# Patient Record
Sex: Male | Born: 1950 | Race: White | Hispanic: No | Marital: Single | Smoking: Never smoker
Health system: Southern US, Community
[De-identification: ages and names within clinical notes are randomized; demographics above are authoritative.]

## PROBLEM LIST (undated history)

## (undated) DIAGNOSIS — F3289 Other specified depressive episodes: Secondary | ICD-10-CM

## (undated) DIAGNOSIS — E041 Nontoxic single thyroid nodule: Secondary | ICD-10-CM

## (undated) DIAGNOSIS — R911 Solitary pulmonary nodule: Secondary | ICD-10-CM

## (undated) DIAGNOSIS — J309 Allergic rhinitis, unspecified: Secondary | ICD-10-CM

## (undated) DIAGNOSIS — H3530 Unspecified macular degeneration: Secondary | ICD-10-CM

## (undated) DIAGNOSIS — E785 Hyperlipidemia, unspecified: Secondary | ICD-10-CM

## (undated) DIAGNOSIS — E119 Type 2 diabetes mellitus without complications: Secondary | ICD-10-CM

## (undated) DIAGNOSIS — K760 Fatty (change of) liver, not elsewhere classified: Secondary | ICD-10-CM

## (undated) DIAGNOSIS — L720 Epidermal cyst: Secondary | ICD-10-CM

## (undated) DIAGNOSIS — Z860100 Personal history of colon polyps, unspecified: Secondary | ICD-10-CM

## (undated) DIAGNOSIS — I77810 Thoracic aortic ectasia: Secondary | ICD-10-CM

## (undated) DIAGNOSIS — R42 Dizziness and giddiness: Secondary | ICD-10-CM

## (undated) DIAGNOSIS — M5416 Radiculopathy, lumbar region: Principal | ICD-10-CM

## (undated) DIAGNOSIS — K589 Irritable bowel syndrome without diarrhea: Secondary | ICD-10-CM

## (undated) DIAGNOSIS — K5792 Diverticulitis of intestine, part unspecified, without perforation or abscess without bleeding: Secondary | ICD-10-CM

## (undated) DIAGNOSIS — I2510 Atherosclerotic heart disease of native coronary artery without angina pectoris: Secondary | ICD-10-CM

## (undated) DIAGNOSIS — I700 Atherosclerosis of aorta: Secondary | ICD-10-CM

## (undated) DIAGNOSIS — M109 Gout, unspecified: Secondary | ICD-10-CM

## (undated) DIAGNOSIS — N189 Chronic kidney disease, unspecified: Secondary | ICD-10-CM

## (undated) DIAGNOSIS — K219 Gastro-esophageal reflux disease without esophagitis: Secondary | ICD-10-CM

## (undated) DIAGNOSIS — J36 Peritonsillar abscess: Secondary | ICD-10-CM

## (undated) DIAGNOSIS — H9190 Unspecified hearing loss, unspecified ear: Secondary | ICD-10-CM

## (undated) DIAGNOSIS — I10 Essential (primary) hypertension: Secondary | ICD-10-CM

## (undated) DIAGNOSIS — F329 Major depressive disorder, single episode, unspecified: Secondary | ICD-10-CM

## (undated) DIAGNOSIS — Z8719 Personal history of other diseases of the digestive system: Secondary | ICD-10-CM

## (undated) DIAGNOSIS — E079 Disorder of thyroid, unspecified: Secondary | ICD-10-CM

## (undated) DIAGNOSIS — N2889 Other specified disorders of kidney and ureter: Secondary | ICD-10-CM

## (undated) DIAGNOSIS — I739 Peripheral vascular disease, unspecified: Secondary | ICD-10-CM

## (undated) DIAGNOSIS — I509 Heart failure, unspecified: Secondary | ICD-10-CM

## (undated) DIAGNOSIS — K863 Pseudocyst of pancreas: Secondary | ICD-10-CM

## (undated) DIAGNOSIS — S52133A Displaced fracture of neck of unspecified radius, initial encounter for closed fracture: Secondary | ICD-10-CM

## (undated) DIAGNOSIS — F411 Generalized anxiety disorder: Secondary | ICD-10-CM

## (undated) DIAGNOSIS — I255 Ischemic cardiomyopathy: Secondary | ICD-10-CM

## (undated) DIAGNOSIS — I219 Acute myocardial infarction, unspecified: Secondary | ICD-10-CM

## (undated) DIAGNOSIS — N321 Vesicointestinal fistula: Secondary | ICD-10-CM

## (undated) DIAGNOSIS — M1990 Unspecified osteoarthritis, unspecified site: Secondary | ICD-10-CM

## (undated) DIAGNOSIS — M4302 Spondylolysis, cervical region: Secondary | ICD-10-CM

## (undated) DIAGNOSIS — E042 Nontoxic multinodular goiter: Secondary | ICD-10-CM

## (undated) DIAGNOSIS — N281 Cyst of kidney, acquired: Secondary | ICD-10-CM

## (undated) DIAGNOSIS — R945 Abnormal results of liver function studies: Secondary | ICD-10-CM

## (undated) DIAGNOSIS — R0602 Shortness of breath: Secondary | ICD-10-CM

## (undated) HISTORY — DX: Irritable bowel syndrome, unspecified: K58.9

## (undated) HISTORY — DX: Vesicointestinal fistula: N32.1

## (undated) HISTORY — PX: HERNIA REPAIR: SHX51

## (undated) HISTORY — DX: Gout, unspecified: M10.9

## (undated) HISTORY — DX: Essential (primary) hypertension: I10

## (undated) HISTORY — DX: Generalized anxiety disorder: F41.1

## (undated) HISTORY — DX: Radiculopathy, lumbar region: M54.16

## (undated) HISTORY — DX: Gastro-esophageal reflux disease without esophagitis: K21.9

## (undated) HISTORY — DX: Allergic rhinitis, unspecified: J30.9

## (undated) HISTORY — DX: Solitary pulmonary nodule: R91.1

## (undated) HISTORY — DX: Other specified disorders of kidney and ureter: N28.89

## (undated) HISTORY — DX: Other specified depressive episodes: F32.89

## (undated) HISTORY — DX: Abnormal results of liver function studies: R94.5

## (undated) HISTORY — DX: Ischemic cardiomyopathy: I25.5

## (undated) HISTORY — DX: Heart failure, unspecified: I50.9

## (undated) HISTORY — DX: Major depressive disorder, single episode, unspecified: F32.9

## (undated) HISTORY — DX: Hyperlipidemia, unspecified: E78.5

## (undated) HISTORY — DX: Personal history of colonic polyps: Z86.010

## (undated) HISTORY — DX: Type 2 diabetes mellitus without complications: E11.9

## (undated) HISTORY — PX: SEPTOPLASTY: SUR1290

## (undated) HISTORY — PX: COLONOSCOPY: SHX174

## (undated) HISTORY — DX: Personal history of colon polyps, unspecified: Z86.0100

## (undated) HISTORY — DX: Thoracic aortic ectasia: I77.810

---

## 1999-01-18 ENCOUNTER — Ambulatory Visit (HOSPITAL_COMMUNITY): Admission: RE | Admit: 1999-01-18 | Discharge: 1999-01-18 | Payer: Self-pay | Admitting: *Deleted

## 1999-11-09 ENCOUNTER — Emergency Department (HOSPITAL_COMMUNITY): Admission: EM | Admit: 1999-11-09 | Discharge: 1999-11-10 | Payer: Self-pay | Admitting: Emergency Medicine

## 2000-04-25 ENCOUNTER — Ambulatory Visit (HOSPITAL_BASED_OUTPATIENT_CLINIC_OR_DEPARTMENT_OTHER): Admission: RE | Admit: 2000-04-25 | Discharge: 2000-04-25 | Payer: Self-pay | Admitting: General Surgery

## 2000-04-25 ENCOUNTER — Encounter (INDEPENDENT_AMBULATORY_CARE_PROVIDER_SITE_OTHER): Payer: Self-pay | Admitting: *Deleted

## 2004-09-15 ENCOUNTER — Ambulatory Visit: Payer: Self-pay | Admitting: Internal Medicine

## 2004-10-20 ENCOUNTER — Ambulatory Visit: Payer: Self-pay | Admitting: Internal Medicine

## 2005-10-02 ENCOUNTER — Ambulatory Visit: Payer: Self-pay | Admitting: Internal Medicine

## 2006-01-16 ENCOUNTER — Ambulatory Visit: Payer: Self-pay | Admitting: Internal Medicine

## 2006-01-18 ENCOUNTER — Ambulatory Visit: Payer: Self-pay | Admitting: Internal Medicine

## 2006-07-01 ENCOUNTER — Ambulatory Visit: Payer: Self-pay | Admitting: Internal Medicine

## 2006-07-01 ENCOUNTER — Ambulatory Visit (HOSPITAL_COMMUNITY): Admission: RE | Admit: 2006-07-01 | Discharge: 2006-07-01 | Payer: Self-pay | Admitting: Internal Medicine

## 2006-07-04 ENCOUNTER — Ambulatory Visit: Payer: Self-pay | Admitting: Internal Medicine

## 2006-12-20 ENCOUNTER — Ambulatory Visit: Payer: Self-pay | Admitting: Internal Medicine

## 2006-12-20 LAB — CONVERTED CEMR LAB
ALT: 60 units/L — ABNORMAL HIGH (ref 0–53)
AST: 47 units/L — ABNORMAL HIGH (ref 0–37)
Albumin: 3.6 g/dL (ref 3.5–5.2)
Alkaline Phosphatase: 118 units/L — ABNORMAL HIGH (ref 39–117)
BUN: 13 mg/dL (ref 6–23)
Basophils Absolute: 0 10*3/uL (ref 0.0–0.1)
Basophils Relative: 0.6 % (ref 0.0–1.0)
Bilirubin, Direct: 0.3 mg/dL (ref 0.0–0.3)
CO2: 33 meq/L — ABNORMAL HIGH (ref 19–32)
Calcium: 9.6 mg/dL (ref 8.4–10.5)
Chloride: 105 meq/L (ref 96–112)
Cholesterol: 129 mg/dL (ref 0–200)
Creatinine, Ser: 1.1 mg/dL (ref 0.4–1.5)
Eosinophils Absolute: 0.3 10*3/uL (ref 0.0–0.6)
Eosinophils Relative: 3.6 % (ref 0.0–5.0)
GFR calc Af Amer: 89 mL/min
GFR calc non Af Amer: 74 mL/min
Glucose, Bld: 196 mg/dL — ABNORMAL HIGH (ref 70–99)
HCT: 45.4 % (ref 39.0–52.0)
HDL: 32.3 mg/dL — ABNORMAL LOW (ref >39.0)
Hemoglobin: 15.8 g/dL (ref 13.0–17.0)
Hgb A1c MFr Bld: 7.3 % — ABNORMAL HIGH (ref 4.6–6.0)
LDL Cholesterol: 68 mg/dL (ref 0–99)
Lymphocytes Relative: 31.1 % (ref 12.0–46.0)
MCHC: 34.9 g/dL (ref 30.0–36.0)
MCV: 88.8 fL (ref 78.0–100.0)
Monocytes Absolute: 0.6 10*3/uL (ref 0.2–0.7)
Monocytes Relative: 7.2 % (ref 3.0–11.0)
Neutro Abs: 4.7 10*3/uL (ref 1.4–7.7)
Neutrophils Relative %: 57.5 % (ref 43.0–77.0)
PSA: 2.3 ng/mL (ref 0.10–4.00)
Platelets: 190 10*3/uL (ref 150–400)
Potassium: 4.3 meq/L (ref 3.5–5.1)
RBC: 5.12 M/uL (ref 4.22–5.81)
RDW: 12.6 % (ref 11.5–14.6)
Sodium: 144 meq/L (ref 135–145)
TSH: 1.49 microintl units/mL (ref 0.35–5.50)
Total Bilirubin: 1.4 mg/dL — ABNORMAL HIGH (ref 0.3–1.2)
Total CHOL/HDL Ratio: 4
Total Protein: 7 g/dL (ref 6.0–8.3)
Triglycerides: 143 mg/dL (ref 0–149)
VLDL: 29 mg/dL (ref 0–40)
WBC: 8.1 10*3/uL (ref 4.5–10.5)

## 2007-03-24 ENCOUNTER — Ambulatory Visit (HOSPITAL_COMMUNITY): Admission: RE | Admit: 2007-03-24 | Discharge: 2007-03-24 | Payer: Self-pay | Admitting: Gastroenterology

## 2007-03-24 ENCOUNTER — Encounter: Payer: Self-pay | Admitting: Internal Medicine

## 2007-06-25 ENCOUNTER — Ambulatory Visit: Payer: Self-pay | Admitting: Internal Medicine

## 2007-06-25 DIAGNOSIS — M109 Gout, unspecified: Secondary | ICD-10-CM | POA: Insufficient documentation

## 2007-06-25 DIAGNOSIS — K219 Gastro-esophageal reflux disease without esophagitis: Secondary | ICD-10-CM | POA: Insufficient documentation

## 2007-06-25 DIAGNOSIS — Z860100 Personal history of colon polyps, unspecified: Secondary | ICD-10-CM | POA: Insufficient documentation

## 2007-06-25 DIAGNOSIS — J309 Allergic rhinitis, unspecified: Secondary | ICD-10-CM | POA: Insufficient documentation

## 2007-06-25 DIAGNOSIS — K589 Irritable bowel syndrome without diarrhea: Secondary | ICD-10-CM | POA: Insufficient documentation

## 2007-06-25 DIAGNOSIS — E1129 Type 2 diabetes mellitus with other diabetic kidney complication: Secondary | ICD-10-CM | POA: Insufficient documentation

## 2007-06-25 DIAGNOSIS — I10 Essential (primary) hypertension: Secondary | ICD-10-CM | POA: Insufficient documentation

## 2007-06-25 DIAGNOSIS — F329 Major depressive disorder, single episode, unspecified: Secondary | ICD-10-CM | POA: Insufficient documentation

## 2007-06-25 DIAGNOSIS — R21 Rash and other nonspecific skin eruption: Secondary | ICD-10-CM | POA: Insufficient documentation

## 2007-06-25 DIAGNOSIS — F411 Generalized anxiety disorder: Secondary | ICD-10-CM | POA: Insufficient documentation

## 2007-06-25 DIAGNOSIS — J019 Acute sinusitis, unspecified: Secondary | ICD-10-CM | POA: Insufficient documentation

## 2007-06-25 DIAGNOSIS — E785 Hyperlipidemia, unspecified: Secondary | ICD-10-CM | POA: Insufficient documentation

## 2007-07-01 LAB — CONVERTED CEMR LAB
BUN: 15 mg/dL (ref 6–23)
CO2: 32 meq/L (ref 19–32)
Calcium: 9.6 mg/dL (ref 8.4–10.5)
Chloride: 102 meq/L (ref 96–112)
Cholesterol: 126 mg/dL (ref 0–200)
Creatinine, Ser: 1.1 mg/dL (ref 0.4–1.5)
GFR calc Af Amer: 89 mL/min
GFR calc non Af Amer: 73 mL/min
Glucose, Bld: 129 mg/dL — ABNORMAL HIGH (ref 70–99)
HDL: 26.5 mg/dL — ABNORMAL LOW (ref >39.0)
Hgb A1c MFr Bld: 7 % — ABNORMAL HIGH (ref 4.6–6.0)
LDL Cholesterol: 69 mg/dL (ref 0–99)
Potassium: 3.7 meq/L (ref 3.5–5.1)
Sodium: 141 meq/L (ref 135–145)
Total CHOL/HDL Ratio: 4.8
Triglycerides: 155 mg/dL — ABNORMAL HIGH (ref 0–149)
VLDL: 31 mg/dL (ref 0–40)

## 2007-08-01 ENCOUNTER — Ambulatory Visit: Payer: Self-pay | Admitting: Internal Medicine

## 2007-08-01 ENCOUNTER — Encounter (INDEPENDENT_AMBULATORY_CARE_PROVIDER_SITE_OTHER): Payer: Self-pay | Admitting: *Deleted

## 2007-08-01 DIAGNOSIS — L720 Epidermal cyst: Secondary | ICD-10-CM | POA: Insufficient documentation

## 2007-09-25 ENCOUNTER — Encounter: Payer: Self-pay | Admitting: Internal Medicine

## 2007-10-23 ENCOUNTER — Ambulatory Visit: Payer: Self-pay | Admitting: Internal Medicine

## 2007-10-23 ENCOUNTER — Ambulatory Visit (HOSPITAL_BASED_OUTPATIENT_CLINIC_OR_DEPARTMENT_OTHER): Admission: RE | Admit: 2007-10-23 | Discharge: 2007-10-23 | Payer: Self-pay | Admitting: General Surgery

## 2008-03-18 ENCOUNTER — Ambulatory Visit: Payer: Self-pay | Admitting: Internal Medicine

## 2008-03-18 LAB — CONVERTED CEMR LAB
ALT: 61 units/L — ABNORMAL HIGH (ref 0–53)
AST: 56 units/L — ABNORMAL HIGH (ref 0–37)
Albumin: 3.3 g/dL — ABNORMAL LOW (ref 3.5–5.2)
Alkaline Phosphatase: 139 units/L — ABNORMAL HIGH (ref 39–117)
BUN: 16 mg/dL (ref 6–23)
Bacteria, UA: NEGATIVE
Basophils Absolute: 0.1 10*3/uL (ref 0.0–0.1)
Basophils Relative: 1.1 % (ref 0.0–3.0)
Bilirubin Urine: NEGATIVE
Bilirubin, Direct: 0.2 mg/dL (ref 0.0–0.3)
CO2: 32 meq/L (ref 19–32)
Calcium: 9 mg/dL (ref 8.4–10.5)
Chloride: 103 meq/L (ref 96–112)
Cholesterol: 134 mg/dL (ref 0–200)
Creatinine, Ser: 1 mg/dL (ref 0.4–1.5)
Crystals: NEGATIVE
Direct LDL: 59.3 mg/dL
Eosinophils Absolute: 0.7 10*3/uL (ref 0.0–0.7)
Eosinophils Relative: 8.3 % — ABNORMAL HIGH (ref 0.0–5.0)
GFR calc Af Amer: 99 mL/min
GFR calc non Af Amer: 82 mL/min
Glucose, Bld: 193 mg/dL — ABNORMAL HIGH (ref 70–99)
HCT: 45.4 % (ref 39.0–52.0)
HDL: 27.4 mg/dL — ABNORMAL LOW (ref >39.0)
Hemoglobin, Urine: NEGATIVE
Hemoglobin: 16 g/dL (ref 13.0–17.0)
Hgb A1c MFr Bld: 8 % — ABNORMAL HIGH (ref 4.6–6.0)
Ketones, ur: NEGATIVE mg/dL
Leukocytes, UA: NEGATIVE
Lymphocytes Relative: 29.9 % (ref 12.0–46.0)
MCHC: 35.2 g/dL (ref 30.0–36.0)
MCV: 89.1 fL (ref 78.0–100.0)
Monocytes Absolute: 0.6 10*3/uL (ref 0.1–1.0)
Monocytes Relative: 8 % (ref 3.0–12.0)
Mucus, UA: NEGATIVE
Neutro Abs: 4.1 10*3/uL (ref 1.4–7.7)
Neutrophils Relative %: 52.7 % (ref 43.0–77.0)
Nitrite: NEGATIVE
PSA: 1.64 ng/mL (ref 0.10–4.00)
Platelets: 146 10*3/uL — ABNORMAL LOW (ref 150–400)
Potassium: 3.8 meq/L (ref 3.5–5.1)
RBC: 5.09 M/uL (ref 4.22–5.81)
RDW: 12.3 % (ref 11.5–14.6)
Sodium: 142 meq/L (ref 135–145)
Specific Gravity, Urine: 1.02 (ref 1.000–1.03)
TSH: 2.08 microintl units/mL (ref 0.35–5.50)
Total Bilirubin: 0.9 mg/dL (ref 0.3–1.2)
Total CHOL/HDL Ratio: 4.9
Total Protein, Urine: 30 mg/dL — AB
Total Protein: 6.7 g/dL (ref 6.0–8.3)
Triglycerides: 208 mg/dL (ref 0–149)
Urine Glucose: NEGATIVE mg/dL
Urobilinogen, UA: 1 (ref 0.0–1.0)
VLDL: 42 mg/dL — ABNORMAL HIGH (ref 0–40)
WBC: 7.9 10*3/uL (ref 4.5–10.5)
pH: 6 (ref 5.0–8.0)

## 2008-03-29 ENCOUNTER — Ambulatory Visit: Payer: Self-pay | Admitting: Internal Medicine

## 2008-03-29 DIAGNOSIS — B070 Plantar wart: Secondary | ICD-10-CM | POA: Insufficient documentation

## 2008-03-29 DIAGNOSIS — R9431 Abnormal electrocardiogram [ECG] [EKG]: Secondary | ICD-10-CM | POA: Insufficient documentation

## 2008-04-06 ENCOUNTER — Telehealth (INDEPENDENT_AMBULATORY_CARE_PROVIDER_SITE_OTHER): Payer: Self-pay | Admitting: *Deleted

## 2008-04-06 ENCOUNTER — Encounter: Payer: Self-pay | Admitting: Internal Medicine

## 2008-04-19 ENCOUNTER — Encounter: Payer: Self-pay | Admitting: Internal Medicine

## 2008-04-19 ENCOUNTER — Ambulatory Visit: Payer: Self-pay

## 2008-06-18 ENCOUNTER — Encounter: Admission: RE | Admit: 2008-06-18 | Discharge: 2008-06-18 | Payer: Self-pay | Admitting: Gastroenterology

## 2008-08-23 ENCOUNTER — Encounter (INDEPENDENT_AMBULATORY_CARE_PROVIDER_SITE_OTHER): Payer: Self-pay | Admitting: Gastroenterology

## 2008-08-23 ENCOUNTER — Encounter: Payer: Self-pay | Admitting: Internal Medicine

## 2008-08-23 ENCOUNTER — Ambulatory Visit (HOSPITAL_COMMUNITY): Admission: RE | Admit: 2008-08-23 | Discharge: 2008-08-23 | Payer: Self-pay | Admitting: Gastroenterology

## 2008-10-11 ENCOUNTER — Ambulatory Visit: Payer: Self-pay | Admitting: Internal Medicine

## 2008-10-11 LAB — CONVERTED CEMR LAB
BUN: 17 mg/dL (ref 6–23)
CO2: 32 meq/L (ref 19–32)
Calcium: 8.8 mg/dL (ref 8.4–10.5)
Chloride: 104 meq/L (ref 96–112)
Cholesterol: 130 mg/dL (ref 0–200)
Creatinine, Ser: 0.9 mg/dL (ref 0.4–1.5)
Direct LDL: 69 mg/dL
GFR calc non Af Amer: 91.96 mL/min (ref >60)
Glucose, Bld: 201 mg/dL — ABNORMAL HIGH (ref 70–99)
HDL: 26.3 mg/dL — ABNORMAL LOW (ref >39.00)
Hgb A1c MFr Bld: 8.1 % — ABNORMAL HIGH (ref 4.6–6.5)
Potassium: 3.4 meq/L — ABNORMAL LOW (ref 3.5–5.1)
Sodium: 142 meq/L (ref 135–145)
Total CHOL/HDL Ratio: 5
Triglycerides: 263 mg/dL — ABNORMAL HIGH (ref 0.0–149.0)
VLDL: 52.6 mg/dL — ABNORMAL HIGH (ref 0.0–40.0)

## 2008-10-12 ENCOUNTER — Ambulatory Visit: Payer: Self-pay | Admitting: Internal Medicine

## 2008-10-12 DIAGNOSIS — E876 Hypokalemia: Secondary | ICD-10-CM | POA: Insufficient documentation

## 2009-01-20 ENCOUNTER — Ambulatory Visit: Payer: Self-pay | Admitting: Internal Medicine

## 2009-01-20 DIAGNOSIS — H918X9 Other specified hearing loss, unspecified ear: Secondary | ICD-10-CM | POA: Insufficient documentation

## 2009-03-21 ENCOUNTER — Encounter: Payer: Self-pay | Admitting: Internal Medicine

## 2009-03-23 ENCOUNTER — Telehealth: Payer: Self-pay | Admitting: Internal Medicine

## 2009-03-28 ENCOUNTER — Ambulatory Visit: Payer: Self-pay | Admitting: Internal Medicine

## 2009-03-28 LAB — CONVERTED CEMR LAB
ALT: 66 units/L — ABNORMAL HIGH (ref 0–53)
AST: 51 units/L — ABNORMAL HIGH (ref 0–37)
Albumin: 3.4 g/dL — ABNORMAL LOW (ref 3.5–5.2)
Alkaline Phosphatase: 134 units/L — ABNORMAL HIGH (ref 39–117)
BUN: 14 mg/dL (ref 6–23)
Basophils Absolute: 0.1 10*3/uL (ref 0.0–0.1)
Basophils Relative: 1.2 % (ref 0.0–3.0)
Bilirubin Urine: NEGATIVE
Bilirubin, Direct: 0.2 mg/dL (ref 0.0–0.3)
CO2: 33 meq/L — ABNORMAL HIGH (ref 19–32)
Calcium: 9.3 mg/dL (ref 8.4–10.5)
Chloride: 102 meq/L (ref 96–112)
Cholesterol: 129 mg/dL (ref 0–200)
Creatinine, Ser: 0.9 mg/dL (ref 0.4–1.5)
Creatinine,U: 102.1 mg/dL
Direct LDL: 61.4 mg/dL
Eosinophils Absolute: 0.3 10*3/uL (ref 0.0–0.7)
Eosinophils Relative: 3.7 % (ref 0.0–5.0)
GFR calc non Af Amer: 91.82 mL/min (ref >60)
Glucose, Bld: 193 mg/dL — ABNORMAL HIGH (ref 70–99)
HCT: 42.5 % (ref 39.0–52.0)
HDL: 30.6 mg/dL — ABNORMAL LOW (ref >39.00)
Hemoglobin: 15.1 g/dL (ref 13.0–17.0)
Hgb A1c MFr Bld: 9.8 % — ABNORMAL HIGH (ref 4.6–6.5)
Ketones, ur: NEGATIVE mg/dL
Leukocytes, UA: NEGATIVE
Lymphocytes Relative: 31.7 % (ref 12.0–46.0)
Lymphs Abs: 2.4 10*3/uL (ref 0.7–4.0)
MCHC: 35.6 g/dL (ref 30.0–36.0)
MCV: 89.8 fL (ref 78.0–100.0)
Microalb Creat Ratio: 290.9 mg/g — ABNORMAL HIGH (ref 0.0–30.0)
Microalb, Ur: 29.7 mg/dL — ABNORMAL HIGH (ref 0.0–1.9)
Monocytes Absolute: 0.6 10*3/uL (ref 0.1–1.0)
Monocytes Relative: 8 % (ref 3.0–12.0)
Neutro Abs: 4.1 10*3/uL (ref 1.4–7.7)
Neutrophils Relative %: 55.4 % (ref 43.0–77.0)
Nitrite: NEGATIVE
PSA: 1.42 ng/mL (ref 0.10–4.00)
Platelets: 141 10*3/uL — ABNORMAL LOW (ref 150.0–400.0)
Potassium: 3.1 meq/L — ABNORMAL LOW (ref 3.5–5.1)
RBC: 4.74 M/uL (ref 4.22–5.81)
RDW: 12.1 % (ref 11.5–14.6)
Sodium: 143 meq/L (ref 135–145)
Specific Gravity, Urine: 1.02 (ref 1.000–1.030)
TSH: 2.41 microintl units/mL (ref 0.35–5.50)
Total Bilirubin: 1.1 mg/dL (ref 0.3–1.2)
Total CHOL/HDL Ratio: 4
Total Protein, Urine: 100 mg/dL
Total Protein: 7.1 g/dL (ref 6.0–8.3)
Triglycerides: 254 mg/dL — ABNORMAL HIGH (ref 0.0–149.0)
Urine Glucose: 100 mg/dL
Urobilinogen, UA: 1 (ref 0.0–1.0)
VLDL: 50.8 mg/dL — ABNORMAL HIGH (ref 0.0–40.0)
WBC: 7.5 10*3/uL (ref 4.5–10.5)
pH: 6 (ref 5.0–8.0)

## 2009-03-29 ENCOUNTER — Encounter: Payer: Self-pay | Admitting: Internal Medicine

## 2009-03-29 LAB — CONVERTED CEMR LAB
HCV Ab: NEGATIVE
Hep A IgM: NEGATIVE
Hep B C IgM: NEGATIVE
Hepatitis B Surface Ag: NEGATIVE

## 2009-03-30 ENCOUNTER — Ambulatory Visit: Payer: Self-pay | Admitting: Internal Medicine

## 2009-03-30 DIAGNOSIS — R740 Nonspecific elevation of levels of transaminase and lactic acid dehydrogenase [LDH]: Secondary | ICD-10-CM

## 2009-03-30 DIAGNOSIS — R7402 Elevation of levels of lactic acid dehydrogenase (LDH): Secondary | ICD-10-CM | POA: Insufficient documentation

## 2009-04-08 ENCOUNTER — Ambulatory Visit: Payer: Self-pay | Admitting: Internal Medicine

## 2009-04-08 LAB — CONVERTED CEMR LAB

## 2009-04-20 ENCOUNTER — Ambulatory Visit (HOSPITAL_COMMUNITY): Admission: RE | Admit: 2009-04-20 | Discharge: 2009-04-20 | Payer: Self-pay | Admitting: Internal Medicine

## 2009-04-20 ENCOUNTER — Encounter: Payer: Self-pay | Admitting: Internal Medicine

## 2009-04-20 ENCOUNTER — Ambulatory Visit: Payer: Self-pay | Admitting: Cardiology

## 2009-04-20 ENCOUNTER — Ambulatory Visit: Payer: Self-pay

## 2009-06-30 ENCOUNTER — Encounter: Admission: RE | Admit: 2009-06-30 | Discharge: 2009-06-30 | Payer: Self-pay | Admitting: Internal Medicine

## 2009-07-01 ENCOUNTER — Encounter: Payer: Self-pay | Admitting: Internal Medicine

## 2009-07-06 ENCOUNTER — Ambulatory Visit: Payer: Self-pay | Admitting: Internal Medicine

## 2009-07-07 ENCOUNTER — Telehealth: Payer: Self-pay | Admitting: Internal Medicine

## 2009-09-29 ENCOUNTER — Ambulatory Visit: Payer: Self-pay | Admitting: Internal Medicine

## 2009-09-29 ENCOUNTER — Ambulatory Visit (HOSPITAL_COMMUNITY): Admission: RE | Admit: 2009-09-29 | Discharge: 2009-09-29 | Payer: Self-pay | Admitting: Gastroenterology

## 2009-09-29 DIAGNOSIS — M722 Plantar fascial fibromatosis: Secondary | ICD-10-CM | POA: Insufficient documentation

## 2009-09-29 LAB — HM COLONOSCOPY

## 2009-09-30 ENCOUNTER — Telehealth: Payer: Self-pay | Admitting: Internal Medicine

## 2009-10-03 ENCOUNTER — Ambulatory Visit: Payer: Self-pay | Admitting: Internal Medicine

## 2009-10-04 ENCOUNTER — Encounter: Payer: Self-pay | Admitting: Internal Medicine

## 2009-10-10 LAB — CONVERTED CEMR LAB
Metaneph Total, Ur: 367 ug/24hr (ref 224–832)
Metanephrines, Ur: 62 — ABNORMAL LOW (ref 90–315)
Normetanephrine, 24H Ur: 305 (ref 122–676)

## 2009-10-13 ENCOUNTER — Encounter: Admission: RE | Admit: 2009-10-13 | Discharge: 2009-10-13 | Payer: Self-pay | Admitting: Internal Medicine

## 2009-10-13 ENCOUNTER — Encounter: Payer: Self-pay | Admitting: Internal Medicine

## 2009-10-13 DIAGNOSIS — N289 Disorder of kidney and ureter, unspecified: Secondary | ICD-10-CM | POA: Insufficient documentation

## 2009-10-22 ENCOUNTER — Ambulatory Visit (HOSPITAL_COMMUNITY): Admission: RE | Admit: 2009-10-22 | Discharge: 2009-10-22 | Payer: Self-pay | Admitting: Internal Medicine

## 2009-10-22 ENCOUNTER — Encounter: Payer: Self-pay | Admitting: Internal Medicine

## 2009-11-04 ENCOUNTER — Ambulatory Visit: Payer: Self-pay | Admitting: Internal Medicine

## 2009-11-04 DIAGNOSIS — R609 Edema, unspecified: Secondary | ICD-10-CM | POA: Insufficient documentation

## 2009-11-16 ENCOUNTER — Encounter: Payer: Self-pay | Admitting: Internal Medicine

## 2009-11-17 ENCOUNTER — Telehealth: Payer: Self-pay | Admitting: Internal Medicine

## 2009-12-06 ENCOUNTER — Encounter: Payer: Self-pay | Admitting: Internal Medicine

## 2010-02-14 ENCOUNTER — Encounter (INDEPENDENT_AMBULATORY_CARE_PROVIDER_SITE_OTHER): Payer: Self-pay | Admitting: *Deleted

## 2010-02-14 ENCOUNTER — Telehealth: Payer: Self-pay | Admitting: Family Medicine

## 2010-02-14 ENCOUNTER — Ambulatory Visit: Payer: Self-pay | Admitting: Internal Medicine

## 2010-02-14 DIAGNOSIS — R109 Unspecified abdominal pain: Secondary | ICD-10-CM | POA: Insufficient documentation

## 2010-02-14 DIAGNOSIS — R197 Diarrhea, unspecified: Secondary | ICD-10-CM | POA: Insufficient documentation

## 2010-02-14 LAB — CONVERTED CEMR LAB
ALT: 51 units/L (ref 0–53)
AST: 44 units/L — ABNORMAL HIGH (ref 0–37)
Albumin: 3.5 g/dL (ref 3.5–5.2)
Alkaline Phosphatase: 96 units/L (ref 39–117)
BUN: 24 mg/dL — ABNORMAL HIGH (ref 6–23)
Basophils Absolute: 0.1 10*3/uL (ref 0.0–0.1)
Basophils Relative: 0.3 % (ref 0.0–3.0)
Bilirubin, Direct: 0.6 mg/dL — ABNORMAL HIGH (ref 0.0–0.3)
CO2: 31 meq/L (ref 19–32)
Calcium: 8.6 mg/dL (ref 8.4–10.5)
Chloride: 104 meq/L (ref 96–112)
Creatinine, Ser: 1.6 mg/dL — ABNORMAL HIGH (ref 0.4–1.5)
Eosinophils Absolute: 0 10*3/uL (ref 0.0–0.7)
Eosinophils Relative: 0 % (ref 0.0–5.0)
GFR calc non Af Amer: 48.17 mL/min (ref >60)
Glucose, Bld: 124 mg/dL — ABNORMAL HIGH (ref 70–99)
HCT: 43.8 % (ref 39.0–52.0)
Hemoglobin, Urine: NEGATIVE
Hemoglobin: 15.1 g/dL (ref 13.0–17.0)
Leukocytes, UA: NEGATIVE
Lymphocytes Relative: 9.1 % — ABNORMAL LOW (ref 12.0–46.0)
Lymphs Abs: 1.7 10*3/uL (ref 0.7–4.0)
MCHC: 34.6 g/dL (ref 30.0–36.0)
MCV: 93.1 fL (ref 78.0–100.0)
Monocytes Absolute: 1.3 10*3/uL — ABNORMAL HIGH (ref 0.1–1.0)
Monocytes Relative: 7 % (ref 3.0–12.0)
Neutro Abs: 15.7 10*3/uL — ABNORMAL HIGH (ref 1.4–7.7)
Neutrophils Relative %: 83.6 % — ABNORMAL HIGH (ref 43.0–77.0)
Nitrite: NEGATIVE
Platelets: 142 10*3/uL — ABNORMAL LOW (ref 150.0–400.0)
Potassium: 3.5 meq/L (ref 3.5–5.1)
RBC: 4.71 M/uL (ref 4.22–5.81)
RDW: 13.4 % (ref 11.5–14.6)
Sodium: 143 meq/L (ref 135–145)
Specific Gravity, Urine: >=1.03 (ref 1.000–1.030)
Total Bilirubin: 2.5 mg/dL — ABNORMAL HIGH (ref 0.3–1.2)
Total Protein, Urine: 100 mg/dL
Total Protein: 6.7 g/dL (ref 6.0–8.3)
Urine Glucose: NEGATIVE mg/dL
Urobilinogen, UA: 4 (ref 0.0–1.0)
WBC: 18.8 10*3/uL (ref 4.5–10.5)
pH: 5 (ref 5.0–8.0)

## 2010-02-15 ENCOUNTER — Encounter: Payer: Self-pay | Admitting: Internal Medicine

## 2010-02-15 ENCOUNTER — Ambulatory Visit: Payer: Self-pay | Admitting: Cardiology

## 2010-02-15 ENCOUNTER — Telehealth: Payer: Self-pay | Admitting: Internal Medicine

## 2010-02-15 DIAGNOSIS — K5732 Diverticulitis of large intestine without perforation or abscess without bleeding: Secondary | ICD-10-CM | POA: Insufficient documentation

## 2010-02-17 ENCOUNTER — Ambulatory Visit: Payer: Self-pay | Admitting: Internal Medicine

## 2010-02-17 LAB — CONVERTED CEMR LAB: Blood Glucose, Fingerstick: 156

## 2010-03-02 ENCOUNTER — Encounter: Payer: Self-pay | Admitting: Internal Medicine

## 2010-03-08 ENCOUNTER — Encounter: Admission: RE | Admit: 2010-03-08 | Discharge: 2010-03-08 | Payer: Self-pay | Admitting: Gastroenterology

## 2010-03-08 ENCOUNTER — Encounter: Payer: Self-pay | Admitting: Internal Medicine

## 2010-03-10 ENCOUNTER — Encounter: Payer: Self-pay | Admitting: Internal Medicine

## 2010-03-13 ENCOUNTER — Encounter: Payer: Self-pay | Admitting: Internal Medicine

## 2010-04-03 ENCOUNTER — Ambulatory Visit: Payer: Self-pay | Admitting: Internal Medicine

## 2010-04-03 LAB — CONVERTED CEMR LAB
ALT: 31 units/L (ref 0–53)
AST: 32 units/L (ref 0–37)
Albumin: 3.6 g/dL (ref 3.5–5.2)
Alkaline Phosphatase: 54 units/L (ref 39–117)
BUN: 26 mg/dL — ABNORMAL HIGH (ref 6–23)
Basophils Absolute: 0 10*3/uL (ref 0.0–0.1)
Basophils Relative: 0.6 % (ref 0.0–3.0)
Bilirubin Urine: NEGATIVE
Bilirubin, Direct: 0.2 mg/dL (ref 0.0–0.3)
CO2: 28 meq/L (ref 19–32)
Calcium: 9.1 mg/dL (ref 8.4–10.5)
Chloride: 105 meq/L (ref 96–112)
Cholesterol: 100 mg/dL (ref 0–200)
Creatinine, Ser: 1.1 mg/dL (ref 0.4–1.5)
Creatinine,U: 61.5 mg/dL
Eosinophils Absolute: 0.2 10*3/uL (ref 0.0–0.7)
Eosinophils Relative: 2.6 % (ref 0.0–5.0)
GFR calc non Af Amer: 69.65 mL/min (ref >60)
Glucose, Bld: 122 mg/dL — ABNORMAL HIGH (ref 70–99)
HCT: 40.7 % (ref 39.0–52.0)
HDL: 29.5 mg/dL — ABNORMAL LOW (ref >39.00)
Hemoglobin, Urine: NEGATIVE
Hemoglobin: 14 g/dL (ref 13.0–17.0)
Hgb A1c MFr Bld: 6 % (ref 4.6–6.5)
Ketones, ur: NEGATIVE mg/dL
LDL Cholesterol: 32 mg/dL (ref 0–99)
Leukocytes, UA: NEGATIVE
Lymphocytes Relative: 31.2 % (ref 12.0–46.0)
Lymphs Abs: 2.2 10*3/uL (ref 0.7–4.0)
MCHC: 34.3 g/dL (ref 30.0–36.0)
MCV: 93.5 fL (ref 78.0–100.0)
Microalb Creat Ratio: 7 mg/g (ref 0.0–30.0)
Microalb, Ur: 4.3 mg/dL — ABNORMAL HIGH (ref 0.0–1.9)
Monocytes Absolute: 0.6 10*3/uL (ref 0.1–1.0)
Monocytes Relative: 8.8 % (ref 3.0–12.0)
Neutro Abs: 4 10*3/uL (ref 1.4–7.7)
Neutrophils Relative %: 56.8 % (ref 43.0–77.0)
Nitrite: NEGATIVE
PSA: 1.97 ng/mL (ref 0.10–4.00)
Platelets: 162 10*3/uL (ref 150.0–400.0)
Potassium: 3.9 meq/L (ref 3.5–5.1)
RBC: 4.36 M/uL (ref 4.22–5.81)
RDW: 15.3 % — ABNORMAL HIGH (ref 11.5–14.6)
Sodium: 141 meq/L (ref 135–145)
Specific Gravity, Urine: 1.02 (ref 1.000–1.030)
TSH: 1.98 microintl units/mL (ref 0.35–5.50)
Total Bilirubin: 0.8 mg/dL (ref 0.3–1.2)
Total CHOL/HDL Ratio: 3
Total Protein, Urine: NEGATIVE mg/dL
Total Protein: 6.2 g/dL (ref 6.0–8.3)
Triglycerides: 192 mg/dL — ABNORMAL HIGH (ref 0.0–149.0)
Urine Glucose: NEGATIVE mg/dL
Urobilinogen, UA: 0.2 (ref 0.0–1.0)
VLDL: 38.4 mg/dL (ref 0.0–40.0)
WBC: 7 10*3/uL (ref 4.5–10.5)
pH: 5 (ref 5.0–8.0)

## 2010-04-07 ENCOUNTER — Encounter: Payer: Self-pay | Admitting: Internal Medicine

## 2010-04-07 ENCOUNTER — Ambulatory Visit: Payer: Self-pay | Admitting: Internal Medicine

## 2010-04-07 DIAGNOSIS — M25469 Effusion, unspecified knee: Secondary | ICD-10-CM | POA: Insufficient documentation

## 2010-04-17 ENCOUNTER — Telehealth (INDEPENDENT_AMBULATORY_CARE_PROVIDER_SITE_OTHER): Payer: Self-pay | Admitting: Radiology

## 2010-04-18 ENCOUNTER — Encounter: Payer: Self-pay | Admitting: *Deleted

## 2010-04-18 ENCOUNTER — Encounter: Payer: Self-pay | Admitting: Internal Medicine

## 2010-04-18 ENCOUNTER — Ambulatory Visit: Payer: Self-pay | Admitting: Cardiology

## 2010-04-18 ENCOUNTER — Ambulatory Visit: Payer: Self-pay

## 2010-04-18 ENCOUNTER — Encounter (HOSPITAL_COMMUNITY)
Admission: RE | Admit: 2010-04-18 | Discharge: 2010-06-20 | Payer: Self-pay | Source: Home / Self Care | Attending: Internal Medicine | Admitting: Internal Medicine

## 2010-04-20 ENCOUNTER — Encounter: Admission: RE | Admit: 2010-04-20 | Discharge: 2010-04-20 | Payer: Self-pay | Admitting: General Surgery

## 2010-04-27 ENCOUNTER — Telehealth (INDEPENDENT_AMBULATORY_CARE_PROVIDER_SITE_OTHER): Payer: Self-pay | Admitting: *Deleted

## 2010-05-02 ENCOUNTER — Ambulatory Visit: Payer: Self-pay | Admitting: Internal Medicine

## 2010-05-02 DIAGNOSIS — R300 Dysuria: Secondary | ICD-10-CM | POA: Insufficient documentation

## 2010-05-02 DIAGNOSIS — R3911 Hesitancy of micturition: Secondary | ICD-10-CM | POA: Insufficient documentation

## 2010-05-02 LAB — CONVERTED CEMR LAB
Blood, UA: NEGATIVE
Ketones, ur: NEGATIVE mg/dL
Nitrite: NEGATIVE
Specific Gravity, Urine: 1.02 (ref 1.000–1.030)
Total Protein, Urine: 100 mg/dL
Urine Glucose: NEGATIVE mg/dL
Urobilinogen, UA: 0.2 (ref 0.0–1.0)
pH: 5.5 (ref 5.0–8.0)

## 2010-05-03 ENCOUNTER — Encounter: Payer: Self-pay | Admitting: Internal Medicine

## 2010-05-23 ENCOUNTER — Ambulatory Visit
Admission: RE | Admit: 2010-05-23 | Discharge: 2010-05-23 | Payer: Self-pay | Source: Home / Self Care | Attending: Internal Medicine | Admitting: Internal Medicine

## 2010-05-23 ENCOUNTER — Other Ambulatory Visit: Payer: Self-pay | Admitting: Internal Medicine

## 2010-05-23 LAB — URINALYSIS, ROUTINE W REFLEX MICROSCOPIC
Bilirubin Urine: NEGATIVE
Ketones, ur: NEGATIVE
Nitrite: NEGATIVE
Specific Gravity, Urine: 1.01 (ref 1.000–1.030)
Total Protein, Urine: NEGATIVE
Urine Glucose: NEGATIVE
Urobilinogen, UA: 0.2 (ref 0.0–1.0)
pH: 5 (ref 5.0–8.0)

## 2010-06-05 ENCOUNTER — Ambulatory Visit (HOSPITAL_COMMUNITY)
Admission: RE | Admit: 2010-06-05 | Discharge: 2010-06-05 | Payer: Self-pay | Source: Home / Self Care | Attending: Urology | Admitting: Urology

## 2010-06-09 ENCOUNTER — Encounter: Payer: Self-pay | Admitting: Internal Medicine

## 2010-06-14 ENCOUNTER — Encounter: Payer: Self-pay | Admitting: Internal Medicine

## 2010-06-18 LAB — CONVERTED CEMR LAB
BUN: 18 mg/dL (ref 6–23)
Basophils Absolute: 0 10*3/uL (ref 0.0–0.1)
Basophils Relative: 0.5 % (ref 0.0–3.0)
CO2: 31 meq/L (ref 19–32)
Calcium: 9 mg/dL (ref 8.4–10.5)
Chloride: 105 meq/L (ref 96–112)
Creatinine, Ser: 1.1 mg/dL (ref 0.4–1.5)
Eosinophils Absolute: 0.2 10*3/uL (ref 0.0–0.7)
Eosinophils Relative: 2.2 % (ref 0.0–5.0)
GFR calc non Af Amer: 71.95 mL/min (ref >60)
Glucose, Bld: 95 mg/dL (ref 70–99)
HCT: 45.7 % (ref 39.0–52.0)
Hemoglobin: 15.9 g/dL (ref 13.0–17.0)
Hgb A1c MFr Bld: 6 % (ref 4.6–6.5)
Lymphocytes Relative: 32.3 % (ref 12.0–46.0)
Lymphs Abs: 2.7 10*3/uL (ref 0.7–4.0)
MCHC: 34.7 g/dL (ref 30.0–36.0)
MCV: 91.4 fL (ref 78.0–100.0)
Monocytes Absolute: 0.7 10*3/uL (ref 0.1–1.0)
Monocytes Relative: 8.5 % (ref 3.0–12.0)
Neutro Abs: 4.7 10*3/uL (ref 1.4–7.7)
Neutrophils Relative %: 56.5 % (ref 43.0–77.0)
Platelets: 174 10*3/uL (ref 150.0–400.0)
Potassium: 3.7 meq/L (ref 3.5–5.1)
RBC: 5 M/uL (ref 4.22–5.81)
RDW: 14.3 % (ref 11.5–14.6)
Sodium: 145 meq/L (ref 135–145)
WBC: 8.3 10*3/uL (ref 4.5–10.5)

## 2010-06-19 ENCOUNTER — Encounter: Payer: Self-pay | Admitting: Internal Medicine

## 2010-06-19 ENCOUNTER — Other Ambulatory Visit: Payer: Self-pay | Admitting: Internal Medicine

## 2010-06-19 ENCOUNTER — Ambulatory Visit
Admission: RE | Admit: 2010-06-19 | Discharge: 2010-06-19 | Payer: Self-pay | Source: Home / Self Care | Attending: Internal Medicine | Admitting: Internal Medicine

## 2010-06-19 DIAGNOSIS — N390 Urinary tract infection, site not specified: Secondary | ICD-10-CM | POA: Insufficient documentation

## 2010-06-19 DIAGNOSIS — N321 Vesicointestinal fistula: Secondary | ICD-10-CM | POA: Insufficient documentation

## 2010-06-19 LAB — URINALYSIS, ROUTINE W REFLEX MICROSCOPIC
Bilirubin Urine: NEGATIVE
Hemoglobin, Urine: NEGATIVE
Ketones, ur: NEGATIVE
Nitrite: NEGATIVE
Specific Gravity, Urine: 1.025 (ref 1.000–1.030)
Urine Glucose: NEGATIVE
Urobilinogen, UA: 0.2 (ref 0.0–1.0)
pH: 5 (ref 5.0–8.0)

## 2010-06-20 NOTE — Progress Notes (Signed)
Summary: nuc pre-procedure  Phone Note Outgoing Call   Call placed to: Patient Reason for Call: Confirm/change Appt Summary of Call: Reviewed information on [employer] Information Sheet (see scanned document for further details).  Spoke with patient.      Nuclear Med Background Indications for Stress Test: Evaluation for Ischemia, Surgical Clearance  Indications Comments: pending colon surgery-Paul Carolynne Edouard       Nuclear Pre-Procedure Cardiac Risk Factors: Hypertension, Lipids, NIDDM Height (in): 68

## 2010-06-20 NOTE — Letter (Signed)
Summary: [facility]   Imported By: Lester [place] 03/15/2010 08:39:28  _____________________________________________________________________  External Attachment:    Type:   Image     Comment:   External Document

## 2010-06-20 NOTE — Assessment & Plan Note (Signed)
Summary: dry mouth/allergies/$50/cd   Vital Signs:  Patient profile:   60 year old male Height:      68 inches Weight:      228.25 pounds BMI:     34.83 O2 Sat:      96 % Temp:     97.4 degrees F oral Pulse rate:   77 / minute BP sitting:   156 / 98  (left arm) Cuff size:   regular  Vitals Entered By: Windell [place] (January 20, 2009 9:08 AM)  CC: dry mouth x 1 month    CC:  dry mouth x 1 month .  History of Present Illness: here with ongoing dry mouth despite stoping the furosemide for 1 wk;  does not check BS at home b/c does not have a glucometer;  has lost some wt; for some reason he thought he was supposed to stop the glimeparide and now with urinary freq , and dry mouth;  BP still elevated today despite good compliance with other meds;  Pt denies CP, sob, doe, wheezing, orthopnea, pnd, worsening LE edema, palps, dizziness or syncope  Pt denies new neuro symptoms such as headache, facial or extremity weakness  Pt denies low sugar symptoms such as shakiness improved with eating.  Overall good compliance with meds, trying to follow low chol, DM diet, wt loss as above, little excercise however   Problems Prior to Update: 1)  Other Specified Forms of Hearing Loss  (ICD-389.8) 2)  Hypokalemia  (ICD-276.8) 3)  Plantar Wart  (ICD-078.12) 4)  Electrocardiogram, Abnormal  (ICD-794.31) 5)  Preventive Health Care  (ICD-V70.0) 6)  Sebaceous Cyst, Infected  (ICD-706.2) 7)  Depression  (ICD-311) 8)  Ibs  (ICD-564.1) 9)  Allergic Rhinitis  (ICD-477.9) 10)  Anxiety  (ICD-300.00) 11)  Hyperlipidemia  (ICD-272.4) 12)  Gout  (ICD-274.9) 13)  Gerd  (ICD-530.81) 14)  Rash-nonvesicular  (ICD-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 Take 1 Tablet By Mouth Once A Day 2)  Cialis 20 Mg Tabs  (Tadalafil) .... Take 1 Tablet By Mouth Once A Day Prn 3)  Prilosec Otc 20 Mg Tbec (Omeprazole Magnesium) .... Take 1 Tablet By Mouth Once A Day 4)  Simvastatin 40 Mg Tabs (Simvastatin) .... Take 1 Tablet By Mouth Once A Day 5)  Twynsta 80-10 Mg Tabs (Telmisartan-Amlodipine) .[name].. 1 Po Once Daily 6)  Furosemide 40 Mg  Tabs (Furosemide) .[name].. 1 By Mouth Once Daily 7)  Triamcinolone Acetonide 0.5 %  Crea (Triamcinolone Acetonide) .... Use Asd  Two Times A Day Prn 8)  Klor-Con 10 10 Meq  Tbcr (Potassium Chloride) .... 2 By Mouth Once Daily 9)  Atenolol 100 Mg Tabs (Atenolol) .[name].. 1 By Mouth Once Daily 10)  Adult Aspirin Ec Low Strength 81 Mg  Tbec (Aspirin) .[name].. 1 By Mouth Once Daily 11)  Mucinex Maximum Strength 1200 Mg  Tb12 (Guaifenesin) .[name].. 1 By Mouth Two Times A Day As Needed 12)  Gfn 550/pse 60 550-60 Mg Xr12h-Tab (Pseudoephedrine-Guaifenesin) .[name].. 1 By Mouth Two Times A Day 13)  Glimepiride 4 Mg Tabs (Glimepiride) .... Take 1 By Mouth Two Times A Day 14)  Metformin Hcl 500 Mg Xr24h-Tab (Metformin Hcl) .... 3 By Mouth Once Daily  Current Medications (verified): 1)  Allopurinol 300 Mg Tabs (Allopurinol) .... Take 1 Tablet By  Mouth Once A Day 2)  Cialis 20 Mg Tabs (Tadalafil) .... Take 1 Tablet By Mouth Once A Day Prn 3)  Prilosec Otc 20 Mg Tbec (Omeprazole Magnesium) .... Take 1 Tablet By Mouth Once A Day 4)  Simvastatin 40 Mg Tabs (Simvastatin) .... Take 1 Tablet By Mouth Once A Day 5)  Twynsta 80-10 Mg Tabs (Telmisartan-Amlodipine) .[name].. 1 Po Once Daily 6)  Furosemide 40 Mg  Tabs (Furosemide) .[name].. 1 By Mouth Once Daily 7)  Triamcinolone Acetonide 0.5 %  Crea (Triamcinolone Acetonide) .... Use Asd  Two Times A Day Prn 8)  Klor-Con 10 10 Meq  Tbcr (Potassium Chloride) .... 2 By Mouth Once Daily 9)  Bystolic 20 Mg Tabs (Nebivolol Hcl) .[name].. 1 By Mouth  Once Daily 10)  Adult Aspirin Ec Low Strength 81 Mg  Tbec (Aspirin) .[name].. 1 By Mouth Once Daily 11)  Mucinex Maximum Strength 1200 Mg  Tb12  (Guaifenesin) .[name].. 1 By Mouth Two Times A Day As Needed 12)  Gfn 550/pse 60 550-60 Mg Xr12h-Tab (Pseudoephedrine-Guaifenesin) .[name].. 1 By Mouth Two Times A Day 13)  Glimepiride 4 Mg Tabs (Glimepiride) .... Take 1 By Mouth Two Times A Day 14)  Metformin Hcl 500 Mg Xr24h-Tab (Metformin Hcl) .... 3 By Mouth Once Daily 15)  Freestyle Lite Test  Strp (Glucose Blood) .... Use Asd 1 Once Daily 16)  Lancets  Misc (Lancets) .... Use Asd 1 Once Daily  Allergies (verified): 1)  ! Accupril 2)  ! * Lexapro 3)  * Glimeparide  Past History:  Past Medical History: Last updated: 06/25/2007 Colonic polyps, hx of Diabetes mellitus, type II Hypertension GERD insomnia Gout E.D. Hyperlipidemia Anxiety Allergic rhinitis IBS Depression recurrent peptic stricture hx of shingles chronic mild elev LFT's  Past Surgical History: Last updated: 06/25/2007 Inguinal herniorrhaphy s/p septoplasty  Social History: Last updated: 03/29/2008 Never Smoked Alcohol use-no Single retiring dec 2009 - from [employer], chartered at [employer] no children  Risk Factors: Smoking Status: never (06/25/2007)  Review of Systems       all otherwise negative - 12 system review done ex cept for some left hearing loss  Physical Exam  General:  alert and overweight-appearing.   Head:  normocephalic and atraumatic.   Eyes:  vision grossly intact, pupils equal, and pupils round.   Ears:  R ear normal and L ear normal.   Nose:  no external deformity and no nasal discharge.   Mouth:  no gingival abnormalities and pharynx pink and moist.   Neck:  supple and no masses.   Lungs:  normal respiratory effort and normal breath sounds.   Heart:  normal rate and regular rhythm.   Extremities:  no edema, no erythema    Impression & Recommendations:  Problem # 1:  DIABETES MELLITUS, TYPE II (ICD-250.00) Assessment Deteriorated  His updated medication list for this problem includes:    Twynsta 80-10 Mg Tabs (Telmisartan-amlodipine)  .[name]... 1 po once daily    Adult Aspirin Ec Low Strength 81 Mg Tbec (Aspirin) .[name]... 1 by mouth once daily    Glimepiride 4 Mg Tabs (Glimepiride) .[name]... Take 1 by mouth two times a day    Metformin Hcl 500 Mg Xr24h-tab (Metformin hcl) .Marland KitchenMarland KitchenMarland KitchenMarland Kitchen 3 by mouth once daily to -restart the glimeparide; start chekcing cbg's, f/u nov 2010 as he is due  Labs Reviewed: Creat: 0.9 (10/11/2008)    Reviewed HgBA1c results: 8.1 (10/11/2008)  8.0 (03/18/2008)  Problem # 2:  HYPERTENSION (ICD-401.9)  His updated medication list for this problem  includes:    Twynsta 80-10 Mg Tabs (Telmisartan-amlodipine) .[name]... 1 po once daily    Furosemide 40 Mg Tabs (Furosemide) .[name]... 1 by mouth once daily    Bystolic 20 Mg Tabs (Nebivolol hcl) .[name]... 1 by mouth  once daily to change the atenolol to the bystolic  as he only wants am meds and needs better control  BP today: 156/98 Prior BP: 148/90 (10/12/2008)  Labs Reviewed: K+: 3.4 (10/11/2008) Creat: : 0.9 (10/11/2008)   Chol: 130 (10/11/2008)   HDL: 26.30 (10/11/2008)   LDL: DEL (03/18/2008)   TG: 263.0 (10/11/2008)  Problem # 3:  HYPERLIPIDEMIA (ICD-272.4)  His updated medication list for this problem includes:    Simvastatin 40 Mg Tabs (Simvastatin) .[name]... Take 1 tablet by mouth once a day  Labs Reviewed: SGOT: 56 (03/18/2008)   SGPT: 61 (03/18/2008)   HDL:26.30 (10/11/2008), 27.4 (03/18/2008)  LDL:DEL (03/18/2008), 69 (96/29/5284)  Chol:130 (10/11/2008), 134 (03/18/2008)  Trig:263.0 (10/11/2008), 208 (03/18/2008) stable overall by hx and exam, ok to continue meds/tx as is   Problem # 4:  OTHER SPECIFIED FORMS OF HEARING LOSS (ICD-389.8) some improved with left ear irrigation  Complete Medication List: 1)  Allopurinol 300 Mg Tabs (Allopurinol) .... Take 1 tablet by mouth once a day 2)  Cialis 20 Mg Tabs (Tadalafil) .... Take 1 tablet by mouth once a day prn 3)  Prilosec Otc 20 Mg Tbec (Omeprazole magnesium) .... Take 1 tablet by mouth once a day 4)  Simvastatin  40 Mg Tabs (Simvastatin) .... Take 1 tablet by mouth once a day 5)  Twynsta 80-10 Mg Tabs (Telmisartan-amlodipine) .[name].. 1 po once daily 6)  Furosemide 40 Mg Tabs (Furosemide) .[name].. 1 by mouth once daily 7)  Triamcinolone Acetonide 0.5 % Crea (Triamcinolone acetonide) .... Use asd  two times a day prn 8)  Klor-con 10 10 Meq Tbcr (Potassium chloride) .... 2 by mouth once daily 9)  Bystolic 20 Mg Tabs (Nebivolol hcl) .[name].. 1 by mouth  once daily 10)  Adult Aspirin Ec Low Strength 81 Mg Tbec (Aspirin) .[name].. 1 by mouth once daily 11)  Mucinex Maximum Strength 1200 Mg Tb12 (Guaifenesin) .[name].. 1 by mouth two times a day as needed 12)  Gfn 550/pse 60 550-60 Mg Xr12h-tab (Pseudoephedrine-guaifenesin) .[name].. 1 by mouth two times a day 13)  Glimepiride 4 Mg Tabs (Glimepiride) .... Take 1 by mouth two times a day 14)  Metformin Hcl 500 Mg Xr24h-tab (Metformin hcl) .... 3 by mouth once daily 15)  Freestyle Lite Test Strp (Glucose blood) .... Use asd 1 once daily 16)  Lancets Misc (Lancets) .... Use asd 1 once daily  Patient Instructions: 1)  your left ear was attempted to be irrigated today 2)  you were shown how to check your sugars today and given the glucometer and supplies 3)  please re-start the glimeparide  4)  please check your blood sugars once daily , usually in the AM, but occasionally before dinner some days would be helpful 5)  stop the atenolol when you are done with the current bottle 6)  start the Bystolic in place of the atenolol at 20 mg per day 7)  Continue all previous medications as before this visit , including the twynsta instead of the azor 8)  Please schedule a follow-up appointment in 6 wks with CPX labs and: 9)  HbgA1C prior to visit, ICD-9: 250.02 10)  Urine Microalbumin prior to visit, ICD-9: Prescriptions: LANCETS  MISC (LANCETS) use asd 1 once daily  #100 x  11   Entered and Authorized by:   Corwin Levins MD   Signed by:   Corwin Levins MD on 01/20/2009   Method used:   Print  then Give to Patient   RxID:   [id number] FREESTYLE LITE TEST  STRP (GLUCOSE BLOOD) use asd 1 once daily  #100 x 11   Entered and Authorized by:   Corwin Levins MD   Signed by:   Corwin Levins MD on 01/20/2009   Method used:   Print then Give to Patient   RxID:   [id number] GLIMEPIRIDE 4 MG TABS (GLIMEPIRIDE) take 1 by mouth two times a day  #180 x 3   Entered and Authorized by:   Corwin Levins MD   Signed by:   Corwin Levins MD on 01/20/2009   Method used:   Print then Give to Patient   RxID:   [id number] BYSTOLIC 20 MG TABS (NEBIVOLOL HCL) 1 by mouth  once daily  #30 x 11   Entered and Authorized by:   Corwin Levins MD   Signed by:   Corwin Levins MD on 01/20/2009   Method used:   Print then Give to Patient   RxID:   [id number] BYSTOLIC 20 MG TABS (NEBIVOLOL HCL) 1 by mouth  once daily  #90 x 3   Entered and Authorized by:   Corwin Levins MD   Signed by:   Corwin Levins MD on 01/20/2009   Method used:   Print then Give to Patient   RxID:   [id number]

## 2010-06-20 NOTE — Letter (Signed)
Summary: Generic Letter  [facility]  [address] [place], [place] [address]   Phone: [phone]  Fax: [phone]    02/14/2010  Nathan Medina [address] [place], [place]  [address]  To Whom it may Concern, Due to medical illness Nathan Medina will be unable to travel on February 16, 2010. If you have any questions concerning Nathan Medina please call the above number.           Sincerely,   Dr. Oliver Barre

## 2010-06-20 NOTE — Assessment & Plan Note (Signed)
Summary: CPX/NWS   Vital Signs:  Patient Profile:   60 Years Old Male Weight:      242.6 pounds O2 Sat:      94 % O2 treatment:    Room Air Temp:     97.6 degrees F oral Pulse rate:   76 / minute BP sitting:   152 / 94  (left arm) Cuff size:   regular  Vitals Entered By: Payton Spark CMA (March 29, 2008 10:28 AM)                 Preventive Care Screening  Last Flu Shot:    Date:  03/29/2008    Results:  decined  Colonoscopy:    Next Due:  02/2008   Chief Complaint:  cpx.  History of Present Illness: here with occas palpitaitons with nervousness but no CP  per se per pt; did not take BP med this am; BP at home usually in the higher 140's/92; has gained wt with poor diet and less activity recetnly; denies polys or low sugars; o/w doing well without complaint     Updated Prior Medication List: ALLOPURINOL 300 MG TABS (ALLOPURINOL) Take 1 tablet by mouth once a day CIALIS 20 MG TABS (TADALAFIL) Take 1 tablet by mouth once a day prn PRILOSEC OTC 20 MG TBEC (OMEPRAZOLE MAGNESIUM) Take 1 tablet by mouth once a day SIMVASTATIN 40 MG TABS (SIMVASTATIN) Take 1 tablet by mouth once a day AZOR 10-40 MG  TABS (AMLODIPINE-OLMESARTAN) 1 by mouth once daily FUROSEMIDE 40 MG  TABS (FUROSEMIDE) 1 by mouth once daily GLIMEPIRIDE 4 MG TABS (GLIMEPIRIDE) 2 by mouth  qam TRIAMCINOLONE ACETONIDE 0.5 %  CREA (TRIAMCINOLONE ACETONIDE) use asd  two times a day prn KLOR-CON 10 10 MEQ  TBCR (POTASSIUM CHLORIDE) 1 by mouth once daily PROZAC 20 MG  CAPS (FLUOXETINE HCL) 1 by mouth once daily ATENOLOL 100 MG TABS (ATENOLOL) 1 by mouth once daily ADULT ASPIRIN EC LOW STRENGTH 81 MG  TBEC (ASPIRIN) 1 by mouth once daily MUCINEX MAXIMUM STRENGTH 1200 MG  TB12 (GUAIFENESIN) 1 by mouth two times a day as needed MUCINEX D [phone] MG XR12H-TAB (PSEUDOEPHEDRINE-GUAIFENESIN) 1 by mouth two times a day as needed  Current Allergies (reviewed today): ! ACCUPRIL ! Judye Bos  Past Medical  History:    Reviewed history from 06/25/2007 and no changes required:       Colonic polyps, hx of       Diabetes mellitus, type II       Hypertension       GERD       insomnia       Gout       E.D.       Hyperlipidemia       Anxiety       Allergic rhinitis       IBS       Depression       recurrent peptic stricture       hx of shingles       chronic mild elev LFT's  Past Surgical History:    Reviewed history from 06/25/2007 and no changes required:       Inguinal herniorrhaphy       s/p septoplasty   Family History:    Reviewed history from 06/25/2007 and no changes required:       grandmother with cva, htn       aunt htn  Social History:    Reviewed history from 06/25/2007 and  no changes required:       Never Smoked       Alcohol use-no       Single       retiring dec 2009 - from [employer] at [employer]       no children    Review of Systems  The patient denies anorexia, fever, weight loss, weight gain, vision loss, decreased hearing, hoarseness, chest pain, syncope, dyspnea on exertion, peripheral edema, prolonged cough, headaches, hemoptysis, abdominal pain, melena, hematochezia, severe indigestion/heartburn, hematuria, incontinence, muscle weakness, suspicious skin lesions, transient blindness, difficulty walking, depression, unusual weight change, abnormal bleeding, enlarged lymph nodes, angioedema, and breast masses.         just received a notice from dr edwards/GI for f/yu colonoscopy; need podiatry for right heel with pain   Physical Exam  General:     alert and overweight-appearing.   Head:     Normocephalic and atraumatic without obvious abnormalities. No apparent alopecia or balding. Eyes:     No corneal or conjunctival inflammation noted. EOMI. Perrla.  Ears:     External ear exam shows no significant lesions or deformities.  Otoscopic examination reveals clear canals, tympanic membranes are intact bilaterally without bulging, retraction, inflammation  or discharge. Hearing is grossly normal bilaterally. Nose:     External nasal examination shows no deformity or inflammation. Nasal mucosa are pink and moist without lesions or exudates. Mouth:     Oral mucosa and oropharynx without lesions or exudates.  Teeth in good repair. Neck:     No deformities, masses, or tenderness noted. Lungs:     Normal respiratory effort, chest expands symmetrically. Lungs are clear to auscultation, no crackles or wheezes. Heart:     Normal rate and regular rhythm. S1 and S2 normal without gallop, murmur, click, rub or other extra sounds. Abdomen:     non-tender, normal bowel sounds, and no distention.   Genitalia:     Testes bilaterally descended without nodularity, tenderness or masses. No scrotal masses or lesions. No penis lesions or urethral discharge. Msk:     no joint tenderness and no joint swelling.   Extremities:     no edema, no ulcers  Neurologic:     cranial nerves II-XII intact and strength normal in all extremities.      Impression & Recommendations:  Problem # 1:  Preventive Health Care (ICD-V70.0) Overall doing well, up to date, counseled on routine health concerns for screening and prevention, immunizations up to date or declined, labs reviewed, ecg reviewed    Orders: EKG w/ Interpretation (93000)   Problem # 2:  ELECTROCARDIOGRAM, ABNORMAL (ICD-794.31) check 2d echo Orders: Echo Referral (Echo)   Problem # 3:  HYPERTENSION (ICD-401.9)  His updated medication list for this problem includes:    Azor 10-40 Mg Tabs (Amlodipine-olmesartan) .[name]... 1 by mouth once daily    Furosemide 40 Mg Tabs (Furosemide) .[name]... 1 by mouth once daily    Atenolol 100 Mg Tabs (Atenolol) .[name]... 1 by mouth once daily atenolol increased today;uncontrolled, follow at home as well   Problem # 4:  DIABETES MELLITUS, TYPE II (ICD-250.00)  His updated medication list for this problem includes:    Glimepiride 4 Mg Tabs (Glimepiride) .[name]... 2 by mouth   qam    Adult Aspirin Ec Low Strength 81 Mg Tbec (Aspirin) .[name]... 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 Take 1 tablet by mouth once a day 2)  Cialis 20 Mg Tabs (Tadalafil) .... Take 1 tablet by mouth once a day prn 3)  Prilosec Otc 20 Mg Tbec (Omeprazole magnesium) .... Take 1 tablet by mouth once a day 4)  Simvastatin 40 Mg Tabs (Simvastatin) .... Take 1 tablet by mouth once a day 5)  Azor 10-40 Mg Tabs (Amlodipine-olmesartan) .[name].. 1 by mouth once daily 6)  Furosemide 40 Mg Tabs (Furosemide) .[name].. 1 by mouth once daily 7)  Glimepiride 4 Mg Tabs (Glimepiride) .... 2 by mouth  qam 8)  Triamcinolone Acetonide 0.5 % Crea (Triamcinolone acetonide) .... Use asd  two times a day prn 9)  Klor-con 10 10 Meq Tbcr (Potassium chloride) .[name].. 1 by mouth once daily 10)  Prozac 20 Mg Caps (Fluoxetine hcl) .[name].. 1 by mouth once daily 11)  Atenolol 100 Mg Tabs (Atenolol) .[name].. 1 by mouth once daily 12)  Adult Aspirin Ec Low Strength 81 Mg Tbec (Aspirin) .[name].. 1 by mouth once daily 13)  Mucinex Maximum Strength 1200 Mg Tb12 (Guaifenesin) .[name].. 1 by mouth two times a day as needed 14)  Mucinex D [phone] Mg Xr12h-tab (Pseudoephedrine-guaifenesin) .[name].. 1 by mouth two times a day as needed  Other Orders: Pneumococcal Vaccine (91478) Admin 1st Vaccine (29562)   Patient Instructions: 1)  You will be contacted about the referral(s) to: echocardiogram 2)  please followup with Dr Randa Evens about the followup colonoscopy 3)  please call if you change your mind on the seasonal and the swine flu shot 4)  you will be called about the swine flu shot when available 5)  you received the pneumonia shot today 6)  your atenolol was increased to 100 mg per day 7)  your glimeparide was  increased to 4 mg pill - 2 in the AM 8)  You will be contacted about the referral(s) to: podiatry 9)  Please schedule a follow-up appointment in 6 months with: 10)  BMP prior to visit, ICD-9: 250.02 11)  Lipid Panel prior to visit, ICD-9: 12)  HbgA1C prior to visit, ICD-9:   Prescriptions: MUCINEX D [phone] MG XR12H-TAB (PSEUDOEPHEDRINE-GUAIFENESIN) 1 by mouth two times a day as needed  #60 x 11   Entered and Authorized by:   Corwin Levins MD   Signed by:   Corwin Levins MD on 03/29/2008   Method used:   Electronically to        [facility] [phone]* (retail)       [address]       [place], [place]  [address]       Ph: [phone]       Fax: [phone]   RxID:   [id number] ATENOLOL 100 MG TABS (ATENOLOL) 1 by mouth once daily  #90 x 3   Entered and Authorized by:   Corwin Levins MD   Signed by:   Corwin Levins MD on 03/29/2008   Method used:   Electronically to        [facility] [phone]* (retail)       [address]       [place], [place]  [address]       Ph: [phone]       Fax: [phone]   RxID:  [id number] PROZAC 20 MG  CAPS (FLUOXETINE HCL) 1 by mouth once daily  #90 x 3   Entered and Authorized by:   Corwin Levins MD   Signed by:   Corwin Levins MD on 03/29/2008   Method used:   Electronically to        [facility] [phone]* (retail)       [address]       [place], [place]  [address]       Ph: [phone]       Fax: [phone]   RxID:   [id number] KLOR-CON 10 10 MEQ  TBCR (POTASSIUM CHLORIDE) 1 by mouth once daily  #90 x 3   Entered and Authorized by:   Corwin Levins MD   Signed by:   Corwin Levins MD on 03/29/2008   Method used:   Electronically to        [facility] [phone]* (retail)       [address]       [place], [place]  [address]       Ph: [phone]       Fax: [phone]   RxID:    [id number] GLIMEPIRIDE 4 MG TABS (GLIMEPIRIDE) 2 by mouth  qam  #180 x 3   Entered and Authorized by:   Corwin Levins MD   Signed by:   Corwin Levins MD on 03/29/2008   Method used:   Electronically to        [facility] [phone]* (retail)       [address]       [place], [place]  [address]       Ph: [phone]       Fax: [phone]   RxID:   [id number] FUROSEMIDE 40 MG  TABS (FUROSEMIDE) 1 by mouth once daily  #90 x 3   Entered and Authorized by:   Corwin Levins MD   Signed by:   Corwin Levins MD on 03/29/2008   Method used:   Electronically to        [facility] [phone]* (retail)       [address]       [place], [place]  [address]       Ph: [phone]       Fax: [phone]   RxID:   [id number] AZOR 10-40 MG  TABS (AMLODIPINE-OLMESARTAN) 1 by mouth once daily  #90 x 3   Entered and Authorized by:   Corwin Levins MD   Signed by:   Corwin Levins MD on 03/29/2008   Method used:   Electronically to        [facility] [phone]* (retail)       [address]       [place], [place]  [address]       Ph: [phone]       Fax: [phone]   RxID:   [id number] SIMVASTATIN 40 MG TABS (SIMVASTATIN) Take 1 tablet by mouth once a day  #90 x 3   Entered and Authorized by:   Corwin Levins MD   Signed by:   Corwin Levins MD on 03/29/2008   Method used:  Electronically to        [facility] [phone]* (retail)       [address]       [place], [place]  [address]       Ph: [phone]       Fax: [phone]   RxID:   [id number] PRILOSEC OTC 20 MG TBEC (OMEPRAZOLE MAGNESIUM) Take 1 tablet by mouth once a day  #42 x 11   Entered and Authorized by:   Corwin Levins MD   Signed by:   Corwin Levins MD on 03/29/2008   Method used:   Electronically to        [facility] [phone]* (retail)       [address]       [place], [place]  [address]       Ph: [phone]       Fax: [phone]   RxID:   [id number] ALLOPURINOL 300 MG TABS (ALLOPURINOL) Take 1 tablet by mouth once a day  #90 x 3   Entered and Authorized by:   Corwin Levins MD   Signed by:   Corwin Levins MD on 03/29/2008   Method used:   Electronically to        [facility] [phone]* (retail)       [address]       [place], [place]  [address]       Ph: [phone]       Fax: [phone]   RxID:   [id number]  ]  Pneumovax Vaccine    Vaccine Type: Pneumovax    Site: right deltoid    Mfr: Merck    Dose: 0.5 ml    Route: IM    Given by: Payton Spark CMA    Exp. Date: 06/10/2009    Lot #: [id number]    VIS given: 12/17/95 version given March 29, 2008.

## 2010-06-20 NOTE — Miscellaneous (Signed)
Summary: Orders Update   Clinical Lists Changes  Orders: Added new Referral order of Echo Referral (Echo) - Signed  Appended Document: Orders Update dahlia - please call to inform pt we went ahead and ordered the echo as we had discussed before, to be done a year after the last one, to f/u the previous results  Appended Document: Orders Update pt informed

## 2010-06-20 NOTE — Letter (Signed)
Summary: [facility]  [facility]   Imported By: Sherian Rein 03/06/2010 14:00:48  _____________________________________________________________________  External Attachment:    Type:   Image     Comment:   External Document

## 2010-06-20 NOTE — Assessment & Plan Note (Signed)
Summary: Cardiology Nuclear Testing  Nuclear Med Background Indications for Stress Test: Evaluation for Ischemia, Surgical Clearance  Indications Comments: Pending colon surgery by Dr. Chevis Pretty   History Comments: No documented CAD  Symptoms: DOE, Fatigue, Palpitations, Rapid HR    Nuclear Pre-Procedure Cardiac Risk Factors: Hypertension, Lipids, NIDDM Caffeine/Decaff Intake: none NPO After: 7:30 AM Lungs: Clear.  O2 Sat 97% on RA. IV 0.9% NS with Angio Cath: 22g     IV Site: R Hand IV Started by: Cathlyn Parsons, RN Chest Size (in) 44     Height (in): 68 Weight (lb): 223 BMI: 34.03 Tech Comments: Bystolic held x 28 hours. Pt did not check BS at home but took Metformin and 1/2 dose of Glimepiride. BS 116 at 12:00.  Nuclear Med Study 1 or 2 day study:  1 day     Stress Test Type:  Eugenie Birks Reading MD:  Cassell Clement, MD     Referring MD:  Oliver Barre, MD Resting Radionuclide:  Technetium 34m Tetrofosmin     Resting Radionuclide Dose:  11.0 mCi  Stress Radionuclide:  Technetium 69m Tetrofosmin     Stress Radionuclide Dose:  33.0 mCi   Stress Protocol   Lexiscan: 0.4 mg   Stress Test Technologist:  Rea [employer], CMA-N     Nuclear Technologist:  Doyne Keel, CNMT  Rest Procedure  Myocardial perfusion imaging was performed at rest 45 minutes following the intravenous administration of Technetium 59m Tetrofosmin.  Stress Procedure  The patient received IV Lexiscan 0.4 mg over 15-seconds.  Technetium 38m Tetrofosmin injected at 30-seconds.  There were no significant changes with infusion, only an isolated PVC.  Quantitative spect images were obtained after a 45 minute delay.  QPS Raw Data Images:  Normal; no motion artifact; normal heart/lung ratio. Stress Images:  Normal homogeneous uptake in all areas of the myocardium. Rest Images:  Normal homogeneous uptake in all areas of the myocardium. Subtraction (SDS):  No evidence of ischemia. Transient Ischemic Dilatation:   1.03  (Normal <1.22)  Lung/Heart Ratio:  0.39  (Normal <0.45)  Quantitative Gated Spect Images QGS EDV:  144 ml QGS ESV:  61 ml QGS EF:  58 % QGS cine images:  Normal LV systolic function.  Findings Normal nuclear study      Overall Impression  Exercise Capacity: Lexiscan with no exercise. BP Response: Normal blood pressure response.  Baseline hypertension. Clinical Symptoms: No chest pain ECG Impression: No significant ST segment change suggestive of ischemia. Overall Impression: Normal stress nuclear study.  Normal apical thinning seen at rest and with stress. Overall Impression Comments: No ischemia.  Normal LV systolic function.  Appended Document: Cardiology Nuclear Testing LMOPT - labs negative, normal, or stable  - No Acute problem

## 2010-06-20 NOTE — Consult Note (Signed)
Summary: [facility]   Imported By: Lanelle Bal 04/21/2008 13:35:25  _____________________________________________________________________  External Attachment:    Type:   Image     Comment:   External Document

## 2010-06-20 NOTE — Letter (Signed)
Summary: [facility] Consult Scheduled Letter  All     ,     Phone:   Fax:       08/01/2007 MRN: [id number]  DELANTE KARAPETYAN [address] [place], [place]  [address]    Dear Mr. Pol,      We have scheduled an appointment for you.  At the recommendation of Dr.James Jonny Ruiz, we have scheduled you a consult with Dr Carolynne Edouard on 08/27/07 at 2:50pm.  Their phone number is [phone].  If this appointment day and time is not convenient for you, please feel free to call the office of the doctor you are being referred to at the number listed above and reschedule the appointment.     [facility] [address] [place] [address]   *Please arrive 20 minutes prior to your appointment*     Thank you,  Patient Care Coordinator [facility]

## 2010-06-20 NOTE — Miscellaneous (Signed)
Summary: Outpatient Coinsurance Notice  Outpatient Coinsurance Notice   Imported By: Marylou Mccoy 05/09/2009 16:36:26  _____________________________________________________________________  External Attachment:    Type:   Image     Comment:   External Document

## 2010-06-20 NOTE — Assessment & Plan Note (Signed)
Summary: 6 MO ROV /NWS $50   Vital Signs:  Patient profile:   61 year old male Height:      68 inches (172.72 cm) Weight:      234.6 pounds (106.64 kg) BMI:     35.80 O2 Sat:      95 % Temp:     98.3 degrees F (36.83 degrees C) oral Pulse rate:   58 / minute BP sitting:   148 / 90  (right arm) Cuff size:   regular  Vitals Entered ByMarland Kitchen Orlan Leavens (Oct 12, 2008 2:04 PM)  Preventive Care Screening  Colonoscopy:    Date:  08/19/2008    Next Due:  08/2009    Results:  Adenomatous Polyp   CC: 6 month follow-up Is Patient Diabetic? Yes  Pain Assessment Patient in pain? no        CC:  6 month follow-up.  History of Present Illness: lost 6 lbsfrom last visit with better diet and wt loss; denies polys or low sugars, trying to follow diet, good compliance with meds; no CP, sob, doe, orthopnea, pnd or LE edema, no wheezing, palp or syncope  Problems Prior to Update: 1)  Hypokalemia  (ICD-276.8) 2)  Plantar Wart  (ICD-078.12) 3)  Electrocardiogram, Abnormal  (ICD-794.31) 4)  Preventive Health Care  (ICD-V70.0) 5)  Sebaceous Cyst, Infected  (ICD-706.2) 6)  Depression  (ICD-311) 7)  Ibs  (ICD-564.1) 8)  Allergic Rhinitis  (ICD-477.9) 9)  Anxiety  (ICD-300.00) 10)  Hyperlipidemia  (ICD-272.4) 11)  Gout  (ICD-274.9) 12)  Gerd  (ICD-530.81) 13)  Rash-nonvesicular  (ICD-782.1) 14)  Hypertension  (ICD-401.9) 15)  Diabetes Mellitus, Type II  (ICD-250.00) 16)  Sinusitis- Acute-nos  (ICD-461.9) 17)  Colonic Polyps, Hx of  (ICD-V12.72)  Medications Prior to Update: 1)  Allopurinol 300 Mg Tabs (Allopurinol) .... Take 1 Tablet By Mouth Once A Day 2)  Cialis 20 Mg Tabs (Tadalafil) .... Take 1 Tablet By Mouth Once A Day Prn 3)  Prilosec Otc 20 Mg Tbec (Omeprazole Magnesium) .... Take 1 Tablet By Mouth Once A Day 4)  Simvastatin 40 Mg Tabs (Simvastatin) .... Take 1 Tablet By Mouth Once A Day 5)  Azor 10-40 Mg  Tabs (Amlodipine-Olmesartan) .[name].. 1 By Mouth Once Daily 6)  Furosemide 40  Mg  Tabs (Furosemide) .[name].. 1 By Mouth Once Daily 7)  Glucotrol 10 Mg Tabs (Glipizide) .[name].. 1 By Mouth Two Times A Day 8)  Triamcinolone Acetonide 0.5 %  Crea (Triamcinolone Acetonide) .... Use Asd  Two Times A Day Prn 9)  Klor-Con 10 10 Meq  Tbcr (Potassium Chloride) .[name].. 1 By Mouth Once Daily 10)  Prozac 20 Mg  Caps (Fluoxetine Hcl) .[name].. 1 By Mouth Once Daily 11)  Atenolol 100 Mg Tabs (Atenolol) .[name].. 1 By Mouth Once Daily 12)  Adult Aspirin Ec Low Strength 81 Mg  Tbec (Aspirin) .[name].. 1 By Mouth Once Daily 13)  Mucinex Maximum Strength 1200 Mg  Tb12 (Guaifenesin) .[name].. 1 By Mouth Two Times A Day As Needed 14)  Mucinex D [phone] Mg Xr12h-Tab (Pseudoephedrine-Guaifenesin) .[name].. 1 By Mouth Two Times A Day As Needed  Current Medications (verified): 1)  Allopurinol 300 Mg Tabs (Allopurinol) .... Take 1 Tablet By Mouth Once A Day 2)  Cialis 20 Mg Tabs (Tadalafil) .... Take 1 Tablet By Mouth Once A Day Prn 3)  Prilosec Otc 20 Mg Tbec (Omeprazole Magnesium) .... Take 1 Tablet By Mouth Once A Day 4)  Simvastatin 40 Mg Tabs (Simvastatin) .... Take  1 Tablet By Mouth Once A Day 5)  Twynsta 80-10 Mg Tabs (Telmisartan-Amlodipine) .[name].. 1 Po Once Daily 6)  Furosemide 40 Mg  Tabs (Furosemide) .[name].. 1 By Mouth Once Daily 7)  Triamcinolone Acetonide 0.5 %  Crea (Triamcinolone Acetonide) .... Use Asd  Two Times A Day Prn 8)  Klor-Con 10 10 Meq  Tbcr (Potassium Chloride) .... 2 By Mouth Once Daily 9)  Atenolol 100 Mg Tabs (Atenolol) .[name].. 1 By Mouth Once Daily 10)  Adult Aspirin Ec Low Strength 81 Mg  Tbec (Aspirin) .[name].. 1 By Mouth Once Daily 11)  Mucinex Maximum Strength 1200 Mg  Tb12 (Guaifenesin) .[name].. 1 By Mouth Two Times A Day As Needed 12)  Gfn 550/pse 60 550-60 Mg Xr12h-Tab (Pseudoephedrine-Guaifenesin) .[name].. 1 By Mouth Two Times A Day 13)  Glimepiride 4 Mg Tabs (Glimepiride) .... Take 1 By Mouth Two Times A Day 14)  Metformin Hcl 500 Mg Xr24h-Tab (Metformin Hcl) .... 3 By Mouth Once Daily  Allergies  (verified): 1)  ! Accupril 2)  ! * Lexapro 3)  * Glimeparide  Comments:  Nurse/Medical Assistant: The patient's medications and allergies were reviewed with the patient and were updated in the Medication and Allergy Lists. Valentina Gu Brand (Oct 12, 2008 2:07 PM)  Past History:  Past Medical History:    Colonic polyps, hx of    Diabetes mellitus, type II    Hypertension    GERD    insomnia    Gout    E.D.    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, chartered at [employer]    no children (03/29/2008)  Risk Factors:    Alcohol Use: N/A    >5 drinks/d w/in last 3 months: N/A    Caffeine Use: N/A    Diet: N/A    Exercise: N/A  Risk Factors:    Smoking Status: never (06/25/2007)    Packs/Day: N/A    Cigars/wk: N/A    Pipe Use/wk: N/A    Cans of tobacco/wk: N/A    Passive Smoke Exposure: N/A  Social History:    Reviewed history from 03/29/2008 and no changes required:       Never Smoked       Alcohol use-no       Single       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   Head:  normocephalic and atraumatic.   Eyes:  vision grossly intact, pupils equal, and pupils round.   Ears:  R ear normal and L ear normal.   Nose:  no external deformity and no nasal discharge.   Mouth:  no gingival abnormalities and pharynx pink and moist.   Neck:  supple and no masses.   Lungs:  normal respiratory effort and normal breath sounds.   Heart:  normal rate, regular rhythm, and no gallop.   Abdomen:  soft, non-tender,  and normal bowel sounds.   Extremities:   no edema, no ulcers    Impression & Recommendations:  Problem # 1:  DIABETES MELLITUS, TYPE II (ICD-250.00)  The following medications were removed from the medication list:    Glucotrol 10 Mg Tabs (Glipizide) .[name]... 1 by mouth two times a day His updated medication list for this problem includes:    Twynsta 80-10 Mg Tabs (Telmisartan-amlodipine) .[name]... 1 po once daily    Adult Aspirin Ec Low Strength 81 Mg Tbec (Aspirin) .[name]... 1 by mouth once daily    Glimepiride 4 Mg Tabs (Glimepiride) .[name]... Take 1 by mouth two times a day    Metformin Hcl 500 Mg Xr24h-tab (Metformin hcl) .Marland KitchenMarland KitchenMarland KitchenMarland Kitchen 3 by mouth once daily  Labs Reviewed: Creat: 0.9 (10/11/2008)    Reviewed HgBA1c results: 8.1 (10/11/2008)  8.0 (03/18/2008) add metformin as above for uncontrolled sugar  Problem # 2:  HYPERLIPIDEMIA (ICD-272.4)  His updated medication list for this problem includes:    Simvastatin 40 Mg Tabs (Simvastatin) .[name]... Take 1 tablet by mouth once a day  Labs Reviewed: SGOT: 56 (03/18/2008)   SGPT: 61 (03/18/2008)   HDL:26.30 (10/11/2008), 27.4 (03/18/2008)  LDL:DEL (03/18/2008), 69 (04/54/0981)  Chol:130 (10/11/2008), 134 (03/18/2008)  Trig:263.0 (10/11/2008), 208 (03/18/2008) stable overall by hx and exam, ok to continue meds/tx as is   Problem # 3:  HYPERTENSION (ICD-401.9)  His updated medication list for this problem includes:    Twynsta 80-10 Mg Tabs (Telmisartan-amlodipine) .[name]... 1 po once daily    Furosemide 40 Mg Tabs (Furosemide) .[name]... 1 by mouth once daily    Atenolol 100 Mg Tabs (Atenolol) .[name]... 1 by mouth once daily  BP today: 148/90 Prior BP: 152/94 (03/29/2008)  Labs Reviewed: K+: 3.4 (10/11/2008) Creat: : 0.9 (10/11/2008)   Chol: 130 (10/11/2008)   HDL: 26.30 (10/11/2008)   LDL: DEL (03/18/2008)   TG: 263.0 (10/11/2008) change atenolol to labetolol as above, change azor to twynsta due to cost, o/w stable, follow BP at home and next visit  Problem # 4:  HYPOKALEMIA (ICD-276.8) increase  to 2 once daily   Complete Medication List: 1)  Allopurinol 300 Mg Tabs (Allopurinol) .... Take 1 tablet by mouth once a day 2)  Cialis 20 Mg Tabs (Tadalafil) .... Take 1 tablet by mouth once a day prn 3)  Prilosec Otc 20 Mg Tbec (Omeprazole magnesium) .... Take 1 tablet by mouth once a day 4)  Simvastatin 40 Mg Tabs (Simvastatin) .... Take 1 tablet by mouth once a day 5)  Twynsta 80-10 Mg Tabs (Telmisartan-amlodipine) .[name].. 1 po once daily 6)  Furosemide 40 Mg Tabs (Furosemide) .[name].. 1 by mouth once daily 7)  Triamcinolone Acetonide 0.5 % Crea (Triamcinolone acetonide) .... Use asd  two times a day prn 8)  Klor-con 10 10 Meq Tbcr (Potassium chloride) .... 2 by mouth once daily 9)  Atenolol 100 Mg Tabs (Atenolol) .[name].. 1 by mouth once daily 10)  Adult Aspirin Ec Low Strength 81 Mg Tbec (Aspirin) .[name].. 1 by mouth once daily 11)  Mucinex Maximum Strength 1200 Mg Tb12 (Guaifenesin) .[name].. 1 by mouth two times a day as needed 12)  Gfn 550/pse 60 550-60 Mg Xr12h-tab (Pseudoephedrine-guaifenesin) .[name].. 1 by mouth two times a day 13)  Glimepiride 4 Mg Tabs (Glimepiride) .... Take 1 by mouth two times a day 14)  Metformin Hcl 500 Mg Xr24h-tab (Metformin hcl) .... 3 by mouth once daily  Patient Instructions: 1)  start the metformin at on e pill per day  for 1 wk, then 2 pills per day for 1 wk, then 3 pills per day after that 2)  Continue all medications that you may have been taking previously 3)  stop the azor after the current bottle is done 4)  start the twynsta 10/80 mg - 1 per day - samples to start  5)  increase the potassium to 2 per day 6)  Please schedule a follow-up appointment in 6 months wit CPX  labs and: 7)  HbgA1C prior to visit, ICD-9: 250.02 8)  Urine Microalbumin prior to visit, ICD-9: Prescriptions: KLOR-CON 10 10 MEQ  TBCR (POTASSIUM CHLORIDE) 2 by mouth once daily  #60 x 11   Entered and Authorized by:   Corwin Levins MD   Signed by:   Corwin Levins MD on 10/12/2008   Method used:    Print then Give to Patient   RxID:   [id number] METFORMIN HCL 500 MG XR24H-TAB (METFORMIN HCL) 3 by mouth once daily  #90 x 11   Entered and Authorized by:   Corwin Levins MD   Signed by:   Corwin Levins MD on 10/12/2008   Method used:   Print then Give to Patient   RxID:   [id number] GFN 550/PSE 60 550-60 MG XR12H-TAB (PSEUDOEPHEDRINE-GUAIFENESIN) 1 by mouth two times a day  #60 x 2   Entered and Authorized by:   Corwin Levins MD   Signed by:   Corwin Levins MD on 10/12/2008   Method used:   Print then Give to Patient   RxID:   [id number] TWYNSTA 80-10 MG TABS (TELMISARTAN-AMLODIPINE) 1 po once daily  #30 x 11   Entered and Authorized by:   Corwin Levins MD   Signed by:   Corwin Levins MD on 10/12/2008   Method used:   Print then Give to Patient   RxID:   [id number] TWYNSTA 80-10 MG TABS (TELMISARTAN-AMLODIPINE) 1 po once daily  #90 x 3   Entered and Authorized by:   Corwin Levins MD   Signed by:   Corwin Levins MD on 10/12/2008   Method used:   Print then Give to Patient   RxID:   [id number] METFORMIN HCL 500 MG XR24H-TAB (METFORMIN HCL) 3 by mouth once daily  #90 x 3   Entered and Authorized by:   Corwin Levins MD   Signed by:   Corwin Levins MD on 10/12/2008   Method used:   Electronically to        [facility]. [phone]* (retail)       [address]       [place], [place]  [address]       Ph: [phone]       Fax: [phone]   RxID:   [id number]

## 2010-06-20 NOTE — Progress Notes (Signed)
Summary: PA Benicar  Phone Note From Pharmacy   Caller: [facility] [address] [phone]* Summary of Call: PA is needed for Benicar 40mg  tab 1 by mouth once daily. Are there any covered alternatives? [phone] pt ID# [id number] to initiate PA. Initial call taken by: Margaret Pyle, CMA,  November 17, 2009 1:16 PM  Follow-up for Phone Call        Alternatives are usually Losartan, Diovan, and Micardis. Please advise. Follow-up by: Lucious Groves,  November 18, 2009 9:16 AM  Additional Follow-up for Phone Call Additional follow up Details #1::        please call pt - I believe he has a drug card that makes the PA a moot point; in that no matter what the coverage is, the max copay at the drug store at point of service I believe is about $25, and he even has the option of max $25 for 3 mo if he fills out a certain from from the company to get this arrange at a mail-in pharmacy Additional Follow-up by: Corwin Levins MD,  November 18, 2009 9:41 AM    Additional Follow-up for Phone Call Additional follow up Details #2::    Attempted to discuss with patient and was unable to leave message, machine asked for security code. Lucious Groves  November 18, 2009 10:29 AM   left message on machine to call back to office. Lucious Groves  November 23, 2009 4:41 PM   Spoke with patient and notified, He did not provide the card to the pharmacy but will now do so. Lucious Groves  November 25, 2009 10:36 AM

## 2010-06-20 NOTE — Assessment & Plan Note (Signed)
Summary: fu-stc   Vital Signs:  Patient Profile:   60 Years Old Male Weight:      237 pounds Temp:     97.1 degrees F oral Pulse rate:   75 / minute BP sitting:   140 / 87  (right arm) Cuff size:   regular  Pt. in pain?   no  Vitals Entered By: Maris Berger (June 25, 2007 2:41 PM)                  Preventive Care Screening  Last Tetanus Booster:    Date:  05/21/2002    Next Due:  05/2012    Results:  given   Colonoscopy:    Date:  02/24/2007    Next Due:  02/2010    Results:  Adenomatous Polyp    Chief Complaint:  SINUS INFECTION and skin issues.  History of Present Illness: here with acute osnet sinus symptoms sinus pain, pressiure, fever abd greenish  d/c; also with bilat rash to the legs at the ankles that itches; denies polys or low sugars  Current Allergies (reviewed today): ! ACCUPRIL ! * LEXAPRO Updated/Current Medications (including changes made in today's visit):  ALLOPURINOL 300 MG TABS (ALLOPURINOL) Take 1 tablet by mouth once a day CIALIS 20 MG TABS (TADALAFIL) Take 1 tablet by mouth once a day HYDROCODONE-ACETAMINOPHEN 5-500 MG TABS (HYDROCODONE-ACETAMINOPHEN) Take 1 tablet by mouth four times a day PERMETHRIN 5 % CREA (PERMETHRIN)  PRILOSEC OTC 20 MG TBEC (OMEPRAZOLE MAGNESIUM) Take 1 tablet by mouth once a day SIMVASTATIN 40 MG TABS (SIMVASTATIN) Take 1 tablet by mouth once a day BENICAR 40 MG  TABS (OLMESARTAN MEDOXOMIL) Take 1 tablet by mouth once a day FUROSEMIDE 40 MG  TABS (FUROSEMIDE) 1 by mouth bid GLIMEPIRIDE 1 MG  TABS (GLIMEPIRIDE) 1 by mouth qd AMLODIPINE BESYLATE 10 MG  TABS (AMLODIPINE BESYLATE) 1 by mouth qd AZITHROMYCIN 250 MG  TABS (AZITHROMYCIN) 2po qd for 1 day, then 1po qd for 4days, then stop TRIAMCINOLONE ACETONIDE 0.5 %  CREA (TRIAMCINOLONE ACETONIDE) use asd  two times a day prn   Past Medical History:    Reviewed history and no changes required:       Colonic polyps, hx of       Diabetes mellitus, type  II       Hypertension       GERD       insomnia       Gout       E.D.       Hyperlipidemia       Anxiety       Allergic rhinitis       IBS       Depression       recurrent peptic stricture       hx of shingles       chronic mild elev LFT's  Past Surgical History:    Reviewed history and no changes required:       Inguinal herniorrhaphy       s/p septoplasty   Family History:    Reviewed history and no changes required:       grandmother with cva, htn       aunt htn  Social History:    Reviewed history and no changes required:       Never Smoked       Alcohol use-no   Risk Factors:  Tobacco use:  never Alcohol use:  no  Colonoscopy History:  Date of Last Colonoscopy:  02/24/2007    Results:  Adenomatous Polyp     Physical Exam  General:     mid ill Head:     Normocephalic and atraumatic without obvious abnormalities. No apparent alopecia or balding. Eyes:     No corneal or conjunctival inflammation noted. EOMI. Perrla. Funduscopic exam benign, without hemorrhages, exudates or papilledema. Vision grossly normal. Ears:     bilat tm's red, sinus tender bilat Nose:     nasal dischargemucosal pallor.   Mouth:     pharyngeal erythema.   Neck:     cervical lymphadenopathy.   Lungs:     Normal respiratory effort, chest expands symmetrically. Lungs are clear to auscultation, no crackles or wheezes. Heart:     Normal rate and regular rhythm. S1 and S2 normal without gallop, murmur, click, rub or other extra sounds. Extremities:     no edema Skin:     bilat erythem non-scaly rash under the socks bilat medial distal legs    Impression & Recommendations:  Problem # 1:  SINUSITIS- ACUTE-NOS (ICD-461.9)  His updated medication list for this problem includes:    Azithromycin 250 Mg Tabs (Azithromycin) .[name]... 2po qd for 1 day, then 1po qd for 4days, then stop tx as above  Problem # 2:  DIABETES MELLITUS, TYPE II (ICD-250.00)  His updated medication list  for this problem includes:    Benicar 40 Mg Tabs (Olmesartan medoxomil) .[name]... Take 1 tablet by mouth once a day    Glimepiride 1 Mg Tabs (Glimepiride) .[name]... 1 by mouth qd check labs today, o/w stable  Orders: TLB-A1C / Hgb A1C (Glycohemoglobin) (83036-A1C) TLB-BMP (Basic Metabolic Panel-BMET) (80048-METABOL) TLB-Lipid Panel (80061-LIPID)   Problem # 3:  HYPERTENSION (ICD-401.9)  The following medications were removed from the medication list:    Diltiazem Hcl Coated Beads 300 Mg Cp24 (Diltiazem hcl coated beads) .[name]... Take 1 capsule by mouth once a day  His updated medication list for this problem includes:    Benicar 40 Mg Tabs (Olmesartan medoxomil) .[name]... Take 1 tablet by mouth once a day    Furosemide 40 Mg Tabs (Furosemide) .[name]... 1 by mouth bid    Amlodipine Besylate 10 Mg Tabs (Amlodipine besylate) .[name]... 1 by mouth qd  BP today: 140/87, stable, cont meds as is, prob mild elev with situational  Labs Reviewed: Creat: 1.1 (12/20/2006) Chol: 129 (12/20/2006)   HDL: 32.3 (12/20/2006)   LDL: 68 (12/20/2006)   TG: 143 (12/20/2006)   Problem # 4:  RASH-NONVESICULAR (ICD-782.1)  His updated medication list for this problem includes:    Triamcinolone Acetonide 0.5 % Crea (Triamcinolone acetonide) ..... Use asd  two times a day prn tx as above  Complete Medication List: 1)  Allopurinol 300 Mg Tabs (Allopurinol) .... Take 1 tablet by mouth once a day 2)  Cialis 20 Mg Tabs (Tadalafil) .... Take 1 tablet by mouth once a day 3)  Hydrocodone-acetaminophen 5-500 Mg Tabs (Hydrocodone-acetaminophen) .... Take 1 tablet by mouth four times a day 4)  Permethrin 5 % Crea (Permethrin) 5)  Prilosec Otc 20 Mg Tbec (Omeprazole magnesium) .... Take 1 tablet by mouth once a day 6)  Simvastatin 40 Mg Tabs (Simvastatin) .... Take 1 tablet by mouth once a day 7)  Benicar 40 Mg Tabs (Olmesartan medoxomil) .... Take 1 tablet by mouth once a day 8)  Furosemide 40 Mg Tabs (Furosemide) .[name].. 1 by mouth  bid 9)  Glimepiride 1 Mg Tabs (Glimepiride) .[name].. 1 by mouth qd  10)  Amlodipine Besylate 10 Mg Tabs (Amlodipine besylate) .[name].. 1 by mouth qd 11)  Azithromycin 250 Mg Tabs (Azithromycin) .... 2po qd for 1 day, then 1po qd for 4days, then stop 12)  Triamcinolone Acetonide 0.5 % Crea (Triamcinolone acetonide) .... Use asd  two times a day prn   Patient Instructions: 1)  use the lotrimin OTC for the bellybutton rash 2)  use the prescriptioin steroid cream for the leg rashes 3)  take the antibiotic as prescribed 4)  you will have blood work today 5)  Please schedule a follow-up appointment in 7months with cpx labs    Prescriptions: TRIAMCINOLONE ACETONIDE 0.5 %  CREA (TRIAMCINOLONE ACETONIDE) use asd  two times a day prn  #1 x 2   Entered and Authorized by:   Corwin Levins MD   Signed by:   Corwin Levins MD on 06/25/2007   Method used:   Electronically sent to ...       [facility] [address] [phone]*       [address]       [place], [place]  [address]       Ph: [phone]       Fax: [phone]   RxID:   [id number] AZITHROMYCIN 250 MG  TABS (AZITHROMYCIN) 2po qd for 1 day, then 1po qd for 4days, then stop  #6 x 1   Entered and Authorized by:   Corwin Levins MD   Signed by:   Corwin Levins MD on 06/25/2007   Method used:   Electronically sent to ...       [facility] [address] [phone]*       [address]       [place], [place]  [address]       Ph: [phone]       Fax: [phone]   RxID:   [id number]  ]

## 2010-06-20 NOTE — Procedures (Signed)
Summary: Colonoscopy/Dr. Carman Ching  Colonoscopy/Dr. Carman Ching   Imported By: Maryln Gottron 03/26/2007 14:56:49  _____________________________________________________________________  External Attachment:    Type:   Image     Comment:   External Document

## 2010-06-20 NOTE — Assessment & Plan Note (Signed)
Summary: 5 mos f/u #/cd   Vital Signs:  Patient profile:   60 year old male Height:      68 inches Weight:      224 pounds BMI:     34.18 O2 Sat:      97 % on Room air Temp:     97.9 degrees F oral Pulse rate:   65 / minute BP sitting:   100 / 68  (left arm) Cuff size:   large  Vitals Entered By: Zella Ball Ewing CMA Duncan Dull) (April 07, 2010 8:59 AM)  O2 Flow:  Room air  CC: 5 month followup/RE   CC:  5 month followup/RE.  History of Present Illness: here for wellness;  unfortuately had recurrence or diverticulitis, has had total 2 CT scan - the second scan with contained performation, now better on cipro/flagyl. has seen Dr Toth/surgury with plan for barium enema and probable elective partial colectomy soon; in the meantime on extended course antibx for several wks.  Has had some wt loss and lower po intake - Having several low sugars at noon time on current med with decreased appetitie.  Has had a few unusual leg cramps as well.  Pt denies CP, worsening sob, doe, wheezing, orthopnea, pnd, worsening LE edema, palps, dizziness or syncope  Pt denies new neuro symptoms such as headache, facial or extremity weakness  Pt denies polydipsia, polyuria.  Overall good compliance with meds, trying to follow low chol, DM diet, wt stable, little excercise however .  No fever, wt loss, night sweats, loss of appetite or other constitutional symptoms  Denies worsening depressive symptoms, suicidal ideation, or panic.  Overall good compliance with meds, and good tolerability.  Pt states good ability with ADL's, low fall risk, home safety reviewed and adequate, no significant change in hearing or vision.  Preventive Screening-Counseling & Management      Drug Use:  no.    Problems Prior to Update: 1)  Joint Effusion, Right Knee  (ICD-719.06) 2)  Preoperative Examination  (ICD-V72.84) 3)  Diverticulitis, Acute  (ICD-562.11) 4)  Diverticulitis, Acute  (ICD-562.11) 5)  Diarrhea of Presumed Infectious  Origin  (ICD-009.3) 6)  Abdominal Pain, Unspecified Site  (ICD-789.00) 7)  Peripheral Edema  (ICD-782.3) 8)  Unspecified Disorder of Kidney and Ureter  (ICD-593.9) 9)  Plantar Fasciitis, Right  (ICD-728.71) 10)  Rash-nonvesicular  (ICD-782.1) 11)  Transaminases, Serum, Elevated  (ICD-790.4) 12)  Other Specified Forms of Hearing Loss  (ICD-389.8) 13)  Hypokalemia  (ICD-276.8) 14)  Plantar Wart  (ICD-078.12) 15)  Electrocardiogram, Abnormal  (ICD-794.31) 16)  Preventive Health Care  (ICD-V70.0) 17)  Sebaceous Cyst, Infected  (ICD-706.2) 18)  Depression  (ICD-311) 19)  Ibs  (ICD-564.1) 20)  Allergic Rhinitis  (ICD-477.9) 21)  Anxiety  (ICD-300.00) 22)  Hyperlipidemia  (ICD-272.4) 23)  Gout  (ICD-274.9) 24)  Gerd  (ICD-530.81) 25)  Rash-nonvesicular  (ICD-782.1) 26)  Hypertension  (ICD-401.9) 27)  Diabetes Mellitus, Type II  (ICD-250.00) 28)  Sinusitis- Acute-nos  (ICD-461.9) 29)  Colonic Polyps, Hx of  (ICD-V12.72)  Medications Prior to Update: 1)  Allopurinol 300 Mg Tabs (Allopurinol) .... Take 1 Tablet By Mouth Once A Day 2)  Cialis 20 Mg Tabs (Tadalafil) .... Take 1 Tablet By Mouth Once A Day Prn 3)  Prilosec Otc 20 Mg Tbec (Omeprazole Magnesium) .... Take 1 Tablet By Mouth Once A Day 4)  Simvastatin 40 Mg Tabs (Simvastatin) .... Take 1/2 Tablet By Mouth Once A Day 5)  Micardis 80 Mg Tabs (Telmisartan) .Marland KitchenMarland KitchenMarland Kitchen  1po Once Daily 6)  Furosemide 40 Mg  Tabs (Furosemide) .[name].. 1 By Mouth Two Times A Day 7)  Klor-Con 10 10 Meq  Tbcr (Potassium Chloride) .... 3 By Mouth Once Daily 8)  Bystolic 20 Mg Tabs (Nebivolol Hcl) .[name].. 1 By Mouth  Once Daily 9)  Adult Aspirin Ec Low Strength 81 Mg  Tbec (Aspirin) .[name].. 1 By Mouth Once Daily 10)  Mucinex Maximum Strength 1200 Mg  Tb12 (Guaifenesin) .[name].. 1 By Mouth Two Times A Day As Needed 11)  Gfn 550/pse 60 550-60 Mg Xr12h-Tab (Pseudoephedrine-Guaifenesin) .[name].. 1 By Mouth Two Times A Day 12)  Glimepiride 4 Mg Tabs (Glimepiride) .... Take 2 By Mouth  Qam 13)  Metformin Hcl 500 Mg Xr24h-Tab (Metformin Hcl) .... 3 By Mouth Once Daily 14)  Lancets  Misc (Lancets) .... Use Asd 1 Once Daily 15)  Actos 30 Mg Tabs (Pioglitazone Hcl) .[name].. 1 By Mouth Once Daily 16)  Diltiazem Hcl Cr 240 Mg Xr24h-Cap (Diltiazem Hcl) .[name].. 1po Once Daily 17)  Hydralazine Hcl 50 Mg Tabs (Hydralazine Hcl) .[name].. 1 By Mouth Three Times A Day 18)  Alprazolam 0.5 Mg Tabs (Alprazolam) .... 1/2 - 1 By Mouth Once Daily As Needed 19)  Metronidazole 250 Mg Tabs (Metronidazole) .[name].. 1po Four Times Per Day 20)  Tramadol Hcl 50 Mg Tabs (Tramadol Hcl) .[name].. 1 - 2 By Mouth Q 6 Hrs As Needed Pain  Current Medications (verified): 1)  Allopurinol 300 Mg Tabs (Allopurinol) .... Take 1 Tablet By Mouth Once A Day 2)  Cialis 20 Mg Tabs (Tadalafil) .... Take 1 Tablet By Mouth Once A Day Prn 3)  Prilosec Otc 20 Mg Tbec (Omeprazole Magnesium) .... Take 1 Tablet By Mouth Once A Day 4)  Simvastatin 40 Mg Tabs (Simvastatin) .... Take 1/2 Tablet By Mouth Once A Day 5)  Micardis 80 Mg Tabs (Telmisartan) .[name].. 1po Once Daily 6)  Furosemide 40 Mg  Tabs (Furosemide) .[name].. 1 By Mouth Once Daily 7)  Klor-Con 10 10 Meq  Tbcr (Potassium Chloride) .... 3 By Mouth Once Daily 8)  Bystolic 20 Mg Tabs (Nebivolol Hcl) .[name].. 1 By Mouth  Once Daily 9)  Adult Aspirin Ec Low Strength 81 Mg  Tbec (Aspirin) .[name].. 1 By Mouth Once Daily 10)  Mucinex Maximum Strength 1200 Mg  Tb12 (Guaifenesin) .[name].. 1 By Mouth Two Times A Day As Needed 11)  Gfn 550/pse 60 550-60 Mg Xr12h-Tab (Pseudoephedrine-Guaifenesin) .[name].. 1 By Mouth Two Times A Day 12)  Glimepiride 4 Mg Tabs (Glimepiride) .... Take 2 By Mouth Qam 13)  Metformin Hcl 500 Mg Xr24h-Tab (Metformin Hcl) .... 3 By Mouth Once Daily 14)  Lancets  Misc (Lancets) .... Use Asd 1 Once Daily 15)  Diltiazem Hcl Cr 240 Mg Xr24h-Cap (Diltiazem Hcl) .[name].. 1po Once Daily 16)  Hydralazine Hcl 50 Mg Tabs (Hydralazine Hcl) .[name].. 1 By Mouth Three Times A Day 17)  Alprazolam 0.5 Mg Tabs  (Alprazolam) .... 1/2 - 1 By Mouth Once Daily As Needed 18)  Metronidazole 250 Mg Tabs (Metronidazole) .[name].. 1po Four Times Per Day 19)  Tramadol Hcl 50 Mg Tabs (Tramadol Hcl) .[name].. 1 - 2 By Mouth Q 6 Hrs As Needed Pain 20)  Ciprofloxacin Hcl 500 Mg Tabs (Ciprofloxacin Hcl) .[name].. 1 By Mouth Two Times A Day  Allergies (verified): 1)  ! Accupril 2)  ! * Lexapro 3)  * Glimeparide  Past History:  Past Medical History: Last updated: 06/25/2007 Colonic polyps, hx of Diabetes mellitus, type II Hypertension GERD  insomnia Gout E.D. 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         all otherwise negative per pt -    Physical Exam  General:  alert and overweight-appearing Head:  normocephalic and atraumatic.   Eyes:  vision grossly intact, pupils equal, and pupils round.   Ears:  R ear normal and L ear normal.   Nose:  no external deformity and no nasal discharge.   Mouth:  no gingival  abnormalities and pharynx pink and moist.   Neck:  supple and no masses.   Lungs:  normal respiratory effort and normal breath sounds.   Heart:  normal rate and regular rhythm.   Abdomen:  soft, non-tender, and normal bowel sounds.   Msk:  no joint tenderness and no joint swelling.   Extremities:  no edema, no erythema  Neurologic:  no confused, motor gross normal Skin:  no rashes.  color normal.   Psych:  not depressed appearing and slightly anxious.     Impression & Recommendations:  Problem # 1:  Preventive Health Care (ICD-V70.0) Overall doing well, age appropriate education and counseling updated, referral for preventive services and immunizations addressed, dietary counseling and smoking status adressed , most recent labs reviewed, ecg reviewed I have personally reviewed and have noted 1.The patient's medical and social history 2.Their use of alcohol, tobacco or illicit drugs 3.Their current medications and supplements 4. Functional ability including ADL's, fall risk, home safety risk, hearing & visual impairment  5.Diet and physical activities 6.Evidence for depression or mood disorders The patients weight, height, BMI  have been recorded in the chart I have made referrals, counseling and provided education to the patient based review of the above   Problem # 2:  DIABETES MELLITUS, TYPE II (ICD-250.00)  The following medications were removed from the medication list:    Actos 30 Mg Tabs (Pioglitazone hcl) .[name]... 1 by mouth once daily His updated medication list for this problem includes:    Micardis 80 Mg Tabs (Telmisartan) .[name]... 1po once daily    Adult Aspirin Ec Low Strength 81 Mg Tbec (Aspirin) .[name]... 1 by mouth once daily    Glimepiride 4 Mg Tabs (Glimepiride) .[name]... Take 2 by mouth qam    Metformin Hcl 500 Mg Xr24h-tab (Metformin hcl) .Marland KitchenMarland KitchenMarland KitchenMarland Kitchen 3 by mouth once daily  Labs Reviewed: Creat: 1.1 (04/03/2010)    Reviewed HgBA1c results: 6.0 (04/03/2010)  6.0  (09/29/2009) overcontrolled in current situation with recent wt loss related to recurring diverticulitis ; labs reviewed, to stop the actos  Problem # 3:  HYPERLIPIDEMIA (ICD-272.4)  His updated medication list for this problem includes:    Simvastatin 40 Mg Tabs (Simvastatin) .[name]... Take 1/2 tablet  by mouth once a day ok to hold statin with LDL 32 in the face of current ilness and prob impedning surgury  Labs Reviewed: SGOT: 32 (04/03/2010)   SGPT: 31 (04/03/2010)   HDL:29.50 (04/03/2010), 30.60 (03/28/2009)  LDL:32 (04/03/2010), DEL (11/91/4782)  Chol:100 (04/03/2010), 129 (03/28/2009)  Trig:192.0 (04/03/2010), 254.0 (03/28/2009)  Problem # 4:  HYPERTENSION (ICD-401.9)  His updated medication list for this problem includes:    Micardis 80 Mg Tabs (Telmisartan) .[name]... 1po once daily    Furosemide 40 Mg Tabs (Furosemide) .[name]... 1 by mouth once daily    Bystolic 20 Mg Tabs (Nebivolol hcl) .[name]... 1 by mouth  once daily    Diltiazem Hcl Cr 240 Mg Xr24h-cap (Diltiazem hcl) .[name]... 1po once daily    Hydralazine Hcl 50 Mg Tabs (Hydralazine hcl) .[name]... 1 by mouth three times a day  BP today: 100/68 Prior BP: 138/78 (02/17/2010)  Labs Reviewed: K+: 3.9 (04/03/2010) Creat: : 1.1 (04/03/2010)   Chol: 100 (04/03/2010)   HDL: 29.50 (04/03/2010)   LDL: 32 (04/03/2010)   TG: 192.0 (04/03/2010) stable overall by hx and exam, ok to continue meds/tx as is ; pt without dizzy , but should check BP daily - if persistently < 120/90 , you could reduce the bystolic to HALF 20 mg  Problem # 5:  PREOPERATIVE EXAMINATION (ICD-V72.84) c/w pt = due to mult risk factor and age his is at mod increased risk , though elective surgury ;  ecg reviewed, will need stress test prior to surgury Orders: EKG w/ Interpretation (93000) Cardiolite (Cardiolite)  Problem # 6:  JOINT EFFUSION, RIGHT KNEE (ICD-719.06) with pain, mild to mod, should be able to do the stress test above;  for tramadol as needed, and consider eventual  ortho eval , but should have the other above first  Complete Medication List: 1)  Allopurinol 300 Mg Tabs (Allopurinol) .... Take 1 tablet by mouth once a day 2)  Cialis 20 Mg Tabs (Tadalafil) .... Take 1 tablet by mouth once a day prn 3)  Prilosec Otc 20 Mg Tbec (Omeprazole magnesium) .... Take 1 tablet by mouth once a day 4)  Simvastatin 40 Mg Tabs (Simvastatin) .... Take 1/2 tablet by mouth once a day 5)  Micardis 80 Mg Tabs (Telmisartan) .[name].. 1po once daily 6)  Furosemide 40 Mg Tabs (Furosemide) .[name].. 1 by mouth once daily 7)  Klor-con 10 10 Meq Tbcr (Potassium chloride) .... 3 by mouth once daily 8)  Bystolic 20 Mg Tabs (Nebivolol hcl) .[name].. 1 by mouth  once daily 9)  Adult Aspirin Ec Low Strength 81 Mg Tbec (Aspirin) .[name].. 1 by mouth once daily 10)  Mucinex Maximum Strength 1200 Mg Tb12 (Guaifenesin) .[name].. 1 by mouth two times a day as needed 11)  Gfn 550/pse 60 550-60 Mg Xr12h-tab (Pseudoephedrine-guaifenesin) .[name].. 1 by mouth two times a day 12)  Glimepiride 4 Mg Tabs (Glimepiride) .... Take 2 by mouth qam 13)  Metformin Hcl 500 Mg Xr24h-tab (Metformin hcl) .... 3 by mouth once daily 14)  Lancets Misc (Lancets) .... Use asd 1 once daily 15)  Diltiazem Hcl Cr 240 Mg Xr24h-cap (Diltiazem hcl) .[name].. 1po once daily 16)  Hydralazine Hcl 50 Mg Tabs (Hydralazine hcl) .[name].. 1 by mouth three times a day 17)  Alprazolam 0.5 Mg Tabs (Alprazolam) .... 1/2 - 1 by mouth once daily as needed 18)  Metronidazole 250 Mg Tabs (Metronidazole) .[name].. 1po four times per day 19)  Tramadol Hcl 50 Mg Tabs (Tramadol hcl) .[name].. 1 - 2  by mouth q 6 hrs as needed pain 20)  Ciprofloxacin Hcl 500 Mg Tabs (Ciprofloxacin hcl) .[name].. 1 by mouth two times a day  Patient Instructions: 1)  stop the actos 2)  hold taking the simvastatin 3)  for now, you can take the tramadol for the right knee pain 4)  Continue all previous medications as before this visit, except to reduce the bystolic to HALF pill if BP is consistently <  120/90 5)  You will be contacted about the referral(s) to: stress test 6)  Please schedule a follow-up appointment in 6 months with: 7)  BMP prior to visit, ICD-9: 250.02 8)  Lipid Panel prior to visit, ICD-9: 9)  HbgA1C prior to visit, ICD-9: Prescriptions: TRAMADOL HCL 50 MG TABS (TRAMADOL HCL) 1 - 2 by mouth q 6 hrs as needed pain  #60 x 1   Entered and Authorized by:   Corwin Levins MD   Signed by:   Corwin Levins MD on 04/07/2010   Method used:   Print then Give to Patient   RxID:   [id number]    Orders Added: 1)  EKG w/ Interpretation [93000] 2)  Cardiolite [Cardiolite] 3)  Est. Patient 40-64 years [84696]

## 2010-06-20 NOTE — Assessment & Plan Note (Signed)
Summary: CYST ON BACK/NWS   Vital Signs:  Patient Profile:   60 Years Old Male Weight:      236 pounds Temp:     97.6 degrees F oral Pulse rate:   64 / minute BP sitting:   148 / 98  (right arm) Cuff size:   large  Pt. in pain?   no  Vitals Entered By: Maris Berger (August 01, 2007 2:29 PM)                  Chief Complaint:  cyst on back.  History of Present Illness: here with 2 wks sudden drainage and swelling without pain or fever to area of the lower lumbar area; tolerating the increased dm meds without polys or low sugars    Updated Prior Medication List: ALLOPURINOL 300 MG TABS (ALLOPURINOL) Take 1 tablet by mouth once a day CIALIS 20 MG TABS (TADALAFIL) Take 1 tablet by mouth once a day HYDROCODONE-ACETAMINOPHEN 5-500 MG TABS (HYDROCODONE-ACETAMINOPHEN) Take 1 tablet by mouth four times a day PERMETHRIN 5 % CREA (PERMETHRIN)  PRILOSEC OTC 20 MG TBEC (OMEPRAZOLE MAGNESIUM) Take 1 tablet by mouth once a day SIMVASTATIN 40 MG TABS (SIMVASTATIN) Take 1 tablet by mouth once a day BENICAR 40 MG  TABS (OLMESARTAN MEDOXOMIL) Take 1 tablet by mouth once a day FUROSEMIDE 40 MG  TABS (FUROSEMIDE) 1 by mouth bid GLIMEPIRIDE 2 MG  TABS (GLIMEPIRIDE) 1 by mouth qd AMLODIPINE BESYLATE 10 MG  TABS (AMLODIPINE BESYLATE) 1 by mouth qd TRIAMCINOLONE ACETONIDE 0.5 %  CREA (TRIAMCINOLONE ACETONIDE) use asd  two times a day prn DOXYCYCLINE HYCLATE 100 MG  TABS (DOXYCYCLINE HYCLATE) 1po bid  Current Allergies (reviewed today): ! ACCUPRIL ! Judye Bos  Past Medical History:    Reviewed history from 06/25/2007 and no changes required:       Colonic polyps, hx of       Diabetes mellitus, type II       Hypertension       GERD       insomnia       Gout       E.D.       Hyperlipidemia       Anxiety       Allergic rhinitis       IBS       Depression       recurrent peptic stricture       hx of shingles       chronic mild elev LFT's  Past Surgical History:    Reviewed  history from 06/25/2007 and no changes required:       Inguinal herniorrhaphy       s/p septoplasty   Social History:    Reviewed history from 06/25/2007 and no changes required:       Never Smoked       Alcohol use-no     Physical Exam  General:     Well-developed,well-nourished,in no acute distress; alert,appropriate and cooperative throughout examination Head:     Normocephalic and atraumatic without obvious abnormalities. No apparent alopecia or balding. Eyes:     No corneal or conjunctival inflammation noted. EOMI. Perrla. Ears:     External ear exam shows no significant lesions or deformities.  Otoscopic examination reveals clear canals, tympanic membranes are intact bilaterally without bulging, retraction, inflammation or discharge. Hearing is grossly normal bilaterally. Nose:     External nasal examination shows no deformity or inflammation. Nasal mucosa are pink and moist without lesions  or exudates. Mouth:     Oral mucosa and oropharynx without lesions or exudates.  Teeth in good repair. Neck:     No deformities, masses, or tenderness noted. Lungs:     Normal respiratory effort, chest expands symmetrically. Lungs are clear to auscultation, no crackles or wheezes. Heart:     Normal rate and regular rhythm. S1 and S2 normal without gallop, murmur, click, rub or other extra sounds. Extremities:     no edema, no ulcers  Skin:     large approx 3 cm area non-tender likely sebac cyst  with active drainage, mildly cloudy drainage    Impression & Recommendations:  Problem # 1:  SEBACEOUS CYST, INFECTED (ICD-706.2) minimal at this point with good drainage; will give antibx for any worsening, and refer to gen surg for definitive management Orders: Surgical Referral (Surgery)   Problem # 2:  HYPERTENSION (ICD-401.9)  His updated medication list for this problem includes:    Benicar 40 Mg Tabs (Olmesartan medoxomil) .[name]... Take 1 tablet by mouth once a day     Furosemide 40 Mg Tabs (Furosemide) .[name]... 1 by mouth bid    Amlodipine Besylate 10 Mg Tabs (Amlodipine besylate) .[name]... 1 by mouth qd  BP today: 148/98 Prior BP: 140/87 (06/25/2007)  Labs Reviewed: Creat: 1.1 (06/25/2007) Chol: 126 (06/25/2007)   HDL: 26.5 (06/25/2007)   LDL: 69 (06/25/2007)   TG: 155 (06/25/2007)  mild elev today, likely situational, ok to follow, cont wt loss efforts   Problem # 3:  DIABETES MELLITUS, TYPE II (ICD-250.00)  His updated medication list for this problem includes:    Benicar 40 Mg Tabs (Olmesartan medoxomil) .[name]... Take 1 tablet by mouth once a day    Glimepiride 2 Mg Tabs (Glimepiride) .[name]... 1 by mouth qd improved by hx, cont meds, will eventually need a1c Labs Reviewed: HgBA1c: 7.0 (06/25/2007)   Creat: 1.1 (06/25/2007)      Problem # 4:  HYPERLIPIDEMIA (ICD-272.4)  His updated medication list for this problem includes:    Simvastatin 40 Mg Tabs (Simvastatin) .[name]... Take 1 tablet by mouth once a day  Labs Reviewed: Chol: 126 (06/25/2007)   HDL: 26.5 (06/25/2007)   LDL: 69 (06/25/2007)   TG: 155 (06/25/2007) SGOT: 47 (12/20/2006)   SGPT: 60 (12/20/2006) stable, cont meds as is  Complete Medication List: 1)  Allopurinol 300 Mg Tabs (Allopurinol) .... Take 1 tablet by mouth once a day 2)  Cialis 20 Mg Tabs (Tadalafil) .... Take 1 tablet by mouth once a day 3)  Hydrocodone-acetaminophen 5-500 Mg Tabs (Hydrocodone-acetaminophen) .... Take 1 tablet by mouth four times a day 4)  Permethrin 5 % Crea (Permethrin) 5)  Prilosec Otc 20 Mg Tbec (Omeprazole magnesium) .... Take 1 tablet by mouth once a day 6)  Simvastatin 40 Mg Tabs (Simvastatin) .... Take 1 tablet by mouth once a day 7)  Benicar 40 Mg Tabs (Olmesartan medoxomil) .... Take 1 tablet by mouth once a day 8)  Furosemide 40 Mg Tabs (Furosemide) .[name].. 1 by mouth bid 9)  Glimepiride 2 Mg Tabs (Glimepiride) .[name].. 1 by mouth qd 10)  Amlodipine Besylate 10 Mg Tabs (Amlodipine besylate) .[name].. 1 by mouth  qd 11)  Triamcinolone Acetonide 0.5 % Crea (Triamcinolone acetonide) .... Use asd  two times a day prn 12)  Doxycycline Hyclate 100 Mg Tabs (Doxycycline hyclate) .[name].. 1po bid   Patient Instructions: 1)  take the antibiotic for any worsening pain, swelling or fever 2)  you will be contacted about the referral  to the surgeon (dr toth if possible) 3)  followup as per last recommendations    Prescriptions: DOXYCYCLINE HYCLATE 100 MG  TABS (DOXYCYCLINE HYCLATE) 1po bid  #20 x 0   Entered and Authorized by:   Corwin Levins MD   Signed by:   Corwin Levins MD on 08/01/2007   Method used:   Print then Give to Patient   RxID:   [id number]  ]

## 2010-06-20 NOTE — Miscellaneous (Signed)
Summary: Orders Update  Medications Added FUROSEMIDE 40 MG  TABS (FUROSEMIDE) 1 by mouth two times a day       Clinical Lists Changes  Problems: Added new problem of UNSPECIFIED DISORDER OF KIDNEY AND URETER (ICD-593.9) Medications: Changed medication from FUROSEMIDE 40 MG  TABS (FUROSEMIDE) 1 by mouth once daily to FUROSEMIDE 40 MG  TABS (FUROSEMIDE) 1 by mouth two times a day - Signed Rx of FUROSEMIDE 40 MG  TABS (FUROSEMIDE) 1 by mouth two times a day;  #60 x 11;  Signed;  Entered by: Corwin Levins MD;  Authorized by: Corwin Levins MD;  Method used: Electronically to [facility]. [phone]*, [address] [place], [place], [place]  [address], Ph: [phone], Fax: [phone] Orders: Added new Referral order of Radiology Referral (Radiology) - Signed    Prescriptions: FUROSEMIDE 40 MG  TABS (FUROSEMIDE) 1 by mouth two times a day  #60 x 11   Entered and Authorized by:   Corwin Levins MD   Signed by:   Corwin Levins MD on 10/13/2009   Method used:   Electronically to        [facility]. [phone]* (retail)       [address]       [place], [place]  [address]       Ph: [phone]       Fax: [phone]   RxID:   [id number]

## 2010-06-20 NOTE — Progress Notes (Signed)
  Phone Note Refill Request   Refills Requested: Medication #1:  METFORMIN HCL 500 MG XR24H-TAB 3 by mouth once daily   Dosage confirmed as above?Dosage Confirmed Initial call taken by: Scharlene Gloss,  March 23, 2009 10:57 AM    Prescriptions: METFORMIN HCL 500 MG XR24H-TAB (METFORMIN HCL) 3 by mouth once daily  #90 x 11   Entered by:   Zella Ball Ewing   Authorized by:   Corwin Levins MD   Signed by:   Scharlene Gloss on 03/23/2009   Method used:   Electronically to        [facility]. [phone]* (retail)       [address]       [place], [place]  [address]       Ph: [phone]       Fax: [phone]   RxID:   [id number]

## 2010-06-20 NOTE — Letter (Signed)
Summary: [facility]   Imported By: Lester [place] 03/15/2010 08:38:22  _____________________________________________________________________  External Attachment:    Type:   Image     Comment:   External Document

## 2010-06-20 NOTE — Progress Notes (Signed)
Summary: Call Report  Phone Note Other Incoming   Caller: [facility] Summary of Call: [facility] Triage Call Report Triage Record Num: [id number] Operator: Karenann Cai Patient Name: Evans Levee Call Date & Time: 02/14/2010 8:53:26PM Patient Phone: [phone] PCP: Oliver Barre Patient Gender: Male PCP Fax : [phone] Patient DOB: [birth year] Practice Name: [facility] Reason for Call: Bill/Friend is calling to find prescription. [facility] at [phone] on [address] and is a Press photographer. Pharmacy did not receive prescription. RN contacted MD on call. MD on call will call CAN when she arrives home and can access EMR. MD on her way home and will call once she arrives home and can call in needed prescription. Bill/Friend has gone home and will go back to pick up prescription once MD calls in prescription. Dr. Laury Axon is MD on call. Friend's cell phone is listed. Prescription: ? Cipro. Protocol(s) Used: Office Note Recommended Outcome per Protocol: Information Noted and Sent to Office Reason for Outcome: Caller information to office Care Advice:  ~ Initial call taken by: Margaret Pyle, CMA,  February 15, 2010 10:36 AM  Follow-up for Phone Call        noted Follow-up by: Corwin Levins MD,  February 15, 2010 10:56 AM     Appended Document: Call Report nurse spoke to pt to relay test results - pt ruquests pain meds  - done hardcopy to LIM side B - dahlia

## 2010-06-20 NOTE — Progress Notes (Signed)
Summary: ER ?  Phone Note Call from Patient Call back at [facility] Phone [phone]   Summary of Call: If pt needs to go to the ER over the weekend, what does he need to ask for or tell them? Or expect from the ER?  Initial call taken by: Lamar Sprinkles, CMA,  Sep 30, 2009 2:17 PM  Follow-up for Phone Call        remember as we discussed - if has persistent HA, confusion, chest pain, sob, blood in the urine or no urine, or any other unsual pain  they would check BP and likely recommend him for admission for IV BP meds Follow-up by: Corwin Levins MD,  Sep 30, 2009 2:29 PM  Additional Follow-up for Phone Call Additional follow up Details #1::        informed pt Additional Follow-up by: Ami Bullins CMA,  Sep 30, 2009 3:37 PM

## 2010-06-20 NOTE — Progress Notes (Signed)
  Phone Note From Other Clinic   Caller: call a nurse Call For: Nathan Medina Summary of Call: Pt was seen today and given Flagyl---he later received a call that his WBC was elevated and was told Cipro would be called in --The pharmacy never received it ---  Cipro 500mg  two times a day for 10 days was sent to pharmacy after reviewing chart. Initial call taken by: Loreen Freud DO,  February 14, 2010 9:32 PM    New/Updated Medications: CIPRO 500 MG TABS (CIPROFLOXACIN HCL) 1 by mouth two times a day Prescriptions: CIPRO 500 MG TABS (CIPROFLOXACIN HCL) 1 by mouth two times a day  #20 x 0   Entered and Authorized by:   Loreen Freud DO   Signed by:   Loreen Freud DO on 02/14/2010   Method used:   Electronically to        [facility]. [phone]* (retail)       [address]       [place], [place]  [address]       Ph: [phone]       Fax: [phone]   RxID:   [id number]

## 2010-06-20 NOTE — Consult Note (Signed)
Summary: [facility]   Imported By: Lennie Odor 11/22/2009 15:42:22  _____________________________________________________________________  External Attachment:    Type:   Image     Comment:   External Document

## 2010-06-20 NOTE — Assessment & Plan Note (Signed)
Summary: F/U on BP/LMB   Vital Signs:  Patient profile:   60 year old male Height:      68 inches Weight:      224.50 pounds BMI:     34.26 O2 Sat:      95 % on Room air Temp:     98.2 degrees F oral Pulse rate:   52 / minute BP sitting:   152 / 84  (left arm) Cuff size:   large  Vitals Entered ByZella Ball Ewing (Oct 03, 2009 10:16 AM)  O2 Flow:  Room air CC: Followup on BP/RE   CC:  Followup on BP/RE.  History of Present Illness: here to f/u;  has excellent compliance with meds including the recent lower dose hydralazine;  takes BP at home accurately and frequently - despite all currnet meds BP still just over 160 to 170 sbp prior to  today and relatively non labile;  as per last visit Pt denies CP, sob, doe, wheezing, orthopnea, pnd, worsening LE edema, palps, dizziness or syncope   Pt denies new neuro symptoms such as headache, facial or extremity weakness , and no other complaints.  Trhying to follow lower salt diet.    Problems Prior to Update: 1)  Plantar Fasciitis, Right  (ICD-728.71) 2)  Rash-nonvesicular  (ICD-782.1) 3)  Transaminases, Serum, Elevated  (ICD-790.4) 4)  Other Specified Forms of Hearing Loss  (ICD-389.8) 5)  Hypokalemia  (ICD-276.8) 6)  Plantar Wart  (ICD-078.12) 7)  Electrocardiogram, Abnormal  (ICD-794.31) 8)  Preventive Health Care  (ICD-V70.0) 9)  Sebaceous Cyst, Infected  (ICD-706.2) 10)  Depression  (ICD-311) 11)  Ibs  (ICD-564.1) 12)  Allergic Rhinitis  (ICD-477.9) 13)  Anxiety  (ICD-300.00) 14)  Hyperlipidemia  (ICD-272.4) 15)  Gout  (ICD-274.9) 16)  Gerd  (ICD-530.81) 17)  Rash-nonvesicular  (ICD-782.1) 18)  Hypertension  (ICD-401.9) 19)  Diabetes Mellitus, Type II  (ICD-250.00) 20)  Sinusitis- Acute-nos  (ICD-461.9) 21)  Colonic Polyps, Hx of  (ICD-V12.72)  Medications Prior to Update: 1)  Allopurinol 300 Mg Tabs (Allopurinol) .... Take 1 Tablet By Mouth Once A Day 2)  Cialis 20 Mg Tabs (Tadalafil) .... Take 1 Tablet By Mouth Once A  Day Prn 3)  Prilosec Otc 20 Mg Tbec (Omeprazole Magnesium) .... Take 1 Tablet By Mouth Once A Day 4)  Simvastatin 40 Mg Tabs (Simvastatin) .... Take 1 Tablet By Mouth Once A Day 5)  Micardis 80 Mg Tabs (Telmisartan) .[name].. 1 By Mouth Once Daily 6)  Furosemide 40 Mg  Tabs (Furosemide) .[name].. 1 By Mouth Once Daily 7)  Lotrisone 1-0.05 % Crea (Clotrimazole-Betamethasone) .... Use Asd Two Times A Day As Needed 8)  Klor-Con 10 10 Meq  Tbcr (Potassium Chloride) .... 3 By Mouth Once Daily 9)  Bystolic 20 Mg Tabs (Nebivolol Hcl) .[name].. 1 By Mouth  Once Daily 10)  Adult Aspirin Ec Low Strength 81 Mg  Tbec (Aspirin) .[name].. 1 By Mouth Once Daily 11)  Mucinex Maximum Strength 1200 Mg  Tb12 (Guaifenesin) .[name].. 1 By Mouth Two Times A Day As Needed 12)  Gfn 550/pse 60 550-60 Mg Xr12h-Tab (Pseudoephedrine-Guaifenesin) .[name].. 1 By Mouth Two Times A Day 13)  Glimepiride 4 Mg Tabs (Glimepiride) .... Take 2 By Mouth Qam 14)  Metformin Hcl 500 Mg Xr24h-Tab (Metformin Hcl) .... 3 By Mouth Once Daily 15)  Accu-Chek Aviva  Strp (Glucose Blood) .... Use Asd Two Times A Day 16)  Lancets  Misc (Lancets) .... Use Asd 1 Once Daily 17)  Actos  45 Mg Tabs (Pioglitazone Hcl) .[name].. 1po Once Daily 18)  Diltiazem Hcl Cr 240 Mg Xr24h-Cap (Diltiazem Hcl) .[name].. 1po Once Daily 19)  Hydralazine Hcl 25 Mg Tabs (Hydralazine Hcl) .[name].. 1 By Mouth Three Times A Day  Current Medications (verified): 1)  Allopurinol 300 Mg Tabs (Allopurinol) .... Take 1 Tablet By Mouth Once A Day 2)  Cialis 20 Mg Tabs (Tadalafil) .... Take 1 Tablet By Mouth Once A Day Prn 3)  Prilosec Otc 20 Mg Tbec (Omeprazole Magnesium) .... Take 1 Tablet By Mouth Once A Day 4)  Simvastatin 40 Mg Tabs (Simvastatin) .... Take 1 Tablet By Mouth Once A Day 5)  Micardis 80 Mg Tabs (Telmisartan) .[name].. 1 By Mouth Once Daily 6)  Furosemide 40 Mg  Tabs (Furosemide) .[name].. 1 By Mouth Once Daily 7)  Lotrisone 1-0.05 % Crea (Clotrimazole-Betamethasone) .... Use Asd Two Times A Day As Needed 8)   Klor-Con 10 10 Meq  Tbcr (Potassium Chloride) .... 3 By Mouth Once Daily 9)  Bystolic 20 Mg Tabs (Nebivolol Hcl) .[name].. 1 By Mouth  Once Daily 10)  Adult Aspirin Ec Low Strength 81 Mg  Tbec (Aspirin) .[name].. 1 By Mouth Once Daily 11)  Mucinex Maximum Strength 1200 Mg  Tb12 (Guaifenesin) .[name].. 1 By Mouth Two Times A Day As Needed 12)  Gfn 550/pse 60 550-60 Mg Xr12h-Tab (Pseudoephedrine-Guaifenesin) .[name].. 1 By Mouth Two Times A Day 13)  Glimepiride 4 Mg Tabs (Glimepiride) .... Take 2 By Mouth Qam 14)  Metformin Hcl 500 Mg Xr24h-Tab (Metformin Hcl) .... 3 By Mouth Once Daily 15)  Lancets  Misc (Lancets) .... Use Asd 1 Once Daily 16)  Actos 45 Mg Tabs (Pioglitazone Hcl) .[name].. 1po Once Daily 17)  Diltiazem Hcl Cr 240 Mg Xr24h-Cap (Diltiazem Hcl) .[name].. 1po Once Daily 18)  Hydralazine Hcl 50 Mg Tabs (Hydralazine Hcl) .[name].. 1 By Mouth Three Times A Day  Allergies (verified): 1)  ! Accupril 2)  ! * Lexapro 3)  * Glimeparide  Past History:  Past Medical History: Last updated: 06/25/2007 Colonic polyps, hx of Diabetes mellitus, type II Hypertension GERD insomnia Gout E.D. 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   Head:  normocephalic and atraumatic.   Eyes:  vision grossly intact, pupils equal, and pupils round.   Ears:  R ear normal and L ear normal.   Nose:  no external deformity and no nasal discharge.   Mouth:  no gingival abnormalities and pharynx pink and moist.   Neck:  supple and no masses.   Lungs:   normal respiratory effort and normal breath sounds.   Heart:  normal rate and regular rhythm.   Abdomen:  soft, non-tender, and normal bowel sounds.   Extremities:  no edema, no erythema  Neurologic:  strength normal in all extremities and gait normal.     Impression & Recommendations:  Problem # 1:  HYPERTENSION (ICD-401.9)  His updated medication list for this problem includes:    Micardis 80 Mg Tabs (Telmisartan) .[name]... 1 by mouth once daily    Furosemide 40  Mg Tabs (Furosemide) .[name]... 1 by mouth once daily    Bystolic 20 Mg Tabs (Nebivolol hcl) .[name]... 1 by mouth  once daily    Diltiazem Hcl Cr 240 Mg Xr24h-cap (Diltiazem hcl) .[name]... 1po once daily    Hydralazine Hcl 50 Mg Tabs (Hydralazine hcl) .[name]... 1 by mouth three times a day  Orders: Radiology Referral (Radiology) uncontrolled -  to incr the hydralazine to 50 three times a day, check renal u/s/renal arteries;  consider renal referral for HTN, cont focus on low salt diet, wt loss  Complete Medication List: 1)  Allopurinol 300 Mg Tabs (Allopurinol) .... Take 1 tablet by mouth once a day 2)  Cialis 20 Mg Tabs (Tadalafil) .... Take 1 tablet by mouth once a day prn 3)  Prilosec Otc 20 Mg Tbec (Omeprazole magnesium) .... Take 1 tablet by mouth once a day 4)  Simvastatin 40 Mg Tabs (Simvastatin) .... Take 1 tablet by mouth once a day 5)  Micardis 80 Mg Tabs (Telmisartan) .[name].. 1 by mouth once daily 6)  Furosemide 40 Mg Tabs (Furosemide) .[name].. 1 by mouth once daily 7)  Lotrisone 1-0.05 % Crea (Clotrimazole-betamethasone) .... Use asd two times a day as needed 8)  Klor-con 10 10 Meq Tbcr (Potassium chloride) .... 3 by mouth once daily 9)  Bystolic 20 Mg Tabs (Nebivolol hcl) .[name].. 1 by mouth  once daily 10)  Adult Aspirin Ec Low Strength 81 Mg Tbec (Aspirin) .[name].. 1 by mouth once daily 11)  Mucinex Maximum Strength 1200 Mg Tb12 (Guaifenesin) .[name].. 1 by mouth two times a day as needed 12)  Gfn 550/pse 60 550-60 Mg Xr12h-tab  (Pseudoephedrine-guaifenesin) .[name].. 1 by mouth two times a day 13)  Glimepiride 4 Mg Tabs (Glimepiride) .... Take 2 by mouth qam 14)  Metformin Hcl 500 Mg Xr24h-tab (Metformin hcl) .... 3 by mouth once daily 15)  Lancets Misc (Lancets) .... Use asd 1 once daily 16)  Actos 45 Mg Tabs (Pioglitazone hcl) .[name].. 1po once daily 17)  Diltiazem Hcl Cr 240 Mg Xr24h-cap (Diltiazem hcl) .[name].. 1po once daily 18)  Hydralazine Hcl 50 Mg Tabs (Hydralazine hcl) .[name].. 1 by mouth three times a day  Patient Instructions: 1)  increase the hydralazine to 50 mg three times a day  2)  Continue all previous medications as before this visit  3)  You will be contacted about the referral(s) to: Kidney artery ultrasound 4)  Please schedule a follow-up appointment in 1 month. 5)  please cont to monitor your BP at home;  please call in 1 wk if the average SBP is > 160 Prescriptions: HYDRALAZINE HCL 50 MG TABS (HYDRALAZINE HCL) 1 by mouth three times a day  #90 x 11   Entered and Authorized by:   Corwin Levins MD   Signed by:   Corwin Levins MD on 10/03/2009   Method used:   Print then Give to Patient   RxID:   [id number]

## 2010-06-20 NOTE — Assessment & Plan Note (Signed)
Summary: STOMACH CRAMPS/ FEVER/NWS   Vital Signs:  Patient profile:   60 year old male Height:      68 inches Weight:      232.38 pounds BMI:     35.46 O2 Sat:      92 % on Room air Temp:     102.3 degrees F oral Pulse rate:   86 / minute BP sitting:   140 / 78  (left arm) Cuff size:   large  Vitals Entered By: Zella Ball Ewing CMA Duncan Dull) (February 14, 2010 3:33 PM)  O2 Flow:  Room air CC: Fever, Diarrhea, stomach pain, ears stopped up/RE   CC:  Fever, Diarrhea, stomach pain, and ears stopped up/RE.  History of Present Illness: here with acute illness, with 3 days onset marked fever , abd pain with crampy pains and watery diarrhea, mult episodes per day; without radiation to the back, n/v, or GU symtpoms such as dysuria or frequency.  Fortunately has been able to keep up well with fluids and denies orthostasis.  No blood in diarrhea.  Has tried OTC mucinex, gasx, laxative and alka seltzer which did not help, seemed to make thinks worse.  No chills , rigors, rash or joint pains.  Pt denies CP, worsening sob, doe, wheezing, orthopnea, pnd, worsening LE edema, palps, dizziness or syncope  Pt denies new neuro symptoms such as headache, facial or extremity weakness  Pt denies polydipsia, polyuria, or low sugar symptoms such as shakiness improved with eating.  Overall good compliance with meds, trying to follow low chol, DM diet, wt stable, little excercise however  has held off on taking his meds today since he was not eating well otherwise.  Though appears ill today, he believes he may actually be somewhat better today.  Ears have been somewhat stopped up as well .  Also was never able to change the micardis to the benicar due to insurance not paying.    Problems Prior to Update: 1)  Diarrhea of Presumed Infectious Origin  (ICD-009.3) 2)  Abdominal Pain, Unspecified Site  (ICD-789.00) 3)  Peripheral Edema  (ICD-782.3) 4)  Unspecified Disorder of Kidney and Ureter  (ICD-593.9) 5)  Plantar  Fasciitis, Right  (ICD-728.71) 6)  Rash-nonvesicular  (ICD-782.1) 7)  Transaminases, Serum, Elevated  (ICD-790.4) 8)  Other Specified Forms of Hearing Loss  (ICD-389.8) 9)  Hypokalemia  (ICD-276.8) 10)  Plantar Wart  (ICD-078.12) 11)  Electrocardiogram, Abnormal  (ICD-794.31) 12)  Preventive Health Care  (ICD-V70.0) 13)  Sebaceous Cyst, Infected  (ICD-706.2) 14)  Depression  (ICD-311) 15)  Ibs  (ICD-564.1) 16)  Allergic Rhinitis  (ICD-477.9) 17)  Anxiety  (ICD-300.00) 18)  Hyperlipidemia  (ICD-272.4) 19)  Gout  (ICD-274.9) 20)  Gerd  (ICD-530.81) 21)  Rash-nonvesicular  (ICD-782.1) 22)  Hypertension  (ICD-401.9) 23)  Diabetes Mellitus, Type II  (ICD-250.00) 24)  Sinusitis- Acute-nos  (ICD-461.9) 25)  Colonic Polyps, Hx of  (ICD-V12.72)  Medications Prior to Update: 1)  Allopurinol 300 Mg Tabs (Allopurinol) .... Take 1 Tablet By Mouth Once A Day 2)  Cialis 20 Mg Tabs (Tadalafil) .... Take 1 Tablet By Mouth Once A Day Prn 3)  Prilosec Otc 20 Mg Tbec (Omeprazole Magnesium) .... Take 1 Tablet By Mouth Once A Day 4)  Simvastatin 40 Mg Tabs (Simvastatin) .... Take 1 Tablet By Mouth Once A Day 5)  Benicar 40 Mg Tabs (Olmesartan Medoxomil) .[name].. 1po Once Daily 6)  Furosemide 40 Mg  Tabs (Furosemide) .[name].. 1 By Mouth Two Times A Day 7)  Klor-Con 10 10 Meq  Tbcr (Potassium Chloride) .... 3 By Mouth Once Daily 8)  Bystolic 20 Mg Tabs (Nebivolol Hcl) .[name].. 1 By Mouth  Once Daily 9)  Adult Aspirin Ec Low Strength 81 Mg  Tbec (Aspirin) .[name].. 1 By Mouth Once Daily 10)  Mucinex Maximum Strength 1200 Mg  Tb12 (Guaifenesin) .[name].. 1 By Mouth Two Times A Day As Needed 11)  Gfn 550/pse 60 550-60 Mg Xr12h-Tab (Pseudoephedrine-Guaifenesin) .[name].. 1 By Mouth Two Times A Day 12)  Glimepiride 4 Mg Tabs (Glimepiride) .... Take 2 By Mouth Qam 13)  Metformin Hcl 500 Mg Xr24h-Tab (Metformin Hcl) .... 3 By Mouth Once Daily 14)  Lancets  Misc (Lancets) .... Use Asd 1 Once Daily 15)  Actos 30 Mg Tabs (Pioglitazone Hcl)  .[name].. 1 By Mouth Once Daily 16)  Diltiazem Hcl Cr 240 Mg Xr24h-Cap (Diltiazem Hcl) .[name].. 1po Once Daily 17)  Hydralazine Hcl 50 Mg Tabs (Hydralazine Hcl) .[name].. 1 By Mouth Three Times A Day 18)  Alprazolam 0.5 Mg Tabs (Alprazolam) .... 1/2 - 1 By Mouth Once Daily As Needed  Current Medications (verified): 1)  Allopurinol 300 Mg Tabs (Allopurinol) .... Take 1 Tablet By Mouth Once A Day 2)  Cialis 20 Mg Tabs (Tadalafil) .... Take 1 Tablet By Mouth Once A Day Prn 3)  Prilosec Otc 20 Mg Tbec (Omeprazole Magnesium) .... Take 1 Tablet By Mouth Once A Day 4)  Simvastatin 40 Mg Tabs (Simvastatin) .... Take 1/2 Tablet By Mouth Once A Day 5)  Micardis 80 Mg Tabs (Telmisartan) .[name].. 1po Once Daily 6)  Furosemide 40 Mg  Tabs (Furosemide) .[name].. 1 By Mouth Two Times A Day 7)  Klor-Con 10 10 Meq  Tbcr (Potassium Chloride) .... 3 By Mouth Once Daily 8)  Bystolic 20 Mg Tabs (Nebivolol Hcl) .[name].. 1 By Mouth  Once Daily 9)  Adult Aspirin Ec Low Strength 81 Mg  Tbec (Aspirin) .[name].. 1 By Mouth Once Daily 10)  Mucinex Maximum Strength 1200 Mg  Tb12 (Guaifenesin) .[name].. 1 By Mouth Two Times A Day As Needed 11)  Gfn 550/pse 60 550-60 Mg Xr12h-Tab (Pseudoephedrine-Guaifenesin) .[name].. 1 By Mouth Two Times A Day 12)  Glimepiride 4 Mg Tabs (Glimepiride) .... Take 2 By Mouth Qam 13)  Metformin Hcl 500 Mg Xr24h-Tab (Metformin Hcl) .... 3 By Mouth Once Daily 14)  Lancets  Misc (Lancets) .... Use Asd 1 Once Daily 15)  Actos 30 Mg Tabs (Pioglitazone Hcl) .[name].. 1 By Mouth Once Daily 16)  Diltiazem Hcl Cr 240 Mg Xr24h-Cap (Diltiazem Hcl) .[name].. 1po Once Daily 17)  Hydralazine Hcl 50 Mg Tabs (Hydralazine Hcl) .[name].. 1 By Mouth Three Times A Day 18)  Alprazolam 0.5 Mg Tabs (Alprazolam) .... 1/2 - 1 By Mouth Once Daily As Needed 19)  Metronidazole 250 Mg Tabs (Metronidazole) .[name].. 1po Four Times Per Day  Allergies (verified): 1)  ! Accupril 2)  ! * Lexapro 3)  * Glimeparide  Past History:  Past Medical History: Last updated:  06/25/2007 Colonic polyps, hx of Diabetes mellitus, type II Hypertension GERD insomnia Gout E.D. 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         all otherwise negative per pt -    Physical Exam  General:  alert and overweight-appearing.  , mild to mod ill appearing, weak but able to ascend exam table with little difficulty Head:  normocephalic and atraumatic.   Eyes:  vision grossly intact, pupils equal, and pupils round.   Ears:  R ear normal and L ear normal.   Nose:  no external deformity and no nasal discharge.   Mouth:  no gingival abnormalities and pharynx pink and moist.   Neck:  supple and no masses.   Lungs:  normal respiratory effort and normal breath sounds.   Heart:  normal rate and regular rhythm.   Abdomen:  marked tender rlq, and mid and llq as well , no guarding or rebound, pos BS , non rigid Msk:  no joint tenderness and no joint swelling.   Extremities:  no edema, no erythema  Neurologic:  no confused, motor gross normal Skin:  no rashes.     Impression & Recommendations:  Problem # 1:  ABDOMINAL PAIN, UNSPECIFIED SITE  (ICD-789.00)  mostly lower abd , with fever and diarrhea  - mostly watery without blood per pt - will check labs, ua, stool studies, and ct abd /pelvis  - ? colitis vs other ;  for flagyl course for ? c diff given the severity of the illness, consider cipro pending lab results, consider ER for any worsening signs/symtpoms  Orders: T-Culture, Urine ([id number]) Radiology Referral (Radiology) TLB-Udip w/ Micro ([id number]-[id number])  Problem # 2:  HYPERTENSION (ICD-401.9)  His updated medication list for this problem includes:    Micardis 80 Mg Tabs (Telmisartan) .[name]... 1po once daily    Furosemide 40 Mg Tabs (Furosemide) .[name]... 1 by mouth two times a day    Bystolic 20 Mg Tabs (Nebivolol hcl) .[name]... 1 by mouth  once daily    Diltiazem Hcl Cr 240 Mg Xr24h-cap (Diltiazem hcl) .[name]... 1po once daily    Hydralazine Hcl 50 Mg Tabs (Hydralazine hcl) .[name]... 1 by mouth three times a day  BP today: 140/78 Prior BP: 152/92 (11/04/2009)  Labs Reviewed: K+: 3.7 (09/29/2009) Creat: : 1.1 (09/29/2009)   Chol: 129 (03/28/2009)   HDL: 30.60 (03/28/2009)   LDL: DEL (03/18/2008)   TG: 254.0 (03/28/2009) stable overall by hx and exam, ok to continue meds/tx as is , still on micardis due to insurance issue  Problem # 3:  DIABETES MELLITUS, TYPE II (ICD-250.00)  His updated medication list for this problem includes:    Micardis 80 Mg Tabs (Telmisartan) .[name]... 1po once daily    Adult Aspirin Ec Low Strength 81 Mg Tbec (Aspirin) .[name]... 1 by mouth once daily    Glimepiride 4 Mg Tabs (Glimepiride) .[name]... Take 2 by mouth qam    Metformin Hcl 500 Mg Xr24h-tab (Metformin hcl) .Marland KitchenMarland KitchenMarland KitchenMarland Kitchen 3 by mouth once daily    Actos 30 Mg Tabs (Pioglitazone hcl) .[name]... 1 by mouth once daily ok to hold meds if not taking by mouth well, cont to monitor sugars, call for . 200  Labs Reviewed: Creat: 1.1 (09/29/2009)    Reviewed HgBA1c results: 6.0 (09/29/2009)  9.8 (03/28/2009)  Problem # 4:  HYPERLIPIDEMIA (ICD-272.4)  His updated medication  list for this problem  includes:    Simvastatin 40 Mg Tabs (Simvastatin) .[name]... Take 1/2 tablet by mouth once a day for HALF statin now as is on diltiazem   Labs Reviewed: SGOT: 51 (03/28/2009)   SGPT: 66 (03/28/2009)   HDL:30.60 (03/28/2009), 26.30 (10/11/2008)  LDL:DEL (03/18/2008), 69 (04/54/0981)  Chol:129 (03/28/2009), 130 (10/11/2008)  Trig:254.0 (03/28/2009), 263.0 (10/11/2008)  Complete Medication List: 1)  Allopurinol 300 Mg Tabs (Allopurinol) .... Take 1 tablet by mouth once a day 2)  Cialis 20 Mg Tabs (Tadalafil) .... Take 1 tablet by mouth once a day prn 3)  Prilosec Otc 20 Mg Tbec (Omeprazole magnesium) .... Take 1 tablet by mouth once a day 4)  Simvastatin 40 Mg Tabs (Simvastatin) .... Take 1/2 tablet by mouth once a day 5)  Micardis 80 Mg Tabs (Telmisartan) .[name].. 1po once daily 6)  Furosemide 40 Mg Tabs (Furosemide) .[name].. 1 by mouth two times a day 7)  Klor-con 10 10 Meq Tbcr (Potassium chloride) .... 3 by mouth once daily 8)  Bystolic 20 Mg Tabs (Nebivolol hcl) .[name].. 1 by mouth  once daily 9)  Adult Aspirin Ec Low Strength 81 Mg Tbec (Aspirin) .[name].. 1 by mouth once daily 10)  Mucinex Maximum Strength 1200 Mg Tb12 (Guaifenesin) .[name].. 1 by mouth two times a day as needed 11)  Gfn 550/pse 60 550-60 Mg Xr12h-tab (Pseudoephedrine-guaifenesin) .[name].. 1 by mouth two times a day 12)  Glimepiride 4 Mg Tabs (Glimepiride) .... Take 2 by mouth qam 13)  Metformin Hcl 500 Mg Xr24h-tab (Metformin hcl) .... 3 by mouth once daily 14)  Lancets Misc (Lancets) .... Use asd 1 once daily 15)  Actos 30 Mg Tabs (Pioglitazone hcl) .[name].. 1 by mouth once daily 16)  Diltiazem Hcl Cr 240 Mg Xr24h-cap (Diltiazem hcl) .[name].. 1po once daily 17)  Hydralazine Hcl 50 Mg Tabs (Hydralazine hcl) .[name].. 1 by mouth three times a day 18)  Alprazolam 0.5 Mg Tabs (Alprazolam) .... 1/2 - 1 by mouth once daily as needed 19)  Metronidazole 250 Mg Tabs (Metronidazole) .[name].. 1po four times per day  Other Orders: T-Lactoferrin Fecal,  qual [phone]) T-Stool Culture ([id number]) T-Clostridium difficile Toxin A/B ([id number]) TLB-BMP (Basic Metabolic Panel-BMET) (80048-METABOL) TLB-CBC Platelet - w/Differential (85025-CBCD) TLB-Hepatic/Liver Function Pnl (80076-HEPATIC)  Patient Instructions: 1)  decrease the simvastatin to HALF per day 2)  Hold on taking your diabetes pills if you are not eating well 3)  cont to monitor your sugars as you do 4)  Please go to the Lab in the basement for your blood and/or urine tests today, and stool tests 5)  You will be contacted about the referral(s) to: CT scan  6)  Please call for any worsening symptoms  Prescriptions: METRONIDAZOLE 250 MG TABS (METRONIDAZOLE) 1po four times per day  #40 x 0   Entered and Authorized by:   Corwin Levins MD   Signed by:   Corwin Levins MD on 02/14/2010   Method used:   Print then Give to Patient   RxID:   [id number]

## 2010-06-20 NOTE — Consult Note (Signed)
Summary: Consultation Report  Consultation Report   Imported By: Lanelle Bal 10/07/2007 12:54:39  _____________________________________________________________________  External Attachment:    Type:   Image     Comment:   External Document

## 2010-06-20 NOTE — Assessment & Plan Note (Signed)
Summary: ELEVATED BP/JK   Vital Signs:  Patient Profile:   60 Years Old Male Weight:      238 pounds Temp:     97.8 degrees F oral Pulse rate:   76 / minute BP sitting:   161 / 100  (right arm) Cuff size:   large  Pt. in pain?   no  Vitals Entered By: Maris Berger (October 23, 2007 3:51 PM)                  Chief Complaint:  B/P up/ scheduled for surgery and could not do it.  History of Present Illness: cofuld not have sebaceous cyst right flank due to elev BP today; wt about the same; BP elev however over the last 2 to 3 mo it seems with diast > 100; denies increased salt or ETOH use; activity level about the same    Updated Prior Medication List: ALLOPURINOL 300 MG TABS (ALLOPURINOL) Take 1 tablet by mouth once a day CIALIS 20 MG TABS (TADALAFIL) Take 1 tablet by mouth once a day prn HYDROCODONE-ACETAMINOPHEN 5-500 MG TABS (HYDROCODONE-ACETAMINOPHEN) Take 1 tablet by mouth four times a day PERMETHRIN 5 % CREA (PERMETHRIN)  PRILOSEC OTC 20 MG TBEC (OMEPRAZOLE MAGNESIUM) Take 1 tablet by mouth once a day SIMVASTATIN 40 MG TABS (SIMVASTATIN) Take 1 tablet by mouth once a day AZOR 10-40 MG  TABS (AMLODIPINE-OLMESARTAN) 1 by mouth once daily FUROSEMIDE 40 MG  TABS (FUROSEMIDE) 1 by mouth bid GLIMEPIRIDE 2 MG  TABS (GLIMEPIRIDE) 1 by mouth qd TRIAMCINOLONE ACETONIDE 0.5 %  CREA (TRIAMCINOLONE ACETONIDE) use asd  two times a day prn KLOR-CON 10 10 MEQ  TBCR (POTASSIUM CHLORIDE) 1 by mouth qd PROZAC 20 MG  CAPS (FLUOXETINE HCL) 1 by mouth qd ATENOLOL 50 MG  TABS (ATENOLOL) 1 by mouth once daily ADULT ASPIRIN EC LOW STRENGTH 81 MG  TBEC (ASPIRIN) 1 by mouth once daily MUCINEX MAXIMUM STRENGTH 1200 MG  TB12 (GUAIFENESIN) 1 by mouth two times a day as needed  Current Allergies (reviewed today): ! ACCUPRIL ! Judye Bos  Past Medical History:    Reviewed history from 06/25/2007 and no changes required:       Colonic polyps, hx of       Diabetes mellitus, type II  Hypertension       GERD       insomnia       Gout       E.D.       Hyperlipidemia       Anxiety       Allergic rhinitis       IBS       Depression       recurrent peptic stricture       hx of shingles       chronic mild elev LFT's  Past Surgical History:    Reviewed history from 06/25/2007 and no changes required:       Inguinal herniorrhaphy       s/p septoplasty   Social History:    Reviewed history from 06/25/2007 and no changes required:       Never Smoked       Alcohol use-no    Review of Systems       all otherwise negative    Physical Exam  General:     Well-developed,well-nourished,in no acute distress; alert,appropriate and cooperative throughout examination Head:     Normocephalic and atraumatic without obvious abnormalities. No apparent alopecia or balding.  Eyes:     No corneal or conjunctival inflammation noted. EOMI. Perrla. Ears:     External ear exam shows no significant lesions or deformities.  Otoscopic examination reveals clear canals, tympanic membranes are intact bilaterally without bulging, retraction, inflammation or discharge. Hearing is grossly normal bilaterally. Nose:     External nasal examination shows no deformity or inflammation. Nasal mucosa are pink and moist without lesions or exudates. Mouth:     Oral mucosa and oropharynx without lesions or exudates.  Teeth in good repair. Neck:     No deformities, masses, or tenderness noted. Lungs:     Normal respiratory effort, chest expands symmetrically. Lungs are clear to auscultation, no crackles or wheezes. Heart:     Normal rate and regular rhythm. S1 and S2 normal without gallop, murmur, click, rub or other extra sounds. Extremities:     no edema, no ulcers     Impression & Recommendations:  Problem # 1:  HYPERTENSION (ICD-401.9)  The following medications were removed from the medication list:    Amlodipine Besylate 10 Mg Tabs (Amlodipine besylate) .[name]... 1 by mouth qd  His  updated medication list for this problem includes:    Azor 10-40 Mg Tabs (Amlodipine-olmesartan) .[name]... 1 by mouth once daily    Furosemide 40 Mg Tabs (Furosemide) .[name]... 1 by mouth bid    Atenolol 50 Mg Tabs (Atenolol) .[name]... 1 by mouth once daily uncontrolled, meds adjusted; check BP further at home, avoid salt and ETOH; lose wt BP today: 161/100 Prior BP: 148/98 (08/01/2007)  Labs Reviewed: Creat: 1.1 (06/25/2007) Chol: 126 (06/25/2007)   HDL: 26.5 (06/25/2007)   LDL: 69 (06/25/2007)   TG: 155 (06/25/2007)   Problem # 2:  DIABETES MELLITUS, TYPE II (ICD-250.00)  His updated medication list for this problem includes:    Glimepiride 2 Mg Tabs (Glimepiride) .[name]... 1 by mouth qd    Adult Aspirin Ec Low Strength 81 Mg Tbec (Aspirin) .[name]... 1 by mouth once daily  Labs Reviewed: HgBA1c: 7.0 (06/25/2007)   Creat: 1.1 (06/25/2007)     stable overall by hx and exam, ok to continue meds/tx as is   Complete Medication List: 1)  Allopurinol 300 Mg Tabs (Allopurinol) .... Take 1 tablet by mouth once a day 2)  Cialis 20 Mg Tabs (Tadalafil) .... Take 1 tablet by mouth once a day prn 3)  Hydrocodone-acetaminophen 5-500 Mg Tabs (Hydrocodone-acetaminophen) .... Take 1 tablet by mouth four times a day 4)  Permethrin 5 % Crea (Permethrin) 5)  Prilosec Otc 20 Mg Tbec (Omeprazole magnesium) .... Take 1 tablet by mouth once a day 6)  Simvastatin 40 Mg Tabs (Simvastatin) .... Take 1 tablet by mouth once a day 7)  Azor 10-40 Mg Tabs (Amlodipine-olmesartan) .[name].. 1 by mouth once daily 8)  Furosemide 40 Mg Tabs (Furosemide) .[name].. 1 by mouth bid 9)  Glimepiride 2 Mg Tabs (Glimepiride) .[name].. 1 by mouth qd 10)  Triamcinolone Acetonide 0.5 % Crea (Triamcinolone acetonide) .... Use asd  two times a day prn 11)  Klor-con 10 10 Meq Tbcr (Potassium chloride) .[name].. 1 by mouth qd 12)  Prozac 20 Mg Caps (Fluoxetine hcl) .[name].. 1 by mouth qd 13)  Atenolol 50 Mg Tabs (Atenolol) .[name].. 1 by mouth once daily 14)  Adult Aspirin Ec  Low Strength 81 Mg Tbec (Aspirin) .[name].. 1 by mouth once daily 15)  Mucinex Maximum Strength 1200 Mg Tb12 (Guaifenesin) .[name].. 1 by mouth two times a day as needed   Patient Instructions: 1)  finish what you have of the amlodipine and the benicar 2)  after they are done, change to azor 10/40 mg - 1 per day 3)  at this time, start the atenolol 50 mg - 1 per day 4)  Please schedule a follow-up appointment as recommended before 5)  Check your Blood Pressure regularly. If it is above 140/90: you should make an appointment sooner 6)  Take an Aspirin every day - 81 mg - 1 per day - COATED only   Prescriptions: MUCINEX MAXIMUM STRENGTH 1200 MG  TB12 (GUAIFENESIN) 1 by mouth two times a day as needed  #60 x 11   Entered and Authorized by:   Corwin Levins MD   Signed by:   Corwin Levins MD on 10/23/2007   Method used:   Print then Give to Patient   RxID:   [id number] ATENOLOL 50 MG  TABS (ATENOLOL) 1 by mouth once daily  #90 x 3   Entered and Authorized by:   Corwin Levins MD   Signed by:   Corwin Levins MD on 10/23/2007   Method used:   Print then Give to Patient   RxID:   [id number] AZOR 10-40 MG  TABS (AMLODIPINE-OLMESARTAN) 1 by mouth once daily  #30 x 11   Entered and Authorized by:   Corwin Levins MD   Signed by:   Corwin Levins MD on 10/23/2007   Method used:   Print then Give to Patient   RxID:   [id number] CIALIS 20 MG TABS (TADALAFIL) Take 1 tablet by mouth once a day prn  #10 x 11   Entered and Authorized by:   Corwin Levins MD   Signed by:   Corwin Levins MD on 10/23/2007   Method used:   Print then Give to Patient   RxID:   [id number]  ]

## 2010-06-20 NOTE — Miscellaneous (Signed)
Summary: Orders Update  Clinical Lists Changes  Orders: Added new Referral order of Urology Referral (Urology) - Signed 

## 2010-06-20 NOTE — Assessment & Plan Note (Signed)
Summary: STILL HAVING STOMACH PROBLEMS/CRAMPS/ PROBLEM WITH MEDICINE /NWS   Vital Signs:  Patient profile:   60 year old male Height:      68 inches Weight:      228.50 pounds BMI:     34.87 O2 Sat:      95 % on Room air Temp:     99.4 degrees F oral Pulse rate:   77 / minute BP sitting:   138 / 78  (left arm) Cuff size:   large  Vitals Entered By: Zella Ball Ewing CMA Duncan Dull) (February 17, 2010 2:41 PM)  O2 Flow:  Room air CC: Stomach pain, coughing/RE CBG Result 156   CC:  Stomach pain and coughing/RE.  History of Present Illness: here to f/u - overall improved it seems;  never took the oxycodone as he couldnt remember if it was oxycodon or hydrocodone that caused confusion in the past so has "toughing out" the pain, but overall pain improved from the 8-10/10  - now 5-6/10, with no n/v;  has some less abd distension, fever much improved, no chils, no worsening radiation to the back;  overall loose stool improved as well.  Seems to tolerate the meds but has some mild speech slurring and unusual hoarsenesswith the cipro per pt.  Curretnly eating only jello and fluids, holding his DM meds, not checked his cbg's in the paat few days, but denies polydipsia, polyiuria.  Does have quite a bit of borborygmia, but not worsening pain overall, adn no blood seen.  Has dry mouth but still taking by mouth well.  Problems Prior to Update: 1)  Diverticulitis, Acute  (ICD-562.11) 2)  Diverticulitis, Acute  (ICD-562.11) 3)  Diarrhea of Presumed Infectious Origin  (ICD-009.3) 4)  Abdominal Pain, Unspecified Site  (ICD-789.00) 5)  Peripheral Edema  (ICD-782.3) 6)  Unspecified Disorder of Kidney and Ureter  (ICD-593.9) 7)  Plantar Fasciitis, Right  (ICD-728.71) 8)  Rash-nonvesicular  (ICD-782.1) 9)  Transaminases, Serum, Elevated  (ICD-790.4) 10)  Other Specified Forms of Hearing Loss  (ICD-389.8) 11)  Hypokalemia  (ICD-276.8) 12)  Plantar Wart  (ICD-078.12) 13)  Electrocardiogram, Abnormal   (ICD-794.31) 14)  Preventive Health Care  (ICD-V70.0) 15)  Sebaceous Cyst, Infected  (ICD-706.2) 16)  Depression  (ICD-311) 17)  Ibs  (ICD-564.1) 18)  Allergic Rhinitis  (ICD-477.9) 19)  Anxiety  (ICD-300.00) 20)  Hyperlipidemia  (ICD-272.4) 21)  Gout  (ICD-274.9) 22)  Gerd  (ICD-530.81) 23)  Rash-nonvesicular  (ICD-782.1) 24)  Hypertension  (ICD-401.9) 25)  Diabetes Mellitus, Type II  (ICD-250.00) 26)  Sinusitis- Acute-nos  (ICD-461.9) 27)  Colonic Polyps, Hx of  (ICD-V12.72)  Medications Prior to Update: 1)  Allopurinol 300 Mg Tabs (Allopurinol) .... Take 1 Tablet By Mouth Once A Day 2)  Cialis 20 Mg Tabs (Tadalafil) .... Take 1 Tablet By Mouth Once A Day Prn 3)  Prilosec Otc 20 Mg Tbec (Omeprazole Magnesium) .... Take 1 Tablet By Mouth Once A Day 4)  Simvastatin 40 Mg Tabs (Simvastatin) .... Take 1/2 Tablet By Mouth Once A Day 5)  Micardis 80 Mg Tabs (Telmisartan) .[name].. 1po Once Daily 6)  Furosemide 40 Mg  Tabs (Furosemide) .[name].. 1 By Mouth Two Times A Day 7)  Klor-Con 10 10 Meq  Tbcr (Potassium Chloride) .... 3 By Mouth Once Daily 8)  Bystolic 20 Mg Tabs (Nebivolol Hcl) .[name].. 1 By Mouth  Once Daily 9)  Adult Aspirin Ec Low Strength 81 Mg  Tbec (Aspirin) .[name].. 1 By Mouth Once Daily 10)  Mucinex Maximum  Strength 1200 Mg  Tb12 (Guaifenesin) .[name].. 1 By Mouth Two Times A Day As Needed 11)  Gfn 550/pse 60 550-60 Mg Xr12h-Tab (Pseudoephedrine-Guaifenesin) .[name].. 1 By Mouth Two Times A Day 12)  Glimepiride 4 Mg Tabs (Glimepiride) .... Take 2 By Mouth Qam 13)  Metformin Hcl 500 Mg Xr24h-Tab (Metformin Hcl) .... 3 By Mouth Once Daily 14)  Lancets  Misc (Lancets) .... Use Asd 1 Once Daily 15)  Actos 30 Mg Tabs (Pioglitazone Hcl) .[name].. 1 By Mouth Once Daily 16)  Diltiazem Hcl Cr 240 Mg Xr24h-Cap (Diltiazem Hcl) .[name].. 1po Once Daily 17)  Hydralazine Hcl 50 Mg Tabs (Hydralazine Hcl) .[name].. 1 By Mouth Three Times A Day 18)  Alprazolam 0.5 Mg Tabs (Alprazolam) .... 1/2 - 1 By Mouth Once Daily As  Needed 19)  Metronidazole 250 Mg Tabs (Metronidazole) .[name].. 1po Four Times Per Day 20)  Cipro 500 Mg Tabs (Ciprofloxacin Hcl) .[name].. 1 By Mouth Two Times A Day 21)  Oxycodone Hcl 5 Mg Tabs (Oxycodone Hcl) .[name].. 1 -2 By Mouth Q 6 Hrs As Needed Pain  Current Medications (verified): 1)  Allopurinol 300 Mg Tabs (Allopurinol) .... Take 1 Tablet By Mouth Once A Day 2)  Cialis 20 Mg Tabs (Tadalafil) .... Take 1 Tablet By Mouth Once A Day Prn 3)  Prilosec Otc 20 Mg Tbec (Omeprazole Magnesium) .... Take 1 Tablet By Mouth Once A Day 4)  Simvastatin 40 Mg Tabs (Simvastatin) .... Take 1/2 Tablet By Mouth Once A Day 5)  Micardis 80 Mg Tabs (Telmisartan) .[name].. 1po Once Daily 6)  Furosemide 40 Mg  Tabs (Furosemide) .[name].. 1 By Mouth Two Times A Day 7)  Klor-Con 10 10 Meq  Tbcr (Potassium Chloride) .... 3 By Mouth Once Daily 8)  Bystolic 20 Mg Tabs (Nebivolol Hcl) .[name].. 1 By Mouth  Once Daily 9)  Adult Aspirin Ec Low Strength 81 Mg  Tbec (Aspirin) .[name].. 1 By Mouth Once Daily 10)  Mucinex Maximum Strength 1200 Mg  Tb12 (Guaifenesin) .[name].. 1 By Mouth Two Times A Day As Needed 11)  Gfn 550/pse 60 550-60 Mg Xr12h-Tab (Pseudoephedrine-Guaifenesin) .[name].. 1 By Mouth Two Times A Day 12)  Glimepiride 4 Mg Tabs (Glimepiride) .... Take 2 By Mouth Qam 13)  Metformin Hcl 500 Mg Xr24h-Tab (Metformin Hcl) .... 3 By Mouth Once Daily 14)  Lancets  Misc (Lancets) .... Use Asd 1 Once Daily 15)  Actos 30 Mg Tabs (Pioglitazone Hcl) .[name].. 1 By Mouth Once Daily 16)  Diltiazem Hcl Cr 240 Mg Xr24h-Cap (Diltiazem Hcl) .[name].. 1po Once Daily 17)  Hydralazine Hcl 50 Mg Tabs (Hydralazine Hcl) .[name].. 1 By Mouth Three Times A Day 18)  Alprazolam 0.5 Mg Tabs (Alprazolam) .... 1/2 - 1 By Mouth Once Daily As Needed 19)  Metronidazole 250 Mg Tabs (Metronidazole) .[name].. 1po Four Times Per Day 20)  Cipro 500 Mg Tabs (Ciprofloxacin Hcl) .[name].. 1 By Mouth Two Times A Day 21)  Tramadol Hcl 50 Mg Tabs (Tramadol Hcl) .[name].. 1 - 2 By Mouth Q 6 Hrs As Needed  Pain  Allergies (verified): 1)  ! Accupril 2)  ! * Lexapro 3)  * Glimeparide  Past History:  Past Medical History: Last updated: 06/25/2007 Colonic polyps, hx of Diabetes mellitus, type II Hypertension GERD insomnia Gout E.D. 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  , mild to mod ill appearing, still weak but improved Head:  normocephalic and atraumatic.   Eyes:  vision grossly intact, pupils equal, and pupils round.   Ears:  R ear normal and L ear normal.   Nose:  no external deformity and no nasal discharge.   Mouth:  no gingival abnormalities and pharynx pink and moist.   Neck:  supple and no masses.   Lungs:  normal respiratory effort and normal breath sounds.   Heart:  normal rate and regular rhythm.   Abdomen:  soft and normal bowel sounds.  with still present but mild to mod tender to LLQ, mid and RLQ as well, no guarding or rebound Msk:  no joint tenderness and no joint swelling.   Extremities:  no edema, no erythema  Neurologic:  no confused, motor gross normal   Impression & Recommendations:  Problem # 1:  DIVERTICULITIS, ACUTE (ICD-562.11) overall improved, prob on the dry side but taking by mouth ok; to cont meds as is; push fluids, rest, tylenol as needed , advance diet as tolerated  Problem # 2:  DIABETES MELLITUS, TYPE II (ICD-250.00)  His updated medication list for this problem includes:    Micardis 80 Mg Tabs (Telmisartan) .[name]... 1po once daily    Adult Aspirin  Ec Low Strength 81 Mg Tbec (Aspirin) .[name]... 1 by mouth once daily    Glimepiride 4 Mg Tabs (Glimepiride) .[name]... Take 2 by mouth qam    Metformin Hcl 500 Mg Xr24h-tab (Metformin hcl) .Marland KitchenMarland KitchenMarland KitchenMarland Kitchen 3 by mouth once daily    Actos 30 Mg Tabs (Pioglitazone hcl) .[name]... 1 by mouth once daily to cont to hold meds until starting more usual diet;  cbg 156 in the office today  Orders: Glucose, (CBG) [phone])  Labs Reviewed: Creat: 1.6 (02/14/2010)    Reviewed HgBA1c results: 6.0 (09/29/2009)  9.8 (03/28/2009)  Problem # 3:  HYPERTENSION (ICD-401.9)  His updated medication list for this problem includes:    Micardis 80 Mg Tabs (Telmisartan) .[name]... 1po once daily    Furosemide 40 Mg Tabs (Furosemide) .[name]... 1 by mouth two times a day    Bystolic 20 Mg Tabs (Nebivolol hcl) .[name]... 1 by mouth  once daily    Diltiazem Hcl Cr 240 Mg Xr24h-cap (Diltiazem hcl) .[name]... 1po once daily    Hydralazine Hcl 50 Mg Tabs (Hydralazine hcl) .[name]... 1 by mouth three times a day  BP today: 138/78 Prior BP: 140/78 (02/14/2010)  Labs Reviewed: K+: 3.5 (02/14/2010) Creat: : 1.6 (02/14/2010)   Chol: 129 (03/28/2009)   HDL: 30.60 (03/28/2009)   LDL: DEL (03/18/2008)   TG: 254.0 (03/28/2009) stable overall by hx and exam, ok to continue meds/tx as is   Complete Medication List: 1)  Allopurinol 300 Mg Tabs (Allopurinol) .... Take 1 tablet by mouth once a day 2)  Cialis 20 Mg Tabs (Tadalafil) .... Take 1 tablet by mouth once a day prn 3)  Prilosec Otc 20 Mg Tbec (Omeprazole magnesium) .... Take 1 tablet by mouth once a day 4)  Simvastatin 40 Mg Tabs (Simvastatin) .... Take 1/2 tablet by mouth once a day 5)  Micardis 80 Mg Tabs (Telmisartan) .[name].. 1po once daily 6)  Furosemide 40 Mg Tabs (Furosemide) .[name].. 1 by mouth two  times a day 7)  Klor-con 10 10 Meq Tbcr (Potassium chloride) .... 3 by mouth once daily 8)  Bystolic 20 Mg Tabs (Nebivolol hcl) .[name].. 1 by mouth  once daily 9)  Adult Aspirin Ec Low Strength 81 Mg Tbec (Aspirin) .[name].. 1 by  mouth once daily 10)  Mucinex Maximum Strength 1200 Mg Tb12 (Guaifenesin) .[name].. 1 by mouth two times a day as needed 11)  Gfn 550/pse 60 550-60 Mg Xr12h-tab (Pseudoephedrine-guaifenesin) .[name].. 1 by mouth two times a day 12)  Glimepiride 4 Mg Tabs (Glimepiride) .... Take 2 by mouth qam 13)  Metformin Hcl 500 Mg Xr24h-tab (Metformin hcl) .... 3 by mouth once daily 14)  Lancets Misc (Lancets) .... Use asd 1 once daily 15)  Actos 30 Mg Tabs (Pioglitazone hcl) .[name].. 1 by mouth once daily 16)  Diltiazem Hcl Cr 240 Mg Xr24h-cap (Diltiazem hcl) .[name].. 1po once daily 17)  Hydralazine Hcl 50 Mg Tabs (Hydralazine hcl) .[name].. 1 by mouth three times a day 18)  Alprazolam 0.5 Mg Tabs (Alprazolam) .... 1/2 - 1 by mouth once daily as needed 19)  Metronidazole 250 Mg Tabs (Metronidazole) .[name].. 1po four times per day 20)  Cipro 500 Mg Tabs (Ciprofloxacin hcl) .[name].. 1 by mouth two times a day 21)  Tramadol Hcl 50 Mg Tabs (Tramadol hcl) .[name].. 1 - 2 by mouth q 6 hrs as needed pain  Patient Instructions: 1)  Your blood sugar was 156 today 2)  Continue all previous medications as before this visit  3)  Drink plenty of fluids in the next few days 4)  You can take tylenol as needed for pain 5)  Please take all new medications as prescribed - the tramadol as needed for pain as well 6)  You can also use Immodium or it's generic for diarrhea 7)  Get plenty of rest 8)  You can advance your diet as tolerated to the diabetic diet, and re-start the medications when eating more the usual diet 9)  Please contact your pharmacy to find out for the future if you had trouble before with hydrocodone vs oxycodone 10)  Please schedule a follow-up appointment in 2 months with CPX labs and: 11)  HbgA1C prior to visit, ICD-9: 250.02 12)  Urine Microalbumin prior to visit, ICD-9: Prescriptions: TRAMADOL HCL 50 MG TABS (TRAMADOL HCL) 1 - 2 by mouth q 6 hrs as needed pain  #60 x 1   Entered and Authorized by:   Corwin Levins MD   Signed by:    Corwin Levins MD on 02/17/2010   Method used:   Print then Give to Patient   RxID:   [id number]   Laboratory Results   Blood Tests     CBG Random:: 156mg /dL

## 2010-06-20 NOTE — Letter (Signed)
Summary: [facility]   Imported By: Sherian Rein 12/12/2009 11:29:27  _____________________________________________________________________  External Attachment:    Type:   Image     Comment:   External Document

## 2010-06-20 NOTE — Assessment & Plan Note (Signed)
Summary: elev bp,colonscopy at [facility] this am/will be drove,ok dr Alvino Chapel...   Vital Signs:  Patient profile:   60 year old male Height:      68 inches Weight:      221.75 pounds BMI:     33.84 Temp:     98.0 degrees F oral Pulse rate:   57 / minute Pulse rhythm:   regular Resp:     14 per minute BP sitting:   170 / 100  (left arm) Cuff size:   regular  Vitals Entered By: Mervin Kung CMA (Sep 29, 2009 4:19 PM) CC: room B3  Pt bp elevated since this a.m.  Had colonoscopy. BP still elevated, has headache and tingling in head. Is Patient Diabetic? Yes   CC:  room B3  Pt bp elevated since this a.m.  Had colonoscopy. BP still elevated and has headache and tingling in head.[name]  History of Present Illness: here after onset HA last PM with doing the prep last night for colonoscopy with Dr Claria Dice this AM - pt felt somewhat improved with shower this AM;  I spoke to Dr Randa Evens earlier today who noted BP 202/140 prior to procedure with HA;  improved with sedation;  did take meds approx 90 min prior to procedure;  tolerated procedure o/w well; BP 190/90 after and pt without HA or other symptoms so allowed to return home, but also referred here later today for f/u;  at home pt states BP  was 238/128, had some palpitations with lying down;  came here for 3 PM appt, no further HA, and denies confusion, ST, cough, and Pt denies CP, sob, doe, wheezing, orthopnea, pnd, worsening LE edema,  dizziness or syncope,  No abd pain, flank pain, hematuria, recent fever, wt loss, night sweats or other constitutional symtpoms.  Overall good med complaicne and good tolerability. No change in diet such as increased salt, wt overall stable.   Pt denies polydipsia, polyuria, or low sugar symptoms such as shakiness improved with eating.  Overall good compliance with meds, trying to follow low chol, DM diet, wt stable, little excercise however   also incidently with c/o right plantar heel tenderness, worse with first step  in the am, and later in the day after sitting for approx 30 min.  Has done a bit more walking and standing recently.    Problems Prior to Update: 1)  Rash-nonvesicular  (ICD-782.1) 2)  Transaminases, Serum, Elevated  (ICD-790.4) 3)  Other Specified Forms of Hearing Loss  (ICD-389.8) 4)  Hypokalemia  (ICD-276.8) 5)  Plantar Wart  (ICD-078.12) 6)  Electrocardiogram, Abnormal  (ICD-794.31) 7)  Preventive Health Care  (ICD-V70.0) 8)  Sebaceous Cyst, Infected  (ICD-706.2) 9)  Depression  (ICD-311) 10)  Ibs  (ICD-564.1) 11)  Allergic Rhinitis  (ICD-477.9) 12)  Anxiety  (ICD-300.00) 13)  Hyperlipidemia  (ICD-272.4) 14)  Gout  (ICD-274.9) 15)  Gerd  (ICD-530.81) 16)  Rash-nonvesicular  (ICD-782.1) 17)  Hypertension  (ICD-401.9) 18)  Diabetes Mellitus, Type II  (ICD-250.00) 19)  Sinusitis- Acute-nos  (ICD-461.9) 20)  Colonic Polyps, Hx of  (ICD-V12.72)  Medications Prior to Update: 1)  Allopurinol 300 Mg Tabs (Allopurinol) .... Take 1 Tablet By Mouth Once A Day 2)  Cialis 20 Mg Tabs (Tadalafil) .... Take 1 Tablet By Mouth Once A Day Prn 3)  Prilosec Otc 20 Mg Tbec (Omeprazole Magnesium) .... Take 1 Tablet By Mouth Once A Day 4)  Simvastatin 40 Mg Tabs (Simvastatin) .... Take 1 Tablet By Mouth  Once A Day 5)  Micardis 80 Mg Tabs (Telmisartan) .[name].. 1 By Mouth Once Daily 6)  Furosemide 40 Mg  Tabs (Furosemide) .[name].. 1 By Mouth Once Daily 7)  Lotrisone 1-0.05 % Crea (Clotrimazole-Betamethasone) .... Use Asd Two Times A Day As Needed 8)  Klor-Con 10 10 Meq  Tbcr (Potassium Chloride) .... 3 By Mouth Once Daily 9)  Bystolic 20 Mg Tabs (Nebivolol Hcl) .[name].. 1 By Mouth  Once Daily 10)  Adult Aspirin Ec Low Strength 81 Mg  Tbec (Aspirin) .[name].. 1 By Mouth Once Daily 11)  Mucinex Maximum Strength 1200 Mg  Tb12 (Guaifenesin) .[name].. 1 By Mouth Two Times A Day As Needed 12)  Gfn 550/pse 60 550-60 Mg Xr12h-Tab (Pseudoephedrine-Guaifenesin) .[name].. 1 By Mouth Two Times A Day 13)  Glimepiride 4 Mg Tabs  (Glimepiride) .... Take 2 By Mouth Qam 14)  Metformin Hcl 500 Mg Xr24h-Tab (Metformin Hcl) .... 3 By Mouth Once Daily 15)  Accu-Chek Aviva  Strp (Glucose Blood) .... Use Asd Two Times A Day 16)  Lancets  Misc (Lancets) .... Use Asd 1 Once Daily 17)  Actos 45 Mg Tabs (Pioglitazone Hcl) .[name].. 1po Once Daily 18)  Diltiazem Hcl Cr 240 Mg Xr24h-Cap (Diltiazem Hcl) .[name].. 1po Once Daily  Current Medications (verified): 1)  Allopurinol 300 Mg Tabs (Allopurinol) .... Take 1 Tablet By Mouth Once A Day 2)  Cialis 20 Mg Tabs (Tadalafil) .... Take 1 Tablet By Mouth Once A Day Prn 3)  Prilosec Otc 20 Mg Tbec (Omeprazole Magnesium) .... Take 1 Tablet By Mouth Once A Day 4)  Simvastatin 40 Mg Tabs (Simvastatin) .... Take 1 Tablet By Mouth Once A Day 5)  Micardis 80 Mg Tabs (Telmisartan) .[name].. 1 By Mouth Once Daily 6)  Furosemide 40 Mg  Tabs (Furosemide) .[name].. 1 By Mouth Once Daily 7)  Lotrisone 1-0.05 % Crea (Clotrimazole-Betamethasone) .... Use Asd Two Times A Day As Needed 8)  Klor-Con 10 10 Meq  Tbcr (Potassium Chloride) .... 3 By Mouth Once Daily 9)  Bystolic 20 Mg Tabs (Nebivolol Hcl) .[name].. 1 By Mouth  Once Daily 10)  Adult Aspirin Ec Low Strength 81 Mg  Tbec (Aspirin) .[name].. 1 By Mouth Once Daily 11)  Mucinex Maximum Strength 1200 Mg  Tb12 (Guaifenesin) .[name].. 1 By Mouth Two Times A Day As Needed 12)  Gfn 550/pse 60 550-60 Mg Xr12h-Tab (Pseudoephedrine-Guaifenesin) .[name].. 1 By Mouth Two Times A Day 13)  Glimepiride 4 Mg Tabs (Glimepiride) .... Take 2 By Mouth Qam 14)  Metformin Hcl 500 Mg Xr24h-Tab (Metformin Hcl) .... 3 By Mouth Once Daily 15)  Accu-Chek Aviva  Strp (Glucose Blood) .... Use Asd Two Times A Day 16)  Lancets  Misc (Lancets) .... Use Asd 1 Once Daily 17)  Actos 45 Mg Tabs (Pioglitazone Hcl) .[name].. 1po Once Daily 18)  Diltiazem Hcl Cr 240 Mg Xr24h-Cap (Diltiazem Hcl) .[name].. 1po Once Daily  Allergies: 1)  ! Accupril 2)  ! * Lexapro 3)  * Glimeparide  Past History:  Past Medical History: Last  updated: 06/25/2007 Colonic polyps, hx of Diabetes mellitus, type II Hypertension GERD insomnia Gout E.D. 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   Head:  normocephalic and atraumatic.   Eyes:  vision grossly intact, pupils equal, and pupils round.   Ears:  R ear normal and L ear normal.   Nose:  no external deformity and no nasal discharge.   Mouth:  no gingival abnormalities and pharynx pink and moist.   Neck:  supple and no masses.   Lungs:  normal respiratory effort and normal breath sounds.   Heart:  normal rate and regular rhythm.   Abdomen:  soft, non-tender, and normal bowel sounds.   Msk:  no joint tenderness and no joint swelling.   Extremities:  trace to 1+ ankle edema LLE only, none on the right, no erythema, ulcers Neurologic:  alert & oriented X3, cranial nerves II-XII intact, strength normal in all extremities, and gait normal.     Impression & Recommendations:  Problem # 1:  HYPERTENSION (ICD-401.9)  His updated medication list for this problem includes:    Micardis 80 Mg Tabs (Telmisartan) .[name]... 1 by mouth once daily    Furosemide 40 Mg Tabs (Furosemide) .[name]... 1 by mouth once daily    Bystolic 20 Mg Tabs (Nebivolol hcl) .[name]... 1 by mouth  once daily    Diltiazem Hcl Cr 240 Mg Xr24h-cap (Diltiazem hcl) .[name]... 1po once daily  Orders: T-Urine 24 Hr. Catecholamines [phone]) T-Urine 24 Hr. Metanephrines [id number]) EKG w/ Interpretation (93000) TLB-BMP (Basic  Metabolic Panel-BMET) (80048-METABOL) TLB-CBC Platelet - w/Differential (85025-CBCD) moderate elevated, currently asympt, ecg reviewed; ok to take extra lasix dose tonight, likely to cont to improve on current meds ; ok to cont meds as is and f/u closely at home as he does.  Will also check urinary catechols,meta due to HA, HTN and palps., as well as labs above, o/w f/u next visit  BP today: 170/100 Prior BP: 152/90 (07/06/2009)  Labs Reviewed: K+: 3.1 (03/28/2009) Creat: : 0.9 (03/28/2009)   Chol: 129 (03/28/2009)   HDL: 30.60 (03/28/2009)   LDL: DEL (03/18/2008)   TG: 254.0 (03/28/2009)  Problem # 2:  PLANTAR FASCIITIS, RIGHT (ICD-728.71) for tylenol as needed, soft soled shoes or shoe inserts, consider podiatry referral (declines at this time) - mild  Problem # 3:  DIABETES MELLITUS, TYPE II (ICD-250.00)  His updated medication list for this problem includes:    Micardis 80 Mg Tabs (Telmisartan) .[name]... 1 by mouth once daily    Adult Aspirin Ec Low Strength 81 Mg Tbec (Aspirin) .[name]... 1 by mouth once daily    Glimepiride 4 Mg Tabs (Glimepiride) .[name]... Take 2 by mouth qam    Metformin Hcl 500 Mg Xr24h-tab (Metformin hcl) .Marland KitchenMarland KitchenMarland KitchenMarland Kitchen 3 by mouth once daily    Actos 45 Mg Tabs (Pioglitazone hcl) .[name]... 1po once daily  Orders: TLB-A1C / Hgb A1C (Glycohemoglobin) (83036-A1C)  Labs Reviewed: Creat: 0.9 (03/28/2009)    Reviewed HgBA1c results: 9.8 (03/28/2009)  8.1 (10/11/2008) stable overall by hx and exam, ok to continue meds/tx as is   Complete Medication List: 1)  Allopurinol 300 Mg Tabs (Allopurinol) .... Take 1 tablet by mouth once a day 2)  Cialis 20 Mg Tabs (Tadalafil) .... Take 1 tablet by mouth once a day prn 3)  Prilosec Otc 20 Mg Tbec (Omeprazole magnesium) .... Take 1 tablet by mouth once a day 4)  Simvastatin 40 Mg Tabs (Simvastatin) .... Take 1 tablet by mouth once a day 5)  Micardis 80 Mg Tabs (Telmisartan) .[name].. 1 by  mouth once daily 6)  Furosemide 40 Mg Tabs (Furosemide) .[name].. 1  by mouth once daily 7)  Lotrisone 1-0.05 % Crea (Clotrimazole-betamethasone) .... Use asd two times a day as needed 8)  Klor-con 10 10 Meq Tbcr (Potassium chloride) .... 3 by mouth once daily 9)  Bystolic 20 Mg Tabs (Nebivolol hcl) .[name].. 1 by mouth  once daily 10)  Adult Aspirin Ec Low Strength 81 Mg Tbec (Aspirin) .[name].. 1 by mouth once daily 11)  Mucinex Maximum Strength 1200 Mg Tb12 (Guaifenesin) .[name].. 1 by mouth two times a day as needed 12)  Gfn 550/pse 60 550-60 Mg Xr12h-tab (Pseudoephedrine-guaifenesin) .[name].. 1 by mouth two times a day 13)  Glimepiride 4 Mg Tabs (Glimepiride) .... Take 2 by mouth qam 14)  Metformin Hcl 500 Mg Xr24h-tab (Metformin hcl) .... 3 by mouth once daily 15)  Accu-chek Aviva Strp (Glucose blood) .... Use asd two times a day 16)  Lancets Misc (Lancets) .... Use asd 1 once daily 17)  Actos 45 Mg Tabs (Pioglitazone hcl) .[name].. 1po once daily 18)  Diltiazem Hcl Cr 240 Mg Xr24h-cap (Diltiazem hcl) .[name].. 1po once daily   Patient Instructions: 1)  please take furosemide 40 mg by mouth x 1 extra tonight 2)  Continue all previous medications as before this visit  3)  Please go to the Lab in the basement for your blood and/or urine tests today 4)  Please check your BP's regularly, once later tonight, and in the AM 5)  Please call if the BP remaines > 160/90 6)  Please schedule a follow-up appointment Nov 2011 with CPX labs  Current Allergies (reviewed today): ! ACCUPRIL ! * LEXAPRO * GLIMEPARIDE

## 2010-06-20 NOTE — Progress Notes (Signed)
Summary: Records Request   Faxed Stress to [facility] at [facility] ([phone]).  Debby Freiberg  April 27, 2010 10:35 AM

## 2010-06-20 NOTE — Progress Notes (Signed)
Summary: elevated BP  Phone Note Call from Patient Call back at Home Phone [phone]   Caller: Patient Call For: Dr Jonny Ruiz Summary of Call: Pt called and states his blood pressure is 212/128 and this was taken a few minutes ago, pt was told if bp was high this am to call. Initial call taken by: Verdell Face,  Sep 30, 2009 10:44 AM  Follow-up for Phone Call        will need to add hydralazine 25 three times a day - done escript;  cont all other meds, f/u OV mon next wk;  to ER if any symptoms we had discussed occur or if BP cont's to elevate more Follow-up by: Corwin Levins MD,  Sep 30, 2009 12:30 PM  Additional Follow-up for Phone Call Additional follow up Details #1::        Notified pt with md recommendations. Also made appt for mon 10/03/09 @ 10:00 Additional Follow-up by: Orlan Leavens,  Sep 30, 2009 12:34 PM    New/Updated Medications: HYDRALAZINE HCL 25 MG TABS (HYDRALAZINE HCL) 1 by mouth three times a day Prescriptions: HYDRALAZINE HCL 25 MG TABS (HYDRALAZINE HCL) 1 by mouth three times a day  #90 x 2   Entered and Authorized by:   Corwin Levins MD   Signed by:   Corwin Levins MD on 09/30/2009   Method used:   Electronically to        [facility]. [phone]* (retail)       [address]       [place], [place]  [address]       Ph: [phone]       Fax: [phone]   RxID:   [id number]

## 2010-06-20 NOTE — Consult Note (Signed)
Summary: [facility]   Imported By: Lennie Odor 03/21/2010 11:18:27  _____________________________________________________________________  External Attachment:    Type:   Image     Comment:   External Document

## 2010-06-20 NOTE — Assessment & Plan Note (Signed)
Summary: CPX / $50 /[facility]   Vital Signs:  Patient profile:   60 year old male Height:      67 inches Weight:      225 pounds BMI:     35.37 O2 Sat:      97 % on Room air Temp:     97.9 degrees F oral Pulse rate:   57 / minute BP sitting:   138 / 92  (left arm) Cuff size:   large  Vitals Entered ByMarland Kitchen Zella Ball Ewing (March 30, 2009 1:18 PM)  O2 Flow:  Room air  CC: adult physical/RE   CC:  adult physical/RE.  History of Present Illness: stopped his twysta due to swelling in the legs which is now improved off of med for 5 days;  lost 3 lbs from sept 2010; now walking about 3 days per wk;  Pt denies CP, sob, doe, wheezing, orthopnea, pnd, worsening LE edema, palps, dizziness or syncope  Pt denies new neuro symptoms such as headache, facial or extremity weakness Pt denies polydipsia, polyuria, or low sugar symptoms such as shakiness improved with eating.  Overall good compliance with meds, trying to follow low chol, DM diet, wt stable, little excercise however .    Problems Prior to Update: 1)  Other Specified Forms of Hearing Loss  (ICD-389.8) 2)  Hypokalemia  (ICD-276.8) 3)  Plantar Wart  (ICD-078.12) 4)  Electrocardiogram, Abnormal  (ICD-794.31) 5)  Preventive Health Care  (ICD-V70.0) 6)  Sebaceous Cyst, Infected  (ICD-706.2) 7)  Depression  (ICD-311) 8)  Ibs  (ICD-564.1) 9)  Allergic Rhinitis  (ICD-477.9) 10)  Anxiety  (ICD-300.00) 11)  Hyperlipidemia  (ICD-272.4) 12)  Gout  (ICD-274.9) 13)  Gerd  (ICD-530.81) 14)  Rash-nonvesicular  (ICD-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 Take 1 Tablet By Mouth Once A Day 2)  Cialis 20 Mg Tabs (Tadalafil) .... Take 1 Tablet By Mouth Once A Day Prn 3)  Prilosec Otc 20 Mg Tbec (Omeprazole Magnesium) .... Take 1 Tablet By Mouth Once A  Day 4)  Simvastatin 40 Mg Tabs (Simvastatin) .... Take 1 Tablet By Mouth Once A Day 5)  Twynsta 80-10 Mg Tabs (Telmisartan-Amlodipine) .[name].. 1 Po Once Daily 6)  Furosemide 40 Mg  Tabs (Furosemide) .[name].. 1 By Mouth Once Daily 7)  Triamcinolone Acetonide 0.5 %  Crea (Triamcinolone Acetonide) .... Use Asd  Two Times A Day Prn 8)  Klor-Con 10 10 Meq  Tbcr (Potassium Chloride) .... 3 By Mouth Once Daily 9)  Bystolic 20 Mg Tabs (Nebivolol Hcl) .[name].. 1 By Mouth  Once Daily 10)  Adult Aspirin Ec Low Strength 81 Mg  Tbec (Aspirin) .[name].. 1 By Mouth Once Daily 11)  Mucinex Maximum Strength 1200 Mg  Tb12 (Guaifenesin) .[name].. 1 By Mouth Two Times A Day As Needed 12)  Gfn 550/pse 60 550-60 Mg Xr12h-Tab (Pseudoephedrine-Guaifenesin) .[name].. 1 By Mouth Two Times A Day 13)  Glimepiride 4 Mg Tabs (Glimepiride) .... Take 1 By Mouth Two Times A Day 14)  Metformin Hcl 500 Mg Xr24h-Tab (Metformin Hcl) .... 3 By Mouth Once Daily 15)  Freestyle Lite Test  Strp (Glucose Blood) .... Use Asd 1 Once Daily 16)  Lancets  Misc (Lancets) .... Use Asd 1 Once Daily  Current Medications (verified): 1)  Allopurinol 300 Mg Tabs (Allopurinol) .Marland KitchenMarland KitchenMarland Kitchen  Take 1 Tablet By Mouth Once A Day 2)  Cialis 20 Mg Tabs (Tadalafil) .... Take 1 Tablet By Mouth Once A Day Prn 3)  Prilosec Otc 20 Mg Tbec (Omeprazole Magnesium) .... Take 1 Tablet By Mouth Once A Day 4)  Simvastatin 40 Mg Tabs (Simvastatin) .... Take 1 Tablet By Mouth Once A Day 5)  Micardis 80 Mg Tabs (Telmisartan) .[name].. 1 By Mouth Once Daily 6)  Furosemide 40 Mg  Tabs (Furosemide) .[name].. 1 By Mouth Once Daily 7)  Triamcinolone Acetonide 0.5 %  Crea (Triamcinolone Acetonide) .... Use Asd  Two Times A Day Prn 8)  Klor-Con 10 10 Meq  Tbcr (Potassium Chloride) .... 3 By Mouth Once Daily 9)  Bystolic 20 Mg Tabs (Nebivolol Hcl) .[name].. 1 By Mouth  Once Daily 10)  Adult Aspirin Ec Low Strength 81 Mg  Tbec (Aspirin) .[name].. 1 By Mouth Once Daily 11)  Mucinex Maximum Strength 1200 Mg  Tb12 (Guaifenesin) .[name]..  1 By Mouth Two Times A Day As Needed 12)  Gfn 550/pse 60 550-60 Mg Xr12h-Tab (Pseudoephedrine-Guaifenesin) .[name].. 1 By Mouth Two Times A Day 13)  Glimepiride 4 Mg Tabs (Glimepiride) .... Take 1 By Mouth Two Times A Day 14)  Metformin Hcl 500 Mg Xr24h-Tab (Metformin Hcl) .... 3 By Mouth Once Daily 15)  Freestyle Lite Test  Strp (Glucose Blood) .... Use Asd 1 Once Daily 16)  Lancets  Misc (Lancets) .... Use Asd 1 Once Daily 17)  Actos 45 Mg Tabs (Pioglitazone Hcl) .[name].. 1po Once Daily  Allergies (verified): 1)  ! Accupril 2)  ! * Lexapro 3)  * Glimeparide  Past History:  Past Medical History: Last updated: 06/25/2007 Colonic polyps, hx of Diabetes mellitus, type II Hypertension GERD insomnia Gout E.D. 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         all otherwise negative per pt   Physical  Exam  General:  alert and overweight-appearing.   Head:  normocephalic and atraumatic.   Eyes:  vision grossly intact, pupils equal, and pupils round.   Ears:  R ear normal and L ear normal.   Nose:  no external deformity and no nasal discharge.   Mouth:  no gingival abnormalities and pharynx pink and moist.   Neck:  supple and no masses.   Lungs:  normal respiratory effort and normal breath sounds.   Heart:  normal rate and regular rhythm.   Abdomen:  soft, non-tender, and normal bowel sounds.   Msk:  no joint tenderness and no joint swelling.   Extremities:  no edema, no erythema  Neurologic:  cranial nerves II-XII intact and strength normal in  all extremities.   Skin:  no rashes and no edema.     Impression & Recommendations:  Problem # 1:  Preventive Health Care (ICD-V70.0)  Overall doing well, up to date, counseled on routine health concerns for screening and prevention, immunizations up to date or declined, labs reviewed, ecg reviewed   Orders: EKG w/ Interpretation (93000)  Problem # 2:  TRANSAMINASES, SERUM, ELEVATED (ICD-790.4) Assessment: Unchanged acute hep panel pending  Problem # 3:  HYPOKALEMIA (ICD-276.8) K supp incresaed  Problem # 4:  HYPERTENSION (ICD-401.9)  His updated medication list for this problem includes:    Micardis 80 Mg Tabs (Telmisartan) .[name]... 1 by mouth once daily    Furosemide 40 Mg Tabs (Furosemide) .[name]... 1 by mouth once daily    Bystolic 20 Mg Tabs (Nebivolol hcl) .[name]... 1 by mouth  once daily change meds to above, f/u closely at home  Problem # 5:  DIABETES MELLITUS, TYPE II (ICD-250.00)  His updated medication list for this problem includes:    Micardis 80 Mg Tabs (Telmisartan) .[name]... 1 by mouth once daily    Adult Aspirin Ec Low Strength 81 Mg Tbec (Aspirin) .[name]... 1 by mouth once daily    Glimepiride 4 Mg Tabs (Glimepiride) .[name]... Take 1 by mouth two times a day    Metformin Hcl 500 Mg Xr24h-tab (Metformin hcl) .Marland KitchenMarland KitchenMarland KitchenMarland Kitchen 3 by mouth once  daily    Actos 45 Mg Tabs (Pioglitazone hcl) .[name]... 1po once daily  Labs Reviewed: Creat: 0.9 (03/28/2009)    Reviewed HgBA1c results: 9.8 (03/28/2009)  8.1 (10/11/2008) uncontrolled, to add the actos 45 mg, refer DM cinic for better diet education  Orders: Diabetic Clinic Referral (Diabetic)  Complete Medication List: 1)  Allopurinol 300 Mg Tabs (Allopurinol) .... Take 1 tablet by mouth once a day 2)  Cialis 20 Mg Tabs (Tadalafil) .... Take 1 tablet by mouth once a day prn 3)  Prilosec Otc 20 Mg Tbec (Omeprazole magnesium) .... Take 1 tablet by mouth once a day 4)  Simvastatin 40 Mg Tabs (Simvastatin) .... Take 1 tablet by mouth once a day 5)  Micardis 80 Mg Tabs (Telmisartan) .[name].. 1 by mouth once daily 6)  Furosemide 40 Mg Tabs (Furosemide) .[name].. 1 by mouth once daily 7)  Triamcinolone Acetonide 0.5 % Crea (Triamcinolone acetonide) .... Use asd  two times a day prn 8)  Klor-con 10 10 Meq Tbcr (Potassium chloride) .... 3 by mouth once daily 9)  Bystolic 20 Mg Tabs (Nebivolol hcl) .[name].. 1 by mouth  once daily 10)  Adult Aspirin Ec Low Strength 81 Mg Tbec (Aspirin) .[name].. 1 by mouth once daily 11)  Mucinex Maximum Strength 1200 Mg Tb12 (Guaifenesin) .[name].. 1 by mouth two times a day as needed 12)  Gfn 550/pse 60 550-60 Mg Xr12h-tab (Pseudoephedrine-guaifenesin) .[name].. 1 by mouth two times a day 13)  Glimepiride 4 Mg Tabs (Glimepiride) .... Take 1 by mouth two times a day 14)  Metformin Hcl 500 Mg Xr24h-tab (Metformin hcl) .... 3 by mouth once daily 15)  Freestyle Lite Test Strp (Glucose blood) .... Use asd 1 once daily 16)  Lancets Misc (Lancets) .... Use asd 1 once daily 17)  Actos 45 Mg Tabs (Pioglitazone hcl) .[name].. 1po once daily  Other Orders: Admin 1st Vaccine (10272) Flu Vaccine 46yrs + (53664)  Patient Instructions: 1)  you had the flu shot today 2)  stop the twynsta 3)  start the micardis 80 mg per day instead 4)  start the actos 45 mg per  day 5)  You will be contacted about the  referral(s) to: Diabetic clinic 6)  Continue all previous medications as before this visit  7)  Please schedule a follow-up appointment in 3 months with : 8)  BMP prior to visit, ICD-9: 250.02 9)  Lipid Panel prior to visit, ICD-9: 10)  HbgA1C prior to visit, ICD-9: Prescriptions: MUCINEX MAXIMUM STRENGTH 1200 MG  TB12 (GUAIFENESIN) 1 by mouth two times a day as needed  #180 x 3   Entered and Authorized by:   Corwin Levins MD   Signed by:   Corwin Levins MD on 03/30/2009   Method used:   Electronically to        [facility] [phone]* (retail)       [address]       [place], [place]  [address]       Ph: [phone]       Fax: [phone]   RxID:   [id number] LANCETS  MISC (LANCETS) use asd 1 once daily  #100 x 11   Entered and Authorized by:   Corwin Levins MD   Signed by:   Corwin Levins MD on 03/30/2009   Method used:   Electronically to        [facility] [phone]* (retail)       [address]       [place], [place]  [address]       Ph: [phone]       Fax: [phone]   RxID:   [id number] FREESTYLE LITE TEST  STRP (GLUCOSE BLOOD) use asd 1 once daily  #100 x 11   Entered and Authorized by:   Corwin Levins MD   Signed by:   Corwin Levins MD on 03/30/2009   Method used:   Electronically to        [facility] [phone]* (retail)       [address]       [place], [place]  [address]       Ph: [phone]       Fax: [phone]   RxID:   [id number] METFORMIN HCL 500 MG XR24H-TAB (METFORMIN HCL) 3 by mouth once daily  #270 x 3   Entered and Authorized by:   Corwin Levins MD   Signed by:   Corwin Levins MD on 03/30/2009   Method used:   Electronically to        [facility] [phone]* (retail)       [address]       [place], [place]  [address]       Ph: [phone]       Fax: [phone]   RxID:   [id number] BYSTOLIC 20 MG  TABS (NEBIVOLOL HCL) 1 by mouth  once daily  #90 x 3   Entered and Authorized by:   Corwin Levins MD   Signed by:   Corwin Levins MD on 03/30/2009   Method used:   Electronically to        [facility] [phone]* (retail)       [address]       [place], [place]  [address]  Ph: [phone]       Fax: [phone]   RxID:   [id number] TRIAMCINOLONE ACETONIDE 0.5 %  CREA (TRIAMCINOLONE ACETONIDE) use asd  two times a day prn  #1 x 1   Entered and Authorized by:   Corwin Levins MD   Signed by:   Corwin Levins MD on 03/30/2009   Method used:   Electronically to        [facility] [phone]* (retail)       [address]       [place], [place]  [address]       Ph: [phone]       Fax: [phone]   RxID:   [id number] FUROSEMIDE 40 MG  TABS (FUROSEMIDE) 1 by mouth once daily  #90 x 3   Entered and Authorized by:   Corwin Levins MD   Signed by:   Corwin Levins MD on 03/30/2009   Method used:   Electronically to        [facility] [phone]* (retail)       [address]       [place], [place]  [address]       Ph: [phone]       Fax: [phone]   RxID:   [id number] SIMVASTATIN 40 MG TABS (SIMVASTATIN) Take 1 tablet by mouth once a day  #90 x 3   Entered and Authorized by:   Corwin Levins MD   Signed by:   Corwin Levins MD on 03/30/2009   Method used:   Electronically to        [facility] [phone]* (retail)       [address]       [place], [place]  [address]       Ph: [phone]       Fax: [phone]   RxID:   [id number] PRILOSEC OTC 20 MG TBEC (OMEPRAZOLE MAGNESIUM) Take 1 tablet by mouth once a day  #42 x 11   Entered and Authorized by:   Corwin Levins MD   Signed by:   Corwin Levins MD on 03/30/2009   Method used:   Electronically to        [facility] [phone]* (retail)       [address]       [place], [place]  [address]       Ph: [phone]       Fax: [phone]   RxID:   [id number] CIALIS 20 MG TABS (TADALAFIL) Take 1 tablet by mouth once a day prn  #10 x 11   Entered and Authorized by:   Corwin Levins MD   Signed by:   Corwin Levins MD on 03/30/2009   Method used:   Electronically to        [facility] [phone]* (retail)       [address]       [place], [place]  [address]       Ph: [phone]       Fax: [phone]   RxID:   [id number] ALLOPURINOL 300 MG TABS (ALLOPURINOL) Take 1 tablet by mouth once a day  #90 x 3  Entered and Authorized by:   Corwin Levins MD   Signed by:   Corwin Levins MD on 03/30/2009   Method used:   Electronically to        [facility] [phone]* (retail)       [address]       [place], [place]  [address]       Ph: [phone]       Fax: [phone]   RxID:   [id number] ACTOS 45 MG TABS (PIOGLITAZONE HCL) 1po once daily  #90 x 3   Entered and Authorized by:   Corwin Levins MD   Signed by:   Corwin Levins MD on 03/30/2009   Method used:   Electronically to        [facility] [phone]* (retail)       [address]       [place], [place]  [address]       Ph: [phone]       Fax: [phone]   RxID:   [id number] MICARDIS 80 MG TABS (TELMISARTAN) 1 by mouth once daily  #90 x 3   Entered and Authorized by:   Corwin Levins MD   Signed by:   Corwin Levins MD on 03/30/2009   Method used:   Electronically to        [facility] [phone]* (retail)       [address]       [place], [place]  [address]       Ph: [phone]       Fax: [phone]   RxID:   [id number]     Flu Vaccine Consent Questions     Do you have a history of severe allergic reactions to this vaccine? no    Any prior history of allergic reactions to egg and/or gelatin? no    Do you have a sensitivity to  the preservative Thimersol? no    Do you have a past history of Guillan-Barre Syndrome? no    Do you currently have an acute febrile illness? no    Have you ever had a severe reaction to latex? no    Vaccine information given and explained to patient? yes    Are you currently pregnant? no    Lot Number:[id number]   Exp Date:11/17/2009   Site Given  Left Deltoid IMflu   Appended Document: CPX / $50 /NWS I realized after pt left that He had requested HIV testing (HIV screen)  robin - to call pt to inform, and arrange at his convenince    - v01.6  Appended Document: CPX / $50 /[facility] called pt and sch HIV v01.6 testing in lab 04/04/09

## 2010-06-20 NOTE — Letter (Signed)
Summary: [facility]  [facility]   Imported By: Sherian Rein 07/06/2009 10:35:28  _____________________________________________________________________  External Attachment:    Type:   Image     Comment:   External Document

## 2010-06-20 NOTE — Progress Notes (Signed)
Summary: problems with medication  Phone Note Call from Patient Call back at Home Phone [phone]   Caller: Patient Call For: Corwin Levins MD Summary of Call: per Jola Baptist call everysince he stated the medication  glimeparide  have experiencing  intense diarrhea , and stomach upset pls advise Initial call taken by: Shelbie Proctor,  April 06, 2008 9:00 AM  Follow-up for Phone Call        ok to change to glucotrol xl 10 two times a day - done escript Follow-up by: Corwin Levins MD,  April 06, 2008 1:14 PM  Additional Follow-up for Phone Call Additional follow up Details #1::        Phone Call Completed, Rx Called In pt made aware left msg on machine  Additional Follow-up by: Shelbie Proctor,  April 06, 2008 2:56 PM   New Allergies: * GLIMEPARIDE New/Updated Medications: GLUCOTROL 10 MG TABS (GLIPIZIDE) 1 by mouth two times a day New Allergies: * GLIMEPARIDE  Prescriptions: GLUCOTROL 10 MG TABS (GLIPIZIDE) 1 by mouth two times a day  #60 x 11   Entered and Authorized by:   Corwin Levins MD   Signed by:   Corwin Levins MD on 04/06/2008   Method used:   Electronically to        [facility] [phone]* (retail)       [address]       [place], [place]  [address]       Ph: [phone]       Fax: [phone]   RxID:   [id number]

## 2010-06-20 NOTE — Miscellaneous (Signed)
Summary: Orders Update   Clinical Lists Changes  Problems: Added new problem of DIVERTICULITIS, ACUTE (ICD-562.11) Orders: Added new Referral order of Gastroenterology Referral (GI) - Signed

## 2010-06-20 NOTE — Assessment & Plan Note (Signed)
Summary: 1 MO ROV /NWS  #   Vital Signs:  Patient profile:   60 year old male Height:      67.5 inches Weight:      234 pounds BMI:     36.24 O2 Sat:      95 % on Room air Temp:     97.1 degrees F oral Pulse rate:   55 / minute BP sitting:   152 / 92  (left arm) Cuff size:   large  Vitals Entered ByZella Ball Ewing (November 04, 2009 9:04 AM)  O2 Flow:  Room air  Preventive Care Screening  Colonoscopy:    Date:  09/29/2009    Next Due:  09/2012    Results:  no polyp per Dr Jalene Mullet   CC: 1 month ROV/RE   CC:  1 month ROV/RE.  History of Present Illness: here to f/u  - overall doing well.  Pt denies CP, sob, doe, wheezing, orthopnea, pnd, worsening LE edema, palps, dizziness or syncope  Pt denies new neuro symptoms such as headache, facial or extremity weakness   Pt denies polydipsia, polyuria, but has had several low sugar symptoms such as shakiness improved with eating.  Overall good compliance with meds, trying to follow low chol, DM diet, wt stable, little excercise however Has some mild LE edema stable no change on the actos. No fever, ST, cough , n/v/d, abd pain or rash.  Problems Prior to Update: 1)  Peripheral Edema  (ICD-782.3) 2)  Unspecified Disorder of Kidney and Ureter  (ICD-593.9) 3)  Plantar Fasciitis, Right  (ICD-728.71) 4)  Rash-nonvesicular  (ICD-782.1) 5)  Transaminases, Serum, Elevated  (ICD-790.4) 6)  Other Specified Forms of Hearing Loss  (ICD-389.8) 7)  Hypokalemia  (ICD-276.8) 8)  Plantar Wart  (ICD-078.12) 9)  Electrocardiogram, Abnormal  (ICD-794.31) 10)  Preventive Health Care  (ICD-V70.0) 11)  Sebaceous Cyst, Infected  (ICD-706.2) 12)  Depression  (ICD-311) 13)  Ibs  (ICD-564.1) 14)  Allergic Rhinitis  (ICD-477.9) 15)  Anxiety  (ICD-300.00) 16)  Hyperlipidemia  (ICD-272.4) 17)  Gout  (ICD-274.9) 18)  Gerd  (ICD-530.81) 19)  Rash-nonvesicular  (ICD-782.1) 20)  Hypertension  (ICD-401.9) 21)  Diabetes Mellitus, Type II  (ICD-250.00) 22)   Sinusitis- Acute-nos  (ICD-461.9) 23)  Colonic Polyps, Hx of  (ICD-V12.72)  Medications Prior to Update: 1)  Allopurinol 300 Mg Tabs (Allopurinol) .... Take 1 Tablet By Mouth Once A Day 2)  Cialis 20 Mg Tabs (Tadalafil) .... Take 1 Tablet By Mouth Once A Day Prn 3)  Prilosec Otc 20 Mg Tbec (Omeprazole Magnesium) .... Take 1 Tablet By Mouth Once A Day 4)  Simvastatin 40 Mg Tabs (Simvastatin) .... Take 1 Tablet By Mouth Once A Day 5)  Micardis 80 Mg Tabs (Telmisartan) .[name].. 1 By Mouth Once Daily 6)  Furosemide 40 Mg  Tabs (Furosemide) .[name].. 1 By Mouth Two Times A Day 7)  Lotrisone 1-0.05 % Crea (Clotrimazole-Betamethasone) .... Use Asd Two Times A Day As Needed 8)  Klor-Con 10 10 Meq  Tbcr (Potassium Chloride) .... 3 By Mouth Once Daily 9)  Bystolic 20 Mg Tabs (Nebivolol Hcl) .[name].. 1 By Mouth  Once Daily 10)  Adult Aspirin Ec Low Strength 81 Mg  Tbec (Aspirin) .[name].. 1 By Mouth Once Daily 11)  Mucinex Maximum Strength 1200 Mg  Tb12 (Guaifenesin) .[name].. 1 By Mouth Two Times A Day As Needed 12)  Gfn 550/pse 60 550-60 Mg Xr12h-Tab (Pseudoephedrine-Guaifenesin) .[name].. 1 By Mouth Two Times A Day 13)  Glimepiride  4 Mg Tabs (Glimepiride) .... Take 2 By Mouth Qam 14)  Metformin Hcl 500 Mg Xr24h-Tab (Metformin Hcl) .... 3 By Mouth Once Daily 15)  Lancets  Misc (Lancets) .... Use Asd 1 Once Daily 16)  Actos 45 Mg Tabs (Pioglitazone Hcl) .[name].. 1po Once Daily 17)  Diltiazem Hcl Cr 240 Mg Xr24h-Cap (Diltiazem Hcl) .[name].. 1po Once Daily 18)  Hydralazine Hcl 50 Mg Tabs (Hydralazine Hcl) .[name].. 1 By Mouth Three Times A Day  Current Medications (verified): 1)  Allopurinol 300 Mg Tabs (Allopurinol) .... Take 1 Tablet By Mouth Once A Day 2)  Cialis 20 Mg Tabs (Tadalafil) .... Take 1 Tablet By Mouth Once A Day Prn 3)  Prilosec Otc 20 Mg Tbec (Omeprazole Magnesium) .... Take 1 Tablet By Mouth Once A Day 4)  Simvastatin 40 Mg Tabs (Simvastatin) .... Take 1 Tablet By Mouth Once A Day 5)  Benicar 40 Mg Tabs (Olmesartan  Medoxomil) .[name].. 1po Once Daily 6)  Furosemide 40 Mg  Tabs (Furosemide) .[name].. 1 By Mouth Two Times A Day 7)  Klor-Con 10 10 Meq  Tbcr (Potassium Chloride) .... 3 By Mouth Once Daily 8)  Bystolic 20 Mg Tabs (Nebivolol Hcl) .[name].. 1 By Mouth  Once Daily 9)  Adult Aspirin Ec Low Strength 81 Mg  Tbec (Aspirin) .[name].. 1 By Mouth Once Daily 10)  Mucinex Maximum Strength 1200 Mg  Tb12 (Guaifenesin) .[name].. 1 By Mouth Two Times A Day As Needed 11)  Gfn 550/pse 60 550-60 Mg Xr12h-Tab (Pseudoephedrine-Guaifenesin) .[name].. 1 By Mouth Two Times A Day 12)  Glimepiride 4 Mg Tabs (Glimepiride) .... Take 2 By Mouth Qam 13)  Metformin Hcl 500 Mg Xr24h-Tab (Metformin Hcl) .... 3 By Mouth Once Daily 14)  Lancets  Misc (Lancets) .... Use Asd 1 Once Daily 15)  Actos 30 Mg Tabs (Pioglitazone Hcl) .[name].. 1 By Mouth Once Daily 16)  Diltiazem Hcl Cr 240 Mg Xr24h-Cap (Diltiazem Hcl) .[name].. 1po Once Daily 17)  Hydralazine Hcl 50 Mg Tabs (Hydralazine Hcl) .[name].. 1 By Mouth Three Times A Day 18)  Alprazolam 0.5 Mg Tabs (Alprazolam) .... 1/2 - 1 By Mouth Once Daily As Needed  Allergies (verified): 1)  ! Accupril 2)  ! * Lexapro 3)  * Glimeparide  Past History:  Past Medical History: Last updated: 06/25/2007 Colonic polyps, hx of Diabetes mellitus, type II Hypertension GERD insomnia Gout E.D. 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   Head:  normocephalic and atraumatic.   Eyes:  vision grossly intact, pupils equal, and pupils round.     Ears:  R ear normal and L ear normal.   Nose:  no external deformity and no nasal discharge.   Mouth:  no gingival abnormalities and pharynx pink and moist.   Neck:  supple and no masses.   Lungs:  normal respiratory effort and normal breath sounds.   Heart:  normal rate and regular rhythm.   Extremities:  trace to 1+ edema bilat Psych:  not depressed appearing and moderately anxious.     Impression & Recommendations:  Problem #  1:  DIABETES MELLITUS, TYPE II (ICD-250.00)  His updated medication list for this problem includes:    Benicar 40 Mg Tabs (Olmesartan medoxomil) .[name]... 1po once daily    Adult Aspirin Ec Low Strength 81 Mg Tbec (Aspirin) .[name]... 1 by mouth once daily    Glimepiride 4 Mg Tabs (Glimepiride) .[name]... Take 2 by mouth qam    Metformin Hcl 500 Mg Xr24h-tab (Metformin hcl) .Marland KitchenMarland KitchenMarland KitchenMarland Kitchen 3 by mouth once daily    Actos 30 Mg Tabs (Pioglitazone hcl) .[name]... 1 by mouth once daily overcontrolled - to decrease the actos to 30 mg  Labs Reviewed: Creat: 1.1 (09/29/2009)    Reviewed HgBA1c results: 6.0 (09/29/2009)  9.8 (03/28/2009)  Problem # 2:  PERIPHERAL EDEMA (ICD-782.3)  His updated medication list for this problem includes:    Furosemide 40 Mg Tabs (Furosemide) .[name]... 1 by mouth two times a day Continue all previous medications as before this visit , hopefully to improve  with reducing the actos above  Problem # 3:  HYPERTENSION (ICD-401.9)  His updated medication list for this problem includes:    Benicar 40 Mg Tabs (Olmesartan medoxomil) .[name]... 1po once daily    Furosemide 40 Mg Tabs (Furosemide) .[name]... 1 by mouth two times a day    Bystolic 20 Mg Tabs (Nebivolol hcl) .[name]... 1 by mouth  once daily    Diltiazem Hcl Cr 240 Mg Xr24h-cap (Diltiazem hcl) .[name]... 1po once daily    Hydralazine Hcl 50 Mg Tabs (Hydralazine hcl) .[name]... 1 by mouth three times a day try change micardis to  benicar 40 mg for better efficacy and control  BP today: 152/92 Prior BP: 152/84  (10/03/2009)  Labs Reviewed: K+: 3.7 (09/29/2009) Creat: : 1.1 (09/29/2009)   Chol: 129 (03/28/2009)   HDL: 30.60 (03/28/2009)   LDL: DEL (03/18/2008)   TG: 254.0 (03/28/2009)  Problem # 4:  UNSPECIFIED DISORDER OF KIDNEY AND URETER (ICD-593.9) small 15 mm exophytic lesion to left renal - refer to dr Patsi Sears  Complete Medication List: 1)  Allopurinol 300 Mg Tabs (Allopurinol) .... Take 1 tablet by mouth once a day 2)  Cialis 20 Mg Tabs (Tadalafil) .... Take 1 tablet by mouth once a day prn 3)  Prilosec Otc 20 Mg Tbec (Omeprazole magnesium) .... Take 1 tablet by mouth once a day 4)  Simvastatin 40 Mg Tabs (Simvastatin) .... Take 1 tablet by mouth once a day 5)  Benicar 40 Mg Tabs (Olmesartan medoxomil) .[name].. 1po once daily 6)  Furosemide 40 Mg Tabs (Furosemide) .[name].. 1 by mouth two times a day 7)  Klor-con 10 10 Meq Tbcr (Potassium chloride) .... 3 by mouth once daily 8)  Bystolic 20 Mg Tabs (Nebivolol hcl) .[name].. 1 by mouth  once daily 9)  Adult Aspirin Ec Low Strength 81 Mg Tbec (Aspirin) .[name].. 1 by mouth once daily 10)  Mucinex Maximum Strength 1200 Mg Tb12 (Guaifenesin) .[name].. 1 by mouth two times a day as needed 11)  Gfn 550/pse 60 550-60 Mg Xr12h-tab (Pseudoephedrine-guaifenesin) .[name].. 1 by mouth two times a day 12)  Glimepiride 4 Mg Tabs (Glimepiride) .... Take 2 by mouth qam 13)  Metformin Hcl 500 Mg Xr24h-tab (Metformin hcl) .... 3 by mouth once daily 14)  Lancets Misc (Lancets) .... Use asd 1 once daily 15)  Actos 30 Mg Tabs (Pioglitazone hcl) .[name].. 1 by mouth once daily 16)  Diltiazem Hcl Cr 240 Mg Xr24h-cap (Diltiazem hcl) .[name].. 1po once daily 17)  Hydralazine Hcl 50 Mg Tabs (Hydralazine hcl) .[name].. 1 by  mouth three times a day 18)  Alprazolam 0.5 Mg Tabs (Alprazolam) .... 1/2 - 1 by mouth once daily as needed  Patient Instructions: 1)  stop the micardis 2)  start the benicar at 40 mg per day 3)  decrease the actos to 30 mg per day 4)  Please take all new medications as prescribed  - the alprazolam 5)  Continue all previous medications as before this visit  6)  Please keep your appt with Dr Patsi Sears as scheduled 7)  Please look into the 3 month medications at [facility] to see if costs less 8)  call the number on the Benicar card to sign up for the program 9)  Please schedule a follow-up appointment in 5 months with CPX labs and: 10)  HbgA1C prior to visit, ICD-9: 250.02 11)  Urine Microalbumin prior to visit, ICD-9: 12)  Check your Blood Pressure regularly. If it is above: 140/90 you should make an appointment sooner Prescriptions: ALPRAZOLAM 0.5 MG TABS (ALPRAZOLAM) 1/2 - 1 by mouth once daily as needed  #90 x 3   Entered and Authorized by:   Corwin Levins MD   Signed by:   Corwin Levins MD on 11/04/2009   Method used:   Print then Give to Patient   RxID:   [id number] HYDRALAZINE HCL 50 MG TABS (HYDRALAZINE HCL) 1 by mouth three times a day  #270 x 3   Entered and Authorized by:   Corwin Levins MD   Signed by:   Corwin Levins MD on 11/04/2009   Method used:   Print then Give to Patient   RxID:   [id number] DILTIAZEM HCL CR 240 MG XR24H-CAP (DILTIAZEM HCL) 1po once daily  #90 x 3   Entered and Authorized by:   Corwin Levins MD   Signed by:   Corwin Levins MD on 11/04/2009   Method used:   Print then Give to Patient   RxID:   [id number] ACTOS 30 MG TABS (PIOGLITAZONE HCL) 1 by mouth once daily  #90 x 3   Entered and Authorized by:   Corwin Levins MD   Signed by:   Corwin Levins MD on 11/04/2009   Method used:   Print then Give to Patient   RxID:   [id number] ACTOS 30 MG TABS (PIOGLITAZONE HCL) 1 by mouth once daily  #90 x 3   Entered and Authorized by:   Corwin Levins MD   Signed by:   Corwin Levins MD on 11/04/2009   Method used:   Print then Give to Patient   RxID:   [id number] METFORMIN HCL 500 MG XR24H-TAB (METFORMIN HCL) 3 by mouth once daily  #270 x 3   Entered and Authorized by:   Corwin Levins MD   Signed by:   Corwin Levins MD on  11/04/2009   Method used:   Print then Give to Patient   RxID:   [id number] GLIMEPIRIDE 4 MG TABS (GLIMEPIRIDE) take 2 by mouth qam  #180 x 3   Entered and Authorized by:   Corwin Levins MD   Signed by:   Corwin Levins MD on 11/04/2009   Method used:   Print then Give to Patient   RxID:   [id number] BYSTOLIC 20 MG TABS (NEBIVOLOL HCL) 1 by mouth  once daily  #90 x 3   Entered and Authorized by:   Corwin Levins MD   Signed by:   Len Blalock  John MD on 11/04/2009   Method used:   Print then Give to Patient   RxID:   [id number] KLOR-CON 10 10 MEQ  TBCR (POTASSIUM CHLORIDE) 3 by mouth once daily  #270 x 3   Entered and Authorized by:   Corwin Levins MD   Signed by:   Corwin Levins MD on 11/04/2009   Method used:   Print then Give to Patient   RxID:   [id number] FUROSEMIDE 40 MG  TABS (FUROSEMIDE) 1 by mouth two times a day  #180 x 3   Entered and Authorized by:   Corwin Levins MD   Signed by:   Corwin Levins MD on 11/04/2009   Method used:   Print then Give to Patient   RxID:   [id number] BENICAR 40 MG TABS (OLMESARTAN MEDOXOMIL) 1po once daily  #90 x 3   Entered and Authorized by:   Corwin Levins MD   Signed by:   Corwin Levins MD on 11/04/2009   Method used:   Print then Give to Patient   RxID:   [id number] BENICAR 40 MG TABS (OLMESARTAN MEDOXOMIL) 1po once daily  #90 x 3   Entered and Authorized by:   Corwin Levins MD   Signed by:   Corwin Levins MD on 11/04/2009   Method used:   Print then Give to Patient   RxID:   [id number] SIMVASTATIN 40 MG TABS (SIMVASTATIN) Take 1 tablet by mouth once a day  #90 x 3   Entered and Authorized by:   Corwin Levins MD   Signed by:   Corwin Levins MD on 11/04/2009   Method used:   Print then Give to Patient   RxID:   [id number] PRILOSEC OTC 20 MG TBEC (OMEPRAZOLE MAGNESIUM) Take 1 tablet by mouth once a day  #126 x 3   Entered and Authorized by:   Corwin Levins MD   Signed by:   Corwin Levins MD on 11/04/2009    Method used:   Print then Give to Patient   RxID:   [id number] CIALIS 20 MG TABS (TADALAFIL) Take 1 tablet by mouth once a day prn  #10 x 11   Entered and Authorized by:   Corwin Levins MD   Signed by:   Corwin Levins MD on 11/04/2009   Method used:   Print then Give to Patient   RxID:   [id number] ALLOPURINOL 300 MG TABS (ALLOPURINOL) Take 1 tablet by mouth once a day  #90 x 3   Entered and Authorized by:   Corwin Levins MD   Signed by:   Corwin Levins MD on 11/04/2009   Method used:   Print then Give to Patient   RxID:   [id number]

## 2010-06-20 NOTE — Assessment & Plan Note (Signed)
Summary: 3 mos /fu / cd   Vital Signs:  Patient profile:   60 year old male Height:      68 inches Weight:      225 pounds BMI:     34.33 O2 Sat:      98 % on Room air Temp:     97.4 degrees F oral Pulse rate:   56 / minute BP sitting:   152 / 90  (left arm) Cuff size:   large  Vitals Entered ByZella Ball Ewing (July 06, 2009 1:52 PM)  O2 Flow:  Room air CC: 3 Month followup/RE   CC:  3 Month followup/RE.  History of Present Illness: here after being seen at [facility] with a1c yesterday at 6.6!!  some confusion about the glimeparide in that our record showed 4 mg two times a day, and the bottle says 2 in the AM;  Pt denies CP, sob, doe, wheezing, orthopnea, pnd, worsening LE edema, palps, dizziness or syncope  Pt denies new neuro symptoms such as headache, facial or extremity weakness   Pt denies polydipsia, polyuria, or low sugar symptoms such as shakiness improved with eating.  Overall good compliance with meds, trying to follow low chol, DM diet, wt stable, little excercise however   Also with small area itchy rash to ant abd area, without pain or fever or red streaks.    Problems Prior to Update: 1)  Rash-nonvesicular  (ICD-782.1) 2)  Transaminases, Serum, Elevated  (ICD-790.4) 3)  Other Specified Forms of Hearing Loss  (ICD-389.8) 4)  Hypokalemia  (ICD-276.8) 5)  Plantar Wart  (ICD-078.12) 6)  Electrocardiogram, Abnormal  (ICD-794.31) 7)  Preventive Health Care  (ICD-V70.0) 8)  Sebaceous Cyst, Infected  (ICD-706.2) 9)  Depression  (ICD-311) 10)  Ibs  (ICD-564.1) 11)  Allergic Rhinitis  (ICD-477.9) 12)  Anxiety  (ICD-300.00) 13)  Hyperlipidemia  (ICD-272.4) 14)  Gout  (ICD-274.9) 15)  Gerd  (ICD-530.81) 16)  Rash-nonvesicular  (ICD-782.1) 17)  Hypertension  (ICD-401.9) 18)  Diabetes Mellitus, Type II  (ICD-250.00) 19)  Sinusitis- Acute-nos  (ICD-461.9) 20)  Colonic Polyps, Hx of  (ICD-V12.72)  Medications Prior to Update: 1)  Allopurinol 300 Mg Tabs (Allopurinol)  .... Take 1 Tablet By Mouth Once A Day 2)  Cialis 20 Mg Tabs (Tadalafil) .... Take 1 Tablet By Mouth Once A Day Prn 3)  Prilosec Otc 20 Mg Tbec (Omeprazole Magnesium) .... Take 1 Tablet By Mouth Once A Day 4)  Simvastatin 40 Mg Tabs (Simvastatin) .... Take 1 Tablet By Mouth Once A Day 5)  Micardis 80 Mg Tabs (Telmisartan) .[name].. 1 By Mouth Once Daily 6)  Furosemide 40 Mg  Tabs (Furosemide) .[name].. 1 By Mouth Once Daily 7)  Triamcinolone Acetonide 0.5 %  Crea (Triamcinolone Acetonide) .... Use Asd  Two Times A Day Prn 8)  Klor-Con 10 10 Meq  Tbcr (Potassium Chloride) .... 3 By Mouth Once Daily 9)  Bystolic 20 Mg Tabs (Nebivolol Hcl) .[name].. 1 By Mouth  Once Daily 10)  Adult Aspirin Ec Low Strength 81 Mg  Tbec (Aspirin) .[name].. 1 By Mouth Once Daily 11)  Mucinex Maximum Strength 1200 Mg  Tb12 (Guaifenesin) .[name].. 1 By Mouth Two Times A Day As Needed 12)  Gfn 550/pse 60 550-60 Mg Xr12h-Tab (Pseudoephedrine-Guaifenesin) .[name].. 1 By Mouth Two Times A Day 13)  Glimepiride 4 Mg Tabs (Glimepiride) .... Take 1 By Mouth Two Times A Day 14)  Metformin Hcl 500 Mg Xr24h-Tab (Metformin Hcl) .... 3 By Mouth Once Daily  15)  Freestyle Lite Test  Strp (Glucose Blood) .... Use Asd 1 Once Daily 16)  Lancets  Misc (Lancets) .... Use Asd 1 Once Daily 17)  Actos 45 Mg Tabs (Pioglitazone Hcl) .[name].. 1po Once Daily  Current Medications (verified): 1)  Allopurinol 300 Mg Tabs (Allopurinol) .... Take 1 Tablet By Mouth Once A Day 2)  Cialis 20 Mg Tabs (Tadalafil) .... Take 1 Tablet By Mouth Once A Day Prn 3)  Prilosec Otc 20 Mg Tbec (Omeprazole Magnesium) .... Take 1 Tablet By Mouth Once A Day 4)  Simvastatin 40 Mg Tabs (Simvastatin) .... Take 1 Tablet By Mouth Once A Day 5)  Micardis 80 Mg Tabs (Telmisartan) .[name].. 1 By Mouth Once Daily 6)  Furosemide 40 Mg  Tabs (Furosemide) .[name].. 1 By Mouth Once Daily 7)  Lotrisone 1-0.05 % Crea (Clotrimazole-Betamethasone) .... Use Asd Two Times A Day As Needed 8)  Klor-Con 10 10 Meq  Tbcr (Potassium  Chloride) .... 3 By Mouth Once Daily 9)  Bystolic 20 Mg Tabs (Nebivolol Hcl) .[name].. 1 By Mouth  Once Daily 10)  Adult Aspirin Ec Low Strength 81 Mg  Tbec (Aspirin) .[name].. 1 By Mouth Once Daily 11)  Mucinex Maximum Strength 1200 Mg  Tb12 (Guaifenesin) .[name].. 1 By Mouth Two Times A Day As Needed 12)  Gfn 550/pse 60 550-60 Mg Xr12h-Tab (Pseudoephedrine-Guaifenesin) .[name].. 1 By Mouth Two Times A Day 13)  Glimepiride 4 Mg Tabs (Glimepiride) .... Take 2 By Mouth Qam 14)  Metformin Hcl 500 Mg Xr24h-Tab (Metformin Hcl) .... 3 By Mouth Once Daily 15)  Accu-Chek Aviva  Strp (Glucose Blood) .... Use Asd Two Times A Day 16)  Lancets  Misc (Lancets) .... Use Asd 1 Once Daily 17)  Actos 45 Mg Tabs (Pioglitazone Hcl) .[name].. 1po Once Daily 18)  Diltiazem Hcl Cr 240 Mg Xr24h-Cap (Diltiazem Hcl) .[name].. 1po Once Daily  Allergies (verified): 1)  ! Accupril 2)  ! * Lexapro 3)  * Glimeparide  Past History:  Past Medical History: Last updated: 06/25/2007 Colonic polyps, hx of Diabetes mellitus, type II Hypertension GERD insomnia Gout E.D. 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   Head:  normocephalic and atraumatic.   Eyes:  vision grossly intact, pupils equal, and pupils round.   Ears:  R ear normal and L ear normal.   Nose:  no external deformity and no nasal discharge.   Mouth:  no gingival abnormalities  and pharynx pink and moist.   Neck:  supple and no masses.   Lungs:  normal respiratory effort and normal breath sounds.   Heart:  normal rate and regular rhythm.   Extremities:  no edema, no erythema  Skin:  2 cm area faint superfic rash left ant abd area nontender nonvesicular, macular   Impression & Recommendations:  Problem # 1:  DIABETES MELLITUS, TYPE II (ICD-250.00) Assessment Improved  His updated medication list for this problem includes:    Micardis 80 Mg  Tabs (Telmisartan) .[name]... 1 by mouth once daily    Adult Aspirin Ec Low Strength 81 Mg Tbec (Aspirin) .[name]... 1 by mouth once daily    Glimepiride 4 Mg Tabs (Glimepiride) .[name]... Take 2 by mouth qam    Metformin Hcl 500 Mg Xr24h-tab (Metformin hcl) .Marland KitchenMarland KitchenMarland KitchenMarland Kitchen 3 by mouth once daily    Actos 45 Mg Tabs (Pioglitazone hcl) .[name]... 1po once daily  Labs Reviewed: Creat: 0.9 (03/28/2009)    Reviewed HgBA1c results: 9.8 (03/28/2009)  8.1 (10/11/2008)  MOST RECENT a1c is 6.6 on feb 10 (outside lab) per pt report;  overall excellent control, cont meds as above; cont to monitor closely at two times a day cbg's and call for any low sugars less than 90 or any with symptoms; advised to hold OHA for sick days;    Problem # 2:  HYPERTENSION (ICD-401.9)  His updated medication list for this problem includes:    Micardis 80 Mg Tabs (Telmisartan) .[name]... 1 by mouth once daily    Furosemide 40 Mg Tabs (Furosemide) .[name]... 1 by mouth once daily    Bystolic 20 Mg Tabs (Nebivolol hcl) .[name]... 1 by mouth  once daily    Diltiazem Hcl Cr 240 Mg Xr24h-cap (Diltiazem hcl) .[name]... 1po once daily  BP today: 152/90 Prior BP: 138/92 (03/30/2009)  Labs Reviewed: K+: 3.1 (03/28/2009) Creat: : 0.9 (03/28/2009)   Chol: 129 (03/28/2009)   HDL: 30.60 (03/28/2009)   LDL: DEL (03/18/2008)   TG: 254.0 (03/28/2009) uncontrolled - add med as above, check BP at home, f/u next visit  Problem # 3:  HYPERLIPIDEMIA (ICD-272.4)  His updated medication list for this problem  includes:    Simvastatin 40 Mg Tabs (Simvastatin) .[name]... Take 1 tablet by mouth once a day  Labs Reviewed: SGOT: 51 (03/28/2009)   SGPT: 66 (03/28/2009)   HDL:30.60 (03/28/2009), 26.30 (10/11/2008)  LDL:DEL (03/18/2008), 69 (45/40/9811)  Chol:129 (03/28/2009), 130 (10/11/2008)  Trig:254.0 (03/28/2009), 263.0 (10/11/2008) stable overall by hx and exam, ok to continue meds/tx as is   Problem # 4:  RASH-NONVESICULAR (ICD-782.1)  His updated medication list for this problem includes:    Lotrisone 1-0.05 % Crea (Clotrimazole-betamethasone) ..... Use asd two times a day as needed treat as above, f/u any worsening signs or symptoms   Complete Medication List: 1)  Allopurinol 300 Mg Tabs (Allopurinol) .... Take 1 tablet by mouth once a day 2)  Cialis 20 Mg Tabs (Tadalafil) .... Take 1 tablet by mouth once a day prn 3)  Prilosec Otc 20 Mg Tbec (Omeprazole magnesium) .... Take 1 tablet by mouth once a day 4)  Simvastatin 40 Mg Tabs (Simvastatin) .... Take 1 tablet by mouth once a day 5)  Micardis 80 Mg Tabs (Telmisartan) .[name].. 1 by mouth once daily 6)  Furosemide 40 Mg Tabs (Furosemide) .[name].. 1 by mouth once daily 7)  Lotrisone 1-0.05 % Crea (Clotrimazole-betamethasone) .... Use asd two times a day as needed 8)  Klor-con 10 10 Meq Tbcr (Potassium chloride) .... 3 by mouth once daily 9)  Bystolic 20 Mg Tabs (Nebivolol hcl) .[name].. 1 by mouth  once daily 10)  Adult Aspirin Ec Low Strength 81 Mg Tbec (Aspirin) .[name].. 1 by mouth once daily 11)  Mucinex Maximum Strength 1200 Mg Tb12 (Guaifenesin) .[name].. 1 by mouth two times a day as needed 12)  Gfn 550/pse 60 550-60 Mg Xr12h-tab (Pseudoephedrine-guaifenesin) .[name].. 1 by mouth two times a day 13)  Glimepiride 4 Mg Tabs (Glimepiride) .... Take 2 by mouth qam 14)  Metformin  Hcl 500 Mg Xr24h-tab (Metformin hcl) .... 3 by mouth once daily 15)  Accu-chek Aviva Strp (Glucose blood) .... Use asd two times a day 16)  Lancets Misc (Lancets) .... Use asd 1 once daily 17)   Actos 45 Mg Tabs (Pioglitazone hcl) .[name].. 1po once daily 18)  Diltiazem Hcl Cr 240 Mg Xr24h-cap (Diltiazem hcl) .[name].. 1po once daily  Patient Instructions: 1)  start the diltiazem ER  - 1 per day  2)  start the cream for the rash 3)  your medications were sent to the pharmacy on the computer 4)  Continue all previous medications as before this visit  5)  If you have recurrent low sugars during the day, please reduce the glimeparide in the AM to 1 and 1/2 pills only 6)  Please schedule a follow-up appointment in Nov 2011 with CPX labs and: 7)  HbgA1C prior to visit, ICD-9: 250.02 8)  Urine Microalbumin prior to visit, ICD-9: Prescriptions: LOTRISONE 1-0.05 % CREA (CLOTRIMAZOLE-BETAMETHASONE) use asd two times a day as needed  #1 x 1   Entered and Authorized by:   Corwin Levins MD   Signed by:   Corwin Levins MD on 07/06/2009   Method used:   Electronically to        [facility]. [phone]* (retail)       [address]       [place], [place]  [address]       Ph: [phone]       Fax: [phone]   RxID:   [id number] DILTIAZEM HCL CR 240 MG XR24H-CAP (DILTIAZEM HCL) 1po once daily  #90 x 3   Entered and Authorized by:   Corwin Levins MD   Signed by:   Corwin Levins MD on 07/06/2009   Method used:   Electronically to        [facility]. [phone]* (retail)       [address]       [place], [place]  [address]       Ph: [phone]       Fax: [phone]   RxID:   [id number] ACCU-CHEK AVIVA  STRP (GLUCOSE BLOOD) use asd two times a day  #200 x 99   Entered and Authorized by:   Corwin Levins MD   Signed by:   Corwin Levins MD on 07/06/2009   Method used:   Electronically to        [facility]. [phone]* (retail)       [address]       [place], [place]  [address]       Ph: [phone]       Fax: [phone]   RxID:   [id number]

## 2010-06-20 NOTE — Progress Notes (Signed)
Summary: Test strips  Phone Note Call from Patient   Summary of Call: I rec'd note that PA was required for patient Aviva test strips. I spoke with [facility] and a PA cannot be done. Patient plan will only cover 153 strips for every 90 days. Any addition qty must be paid for out of pocket. Sent new prescription to pharmacy. Patient notified. Initial call taken by: Lucious Groves,  July 07, 2009 11:43 AM  Follow-up for Phone Call        noted Follow-up by: Corwin Levins MD,  July 07, 2009 2:27 PM    New/Updated Medications: ACCU-CHEK AVIVA  STRP (GLUCOSE BLOOD) use asd two times a day Prescriptions: ACCU-CHEK AVIVA  STRP (GLUCOSE BLOOD) use asd two times a day  #153 x 3   Entered by:   Lucious Groves   Authorized by:   Corwin Levins MD   Signed by:   Lucious Groves on 07/07/2009   Method used:   Electronically to        [facility]. [phone]* (retail)       [address]       [place], [place]  [address]       Ph: [phone]       Fax: [phone]   RxID:   [id number]

## 2010-06-22 NOTE — Consult Note (Signed)
Summary: [facility]  [facility]   Imported By: Lennie Odor 06/15/2010 09:46:08  _____________________________________________________________________  External Attachment:    Type:   Image     Comment:   External Document

## 2010-06-22 NOTE — Assessment & Plan Note (Signed)
Summary: ?bladder inf/cd   Vital Signs:  Patient profile:   60 year old male Height:      68 inches Weight:      224 pounds BMI:     34.18 O2 Sat:      96 % on Room air Temp:     98.1 degrees F oral Pulse rate:   67 / minute BP sitting:   142 / 82  (left arm) Cuff size:   large  Vitals Entered By: Zella Ball Ewing CMA Duncan Dull) (May 02, 2010 9:05 AM)  O2 Flow:  Room air CC: Bladder Infection/RE   CC:  Bladder Infection/RE.  History of Present Illness: here to f/u- with 2-3 days urinary freq, small amounts, better and worse, worse yesterday;  some small flow and "sputtering"  ,  started canberry juice yesterday and seemed to improved somewhat;  still nocuturia hourly lasat night;  then onset pain with urination this am,  + lower abd pain but thinks left over from the diverticulitis;  no real bowel change except straining somewhat; stopped the cipro extended course for the diverticulitis  (cipro and flagyl actually ) early last wk;  no bllod in the urine,  no penile d/c;  had some back pain about 10 days ago to the right lower back sort of throbbing at hte lower lumbar area but not presnet now,  no flank pain; no n/v, or overt chills or high fever, but may have had some feeling warm.  Also had an usuual weakness general but manifested by marked difficulty with standing up and had to be helped to do so when out to eat lsat friday, but this resolved by the next day with tylenol.  No prior kidney stone or UTI/pyelo  or BPH.    30 yrs ago dx with prostatitis - none since then.    BP mild elev today, stayed on the bystolic since his BP at home after last visit was better.   No OTC nsaids ro decongestants. Pt denies polydipsia, polyuria, or low sugar symptoms such as shakiness improved with eating.  Overall good compliance with meds, trying to follow low chol, DM diet, wt stable, little excercise however   Problems Prior to Update: 1)  Urinary Hesitancy  (ICD-788.64) 2)  Dysuria  (ICD-788.1) 3)   Joint Effusion, Right Knee  (ICD-719.06) 4)  Preoperative Examination  (ICD-V72.84) 5)  Diverticulitis, Acute  (ICD-562.11) 6)  Diverticulitis, Acute  (ICD-562.11) 7)  Diarrhea of Presumed Infectious Origin  (ICD-009.3) 8)  Abdominal Pain, Unspecified Site  (ICD-789.00) 9)  Peripheral Edema  (ICD-782.3) 10)  Unspecified Disorder of Kidney and Ureter  (ICD-593.9) 11)  Plantar Fasciitis, Right  (ICD-728.71) 12)  Rash-nonvesicular  (ICD-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) 13)  Transaminases, Serum, Elevated  (ICD-790.4) 14)  Other Specified Forms of Hearing Loss  (ICD-389.8) 15)  Hypokalemia  (ICD-276.8) 16)  Plantar Wart  (ICD-078.12) 17)  Electrocardiogram, Abnormal  (ICD-794.31) 18)  Preventive Health Care  (ICD-V70.0) 19)  Sebaceous Cyst, Infected  (ICD-706.2) 20)  Depression  (ICD-311) 21)  Ibs  (ICD-564.1) 22)  Allergic Rhinitis  (ICD-477.9) 23)  Anxiety  (ICD-300.00) 24)  Hyperlipidemia  (ICD-272.4) 25)  Gout  (ICD-274.9) 26)  Gerd  (ICD-530.81) 27)  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 Take 1 Tablet By Mouth Once A Day  2)  Cialis 20 Mg Tabs (Tadalafil) .... Take 1 Tablet By Mouth Once A Day Prn 3)  Prilosec Otc 20 Mg Tbec (Omeprazole Magnesium) .... Take 1 Tablet By Mouth Once A Day 4)  Simvastatin 40 Mg Tabs (Simvastatin) .... Take 1/2 Tablet By Mouth Once A Day 5)  Micardis 80 Mg Tabs (Telmisartan) .[name].. 1po Once Daily 6)  Furosemide 40 Mg  Tabs (Furosemide) .[name].. 1 By Mouth Once Daily 7)  Klor-Con 10 10 Meq  Tbcr (Potassium Chloride) .... 3 By Mouth Once Daily 8)  Bystolic 20 Mg Tabs (Nebivolol Hcl) .[name].. 1 By Mouth  Once Daily 9)  Adult Aspirin Ec Low Strength 81 Mg  Tbec (Aspirin) .[name].. 1 By Mouth Once Daily 10)  Mucinex Maximum Strength 1200 Mg  Tb12 (Guaifenesin) .[name].. 1 By  Mouth Two Times A Day As Needed 11)  Gfn 550/pse 60 550-60 Mg Xr12h-Tab (Pseudoephedrine-Guaifenesin) .[name].. 1 By Mouth Two Times A Day 12)  Glimepiride 4 Mg Tabs (Glimepiride) .... Take 2 By Mouth Qam 13)  Metformin Hcl 500 Mg Xr24h-Tab (Metformin Hcl) .... 3 By Mouth Once Daily 14)  Lancets  Misc (Lancets) .... Use Asd 1 Once Daily 15)  Diltiazem Hcl Cr 240 Mg Xr24h-Cap (Diltiazem Hcl) .[name].. 1po Once Daily 16)  Hydralazine Hcl 50 Mg Tabs (Hydralazine Hcl) .[name].. 1 By Mouth Three Times A Day 17)  Alprazolam 0.5 Mg Tabs (Alprazolam) .... 1/2 - 1 By Mouth Once Daily As Needed 18)  Metronidazole 250 Mg Tabs (Metronidazole) .[name].. 1po Four Times Per Day 19)  Tramadol Hcl 50 Mg Tabs (Tramadol Hcl) .[name].. 1 - 2 By Mouth Q 6 Hrs As Needed Pain 20)  Ciprofloxacin Hcl 500 Mg Tabs (Ciprofloxacin Hcl) .[name].. 1 By Mouth Two Times A Day  Current Medications (verified): 1)  Allopurinol 300 Mg Tabs (Allopurinol) .... Take 1 Tablet By Mouth Once A Day 2)  Cialis 20 Mg Tabs (Tadalafil) .... Take 1 Tablet By Mouth Once A Day Prn 3)  Prilosec Otc 20 Mg Tbec (Omeprazole Magnesium) .... Take 1 Tablet By Mouth Once A Day 4)  Micardis 80 Mg Tabs (Telmisartan) .[name].. 1po Once Daily 5)  Furosemide 40 Mg  Tabs (Furosemide) .[name].. 1 By Mouth Once Daily 6)  Klor-Con 10 10 Meq  Tbcr (Potassium Chloride) .... 3 By Mouth Once Daily 7)  Bystolic 20 Mg Tabs (Nebivolol Hcl) .[name].. 1 By Mouth  Once Daily 8)  Adult Aspirin Ec Low Strength 81 Mg  Tbec (Aspirin) .[name].. 1 By Mouth Once Daily 9)  Glimepiride 4 Mg Tabs (Glimepiride) .... Take 2 By Mouth Qam 10)  Metformin Hcl 500 Mg Xr24h-Tab (Metformin Hcl) .... 3 By Mouth Once Daily 11)  Lancets  Misc (Lancets) .... Use Asd 1 Once Daily 12)  Diltiazem Hcl Cr 240 Mg Xr24h-Cap (Diltiazem Hcl) .[name].. 1po Once Daily 13)  Hydralazine Hcl 50 Mg Tabs (Hydralazine Hcl) .[name].. 1 By Mouth Three Times A Day 14)  Alprazolam 0.5 Mg Tabs (Alprazolam) .... 1/2 - 1 By Mouth Once Daily As Needed 15)  Tramadol Hcl 50  Mg Tabs (Tramadol Hcl) .[name].. 1 - 2 By Mouth Q 6 Hrs As Needed Pain 16)  Nitrofurantoin Macrocrystal 100 Mg Caps (Nitrofurantoin Macrocrystal) .[name].. 1po Qid 17)  Tamsulosin Hcl 0.4 Mg Caps (Tamsulosin Hcl) .[name].. 1po Once Daily  Allergies (verified): 1)  ! Accupril 2)  ! 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 Hyperlipidemia Anxiety Allergic rhinitis IBS Depression recurrent peptic stricture hx of shingles chronic mild elev LFT's  Review of Systems       all otherwise negative per pt -    Physical Exam  General:  alert and overweight-appearing, mild ill  Head:  normocephalic and atraumatic.   Eyes:  vision grossly intact, pupils equal, and pupils round.   Ears:  R ear normal and L ear normal.   Nose:  no external deformity and no nasal discharge.   Mouth:  no gingival abnormalities and pharynx pink and moist.   Neck:  supple and no masses.   Lungs:  normal respiratory effort and normal breath sounds.   Heart:  normal rate and regular rhythm.   Abdomen:  soft and normal bowel sounds.  , with mild to mod low mid abd tender,  no guarding or rebound Msk:  no flank tender bilat Extremities:  no edema, no erythema  Neurologic:  strength normal in all extremities and gait normal.     Impression & Recommendations:  Problem # 1:  DYSURIA (ICD-788.1) Assessment Deteriorated  The following medications were removed from the medication list:    Metronidazole 250 Mg Tabs (Metronidazole) .[name]... 1po four times per day    Ciprofloxacin Hcl 500 Mg Tabs (Ciprofloxacin hcl)  .[name]... 1 by mouth two times a day His updated medication list for this problem includes:    Nitrofurantoin Macrocrystal 100 Mg Caps (Nitrofurantoin macrocrystal) .[name]... 1po qid  Orders: T-Culture, Urine ([id number]) TLB-Udip w/ Micro (81001-URINE) prob uti - to check urines studies, treat as above, f/u any worsening signs or symptoms   Problem # 2:  URINARY HESITANCY (LKG-401.02) Assessment: Deteriorated liekly related to above; for tamsulosin with hx of BPH;  has f/u appt wtih Dr Patsi Sears Jan 20  Problem # 3:  HYPERTENSION (ICD-401.9) Assessment: Deteriorated  His updated medication list for this problem includes:    Micardis 80 Mg Tabs (Telmisartan) .[name]... 1po once daily    Furosemide 40 Mg Tabs (Furosemide) .[name]... 1 by mouth once daily    Bystolic 20 Mg Tabs (Nebivolol hcl) .[name]... 1 by mouth  once daily    Diltiazem Hcl Cr 240 Mg Xr24h-cap (Diltiazem hcl) .[name]... 1po once daily    Hydralazine Hcl 50 Mg Tabs (Hydralazine hcl) .[name]... 1 by mouth three times a day  BP today: 142/82 Prior BP: 100/68 (04/07/2010)  Labs Reviewed: K+: 3.9 (04/03/2010) Creat: : 1.1 (04/03/2010)   Chol: 100 (04/03/2010)   HDL: 29.50 (04/03/2010)   LDL: 32 (04/03/2010)   TG: 192.0 (04/03/2010) mild elev today, likely situational, ok to follow, continue same treatment   Problem # 4:  DIABETES MELLITUS, TYPE II (ICD-250.00) Assessment: Unchanged  His updated medication list for this problem includes:    Micardis 80 Mg Tabs (Telmisartan) .[name]... 1po once daily    Adult Aspirin Ec Low Strength 81 Mg Tbec (Aspirin) .[name]... 1 by mouth once daily    Glimepiride 4 Mg Tabs (Glimepiride) .[name]... Take 2 by mouth qam    Metformin Hcl 500 Mg Xr24h-tab (Metformin hcl) .Marland KitchenMarland KitchenMarland KitchenMarland Kitchen 3 by mouth once daily  Labs Reviewed: Creat: 1.1 (04/03/2010)    Reviewed HgBA1c results: 6.0 (04/03/2010)  6.0 (09/29/2009) stable overall by hx and exam, ok  to continue meds/tx as is , Pt to cont DM diet, excercise, wt control efforts  Complete  Medication List: 1)  Allopurinol 300 Mg Tabs (Allopurinol) .... Take 1 tablet by mouth once a day 2)  Cialis 20 Mg Tabs (Tadalafil) .... Take 1 tablet by mouth once a day prn 3)  Prilosec Otc 20 Mg Tbec (Omeprazole magnesium) .... Take 1 tablet by mouth once a day 4)  Micardis 80 Mg Tabs (Telmisartan) .[name].. 1po once daily 5)  Furosemide 40 Mg Tabs (Furosemide) .[name].. 1 by mouth once daily 6)  Klor-con 10 10 Meq Tbcr (Potassium chloride) .... 3 by mouth once daily 7)  Bystolic 20 Mg Tabs (Nebivolol hcl) .[name].. 1 by mouth  once daily 8)  Adult Aspirin Ec Low Strength 81 Mg Tbec (Aspirin) .[name].. 1 by mouth once daily 9)  Glimepiride 4 Mg Tabs (Glimepiride) .... Take 2 by mouth qam 10)  Metformin Hcl 500 Mg Xr24h-tab (Metformin hcl) .... 3 by mouth once daily 11)  Lancets Misc (Lancets) .... Use asd 1 once daily 12)  Diltiazem Hcl Cr 240 Mg Xr24h-cap (Diltiazem hcl) .[name].. 1po once daily 13)  Hydralazine Hcl 50 Mg Tabs (Hydralazine hcl) .[name].. 1 by mouth three times a day 14)  Alprazolam 0.5 Mg Tabs (Alprazolam) .... 1/2 - 1 by mouth once daily as needed 15)  Tramadol Hcl 50 Mg Tabs (Tramadol hcl) .[name].. 1 - 2 by mouth q 6 hrs as needed pain 16)  Nitrofurantoin Macrocrystal 100 Mg Caps (Nitrofurantoin macrocrystal) .[name].. 1po qid 17)  Tamsulosin Hcl 0.4 Mg Caps (Tamsulosin hcl) .[name].. 1po once daily  Patient Instructions: 1)  Please take all new medications as prescribed - the antibiotic and the prostate medication 2)  Continue all previous medications as before this visit  3)  Please go to the Lab in the basement for your urine tests today  4)  Please keep your appt with urology in Jan 20 as planned 5)  Please schedule a follow-up appointment in 5 months as planned already Prescriptions: TAMSULOSIN HCL 0.4 MG CAPS (TAMSULOSIN HCL) 1po once daily  #30 x 11   Entered and Authorized by:   Corwin Levins MD   Signed by:   Corwin Levins MD on 05/02/2010   Method used:   Print then Give to Patient   RxID:    [id number] NITROFURANTOIN MACROCRYSTAL 100 MG CAPS (NITROFURANTOIN MACROCRYSTAL) 1po qid  #40 x 0   Entered and Authorized by:   Corwin Levins MD   Signed by:   Corwin Levins MD on 05/02/2010   Method used:   Print then Give to Patient   RxID:   [id number]    Orders Added: 1)  T-Culture, Urine [[id number]] 2)  TLB-Udip w/ Micro [81001-URINE] 3)  Est. Patient Level IV [01027]

## 2010-06-22 NOTE — Assessment & Plan Note (Signed)
Summary: ??uti/cd   Vital Signs:  Patient profile:   60 year old male Height:      68 inches Weight:      222.13 pounds BMI:     33.90 O2 Sat:      97 % on Room air Temp:     97.9 degrees F oral Pulse rate:   55 / minute BP sitting:   144 / 94  (left arm) Cuff size:   small  Vitals Entered By: Zella Ball Ewing CMA (AAMA) (May 23, 2010 2:31 PM)  O2 Flow:  Room air CC: Urine frequency and back pain/RE   CC:  Urine frequency and back pain/RE.  History of Present Illness: here for f/u, unfortunetly with recurrence of dysuria , mild to mod for 3-4 days, assoc with lower abd discomfort, but no back or flank pain, n/v, high fever or chills;  seemed to do quite nicely after antibix for only 4-5 days, then seemed to re-start symtpoms again as before;  had CT abd recently, as well as ACBE neg as well - no contrast in the bladder.  Denies other significant prostatism symptoms,;  does have f/u appt with urology soon;  Pt denies CP, worsening sob, doe, wheezing, orthopnea, pnd, worsening LE edema, palps, dizziness or syncope  Pt denies new neuro symptoms such as headache, facial or extremity weakness  Pt denies polydipsia, polyuria, or low sugar symptoms such as shakiness improved with eating.  Overall good compliance with meds, trying to follow low chol, DM diet, wt stable, little excercise however  Denies worsening depressive symptoms, suicidal ideation, or panic.    Problems Prior to Update: 1)  Urinary Hesitancy  (ICD-788.64) 2)  Dysuria  (ICD-788.1) 3)  Joint Effusion, Right Knee  (ICD-719.06) 4)  Preoperative Examination  (ICD-V72.84) 5)  Diverticulitis, Acute  (ICD-562.11) 6)  Diverticulitis, Acute  (ICD-562.11) 7)  Diarrhea of Presumed Infectious Origin  (ICD-009.3) 8)  Abdominal Pain, Unspecified Site  (ICD-789.00) 9)  Peripheral Edema  (ICD-782.3) 10)  Unspecified Disorder of Kidney and Ureter  (ICD-593.9) 11)  Plantar Fasciitis, Right  (ICD-728.71) 12)  Rash-nonvesicular   (ICD-782.1) 13)  Transaminases, Serum, Elevated  (ICD-790.4) 14)  Other Specified Forms of Hearing Loss  (ICD-389.8) 15)  Hypokalemia  (ICD-276.8) 16)  Plantar Wart  (ICD-078.12) 17)  Electrocardiogram, Abnormal  (ICD-794.31) 18)  Preventive Health Care  (ICD-V70.0) 19)  Sebaceous Cyst, Infected  (ICD-706.2) 20)  Depression  (ICD-311) 21)  Ibs  (ICD-564.1) 22)  Allergic Rhinitis  (ICD-477.9) 23)  Anxiety  (ICD-300.00) 24)  Hyperlipidemia  (ICD-272.4) 25)  Gout  (ICD-274.9) 26)  Gerd  (ICD-530.81) 27)  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 Take 1 Tablet By Mouth Once A Day 2)  Cialis 20 Mg Tabs (Tadalafil) .... Take 1 Tablet By Mouth Once A Day Prn 3)  Prilosec Otc 20 Mg Tbec (Omeprazole Magnesium) .... Take 1 Tablet By Mouth Once A Day 4)  Micardis 80 Mg Tabs (Telmisartan) .[name].. 1po Once Daily 5)  Furosemide 40 Mg  Tabs (Furosemide) .[name].. 1 By Mouth Once Daily 6)  Klor-Con 10 10 Meq  Tbcr (Potassium Chloride) .... 3 By Mouth Once Daily 7)  Bystolic 20 Mg Tabs (Nebivolol Hcl) .[name].. 1 By Mouth  Once Daily 8)  Adult Aspirin Ec Low Strength  81 Mg  Tbec (Aspirin) .[name].. 1 By Mouth Once Daily 9)  Glimepiride 4 Mg Tabs (Glimepiride) .... Take 2 By Mouth Qam 10)  Metformin Hcl 500 Mg Xr24h-Tab (Metformin Hcl) .... 3 By Mouth Once Daily 11)  Lancets  Misc (Lancets) .... Use Asd 1 Once Daily 12)  Diltiazem Hcl Cr 240 Mg Xr24h-Cap (Diltiazem Hcl) .[name].. 1po Once Daily 13)  Hydralazine Hcl 50 Mg Tabs (Hydralazine Hcl) .[name].. 1 By Mouth Three Times A Day 14)  Alprazolam 0.5 Mg Tabs (Alprazolam) .... 1/2 - 1 By Mouth Once Daily As Needed 15)  Tramadol Hcl 50 Mg Tabs (Tramadol Hcl) .[name].. 1 - 2 By Mouth Q 6 Hrs As Needed Pain 16)  Nitrofurantoin Macrocrystal 100 Mg Caps  (Nitrofurantoin Macrocrystal) .[name].. 1po Qid 17)  Tamsulosin Hcl 0.4 Mg Caps (Tamsulosin Hcl) .[name].. 1po Once Daily  Current Medications (verified): 1)  Allopurinol 300 Mg Tabs (Allopurinol) .... Take 1 Tablet By Mouth Once A Day 2)  Cialis 20 Mg Tabs (Tadalafil) .... Take 1 Tablet By Mouth Once A Day Prn 3)  Prilosec Otc 20 Mg Tbec (Omeprazole Magnesium) .... Take 1 Tablet By Mouth Once A Day 4)  Micardis 80 Mg Tabs (Telmisartan) .[name].. 1po Once Daily 5)  Furosemide 40 Mg  Tabs (Furosemide) .[name].. 1 By Mouth Once Daily 6)  Klor-Con 10 10 Meq  Tbcr (Potassium Chloride) .... 3 By Mouth Once Daily 7)  Bystolic 20 Mg Tabs (Nebivolol Hcl) .[name].. 1 By Mouth  Once Daily 8)  Adult Aspirin Ec Low Strength 81 Mg  Tbec (Aspirin) .[name].. 1 By Mouth Once Daily 9)  Glimepiride 4 Mg Tabs (Glimepiride) .... Take 2 By Mouth Qam 10)  Metformin Hcl 500 Mg Xr24h-Tab (Metformin Hcl) .... 3 By Mouth Once Daily 11)  Lancets  Misc (Lancets) .... Use Asd 1 Once Daily 12)  Diltiazem Hcl Cr 240 Mg Xr24h-Cap (Diltiazem Hcl) .[name].. 1po Once Daily 13)  Hydralazine Hcl 50 Mg Tabs (Hydralazine Hcl) .[name].. 1 By Mouth Three Times A Day 14)  Alprazolam 0.5 Mg Tabs (Alprazolam) .... 1/2 - 1 By Mouth Once Daily As Needed 15)  Tramadol Hcl 50 Mg Tabs (Tramadol Hcl) .[name].. 1 - 2 By Mouth Q 6 Hrs As Needed Pain 16)  Sulfamethoxazole-Tmp Ds 800-160 Mg Tabs (Sulfamethoxazole-Trimethoprim) .[name].. 1po Two Times A Day 17)  Tamsulosin Hcl 0.4 Mg Caps (Tamsulosin Hcl) .[name].. 1po Once Daily  Allergies (verified): 1)  ! Accupril 2)  ! * Lexapro 3)  * Glimeparide  Past History:  Past Medical History: Last updated: 05/02/2010 Colonic polyps, hx of Diabetes mellitus, type II Hypertension GERD insomnia Gout E.D. 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   Eyes:  vision grossly intact, pupils equal, and pupils round.   Ears:  R ear normal and L ear normal.   Nose:  no external deformity and no nasal discharge.   Mouth:  no gingival abnormalities and pharynx pink and moist.   Neck:  supple and no masses.  Lungs:  normal respiratory effort and normal breath sounds.   Heart:  normal rate and regular rhythm.   Abdomen:  soft and normal bowel sounds.  , with mild to mod low mid abd tender,  no guarding or rebound Msk:  no flank tender bilat Extremities:  no edema, no erythema    Impression & Recommendations:  Problem # 1:  DYSURIA (ICD-788.1)  His updated medication list for this problem includes:    Sulfamethoxazole-tmp Ds 800-160 Mg Tabs (Sulfamethoxazole-trimethoprim) .[name]... 1po two times a day with recurrent symptoms,  no prostatism symptoms, recent CT sept 2011 neg for stone, and recent ACBE neg as well - to chekc urine studies repeat, and treat as above, f/u any worsening signs or symptoms , has f/u with Dr Patsi Sears jan 20  Orders: T-Culture, Urine ([id number]) TLB-Udip w/ Micro (81001-URINE)  Problem # 2:  HYPERTENSION (ICD-401.9)  His updated medication list for this problem includes:    Micardis 80 Mg Tabs (Telmisartan) .[name]... 1po once daily    Furosemide 40 Mg Tabs (Furosemide) .[name]... 1 by mouth once daily    Bystolic 20 Mg Tabs (Nebivolol hcl) .[name]... 1 by mouth  once daily    Diltiazem Hcl Cr 240 Mg Xr24h-cap (Diltiazem hcl) .[name]... 1po once daily    Hydralazine Hcl 50 Mg Tabs (Hydralazine hcl) .[name]... 1 by mouth three times a day  BP today: 144/94 Prior BP:  142/82 (05/02/2010)  Labs Reviewed: K+: 3.9 (04/03/2010) Creat: : 1.1 (04/03/2010)   Chol: 100 (04/03/2010)   HDL: 29.50 (04/03/2010)   LDL: 32 (04/03/2010)   TG: 192.0 (04/03/2010) mild elev today, likely situational, ok to follow, continue same treatment   Problem # 3:  DIABETES MELLITUS, TYPE II (ICD-250.00)  His updated medication list for this problem includes:    Micardis 80 Mg Tabs (Telmisartan) .[name]... 1po once daily    Adult Aspirin Ec Low Strength 81 Mg Tbec (Aspirin) .[name]... 1 by mouth once daily    Glimepiride 4 Mg Tabs (Glimepiride) .[name]... Take 2 by mouth qam    Metformin Hcl 500 Mg Xr24h-tab (Metformin hcl) .Marland KitchenMarland KitchenMarland KitchenMarland Kitchen 3 by mouth once daily  Labs Reviewed: Creat: 1.1 (04/03/2010)    Reviewed HgBA1c results: 6.0 (04/03/2010)  6.0 (09/29/2009) stable overall by hx and exam, ok to continue meds/tx as is   Problem # 4:  ANXIETY (ICD-300.00)  His updated medication list for this problem includes:    Alprazolam 0.5 Mg Tabs (Alprazolam) .[name]... 1/2 - 1 by mouth once daily as needed stable overall by hx and exam, ok to continue meds/tx as is   Complete Medication List: 1)  Allopurinol 300 Mg Tabs (Allopurinol) .... Take 1 tablet by mouth once a day 2)  Cialis 20 Mg Tabs (Tadalafil) .... Take 1 tablet by mouth once a day prn 3)  Prilosec Otc 20 Mg Tbec (Omeprazole magnesium) .... Take 1 tablet by mouth once a day 4)  Micardis 80 Mg Tabs (Telmisartan) .[name].. 1po once daily 5)  Furosemide 40 Mg Tabs (Furosemide) .[name].. 1 by mouth once daily 6)  Klor-con 10 10 Meq Tbcr (Potassium chloride) .... 3 by mouth once daily 7)  Bystolic 20 Mg Tabs (Nebivolol hcl) .[name].. 1 by mouth  once daily 8)  Adult Aspirin Ec Low Strength 81 Mg Tbec (Aspirin) .[name].. 1 by mouth once daily 9)  Glimepiride 4 Mg Tabs (Glimepiride) .... Take 2 by mouth qam 10)  Metformin Hcl 500 Mg Xr24h-tab (Metformin hcl) .... 3 by mouth once daily 11)  Lancets Misc (  Lancets) .... Use asd 1 once daily 12)  Diltiazem Hcl Cr 240 Mg  Xr24h-cap (Diltiazem hcl) .[name].. 1po once daily 13)  Hydralazine Hcl 50 Mg Tabs (Hydralazine hcl) .[name].. 1 by mouth three times a day 14)  Alprazolam 0.5 Mg Tabs (Alprazolam) .... 1/2 - 1 by mouth once daily as needed 15)  Tramadol Hcl 50 Mg Tabs (Tramadol hcl) .[name].. 1 - 2 by mouth q 6 hrs as needed pain 16)  Sulfamethoxazole-tmp Ds 800-160 Mg Tabs (Sulfamethoxazole-trimethoprim) .[name].. 1po two times a day 17)  Tamsulosin Hcl 0.4 Mg Caps (Tamsulosin hcl) .[name].. 1po once daily  Patient Instructions: 1)  Please go to the Lab in the basement for your blood and/or urine tests today 2)  Please take all new medications as prescribed 3)  Continue all previous medications as before this visit  4)  Please keep your appt on Jan 20 with Dr Tannenbauem/urology 5)  Please schedule a follow-up appointment in 5 months with: 6)  BMP prior to visit, ICD-9: 250.02 7)  Lipid Panel prior to visit, ICD-9: 8)  HbgA1C prior to visit, ICD-9: Prescriptions: SULFAMETHOXAZOLE-TMP DS 800-160 MG TABS (SULFAMETHOXAZOLE-TRIMETHOPRIM) 1po two times a day  #28 x 0   Entered and Authorized by:   Corwin Levins MD   Signed by:   Corwin Levins MD on 05/23/2010   Method used:   Print then Give to Patient   RxID:   [id number]    Orders Added: 1)  T-Culture, Urine [[id number]] 2)  TLB-Udip w/ Micro [81001-URINE] 3)  Est. Patient Level IV [06301]

## 2010-06-28 NOTE — Assessment & Plan Note (Signed)
Summary: ?uti/cd   Vital Signs:  Patient profile:   60 year old male Height:      68 inches Weight:      227 pounds BMI:     34.64 O2 Sat:      95 % on Room air Temp:     98.7 degrees F oral Pulse rate:   58 / minute BP sitting:   142 / 80  (left arm) Cuff size:   small  Vitals Entered By: Zella Ball Ewing CMA (AAMA) (June 19, 2010 3:25 PM)  O2 Flow:  Room air CC: Refill antibiotic for UTI, discuss recent test/RE   CC:  Refill antibiotic for UTI and discuss recent test/RE.  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  Had 2 days "leftover" septra antibx at home and took those, feels better, but now ran out.   Pt denies CP, worsening sob, doe, wheezing, orthopnea, pnd, worsening LE edema, palps, dizziness or syncope  Pt denies new neuro symptoms such as headache, facial or extremity weakness  Pt denies polydipsia, polyuria   Overall good compliance with meds, trying to follow low chol diet, wt stable, little excercise however  .  Overall good compliance with meds, and good tolerability.   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) 3)  Urinary Hesitancy  (ICD-788.64) 4)  Dysuria  (ICD-788.1) 5)  Joint Effusion, Right Knee  (ICD-719.06) 6)  Preoperative Examination  (ICD-V72.84) 7)  Diverticulitis, Acute  (ICD-562.11) 8)  Diverticulitis, Acute  (ICD-562.11) 9)  Diarrhea of Presumed Infectious Origin  (ICD-009.3) 10)  Abdominal Pain, Unspecified Site  (ICD-789.00) 11)  Peripheral Edema  (ICD-782.3) 12)  Unspecified Disorder of Kidney and Ureter  (ICD-593.9) 13)  Plantar Fasciitis, Right  (ICD-728.71) 14)  Rash-nonvesicular   (ICD-782.1) 15)  Transaminases, Serum, Elevated  (ICD-790.4) 16)  Other Specified Forms of Hearing Loss  (ICD-389.8) 17)  Hypokalemia  (ICD-276.8) 18)  Plantar Wart  (ICD-078.12) 19)  Electrocardiogram, Abnormal  (ICD-794.31) 20)  Preventive Health Care  (ICD-V70.0) 21)  Sebaceous Cyst, Infected  (ICD-706.2) 22)  Depression  (ICD-311) 23)  Ibs  (ICD-564.1) 24)  Allergic Rhinitis  (ICD-477.9) 25)  Anxiety  (ICD-300.00) 26)  Hyperlipidemia  (ICD-272.4) 27)  Gout  (ICD-274.9) 28)  Gerd  (ICD-530.81) 29)  Rash-nonvesicular  (ICD-782.1) 30)  Hypertension  (ICD-401.9) 31)  Diabetes Mellitus, Type II  (ICD-250.00) 32)  Sinusitis- Acute-nos  (ICD-461.9) 33)  Colonic Polyps, Hx of  (ICD-V12.72)  Medications Prior to Update: 1)  Allopurinol 300 Mg Tabs (Allopurinol) .... Take 1 Tablet By Mouth Once A Day 2)  Cialis 20 Mg Tabs (Tadalafil) .... Take 1 Tablet By Mouth Once A Day Prn 3)  Prilosec Otc 20 Mg Tbec (Omeprazole Magnesium) .... Take 1 Tablet By Mouth Once A Day 4)  Micardis 80 Mg Tabs (Telmisartan) .[name].. 1po Once Daily 5)  Furosemide 40 Mg  Tabs (Furosemide) .[name].. 1 By Mouth Once Daily 6)  Klor-Con 10 10 Meq  Tbcr (Potassium Chloride) .... 3 By Mouth Once Daily 7)  Bystolic 20 Mg Tabs (Nebivolol Hcl) .[name].. 1 By Mouth  Once Daily 8)  Adult Aspirin  Ec Low Strength 81 Mg  Tbec (Aspirin) .[name].. 1 By Mouth Once Daily 9)  Glimepiride 4 Mg Tabs (Glimepiride) .... Take 2 By Mouth Qam 10)  Metformin Hcl 500 Mg Xr24h-Tab (Metformin Hcl) .... 3 By Mouth Once Daily 11)  Lancets  Misc (Lancets) .... Use Asd 1 Once Daily 12)  Diltiazem Hcl Cr 240 Mg Xr24h-Cap (Diltiazem Hcl) .[name].. 1po Once Daily 13)  Hydralazine Hcl 50 Mg Tabs (Hydralazine Hcl) .[name].. 1 By Mouth Three Times A Day 14)  Alprazolam 0.5 Mg Tabs (Alprazolam) .... 1/2 - 1 By Mouth Once Daily As Needed 15)  Tramadol Hcl 50 Mg Tabs (Tramadol Hcl) .[name].. 1 - 2 By Mouth Q 6 Hrs As Needed Pain 16)  Sulfamethoxazole-Tmp Ds 800-160 Mg Tabs  (Sulfamethoxazole-Trimethoprim) .[name].. 1po Two Times A Day 17)  Tamsulosin Hcl 0.4 Mg Caps (Tamsulosin Hcl) .[name].. 1po Once Daily  Current Medications (verified): 1)  Allopurinol 300 Mg Tabs (Allopurinol) .... Take 1 Tablet By Mouth Once A Day 2)  Cialis 20 Mg Tabs (Tadalafil) .... Take 1 Tablet By Mouth Once A Day Prn 3)  Prilosec Otc 20 Mg Tbec (Omeprazole Magnesium) .... Take 1 Tablet By Mouth Once A Day 4)  Micardis 80 Mg Tabs (Telmisartan) .[name].. 1po Once Daily 5)  Furosemide 40 Mg  Tabs (Furosemide) .[name].. 1 By Mouth Once Daily 6)  Klor-Con 10 10 Meq  Tbcr (Potassium Chloride) .... 3 By Mouth Once Daily 7)  Bystolic 20 Mg Tabs (Nebivolol Hcl) .[name].. 1 By Mouth  Once Daily 8)  Adult Aspirin Ec Low Strength 81 Mg  Tbec (Aspirin) .[name].. 1 By Mouth Once Daily 9)  Glimepiride 4 Mg Tabs (Glimepiride) .... Take 2 By Mouth Qam 10)  Metformin Hcl 500 Mg Xr24h-Tab (Metformin Hcl) .... 3 By Mouth Once Daily 11)  Lancets  Misc (Lancets) .... Use Asd 1 Once Daily 12)  Diltiazem Hcl Cr 240 Mg Xr24h-Cap (Diltiazem Hcl) .[name].. 1po Once Daily 13)  Hydralazine Hcl 50 Mg Tabs (Hydralazine Hcl) .[name].. 1 By Mouth Three Times A Day 14)  Alprazolam 0.5 Mg Tabs (Alprazolam) .... 1/2 - 1 By Mouth Once Daily As Needed 15)  Tramadol Hcl 50 Mg Tabs (Tramadol Hcl) .[name].. 1 - 2 By Mouth Q 6 Hrs As Needed Pain 16)  Nitrofurantoin Macrocrystal 100 Mg Caps (Nitrofurantoin Macrocrystal) .[name].. 1po Two Times A Day 17)  Tamsulosin Hcl 0.4 Mg Caps (Tamsulosin Hcl) .[name].. 1po Once Daily  Allergies (verified): 1)  ! Accupril 2)  ! * Lexapro 3)  * Glimeparide  Past History:  Past Medical History: Last updated: 05/02/2010 Colonic polyps, hx of Diabetes mellitus, type II Hypertension GERD insomnia Gout E.D. 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   Eyes:  vision grossly intact, pupils equal, and pupils round.   Ears:  R ear normal and L ear normal.   Nose:  no external deformity and no nasal discharge.   Mouth:  no gingival abnormalities and pharynx pink and moist.   Neck:  supple and no masses.   Lungs:  normal respiratory effort and normal breath sounds.   Heart:  normal rate and regular rhythm.   Abdomen:  soft and normal bowel sounds.  , with mild to mod low mid abd tender,  no guarding or rebound Msk:  no flank tender bilat Extremities:  no edema, no erythema    Impression & Recommendations:  Problem # 1:  UTI (ICD-599.0)  His updated medication list for this problem includes:    Nitrofurantoin Macrocrystal 100 Mg Caps (Nitrofurantoin macrocrystal) .[name]... 1po two times a day took 2 days "leftover" sulfa med, now cleared, ok for nitrofurantoin tx as per emr, f/u with surg asplanned for the colovesicle fistula  Orders: T-Culture, Urine ([id number]) TLB-Udip w/ Micro (81001-URINE)  Problem # 2:  HYPERTENSION (ICD-401.9)  His updated medication list for this problem includes:    Micardis 80 Mg Tabs (Telmisartan) .[name]... 1po once daily    Furosemide 40 Mg Tabs (Furosemide) .[name]... 1 by mouth once daily    Bystolic 20 Mg Tabs (Nebivolol hcl) .[name]... 1 by mouth  once daily    Diltiazem Hcl Cr 240 Mg Xr24h-cap (Diltiazem hcl) .[name]... 1po once daily    Hydralazine Hcl 50 Mg Tabs (Hydralazine hcl) .[name]... 1 by mouth three times a day mild elev today, likely situational, ok to follow, continue same treatment   BP today: 142/80 Prior BP: 144/94 (05/23/2010)  Labs  Reviewed: K+: 3.9 (04/03/2010) Creat: : 1.1 (04/03/2010)   Chol: 100 (04/03/2010)   HDL: 29.50 (04/03/2010)   LDL: 32 (04/03/2010)   TG: 192.0 (04/03/2010)  Problem # 3:  DIABETES MELLITUS, TYPE II (ICD-250.00)  His updated medication list for this problem includes:    Micardis 80 Mg Tabs (Telmisartan) .[name]... 1po once daily    Adult Aspirin Ec Low Strength 81 Mg Tbec (Aspirin) .[name]... 1 by mouth once daily    Glimepiride 4 Mg Tabs (Glimepiride) .[name]... 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 Take 1 tablet by mouth once a day 2)  Cialis 20 Mg Tabs (Tadalafil) .... Take 1 tablet by mouth once a day prn 3)  Prilosec Otc 20 Mg Tbec (Omeprazole magnesium) .... Take 1 tablet by mouth once a day 4)  Micardis 80 Mg Tabs (Telmisartan) .[name].. 1po once daily 5)  Furosemide 40 Mg Tabs (Furosemide) .[name].. 1 by mouth once daily 6)  Klor-con 10 10 Meq Tbcr (Potassium chloride) .... 3 by mouth once daily 7)  Bystolic 20 Mg Tabs (Nebivolol hcl) .[name].. 1 by mouth  once daily 8)  Adult Aspirin Ec Low Strength 81 Mg Tbec (Aspirin) .[name].. 1 by mouth once daily 9)  Glimepiride 4 Mg Tabs (Glimepiride) .... Take 2 by mouth qam 10)  Metformin Hcl 500 Mg Xr24h-tab (Metformin hcl) .... 3 by mouth once daily 11)  Lancets Misc (Lancets) .... Use asd 1 once daily 12)  Diltiazem Hcl Cr 240 Mg Xr24h-cap (Diltiazem hcl) .[name].. 1po once daily 13)  Hydralazine Hcl 50 Mg Tabs (Hydralazine hcl) .[name].. 1 by mouth three times a day 14)  Alprazolam 0.5 Mg  Tabs (Alprazolam) .... 1/2 - 1 by mouth once daily as needed 15)  Tramadol Hcl 50 Mg Tabs  (Tramadol hcl) .[name].. 1 - 2 by mouth q 6 hrs as needed pain 16)  Nitrofurantoin Macrocrystal 100 Mg Caps (Nitrofurantoin macrocrystal) .[name].. 1po two times a day 17)  Tamsulosin Hcl 0.4 Mg Caps (Tamsulosin hcl) .[name].. 1po once daily  Patient Instructions: 1)  Please take all new medications as prescribed 2)  Please go to the Lab in the basement for your blood and/or urine tests today 3)  Please go to the Lab in the basement for your urine tests today  4)  Please schedule a follow-up appointment as needed. 5)  Good Luch wtih Surgury Prescriptions: NITROFURANTOIN MACROCRYSTAL 100 MG CAPS (NITROFURANTOIN MACROCRYSTAL) 1po two times a day  #20 x 0   Entered and Authorized by:   Corwin Levins MD   Signed by:   Corwin Levins MD on 06/19/2010   Method used:   Electronically to        [facility]. [phone]* (retail)       [address]       [place], [place]  [address]       Ph: [phone]       Fax: [phone]   RxID:   [id number]    Orders Added: 1)  T-Culture, Urine [[id number]] 2)  TLB-Udip w/ Micro [81001-URINE] 3)  Est. Patient Level IV [75643]

## 2010-06-28 NOTE — Letter (Signed)
Summary: [facility]  [facility]   Imported By: Lennie Odor 06/20/2010 15:52:32  _____________________________________________________________________  External Attachment:    Type:   Image     Comment:   External Document

## 2010-07-06 ENCOUNTER — Telehealth: Payer: Self-pay | Admitting: Internal Medicine

## 2010-07-12 NOTE — Progress Notes (Signed)
Summary: ABX refill req  Phone Note Call from Patient Call back at Home Phone [phone]   Caller: Patient Summary of Call: Pt called stating that surgery has been resheduled to 03/23 from 02/12. Pt is requesting refill of Nitrofurantoin. Initial call taken by: Margaret Pyle, CMA,  July 06, 2010 10:45 AM  Follow-up for Phone Call        done per emr Follow-up by: Corwin Levins MD,  July 06, 2010 12:42 PM    Prescriptions: NITROFURANTOIN MACROCRYSTAL 100 MG CAPS (NITROFURANTOIN MACROCRYSTAL) 1po two times a day  #20 x 0   Entered and Authorized by:   Corwin Levins MD   Signed by:   Corwin Levins MD on 07/06/2010   Method used:   Electronically to        [facility]. [phone]* (retail)       [address]       [place], [place]  [address]       Ph: [phone]       Fax: [phone]   RxID:   [id number]   Appended Document: ABX refill req pt informed

## 2010-07-20 DIAGNOSIS — N321 Vesicointestinal fistula: Secondary | ICD-10-CM

## 2010-07-20 HISTORY — DX: Vesicointestinal fistula: N32.1

## 2010-07-24 ENCOUNTER — Telehealth: Payer: Self-pay | Admitting: Internal Medicine

## 2010-08-01 NOTE — Progress Notes (Signed)
Summary: Rx refill req  Phone Note Call from Patient Call back at Home Phone [phone]   Caller: Patient Summary of Call: Pt called requesting refill of Nitrofurantoin. Surgery is scheduled for 03/23 Initial call taken by: Margaret Pyle, CMA,  July 24, 2010 10:17 AM  Follow-up for Phone Call        done per emr Follow-up by: Corwin Levins MD,  July 24, 2010 12:12 PM  Additional Follow-up for Phone Call Additional follow up Details #1::        pt advised via VM Additional Follow-up by: Margaret Pyle, CMA,  July 24, 2010 1:40 PM    Prescriptions: NITROFURANTOIN MACROCRYSTAL 100 MG CAPS (NITROFURANTOIN MACROCRYSTAL) 1po two times a day  #20 x 0   Entered and Authorized by:   Corwin Levins MD   Signed by:   Corwin Levins MD on 07/24/2010   Method used:   Electronically to        [facility]. [phone]* (retail)       [address]       [place], [place]  [address]       Ph: [phone]       Fax: [phone]   RxID:   [id number]

## 2010-08-04 ENCOUNTER — Other Ambulatory Visit: Payer: Self-pay | Admitting: General Surgery

## 2010-08-04 ENCOUNTER — Ambulatory Visit (HOSPITAL_COMMUNITY)
Admission: RE | Admit: 2010-08-04 | Discharge: 2010-08-04 | Disposition: A | Discharge status: Home / Self Care | Payer: BC Managed Care – PPO | Source: Ambulatory Visit | Attending: General Surgery | Admitting: General Surgery

## 2010-08-04 ENCOUNTER — Other Ambulatory Visit (HOSPITAL_COMMUNITY): Payer: Self-pay | Admitting: General Surgery

## 2010-08-04 ENCOUNTER — Telehealth (INDEPENDENT_AMBULATORY_CARE_PROVIDER_SITE_OTHER): Payer: Self-pay | Admitting: *Deleted

## 2010-08-04 ENCOUNTER — Encounter (HOSPITAL_COMMUNITY): Payer: BC Managed Care – PPO

## 2010-08-04 DIAGNOSIS — I10 Essential (primary) hypertension: Secondary | ICD-10-CM

## 2010-08-04 DIAGNOSIS — Z01818 Encounter for other preprocedural examination: Secondary | ICD-10-CM | POA: Insufficient documentation

## 2010-08-04 DIAGNOSIS — N321 Vesicointestinal fistula: Secondary | ICD-10-CM | POA: Insufficient documentation

## 2010-08-04 DIAGNOSIS — Z01812 Encounter for preprocedural laboratory examination: Secondary | ICD-10-CM | POA: Insufficient documentation

## 2010-08-04 DIAGNOSIS — K5732 Diverticulitis of large intestine without perforation or abscess without bleeding: Secondary | ICD-10-CM | POA: Insufficient documentation

## 2010-08-04 LAB — CBC
HCT: 45.3 % (ref 39.0–52.0)
Hemoglobin: 15.2 g/dL (ref 13.0–17.0)
MCH: 30.6 pg (ref 26.0–34.0)
MCHC: 33.6 g/dL (ref 30.0–36.0)
MCV: 91.1 fL (ref 78.0–100.0)
Platelets: 165 10*3/uL (ref 150–400)
RBC: 4.97 MIL/uL (ref 4.22–5.81)
RDW: 13.4 % (ref 11.5–15.5)
WBC: 8.8 10*3/uL (ref 4.0–10.5)

## 2010-08-04 LAB — BASIC METABOLIC PANEL
BUN: 20 mg/dL (ref 6–23)
CO2: 28 mEq/L (ref 19–32)
Calcium: 9.2 mg/dL (ref 8.4–10.5)
Chloride: 106 mEq/L (ref 96–112)
Creatinine, Ser: 1.09 mg/dL (ref 0.4–1.5)
GFR calc Af Amer: >60 mL/min (ref >60)
GFR calc non Af Amer: >60 mL/min (ref >60)
Glucose, Bld: 132 mg/dL — ABNORMAL HIGH (ref 70–99)
Potassium: 3.4 mEq/L — ABNORMAL LOW (ref 3.5–5.1)
Sodium: 142 mEq/L (ref 135–145)

## 2010-08-04 LAB — DIFFERENTIAL
Basophils Absolute: 0.1 10*3/uL (ref 0.0–0.1)
Basophils Relative: 1 % (ref 0–1)
Eosinophils Absolute: 0.2 10*3/uL (ref 0.0–0.7)
Eosinophils Relative: 3 % (ref 0–5)
Lymphocytes Relative: 28 % (ref 12–46)
Lymphs Abs: 2.5 10*3/uL (ref 0.7–4.0)
Monocytes Absolute: 0.9 10*3/uL (ref 0.1–1.0)
Monocytes Relative: 11 % (ref 3–12)
Neutro Abs: 5.1 10*3/uL (ref 1.7–7.7)
Neutrophils Relative %: 58 % (ref 43–77)

## 2010-08-04 LAB — SURGICAL PCR SCREEN
MRSA, PCR: NEGATIVE
Staphylococcus aureus: POSITIVE — AB

## 2010-08-08 LAB — GLUCOSE, CAPILLARY: Glucose-Capillary: 136 mg/dL — ABNORMAL HIGH (ref 70–99)

## 2010-08-08 NOTE — Progress Notes (Signed)
Summary: Records Request   Faxed EKG to [facility] ([phone]). Debby Freiberg  August 04, 2010 1:04 PM

## 2010-08-10 ENCOUNTER — Encounter: Payer: Self-pay | Admitting: Internal Medicine

## 2010-08-10 ENCOUNTER — Ambulatory Visit (INDEPENDENT_AMBULATORY_CARE_PROVIDER_SITE_OTHER): Payer: BC Managed Care – PPO | Admitting: Internal Medicine

## 2010-08-10 ENCOUNTER — Telehealth: Payer: Self-pay | Admitting: Internal Medicine

## 2010-08-10 VITALS — BP 160/94 | HR 56 | Temp 97.5°F | Ht 68.0 in | Wt 228.1 lb

## 2010-08-10 DIAGNOSIS — E119 Type 2 diabetes mellitus without complications: Secondary | ICD-10-CM

## 2010-08-10 DIAGNOSIS — I10 Essential (primary) hypertension: Secondary | ICD-10-CM

## 2010-08-10 DIAGNOSIS — F411 Generalized anxiety disorder: Secondary | ICD-10-CM

## 2010-08-10 DIAGNOSIS — N321 Vesicointestinal fistula: Secondary | ICD-10-CM

## 2010-08-10 MED ORDER — HYDRALAZINE HCL 50 MG PO TABS: 100.0000 mg | ORAL_TABLET | Freq: Three times a day (TID) | ORAL | Status: DC

## 2010-08-10 NOTE — Assessment & Plan Note (Signed)
.  stable overall by hx and exam, ok to continue meds/tx as is

## 2010-08-10 NOTE — Telephone Encounter (Signed)
Called Dr. Caralyn Guile' office and informed Germaine Dr. Caralyn Guile' RN of Dr. Jonny Ruiz ok for surgury instructions.

## 2010-08-10 NOTE — Assessment & Plan Note (Signed)
for preop assessment - ok for surgury in AM; Dr Carolynne Edouard office to be notified

## 2010-08-10 NOTE — Patient Instructions (Signed)
Increase the hydralazine to 50 mg - 2 tabs three times per day Continue all other medications as before You are OK to present for surgury in the AM We will let Dr Carolynne Edouard know - ok for surgury

## 2010-08-10 NOTE — Progress Notes (Signed)
Here to f/u as walkin pt  - went to [facility] last friday for preop colon/bladder surgury;  BP at that time 200/?? - pt asymptomatic and did not feel overly stressed except for elev BP frequently with BP taken at the hosp;  Has hx of severe elev BP last yr - w/u here with ekg/labs and f/u with dr Patsi Sears essentialy neg;  Feels some HA now - crown of head and forehad (?sinus) and ear fell full and some discomfort;  Here today after Dr Patsi Sears raised the ? Of BP  Elevation ,  And eventually referred here for BP check;  Per nurse her eBP 160/94;  Overall good compliance with treatment, and good medicine tolerability, though only very occasionally can miss the midday hydralazine (and has missed a few times this wk) .  Pt denies chest pain, increased sob or doe, wheezing, orthopnea, PND, increased LE swelling, palpitations, dizziness or syncope.    Pt denies new neurological symptoms such as facial or extremity weakness or numbness.   Pt denies polydipsia, polyuria, or low sugar symptoms such as weakness or confusion improved with po intake.  Pt states overall good compliance with meds, trying to follow lower cholesterol, diabetic diet, wt overall stable but little exercise however.     Is currently doing the go-lytely for tomorrow as well, with surgury planned for the AM.  Recent nitrofurnatoin and flomax stopped per urology.  Is on mult meds for BP, not yet taking his 3pm hydralazine today - was not planning to come here.  Denies worsening depressive symptoms, suicidal ideation, or panic, though has ongoing anxiety, not increased recently.    Review of Systems  Constitutional: Negative for diaphoresis and unexpected weight change.  HENT: Negative for drooling and tinnitus.   Eyes: Negative for photophobia and visual disturbance.  Respiratory: Negative for choking and stridor.   Gastrointestinal: Negative for vomiting and blood in stool.  Genitourinary: Negative for hematuria and decreased urine volume.    Musculoskeletal: Negative for gait problem.  Skin: Negative for color change and wound.  Neurological: Negative for tremors and numbness.  Psychiatric/Behavioral: Negative for decreased concentration. The patient is not hyperactive.    Physical Exam  Constitutional: Pt appears well-developed and well-nourished.  HENT: Head: Normocephalic.  Right Ear: External ear normal.  Left Ear: External ear normal.  Eyes: Conjunctivae and EOM are normal. Pupils are equal, round, and reactive to light.  Neck: Normal range of motion. Neck supple.  Cardiovascular: Normal rate and regular rhythm.   Pulmonary/Chest: Effort normal and breath sounds normal.  Abd:  Soft, NT, non-distended, + BS Neurological: Pt is alert. No cranial nerve deficit.  Skin: Skin is warm. No erythema.  Psychiatric: Pt behavior is normal. Thought content normal.   Past Medical History  Diagnosis Date  . HYPERTENSION 06/25/2007  . ALLERGIC RHINITIS 06/25/2007  . ANXIETY 06/25/2007  . COLONIC POLYPS, HX OF 06/25/2007  . DEPRESSION 06/25/2007  . DIABETES MELLITUS, TYPE II 06/25/2007  . Diarrhea of presumed infectious origin 02/14/2010  . DIVERTICULITIS, ACUTE 02/15/2010  . GERD 06/25/2007  . GOUT 06/25/2007  . HYPERLIPIDEMIA 06/25/2007  . IBS 06/25/2007  . 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 PERIPHERAL EDEMA 11/04/2009   Past Surgical History  Procedure Date  . Hernia repair   . Septoplasty     reports that he has never smoked. He does not have any smokeless tobacco history on file. He reports that he does not drink alcohol or use illicit drugs. family history includes Hypertension in  his maternal grandmother and paternal aunt and Stroke in his maternal grandmother. Allergies  Allergen Reactions  . Escitalopram Oxalate   . Glimepiride   . Quinapril Hcl     REACTION: rash

## 2010-08-10 NOTE — Telephone Encounter (Signed)
Pt seen with mild to mod elev BP;  essentialy asympt without signs of infection or other CV or other new significant symptoms;   Pt hydralazine increased to 100 mg tid  OK for surgury as planned in the AM  Robin to notify Dr Carolynne Edouard office

## 2010-08-10 NOTE — Assessment & Plan Note (Signed)
Uncontrolled - to increase the hydralazine to 100 tid, .Continue all other medications as before , f/u BP at home and next visit

## 2010-08-11 ENCOUNTER — Inpatient Hospital Stay (HOSPITAL_COMMUNITY)
Admission: RE | Admit: 2010-08-11 | Discharge: 2010-08-19 | DRG: 148 | Disposition: A | Discharge status: Home Health | Payer: BC Managed Care – PPO | Source: Ambulatory Visit | Attending: General Surgery | Admitting: General Surgery

## 2010-08-11 ENCOUNTER — Other Ambulatory Visit: Payer: Self-pay | Admitting: General Surgery

## 2010-08-11 DIAGNOSIS — E119 Type 2 diabetes mellitus without complications: Secondary | ICD-10-CM | POA: Diagnosis present

## 2010-08-11 DIAGNOSIS — I10 Essential (primary) hypertension: Secondary | ICD-10-CM | POA: Diagnosis present

## 2010-08-11 DIAGNOSIS — N321 Vesicointestinal fistula: Secondary | ICD-10-CM | POA: Diagnosis present

## 2010-08-11 DIAGNOSIS — K5732 Diverticulitis of large intestine without perforation or abscess without bleeding: Principal | ICD-10-CM | POA: Diagnosis present

## 2010-08-11 HISTORY — PX: COLON SURGERY: SHX602

## 2010-08-11 LAB — ABO/RH: ABO/RH(D): A NEG

## 2010-08-11 LAB — GLUCOSE, CAPILLARY
Glucose-Capillary: 159 mg/dL — ABNORMAL HIGH (ref 70–99)
Glucose-Capillary: 204 mg/dL — ABNORMAL HIGH (ref 70–99)
Glucose-Capillary: 184 mg/dL — ABNORMAL HIGH (ref 70–99)

## 2010-08-11 LAB — TYPE AND SCREEN
ABO/RH(D): A NEG
Antibody Screen: NEGATIVE

## 2010-08-12 LAB — HEMOGLOBIN A1C
Hgb A1c MFr Bld: 6.4 % — ABNORMAL HIGH (ref <5.7)
Mean Plasma Glucose: 137 mg/dL — ABNORMAL HIGH (ref <117)

## 2010-08-12 LAB — GLUCOSE, CAPILLARY
Glucose-Capillary: 121 mg/dL — ABNORMAL HIGH (ref 70–99)
Glucose-Capillary: 129 mg/dL — ABNORMAL HIGH (ref 70–99)
Glucose-Capillary: 137 mg/dL — ABNORMAL HIGH (ref 70–99)
Glucose-Capillary: 137 mg/dL — ABNORMAL HIGH (ref 70–99)
Glucose-Capillary: 139 mg/dL — ABNORMAL HIGH (ref 70–99)
Glucose-Capillary: 141 mg/dL — ABNORMAL HIGH (ref 70–99)
Glucose-Capillary: 145 mg/dL — ABNORMAL HIGH (ref 70–99)
Glucose-Capillary: 146 mg/dL — ABNORMAL HIGH (ref 70–99)

## 2010-08-12 LAB — DIFFERENTIAL
Basophils Absolute: 0 10*3/uL (ref 0.0–0.1)
Basophils Relative: 0 % (ref 0–1)
Eosinophils Absolute: 0 10*3/uL (ref 0.0–0.7)
Eosinophils Relative: 0 % (ref 0–5)
Lymphocytes Relative: 13 % (ref 12–46)
Lymphs Abs: 1.4 10*3/uL (ref 0.7–4.0)
Monocytes Absolute: 1.2 10*3/uL — ABNORMAL HIGH (ref 0.1–1.0)
Monocytes Relative: 11 % (ref 3–12)
Neutro Abs: 8.2 10*3/uL — ABNORMAL HIGH (ref 1.7–7.7)
Neutrophils Relative %: 76 % (ref 43–77)

## 2010-08-12 LAB — BASIC METABOLIC PANEL
BUN: 16 mg/dL (ref 6–23)
CO2: 27 mEq/L (ref 19–32)
Calcium: 8.2 mg/dL — ABNORMAL LOW (ref 8.4–10.5)
Chloride: 108 mEq/L (ref 96–112)
Creatinine, Ser: 1.3 mg/dL (ref 0.4–1.5)
GFR calc Af Amer: >60 mL/min (ref >60)
GFR calc non Af Amer: 56 mL/min — ABNORMAL LOW (ref >60)
Glucose, Bld: 142 mg/dL — ABNORMAL HIGH (ref 70–99)
Potassium: 3.8 mEq/L (ref 3.5–5.1)
Sodium: 142 mEq/L (ref 135–145)

## 2010-08-12 LAB — CBC
HCT: 40.9 % (ref 39.0–52.0)
Hemoglobin: 13.1 g/dL (ref 13.0–17.0)
MCH: 29.8 pg (ref 26.0–34.0)
MCHC: 32 g/dL (ref 30.0–36.0)
MCV: 93.2 fL (ref 78.0–100.0)
Platelets: 150 10*3/uL (ref 150–400)
RBC: 4.39 MIL/uL (ref 4.22–5.81)
RDW: 13.9 % (ref 11.5–15.5)
WBC: 10.8 10*3/uL — ABNORMAL HIGH (ref 4.0–10.5)

## 2010-08-13 LAB — GLUCOSE, CAPILLARY
Glucose-Capillary: 123 mg/dL — ABNORMAL HIGH (ref 70–99)
Glucose-Capillary: 132 mg/dL — ABNORMAL HIGH (ref 70–99)
Glucose-Capillary: 132 mg/dL — ABNORMAL HIGH (ref 70–99)
Glucose-Capillary: 134 mg/dL — ABNORMAL HIGH (ref 70–99)
Glucose-Capillary: 137 mg/dL — ABNORMAL HIGH (ref 70–99)
Glucose-Capillary: 177 mg/dL — ABNORMAL HIGH (ref 70–99)
Glucose-Capillary: 201 mg/dL — ABNORMAL HIGH (ref 70–99)

## 2010-08-13 LAB — CBC
HCT: 41.3 % (ref 39.0–52.0)
Hemoglobin: 13 g/dL (ref 13.0–17.0)
MCH: 29.7 pg (ref 26.0–34.0)
MCHC: 31.5 g/dL (ref 30.0–36.0)
MCV: 94.5 fL (ref 78.0–100.0)
Platelets: 149 10*3/uL — ABNORMAL LOW (ref 150–400)
RBC: 4.37 MIL/uL (ref 4.22–5.81)
RDW: 14.2 % (ref 11.5–15.5)
WBC: 13 10*3/uL — ABNORMAL HIGH (ref 4.0–10.5)

## 2010-08-13 LAB — DIFFERENTIAL
Basophils Absolute: 0 10*3/uL (ref 0.0–0.1)
Basophils Relative: 0 % (ref 0–1)
Eosinophils Absolute: 0 10*3/uL (ref 0.0–0.7)
Eosinophils Relative: 0 % (ref 0–5)
Lymphocytes Relative: 12 % (ref 12–46)
Lymphs Abs: 1.6 10*3/uL (ref 0.7–4.0)
Monocytes Absolute: 1.2 10*3/uL — ABNORMAL HIGH (ref 0.1–1.0)
Monocytes Relative: 9 % (ref 3–12)
Neutro Abs: 10.1 10*3/uL — ABNORMAL HIGH (ref 1.7–7.7)
Neutrophils Relative %: 78 % — ABNORMAL HIGH (ref 43–77)

## 2010-08-13 LAB — BASIC METABOLIC PANEL
BUN: 14 mg/dL (ref 6–23)
CO2: 29 mEq/L (ref 19–32)
Calcium: 8.7 mg/dL (ref 8.4–10.5)
Chloride: 107 mEq/L (ref 96–112)
Creatinine, Ser: 1.16 mg/dL (ref 0.4–1.5)
GFR calc Af Amer: >60 mL/min (ref >60)
GFR calc non Af Amer: >60 mL/min (ref >60)
Glucose, Bld: 145 mg/dL — ABNORMAL HIGH (ref 70–99)
Potassium: 3.4 mEq/L — ABNORMAL LOW (ref 3.5–5.1)
Sodium: 143 mEq/L (ref 135–145)

## 2010-08-14 LAB — HEMOGLOBIN: Hemoglobin: 13.4 g/dL (ref 13.0–17.0)

## 2010-08-14 LAB — GLUCOSE, CAPILLARY
Glucose-Capillary: 137 mg/dL — ABNORMAL HIGH (ref 70–99)
Glucose-Capillary: 117 mg/dL — ABNORMAL HIGH (ref 70–99)
Glucose-Capillary: 109 mg/dL — ABNORMAL HIGH (ref 70–99)
Glucose-Capillary: 232 mg/dL — ABNORMAL HIGH (ref 70–99)
Glucose-Capillary: 170 mg/dL — ABNORMAL HIGH (ref 70–99)

## 2010-08-14 LAB — CREATININE, SERUM
Creatinine, Ser: 0.79 mg/dL (ref 0.4–1.5)
GFR calc Af Amer: >60 mL/min (ref >60)
GFR calc non Af Amer: >60 mL/min (ref >60)

## 2010-08-14 LAB — POTASSIUM: Potassium: 2.8 mEq/L — ABNORMAL LOW (ref 3.5–5.1)

## 2010-08-14 NOTE — Op Note (Signed)
NAME:  Nathan Medina, Nathan Medina               ACCOUNT NO.:  [id number]  MEDICAL RECORD NO.:  [id number]           PATIENT TYPE:  I  LOCATION:  1537                         FACILITY:  [facility]  PHYSICIAN:  Ollen Gross. Vernell Morgans, M.D. 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  PROCEDURE:  Laparoscopic-assisted sigmoid colectomy with descending colostomy, Hartmann pouch and repair of the bladder.  SURGEON:  Ollen Gross. Vernell Morgans, M.D.  ASSISTANT:  Dr. Andrey Campanile  ANESTHESIA:  General endotracheal.  PROCEDURE:  After informed consent was obtained, the patient was brought to the operating, placed in supine position on the operating table. After induction of general anesthesia, the patient's abdomen was preppedwith ChloraPrep.  Perineum was prepped with Betadine, allowed to dry and then draped in the usual sterile manner.  The patient was in lithotomy position for this. After draping, an area below the umbilicus was infiltrated with 0.25% Marcaine.  A small incision was made a 15-blade knife and the incision was carried down through the subcutaneous tissue bluntly with hemostat and Army-Navy retractors until the linea alba was identified.  The linea alba was incised with a 15-blade knife and each side was grasped with Kocher clamps and elevated anteriorly.  The preperitoneal space was probed bluntly with hemostat until the peritoneum was opened and access was gained to the abdominal cavity.  A 0 Vicryl pursestring stitch was placed in the fascia around the opening. Hasson cannula was placed through the opening, anchored in place to previously placed Vicryl pursestring stitch.  The abdomen was then insufflated with carbon dioxide without  difficulty.  The patient was placed in Trendelenburg position and rotated with the left side up. Sites on the right abdomen above and below the umbilicus were chosen for a placement of 5-mm ports.  Each of these areas was infiltrated with 0.25% Marcaine.  Small stab incisions were made with 15-blade knife and 5-mm ports were placed bluntly through these incisions into the abdominal cavity under direct vision and placed a 5-mm 30-degree scope through the upper 5-mm port and then using a Glassman grasper and harmonic scalpel, we were able to take down some of the attachments of the left colon and sigmoid colon to the retroperitoneum along the white line of Toldt, and the lower sigmoid and rectal area were stuck pretty densely and so at this point we decided to abort the laparoscopy.  We made a lower midline incision below the umbilicus with a 10-blade knife. This incision was carried down through the skin and subcutaneous tissue sharply with electrocautery.  The linea alba was incised with electrocautery.  The Hasson cannula was removed and the rest of the incision was opened under direct vision.  An Alexis wound protector was deployed.  We were then able to identify some of the lower rectum.  Some of the adhesions of the rectosigmoid area were able to be finger fractured, but there was a very dense  connection of the rectosigmoid to the top of the bladder.  We had to carve through a little bit of the outside layer of the bladder to get this separated.  Once this was accomplished, we were able to identify the rectum pretty well.  The rectum itself had a fairly normal appearance, but the mesentery to the rectosigmoid was extremely thick and chronically inflamed.  We chose a site above the area of involvement of the fistula where the sigmoid colon and left colon appeared normal.  We opened the mesentery at this point sharply with the electrocautery, placed a GIA 75 stapler across this point,  clamped and fired thereby dividing the colon between staple lines.  The mesentery to the sigmoid colon was then taken down sharply with the Enseal super jaw.  Couple of larger vessels were clamped with Kelly clamps, divided with the super jaw and then ligated with 2-0 silk ties.  Some other bleeding was controlled with figure-of-eight 2-0 silk stitches.  We took this dissection close to the colon wall so as to avoid the ureter and took the dissection down to the rectosigmoid where the rectal wall looked fairly normal.  The mesenteric dissection was carried up to this point.  We then placed a green load contour stapler across the rectal stump, clamped and fired it thereby dividing the rectum between staple lines.  Once this was accomplished, we were then able to remove the rectosigmoid and sent this to pathology for further evaluation.  The abdomen and pelvis were then irrigated with copious amounts of saline.  At this point, Dr. Patsi Sears came in and repaired the bladder.  His portion will be dictated separately.  There appeared to be plenty of mobilization of the left colon to reach out to the abdominal wall.  We then chose a site on the left abdominal wall for bringing out the ostomy. A circular portion of skin and subcutaneous fat was excised sharply with electrocautery.  A cruciate incision was made in the fascia with the Bovie until the 3 fingers could be brought through the opening.  We had some bleeding from the muscle that was controlled with Vicryl stitches in the abdominal wall and once this was controlled, we then placed a Babcock through this opening and used it to grasp the staple line of the descending colon and brought this carefully up through the abdominal wall.  The distal end of the colon was a little bit ischemic, but we had enough mobilization that we were going to excise a couple of centimeters of the distal colon before creating the ostomy.  A piece of Seprafilm was  then placed in the lower pelvis between the rectum and the bladder.  Another piece was placed just under the anterior abdominal wall incision.  The anterior abdominal wall incision was then closed with 2 running #1 double-stranded loop PDS sutures.  Subcutaneous tissue was irrigated with copious amounts of saline and Betadine and the skin was closed with staples.  Sterile towel was placed over the staples.  The last few centimeters of the descending colon at the ostomy was then excised sharply with the electrocautery. The ostomy was then matured with interrupted 3-0 Vicryl stitches and the mucosa of the colostomy appeared viable.  Finger was inserted through the colostomy and it was patent all the way into the abdomen.  At this point, a vacuum skin dressing was then applied to the midline incision and a colostomy bag was placed around the colostomy.  The sterile dressings  were applied.  The patient tolerated the procedure well.  At the end of case, all needle, sponge, and instrument counts were correct. The patient was then awakened and taken to recovery in stable condition. We decided to do the colostomy because of the chronic inflammatory change in the colon and rectum and the fact that the patient had underlying diabetes and heart disease. Ollen Gross. Vernell Morgans, M.D.     PST/MEDQ  D:  08/11/2010  T:  08/12/2010  Job:  [id number]  Electronically Signed by Chevis Pretty III M.D. on 08/14/2010 07:34:01 AM

## 2010-08-15 LAB — GLUCOSE, CAPILLARY
Glucose-Capillary: 231 mg/dL — ABNORMAL HIGH (ref 70–99)
Glucose-Capillary: 228 mg/dL — ABNORMAL HIGH (ref 70–99)
Glucose-Capillary: 138 mg/dL — ABNORMAL HIGH (ref 70–99)
Glucose-Capillary: 142 mg/dL — ABNORMAL HIGH (ref 70–99)
Glucose-Capillary: 213 mg/dL — ABNORMAL HIGH (ref 70–99)
Glucose-Capillary: 153 mg/dL — ABNORMAL HIGH (ref 70–99)

## 2010-08-15 LAB — CREATININE, SERUM
Creatinine, Ser: 1.04 mg/dL (ref 0.4–1.5)
GFR calc Af Amer: >60 mL/min (ref >60)
GFR calc non Af Amer: >60 mL/min (ref >60)

## 2010-08-15 LAB — HEMOGLOBIN: Hemoglobin: 13.4 g/dL (ref 13.0–17.0)

## 2010-08-15 LAB — POTASSIUM: Potassium: 3 mEq/L — ABNORMAL LOW (ref 3.5–5.1)

## 2010-08-16 LAB — BASIC METABOLIC PANEL
BUN: 14 mg/dL (ref 6–23)
CO2: 29 mEq/L (ref 19–32)
Calcium: 8.5 mg/dL (ref 8.4–10.5)
Chloride: 105 mEq/L (ref 96–112)
Creatinine, Ser: 0.99 mg/dL (ref 0.4–1.5)
GFR calc Af Amer: >60 mL/min (ref >60)
GFR calc non Af Amer: >60 mL/min (ref >60)
Glucose, Bld: 137 mg/dL — ABNORMAL HIGH (ref 70–99)
Potassium: 2.9 mEq/L — ABNORMAL LOW (ref 3.5–5.1)
Sodium: 140 mEq/L (ref 135–145)

## 2010-08-16 LAB — DIFFERENTIAL
Basophils Absolute: 0.1 10*3/uL (ref 0.0–0.1)
Basophils Relative: 1 % (ref 0–1)
Eosinophils Absolute: 0.3 10*3/uL (ref 0.0–0.7)
Eosinophils Relative: 3 % (ref 0–5)
Lymphocytes Relative: 24 % (ref 12–46)
Lymphs Abs: 2.2 10*3/uL (ref 0.7–4.0)
Monocytes Absolute: 0.9 10*3/uL (ref 0.1–1.0)
Monocytes Relative: 10 % (ref 3–12)
Neutro Abs: 5.6 10*3/uL (ref 1.7–7.7)
Neutrophils Relative %: 62 % (ref 43–77)

## 2010-08-16 LAB — CBC
HCT: 41 % (ref 39.0–52.0)
Hemoglobin: 13.3 g/dL (ref 13.0–17.0)
MCH: 29.8 pg (ref 26.0–34.0)
MCHC: 32.4 g/dL (ref 30.0–36.0)
MCV: 91.7 fL (ref 78.0–100.0)
Platelets: 206 10*3/uL (ref 150–400)
RBC: 4.47 MIL/uL (ref 4.22–5.81)
RDW: 13.5 % (ref 11.5–15.5)
WBC: 9.1 10*3/uL (ref 4.0–10.5)

## 2010-08-16 LAB — GLUCOSE, CAPILLARY
Glucose-Capillary: 149 mg/dL — ABNORMAL HIGH (ref 70–99)
Glucose-Capillary: 199 mg/dL — ABNORMAL HIGH (ref 70–99)
Glucose-Capillary: 125 mg/dL — ABNORMAL HIGH (ref 70–99)
Glucose-Capillary: 151 mg/dL — ABNORMAL HIGH (ref 70–99)

## 2010-08-16 NOTE — Op Note (Signed)
  NAME:  Nathan Medina, Nathan Medina               ACCOUNT NO.:  [id number]  MEDICAL RECORD NO.:  [id number]           PATIENT TYPE:  I  LOCATION:  0002                         FACILITY:  [facility]  PHYSICIAN:  Veleria Barnhardt I. Patsi Sears, M.D.DATE OF BIRTH:  [birth year]  DATE OF PROCEDURE: DATE OF DISCHARGE:                              OPERATIVE REPORT   PREOPERATIVE DIAGNOSIS:  Diverticulitis and colovesical fistula.  POSTOPERATIVE DIAGNOSIS:  Diverticulitis and colovesical fistula.  OPERATION:  Oversew of colovesical fistula, coverage with Xenform.  SURGEON:  Shaquanta Harkless I. Patsi Sears, M.D.  ASSISTANT:  Ollen Gross. Vernell Morgans, M.D.  HISTORY:  The patient is a 60 year old male with a "champagne" urine, diverticular abscess, now for exploration.  At the time of surgery, the patient's colon was stuck to the bladder, and this was dissected.  Area of the bladder dome, with suspected fistula was identified.  Bladder was filled with methylene blue saline, but no definite leakage was noted.  I elected to oversew the abnormal area of bladder, where the colon had been stuck to the bladder and then to reinforce the area with a 6-cm portion of Xenform.  This was accomplished using 3-0 Monocryl suture and 2-0 Vicryl suture.  The patient tolerated this portion of procedure well.  He then underwent further surgery per Dr. Carolynne Edouard.     Tonya Carlile I. Patsi Sears, M.D.     SIT/MEDQ  D:  08/11/2010  T:  08/11/2010  Job:  [id number]  cc:   Ollen Gross. Vernell Morgans, M.D. [address] [place] [address]  Electronically Signed by Jethro Bolus M.D. on 08/16/2010 09:51:28 AM

## 2010-08-17 LAB — GLUCOSE, CAPILLARY
Glucose-Capillary: 138 mg/dL — ABNORMAL HIGH (ref 70–99)
Glucose-Capillary: 139 mg/dL — ABNORMAL HIGH (ref 70–99)
Glucose-Capillary: 116 mg/dL — ABNORMAL HIGH (ref 70–99)
Glucose-Capillary: 145 mg/dL — ABNORMAL HIGH (ref 70–99)

## 2010-08-18 LAB — GLUCOSE, CAPILLARY
Glucose-Capillary: 126 mg/dL — ABNORMAL HIGH (ref 70–99)
Glucose-Capillary: 112 mg/dL — ABNORMAL HIGH (ref 70–99)
Glucose-Capillary: 147 mg/dL — ABNORMAL HIGH (ref 70–99)
Glucose-Capillary: 174 mg/dL — ABNORMAL HIGH (ref 70–99)

## 2010-08-18 LAB — POTASSIUM: Potassium: 3.4 mEq/L — ABNORMAL LOW (ref 3.5–5.1)

## 2010-08-19 LAB — GLUCOSE, CAPILLARY: Glucose-Capillary: 135 mg/dL — ABNORMAL HIGH (ref 70–99)

## 2010-08-21 ENCOUNTER — Emergency Department (HOSPITAL_COMMUNITY)
Admission: EM | Admit: 2010-08-21 | Discharge: 2010-08-22 | Disposition: A | Discharge status: Home / Self Care | Payer: BC Managed Care – PPO | Attending: Emergency Medicine | Admitting: Emergency Medicine

## 2010-08-21 DIAGNOSIS — IMO0002 Reserved for concepts with insufficient information to code with codable children: Secondary | ICD-10-CM | POA: Insufficient documentation

## 2010-08-21 DIAGNOSIS — Z933 Colostomy status: Secondary | ICD-10-CM | POA: Insufficient documentation

## 2010-08-21 DIAGNOSIS — Y839 Surgical procedure, unspecified as the cause of abnormal reaction of the patient, or of later complication, without mention of misadventure at the time of the procedure: Secondary | ICD-10-CM | POA: Insufficient documentation

## 2010-08-21 DIAGNOSIS — E119 Type 2 diabetes mellitus without complications: Secondary | ICD-10-CM | POA: Insufficient documentation

## 2010-08-21 DIAGNOSIS — I10 Essential (primary) hypertension: Secondary | ICD-10-CM | POA: Insufficient documentation

## 2010-08-24 ENCOUNTER — Telehealth: Payer: Self-pay

## 2010-08-24 NOTE — Telephone Encounter (Signed)
Pt called stating he had post-op appt today with Dr Carolynne Edouard and his BP was 170/100. Per pt, Dr Carolynne Edouard was concerned about BP as well and his medication not being digested completely. Per pt he can identify most of his medication in his BMs. Pt was advised to make OV with JWJ as soon as possible (next available is Monday) but pt is concerned that this is not soon enough. Please advise.

## 2010-08-24 NOTE — Telephone Encounter (Signed)
This is ok unless he has HA, CP , blood in urine, facial or extremity sudden weakness or other similar symptoms to suggest BP is causing damage

## 2010-08-26 ENCOUNTER — Other Ambulatory Visit: Payer: Self-pay | Admitting: Internal Medicine

## 2010-08-28 ENCOUNTER — Other Ambulatory Visit: Payer: Self-pay

## 2010-08-28 MED ORDER — DILTIAZEM HCL ER 240 MG PO CP24: 240.0000 mg | ORAL_CAPSULE | Freq: Every day | ORAL | Status: DC

## 2010-08-28 NOTE — Telephone Encounter (Signed)
Pt advised of MD recommendations and had appt scheduled 04/10

## 2010-08-29 ENCOUNTER — Encounter: Payer: Self-pay | Admitting: Internal Medicine

## 2010-08-29 ENCOUNTER — Ambulatory Visit (INDEPENDENT_AMBULATORY_CARE_PROVIDER_SITE_OTHER): Payer: BC Managed Care – PPO | Admitting: Internal Medicine

## 2010-08-29 DIAGNOSIS — E119 Type 2 diabetes mellitus without complications: Secondary | ICD-10-CM

## 2010-08-29 DIAGNOSIS — F411 Generalized anxiety disorder: Secondary | ICD-10-CM

## 2010-08-29 DIAGNOSIS — R609 Edema, unspecified: Secondary | ICD-10-CM

## 2010-08-29 DIAGNOSIS — I10 Essential (primary) hypertension: Secondary | ICD-10-CM

## 2010-08-29 NOTE — Assessment & Plan Note (Signed)
Improved and stable overall by hx and exam, most recent lab reviewed with pt, and pt to continue medical treatment as before  BP Readings from Last 3 Encounters:  08/29/10 132/78  08/10/10 160/94  06/19/10 142/80

## 2010-08-29 NOTE — Progress Notes (Signed)
Subjective:    Patient ID: Nathan Medina, male    DOB: 1950/11/03, 60 y.o.   MRN: [id number]  HPI  Here to f/u;  Did lose from 75 to 214 with the surgury, and fort surgury relatively uncomplicated except for mid'lower abd incisional dehiscence now getting wound care at home not yet yealed,  And forced to have temp colostomy due to marked inflammation at time of surgury;  Due for takedown colostomy in about 3 mo per Dr Carolynne Edouard;  Did not take BP med today; pt concerned that he sees pills in the colostomy bag , even the gel caps, with no seeming pattern, have been taking all meds until this am b/c felt he might be "wasting" the meds;  BP yesterday 139/86;  He is willing to re-start meds though.  Has had some trouble with ostomy bag staying on and several accidents.  Pt denies chest pain, increased sob or doe, wheezing, orthopnea, PND, increased LE swelling, palpitations, dizziness or syncope.  Pt denies new neurological symptoms such as new headache, or facial or extremity weakness or numbness   Pt denies polydipsia, polyuria, or low sugar symptoms such as weakness or confusion improved with po intake.  Pt states overall good compliance with meds, trying to follow lower cholesterol, diabetic diet, wt overall stable but little exercise however post op;  Not checking cbg's except for a few time at 139 or so.  Denies worsening depressive symptoms, suicidal ideation, or panic, though has ongoing anxiety, mild increased recently.   Pt denies fever,  night sweats, loss of appetite, or other constitutional symptoms  Past Medical History  Diagnosis Date  . HYPERTENSION 06/25/2007  . ALLERGIC RHINITIS 06/25/2007  . ANXIETY 06/25/2007  . COLONIC POLYPS, HX OF 06/25/2007  . DEPRESSION 06/25/2007  . DIABETES MELLITUS, TYPE II 06/25/2007  . Diarrhea of presumed infectious origin 02/14/2010  . DIVERTICULITIS, ACUTE 02/15/2010  . GERD 06/25/2007  . GOUT 06/25/2007  . HYPERLIPIDEMIA 06/25/2007  . IBS 06/25/2007  . 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 PERIPHERAL EDEMA 11/04/2009   Past Surgical History  Procedure Date  . Hernia repair   . Septoplasty     reports that he has never smoked. He does not have any smokeless tobacco history on file. He reports that he does not drink alcohol or use illicit drugs. family history includes Hypertension in his maternal grandmother and paternal aunt and Stroke in his maternal grandmother. Allergies  Allergen Reactions  . Escitalopram Oxalate   . Glimepiride   . Quinapril Hcl     REACTION: rash   Current Outpatient Prescriptions on File Prior to Visit  Medication Sig Dispense Refill  . allopurinol (ZYLOPRIM) 300 MG tablet Take 300 mg by mouth daily.        [name] ALPRAZolam (XANAX) 0.5 MG tablet Take 0.5 mg by mouth. 1/2 - 1 by mouth once daily as needed       . aspirin 81 MG EC tablet Take 81 mg by mouth daily.        [name] diltiazem (DILACOR XR) 240 MG 24 hr capsule Take 1 capsule (240 mg total) by mouth daily.  90 capsule  0  . furosemide (LASIX) 40 MG tablet Take 40 mg by mouth daily.        [name] glimepiride (AMARYL) 4 MG tablet Take 4 mg by mouth 2 (two) times daily.        . hydrALAZINE (APRESOLINE) 50 MG tablet Take 2 tablets (100 mg total) by mouth 3 (  three) times daily.  180 tablet  11  . Lancets MISC Use as directed once daily       . metFORMIN (GLUMETZA) 500 MG (MOD) 24 hr tablet Take 500 mg by mouth 3 (three) times daily.        . Nebivolol HCl (BYSTOLIC) 20 MG TABS Take by mouth daily. 2 by mouth every morning       . omeprazole (PRILOSEC) 20 MG capsule Take 20 mg by mouth daily.        . potassium chloride (KLOR-CON 10) 10 MEQ CR tablet Take 10 mEq by mouth 3 (three) times daily.        . tadalafil (CIALIS) 20 MG tablet Take 20 mg by mouth daily as needed.        . Tamsulosin HCl (FLOMAX) 0.4 MG CAPS Take by mouth daily.        [name] telmisartan (MICARDIS) 80 MG tablet Take 80 mg by mouth daily.        . traMADol (ULTRAM) 50 MG tablet Take 50 mg by mouth. 1-2 by mouth every 6  hours as needed for pain        Review of Systems Review of Systems  Constitutional: Negative for diaphoresis and unexpected weight change.  HENT: Negative for drooling and tinnitus.   Eyes: Negative for photophobia and visual disturbance.  Respiratory: Negative for choking and stridor.   Gastrointestinal: Negative for vomiting and blood in stool.  Genitourinary: Negative for hematuria and decreased urine volume.  Musculoskeletal: Negative for gait problem.  Skin: Negative for color change and wound.  Neurological: Negative for tremors and numbness.  Psychiatric/Behavioral: Negative for decreased concentration. The patient is not hyperactive.       Objective:   Physical ExamBP 132/78  Pulse 66  Temp(Src) 97.6 F (36.4 C) (Oral)  Ht 5\' 8"  (1.727 m)  Wt 214 lb (97.07 kg)  BMI 32.54 kg/m2  SpO2 97% Physical Exam  VS noted Constitutional: Pt appears well-developed and well-nourished.  HENT: Head: Normocephalic.  Right Ear: External ear normal.  Left Ear: External ear normal.  Eyes: Conjunctivae and EOM are normal. Pupils are equal, round, and reactive to light.  Neck: Normal range of motion. Neck supple.  Cardiovascular: Normal rate and regular rhythm.   Pulmonary/Chest: Effort normal and breath sounds normal.  Abd:  Soft, NT, non-distended, + BS Neurological: Pt is alert. No cranial nerve deficit.  Skin: Skin is warm. No erythema. trace edema bilat LE's Psychiatric: Pt behavior is normal. Thought content normal.  Abd with tender postop incision site but no drainage or swelling, o/w no guarding or rebound        Assessment & Plan:

## 2010-08-29 NOTE — Patient Instructions (Signed)
Ok to continue all medications Please check your blood sugars and call for any low sugars Remember, your wt should not decrease further, but if it does, and you have low sugars, you may need to hold the glipizide Please return in 3 mo with Lab testing done 3-5 days before

## 2010-08-29 NOTE — Assessment & Plan Note (Signed)
stable overall by hx and exam, most recent lab reviewed with pt, and pt to continue medical treatment as before , declines further tx such as ssri or cousneling

## 2010-08-29 NOTE — Assessment & Plan Note (Signed)
stable overall by hx and exam, most recent lab reviewed with pt, and pt to continue medical treatment as before  Lab Results  Component Value Date   HGBA1C  Value: 6.4 (NOTE)                                                                       According to the ADA Clinical Practice Recommendations for 2011, when HbA1c is used as a screening test:   >=6.5%   Diagnostic of Diabetes Mellitus           (if abnormal result  is confirmed)  5.7-6.4%   Increased risk of developing Diabetes Mellitus  References:Diagnosis and Classification of Diabetes Mellitus,Diabetes Care,2011,34(Suppl 1):S62-S69 and Standards of Medical Care in         Diabetes - 2011,Diabetes Care,2011,34  (Suppl 1):S11-S61.* 08/12/2010

## 2010-08-29 NOTE — Assessment & Plan Note (Signed)
Trace overall today, stable overall by hx and exam, most recent lab reviewed with pt, and pt to continue medical treatment as before

## 2010-09-13 ENCOUNTER — Telehealth: Payer: Self-pay

## 2010-09-13 NOTE — Telephone Encounter (Signed)
RN called to report pt's vitals, BP 150/102 HR 56 temp 96.3. Pt states that he has a difficult time sleeping last night and his medications are still not dissolving, visible in colostomy bag.

## 2010-09-13 NOTE — Telephone Encounter (Signed)
Ok to cont meds as is for now; pt may want to consult pharmacist regarding the meds that seem to come through the colostomy bag, and if there is better alternative that will not do this

## 2010-09-14 NOTE — Telephone Encounter (Signed)
Pt advised of above but is still concerned about his BP, states today it was 174/107, please advise.

## 2010-09-14 NOTE — Telephone Encounter (Signed)
Please have pt come for Nurse visit apr 27 to document HR and BP;  If possible, we may be able to increase the bystolic

## 2010-09-15 ENCOUNTER — Ambulatory Visit: Payer: BC Managed Care – PPO | Admitting: Internal Medicine

## 2010-09-15 NOTE — Telephone Encounter (Signed)
Pt states he had scheduled OV with Dr Jonny Ruiz for this afternoon

## 2010-09-17 ENCOUNTER — Encounter: Payer: Self-pay | Admitting: Internal Medicine

## 2010-09-17 DIAGNOSIS — Z0000 Encounter for general adult medical examination without abnormal findings: Secondary | ICD-10-CM | POA: Insufficient documentation

## 2010-09-17 NOTE — Discharge Summary (Signed)
  NAME:  Nathan Medina, Nathan Medina               ACCOUNT NO.:  [id number]  MEDICAL RECORD NO.:  [id number]           PATIENT TYPE:  I  LOCATION:  1537                         FACILITY:  [facility]  PHYSICIAN:  Ollen Gross. Vernell Morgans, M.D. DATE OF BIRTH:  01-Jul-1950  DATE OF ADMISSION:  08/11/2010 DATE OF DISCHARGE:  08/19/2010                              DISCHARGE SUMMARY   HISTORY AND HOSPITAL COURSE:  [name] is a 60 year old white male who had a sigmoid diverticulitis with perforation and colovesical fistula. He was brought to the operating room on March 23 where he underwent a sigmoid colectomy with a descending colostomy and repair of the bladder, that portion was done by Dr. Patsi Sears.  He tolerated the operation well and his Foley was left in for 7 days secondary to the bladder repair.  Postoperative day 2, he was started on clear liquids.  He was maintained on insulin-sliding scale for his diabetes.  On March 26, we were able to start advancing his diet a little bit.  He was having significant back pain.  We added a heating pad and a muscle relaxer with significant improvement.  He was then on March 27 able to start mobilizing.  We did get Physical Therapy consult on March 28.  We were able to advance his diet more and start him on oral pain medicine.  He was also maintained on Entereg protocol postoperatively and on March 30, we discontinued his Foley and on March 31, he was discharged home.  MEDICATIONS:  He will resume his home medications and he was given a prescription for pain medicine.  ACTIVITIES:  No heavy lifting.  FINAL DIAGNOSIS:  Sigmoid diverticulitis with perforation and fistula.  FOLLOWUP:  Follow up with Dr. Carolynne Edouard in a week.  He is discharged home with some home health ostomy care.     Ollen Gross. Vernell Morgans, M.D.     PST/MEDQ  D:  09/12/2010  T:  09/12/2010  Job:  [id number]  Electronically Signed by Chevis Pretty III M.D. on 09/17/2010 06:41:02 AM

## 2010-09-18 ENCOUNTER — Ambulatory Visit (INDEPENDENT_AMBULATORY_CARE_PROVIDER_SITE_OTHER): Payer: BC Managed Care – PPO | Admitting: Internal Medicine

## 2010-09-18 ENCOUNTER — Encounter: Payer: Self-pay | Admitting: Internal Medicine

## 2010-09-18 VITALS — BP 152/90 | HR 60 | Temp 97.9°F | Ht 68.0 in | Wt 217.2 lb

## 2010-09-18 DIAGNOSIS — Z0000 Encounter for general adult medical examination without abnormal findings: Secondary | ICD-10-CM

## 2010-09-18 DIAGNOSIS — E119 Type 2 diabetes mellitus without complications: Secondary | ICD-10-CM

## 2010-09-18 DIAGNOSIS — I10 Essential (primary) hypertension: Secondary | ICD-10-CM

## 2010-09-18 DIAGNOSIS — F411 Generalized anxiety disorder: Secondary | ICD-10-CM

## 2010-09-18 DIAGNOSIS — R609 Edema, unspecified: Secondary | ICD-10-CM

## 2010-09-18 MED ORDER — OLMESARTAN MEDOXOMIL 40 MG PO TABS: 40.0000 mg | ORAL_TABLET | Freq: Every day | ORAL | Status: DC

## 2010-09-18 NOTE — Patient Instructions (Addendum)
Please take the glimeparide 4 mg twice per day (at different times of the day) Please stop the micardis Start the Benicar 40 mg (you are given the 4 wks samples and the discount card) Continue all other medications as before Please return in Nov 2012 with Lab testing done 3-5 days before, or sooner if needed Please call if BP not improved in 3-5 days, for possible referral to Renal

## 2010-09-18 NOTE — Assessment & Plan Note (Addendum)
Is accidentally taking amaryl all in the am with some noontime wooziness better with eating - pt to split up the dosing to avoid noon time low sugar,  to f/u any worsening symptoms or concerns; ;lasst a1c 6.4 mar 24 per echart

## 2010-09-18 NOTE — Progress Notes (Signed)
Subjective:    Patient ID: Nathan Medina, male    DOB: 06-17-1950, 60 y.o.   MRN: [id number]  HPI  Here to f/u s/p diverticulitis with sigmoid perforationa and colovesicle fistula repair mar 23;  Post op course uneventful and has seen DR Carolynne Edouard each wk for 2 wks post op , then due to f/u again next wk felt to be doing reasonably well;  Did see urology post d/c as well with some notation of ? Something in the urine but no action needed at that time;  Pt did note some pills for BP and the klor con specifically seemed to pass through coming out in the colostomy bag undigested, but now digestion improved, though klor con seeming to be still coming through intact.  Tentative plan is for colostomy takedown at 6 mo (possibly October);  Bag seems to adhere better now as well without as often falling off and accidents.  BP initially high post op as well, he thinks maybe due to the BP meds not being digested and absorbed.  Overall wt down from 234 to 216 per pt at home.  BP per the home health nurse seems to be ? Going up lately ;  Today 152/90 (with the 90 part concerning him, as others have been 104, 100, and 99, systolic in the 150 's and 160's).  Has excellent overall compliance with meds. Pt denies chest pain, increased sob or doe, wheezing, orthopnea, PND, increased LE swelling, palpitations, dizziness or syncope.  Pt denies new neurological symptoms such as new headache, or facial or extremity weakness or numbness .   Pt denies polydipsia, polyuria, or low sugar symptoms such as weakness or confusion improved with po intake.  Pt states overall good compliance with meds, trying to follow lower cholesterol, diabetic diet, wt overall stable but little exercise however.  CBG's have been in the 130's, with feeling a bit woozy at noontime , is currently taking the amaryl 8 mg all in the AM .  Denies worsening depressive symptoms, suicidal ideation, or panic, though has ongoing anxiety, with some increased stress with  surgyury. Past Medical History  Diagnosis Date  . HYPERTENSION 06/25/2007  . ALLERGIC RHINITIS 06/25/2007  . ANXIETY 06/25/2007  . COLONIC POLYPS, HX OF 06/25/2007  . DEPRESSION 06/25/2007  . DIABETES MELLITUS, TYPE II 06/25/2007  . Diarrhea of presumed infectious origin 02/14/2010  . DIVERTICULITIS, ACUTE 02/15/2010  . GERD 06/25/2007  . GOUT 06/25/2007  . HYPERLIPIDEMIA 06/25/2007  . IBS 06/25/2007  . 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 PERIPHERAL EDEMA 11/04/2009   Past Surgical History  Procedure Date  . Hernia repair   . Septoplasty     reports that he has never smoked. He does not have any smokeless tobacco history on file. He reports that he does not drink alcohol or use illicit drugs. family history includes Hypertension in his maternal grandmother and paternal aunt and Stroke in his maternal grandmother. Allergies  Allergen Reactions  . Escitalopram Oxalate   . Glimepiride   . Quinapril Hcl     REACTION: rash   Current Outpatient Prescriptions on File Prior to Visit  Medication Sig Dispense Refill  . allopurinol (ZYLOPRIM) 300 MG tablet Take 300 mg by mouth daily.        [name] ALPRAZolam (XANAX) 0.5 MG tablet Take 0.5 mg by mouth. 1/2 - 1 by mouth once daily as needed       . aspirin 81 MG EC tablet Take 81 mg by mouth daily.        [name]  diltiazem (DILACOR XR) 240 MG 24 hr capsule Take 1 capsule (240 mg total) by mouth daily.  90 capsule  0  . furosemide (LASIX) 40 MG tablet Take 40 mg by mouth daily.        [name] glimepiride (AMARYL) 4 MG tablet Take 4 mg by mouth 2 (two) times daily.        . hydrALAZINE (APRESOLINE) 50 MG tablet Take 2 tablets (100 mg total) by mouth 3 (three) times daily.  180 tablet  11  . Lancets MISC Use as directed once daily       . metFORMIN (GLUMETZA) 500 MG (MOD) 24 hr tablet Take 500 mg by mouth 3 (three) times daily.        [name] omeprazole (PRILOSEC) 20 MG capsule Take 20 mg by mouth daily.        . potassium chloride (KLOR-CON 10) 10 MEQ CR tablet Take 10 mEq by  mouth 3 (three) times daily.        . tadalafil (CIALIS) 20 MG tablet Take 20 mg by mouth daily as needed.        . Tamsulosin HCl (FLOMAX) 0.4 MG CAPS Take by mouth daily.        [name] telmisartan (MICARDIS) 80 MG tablet Take 80 mg by mouth daily.        . traMADol (ULTRAM) 50 MG tablet Take 50 mg by mouth. 1-2 by mouth every 6 hours as needed for pain       . Nebivolol HCl (BYSTOLIC) 20 MG TABS Take by mouth daily. 2 by mouth every morning        Review of Systems Review of Systems  Constitutional: Negative for diaphoresis and unexpected weight change.  HENT: Negative for drooling and tinnitus.   Eyes: Negative for photophobia and visual disturbance.  Respiratory: Negative for choking and stridor.   Gastrointestinal: Negative for vomiting and blood in stool.  Genitourinary: Negative for hematuria and decreased urine volume.  Musculoskeletal: Negative for gait problem.  Skin: Negative for color change and wound.  Neurological: Negative for tremors and numbness.  Psychiatric/Behavioral: Negative for decreased concentration. The patient is not hyperactive.       Objective:   Physical Exam BP 152/90  Pulse 60  Temp(Src) 97.9 F (36.6 C) (Oral)  Ht 5\' 8"  (1.727 m)  Wt 217 lb 4 oz (98.544 kg)  BMI 33.03 kg/m2  SpO2 97% Physical Exam  VS noted Constitutional: Pt appears well-developed and well-nourished.  HENT: Head: Normocephalic.  Right Ear: External ear normal.  Left Ear: External ear normal.   No JVD Eyes: Conjunctivae and EOM are normal. Pupils are equal, round, and reactive to light.  Neck: Normal range of motion. Neck supple.  Cardiovascular: Normal rate and regular rhythm.   Pulmonary/Chest: Effort normal and breath sounds normal.  Abd:  Soft, NT, non-distended, + BS Neurological: Pt is alert. No cranial nerve deficit.  Skin: Skin is warm. No erythema.  has trace LE edema L>R Psychiatric: Pt behavior is normal. Thought content normal. 1+ nervous          Assessment &  Plan:

## 2010-09-18 NOTE — Assessment & Plan Note (Signed)
trace persistent, to cont the lasix as is

## 2010-09-18 NOTE — Assessment & Plan Note (Signed)
stable overall by hx and exam, most recent lab reviewed with pt, and pt to continue medical treatment as before Lab Results  Component Value Date   WBC 9.1 08/16/2010   HGB 13.3 08/16/2010   HCT 41.0 08/16/2010   PLT 206 08/16/2010   CHOL 100 04/03/2010   TRIG 192.0* 04/03/2010   HDL 29.50* 04/03/2010   LDLDIRECT 61.4 03/28/2009   ALT 31 04/03/2010   AST 32 04/03/2010   NA 140 08/16/2010   K 3.4* 08/18/2010   CL 105 08/16/2010   CREATININE 0.99 08/16/2010   BUN 14 08/16/2010   CO2 29 08/16/2010   TSH 1.98 04/03/2010   PSA 1.97 04/03/2010   HGBA1C  Value: 6.4 (NOTE)                                                                       According to the ADA Clinical Practice Recommendations for 2011, when HbA1c is used as a screening test:   >=6.5%   Diagnostic of Diabetes Mellitus           (if abnormal result  is confirmed)  5.7-6.4%   Increased risk of developing Diabetes Mellitus  References:Diagnosis and Classification of Diabetes Mellitus,Diabetes Care,2011,34(Suppl 1):S62-S69 and Standards of Medical Care in         Diabetes - 2011,Diabetes Care,2011,34  (Suppl 1):S11-S61.* 08/12/2010   MICROALBUR 4.3* 04/03/2010

## 2010-09-18 NOTE — Assessment & Plan Note (Signed)
To change the micardis to benicar 40 mg for better control, to call in 3-5 days if BP not improved; to consider renal referral for better BP control per pt request

## 2010-09-21 ENCOUNTER — Other Ambulatory Visit: Payer: Self-pay | Admitting: General Surgery

## 2010-09-21 DIAGNOSIS — L0291 Cutaneous abscess, unspecified: Secondary | ICD-10-CM

## 2010-09-22 ENCOUNTER — Ambulatory Visit
Admission: RE | Admit: 2010-09-22 | Discharge: 2010-09-22 | Disposition: A | Discharge status: Home / Self Care | Payer: BC Managed Care – PPO | Source: Ambulatory Visit | Attending: General Surgery | Admitting: General Surgery

## 2010-09-22 DIAGNOSIS — L0291 Cutaneous abscess, unspecified: Secondary | ICD-10-CM

## 2010-09-22 MED ORDER — IOHEXOL 300 MG/ML  SOLN
125.0000 mL | Freq: Once | INTRAMUSCULAR | Status: AC | PRN
  Administered 2010-09-22: 125 mL via INTRAVENOUS

## 2010-09-29 ENCOUNTER — Telehealth: Payer: Self-pay

## 2010-09-29 DIAGNOSIS — I10 Essential (primary) hypertension: Secondary | ICD-10-CM

## 2010-09-29 NOTE — Telephone Encounter (Signed)
Done per emr 

## 2010-09-29 NOTE — Telephone Encounter (Signed)
Pt advised and will expect call from [facility] with appt info

## 2010-09-29 NOTE — Telephone Encounter (Signed)
Pt called stating there has been no improvement in his BP since OV, reading have been 180s/90-100s. Pt is requesting renal referral as discussed at OV.

## 2010-10-03 ENCOUNTER — Other Ambulatory Visit: Payer: Self-pay

## 2010-10-03 NOTE — Op Note (Signed)
NAME:  Nathan Medina, Nathan Medina               ACCOUNT NO.:  [id number]   MEDICAL RECORD NO.:  [id number]          PATIENT TYPE:  AMB   LOCATION:  ENDO                         FACILITY:  [facility]   PHYSICIAN:  James L. Malon Kindle., M.D.DATE OF BIRTH:  1951/04/08   DATE OF PROCEDURE:  08/23/2008  DATE OF DISCHARGE:                               OPERATIVE REPORT   PROCEDURE:  Colonoscopy with polypectomy.   MEDICATIONS:  Fentanyl 50 mcg, Versed 5 mg IV.   INDICATIONS:  The patient has had multiple previous colonoscopies, the  last approximately 4 months ago.  He had a 2 cm sessile polyp in the  cecum removed in multiple pieces.  It was not clear if it was all  removed and he is brought over to remove any residual polyp and to use  the argon plasma coagulator to cauterize any residual polyp..  The prep  was somewhat marginal.   DESCRIPTION OF PROCEDURE:  Procedure had been explained to the patient  and consent obtained.  In the left lateral decubitus position, the  Pentax pediatric colonoscope was inserted and advanced.  The prep was  adequate.  We reached the cecum, carefully examined the cecum in forward  and retroflexed view.  In the area of the previous polyp, I could not  find any residual polyp material.  The scope was withdrawn, the colon  carefully examined.  In the proximal descending colon, a 0.5-cm polyp  was removed.  In the distal descending colon near the junction of the  sigmoid, there were three 0.5- to 0.75-cm polyps that were sessile, that  were removed and placed in a single jar.  There were moderate  diverticulosis in the sigmoid colon.  The rectum was free of polyps.  The scope was withdrawn.  The patient tolerated the procedure well.   ASSESSMENT:  1. Multiple colon polyps removed in the descending colon.  2. No residual cecal polyp discovered.   PLAN:  Routine post polypectomy instructions and will plan on repeating  colonoscopy in 1 year.     ______________________________  Llana Aliment. Malon Kindle., M.D.     Waldron Session  D:  08/23/2008  T:  08/23/2008  Job:  [id number]   cc:   Corwin Levins, MD  [address]  [place]  [place] [address]

## 2010-10-03 NOTE — Op Note (Signed)
NAME:  Nathan Medina, Nathan Medina               ACCOUNT NO.:  [id number]   MEDICAL RECORD NO.:  [id number]          PATIENT TYPE:  AMB   LOCATION:  ENDO                         FACILITY:  [facility]   PHYSICIAN:  James L. Malon Kindle., M.D.DATE OF BIRTH:  Apr 16, 1951   DATE OF PROCEDURE:  03/24/2007  DATE OF DISCHARGE:                               OPERATIVE REPORT   PROCEDURE:  Colonoscopy.   MEDICATIONS:  Fentanyl 50 mcg, Versed 4 mg and Phenergan 12.5 mg IV.   INDICATION:  The patient had a colon polyp removed about 6 weeks ago on  the cecum.  It was quite large.  We shaved down.  It was not clear that  it was removed completely.  This was near the appendiceal orifice.  This  is done to remove any additional polyp and to have the argon plasma  coagulator on standby if any further polyp was found.   DESCRIPTION OF PROCEDURE:  The procedure had been explained to the  patient and consent obtained.  In the left lateral decubitus position,  the scope was inserted and advanced.  The prep was adequate.  We were  able to reach the cecum, ileocecal valve and appendiceal orifice seen.  The area was carefully examined in the left lateral and in the supine  view and the appendix and ileocecal valve were clearly seen and there  were no residual polyps seen.  The scope was withdrawn.  The ascending,  transverse, descending and sigmoid colon were unremarkable with a  diverticula.  The rectum revealed internal hemorrhoids.   ASSESSMENT:  1. No evidence of any residual polyp.  It apparently was all removed      from the first colonoscopy.  2. Diverticulosis.   PLAN:  Due to the fact that the patient had a large, high-risk polyp and  had multiple colon polyps, we will recommend repeating his colonoscopy  in 1 year.           ______________________________  Llana Aliment. Malon Kindle., M.D.     Waldron Session  D:  03/24/2007  T:  03/25/2007  Job:  [id number]   cc:   Corwin Levins, MD  [address]  [place]  [place] [address]

## 2010-10-06 ENCOUNTER — Telehealth: Payer: Self-pay | Admitting: *Deleted

## 2010-10-06 NOTE — Op Note (Signed)
[facility]  Patient:    Nathan Medina, Nathan Medina                      MRN: [id number] Proc. Date: 04/25/00 Adm. Date:  [id number] Attending:  Chevis Pretty S                           Operative Report  PREOPERATIVE DIAGNOSIS:  Infected cyst on the back.  POSTOPERATIVE DIAGNOSIS:  Infected cyst on the back.  OPERATION PERFORMED:  Incision and drainage of infected cyst on back.  SURGEON:  Chevis Pretty, M.D.  ANESTHESIA:  Local with 1% lidocaine with epinephrine.  DESCRIPTION OF PROCEDURE:  After informed consent was obtained, the patient was brought to the operating room and placed in a prone position on the operating table.  The area of the patients back that was involved was prepped with Betadine and draped in the usual sterile manner after adequate infiltration of the area with local anesthetic.   An elliptical incision of skin was made over top of the previous hole through which this area had been draining.  This elliptical incision of skin was carried down through the subcutaneous tissues into the abscess cavity.  Very little purulence was able to be drained out.  The abscess cavity was probed and any loculations were broken up with a hemostat.  The wound was then packed with gauze and sterile dressings were applied on top of this.  The patient tolerated the procedure well.  At the end of the case all needle, sponge and instrument counts were correct.  The patient was taken to the recovery room in stable condition. Arrangements will be made for this patient to get home health dressing changes to his back on a daily basis. DD:  04/25/00 TD:  04/25/00 Job: [id number]

## 2010-10-06 NOTE — Telephone Encounter (Signed)
[facility] RN called - BP today 180/110, pt feels "fine" She will continue to monitor pt as scheduled and call back w/any changes.

## 2010-10-06 NOTE — Telephone Encounter (Signed)
Spoke w/PCC's and patient. Referral is in process, no apt yet. Pt is worried about continued elevated BP. Please advise.

## 2010-10-06 NOTE — Telephone Encounter (Signed)
Nathan Medina, CMA 10/06/2010 3:20 PM Signed  Spoke w/PCC's and patient. Referral is in process, no apt yet. Pt is worried about continued elevated BP. Please advise.  Nathan Medina, CMA 10/06/2010 3:14 PM Signed  [facility] RN called - BP today 180/110, pt feels "fine" She will continue to monitor pt as scheduled and call back w/any changes.

## 2010-10-07 ENCOUNTER — Emergency Department (HOSPITAL_COMMUNITY)
Admission: EM | Admit: 2010-10-07 | Discharge: 2010-10-07 | Disposition: A | Discharge status: Home / Self Care | Payer: BC Managed Care – PPO | Attending: Emergency Medicine | Admitting: Emergency Medicine

## 2010-10-07 DIAGNOSIS — Z933 Colostomy status: Secondary | ICD-10-CM | POA: Insufficient documentation

## 2010-10-07 DIAGNOSIS — E119 Type 2 diabetes mellitus without complications: Secondary | ICD-10-CM | POA: Insufficient documentation

## 2010-10-07 DIAGNOSIS — Z79899 Other long term (current) drug therapy: Secondary | ICD-10-CM | POA: Insufficient documentation

## 2010-10-07 DIAGNOSIS — I10 Essential (primary) hypertension: Secondary | ICD-10-CM | POA: Insufficient documentation

## 2010-10-07 LAB — BASIC METABOLIC PANEL
BUN: 19 mg/dL (ref 6–23)
CO2: 28 mEq/L (ref 19–32)
Calcium: 9.5 mg/dL (ref 8.4–10.5)
Chloride: 101 mEq/L (ref 96–112)
Creatinine, Ser: 1.03 mg/dL (ref 0.4–1.5)
GFR calc Af Amer: >60 mL/min (ref >60)
GFR calc non Af Amer: >60 mL/min (ref >60)
Glucose, Bld: 66 mg/dL — ABNORMAL LOW (ref 70–99)
Potassium: 3.5 mEq/L (ref 3.5–5.1)
Sodium: 139 mEq/L (ref 135–145)

## 2010-10-09 ENCOUNTER — Telehealth: Payer: Self-pay

## 2010-10-09 NOTE — Telephone Encounter (Signed)
I can only order the referrals, as I cannot really affect the other doctor's scheduling  I have nothing else to offer at this time

## 2010-10-09 NOTE — Telephone Encounter (Signed)
Pt called stating that on the advise of his [facility] he went to the ER because his BP was 212/112. Pt left a message Wednesday and Friday of last week, both were forwarded to the [facility]. Pt is upset and wants to know why he was not contacted with status of referral and why referral has not been made yet. I advised pt and HHRN that referral was in process Monday of last week. Pt is requesting a call back from MD, please advise?

## 2010-10-10 ENCOUNTER — Telehealth: Payer: Self-pay

## 2010-10-10 ENCOUNTER — Ambulatory Visit: Payer: Self-pay | Admitting: Internal Medicine

## 2010-10-10 MED ORDER — DILTIAZEM HCL ER COATED BEADS 360 MG PO TB24: 360.0000 mg | ORAL_TABLET | Freq: Every day | ORAL | Status: DC

## 2010-10-10 NOTE — Telephone Encounter (Signed)
Ok to try increased dilt 360 qd   Please notify pt

## 2010-10-10 NOTE — Telephone Encounter (Signed)
Pt informed to increase Dilt to 360. See previous note

## 2010-10-10 NOTE — Telephone Encounter (Signed)
Pt is still very concerned with elevated BP and is upset that his medications are not being changed at least temporarily while he waits for referral. Pt says that he is not " getting much attention " and feels as though MD does not think elevated BP is serious. Pt was assured that MD would be advised and would make changes as appropriate, please advise

## 2010-10-10 NOTE — Telephone Encounter (Signed)
Genevieve Norlander RN Verlon Au called with patients BP. Today BP was 190/100 and over the weekend an average of 200/110. Advise please, Leslie's call back number is [phone].

## 2010-10-10 NOTE — Telephone Encounter (Signed)
Pt advised of increase in medication

## 2010-10-12 ENCOUNTER — Telehealth: Payer: Self-pay

## 2010-10-12 ENCOUNTER — Other Ambulatory Visit: Payer: BC Managed Care – PPO

## 2010-10-12 NOTE — Telephone Encounter (Signed)
Verlon Au advised of same

## 2010-10-12 NOTE — Telephone Encounter (Signed)
unfort this is his typical BP's, despite mult meds, one of which was increased just within the last 1-2 days,  As long as there is no CP, sob, HA or other neuro symptoms, we should cont the new regimen for now

## 2010-10-12 NOTE — Telephone Encounter (Signed)
Genevieve Norlander RN Verlon Au called to inform patients BP was 180/100 today. Patient is taking his BP medication correctly. Call back number [phone].

## 2010-10-13 ENCOUNTER — Telehealth: Payer: Self-pay

## 2010-10-13 NOTE — Telephone Encounter (Signed)
HHRN called to report pt's BP 180/100. Same advised given per 10/12/2010 phone note. Rn denied any sxs.

## 2010-10-18 ENCOUNTER — Other Ambulatory Visit (INDEPENDENT_AMBULATORY_CARE_PROVIDER_SITE_OTHER): Payer: BC Managed Care – PPO

## 2010-10-18 DIAGNOSIS — I10 Essential (primary) hypertension: Secondary | ICD-10-CM

## 2010-10-19 ENCOUNTER — Other Ambulatory Visit: Payer: BC Managed Care – PPO

## 2010-10-19 ENCOUNTER — Telehealth: Payer: Self-pay

## 2010-10-19 NOTE — Telephone Encounter (Signed)
RN called to advise of pt's BP - 180/100. Verlon Au advise of same (see phone note 10/13/2010)

## 2010-10-23 ENCOUNTER — Ambulatory Visit: Payer: Self-pay | Admitting: Internal Medicine

## 2010-10-23 NOTE — Procedures (Unsigned)
RENAL ARTERY DUPLEX EVALUATION  INDICATION:  Hypertension, uncontrolled.  HISTORY: Diabetes:  Yes. Cardiac:  No. Hypertension:  Yes. Smoking:  No.  RENAL ARTERY DUPLEX FINDINGS: Aorta-Proximal:  81 cm/s Aorta-Mid:  78 cm/s Aorta-Distal:  99 cm/s Celiac Artery Origin:  96 cm/s SMA Origin:  104 cm/s                                   RIGHT               LEFT Renal Artery Origin:             69 cm/s             64 cm/s Renal Artery Proximal:           61 cm/s             107 cm/s Renal Artery Mid:                88 cm/s             70 cm/s Renal Artery Distal:             79 cm/s             52 cm/s Hilar Acceleration Time (AT): Renal-Aortic Ratio (RAR):        1.09                1.32 Kidney Size:                     12 cm               12 cm End Diastolic Ratio (EDR): Resistive Index (RI):            0.63/0.69           0.70/0.72  IMPRESSION: 1. No evidence of hemodynamically significant stenosis identified. 2. Bilateral kidney sizes are within normal limits. 3. Resistive indices are within normal limits bilaterally. 4. Incidental note made of a possible renal cyst involving the right     renal, measuring approximately 3.2 cm X 2.6 cm.  ___________________________________________ Quita Skye. Hart Rochester, M.D.  SH/MEDQ  D:  10/18/2010  T:  10/18/2010  Job:  [id number]

## 2010-11-04 ENCOUNTER — Encounter: Payer: Self-pay | Admitting: Internal Medicine

## 2010-11-16 ENCOUNTER — Other Ambulatory Visit: Payer: Self-pay | Admitting: Internal Medicine

## 2010-11-23 ENCOUNTER — Encounter (INDEPENDENT_AMBULATORY_CARE_PROVIDER_SITE_OTHER): Payer: Self-pay | Admitting: General Surgery

## 2010-11-23 ENCOUNTER — Ambulatory Visit (INDEPENDENT_AMBULATORY_CARE_PROVIDER_SITE_OTHER): Payer: BC Managed Care – PPO | Admitting: General Surgery

## 2010-11-23 DIAGNOSIS — K5732 Diverticulitis of large intestine without perforation or abscess without bleeding: Secondary | ICD-10-CM

## 2010-11-23 NOTE — Progress Notes (Signed)
Subjective:     Patient ID: AKSH SWART, male   DOB: 01/30/1951, 60 y.o.   MRN: [id number]    There were no vitals taken for this visit.    HPI The patient is a 60 year old white male who is now almost 4 months out from a sigmoid colectomy and colostomy for a colovesical fistula and sigmoid diverticulitis. His postoperative course has been complicated by difficulty with his ostomy. He has also had a superficial wound infection at the inferior portion of his incision. He has been doing dressing changes to the area and things seem to be improving for him.  Review of Systems     Objective:   Physical Exam On exam his abdomen is soft and nontender. His ostomy is nonproductive and healthy-appearing. His midline incision is almost completely healed. He has a small opening just big enough for the tip of a Q-tip to enter at the inferior portion of his wound. It is about 2 cm in depth. It is very clean.   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    Plan:     Continue dressing changes. I would like the wound to be completely healed prior to scheduling his ostomy reversal. I have gone over the risks and benefits of surgery including some of the technical aspects and he understands. We will see him back in about 3 weeks or so to check his progress.

## 2010-11-23 NOTE — Patient Instructions (Signed)
Continue daily dressing changes Shower daily F/U in 2-3 weeks

## 2010-11-28 ENCOUNTER — Ambulatory Visit (INDEPENDENT_AMBULATORY_CARE_PROVIDER_SITE_OTHER): Payer: BC Managed Care – PPO | Admitting: Internal Medicine

## 2010-11-28 ENCOUNTER — Encounter: Payer: Self-pay | Admitting: Internal Medicine

## 2010-11-28 ENCOUNTER — Other Ambulatory Visit (INDEPENDENT_AMBULATORY_CARE_PROVIDER_SITE_OTHER): Payer: BC Managed Care – PPO

## 2010-11-28 VITALS — BP 140/82 | HR 63 | Temp 98.8°F | Ht 68.0 in | Wt 224.2 lb

## 2010-11-28 DIAGNOSIS — R300 Dysuria: Secondary | ICD-10-CM

## 2010-11-28 DIAGNOSIS — I10 Essential (primary) hypertension: Secondary | ICD-10-CM

## 2010-11-28 DIAGNOSIS — J019 Acute sinusitis, unspecified: Secondary | ICD-10-CM | POA: Insufficient documentation

## 2010-11-28 DIAGNOSIS — H9190 Unspecified hearing loss, unspecified ear: Secondary | ICD-10-CM | POA: Insufficient documentation

## 2010-11-28 DIAGNOSIS — E119 Type 2 diabetes mellitus without complications: Secondary | ICD-10-CM

## 2010-11-28 LAB — URINALYSIS, ROUTINE W REFLEX MICROSCOPIC
Bilirubin Urine: NEGATIVE
Hgb urine dipstick: NEGATIVE
Ketones, ur: NEGATIVE
Nitrite: NEGATIVE
Specific Gravity, Urine: 1.01 (ref 1.000–1.030)
Total Protein, Urine: NEGATIVE
Urine Glucose: NEGATIVE
Urobilinogen, UA: 0.2 (ref 0.0–1.0)
pH: 6 (ref 5.0–8.0)

## 2010-11-28 MED ORDER — LEVOFLOXACIN 500 MG PO TABS: 500.0000 mg | ORAL_TABLET | Freq: Every day | ORAL | Status: AC

## 2010-11-28 MED ORDER — LANCETS MISC: Status: DC

## 2010-11-28 NOTE — Assessment & Plan Note (Signed)
?   Clinical signficance - for urine studies, and already tx with levaquin for the sinusitis

## 2010-11-28 NOTE — Assessment & Plan Note (Signed)
Mild to mod, for antibx course,  to f/u any worsening symptoms or concerns 

## 2010-11-28 NOTE — Patient Instructions (Addendum)
Take all new medications as prescribed Continue all other medications as before You will be contacted regarding the referral for: ENT Please keep your appointments with your specialists as you have planned - Dr Deterding/renal Please go to LAB in the Basement for the urine tests to be done today Please call the phone number [phone] (the PhoneTree System) for results of testing in 2-3 days;  When calling, simply dial the number, and when prompted enter the MRN number above (the Medical Record Number) and the # key, then the message should start. Please return in Nov 2012 with Lab testing done 3-5 days before

## 2010-11-28 NOTE — Assessment & Plan Note (Signed)
stable overall by hx and exam, most recent data reviewed with pt, and pt to continue medical treatment as before  Last a1c 5.28 October 2010

## 2010-11-28 NOTE — Assessment & Plan Note (Signed)
Ongoing, with bilat hearing loss and prob wax impactions, has not done well with disimpaction here, for ENT referral per pt request

## 2010-11-28 NOTE — Progress Notes (Signed)
Subjective:    Patient ID: Nathan Medina, male    DOB: [birth year], 60 y.o.   MRN: [id number]  HPI  Here with 3 days acute onset fever, facial pain, pressure, general weakness and malaise, and greenish d/c, with slight ST, but little to no cough and Pt denies chest pain, increased sob or doe, wheezing, orthopnea, PND, increased LE swelling, palpitations, dizziness or syncope.  Also with 1 wk onset mild dysuria, without  frequency, urgency,or hematuria, back pain, n/v. Pt denies new neurological symptoms such as new headache, or facial or extremity weakness or numbness   Pt denies polydipsia, polyuria.  Still with slow healing 2 cm abd wound, sees surgury for this. Has done well with BP med changes per renal;  There also had a1c test 5.9 about 2 wks ago.  Now on high dose lasix, still with some LE edema to both legs below the knees but improved. Denies worsening depressive symptoms, suicidal ideation, or panic, though has ongoing anxiety, not increased recently.  Past Medical History  Diagnosis Date  . HYPERTENSION 06/25/2007  . ALLERGIC RHINITIS 06/25/2007  . ANXIETY 06/25/2007  . COLONIC POLYPS, HX OF 06/25/2007  . DEPRESSION 06/25/2007  . DIABETES MELLITUS, TYPE II 06/25/2007  . Diarrhea of presumed infectious origin 02/14/2010  . DIVERTICULITIS, ACUTE 02/15/2010  . GERD 06/25/2007  . GOUT 06/25/2007  . HYPERLIPIDEMIA 06/25/2007  . IBS 06/25/2007  . 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 PERIPHERAL EDEMA 11/04/2009   Past Surgical History  Procedure Date  . Hernia repair   . Septoplasty     reports that he has never smoked. He does not have any smokeless tobacco history on file. He reports that he does not drink alcohol or use illicit drugs. family history includes Hypertension in his maternal grandmother and paternal aunt and Stroke in his maternal grandmother. Allergies  Allergen Reactions  . Escitalopram Oxalate   . Quinapril Hcl     REACTION: rash  . Sulfa Antibiotics    Current Outpatient  Prescriptions on File Prior to Visit  Medication Sig Dispense Refill  . allopurinol (ZYLOPRIM) 300 MG tablet TAKE 1 TABLET BY MOUTH EVERY DAY  90 tablet  3  . ALPRAZolam (XANAX) 0.5 MG tablet Take 0.5 mg by mouth. 1/2 - 1 by mouth once daily as needed       . AMLODIPINE BESYLATE PO Take 10 mg by mouth daily.        [name] aspirin 81 MG EC tablet Take 81 mg by mouth daily.        . Bilberry, Vaccinium myrtillus, (BILBERRY EXTRACT PO) Take by mouth daily.        [name] glimepiride (AMARYL) 4 MG tablet Take 4 mg by mouth 2 (two) times daily.        . GuaiFENesin (MUCINEX PO) Take by mouth daily.        . Lactobacillus (DIGESTIVE HEALTH PROBIOTIC PO) Take by mouth daily.        . Lancets MISC Use as directed once daily       . Loratadine (CLARITIN PO) Take by mouth daily.        . metFORMIN (GLUMETZA) 500 MG (MOD) 24 hr tablet Take 500 mg by mouth 3 (three) times daily.        [name] omeprazole (PRILOSEC) 20 MG capsule Take 20 mg by mouth daily.        . tadalafil (CIALIS) 20 MG tablet Take 20 mg by mouth daily as needed.        [name]  Tamsulosin HCl (FLOMAX) 0.4 MG CAPS Take by mouth daily.        [name] telmisartan (MICARDIS) 80 MG tablet Take 80 mg by mouth daily.        . traMADol (ULTRAM) 50 MG tablet Take 50 mg by mouth. 1-2 by mouth every 6 hours as needed for pain       . DISCONTD: Nebivolol HCl (BYSTOLIC) 20 MG TABS Take by mouth daily. 2 by mouth every morning       . Multiple Vitamin (MULTIVITAMIN) tablet Take 1 tablet by mouth daily.        [name] DISCONTD: diltiazem (CARDIZEM LA) 360 MG 24 hr tablet Take 1 tablet (360 mg total) by mouth daily.  30 tablet  11  . DISCONTD: furosemide (LASIX) 40 MG tablet Take 40 mg by mouth daily.        [name] DISCONTD: hydrALAZINE (APRESOLINE) 50 MG tablet Take 100 mg by mouth 2 (two) times daily.        [name] DISCONTD: olmesartan (BENICAR) 40 MG tablet Take 1 tablet (40 mg total) by mouth daily.  90 tablet  3  . DISCONTD: potassium chloride (KLOR-CON 10) 10 MEQ CR tablet Take 10 mEq by  mouth 3 (three) times daily.         Review of Systems Review of Systems  Constitutional: Negative for diaphoresis and unexpected weight change.  HENT: Negative for drooling and tinnitus.   Eyes: Negative for photophobia and visual disturbance.  Respiratory: Negative for choking and stridor.   Gastrointestinal: Negative for vomiting and blood in stool.  Genitourinary: Negative for hematuria and decreased urine volume.     Objective:   Physical Exam BP 140/82  Pulse 63  Temp(Src) 98.8 F (37.1 C) (Oral)  Ht 5\' 8"  (1.727 m)  Wt 224 lb 4 oz (101.719 kg)  BMI 34.10 kg/m2  SpO2 96% Physical Exam  VS noted, mild ill appearing Constitutional: Pt appears well-developed and well-nourished.  HENT: Head: Normocephalic.  Right Ear: External ear normal.  Left Ear: External ear normal. Bilat ear canals with persistent cerumen impactions Bilat tm's mild erythema.  Sinus tender.  Pharynx mild erythema Eyes: Conjunctivae and EOM are normal. Pupils are equal, round, and reactive to light.  Neck: Normal range of motion. Neck supple.  Cardiovascular: Normal rate and regular rhythm.   Pulmonary/Chest: Effort normal and breath sounds normal.  Neurological: Pt is alert. No cranial nerve deficit.  Skin: Skin is warm. No erythema. trace to 1+ edema bilat LE's Psychiatric: Pt behavior is normal. Thought content normal. 1+ nervous         Assessment & Plan:

## 2010-11-28 NOTE — Assessment & Plan Note (Signed)
Much improved on current regimen, Continue all other medications as before, f/u renal as he does

## 2010-11-30 ENCOUNTER — Other Ambulatory Visit: Payer: Self-pay | Admitting: Internal Medicine

## 2010-11-30 LAB — URINE CULTURE: Colony Count: 9000

## 2010-12-04 ENCOUNTER — Other Ambulatory Visit: Payer: Self-pay | Admitting: Internal Medicine

## 2010-12-14 ENCOUNTER — Ambulatory Visit (INDEPENDENT_AMBULATORY_CARE_PROVIDER_SITE_OTHER): Payer: BC Managed Care – PPO | Admitting: General Surgery

## 2010-12-14 DIAGNOSIS — N321 Vesicointestinal fistula: Secondary | ICD-10-CM

## 2010-12-14 NOTE — Patient Instructions (Signed)
Continue dressing changes Lower GI prior to surgery Urology to see prior to surgery

## 2010-12-15 ENCOUNTER — Other Ambulatory Visit: Payer: Self-pay | Admitting: Internal Medicine

## 2010-12-18 NOTE — Progress Notes (Signed)
Subjective:     Patient ID: Nathan Medina, male   DOB: 1950/06/13, 60 y.o.   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 His postoperative course was complicated by some ostomy issues as well as a superficial wound infection. These all seemed to be resolving and his wound is almost completely healed now.  Review of Systems  Constitutional: Negative.   HENT: Negative.   Eyes: Negative.   Respiratory: Negative.   Cardiovascular: Negative.   Gastrointestinal: Negative.   Genitourinary: Positive for dysuria.  Musculoskeletal: Negative.   Skin: Negative.   Neurological: Negative.   Hematological: Negative.   Psychiatric/Behavioral: Negative.    Past Medical History  Diagnosis Date  . HYPERTENSION 06/25/2007  . ALLERGIC RHINITIS 06/25/2007  . ANXIETY 06/25/2007  . COLONIC POLYPS, HX OF 06/25/2007  . DEPRESSION 06/25/2007  . DIABETES MELLITUS, TYPE II 06/25/2007  . Diarrhea of presumed infectious origin 02/14/2010  . DIVERTICULITIS, ACUTE 02/15/2010  . GERD 06/25/2007  . GOUT 06/25/2007  . HYPERLIPIDEMIA 06/25/2007  . IBS 06/25/2007  . 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 PERIPHERAL EDEMA 11/04/2009   Past Surgical History  Procedure Date  . Hernia repair   . Septoplasty    Current outpatient prescriptions:allopurinol (ZYLOPRIM) 300 MG tablet, TAKE 1 TABLET BY MOUTH EVERY DAY, Disp: 90 tablet, Rfl: 3;  ALPRAZolam (XANAX) 0.5 MG tablet, Take 0.5 mg by mouth. 1/2 - 1 by mouth once daily as needed , Disp: , Rfl: ;  AMLODIPINE BESYLATE PO, Take 10 mg by mouth daily.  , Disp: , Rfl: ;  aspirin 81 MG EC tablet, Take 81 mg by mouth daily.  , Disp: , Rfl:  Bilberry, Vaccinium myrtillus, (BILBERRY EXTRACT PO), Take by mouth daily.  , Disp: , Rfl: ;  BYSTOLIC 20 MG TABS, TAKE 1 TABLET BY MOUTH EVERY DAY, Disp: 90 tablet, Rfl: 3;  furosemide (LASIX) 40 MG tablet, TAKE 1  TABLET BY MOUTH TWICE DAILY, Disp: 180 tablet, Rfl: 3;  glimepiride (AMARYL) 4 MG tablet, Take 4 mg by mouth 2 (two) times daily.  , Disp: , Rfl: ;  GuaiFENesin (MUCINEX PO), Take by mouth daily.  , Disp: , Rfl:  hydrALAZINE (APRESOLINE) 50 MG tablet, TAKE 1 TABLET BY MOUTH THREE TIMES DAILY, Disp: 270 tablet, Rfl: 3;  Lactobacillus (DIGESTIVE HEALTH PROBIOTIC PO), Take by mouth daily.  , Disp: , Rfl: ;  Lancets MISC, Use as directed once daily, Disp: 100 each, Rfl: 3;  Loratadine (CLARITIN PO), Take by mouth daily.  , Disp: , Rfl: ;  metFORMIN (GLUMETZA) 500 MG (MOD) 24 hr tablet, Take 500 mg by mouth 3 (three) times daily.  , Disp: , Rfl:  Multiple Vitamin (MULTIVITAMIN) tablet, Take 1 tablet by mouth daily.  , Disp: , Rfl: ;  omeprazole (PRILOSEC) 20 MG capsule, Take 20 mg by mouth daily.  , Disp: , Rfl: ;  Omeprazole 20 MG TBEC, TAKE 1 TABLET BY MOUTH EVERY DAY, Disp: 126 tablet, Rfl: 1;  tadalafil (CIALIS) 20 MG tablet, Take 20 mg by mouth daily as needed.  , Disp: , Rfl: ;  Tamsulosin HCl (FLOMAX) 0.4 MG CAPS, Take by mouth daily.  , Disp: , Rfl:  telmisartan (MICARDIS) 80 MG tablet, Take 80 mg by mouth daily.  , Disp: , Rfl: ;  traMADol (ULTRAM) 50 MG tablet, Take 50 mg by mouth. 1-2 by  mouth every 6 hours as needed for pain , Disp: , Rfl:   Allergies  Allergen Reactions  . Escitalopram Oxalate   . Quinapril Hcl     REACTION: rash  . Sulfa Antibiotics        Objective:   Physical Exam  Constitutional: He is oriented to person, place, and time. He appears well-developed and well-nourished.  HENT:  Head: Normocephalic and atraumatic.  Eyes: Conjunctivae and EOM are normal. Pupils are equal, round, and reactive to light.  Neck: Normal range of motion. Neck supple.  Cardiovascular: Normal rate and regular rhythm.   Pulmonary/Chest: Effort normal and breath sounds normal.  Abdominal: Soft. Bowel sounds are normal.       Small opening at the inferior portion of the wound just large enough  for the tip of a Q-tip to enter. No tunneling  Musculoskeletal: Normal range of motion.  Neurological: He is alert and oriented to person, place, and time.  Skin: Skin is warm and dry.  Psychiatric: He has a normal mood and affect. His behavior is normal.       Assessment:     4 months postop from sigmoid colectomy and colostomy.    Plan:     At this point we will plan to obtain a contrast enema study through his rectum and through the ostomy to evaluate the rest was colon. If this looks normal then I think we can start planning for a return trip to the operating room to reverse the colostomy. I have discussed with him in detail the risks and benefits of the operation to do this as well some of the technical aspects and he understands and wishes to proceed.

## 2010-12-19 ENCOUNTER — Other Ambulatory Visit: Payer: Self-pay | Admitting: Obstetrics and Gynecology

## 2010-12-19 ENCOUNTER — Ambulatory Visit
Admission: RE | Admit: 2010-12-19 | Discharge: 2010-12-19 | Disposition: A | Discharge status: Home / Self Care | Payer: BC Managed Care – PPO | Source: Ambulatory Visit | Attending: General Surgery | Admitting: General Surgery

## 2010-12-19 DIAGNOSIS — N321 Vesicointestinal fistula: Secondary | ICD-10-CM

## 2010-12-22 ENCOUNTER — Telehealth (INDEPENDENT_AMBULATORY_CARE_PROVIDER_SITE_OTHER): Payer: Self-pay | Admitting: General Surgery

## 2010-12-22 NOTE — Telephone Encounter (Signed)
Sounds good

## 2010-12-22 NOTE — Telephone Encounter (Signed)
Verlon Au with [facility] calling to get order to continue daily dressing changes for 3 more weeks on patient's abdominal wound. I told her this should be okay to continue for 3 more weeks and we would call back if this was a problem.

## 2010-12-28 ENCOUNTER — Telehealth (INDEPENDENT_AMBULATORY_CARE_PROVIDER_SITE_OTHER): Payer: Self-pay

## 2010-12-28 NOTE — Telephone Encounter (Signed)
[facility] calling to report that pt's wound measured 2.0 cm today, which is somewhat deeper.  Just wanted to let Dr. Carolynne Edouard know in case he had any other orders/recommendations. Verlon Au at [place] #[id number].

## 2011-01-08 ENCOUNTER — Encounter (INDEPENDENT_AMBULATORY_CARE_PROVIDER_SITE_OTHER): Payer: Self-pay | Admitting: General Surgery

## 2011-01-09 ENCOUNTER — Ambulatory Visit (INDEPENDENT_AMBULATORY_CARE_PROVIDER_SITE_OTHER): Payer: BC Managed Care – PPO | Admitting: General Surgery

## 2011-01-09 ENCOUNTER — Encounter (INDEPENDENT_AMBULATORY_CARE_PROVIDER_SITE_OTHER): Payer: Self-pay | Admitting: General Surgery

## 2011-01-09 DIAGNOSIS — N321 Vesicointestinal fistula: Secondary | ICD-10-CM

## 2011-01-09 NOTE — Patient Instructions (Signed)
Colon prep prior to surgery

## 2011-01-09 NOTE — Progress Notes (Signed)
Subjective:     Patient ID: Nathan Medina, male   DOB: 08-22-1950, 60 y.o.   MRN: [id number]  HPI The patient is a 60 year old white male who is now almost 5 months out from a sigmoid colectomy and colostomy for a perforation. He has done well and is not ready to have his colostomy reversed. He has no complaints today.  Review of Systems  Constitutional: Negative.   HENT: Negative.   Eyes: Negative.   Respiratory: Negative.   Cardiovascular: Negative.   Gastrointestinal: Negative.   Genitourinary: Negative.   Musculoskeletal: Negative.   Skin: Negative.   Neurological: Negative.   Hematological: Negative.   Psychiatric/Behavioral: Negative.    Past Medical History  Diagnosis Date  . HYPERTENSION 06/25/2007  . ALLERGIC RHINITIS 06/25/2007  . ANXIETY 06/25/2007  . COLONIC POLYPS, HX OF 06/25/2007  . DEPRESSION 06/25/2007  . DIABETES MELLITUS, TYPE II 06/25/2007  . Diarrhea of presumed infectious origin 02/14/2010  . DIVERTICULITIS, ACUTE 02/15/2010  . GERD 06/25/2007  . GOUT 06/25/2007  . HYPERLIPIDEMIA 06/25/2007  . IBS 06/25/2007  . 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 PERIPHERAL EDEMA 11/04/2009  . Fatigue   . Chills   . Fever   . Abdominal pain   . Hernia   . Hearing loss   . Wears glasses    Past Surgical History  Procedure Date  . Hernia repair   . Septoplasty    Current outpatient prescriptions:allopurinol (ZYLOPRIM) 300 MG tablet, TAKE 1 TABLET BY MOUTH EVERY DAY, Disp: 90 tablet, Rfl: 3;  ALPRAZolam (XANAX) 0.5 MG tablet, Take 0.5 mg by mouth. 1/2 - 1 by mouth once daily as needed , Disp: , Rfl: ;  AMLODIPINE BESYLATE PO, Take 10 mg by mouth daily.  , Disp: , Rfl: ;  aspirin 81 MG EC tablet, Take 81 mg by mouth daily.  , Disp: , Rfl:  Bilberry, Vaccinium myrtillus, (BILBERRY EXTRACT PO), Take by mouth daily.  , Disp: , Rfl: ;  BYSTOLIC 20 MG TABS, TAKE 1 TABLET BY MOUTH EVERY DAY, Disp: 90 tablet, Rfl: 3;  glimepiride (AMARYL) 4 MG tablet, Take 4 mg by mouth 2 (two) times daily.   , Disp: , Rfl: ;  GuaiFENesin (MUCINEX PO), Take by mouth daily.  , Disp: , Rfl: ;  hydrALAZINE (APRESOLINE) 50 MG tablet, TAKE 1 TABLET BY MOUTH THREE TIMES DAILY, Disp: 270 tablet, Rfl: 3 Lactobacillus (DIGESTIVE HEALTH PROBIOTIC PO), Take by mouth daily.  , Disp: , Rfl: ;  Lancets MISC, Use as directed once daily, Disp: 100 each, Rfl: 3;  Loratadine (CLARITIN PO), Take by mouth daily.  , Disp: , Rfl: ;  metFORMIN (GLUMETZA) 500 MG (MOD) 24 hr tablet, Take 500 mg by mouth 3 (three) times daily.  , Disp: , Rfl: ;  Multiple Vitamin (MULTIVITAMIN) tablet, Take 1 tablet by mouth daily.  , Disp: , Rfl:  omeprazole (PRILOSEC) 20 MG capsule, Take 20 mg by mouth daily.  , Disp: , Rfl: ;  Omeprazole 20 MG TBEC, TAKE 1 TABLET BY MOUTH EVERY DAY, Disp: 126 tablet, Rfl: 1;  potassium chloride 20 MEQ/15ML (10%) SOLN, Take by mouth daily. Confirm frequency of dosage with patient. , Disp: , Rfl: ;  tadalafil (CIALIS) 20 MG tablet, Take 20 mg by mouth daily as needed.  , Disp: , Rfl:  Tamsulosin HCl (FLOMAX) 0.4 MG CAPS, Take by mouth daily.  , Disp: , Rfl: ;  traMADol (ULTRAM) 50 MG tablet, Take 50 mg by mouth. 1-2 by mouth every  6 hours as needed for pain , Disp: , Rfl:   Allergies  Allergen Reactions  . Escitalopram Oxalate   . Quinapril Hcl     REACTION: rash  . Sulfa Antibiotics        Objective:   Physical Exam  Constitutional: He is oriented to person, place, and time. He appears well-developed and well-nourished.  HENT:  Head: Normocephalic and atraumatic.  Eyes: Conjunctivae and EOM are normal. Pupils are equal, round, and reactive to light.  Neck: Normal range of motion. Neck supple.  Cardiovascular: Normal rate, regular rhythm and normal heart sounds.   Pulmonary/Chest: Effort normal and breath sounds normal.  Abdominal: Soft. Bowel sounds are normal.       Midline incision is almost completely healed. He has this very small shallow open area at the inferior portion of the wound. His ostomy is  pink and productive  Musculoskeletal: Normal range of motion.  Neurological: He is alert and oriented to person, place, and time.  Skin: Skin is warm and dry.  Psychiatric: He has a normal mood and affect. His behavior is normal.       Assessment:     Previous colostomy for sigmoid perforation.    Plan:     We will plan to take down the colostomy. I have discussed with him in detail the risks and benefits of the operation to do this as well as some of the technical aspects and he understands and wishes to proceed.

## 2011-02-02 ENCOUNTER — Encounter (HOSPITAL_COMMUNITY)
Admission: RE | Admit: 2011-02-02 | Discharge: 2011-02-02 | Disposition: A | Discharge status: Home / Self Care | Payer: BC Managed Care – PPO | Source: Ambulatory Visit | Attending: General Surgery | Admitting: General Surgery

## 2011-02-02 LAB — CBC
HCT: 43.7 % (ref 39.0–52.0)
Hemoglobin: 15.4 g/dL (ref 13.0–17.0)
MCH: 31.2 pg (ref 26.0–34.0)
MCHC: 35.2 g/dL (ref 30.0–36.0)
MCV: 88.6 fL (ref 78.0–100.0)
Platelets: 171 10*3/uL (ref 150–400)
RBC: 4.93 MIL/uL (ref 4.22–5.81)
RDW: 13.9 % (ref 11.5–15.5)
WBC: 8.7 10*3/uL (ref 4.0–10.5)

## 2011-02-02 LAB — DIFFERENTIAL
Basophils Absolute: 0.1 10*3/uL (ref 0.0–0.1)
Basophils Relative: 1 % (ref 0–1)
Eosinophils Absolute: 0.4 10*3/uL (ref 0.0–0.7)
Eosinophils Relative: 4 % (ref 0–5)
Lymphocytes Relative: 29 % (ref 12–46)
Lymphs Abs: 2.5 10*3/uL (ref 0.7–4.0)
Monocytes Absolute: 0.7 10*3/uL (ref 0.1–1.0)
Monocytes Relative: 8 % (ref 3–12)
Neutro Abs: 5 10*3/uL (ref 1.7–7.7)
Neutrophils Relative %: 58 % (ref 43–77)

## 2011-02-02 LAB — BASIC METABOLIC PANEL
BUN: 20 mg/dL (ref 6–23)
CO2: 32 mEq/L (ref 19–32)
Calcium: 10.1 mg/dL (ref 8.4–10.5)
Chloride: 104 mEq/L (ref 96–112)
Creatinine, Ser: 1.06 mg/dL (ref 0.50–1.35)
GFR calc Af Amer: >60 mL/min (ref >60)
GFR calc non Af Amer: >60 mL/min (ref >60)
Glucose, Bld: 140 mg/dL — ABNORMAL HIGH (ref 70–99)
Potassium: 3.7 mEq/L (ref 3.5–5.1)
Sodium: 143 mEq/L (ref 135–145)

## 2011-02-02 LAB — SURGICAL PCR SCREEN
MRSA, PCR: NEGATIVE
Staphylococcus aureus: POSITIVE — AB

## 2011-02-03 ENCOUNTER — Other Ambulatory Visit: Payer: Self-pay | Admitting: Internal Medicine

## 2011-02-09 ENCOUNTER — Encounter: Payer: Self-pay | Admitting: Endocrinology

## 2011-02-09 ENCOUNTER — Ambulatory Visit (INDEPENDENT_AMBULATORY_CARE_PROVIDER_SITE_OTHER): Payer: BC Managed Care – PPO | Admitting: Endocrinology

## 2011-02-09 VITALS — BP 132/86 | HR 65 | Temp 98.0°F | Ht 68.0 in | Wt 226.2 lb

## 2011-02-09 DIAGNOSIS — S0190XA Unspecified open wound of unspecified part of head, initial encounter: Secondary | ICD-10-CM

## 2011-02-09 NOTE — Progress Notes (Signed)
Subjective:    Patient ID: Nathan Medina, male    DOB: 01-Jun-1950, 60 y.o.   MRN: [id number]  HPI Pt says at 9 this am, he slipped on wet leaves at home, striking his head on the foundation of his house.  Since then, he has a few hrs of mod pain at the right frontal and maxillary areas.   Past Medical History  Diagnosis Date  . HYPERTENSION 06/25/2007  . ALLERGIC RHINITIS 06/25/2007  . ANXIETY 06/25/2007  . COLONIC POLYPS, HX OF 06/25/2007  . DEPRESSION 06/25/2007  . DIABETES MELLITUS, TYPE II 06/25/2007  . Diarrhea of presumed infectious origin 02/14/2010  . DIVERTICULITIS, ACUTE 02/15/2010  . GERD 06/25/2007  . GOUT 06/25/2007  . HYPERLIPIDEMIA 06/25/2007  . IBS 06/25/2007  . 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 PERIPHERAL EDEMA 11/04/2009  . Fatigue   . Chills   . Fever   . Abdominal pain   . Hernia   . Hearing loss   . Wears glasses     Past Surgical History  Procedure Date  . Hernia repair   . Septoplasty     History   Social History  . Marital Status: Single    Spouse Name: N/A    Number of Children: N/A  . Years of Education: N/A   Occupational History  . Not on file.   Social History Main Topics  . Smoking status: Never Smoker   . Smokeless tobacco: Not on file  . Alcohol Use: No  . Drug Use: No  . Sexually Active:    Other Topics Concern  . Not on file   Social History Narrative  . No narrative on file    Current Outpatient Prescriptions on File Prior to Visit  Medication Sig Dispense Refill  . allopurinol (ZYLOPRIM) 300 MG tablet TAKE 1 TABLET BY MOUTH EVERY DAY  90 tablet  3  . ALPRAZolam (XANAX) 0.5 MG tablet Take 0.5 mg by mouth. 1/2 - 1 by mouth once daily as needed       . AMLODIPINE BESYLATE PO Take 10 mg by mouth daily.        [name] aspirin 81 MG EC tablet Take 81 mg by mouth daily.        . Bilberry, Vaccinium myrtillus, (BILBERRY EXTRACT PO) Take by mouth daily.        [name] BYSTOLIC 20 MG TABS TAKE 1 TABLET BY MOUTH EVERY DAY  90 tablet  3  . glimepiride  (AMARYL) 4 MG tablet TAKE 2 TABLETS BY MOUTH EVERY MORNING  180 tablet  0  . GuaiFENesin (MUCINEX PO) Take by mouth daily.        . Lancets MISC Use as directed once daily  100 each  3  . Loratadine (CLARITIN PO) Take by mouth daily.        . metFORMIN (GLUCOPHAGE-XR) 500 MG 24 hr tablet TAKE 3 TABLETS BY MOUTH DAILY  270 tablet  0  . Multiple Vitamin (MULTIVITAMIN) tablet Take 1 tablet by mouth daily.        . Omeprazole 20 MG TBEC TAKE 1 TABLET BY MOUTH EVERY DAY  126 tablet  1  . potassium chloride 20 MEQ/15ML (10%) SOLN Take by mouth daily. Confirm frequency of dosage with patient.       . tadalafil (CIALIS) 20 MG tablet Take 20 mg by mouth daily as needed.        . traMADol (ULTRAM) 50 MG tablet Take 50 mg by mouth. 1-2 by mouth every  6 hours as needed for pain         Allergies  Allergen Reactions  . Escitalopram Oxalate   . Quinapril Hcl     REACTION: rash  . Sulfa Antibiotics     Family History  Problem Relation Age of Onset  . Hypertension Paternal Aunt   . Stroke Maternal Grandmother   . Hypertension Maternal Grandmother     BP 132/86  Pulse 65  Temp(Src) 98 F (36.7 C) (Oral)  Ht 5\' 8"  (1.727 m)  Wt 226 lb 3.2 oz (102.604 kg)  BMI 34.39 kg/m2  SpO2 97%    Review of Systems Denies loc and visual loss    Objective:   Physical Exam VITAL SIGNS:  See vs page GENERAL: no distress head: no deformity eyes: no periorbital swelling, no proptosis external nose and ears are normal mouth: no lesion seen Right eac and tm is normal Right frontal area: 2 cm longitudonal laceration, closed.  No bleeding.  Steri-strip is in place Right maxillary area: small abrasion Left ant thigh: 8 cm abrasion and contusion.  No ecchymosis Left wrist: minimal abrasion.   Cn 2-12 grossly intact Neck: supple Gait: normal and steady       Assessment & Plan:  Laceration, small too old to suture.  New.

## 2011-02-09 NOTE — Patient Instructions (Signed)
Keep the area on your right forehead clean, and covered with antibiotic ointment, and a large bandaid. The bruised area on your left thigh will probably become "black and blue."   Take it easy for a few days, then increase activity slowly. I hope you feel better soon.  If you don't feel better by next week, please call doctor Jonny Ruiz.

## 2011-02-12 ENCOUNTER — Other Ambulatory Visit (INDEPENDENT_AMBULATORY_CARE_PROVIDER_SITE_OTHER): Payer: Self-pay | Admitting: General Surgery

## 2011-02-12 ENCOUNTER — Inpatient Hospital Stay (HOSPITAL_COMMUNITY)
Admission: RE | Admit: 2011-02-12 | Discharge: 2011-02-21 | DRG: 149 | Disposition: A | Discharge status: Home Health | Payer: BC Managed Care – PPO | Source: Ambulatory Visit | Attending: General Surgery | Admitting: General Surgery

## 2011-02-12 DIAGNOSIS — Z882 Allergy status to sulfonamides status: Secondary | ICD-10-CM

## 2011-02-12 DIAGNOSIS — Z433 Encounter for attention to colostomy: Secondary | ICD-10-CM

## 2011-02-12 DIAGNOSIS — E119 Type 2 diabetes mellitus without complications: Secondary | ICD-10-CM | POA: Diagnosis present

## 2011-02-12 DIAGNOSIS — K660 Peritoneal adhesions (postprocedural) (postinfection): Secondary | ICD-10-CM | POA: Diagnosis present

## 2011-02-12 DIAGNOSIS — Z79899 Other long term (current) drug therapy: Secondary | ICD-10-CM

## 2011-02-12 DIAGNOSIS — K219 Gastro-esophageal reflux disease without esophagitis: Secondary | ICD-10-CM | POA: Diagnosis present

## 2011-02-12 DIAGNOSIS — K389 Disease of appendix, unspecified: Secondary | ICD-10-CM

## 2011-02-12 DIAGNOSIS — I10 Essential (primary) hypertension: Secondary | ICD-10-CM | POA: Diagnosis present

## 2011-02-12 HISTORY — PX: APPENDECTOMY: SHX54

## 2011-02-12 HISTORY — PX: COLON SURGERY: SHX602

## 2011-02-12 LAB — GLUCOSE, CAPILLARY
Glucose-Capillary: 127 mg/dL — ABNORMAL HIGH (ref 70–99)
Glucose-Capillary: 182 mg/dL — ABNORMAL HIGH (ref 70–99)

## 2011-02-13 DIAGNOSIS — E119 Type 2 diabetes mellitus without complications: Secondary | ICD-10-CM

## 2011-02-13 LAB — GLUCOSE, CAPILLARY
Glucose-Capillary: 120 mg/dL — ABNORMAL HIGH (ref 70–99)
Glucose-Capillary: 129 mg/dL — ABNORMAL HIGH (ref 70–99)
Glucose-Capillary: 139 mg/dL — ABNORMAL HIGH (ref 70–99)
Glucose-Capillary: 149 mg/dL — ABNORMAL HIGH (ref 70–99)
Glucose-Capillary: 160 mg/dL — ABNORMAL HIGH (ref 70–99)
Glucose-Capillary: 194 mg/dL — ABNORMAL HIGH (ref 70–99)

## 2011-02-13 LAB — CBC
HCT: 39.7 % (ref 39.0–52.0)
Hemoglobin: 13.3 g/dL (ref 13.0–17.0)
MCH: 29.9 pg (ref 26.0–34.0)
MCHC: 33.5 g/dL (ref 30.0–36.0)
MCV: 89.2 fL (ref 78.0–100.0)
Platelets: 160 10*3/uL (ref 150–400)
RBC: 4.45 MIL/uL (ref 4.22–5.81)
RDW: 13.9 % (ref 11.5–15.5)
WBC: 10.9 10*3/uL — ABNORMAL HIGH (ref 4.0–10.5)

## 2011-02-13 LAB — DIFFERENTIAL
Basophils Absolute: 0 10*3/uL (ref 0.0–0.1)
Basophils Relative: 0 % (ref 0–1)
Eosinophils Absolute: 0 10*3/uL (ref 0.0–0.7)
Eosinophils Relative: 0 % (ref 0–5)
Lymphocytes Relative: 16 % (ref 12–46)
Lymphs Abs: 1.7 10*3/uL (ref 0.7–4.0)
Monocytes Absolute: 1.4 10*3/uL — ABNORMAL HIGH (ref 0.1–1.0)
Monocytes Relative: 12 % (ref 3–12)
Neutro Abs: 7.8 10*3/uL — ABNORMAL HIGH (ref 1.7–7.7)
Neutrophils Relative %: 72 % (ref 43–77)

## 2011-02-13 LAB — BASIC METABOLIC PANEL
BUN: 24 mg/dL — ABNORMAL HIGH (ref 6–23)
CO2: 28 mEq/L (ref 19–32)
Calcium: 8.6 mg/dL (ref 8.4–10.5)
Chloride: 107 mEq/L (ref 96–112)
Creatinine, Ser: 1.89 mg/dL — ABNORMAL HIGH (ref 0.50–1.35)
GFR calc Af Amer: 44 mL/min — ABNORMAL LOW (ref >60)
GFR calc non Af Amer: 37 mL/min — ABNORMAL LOW (ref >60)
Glucose, Bld: 128 mg/dL — ABNORMAL HIGH (ref 70–99)
Potassium: 3.8 mEq/L (ref 3.5–5.1)
Sodium: 143 mEq/L (ref 135–145)

## 2011-02-14 DIAGNOSIS — I10 Essential (primary) hypertension: Secondary | ICD-10-CM

## 2011-02-14 LAB — DIFFERENTIAL
Basophils Absolute: 0 10*3/uL (ref 0.0–0.1)
Basophils Relative: 0 % (ref 0–1)
Eosinophils Absolute: 0 10*3/uL (ref 0.0–0.7)
Eosinophils Relative: 0 % (ref 0–5)
Lymphocytes Relative: 14 % (ref 12–46)
Lymphs Abs: 1.8 10*3/uL (ref 0.7–4.0)
Monocytes Absolute: 1.6 10*3/uL — ABNORMAL HIGH (ref 0.1–1.0)
Monocytes Relative: 12 % (ref 3–12)
Neutro Abs: 9.9 10*3/uL — ABNORMAL HIGH (ref 1.7–7.7)
Neutrophils Relative %: 74 % (ref 43–77)

## 2011-02-14 LAB — CBC
HCT: 40.4 % (ref 39.0–52.0)
Hemoglobin: 13.5 g/dL (ref 13.0–17.0)
MCH: 30.5 pg (ref 26.0–34.0)
MCHC: 33.4 g/dL (ref 30.0–36.0)
MCV: 91.2 fL (ref 78.0–100.0)
Platelets: 146 10*3/uL — ABNORMAL LOW (ref 150–400)
RBC: 4.43 MIL/uL (ref 4.22–5.81)
RDW: 13.9 % (ref 11.5–15.5)
WBC: 13.5 10*3/uL — ABNORMAL HIGH (ref 4.0–10.5)

## 2011-02-14 LAB — GLUCOSE, CAPILLARY
Glucose-Capillary: 116 mg/dL — ABNORMAL HIGH (ref 70–99)
Glucose-Capillary: 132 mg/dL — ABNORMAL HIGH (ref 70–99)
Glucose-Capillary: 137 mg/dL — ABNORMAL HIGH (ref 70–99)
Glucose-Capillary: 142 mg/dL — ABNORMAL HIGH (ref 70–99)
Glucose-Capillary: 150 mg/dL — ABNORMAL HIGH (ref 70–99)
Glucose-Capillary: 158 mg/dL — ABNORMAL HIGH (ref 70–99)

## 2011-02-14 LAB — BASIC METABOLIC PANEL
BUN: 20 mg/dL (ref 6–23)
CO2: 24 mEq/L (ref 19–32)
Calcium: 8.9 mg/dL (ref 8.4–10.5)
Chloride: 110 mEq/L (ref 96–112)
Creatinine, Ser: 1.48 mg/dL — ABNORMAL HIGH (ref 0.50–1.35)
GFR calc Af Amer: 59 mL/min — ABNORMAL LOW (ref >60)
GFR calc non Af Amer: 48 mL/min — ABNORMAL LOW (ref >60)
Glucose, Bld: 135 mg/dL — ABNORMAL HIGH (ref 70–99)
Potassium: 3.8 mEq/L (ref 3.5–5.1)
Sodium: 144 mEq/L (ref 135–145)

## 2011-02-15 LAB — GLUCOSE, CAPILLARY
Glucose-Capillary: 126 mg/dL — ABNORMAL HIGH (ref 70–99)
Glucose-Capillary: 127 mg/dL — ABNORMAL HIGH (ref 70–99)
Glucose-Capillary: 128 mg/dL — ABNORMAL HIGH (ref 70–99)
Glucose-Capillary: 128 mg/dL — ABNORMAL HIGH (ref 70–99)
Glucose-Capillary: 133 mg/dL — ABNORMAL HIGH (ref 70–99)
Glucose-Capillary: 135 mg/dL — ABNORMAL HIGH (ref 70–99)
Glucose-Capillary: 147 mg/dL — ABNORMAL HIGH (ref 70–99)

## 2011-02-15 NOTE — Op Note (Addendum)
NAMEBOOKER, BHATNAGAR NO.:  [id number]  MEDICAL RECORD NO.:  [id number]  LOCATION:  5128                         FACILITY:  [facility]  PHYSICIAN:  Ollen Gross. Vernell Morgans, M.D. 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  PROCEDURES: 1. Exploratory laparotomy. 2. Lysis of adhesions. 3. Colostomy takedown with partial colectomy. 4. Colorectal anastomosis. 5. Appendectomy.  SURGEON:  Ollen Gross. Vernell Morgans, MD  ASSISTANTS:  Wilmon Arms. Corliss Skains, MD, Almond Lint, MD, and Mary Sella. Andrey Campanile, MD  ANESTHESIA:  General endotracheal.  DESCRIPTION OF PROCEDURE:  After informed consent was obtained, the patient was brought to the operating room, placed in the supine position on the operating table.  After adequate induction of general anesthesia, the patient was placed in lithotomy position.  His abdomen, perineum, and perirectal area were all prepped with combination of Betadine and ChloraPrep, allowed to dry, and then draped in usual sterile manner. Prior to prepping, the ostomy was closed with a 2-0 silk pursestring stitch.  A midline incision was made in a fashion to remove his old scar with a 10-blade knife.  This incision was carried down through the skin and subcutaneous tissue sharply with the electrocautery until the fascia of the anterior abdominal wall was encountered.  The fascia of the anterior abdominal wall was also opened sharply with the electrocautery. The preperitoneal space were then probed bluntly with the hemostat.  The peritoneum was opened and access was gained to the abdominal cavity. The rest of the incision was then opened under direct vision.  There were some omental adhesions to the anterior abdominal wall which were taken  down sharply with the electrocautery.  There were some interloop adhesions between loops of small bowel and colon that were also taken down sharply with the cautery.  A Bookwalter retractor was deployed. There were some adhesions around the rectal stump which were dissected free by some blunt finger dissection and some sharp dissection with the electrocautery.  We were then able to identify the rectal stump.  There were a couple of Prolene stitches in the rectal stump that helped [place] identify.  The rectal stump although healthy-appearing was still fairly thick and was doubtful that a stapling device would be adequate way to anastomose to the rectal stump.  Once this area was free, we then in the process of the freeing up the rectal stump and identifying the appendix was also stuck to the stump of the rectal or to the staple line of the rectal stump and we had to take the appendix off it.  Because of this, we then dissected out the mesoappendix sharply with the electrocautery. We clamped the vessel to the appendix with a Kelly clamp, divided and ligated with a 2-0 silk tie.  We then placed GIA-75 stapler across the base of the appendix where it joined with cecum clamped and fired, thereby dividing the appendix between staple lines and removing it.  The staple line was healthy and hemostatic.  Next, an elliptical incision was made around the old ostomy with a 10-blade knife.  This incision was carried down through the skin and subcutaneous tissue sharply with the electrocautery.  We dissected the ostomy out until we reached the inside of the abdominal cavity and some free space.  We then brought the ostomy inside the abdominal cavity and dissected the rest of the ostomy free. This was also done sharply with the electrocautery.  The last few centimeters of the ostomy were then resected, so that the colon that was brought down to make the anastomosis was healthy with good blood supply, but the  mesentery to the resected portion of the colon was taken down by clamping the vessels with Kelly clamps, dividing, and ligating with 2-0 silk ties.  Allen clamp was placed on the colon on the stay side, where it appeared to be healthy and the distal colon was excised sharply with a 10-blade knife.  Next, there was good length on this proximal colon and easily reached down in to the pelvis.  Next, we excised the staple line of the rectal stump sharply with the electrocautery.  The rectal stump appeared to be healthy, also with good blood supply.  We then created an end-to-end anastomosis with full-thickness interrupted 2-0 silk stitches circumferentially around the wall of the colon.  Once we were satisfied with the anastomosis, we then performed a rigid sigmoidoscopy and insufflated the lower colon with air.  We did this with saline in the pelvis and there was no evidence of bubbling or air leak at all.  The anastomosis looked good without tension.  At this point, we then irrigated the abdomen with copious amounts of saline. The ostomy hole was closed in 2 layers of interrupted #1 Novafil.  The fascia of the anterior abdominal wall was then closed with 2 running #1 double-stranded loop PDS sutures.  The subcutaneous tissues were irrigated with copious amounts of saline and Betadine and skin was closed with staples.  Skin vacuum dressing with Adaptic and vacuum sponge were then applied to the anterior abdominal wall.  Prior to closing, we also placed 2 On-Q catheters on each side of the abdominal wall for pain control.  This was done without difficulty.  At the end of the case, all needle, sponge, and instrument counts were correct.  The patient tolerated the procedure well and the patient was then awakened and taken to recovery in stable condition.     Ollen Gross. Vernell Morgans, M.D.     PST/MEDQ  D:  02/13/2011  T:  02/13/2011  Job:  [id number]  Electronically Signed by Chevis Pretty III M.D. on  02/15/2011 09:51:52 AM Electronically Signed by Chevis Pretty III M.D. on 02/15/2011 09:53:23 AM

## 2011-02-16 LAB — GLUCOSE, CAPILLARY
Glucose-Capillary: 142 mg/dL — ABNORMAL HIGH (ref 70–99)
Glucose-Capillary: 130 mg/dL — ABNORMAL HIGH (ref 70–99)
Glucose-Capillary: 137 mg/dL — ABNORMAL HIGH (ref 70–99)
Glucose-Capillary: 112 mg/dL — ABNORMAL HIGH (ref 70–99)
Glucose-Capillary: 183 mg/dL — ABNORMAL HIGH (ref 70–99)

## 2011-02-17 LAB — CBC
HCT: 38.8 % — ABNORMAL LOW (ref 39.0–52.0)
Hemoglobin: 12.9 g/dL — ABNORMAL LOW (ref 13.0–17.0)
MCH: 29.9 pg (ref 26.0–34.0)
MCHC: 33.2 g/dL (ref 30.0–36.0)
MCV: 89.8 fL (ref 78.0–100.0)
Platelets: 209 10*3/uL (ref 150–400)
RBC: 4.32 MIL/uL (ref 4.22–5.81)
RDW: 13.5 % (ref 11.5–15.5)
WBC: 11.3 10*3/uL — ABNORMAL HIGH (ref 4.0–10.5)

## 2011-02-17 LAB — DIFFERENTIAL
Basophils Absolute: 0.1 10*3/uL (ref 0.0–0.1)
Basophils Relative: 0 % (ref 0–1)
Eosinophils Absolute: 0.3 10*3/uL (ref 0.0–0.7)
Eosinophils Relative: 3 % (ref 0–5)
Lymphocytes Relative: 17 % (ref 12–46)
Lymphs Abs: 1.9 10*3/uL (ref 0.7–4.0)
Monocytes Absolute: 1.1 10*3/uL — ABNORMAL HIGH (ref 0.1–1.0)
Monocytes Relative: 10 % (ref 3–12)
Neutro Abs: 7.9 10*3/uL — ABNORMAL HIGH (ref 1.7–7.7)
Neutrophils Relative %: 70 % (ref 43–77)

## 2011-02-17 LAB — BASIC METABOLIC PANEL
BUN: 20 mg/dL (ref 6–23)
CO2: 28 mEq/L (ref 19–32)
Calcium: 9 mg/dL (ref 8.4–10.5)
Chloride: 104 mEq/L (ref 96–112)
Creatinine, Ser: 1.2 mg/dL (ref 0.50–1.35)
GFR calc Af Amer: >60 mL/min (ref >60)
GFR calc non Af Amer: >60 mL/min (ref >60)
Glucose, Bld: 130 mg/dL — ABNORMAL HIGH (ref 70–99)
Potassium: 2.8 mEq/L — ABNORMAL LOW (ref 3.5–5.1)
Sodium: 142 mEq/L (ref 135–145)

## 2011-02-17 LAB — GLUCOSE, CAPILLARY
Glucose-Capillary: 131 mg/dL — ABNORMAL HIGH (ref 70–99)
Glucose-Capillary: 135 mg/dL — ABNORMAL HIGH (ref 70–99)
Glucose-Capillary: 139 mg/dL — ABNORMAL HIGH (ref 70–99)
Glucose-Capillary: 139 mg/dL — ABNORMAL HIGH (ref 70–99)
Glucose-Capillary: 147 mg/dL — ABNORMAL HIGH (ref 70–99)
Glucose-Capillary: 195 mg/dL — ABNORMAL HIGH (ref 70–99)

## 2011-02-18 LAB — GLUCOSE, CAPILLARY
Glucose-Capillary: 132 mg/dL — ABNORMAL HIGH (ref 70–99)
Glucose-Capillary: 133 mg/dL — ABNORMAL HIGH (ref 70–99)
Glucose-Capillary: 134 mg/dL — ABNORMAL HIGH (ref 70–99)
Glucose-Capillary: 139 mg/dL — ABNORMAL HIGH (ref 70–99)
Glucose-Capillary: 145 mg/dL — ABNORMAL HIGH (ref 70–99)
Glucose-Capillary: 147 mg/dL — ABNORMAL HIGH (ref 70–99)

## 2011-02-19 LAB — GLUCOSE, CAPILLARY
Glucose-Capillary: 126 mg/dL — ABNORMAL HIGH (ref 70–99)
Glucose-Capillary: 152 mg/dL — ABNORMAL HIGH (ref 70–99)
Glucose-Capillary: 140 mg/dL — ABNORMAL HIGH (ref 70–99)
Glucose-Capillary: 137 mg/dL — ABNORMAL HIGH (ref 70–99)

## 2011-02-20 ENCOUNTER — Inpatient Hospital Stay (HOSPITAL_COMMUNITY): Payer: BC Managed Care – PPO

## 2011-02-20 LAB — BASIC METABOLIC PANEL
BUN: 18 mg/dL (ref 6–23)
CO2: 29 mEq/L (ref 19–32)
Calcium: 9 mg/dL (ref 8.4–10.5)
Chloride: 103 mEq/L (ref 96–112)
Creatinine, Ser: 1.22 mg/dL (ref 0.50–1.35)
GFR calc Af Amer: 73 mL/min — ABNORMAL LOW (ref >90)
GFR calc non Af Amer: 63 mL/min — ABNORMAL LOW (ref >90)
Glucose, Bld: 165 mg/dL — ABNORMAL HIGH (ref 70–99)
Potassium: 2.6 mEq/L — CL (ref 3.5–5.1)
Sodium: 140 mEq/L (ref 135–145)

## 2011-02-20 LAB — GLUCOSE, CAPILLARY
Glucose-Capillary: 122 mg/dL — ABNORMAL HIGH (ref 70–99)
Glucose-Capillary: 148 mg/dL — ABNORMAL HIGH (ref 70–99)
Glucose-Capillary: 146 mg/dL — ABNORMAL HIGH (ref 70–99)
Glucose-Capillary: 152 mg/dL — ABNORMAL HIGH (ref 70–99)

## 2011-02-20 LAB — URIC ACID: Uric Acid, Serum: 6.7 mg/dL (ref 4.0–7.8)

## 2011-02-21 LAB — BASIC METABOLIC PANEL
BUN: 18 mg/dL (ref 6–23)
CO2: 29 mEq/L (ref 19–32)
Calcium: 8.9 mg/dL (ref 8.4–10.5)
Chloride: 103 mEq/L (ref 96–112)
Creatinine, Ser: 1.4 mg/dL — ABNORMAL HIGH (ref 0.50–1.35)
GFR calc Af Amer: 62 mL/min — ABNORMAL LOW (ref >90)
GFR calc non Af Amer: 53 mL/min — ABNORMAL LOW (ref >90)
Glucose, Bld: 116 mg/dL — ABNORMAL HIGH (ref 70–99)
Potassium: 2.9 mEq/L — ABNORMAL LOW (ref 3.5–5.1)
Sodium: 141 mEq/L (ref 135–145)

## 2011-02-21 LAB — CBC
HCT: 35.2 % — ABNORMAL LOW (ref 39.0–52.0)
Hemoglobin: 11.8 g/dL — ABNORMAL LOW (ref 13.0–17.0)
MCH: 30.6 pg (ref 26.0–34.0)
MCHC: 33.5 g/dL (ref 30.0–36.0)
MCV: 91.2 fL (ref 78.0–100.0)
Platelets: 227 10*3/uL (ref 150–400)
RBC: 3.86 MIL/uL — ABNORMAL LOW (ref 4.22–5.81)
RDW: 13.9 % (ref 11.5–15.5)
WBC: 10.1 10*3/uL (ref 4.0–10.5)

## 2011-02-21 LAB — GLUCOSE, CAPILLARY
Glucose-Capillary: 121 mg/dL — ABNORMAL HIGH (ref 70–99)
Glucose-Capillary: 181 mg/dL — ABNORMAL HIGH (ref 70–99)

## 2011-02-28 ENCOUNTER — Ambulatory Visit (INDEPENDENT_AMBULATORY_CARE_PROVIDER_SITE_OTHER): Payer: BC Managed Care – PPO | Admitting: General Surgery

## 2011-02-28 ENCOUNTER — Encounter (INDEPENDENT_AMBULATORY_CARE_PROVIDER_SITE_OTHER): Payer: Self-pay | Admitting: General Surgery

## 2011-02-28 VITALS — Temp 98.4°F

## 2011-02-28 DIAGNOSIS — Z933 Colostomy status: Secondary | ICD-10-CM

## 2011-02-28 NOTE — Progress Notes (Signed)
MR Nathan Medina HERE FOR WOUND CHECK AND STAPLE REMOVAL. ALL STAPLES REMOVED WITHOUT DIFFICULTY AND STERI STRIPS APPLIED/ PACKING REMOVED FROM ABDOMINAL WOUND 4X4 GUAZE. WOUND BED PINK AND MODERATE AMOUNT OLD BLOODY DRAINAGE ON PACKING. WOUND CLEANED WITH STEERILE NORMAL SALINE AND REPACKED WITH STERILE 4X4 GAUZE AND COVERED WITH 4X4 GAUZE AND ABD PAD. PT HAS F/U WITH DR. TOTH NEXT WEEK.

## 2011-03-06 ENCOUNTER — Encounter (INDEPENDENT_AMBULATORY_CARE_PROVIDER_SITE_OTHER): Payer: Self-pay | Admitting: General Surgery

## 2011-03-06 ENCOUNTER — Ambulatory Visit (INDEPENDENT_AMBULATORY_CARE_PROVIDER_SITE_OTHER): Payer: BC Managed Care – PPO | Admitting: General Surgery

## 2011-03-06 VITALS — BP 156/98 | HR 56 | Temp 97.2°F | Resp 16 | Ht 68.0 in | Wt 209.8 lb

## 2011-03-06 DIAGNOSIS — T8149XA Infection following a procedure, other surgical site, initial encounter: Secondary | ICD-10-CM

## 2011-03-06 DIAGNOSIS — T8140XA Infection following a procedure, unspecified, initial encounter: Secondary | ICD-10-CM

## 2011-03-06 NOTE — Patient Instructions (Signed)
Continue daily showering and dressing changes 

## 2011-03-08 ENCOUNTER — Encounter (INDEPENDENT_AMBULATORY_CARE_PROVIDER_SITE_OTHER): Payer: Self-pay | Admitting: General Surgery

## 2011-03-08 NOTE — Progress Notes (Signed)
Subjective:     Patient ID: Nathan Medina, male   DOB: 28-Jun-1950, 60 y.o.   MRN: [id number]  HPI The patient is a 60 year old white male who is now about 3 weeks out from a colostomy takedown. His postoperative course was complicated by a superficial wound infection and requiring opening of the skin incision. He has been getting dressing changes at home and things seemed to be doing reasonably well. He still has some abdominal soreness. He also complains of not having much energy or appetite. His bowels are moving normally. He denies any fevers or chills.  Review of Systems     Objective:   Physical Exam On exam his abdomen is soft with minimal tenderness. The central portion of his midline incision is open with some moderate drainage but there is decent granulation tissue. He also has some serous type drainage from a small opening at the superior portion of the wound. This was opened and packed with gauze and seems fairly clean. The ostomy site is healing nicely with no sign of infection.    Assessment:     3 weeks postop from a colostomy takedown    Plan:     At this point we will continue with daily dressing changes and showers to irrigate the wound. He will continue to work on his p.o. intake. He will continue to refrain from strenuous activity. We will plan to see him back in another 2 or 3 weeks to check the wound.

## 2011-03-13 ENCOUNTER — Telehealth (INDEPENDENT_AMBULATORY_CARE_PROVIDER_SITE_OTHER): Payer: Self-pay | Admitting: General Surgery

## 2011-03-13 NOTE — Telephone Encounter (Signed)
PER DR. TOTH I CALLED [facility] TO BE SEEN TOMORROW IN URGENT OFFICE. PT AWARE.

## 2011-03-13 NOTE — Telephone Encounter (Signed)
Can he come see an urgent doc soon?

## 2011-03-13 NOTE — Telephone Encounter (Signed)
Nurse with [facility] called stating the top of the wound that was opened last week is now deeper than last visit and draining thick yellow drainage. Went from 4.2 cm to 5.0 cm. Having chills. No fever, was at 97.5 but overall not feeling well. Please advise.

## 2011-03-14 ENCOUNTER — Other Ambulatory Visit (INDEPENDENT_AMBULATORY_CARE_PROVIDER_SITE_OTHER): Payer: Self-pay | Admitting: Surgery

## 2011-03-14 ENCOUNTER — Ambulatory Visit (INDEPENDENT_AMBULATORY_CARE_PROVIDER_SITE_OTHER): Payer: BC Managed Care – PPO | Admitting: Surgery

## 2011-03-14 ENCOUNTER — Encounter (INDEPENDENT_AMBULATORY_CARE_PROVIDER_SITE_OTHER): Payer: Self-pay | Admitting: Surgery

## 2011-03-14 VITALS — BP 134/84 | HR 60 | Temp 98.6°F | Resp 20 | Ht 68.0 in | Wt 210.2 lb

## 2011-03-14 DIAGNOSIS — T8140XA Infection following a procedure, unspecified, initial encounter: Secondary | ICD-10-CM

## 2011-03-14 DIAGNOSIS — T8149XA Infection following a procedure, other surgical site, initial encounter: Secondary | ICD-10-CM

## 2011-03-14 MED ORDER — CIPROFLOXACIN HCL 500 MG PO TABS: 500.0000 mg | ORAL_TABLET | Freq: Two times a day (BID) | ORAL | Status: AC

## 2011-03-14 NOTE — Progress Notes (Signed)
Chief complaint: More drainage from incision  History of present illness the patient is status post a takedown of a colostomy one month ago. He had both the upper and lower ends of the incision opened because of a wound infection. He has been doing dressing changes with the home health care team. He has not been having any fever. He does note some mild constipation. He is not have any significant abdominal pain. However he has had increased drainage from the top of the two open areas. It is very foul-smelling  Exam: Gen.: The patient is alert but somewhat fatigued.  Abdomen: The abdomen is soft and basically benign. There is some granulation tissue around the lower and to the incision that has been opened. Upon probing that I don't find any deeper cavity. There is not any significant drainage. However at the upper open area is copious foul-smelling somewhat greenish drainage. It probes to about 6 cm deep. It tracts underneath the incision.  Impression: Wound infection incompletely drained at the top end.  Plan: I think this needs to be opened a little bit more. I discussed that with the patient and he was agreeable. I anesthetized the area with 1% Xylocaine with epi. I waited about 10 minutes and then opened the incision about 3 cm inferiorly. I was able to probe it was my finger and it seems to the fascia I can feel the sutures. I think this is now well-drained. It was packed with a 4 x 4.  I'm going to start him on some Cipro 500 b.i.d. We have a culture pending. He is scheduled to see Dr. Carolynne Edouard next week if he has any other questions or problems before that he will call us or come back.

## 2011-03-14 NOTE — Patient Instructions (Signed)
Continue with your dressing changes as you have been doing.  Start your antibiotics today.  Call if you have any problems or new symptoms

## 2011-03-18 LAB — WOUND CULTURE: Gram Stain: NONE SEEN

## 2011-03-19 ENCOUNTER — Ambulatory Visit (INDEPENDENT_AMBULATORY_CARE_PROVIDER_SITE_OTHER): Payer: BC Managed Care – PPO | Admitting: General Surgery

## 2011-03-19 ENCOUNTER — Encounter (INDEPENDENT_AMBULATORY_CARE_PROVIDER_SITE_OTHER): Payer: Self-pay | Admitting: General Surgery

## 2011-03-19 VITALS — BP 142/90 | HR 68 | Temp 97.6°F | Resp 14 | Ht 68.0 in | Wt 213.8 lb

## 2011-03-19 DIAGNOSIS — T8140XA Infection following a procedure, unspecified, initial encounter: Secondary | ICD-10-CM

## 2011-03-19 DIAGNOSIS — T8149XA Infection following a procedure, other surgical site, initial encounter: Secondary | ICD-10-CM

## 2011-03-19 NOTE — Patient Instructions (Signed)
Continue daily shower and dressing change 

## 2011-03-20 ENCOUNTER — Encounter (INDEPENDENT_AMBULATORY_CARE_PROVIDER_SITE_OTHER): Payer: Self-pay | Admitting: General Surgery

## 2011-03-20 NOTE — Progress Notes (Signed)
Subjective:     Patient ID: Nathan Medina, male   DOB: 1951-05-11, 60 y.o.   MRN: [id number]  HPI The patient is a 60 year old white male who is now about a month out from a colostomy takedown. His postoperative course has been complicated by a superficial wound infection both inferiorly and superiorly. He has been getting dressing changes by home health nurses. He states that he just doesn't feel well today. He's had some low-grade fevers at home. His appetite has been good and his bowels been moving regularly. He denies any abdominal pain.  Review of Systems     Objective:   Physical Exam On exam his abdomen is soft and nontender. The lower portion of the midline incision that is open is healing nicely and is clean. The upper portion of the wound that is open and still has some soupiness and fibrinous exudate. There is no discharge that looks like stool or succus.    Assessment:     One month status post colostomy takedown complicated by a superficial wound infection    Plan:     Because there is some question of a foul odor to the discharge from the wound it would be reasonable to get a CT scan at least with oral contrast to try to discern whether there could be any fistula although clinically I think there is not. We will plan to see him back in about 2 weeks.

## 2011-03-22 ENCOUNTER — Telehealth (INDEPENDENT_AMBULATORY_CARE_PROVIDER_SITE_OTHER): Payer: Self-pay

## 2011-03-22 ENCOUNTER — Ambulatory Visit
Admission: RE | Admit: 2011-03-22 | Discharge: 2011-03-22 | Disposition: A | Discharge status: Home / Self Care | Payer: BC Managed Care – PPO | Source: Ambulatory Visit | Attending: General Surgery | Admitting: General Surgery

## 2011-03-22 DIAGNOSIS — T8149XA Infection following a procedure, other surgical site, initial encounter: Secondary | ICD-10-CM

## 2011-03-22 MED ORDER — IOHEXOL 300 MG/ML  SOLN
100.0000 mL | Freq: Once | INTRAMUSCULAR | Status: AC | PRN
  Administered 2011-03-22: 100 mL via INTRAVENOUS

## 2011-03-22 NOTE — Telephone Encounter (Signed)
[facility] called with results from CT.  Pt has multiple abscesses.  Called Dr. Abbey Chatters who is on call for [facility].  If pt is not sick, febrile, nauseated he needs to be seen in the office tomorrow by Dr. Carolynne Edouard or one of his partners.  Pt is aware and will be close to his phone tomorrow morning waiting for our call.

## 2011-03-23 NOTE — Discharge Summary (Signed)
  NAMESPURGEON, GANCARZ NO.:  [id number]  MEDICAL RECORD NO.:  [id number]  LOCATION:  5128                         FACILITY:  [facility]  PHYSICIAN:  Ollen Gross. Vernell Morgans, M.D. DATE OF BIRTH:  1950-10-08  DATE OF ADMISSION:  02/12/2011 DATE OF DISCHARGE:  02/21/2011                              DISCHARGE SUMMARY   Mr. Knee is a 60 year old white male who was brought in on September 24 for a colostomy takedown.  He tolerated the operation well. Postoperatively, his pain was controlled with an On-Q pump and a PCA. His diabetes was managed with sliding scale insulin.  He had problems with muscle spasms and so his Foley was left in until postop day #5.  He did have an NG tube in place as well that was removed after couple days. Once he was able to tolerate clears, we started to restarted him on his antihypertensive meds.  He had squeezers for VTE prophylaxis and he was started on subcu heparin.  He gradually improved, but his improvement was very slow.  He also developed some knee pain for which an Orthopedic consult was obtained and he was also noted to have some drainage from his midline wound and on February 20, 2011, the wound was opened and dressings were started.  Home Health was consulted for home health dressing changes, and on October 3 he was ready for discharge home.  His meds, he was given a prescription for pain medicine.  He was to resume his home meds.  ACTIVITIES:  No heavy lifting.  DIET:  As tolerated.  FINAL DIAGNOSIS:  Colostomy reversal and Home Health will be changing his dressing on a daily basis with wet-to-dry gauze and follow up with Dr. Carolynne Edouard in a week.     Ollen Gross. Vernell Morgans, M.D.     PST/MEDQ  D:  03/21/2011  T:  03/21/2011  Job:  [id number]  Electronically Signed by Chevis Pretty III M.D. on 03/23/2011 07:32:58 AM

## 2011-03-27 ENCOUNTER — Ambulatory Visit (INDEPENDENT_AMBULATORY_CARE_PROVIDER_SITE_OTHER): Payer: BC Managed Care – PPO | Admitting: General Surgery

## 2011-03-27 ENCOUNTER — Encounter (INDEPENDENT_AMBULATORY_CARE_PROVIDER_SITE_OTHER): Payer: Self-pay | Admitting: General Surgery

## 2011-03-27 VITALS — BP 140/90 | HR 60 | Temp 98.6°F | Resp 16 | Ht 68.0 in | Wt 216.0 lb

## 2011-03-27 DIAGNOSIS — T8140XA Infection following a procedure, unspecified, initial encounter: Secondary | ICD-10-CM

## 2011-03-27 DIAGNOSIS — T8149XA Infection following a procedure, other surgical site, initial encounter: Secondary | ICD-10-CM

## 2011-03-27 NOTE — Progress Notes (Signed)
Subjective:     Patient ID: Nathan Medina, male   DOB: [birth year], 60 y.o.   MRN: [id number]  HPI The patient is a 60 year old white male who is now about 6 weeks out from a colostomy takedown. His postoperative course has been complicated by a superficial wound infection. He continues to have a lot of drainage from the upper wound. His appetite is improving. His bowels are working normally.Since his last visit we had him undergo a CT scan that showed no evidence of fistula and only a superficial wound infection in the subcutaneous tissue  Review of Systems     Objective:   Physical Exam On exam his abdomen is soft and nontender. The upper portion of his incision is open with moderate purulence drainage. We repacked this wound today. He tolerated this well. I think the home health nurses are probably not getting the packing deep enough.The lower wound is very shallow and clean.    Assessment:     6 weeks status post colostomy takedown complicated by superficial wound infection    Plan:     We will ask him to home care if they can come out and do his dressing change twice a day. We will also ask them to try to pack the upper wound a little bit deeper. I will see him back in one week for another wound check.

## 2011-03-27 NOTE — Patient Instructions (Signed)
Will try to get home health to come out twice a day for next couple weeks Will ask them to try to pack upper wound a little deeper

## 2011-03-28 ENCOUNTER — Telehealth: Payer: Self-pay

## 2011-03-28 DIAGNOSIS — Z1289 Encounter for screening for malignant neoplasm of other sites: Secondary | ICD-10-CM

## 2011-03-28 DIAGNOSIS — Z0000 Encounter for general adult medical examination without abnormal findings: Secondary | ICD-10-CM

## 2011-03-28 NOTE — Telephone Encounter (Signed)
Put order in for physical labs. 

## 2011-03-29 ENCOUNTER — Other Ambulatory Visit: Payer: Self-pay | Admitting: Internal Medicine

## 2011-03-29 ENCOUNTER — Other Ambulatory Visit (INDEPENDENT_AMBULATORY_CARE_PROVIDER_SITE_OTHER): Payer: BC Managed Care – PPO

## 2011-03-29 DIAGNOSIS — Z0000 Encounter for general adult medical examination without abnormal findings: Secondary | ICD-10-CM

## 2011-03-29 DIAGNOSIS — E119 Type 2 diabetes mellitus without complications: Secondary | ICD-10-CM

## 2011-03-29 LAB — CBC WITH DIFFERENTIAL/PLATELET
Basophils Absolute: 0.1 10*3/uL (ref 0.0–0.1)
Basophils Relative: 0.6 % (ref 0.0–3.0)
Eosinophils Absolute: 0.4 10*3/uL (ref 0.0–0.7)
Eosinophils Relative: 4.3 % (ref 0.0–5.0)
HCT: 40 % (ref 39.0–52.0)
Hemoglobin: 13.4 g/dL (ref 13.0–17.0)
Lymphocytes Relative: 29.7 % (ref 12.0–46.0)
Lymphs Abs: 2.5 10*3/uL (ref 0.7–4.0)
MCHC: 33.5 g/dL (ref 30.0–36.0)
MCV: 90.4 fl (ref 78.0–100.0)
Monocytes Absolute: 0.9 10*3/uL (ref 0.1–1.0)
Monocytes Relative: 10.2 % (ref 3.0–12.0)
Neutro Abs: 4.7 10*3/uL (ref 1.4–7.7)
Neutrophils Relative %: 55.2 % (ref 43.0–77.0)
Platelets: 220 10*3/uL (ref 150.0–400.0)
RBC: 4.43 Mil/uL (ref 4.22–5.81)
RDW: 13.9 % (ref 11.5–14.6)
WBC: 8.5 10*3/uL (ref 4.5–10.5)

## 2011-03-29 LAB — HEPATIC FUNCTION PANEL
ALT: 77 U/L — ABNORMAL HIGH (ref 0–53)
AST: 42 U/L — ABNORMAL HIGH (ref 0–37)
Albumin: 3.3 g/dL — ABNORMAL LOW (ref 3.5–5.2)
Alkaline Phosphatase: 135 U/L — ABNORMAL HIGH (ref 39–117)
Bilirubin, Direct: 0.2 mg/dL (ref 0.0–0.3)
Total Bilirubin: 0.7 mg/dL (ref 0.3–1.2)
Total Protein: 6.3 g/dL (ref 6.0–8.3)

## 2011-03-29 LAB — BASIC METABOLIC PANEL
BUN: 17 mg/dL (ref 6–23)
CO2: 27 mEq/L (ref 19–32)
Calcium: 9.1 mg/dL (ref 8.4–10.5)
Chloride: 103 mEq/L (ref 96–112)
Creatinine, Ser: 1 mg/dL (ref 0.4–1.5)
GFR: 82.66 mL/min (ref >60.00)
Glucose, Bld: 121 mg/dL — ABNORMAL HIGH (ref 70–99)
Potassium: 3.6 mEq/L (ref 3.5–5.1)
Sodium: 141 mEq/L (ref 135–145)

## 2011-03-29 LAB — URINALYSIS, ROUTINE W REFLEX MICROSCOPIC
Bilirubin Urine: NEGATIVE
Hgb urine dipstick: NEGATIVE
Ketones, ur: NEGATIVE
Nitrite: NEGATIVE
Specific Gravity, Urine: 1.02 (ref 1.000–1.030)
Total Protein, Urine: NEGATIVE
Urine Glucose: NEGATIVE
Urobilinogen, UA: 0.2 (ref 0.0–1.0)
pH: 5.5 (ref 5.0–8.0)

## 2011-03-29 LAB — TSH: TSH: 3.37 u[IU]/mL (ref 0.35–5.50)

## 2011-03-29 LAB — PSA: PSA: 2.76 ng/mL (ref 0.10–4.00)

## 2011-03-29 LAB — LIPID PANEL
Cholesterol: 145 mg/dL (ref 0–200)
HDL: 29.8 mg/dL — ABNORMAL LOW (ref >39.00)
Total CHOL/HDL Ratio: 5
Triglycerides: 345 mg/dL — ABNORMAL HIGH (ref 0.0–149.0)
VLDL: 69 mg/dL — ABNORMAL HIGH (ref 0.0–40.0)

## 2011-03-29 LAB — MICROALBUMIN / CREATININE URINE RATIO
Creatinine,U: 113.6 mg/dL
Microalb Creat Ratio: 6.2 mg/g (ref 0.0–30.0)
Microalb, Ur: 7 mg/dL — ABNORMAL HIGH (ref 0.0–1.9)

## 2011-03-29 LAB — LDL CHOLESTEROL, DIRECT: Direct LDL: 81.1 mg/dL

## 2011-03-29 LAB — HEMOGLOBIN A1C: Hgb A1c MFr Bld: 6 % (ref 4.6–6.5)

## 2011-04-02 ENCOUNTER — Encounter: Payer: Self-pay | Admitting: Internal Medicine

## 2011-04-02 ENCOUNTER — Ambulatory Visit (INDEPENDENT_AMBULATORY_CARE_PROVIDER_SITE_OTHER): Payer: BC Managed Care – PPO | Admitting: Internal Medicine

## 2011-04-02 VITALS — BP 122/78 | HR 61 | Temp 98.2°F | Ht 68.0 in | Wt 213.1 lb

## 2011-04-02 DIAGNOSIS — Z0000 Encounter for general adult medical examination without abnormal findings: Secondary | ICD-10-CM

## 2011-04-02 DIAGNOSIS — E785 Hyperlipidemia, unspecified: Secondary | ICD-10-CM

## 2011-04-02 DIAGNOSIS — E119 Type 2 diabetes mellitus without complications: Secondary | ICD-10-CM

## 2011-04-02 DIAGNOSIS — Z23 Encounter for immunization: Secondary | ICD-10-CM

## 2011-04-02 DIAGNOSIS — R21 Rash and other nonspecific skin eruption: Secondary | ICD-10-CM | POA: Insufficient documentation

## 2011-04-02 DIAGNOSIS — R609 Edema, unspecified: Secondary | ICD-10-CM

## 2011-04-02 DIAGNOSIS — I10 Essential (primary) hypertension: Secondary | ICD-10-CM

## 2011-04-02 DIAGNOSIS — F411 Generalized anxiety disorder: Secondary | ICD-10-CM

## 2011-04-02 MED ORDER — GLUCOSE BLOOD VI STRP: ORAL_STRIP | Status: DC

## 2011-04-02 MED ORDER — LANCETS MISC: 1.0000 "application " | Freq: Every day | Status: DC

## 2011-04-02 MED ORDER — PREDNISONE 10 MG PO TABS: 10.0000 mg | ORAL_TABLET | Freq: Every day | ORAL | Status: AC

## 2011-04-02 MED ORDER — TRIAMCINOLONE ACETONIDE 0.1 % EX CREA: TOPICAL_CREAM | Freq: Two times a day (BID) | CUTANEOUS | Status: DC

## 2011-04-02 MED ORDER — ALPRAZOLAM 0.5 MG PO TABS: 0.5000 mg | ORAL_TABLET | Freq: Every day | ORAL | Status: DC | PRN

## 2011-04-02 NOTE — Assessment & Plan Note (Signed)
With mild elev TG due to better diet post op in thepast 2 wks, advised to follow low fat, low chol/DM diet

## 2011-04-02 NOTE — Assessment & Plan Note (Signed)
stable overall by hx and exam, most recent data reviewed with pt, and pt to continue medical treatment as before  Lab Results  Component Value Date   HGBA1C 6.0 03/29/2011

## 2011-04-02 NOTE — Progress Notes (Signed)
Subjective:    Patient ID: Nathan Medina, male    DOB: [birth year], 60 y.o.   MRN: [id number]  HPI  Here for wellness and f/u;  Overall doing ok;  Pt denies CP, worsening SOB, DOE, wheezing, orthopnea, PND, worsening LE edema, palpitations, dizziness or syncope.  Pt denies neurological change such as new Headache, facial or extremity weakness.  Pt denies polydipsia, polyuria, or low sugar symptoms. Pt states overall good compliance with treatment and medications, good tolerability, and trying to follow lower cholesterol diet.  Pt denies worsening depressive symptoms, suicidal ideation or panic. No fever, wt loss, night sweats, loss of appetite, or other constitutional symptoms.  Pt states good ability with ADL's, low fall risk, home safety reviewed and adequate, no significant changes in hearing or vision, and occasionally active with exercise. Just s/p surgury per Dr Carolynne Edouard about oct 1, now with better appetite just this past wk, and starting to drive again.  Has f/u appts with Dr Deterding/renal and Dr Tannenbaum/urology soon.  Does have itchy rash to legs and abdomen new onset in the past 2 wks, hard to stop scratching, no fever or pain. Past Medical History  Diagnosis Date  . HYPERTENSION 06/25/2007  . ALLERGIC RHINITIS 06/25/2007  . ANXIETY 06/25/2007  . COLONIC POLYPS, HX OF 06/25/2007  . DEPRESSION 06/25/2007  . DIABETES MELLITUS, TYPE II 06/25/2007  . Diarrhea of presumed infectious origin 02/14/2010  . DIVERTICULITIS, ACUTE 02/15/2010  . GERD 06/25/2007  . GOUT 06/25/2007  . HYPERLIPIDEMIA 06/25/2007  . IBS 06/25/2007  . 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 PERIPHERAL EDEMA 11/04/2009  . Fatigue   . Chills   . Fever   . Abdominal pain   . Hernia   . Hearing loss   . Wears glasses   . Anemia    Past Surgical History  Procedure Date  . Hernia repair   . Septoplasty   . Appendectomy 02/12/11  . Colon surgery 08/11/10    sigmoid colectomy  . Colon surgery 02/12/11    colostomy takedown    reports  that he has never smoked. He has never used smokeless tobacco. He reports that he does not drink alcohol or use illicit drugs. family history includes Hypertension in his maternal grandmother and paternal aunt and Stroke in his maternal grandmother. Allergies  Allergen Reactions  . Escitalopram Oxalate     Patient unsure of the reaction.  . Quinapril Hcl     REACTION: rash  . Sulfa Antibiotics Itching    All over the body.   Review of Systems Review of Systems  Constitutional: Negative for diaphoresis, activity change, appetite change and unexpected weight change.  HENT: Negative for hearing loss, ear pain, facial swelling, mouth sores and neck stiffness.   Eyes: Negative for pain, redness and visual disturbance.  Respiratory: Negative for shortness of breath and wheezing.   Cardiovascular: Negative for chest pain and palpitations.  Gastrointestinal: Negative for diarrhea, blood in stool, abdominal distention and rectal pain.  Genitourinary: Negative for hematuria, flank pain and decreased urine volume.  Musculoskeletal: Negative for myalgias and joint swelling.  Skin: Negative for color change and wound.  Neurological: Negative for syncope and numbness.  Hematological: Negative for adenopathy.  Psychiatric/Behavioral: Negative for hallucinations, self-injury, decreased concentration and agitation.      Objective:   Physical Exam BP 122/78  Pulse 61  Temp(Src) 98.2 F (36.8 C) (Oral)  Ht 5\' 8"  (1.727 m)  Wt 213 lb 2 oz (96.673 kg)  BMI 32.41 kg/m2  SpO2 96% Physical Exam  VS noted Constitutional: Pt is oriented to person, place, and time. Appears well-developed and well-nourished.  HENT:  Head: Normocephalic and atraumatic.  Right Ear: External ear normal.  Left Ear: External ear normal.  Nose: Nose normal.  Mouth/Throat: Oropharynx is clear and moist.  Eyes: Conjunctivae and EOM are normal. Pupils are equal, round, and reactive to light.  Neck: Normal range of motion.  Neck supple. No JVD present. No tracheal deviation present.  Cardiovascular: Normal rate, regular rhythm, normal heart sounds and intact distal pulses.   Pulmonary/Chest: Effort normal and breath sounds normal.  Abdominal: Soft. Bowel sounds are normal. There is no tenderness.  Musculoskeletal: Normal range of motion. Exhibits trace -1+ bilat LE edema, has diffuse ? Eczematous rash to LE and abd  Lymphadenopathy:  Has no cervical adenopathy.  Neurological: Pt is alert and oriented to person, place, and time. Pt has normal reflexes. No cranial nerve deficit.  Skin: Skin is warm and dry. No rash noted.  Psychiatric:  Has  normal mood and affect. Behavior is normal. 1+ nervous    Assessment & Plan:

## 2011-04-02 NOTE — Progress Notes (Signed)
Addended by: Scharlene Gloss B on: 04/02/2011 10:25 AM   Modules accepted: Orders

## 2011-04-02 NOTE — Assessment & Plan Note (Signed)
stable overall by hx and exam, most recent data reviewed with pt, and pt to continue medical treatment as before le Lab Results  Component Value Date   WBC 8.5 03/29/2011   HGB 13.4 03/29/2011   HCT 40.0 03/29/2011   PLT 220.0 03/29/2011   GLUCOSE 121* 03/29/2011   CHOL 145 03/29/2011   TRIG 345.0* 03/29/2011   HDL 29.80* 03/29/2011   LDLDIRECT 81.1 03/29/2011   LDLCALC 32 04/03/2010   ALT 77* 03/29/2011   AST 42* 03/29/2011   NA 141 03/29/2011   K 3.6 03/29/2011   CL 103 03/29/2011   CREATININE 1.0 03/29/2011   BUN 17 03/29/2011   CO2 27 03/29/2011   TSH 3.37 03/29/2011   PSA 2.76 03/29/2011   HGBA1C 6.0 03/29/2011   MICROALBUR 7.0* 03/29/2011

## 2011-04-02 NOTE — Patient Instructions (Signed)
You had the shingles shot today You are given the glucometer and supplies for it today Take all new medications as prescribed  - the prednisone and steroid cream Continue all other medications as before, including the alprazolam as needed (new script today) Please keep your appointments with your specialists as you have planned - Dr Nadene Rubins and Deterding Please watch for low sugars at night especially as your sugar now is very good, and consider taking Half of the glimeparide for the evening dose if this occurs regularly (otherwise ok to continue the medication as before) Please return in 6 mo with Lab testing done 3-5 days before

## 2011-04-02 NOTE — Assessment & Plan Note (Signed)
I suspect allergic type - for predpack, and steroid cream prn, consider derm eval

## 2011-04-02 NOTE — Assessment & Plan Note (Signed)
stable overall by hx and exam, most recent data reviewed with pt, and pt to continue medical treatment as before, to cont f/u with Dr deterding/renal

## 2011-04-02 NOTE — Assessment & Plan Note (Signed)

## 2011-04-02 NOTE — Assessment & Plan Note (Signed)
Mild, ? Related to amlodipine 10, to cont same meds for now

## 2011-04-03 ENCOUNTER — Ambulatory Visit (INDEPENDENT_AMBULATORY_CARE_PROVIDER_SITE_OTHER): Payer: BC Managed Care – PPO | Admitting: General Surgery

## 2011-04-03 ENCOUNTER — Encounter (INDEPENDENT_AMBULATORY_CARE_PROVIDER_SITE_OTHER): Payer: Self-pay | Admitting: General Surgery

## 2011-04-03 VITALS — BP 142/86 | HR 60 | Temp 97.6°F | Resp 20 | Ht 68.0 in | Wt 213.4 lb

## 2011-04-03 DIAGNOSIS — T8140XA Infection following a procedure, unspecified, initial encounter: Secondary | ICD-10-CM

## 2011-04-03 DIAGNOSIS — T8149XA Infection following a procedure, other surgical site, initial encounter: Secondary | ICD-10-CM

## 2011-04-03 NOTE — Patient Instructions (Signed)
Continue twice a day dressing changes for 1 more week then resume once a day

## 2011-04-03 NOTE — Progress Notes (Signed)
Subjective:     Patient ID: Nathan Medina, male   DOB: 10/11/1950, 60 y.o.   MRN: [id number]  HPI The patient is a 60 year old white male who is now about month and a half out from a colostomy takedown. His postoperative course was counted about a superficial wound infection. The house nurses have been doing twice a day dressing changes for the last week or so and didn't seem to be improving. He has less smell from the wound. He's had less drainage from the wound.  Review of Systems     Objective:   Physical Exam On exam his abdomen is soft and nontender. The lower incision is very clean and shallow with good granulation tissue. The upper wound is much cleaner with less drainage.    Assessment:     1-1/2 months out from a colostomy takedown.    Plan:     At this point we will continue twice daily dressing changes for another week. I will plan to see him back in about 2 weeks to check the wound again.

## 2011-04-05 ENCOUNTER — Telehealth (INDEPENDENT_AMBULATORY_CARE_PROVIDER_SITE_OTHER): Payer: Self-pay | Admitting: General Surgery

## 2011-04-05 NOTE — Telephone Encounter (Signed)
MESSAGE LEFT ON PHONE FOR PT TO CALL RE APPOINTMENT

## 2011-04-16 ENCOUNTER — Encounter (INDEPENDENT_AMBULATORY_CARE_PROVIDER_SITE_OTHER): Payer: Self-pay | Admitting: General Surgery

## 2011-04-16 ENCOUNTER — Ambulatory Visit (INDEPENDENT_AMBULATORY_CARE_PROVIDER_SITE_OTHER): Payer: BC Managed Care – PPO | Admitting: General Surgery

## 2011-04-16 ENCOUNTER — Encounter (INDEPENDENT_AMBULATORY_CARE_PROVIDER_SITE_OTHER): Payer: BC Managed Care – PPO | Admitting: General Surgery

## 2011-04-16 VITALS — BP 154/94 | HR 76 | Temp 97.8°F | Resp 16 | Ht 68.0 in | Wt 211.4 lb

## 2011-04-16 DIAGNOSIS — T8149XA Infection following a procedure, other surgical site, initial encounter: Secondary | ICD-10-CM

## 2011-04-16 DIAGNOSIS — T8140XA Infection following a procedure, unspecified, initial encounter: Secondary | ICD-10-CM

## 2011-04-16 NOTE — Patient Instructions (Signed)
Continue to shower and change dressing daily 

## 2011-04-16 NOTE — Progress Notes (Signed)
Subjective:     Patient ID: Nathan Medina, male   DOB: 30-Jan-1951, 60 y.o.   MRN: [id number]  HPI The patient is a 60 year old white male who is now about 2 months out from a colostomy reversal. His postoperative course was complicated by a small superficial wound infection. He's been doing dressing changes to the area and thinks and be improving significantly. His appetite is good and his bowels are working normally. He denies any abdominal pain. He denies any fevers or chills.  Review of Systems     Objective:   Physical Exam On exam his abdomen is soft and nontender. The 2 small openings  in the wound are clean with minimal drainage. The inferior one is very shallow. The superior one is much cleaner than the last time we saw it.    Assessment:     2 months status post reversal of a colostomy    Plan:     At this point he can begin doing a little more activity. He will continue to shower and change the dressing daily with packing gauze in the wound. We will see him back in about 3 or 4 weeks.

## 2011-04-30 ENCOUNTER — Other Ambulatory Visit: Payer: Self-pay | Admitting: Internal Medicine

## 2011-05-17 ENCOUNTER — Other Ambulatory Visit: Payer: Self-pay | Admitting: Internal Medicine

## 2011-05-18 ENCOUNTER — Encounter: Payer: Self-pay | Admitting: Internal Medicine

## 2011-05-18 ENCOUNTER — Ambulatory Visit (INDEPENDENT_AMBULATORY_CARE_PROVIDER_SITE_OTHER): Payer: BC Managed Care – PPO | Admitting: Internal Medicine

## 2011-05-18 ENCOUNTER — Other Ambulatory Visit (INDEPENDENT_AMBULATORY_CARE_PROVIDER_SITE_OTHER): Payer: BC Managed Care – PPO

## 2011-05-18 ENCOUNTER — Ambulatory Visit (INDEPENDENT_AMBULATORY_CARE_PROVIDER_SITE_OTHER)
Admission: RE | Admit: 2011-05-18 | Discharge: 2011-05-18 | Disposition: A | Discharge status: Home / Self Care | Payer: BC Managed Care – PPO | Source: Ambulatory Visit | Attending: Internal Medicine | Admitting: Internal Medicine

## 2011-05-18 VITALS — BP 130/72 | HR 70 | Temp 99.5°F

## 2011-05-18 DIAGNOSIS — R509 Fever, unspecified: Secondary | ICD-10-CM

## 2011-05-18 DIAGNOSIS — M5416 Radiculopathy, lumbar region: Secondary | ICD-10-CM

## 2011-05-18 DIAGNOSIS — IMO0002 Reserved for concepts with insufficient information to code with codable children: Secondary | ICD-10-CM

## 2011-05-18 DIAGNOSIS — R059 Cough, unspecified: Secondary | ICD-10-CM

## 2011-05-18 DIAGNOSIS — R300 Dysuria: Secondary | ICD-10-CM

## 2011-05-18 DIAGNOSIS — R05 Cough: Secondary | ICD-10-CM

## 2011-05-18 LAB — PSA: PSA: 4.88 ng/mL — ABNORMAL HIGH (ref 0.10–4.00)

## 2011-05-18 LAB — BASIC METABOLIC PANEL
BUN: 21 mg/dL (ref 6–23)
CO2: 30 mEq/L (ref 19–32)
Calcium: 9.3 mg/dL (ref 8.4–10.5)
Chloride: 101 mEq/L (ref 96–112)
Creatinine, Ser: 1.3 mg/dL (ref 0.4–1.5)
GFR: 59.11 mL/min — ABNORMAL LOW (ref >60.00)
Glucose, Bld: 157 mg/dL — ABNORMAL HIGH (ref 70–99)
Potassium: 3.7 mEq/L (ref 3.5–5.1)
Sodium: 141 mEq/L (ref 135–145)

## 2011-05-18 LAB — CBC WITH DIFFERENTIAL/PLATELET
Basophils Absolute: 0 10*3/uL (ref 0.0–0.1)
Basophils Relative: 0.4 % (ref 0.0–3.0)
Eosinophils Absolute: 0 10*3/uL (ref 0.0–0.7)
Eosinophils Relative: 0.4 % (ref 0.0–5.0)
HCT: 41.2 % (ref 39.0–52.0)
Hemoglobin: 13.9 g/dL (ref 13.0–17.0)
Lymphocytes Relative: 10.3 % — ABNORMAL LOW (ref 12.0–46.0)
Lymphs Abs: 0.8 10*3/uL (ref 0.7–4.0)
MCHC: 33.9 g/dL (ref 30.0–36.0)
MCV: 87 fl (ref 78.0–100.0)
Monocytes Absolute: 1.1 10*3/uL — ABNORMAL HIGH (ref 0.1–1.0)
Monocytes Relative: 13.3 % — ABNORMAL HIGH (ref 3.0–12.0)
Neutro Abs: 6 10*3/uL (ref 1.4–7.7)
Neutrophils Relative %: 75.6 % (ref 43.0–77.0)
Platelets: 179 10*3/uL (ref 150.0–400.0)
RBC: 4.74 Mil/uL (ref 4.22–5.81)
RDW: 14.1 % (ref 11.5–14.6)
WBC: 8 10*3/uL (ref 4.5–10.5)

## 2011-05-18 LAB — URINALYSIS
Bilirubin Urine: NEGATIVE
Hgb urine dipstick: NEGATIVE
Ketones, ur: NEGATIVE
Leukocytes, UA: NEGATIVE
Nitrite: NEGATIVE
Specific Gravity, Urine: 1.02 (ref 1.000–1.030)
Urine Glucose: NEGATIVE
Urobilinogen, UA: 0.2 (ref 0.0–1.0)
pH: 5.5 (ref 5.0–8.0)

## 2011-05-18 LAB — HEPATIC FUNCTION PANEL
ALT: 78 U/L — ABNORMAL HIGH (ref 0–53)
AST: 81 U/L — ABNORMAL HIGH (ref 0–37)
Albumin: 3.6 g/dL (ref 3.5–5.2)
Alkaline Phosphatase: 137 U/L — ABNORMAL HIGH (ref 39–117)
Bilirubin, Direct: 0.2 mg/dL (ref 0.0–0.3)
Total Bilirubin: 0.9 mg/dL (ref 0.3–1.2)
Total Protein: 6.8 g/dL (ref 6.0–8.3)

## 2011-05-18 LAB — SEDIMENTATION RATE: Sed Rate: 36 mm/hr — ABNORMAL HIGH (ref 0–22)

## 2011-05-18 NOTE — Patient Instructions (Signed)
It was good to see you today. We have reviewed your prior records including labs and tests today Test(s) ordered today. Your results will be called to you after review (48-72hours after test completion). If any changes need to be made, you will be notified at that time. Continue tramadol for pain symptoms at night and as needed Please schedule followup in 2 weeks with Dr. Jonny Ruiz to review your symptoms in progress, call sooner if problems.

## 2011-05-18 NOTE — Progress Notes (Signed)
Subjective:    Patient ID: Nathan Medina, male    DOB: 04-04-1951, 60 y.o.   MRN: [id number]  HPI complains of fever and body aches Onset last 24h - associated with mild dry hacking cough Also 48 hours of frequent urination with discomfort typical of fever Denies head congestion, chest congestion, sneezing, headache or recurrent joint swelling Expresses overall concerns about intermittent right hip and leg symptoms ongoing for past 9 months Initial onset of right lower extremity symptoms March 2012 -  Intermittent flares over past few months and has been seen by orthopedics for same> told hip was normal Now in the last 2 weeks progressive constant pain symptoms from lateral hip down posterior thigh calf to arch of foot Feels weak and shaky and right leg but denies falls Denies specific back pain Would like to be checked for Lyme disease due to time in [place] (Lyme endemic region) this fall and possible cat scratch fever  Past Medical History  Diagnosis Date  . HYPERTENSION   . ALLERGIC RHINITIS   . ANXIETY   . COLONIC POLYPS, HX OF   . DEPRESSION   . DIABETES MELLITUS, TYPE II   . GERD   . GOUT   . HYPERLIPIDEMIA   . IBS   . 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  HENT: Negative for neck pain and neck stiffness.   Respiratory: Negative for shortness of breath and wheezing.   Genitourinary: Positive for dysuria.  Neurological: Negative for syncope and numbness.       Objective:   Physical Exam BP 130/72  Pulse 70  Temp(Src) 99.5 F (37.5 C) (Oral)  SpO2 94% Constitutional:  Nontoxic; He appears overweight but well-developed and well-nourished. No distress.  Neck: Normal range of motion. Neck supple. No JVD present. No thyromegaly present.  Cardiovascular: Normal rate, regular  rhythm and normal heart sounds.  No murmur heard. no BLE edema Pulmonary/Chest: Effort normal and breath sounds normal. No respiratory distress. no wheezes.   Musculoskeletal: Back: full range of motion of thoracic and lumbar spine. Non tender to palpation. Negative straight leg raise. DTR's are symmetrically intact. Sensation intact in all dermatomes of the lower extremities. Full strength to manual muscle testing. patient is able to toe walk without difficulty but difficulty with heel walk on the right side and ambulates with careful but overall normal gait. Skin: Skin is warm and dry.  No erythema, rash or ulceration.  Psychiatric: he has a normal mood and affect. behavior is normal. Judgment and thought content normal.    Lab Results  Component Value Date   WBC 8.5 03/29/2011   HGB 13.4 03/29/2011   HCT 40.0 03/29/2011   PLT 220.0 03/29/2011   GLUCOSE 121* 03/29/2011   CHOL 145 03/29/2011   TRIG 345.0* 03/29/2011   HDL 29.80* 03/29/2011   LDLDIRECT 81.1 03/29/2011   LDLCALC 32 04/03/2010   ALT 77* 03/29/2011   AST 42* 03/29/2011   NA 141 03/29/2011   K 3.6 03/29/2011   CL 103 03/29/2011   CREATININE 1.0 03/29/2011   BUN 17 03/29/2011   CO2 27 03/29/2011   TSH 3.37 03/29/2011   PSA 2.76 03/29/2011   HGBA1C 6.0 03/29/2011   MICROALBUR 7.0* 03/29/2011       Assessment & Plan:  Fever and myalgia - suspect viral syndrome - Also new dry  cough in last 24h Dysuria - last 48h -  Right lower extremity pain with symptoms of intermittent right L4-5 radiculopathy  Check screening labs now including UA, Lyme and sedimentation rate Check plain films including chest and lumbar spine Continue tramadol as needed for symptom relief If symptoms worse or unimproved, patient to followup with primary care physician to consider MRI as needed

## 2011-05-23 ENCOUNTER — Encounter: Payer: Self-pay | Admitting: Internal Medicine

## 2011-05-23 ENCOUNTER — Other Ambulatory Visit: Payer: Self-pay | Admitting: Internal Medicine

## 2011-05-23 DIAGNOSIS — R7989 Other specified abnormal findings of blood chemistry: Secondary | ICD-10-CM

## 2011-05-23 DIAGNOSIS — N2889 Other specified disorders of kidney and ureter: Secondary | ICD-10-CM

## 2011-05-23 DIAGNOSIS — R945 Abnormal results of liver function studies: Secondary | ICD-10-CM

## 2011-05-23 HISTORY — DX: Other specified abnormal findings of blood chemistry: R79.89

## 2011-05-23 HISTORY — DX: Other specified disorders of kidney and ureter: N28.89

## 2011-05-23 HISTORY — DX: Abnormal results of liver function studies: R94.5

## 2011-05-24 ENCOUNTER — Other Ambulatory Visit: Payer: Self-pay | Admitting: Internal Medicine

## 2011-05-24 DIAGNOSIS — N2889 Other specified disorders of kidney and ureter: Secondary | ICD-10-CM

## 2011-05-24 LAB — B. BURGDORFI ANTIBODIES BY WB
B burgdorferi IgG Abs (IB): NEGATIVE
B burgdorferi IgM Abs (IB): NEGATIVE

## 2011-05-25 ENCOUNTER — Ambulatory Visit (INDEPENDENT_AMBULATORY_CARE_PROVIDER_SITE_OTHER): Payer: BC Managed Care – PPO | Admitting: General Surgery

## 2011-05-25 ENCOUNTER — Encounter (INDEPENDENT_AMBULATORY_CARE_PROVIDER_SITE_OTHER): Payer: Self-pay | Admitting: General Surgery

## 2011-05-25 DIAGNOSIS — N321 Vesicointestinal fistula: Secondary | ICD-10-CM

## 2011-05-25 NOTE — Patient Instructions (Signed)
No more packing needed, just place gauze over opening Activity as tolerated

## 2011-05-29 ENCOUNTER — Encounter (INDEPENDENT_AMBULATORY_CARE_PROVIDER_SITE_OTHER): Payer: Self-pay | Admitting: General Surgery

## 2011-05-29 NOTE — Progress Notes (Signed)
Subjective:     Patient ID: Nathan Medina, male   DOB: Oct 16, 1950, 61 y.o.   MRN: [id number]  HPI The patient is a 61 year old white male who is now about 3 months out from a colostomy reversal. His postoperative course was complicated by a superficial wound infection. He's been getting dressing changes to this area and has been making gradual improvement. He denies any fevers or chills. His appetite is good and his bowels are working normally.  Review of Systems     Objective:   Physical Exam On exam his abdomen is soft and nontender. His midline incision is almost completely healed. The only small open area to the wound is only about a millimeter or 2 deep. There is no drainage.    Assessment:     3 months status post colostomy reversal complicated by a superficial wound infection    Plan:     At this point the wound was so shallow that I do not believe it will need to be packed anymore. He will continue to shower daily and placed gauze over the area. We will see him back in about one more month check his progress

## 2011-06-01 ENCOUNTER — Ambulatory Visit (INDEPENDENT_AMBULATORY_CARE_PROVIDER_SITE_OTHER): Payer: BC Managed Care – PPO | Admitting: Internal Medicine

## 2011-06-01 ENCOUNTER — Encounter: Payer: Self-pay | Admitting: Internal Medicine

## 2011-06-01 VITALS — BP 140/76 | HR 53 | Temp 97.6°F | Ht 68.0 in | Wt 217.2 lb

## 2011-06-01 DIAGNOSIS — N2889 Other specified disorders of kidney and ureter: Secondary | ICD-10-CM

## 2011-06-01 DIAGNOSIS — IMO0002 Reserved for concepts with insufficient information to code with codable children: Secondary | ICD-10-CM

## 2011-06-01 DIAGNOSIS — R945 Abnormal results of liver function studies: Secondary | ICD-10-CM

## 2011-06-01 DIAGNOSIS — Z0000 Encounter for general adult medical examination without abnormal findings: Secondary | ICD-10-CM

## 2011-06-01 DIAGNOSIS — M5416 Radiculopathy, lumbar region: Secondary | ICD-10-CM

## 2011-06-01 DIAGNOSIS — E119 Type 2 diabetes mellitus without complications: Secondary | ICD-10-CM

## 2011-06-01 DIAGNOSIS — N289 Disorder of kidney and ureter, unspecified: Secondary | ICD-10-CM

## 2011-06-01 DIAGNOSIS — R972 Elevated prostate specific antigen [PSA]: Secondary | ICD-10-CM | POA: Insufficient documentation

## 2011-06-01 DIAGNOSIS — R7989 Other specified abnormal findings of blood chemistry: Secondary | ICD-10-CM

## 2011-06-01 MED ORDER — DOXYCYCLINE HYCLATE 100 MG PO TABS: 100.0000 mg | ORAL_TABLET | Freq: Two times a day (BID) | ORAL | Status: AC

## 2011-06-01 NOTE — Patient Instructions (Signed)
Take all new medications as prescribed Continue all other medications as before Please keep your appointments with your specialists as you have planned - Dr Patsi Sears Please return in Nov 2013 for your yearly visit, or sooner if needed, with Lab testing done 3-5 days before

## 2011-06-02 ENCOUNTER — Other Ambulatory Visit: Payer: BC Managed Care – PPO

## 2011-06-03 ENCOUNTER — Encounter: Payer: Self-pay | Admitting: Internal Medicine

## 2011-06-03 DIAGNOSIS — M5416 Radiculopathy, lumbar region: Secondary | ICD-10-CM

## 2011-06-03 HISTORY — DX: Radiculopathy, lumbar region: M54.16

## 2011-06-03 NOTE — Assessment & Plan Note (Signed)
Will hold on furhter effort at abd MRI per pt reqeust, to f/u with urology soon as planned

## 2011-06-03 NOTE — Assessment & Plan Note (Signed)
stable overall by hx and exam, most recent data reviewed with pt, and pt to continue medical treatment as before  Lab Results  Component Value Date   HGBA1C 6.0 03/29/2011    

## 2011-06-03 NOTE — Assessment & Plan Note (Signed)
I suspect c/w recent infection, for doxy course, with cipro added to allergy list, f/u urology as planned

## 2011-06-03 NOTE — Assessment & Plan Note (Signed)
Benign exam, for hepatitis profile next draw,  to f/u any worsening symptoms or concerns

## 2011-06-03 NOTE — Assessment & Plan Note (Signed)
I rec''d MRI ls spine, but he declines for now, tramadol prn to continue,,  to f/u any worsening symptoms or concerns

## 2011-06-03 NOTE — Progress Notes (Signed)
Subjective:    Patient ID: Nathan Medina, male    DOB: 11-26-1950, 61 y.o.   MRN: [id number]  HPI  Here to f/u after recetnly seen per Dr Felicity Coyer,  Pt decided not to do MRI as he was dissapointed that one MRI with cost assoc would not be done for both the LS spine and the renal, so he put off both.  At any rate has f/u appt with Dr Demetrius Charity soon and plans to f/u for renal lesion, recent elev PSA and hx of fistula as well, though I explained some concern that his planned f/u MRI for the renal has been more than the 6 mo originally planned.   Denies urinary symptoms such as dysuria, frequency, urgency,or hematuria. Still has has right LBP, waist level with intermittent radation to the RLE, mod, better with sitting upright, worse lying flat at night and on his side,  Some ? Worse since last visit in freq and severity since mar 2012 since his surgury, flexeril did help previously, no numbness, weakness or falls, but c/o twitching to the leg occasinoally as well.  Also mentions he thinks a rash last yr in retrospect was liekly due to cipro per urology, and wants to make this noted for the record.  Also with recent URI symtpoms now resolved. Pt denies chest pain, increased sob or doe, wheezing, orthopnea, PND, increased LE swelling, palpitations, dizziness or syncope.   Pt denies polydipsia, polyuria,  Pt states overall good compliance with meds, trying to follow lower cholesterol, diabetic diet, wt overall stable but little exercise however.     Past Medical History  Diagnosis Date  . HYPERTENSION   . ALLERGIC RHINITIS   . ANXIETY   . COLONIC POLYPS, HX OF   . DEPRESSION   . DIABETES MELLITUS, TYPE II   . GERD   . GOUT   . HYPERLIPIDEMIA   . IBS   . 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 Left renal mass 05/23/2011  . Abnormal liver function test 05/23/2011   Past Surgical History  Procedure Date  .  Hernia repair   . Septoplasty   . Appendectomy 02/12/11  . Colon surgery 08/11/10    sigmoid colectomy  . Colon surgery 02/12/11    colostomy takedown    reports that he has never smoked. He has never used smokeless tobacco. He reports that he does not drink alcohol or use illicit drugs. family history includes Hypertension in his maternal grandmother and paternal aunt and Stroke in his maternal grandmother. Allergies  Allergen Reactions  . Escitalopram Oxalate     Patient unsure of the reaction.  . Quinapril Hcl     REACTION: rash  . Ciprofloxacin Rash  . Sulfa Antibiotics Itching    All over the body.   Current Outpatient Prescriptions on File Prior to Visit  Medication Sig Dispense Refill  . allopurinol (ZYLOPRIM) 300 MG tablet TAKE 1 TABLET BY MOUTH EVERY DAY  90 tablet  3  . ALPRAZolam (XANAX) 0.5 MG tablet Take 1 tablet (0.5 mg total) by mouth daily as needed for sleep. 1/2 - 1 by mouth once daily as needed  90 tablet  1  . AMLODIPINE BESYLATE PO Take 10 mg by mouth daily.        [name] aspirin 81 MG EC tablet Take 81 mg by mouth daily.        . Bilberry, Vaccinium myrtillus, (BILBERRY EXTRACT PO)  Take by mouth daily.        [name] BYSTOLIC 20 MG TABS TAKE 1 TABLET BY MOUTH EVERY DAY  90 tablet  3  . furosemide (LASIX) 40 MG tablet 2 tablets in the morning and 1 tablet after lunch       . glimepiride (AMARYL) 4 MG tablet TAKE 2 TABLETS BY MOUTH EVERY MORNING  180 tablet  3  . glucose blood (ONE TOUCH ULTRA TEST) test strip Use as directed 1 per day  100 each  5  . GuaiFENesin (MUCINEX PO) Take by mouth daily.        . hydrALAZINE (APRESOLINE) 50 MG tablet        . Lancets MISC 1 application by Does not apply route daily.  100 each  3  . Loratadine (CLARITIN PO) Take by mouth daily.        . metFORMIN (GLUCOPHAGE-XR) 500 MG 24 hr tablet TAKE 3 TABLETS BY MOUTH DAILY  270 tablet  3  . MICARDIS 80 MG tablet TAKE 1 TABLET BY MOUTH EVERY DAY  90 tablet  3  . Multiple Vitamin (MULTIVITAMIN)  tablet Take 1 tablet by mouth daily.        . Omeprazole 20 MG TBEC TAKE 1 TABLET BY MOUTH EVERY DAY  126 tablet  1  . potassium chloride 20 MEQ/15ML (10%) SOLN Take by mouth daily. Confirm frequency of dosage with patient.       . tadalafil (CIALIS) 20 MG tablet Take 20 mg by mouth daily as needed.        . traMADol (ULTRAM) 50 MG tablet Take 1 tablet (50 mg total) by mouth every 6 (six) hours as needed for pain. Maximum dose= 8 tablets per day  30 tablet  0  . triamcinolone (KENALOG) 0.1 % cream Apply topically 2 (two) times daily.  30 g  1   Review of Systems Review of Systems  Constitutional: Negative for diaphoresis and unexpected weight change.  HENT: Negative for drooling and tinnitus.   Eyes: Negative for photophobia and visual disturbance.  Respiratory: Negative for choking and stridor.   Gastrointestinal: Negative for vomiting and blood in stool.  Genitourinary: Negative for hematuria and decreased urine volume.  Musculoskeletal: Negative for gait problem.    Objective:   Physical Exam BP 140/76  Pulse 53  Temp(Src) 97.6 F (36.4 C) (Oral)  Ht 5\' 8"  (1.727 m)  Wt 217 lb 4 oz (98.544 kg)  BMI 33.03 kg/m2  SpO2 97% Physical Exam  VS noted, not ill appearing Constitutional: Pt appears well-developed and well-nourished.  HENT: Head: Normocephalic.  Right Ear: External ear normal.  Left Ear: External ear normal.  Eyes: Conjunctivae and EOM are normal. Pupils are equal, round, and reactive to light.  Neck: Normal range of motion. Neck supple.  Cardiovascular: Normal rate and regular rhythm.   Pulmonary/Chest: Effort normal and breath sounds normal.  Abd:  Soft, NT, non-distended, + BS Spine nontender Neurological: Pt is alert. No cranial nerve deficit.  motor/sens/dtr intact, gait intact Skin: Skin is warm. No erythema.  Psychiatric: Pt behavior is normal. Thought content normal. 1+nervous    Assessment & Plan:

## 2011-06-26 ENCOUNTER — Ambulatory Visit (INDEPENDENT_AMBULATORY_CARE_PROVIDER_SITE_OTHER): Payer: BC Managed Care – PPO | Admitting: General Surgery

## 2011-06-26 ENCOUNTER — Encounter (INDEPENDENT_AMBULATORY_CARE_PROVIDER_SITE_OTHER): Payer: Self-pay | Admitting: General Surgery

## 2011-06-26 VITALS — BP 158/94 | HR 68 | Temp 97.8°F | Resp 18 | Ht 68.0 in | Wt 219.0 lb

## 2011-06-26 DIAGNOSIS — N321 Vesicointestinal fistula: Secondary | ICD-10-CM

## 2011-06-26 NOTE — Patient Instructions (Signed)
May return to all normal activities 

## 2011-06-26 NOTE — Progress Notes (Signed)
Subjective:     Patient ID: Nathan Medina, male   DOB: 1950/11/30, 61 y.o.   MRN: [id number]  HPI The patient is a 61 year old white male who is now 4 months out from a colostomy takedown. His postoperative course was complicated by a small superficial wound infection. This is now completely healed. He denies any pain. His appetite is good. He has been experiencing some constipation lately.  Review of Systems     Objective:   Physical Exam On exam his abdomen is soft and nontender. His incisions have healed nicely. There is no sign of infection. There is no palpable evidence of hernia.    Assessment:     4 months status post colostomy takedown    Plan:     At this point I believe he can return to all of his normal activities without any restrictions. He also has a moderate-sized sebaceous cyst on his back that he is considering having removed. He will let us know when needed like to schedule that.

## 2011-07-13 ENCOUNTER — Other Ambulatory Visit (HOSPITAL_COMMUNITY): Payer: Self-pay | Admitting: Urology

## 2011-07-13 DIAGNOSIS — R1900 Intra-abdominal and pelvic swelling, mass and lump, unspecified site: Secondary | ICD-10-CM

## 2011-07-20 ENCOUNTER — Ambulatory Visit (HOSPITAL_COMMUNITY)
Admission: RE | Admit: 2011-07-20 | Discharge: 2011-07-20 | Disposition: A | Discharge status: Home / Self Care | Payer: BC Managed Care – PPO | Source: Ambulatory Visit | Attending: Urology | Admitting: Urology

## 2011-07-20 ENCOUNTER — Other Ambulatory Visit (HOSPITAL_COMMUNITY): Payer: Self-pay | Admitting: Urology

## 2011-07-20 DIAGNOSIS — K869 Disease of pancreas, unspecified: Secondary | ICD-10-CM | POA: Insufficient documentation

## 2011-07-20 DIAGNOSIS — R1900 Intra-abdominal and pelvic swelling, mass and lump, unspecified site: Secondary | ICD-10-CM

## 2011-07-20 DIAGNOSIS — K824 Cholesterolosis of gallbladder: Secondary | ICD-10-CM | POA: Insufficient documentation

## 2011-07-20 DIAGNOSIS — N289 Disorder of kidney and ureter, unspecified: Secondary | ICD-10-CM | POA: Insufficient documentation

## 2011-07-20 MED ORDER — GADOBENATE DIMEGLUMINE 529 MG/ML IV SOLN
20.0000 mL | Freq: Once | INTRAVENOUS | Status: AC | PRN
  Administered 2011-07-20: 20 mL via INTRAVENOUS

## 2011-08-03 ENCOUNTER — Ambulatory Visit: Payer: BC Managed Care – PPO | Admitting: Internal Medicine

## 2011-08-20 ENCOUNTER — Other Ambulatory Visit: Payer: Self-pay | Admitting: Internal Medicine

## 2011-10-02 ENCOUNTER — Ambulatory Visit: Payer: BC Managed Care – PPO | Admitting: Internal Medicine

## 2011-10-22 ENCOUNTER — Other Ambulatory Visit (INDEPENDENT_AMBULATORY_CARE_PROVIDER_SITE_OTHER): Payer: Self-pay | Admitting: General Surgery

## 2011-10-22 ENCOUNTER — Ambulatory Visit (INDEPENDENT_AMBULATORY_CARE_PROVIDER_SITE_OTHER): Payer: BC Managed Care – PPO | Admitting: General Surgery

## 2011-10-22 ENCOUNTER — Encounter (INDEPENDENT_AMBULATORY_CARE_PROVIDER_SITE_OTHER): Payer: Self-pay | Admitting: General Surgery

## 2011-10-22 VITALS — BP 153/90 | HR 63 | Temp 98.2°F | Ht 68.0 in | Wt 226.6 lb

## 2011-10-22 DIAGNOSIS — R140 Abdominal distension (gaseous): Secondary | ICD-10-CM

## 2011-10-22 DIAGNOSIS — K439 Ventral hernia without obstruction or gangrene: Secondary | ICD-10-CM

## 2011-10-22 LAB — CREATININE, SERUM: Creat: 1.19 mg/dL (ref 0.50–1.35)

## 2011-10-22 LAB — BUN: BUN: 22 mg/dL (ref 6–23)

## 2011-10-22 NOTE — Progress Notes (Signed)
Subjective:     Patient ID: Nathan Medina, male   DOB: 08/26/1950, 61 y.o.   MRN: [id number]  HPI The patient is a 61 year old white male who is about 7 months out from a colostomy reversal. His postoperative course was complicated by superficial wound infection. Over the last few weeks he has noticed some bulging of his abdominal wall. This is mostly on the right. He has a very heavy sensation associated with it. He denies any nausea or vomiting. He occasionally has constipation but his bowels have been moving regularly.  Review of Systems  Constitutional: Negative.   HENT: Negative.   Eyes: Negative.   Respiratory: Negative.   Cardiovascular: Negative.   Gastrointestinal: Positive for abdominal pain and abdominal distention.  Genitourinary: Negative.   Musculoskeletal: Negative.   Skin: Negative.   Neurological: Negative.   Hematological: Negative.   Psychiatric/Behavioral: Negative.        Objective:   Physical Exam  Constitutional: He is oriented to person, place, and time. He appears well-developed and well-nourished.  HENT:  Head: Normocephalic and atraumatic.  Eyes: Conjunctivae and EOM are normal. Pupils are equal, round, and reactive to light.  Neck: Normal range of motion. Neck supple.  Cardiovascular: Normal rate, regular rhythm and normal heart sounds.   Pulmonary/Chest: Effort normal and breath sounds normal.  Abdominal: Soft. Bowel sounds are normal.       The patient has some general fullness to his right mid abdomen. It is difficult to palpate any sort of fascial defects secondary to his obesity. His abdomen appears nontender. There is no sign of obstruction.  Musculoskeletal: Normal range of motion.  Neurological: He is alert and oriented to person, place, and time.  Skin: Skin is warm and dry.  Psychiatric: He has a normal mood and affect. His behavior is normal.       Assessment:     The patient has some asymmetric fullness of his abdominal wall 7 months  after colostomy reversal. This certainly could be a sign of a ventral hernia. In order to confirm the solid like to obtain a CT scan of his abdomen and pelvis.    Plan:     We will see him back in the next 3 weeks or so to go over the results of the CT scan and then proceed accordingly.

## 2011-10-25 ENCOUNTER — Ambulatory Visit
Admission: RE | Admit: 2011-10-25 | Discharge: 2011-10-25 | Disposition: A | Discharge status: Home / Self Care | Payer: BC Managed Care – PPO | Source: Ambulatory Visit | Attending: General Surgery | Admitting: General Surgery

## 2011-10-25 DIAGNOSIS — K439 Ventral hernia without obstruction or gangrene: Secondary | ICD-10-CM

## 2011-10-25 MED ORDER — IOHEXOL 300 MG/ML  SOLN
125.0000 mL | Freq: Once | INTRAMUSCULAR | Status: AC | PRN
  Administered 2011-10-25: 125 mL via INTRAVENOUS

## 2011-11-05 ENCOUNTER — Encounter (INDEPENDENT_AMBULATORY_CARE_PROVIDER_SITE_OTHER): Payer: Self-pay | Admitting: General Surgery

## 2011-11-05 ENCOUNTER — Ambulatory Visit (INDEPENDENT_AMBULATORY_CARE_PROVIDER_SITE_OTHER): Payer: BC Managed Care – PPO | Admitting: General Surgery

## 2011-11-05 VITALS — BP 148/92 | HR 55 | Temp 97.0°F | Resp 16 | Ht 68.0 in | Wt 226.2 lb

## 2011-11-05 DIAGNOSIS — K439 Ventral hernia without obstruction or gangrene: Secondary | ICD-10-CM

## 2011-11-05 NOTE — Patient Instructions (Signed)
Plan for laparoscopic ventral hernia

## 2011-11-21 ENCOUNTER — Encounter (INDEPENDENT_AMBULATORY_CARE_PROVIDER_SITE_OTHER): Payer: Self-pay | Admitting: General Surgery

## 2011-11-21 NOTE — Progress Notes (Signed)
Subjective:     Patient ID: Nathan Medina, male   DOB: 1951-02-07, 61 y.o.   MRN: [id number]  HPI The patient is a 61 year old white male who is about 7 months out from a colostomy reversal. His postoperative course was complicated by a superficial wound infection. This is now healed. He has been noticing some bulging of his right abdominal wall. This is associated with some discomfort. Since his last visit we had him undergo a CT scan of his abdomen and pelvis which did show some changes of his ventral abdominal wall consistent with a hernia but no obstructed loops of bowel. We have gone over the CT scan with him today. He continues to have some discomfort  of his abdominal wall. There is some nausea but no vomiting. His bowels are moving regularly.  Review of Systems  Constitutional: Negative.   HENT: Negative.   Eyes: Negative.   Respiratory: Negative.   Cardiovascular: Negative.   Gastrointestinal: Positive for abdominal pain and abdominal distention.  Genitourinary: Negative.   Musculoskeletal: Negative.   Skin: Negative.   Neurological: Negative.   Hematological: Negative.   Psychiatric/Behavioral: Negative.        Objective:   Physical Exam  Constitutional: He is oriented to person, place, and time. He appears well-developed and well-nourished.  HENT:  Head: Normocephalic and atraumatic.  Eyes: Conjunctivae and EOM are normal. Pupils are equal, round, and reactive to light.  Neck: Normal range of motion. Neck supple.  Cardiovascular: Normal rate, regular rhythm and normal heart sounds.   Pulmonary/Chest: Effort normal and breath sounds normal.  Abdominal: Soft. Bowel sounds are normal.       There is some bulging of his abdominal wall especially to the right of his incision.  Musculoskeletal: Normal range of motion.  Neurological: He is alert and oriented to person, place, and time.  Skin: Skin is warm and dry.  Psychiatric: He has a normal mood and affect. His behavior  is normal.       Assessment:     7 months status post colostomy reversal now with what appears to be a ventral hernia.    Plan:     Because of the risk of incarceration strangulation and the fact that the hernia will only get larger over time I think he would benefit from having this fixed. I've discussed with him in detail the risks and benefits of the operation to fix the hernia as well as some of the technical aspects including the possibility of doing some of this laparoscopically and do risk of injury to the intestines and he understands. He would like to think about it and he will call us back when he is ready to schedule.

## 2011-12-09 ENCOUNTER — Other Ambulatory Visit: Payer: Self-pay | Admitting: Internal Medicine

## 2011-12-19 ENCOUNTER — Other Ambulatory Visit: Payer: Self-pay | Admitting: Internal Medicine

## 2012-02-26 ENCOUNTER — Other Ambulatory Visit: Payer: Self-pay | Admitting: Internal Medicine

## 2012-04-01 ENCOUNTER — Other Ambulatory Visit (INDEPENDENT_AMBULATORY_CARE_PROVIDER_SITE_OTHER): Payer: BC Managed Care – PPO

## 2012-04-01 DIAGNOSIS — E119 Type 2 diabetes mellitus without complications: Secondary | ICD-10-CM

## 2012-04-01 DIAGNOSIS — E785 Hyperlipidemia, unspecified: Secondary | ICD-10-CM

## 2012-04-01 DIAGNOSIS — Z0000 Encounter for general adult medical examination without abnormal findings: Secondary | ICD-10-CM

## 2012-04-01 LAB — LIPID PANEL
Cholesterol: 190 mg/dL (ref 0–200)
HDL: 27.5 mg/dL — ABNORMAL LOW (ref >39.00)
Total CHOL/HDL Ratio: 7
Triglycerides: 525 mg/dL — ABNORMAL HIGH (ref 0.0–149.0)
VLDL: 105 mg/dL — ABNORMAL HIGH (ref 0.0–40.0)

## 2012-04-01 LAB — CBC WITH DIFFERENTIAL/PLATELET
Basophils Absolute: 0 10*3/uL (ref 0.0–0.1)
Basophils Relative: 0.6 % (ref 0.0–3.0)
Eosinophils Absolute: 0.2 10*3/uL (ref 0.0–0.7)
Eosinophils Relative: 2.4 % (ref 0.0–5.0)
HCT: 44.9 % (ref 39.0–52.0)
Hemoglobin: 15 g/dL (ref 13.0–17.0)
Lymphocytes Relative: 34 % (ref 12.0–46.0)
Lymphs Abs: 2.8 10*3/uL (ref 0.7–4.0)
MCHC: 33.4 g/dL (ref 30.0–36.0)
MCV: 88.5 fl (ref 78.0–100.0)
Monocytes Absolute: 0.6 10*3/uL (ref 0.1–1.0)
Monocytes Relative: 7 % (ref 3.0–12.0)
Neutro Abs: 4.7 10*3/uL (ref 1.4–7.7)
Neutrophils Relative %: 56 % (ref 43.0–77.0)
Platelets: 168 10*3/uL (ref 150.0–400.0)
RBC: 5.07 Mil/uL (ref 4.22–5.81)
RDW: 13.6 % (ref 11.5–14.6)
WBC: 8.3 10*3/uL (ref 4.5–10.5)

## 2012-04-01 LAB — MICROALBUMIN / CREATININE URINE RATIO
Creatinine,U: 86.7 mg/dL
Microalb Creat Ratio: 14.1 mg/g (ref 0.0–30.0)
Microalb, Ur: 12.2 mg/dL — ABNORMAL HIGH (ref 0.0–1.9)

## 2012-04-01 LAB — URINALYSIS, ROUTINE W REFLEX MICROSCOPIC
Bilirubin Urine: NEGATIVE
Hgb urine dipstick: NEGATIVE
Ketones, ur: NEGATIVE
Nitrite: NEGATIVE
Specific Gravity, Urine: 1.01 (ref 1.000–1.030)
Total Protein, Urine: NEGATIVE
Urine Glucose: NEGATIVE
Urobilinogen, UA: 0.2 (ref 0.0–1.0)
pH: 5.5 (ref 5.0–8.0)

## 2012-04-01 LAB — HEPATIC FUNCTION PANEL
ALT: 64 U/L — ABNORMAL HIGH (ref 0–53)
AST: 44 U/L — ABNORMAL HIGH (ref 0–37)
Albumin: 3.4 g/dL — ABNORMAL LOW (ref 3.5–5.2)
Alkaline Phosphatase: 110 U/L (ref 39–117)
Bilirubin, Direct: 0.2 mg/dL (ref 0.0–0.3)
Total Bilirubin: 1.1 mg/dL (ref 0.3–1.2)
Total Protein: 6.7 g/dL (ref 6.0–8.3)

## 2012-04-01 LAB — BASIC METABOLIC PANEL
BUN: 22 mg/dL (ref 6–23)
CO2: 30 mEq/L (ref 19–32)
Calcium: 9.1 mg/dL (ref 8.4–10.5)
Chloride: 102 mEq/L (ref 96–112)
Creatinine, Ser: 1.1 mg/dL (ref 0.4–1.5)
GFR: 72.87 mL/min (ref >60.00)
Glucose, Bld: 197 mg/dL — ABNORMAL HIGH (ref 70–99)
Potassium: 4.1 mEq/L (ref 3.5–5.1)
Sodium: 140 mEq/L (ref 135–145)

## 2012-04-01 LAB — HEMOGLOBIN A1C: Hgb A1c MFr Bld: 8.6 % — ABNORMAL HIGH (ref 4.6–6.5)

## 2012-04-01 LAB — PSA: PSA: 3.39 ng/mL (ref 0.10–4.00)

## 2012-04-01 LAB — TSH: TSH: 2.28 u[IU]/mL (ref 0.35–5.50)

## 2012-04-01 LAB — LDL CHOLESTEROL, DIRECT: Direct LDL: 84.5 mg/dL

## 2012-04-03 ENCOUNTER — Encounter: Payer: Self-pay | Admitting: Internal Medicine

## 2012-04-03 ENCOUNTER — Ambulatory Visit (INDEPENDENT_AMBULATORY_CARE_PROVIDER_SITE_OTHER): Payer: BC Managed Care – PPO | Admitting: Internal Medicine

## 2012-04-03 VITALS — BP 132/80 | HR 57 | Temp 97.0°F | Ht 68.0 in | Wt 228.2 lb

## 2012-04-03 DIAGNOSIS — Z23 Encounter for immunization: Secondary | ICD-10-CM

## 2012-04-03 DIAGNOSIS — Z0000 Encounter for general adult medical examination without abnormal findings: Secondary | ICD-10-CM

## 2012-04-03 DIAGNOSIS — M549 Dorsalgia, unspecified: Secondary | ICD-10-CM

## 2012-04-03 DIAGNOSIS — E119 Type 2 diabetes mellitus without complications: Secondary | ICD-10-CM

## 2012-04-03 MED ORDER — TRAMADOL HCL 50 MG PO TABS
50.0000 mg | ORAL_TABLET | Freq: Four times a day (QID) | ORAL | Status: DC | PRN
Start: 2012-04-03 — End: 2017-02-01

## 2012-04-03 MED ORDER — CYCLOBENZAPRINE HCL 5 MG PO TABS: 5.0000 mg | ORAL_TABLET | Freq: Three times a day (TID) | ORAL | Status: DC | PRN

## 2012-04-03 MED ORDER — ALPRAZOLAM 0.5 MG PO TABS: 0.5000 mg | ORAL_TABLET | Freq: Every day | ORAL | Status: DC | PRN

## 2012-04-03 MED ORDER — PIOGLITAZONE HCL 15 MG PO TABS: 15.0000 mg | ORAL_TABLET | Freq: Every day | ORAL | Status: DC

## 2012-04-03 NOTE — Patient Instructions (Addendum)
You had the flu shot today Take all new medications as prescribed  - the actos at 15 mg per day, as well as the flexeril  Continue all other medications as before Your refills were done as requested today Please have the pharmacy call with any other refills you may need. Thank you for enrolling in MyChart. Please follow the instructions below to securely access your online medical record. MyChart allows you to send messages to your doctor, view your test results, renew your prescriptions, schedule appointments, and more. Please continue your efforts at being more active, low cholesterol diabetic diet, and weight control. Please return in 6 mo with Lab testing done 3-5 days before

## 2012-04-03 NOTE — Assessment & Plan Note (Signed)
Ok for flexeril prn,  to f/u any worsening symptoms or concerns 

## 2012-04-03 NOTE — Assessment & Plan Note (Signed)

## 2012-04-03 NOTE — Assessment & Plan Note (Addendum)
stable overall by hx and exam, most recent data reviewed with pt, and pt to continue medical treatment as before Lab Results  Component Value Date   HGBA1C 8.6* 04/01/2012   Except To add actos 15 qd

## 2012-04-03 NOTE — Progress Notes (Signed)
Subjective:    Patient ID: Nathan Medina, male    DOB: Sep 29, 1950, 61 y.o.   MRN: [id number]  HPI   Here for wellness and f/u;  Overall doing ok;  Pt denies CP, worsening SOB, DOE, wheezing, orthopnea, PND, worsening LE edema, palpitations, dizziness or syncope.  Pt denies neurological change such as new Headache, facial or extremity weakness.  Pt denies polydipsia, polyuria, or low sugar symptoms. Pt states overall good compliance with treatment and medications, good tolerability, and trying to follow lower cholesterol diet.  Pt denies worsening depressive symptoms, suicidal ideation or panic. No fever, wt loss, night sweats, loss of appetite, or other constitutional symptoms.  Pt states good ability with ADL's, low fall risk, home safety reviewed and adequate, no significant changes in hearing or vision, and occasionally active with exercise. Sugars have been mild elev due to stopping actos after wt loss last yr due to abd surgury, but now regiained some wt, and some dietary noncompliacne. Pt has had some recurring recent mild right LBP without change in severity, bowel or bladder change, fever, wt loss,  worsening LE pain/numbness/weakness, gait change or falls, flexeril helped before.  The long term plan for his ongoing right abd incisional hernia is to f/u about now (1 yr from last abd surgury) to consider repair - per dr Carolynne Edouard; pt plans for now to pursue likely feb or mar next yr, after his sister Raechel Ache and able to stay with him to help post op Past Medical History  Diagnosis Date  . HYPERTENSION   . ALLERGIC RHINITIS   . ANXIETY   . COLONIC POLYPS, HX OF   . DEPRESSION   . DIABETES MELLITUS, TYPE II   . GERD   . GOUT   . HYPERLIPIDEMIA   . IBS   . 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 Left renal mass 05/23/2011  . Abnormal liver function test 05/23/2011  . Lumbar radicular pain 06/03/2011   Past  Surgical History  Procedure Date  . Hernia repair   . Septoplasty   . Appendectomy 02/12/11  . Colon surgery 08/11/10    sigmoid colectomy  . Colon surgery 02/12/11    colostomy takedown    reports that he has never smoked. He has never used smokeless tobacco. He reports that he does not drink alcohol or use illicit drugs. family history includes Hypertension in his maternal grandmother and paternal aunt and Stroke in his maternal grandmother. Allergies  Allergen Reactions  . Escitalopram Oxalate     Patient unsure of the reaction.  . Quinapril Hcl     REACTION: rash  . Ciprofloxacin Rash  . Sulfa Antibiotics Itching    All over the body.   Review of Systems Review of Systems  Constitutional: Negative for diaphoresis, activity change, appetite change and unexpected weight change.  HENT: Negative for hearing loss, ear pain, facial swelling, mouth sores and neck stiffness.   Eyes: Negative for pain, redness and visual disturbance.  Respiratory: Negative for shortness of breath and wheezing.   Cardiovascular: Negative for chest pain and palpitations.  Gastrointestinal: Negative for diarrhea, blood in stool, abdominal distention and rectal pain.  Genitourinary: Negative for hematuria, flank pain and decreased urine volume.  Musculoskeletal: Negative for myalgias and joint swelling.  Skin: Negative for color change and wound.  Neurological: Negative for syncope and numbness.  Hematological: Negative for adenopathy.  Psychiatric/Behavioral: Negative for hallucinations, self-injury, decreased concentration and  agitation.      Objective:   Physical Exam BP 132/80  Pulse 57  Temp 97 F (36.1 C) (Oral)  Ht 5\' 8"  (1.727 m)  Wt 228 lb 4 oz (103.534 kg)  BMI 34.71 kg/m2  SpO2 97% Physical Exam  VS noted Constitutional: Pt is oriented to person, place, and time. Appears well-developed and well-nourished.  HENT:  Head: Normocephalic and atraumatic.  Right Ear: External ear normal.    Left Ear: External ear normal.  Nose: Nose normal.  Mouth/Throat: Oropharynx is clear and moist.  Eyes: Conjunctivae and EOM are normal. Pupils are equal, round, and reactive to light.  Neck: Normal range of motion. Neck supple. No JVD present. No tracheal deviation present.  Cardiovascular: Normal rate, regular rhythm, normal heart sounds and intact distal pulses.   Pulmonary/Chest: Effort normal and breath sounds normal.  Abdominal: Soft. Bowel sounds are normal. There is no tenderness. has right side ventral hernia, NT Musculoskeletal: Normal range of motion. Exhibits no edema.  Lymphadenopathy:  Has no cervical adenopathy.  Neurological: Pt is alert and oriented to person, place, and time. Pt has normal reflexes. No cranial nerve deficit.  Skin: Skin is warm and dry. No rash noted.  Psychiatric:  Has  normal mood and affect. Behavior is normal.     Assessment & Plan:

## 2012-04-09 ENCOUNTER — Other Ambulatory Visit: Payer: Self-pay

## 2012-04-09 MED ORDER — GLIMEPIRIDE 4 MG PO TABS: ORAL_TABLET | ORAL | Status: DC

## 2012-04-09 MED ORDER — TELMISARTAN 80 MG PO TABS: 80.0000 mg | ORAL_TABLET | Freq: Every day | ORAL | Status: DC

## 2012-04-09 MED ORDER — FUROSEMIDE 40 MG PO TABS: ORAL_TABLET | ORAL | Status: DC

## 2012-04-29 ENCOUNTER — Other Ambulatory Visit: Payer: Self-pay | Admitting: Internal Medicine

## 2012-05-16 ENCOUNTER — Other Ambulatory Visit: Payer: Self-pay | Admitting: *Deleted

## 2012-05-16 MED ORDER — METFORMIN HCL ER 500 MG PO TB24: 1500.0000 mg | ORAL_TABLET | Freq: Every day | ORAL | Status: DC

## 2012-07-01 ENCOUNTER — Ambulatory Visit (INDEPENDENT_AMBULATORY_CARE_PROVIDER_SITE_OTHER): Payer: BC Managed Care – PPO | Admitting: General Surgery

## 2012-07-01 ENCOUNTER — Encounter (INDEPENDENT_AMBULATORY_CARE_PROVIDER_SITE_OTHER): Payer: Self-pay | Admitting: General Surgery

## 2012-07-01 VITALS — BP 144/82 | HR 64 | Temp 97.4°F | Resp 20 | Ht 68.0 in | Wt 228.4 lb

## 2012-07-01 DIAGNOSIS — K439 Ventral hernia without obstruction or gangrene: Secondary | ICD-10-CM

## 2012-07-01 NOTE — Patient Instructions (Signed)
Plan for laparoscopic assisted ventral hernia repair with mesh

## 2012-07-01 NOTE — Progress Notes (Signed)
Subjective:     Patient ID: Nathan Medina, male   DOB: 1951/02/21, 62 y.o.   MRN: [id number]  HPI The patient is a 62 year old white male who is about a year and a half status post colostomy reversal. During his postoperative period he has developed a ventral hernia. Since we saw him last the hernia appears to be a larger. He has some discomfort associated with the hernia. He denies any nausea or vomiting. His appetite has been good and his bowels are working normally.  Review of Systems  Constitutional: Negative.   HENT: Negative.   Eyes: Negative.   Respiratory: Negative.   Cardiovascular: Negative.   Gastrointestinal: Negative.   Endocrine: Negative.   Genitourinary: Negative.   Musculoskeletal: Negative.   Skin: Negative.   Allergic/Immunologic: Negative.   Neurological: Negative.   Hematological: Negative.   Psychiatric/Behavioral: Negative.        Objective:   Physical Exam  Constitutional: He is oriented to person, place, and time. He appears well-developed and well-nourished.  HENT:  Head: Normocephalic and atraumatic.  Eyes: Conjunctivae and EOM are normal. Pupils are equal, round, and reactive to light.  Neck: Normal range of motion. Neck supple.  Cardiovascular: Normal rate, regular rhythm and normal heart sounds.   Pulmonary/Chest: Effort normal and breath sounds normal.  Abdominal: Soft. Bowel sounds are normal.  There is a large ventral hernia just to the right of the midline. It reduces easily.  Musculoskeletal: Normal range of motion.  Neurological: He is alert and oriented to person, place, and time.  Skin: Skin is warm and dry.  Psychiatric: He has a normal mood and affect. His behavior is normal.       Assessment:     The patient has a large ventral hernia secondary to his 2 previous colon surgeries. Because of the risk of incarceration and strangulation and the fact that the hernia were continued to enlarge I think he would benefit from having this  fixed. He would also like to have this done. I've discussed with him in detail the risks and benefits of the operation to fix the hernia as well as some of the technical aspects including the use of mesh and the possibility of infection and he understands and wishes to proceed.     Plan:     Plan for laparoscopic assisted ventral hernia repair with mesh

## 2012-07-31 ENCOUNTER — Encounter (HOSPITAL_COMMUNITY): Payer: Self-pay

## 2012-08-08 NOTE — Patient Instructions (Signed)
DEONE LEIFHEIT  08/08/2012   Your procedure is scheduled on: 08/20/12   Report to [facility] at   0630  AM.  Call this number if you have problems the morning of surgery: [phone]   Remember:   Do not eat food or drink liquids after midnight.   Take these medicines the morning of surgery with A SIP OF WATER:    Do not wear jewelry,   Do not wear lotions, powders, or perfumes.    Men may shave face and neck.  Do not bring valuables to the hospital.  Contacts, dentures or bridgework may not be worn into surgery.  Leave suitcase in the car. After surgery it may be brought to your room.  For patients admitted to the hospital, checkout time is 11:00 AM the day of  discharge.    SEE CHG INSTRUCTION SHEET    Please read over the following fact sheets that you were given: MRSA Information, coughing and deep breathing exercises, leg exercises               Failure to comply with these instructions may result in cancellation of your surgery.                Patient Signature ____________________________              Nurse Signature _____________________________

## 2012-08-11 ENCOUNTER — Encounter (HOSPITAL_COMMUNITY)
Admission: RE | Admit: 2012-08-11 | Discharge: 2012-08-11 | Disposition: A | Discharge status: Home / Self Care | Payer: BC Managed Care – PPO | Source: Ambulatory Visit | Attending: General Surgery | Admitting: General Surgery

## 2012-08-11 ENCOUNTER — Encounter (HOSPITAL_COMMUNITY): Payer: Self-pay

## 2012-08-11 HISTORY — DX: Chronic kidney disease, unspecified: N18.9

## 2012-08-11 HISTORY — DX: Unspecified osteoarthritis, unspecified site: M19.90

## 2012-08-11 HISTORY — DX: Shortness of breath: R06.02

## 2012-08-11 LAB — CBC
HCT: 44 % (ref 39.0–52.0)
Hemoglobin: 15 g/dL (ref 13.0–17.0)
MCH: 30.1 pg (ref 26.0–34.0)
MCHC: 34.1 g/dL (ref 30.0–36.0)
MCV: 88.2 fL (ref 78.0–100.0)
Platelets: 164 10*3/uL (ref 150–400)
RBC: 4.99 MIL/uL (ref 4.22–5.81)
RDW: 13.6 % (ref 11.5–15.5)
WBC: 6.9 10*3/uL (ref 4.0–10.5)

## 2012-08-11 LAB — SURGICAL PCR SCREEN
MRSA, PCR: NEGATIVE
Staphylococcus aureus: NEGATIVE

## 2012-08-11 NOTE — Progress Notes (Signed)
Last office visit note with [facility] on 08/01/12 on chart  CMETdone 08/01/12 with [facility] on chart along with Urinalysis done 08/04/12 on chart

## 2012-08-19 NOTE — Anesthesia Preprocedure Evaluation (Addendum)
Anesthesia Evaluation  Patient identified by MRN, date of birth, ID band Patient awake    Reviewed: Allergy & Precautions, H&P , NPO status , Patient's Chart, lab work & pertinent test results  History of Anesthesia Complications (+) PROLONGED EMERGENCE  Airway Mallampati: II TM Distance: >3 FB Neck ROM: Full    Dental  (+) Teeth Intact, Caps and Dental Advisory Given   Pulmonary shortness of breath,  breath sounds clear to auscultation  Pulmonary exam normal       Cardiovascular hypertension, Pt. on medications Rhythm:Regular Rate:Normal     Neuro/Psych Anxiety Depression negative neurological ROS     GI/Hepatic Neg liver ROS, GERD-  Medicated,  Endo/Other  diabetes, Well Controlled, Type 2, Oral Hypoglycemic AgentsMorbid obesity  Renal/GU Renal disease  negative genitourinary   Musculoskeletal negative musculoskeletal ROS (+)   Abdominal   Peds  Hematology negative hematology ROS (+)   Anesthesia Other Findings   Reproductive/Obstetrics negative OB ROS                          Anesthesia Physical Anesthesia Plan  ASA: III  Anesthesia Plan: General   Post-op Pain Management:    Induction: Intravenous  Airway Management Planned: Oral ETT  Additional Equipment:   Intra-op Plan:   Post-operative Plan: Extubation in OR  Informed Consent: I have reviewed the patients History and Physical, chart, labs and discussed the procedure including the risks, benefits and alternatives for the proposed anesthesia with the patient or authorized representative who has indicated his/her understanding and acceptance.   Dental advisory given  Plan Discussed with: CRNA  Anesthesia Plan Comments:         Anesthesia Quick Evaluation

## 2012-08-20 ENCOUNTER — Encounter (HOSPITAL_COMMUNITY): Payer: Self-pay | Admitting: *Deleted

## 2012-08-20 ENCOUNTER — Encounter (HOSPITAL_COMMUNITY): Payer: Self-pay | Admitting: Anesthesiology

## 2012-08-20 ENCOUNTER — Encounter (HOSPITAL_COMMUNITY)
Admission: RE | Disposition: A | Discharge status: Home Health | Payer: Self-pay | Source: Ambulatory Visit | Attending: General Surgery

## 2012-08-20 ENCOUNTER — Inpatient Hospital Stay (HOSPITAL_COMMUNITY)
Admission: RE | Admit: 2012-08-20 | Discharge: 2012-08-25 | DRG: 159 | Disposition: A | Discharge status: Home Health | Payer: BC Managed Care – PPO | Source: Ambulatory Visit | Attending: General Surgery | Admitting: General Surgery

## 2012-08-20 ENCOUNTER — Ambulatory Visit (HOSPITAL_COMMUNITY): Payer: BC Managed Care – PPO | Admitting: Anesthesiology

## 2012-08-20 ENCOUNTER — Inpatient Hospital Stay (HOSPITAL_COMMUNITY): Payer: BC Managed Care – PPO

## 2012-08-20 DIAGNOSIS — Y838 Other surgical procedures as the cause of abnormal reaction of the patient, or of later complication, without mention of misadventure at the time of the procedure: Secondary | ICD-10-CM | POA: Diagnosis not present

## 2012-08-20 DIAGNOSIS — K439 Ventral hernia without obstruction or gangrene: Secondary | ICD-10-CM

## 2012-08-20 DIAGNOSIS — K432 Incisional hernia without obstruction or gangrene: Principal | ICD-10-CM | POA: Diagnosis present

## 2012-08-20 DIAGNOSIS — F3289 Other specified depressive episodes: Secondary | ICD-10-CM | POA: Diagnosis present

## 2012-08-20 DIAGNOSIS — F411 Generalized anxiety disorder: Secondary | ICD-10-CM | POA: Diagnosis present

## 2012-08-20 DIAGNOSIS — Z9049 Acquired absence of other specified parts of digestive tract: Secondary | ICD-10-CM

## 2012-08-20 DIAGNOSIS — K560 Paralytic ileus: Secondary | ICD-10-CM | POA: Diagnosis not present

## 2012-08-20 DIAGNOSIS — Z79899 Other long term (current) drug therapy: Secondary | ICD-10-CM

## 2012-08-20 DIAGNOSIS — I10 Essential (primary) hypertension: Secondary | ICD-10-CM | POA: Diagnosis present

## 2012-08-20 DIAGNOSIS — F329 Major depressive disorder, single episode, unspecified: Secondary | ICD-10-CM | POA: Diagnosis present

## 2012-08-20 DIAGNOSIS — Z6835 Body mass index (BMI) 35.0-35.9, adult: Secondary | ICD-10-CM

## 2012-08-20 DIAGNOSIS — E119 Type 2 diabetes mellitus without complications: Secondary | ICD-10-CM | POA: Diagnosis present

## 2012-08-20 DIAGNOSIS — K929 Disease of digestive system, unspecified: Secondary | ICD-10-CM | POA: Diagnosis not present

## 2012-08-20 DIAGNOSIS — N289 Disorder of kidney and ureter, unspecified: Secondary | ICD-10-CM | POA: Diagnosis present

## 2012-08-20 DIAGNOSIS — K219 Gastro-esophageal reflux disease without esophagitis: Secondary | ICD-10-CM | POA: Diagnosis present

## 2012-08-20 HISTORY — PX: VENTRAL HERNIA REPAIR: SHX424

## 2012-08-20 HISTORY — PX: LYSIS OF ADHESION: SHX5961

## 2012-08-20 HISTORY — PX: INSERTION OF MESH: SHX5868

## 2012-08-20 LAB — GLUCOSE, CAPILLARY
Glucose-Capillary: 157 mg/dL — ABNORMAL HIGH (ref 70–99)
Glucose-Capillary: 191 mg/dL — ABNORMAL HIGH (ref 70–99)

## 2012-08-20 LAB — CBC
HCT: 41.9 % (ref 39.0–52.0)
Hemoglobin: 14.3 g/dL (ref 13.0–17.0)
MCH: 30.9 pg (ref 26.0–34.0)
MCHC: 34.1 g/dL (ref 30.0–36.0)
MCV: 90.5 fL (ref 78.0–100.0)
Platelets: 161 10*3/uL (ref 150–400)
RBC: 4.63 MIL/uL (ref 4.22–5.81)
RDW: 13.5 % (ref 11.5–15.5)
WBC: 15.5 10*3/uL — ABNORMAL HIGH (ref 4.0–10.5)

## 2012-08-20 LAB — CREATININE, SERUM
Creatinine, Ser: 1.54 mg/dL — ABNORMAL HIGH (ref 0.50–1.35)
GFR calc Af Amer: 54 mL/min — ABNORMAL LOW (ref >90)
GFR calc non Af Amer: 47 mL/min — ABNORMAL LOW (ref >90)

## 2012-08-20 SURGERY — REPAIR, HERNIA, VENTRAL, LAPAROSCOPY-ASSISTED
Anesthesia: General | Site: Abdomen | Wound class: Clean

## 2012-08-20 MED ORDER — CEFAZOLIN SODIUM-DEXTROSE 2-3 GM-% IV SOLR
2.0000 g | INTRAVENOUS | Status: AC
  Administered 2012-08-20: 2 g via INTRAVENOUS

## 2012-08-20 MED ORDER — ONDANSETRON HCL 4 MG/2ML IJ SOLN: 4.0000 mg | Freq: Four times a day (QID) | INTRAMUSCULAR | Status: DC | PRN

## 2012-08-20 MED ORDER — PIOGLITAZONE HCL 15 MG PO TABS
15.0000 mg | ORAL_TABLET | Freq: Every day | ORAL | Status: DC
  Administered 2012-08-21 – 2012-08-25 (×5): 15 mg via ORAL
  Filled 2012-08-20 (×7): qty 1

## 2012-08-20 MED ORDER — ALBUTEROL SULFATE (5 MG/ML) 0.5% IN NEBU
2.5000 mg | INHALATION_SOLUTION | Freq: Once | RESPIRATORY_TRACT | Status: AC
  Administered 2012-08-20: 2.5 mg via RESPIRATORY_TRACT

## 2012-08-20 MED ORDER — PROPOFOL 10 MG/ML IV BOLUS
INTRAVENOUS | Status: DC | PRN
  Administered 2012-08-20: 200 mg via INTRAVENOUS

## 2012-08-20 MED ORDER — SUCCINYLCHOLINE CHLORIDE 20 MG/ML IJ SOLN
INTRAMUSCULAR | Status: DC | PRN
  Administered 2012-08-20: 100 mg via INTRAVENOUS

## 2012-08-20 MED ORDER — FUROSEMIDE 10 MG/ML IJ SOLN
40.0000 mg | Freq: Once | INTRAMUSCULAR | Status: AC
  Administered 2012-08-20: 40 mg via INTRAVENOUS

## 2012-08-20 MED ORDER — POTASSIUM CHLORIDE IN NACL 20-0.9 MEQ/L-% IV SOLN
INTRAVENOUS | Status: DC
  Administered 2012-08-20 – 2012-08-25 (×8): via INTRAVENOUS
  Filled 2012-08-20 (×9): qty 1000

## 2012-08-20 MED ORDER — PROMETHAZINE HCL 25 MG/ML IJ SOLN: 6.2500 mg | INTRAMUSCULAR | Status: DC | PRN

## 2012-08-20 MED ORDER — ACETAMINOPHEN 10 MG/ML IV SOLN
INTRAVENOUS | Status: DC | PRN
  Administered 2012-08-20: 1000 mg via INTRAVENOUS

## 2012-08-20 MED ORDER — NALOXONE HCL 0.4 MG/ML IJ SOLN: 0.4000 mg | INTRAMUSCULAR | Status: DC | PRN

## 2012-08-20 MED ORDER — HYDROMORPHONE HCL PF 1 MG/ML IJ SOLN
INTRAMUSCULAR | Status: AC
  Filled 2012-08-20: qty 1

## 2012-08-20 MED ORDER — CYCLOBENZAPRINE HCL 5 MG PO TABS
5.0000 mg | ORAL_TABLET | Freq: Three times a day (TID) | ORAL | Status: DC | PRN
  Administered 2012-08-21 – 2012-08-25 (×4): 5 mg via ORAL
  Filled 2012-08-20 (×3): qty 1

## 2012-08-20 MED ORDER — POVIDONE-IODINE 10 % EX OINT
TOPICAL_OINTMENT | CUTANEOUS | Status: AC
  Filled 2012-08-20: qty 28.35

## 2012-08-20 MED ORDER — CHLORHEXIDINE GLUCONATE 4 % EX LIQD: 1.0000 "application " | Freq: Once | CUTANEOUS | Status: DC

## 2012-08-20 MED ORDER — FUROSEMIDE 10 MG/ML IJ SOLN
INTRAMUSCULAR | Status: AC
  Filled 2012-08-20: qty 4

## 2012-08-20 MED ORDER — DIPHENHYDRAMINE HCL 12.5 MG/5ML PO ELIX: 12.5000 mg | ORAL_SOLUTION | Freq: Four times a day (QID) | ORAL | Status: DC | PRN

## 2012-08-20 MED ORDER — MORPHINE SULFATE (PF) 1 MG/ML IV SOLN
INTRAVENOUS | Status: AC
  Filled 2012-08-20: qty 25

## 2012-08-20 MED ORDER — NEOSTIGMINE METHYLSULFATE 1 MG/ML IJ SOLN
INTRAMUSCULAR | Status: DC | PRN
  Administered 2012-08-20: 4 mg via INTRAVENOUS

## 2012-08-20 MED ORDER — AMLODIPINE BESYLATE 10 MG PO TABS
10.0000 mg | ORAL_TABLET | Freq: Every day | ORAL | Status: DC
  Administered 2012-08-22 – 2012-08-25 (×4): 10 mg via ORAL
  Filled 2012-08-20 (×6): qty 1

## 2012-08-20 MED ORDER — ENOXAPARIN SODIUM 40 MG/0.4ML ~~LOC~~ SOLN
40.0000 mg | SUBCUTANEOUS | Status: DC
  Administered 2012-08-21 – 2012-08-25 (×5): 40 mg via SUBCUTANEOUS
  Filled 2012-08-20 (×5): qty 0.4

## 2012-08-20 MED ORDER — MORPHINE SULFATE (PF) 1 MG/ML IV SOLN
INTRAVENOUS | Status: DC
  Administered 2012-08-20: 12:00:00 via INTRAVENOUS
  Administered 2012-08-20 – 2012-08-21 (×2): 3 mg via INTRAVENOUS
  Administered 2012-08-21: 22:00:00 via INTRAVENOUS
  Administered 2012-08-21: 1.5 mg via INTRAVENOUS
  Administered 2012-08-21: 4.5 mg via INTRAVENOUS
  Administered 2012-08-21 – 2012-08-22 (×3): 1.5 mg via INTRAVENOUS
  Administered 2012-08-22: 3 mg via INTRAVENOUS
  Administered 2012-08-23 – 2012-08-24 (×2): 1.5 mg via INTRAVENOUS
  Filled 2012-08-20: qty 25

## 2012-08-20 MED ORDER — CISATRACURIUM BESYLATE (PF) 10 MG/5ML IV SOLN
INTRAVENOUS | Status: DC | PRN
  Administered 2012-08-20: 2 mg via INTRAVENOUS
  Administered 2012-08-20: 8 mg via INTRAVENOUS
  Administered 2012-08-20: 4 mg via INTRAVENOUS
  Administered 2012-08-20 (×3): 2 mg via INTRAVENOUS

## 2012-08-20 MED ORDER — ONDANSETRON HCL 4 MG/2ML IJ SOLN
INTRAMUSCULAR | Status: DC | PRN
  Administered 2012-08-20: 4 mg via INTRAVENOUS

## 2012-08-20 MED ORDER — PANTOPRAZOLE SODIUM 40 MG IV SOLR
40.0000 mg | Freq: Every day | INTRAVENOUS | Status: DC
  Administered 2012-08-20 – 2012-08-24 (×5): 40 mg via INTRAVENOUS
  Filled 2012-08-20 (×5): qty 40

## 2012-08-20 MED ORDER — GLIMEPIRIDE 4 MG PO TABS
8.0000 mg | ORAL_TABLET | Freq: Every day | ORAL | Status: DC
  Administered 2012-08-21 – 2012-08-25 (×5): 8 mg via ORAL
  Filled 2012-08-20 (×9): qty 2

## 2012-08-20 MED ORDER — SODIUM CHLORIDE 0.9 % IJ SOLN: 9.0000 mL | INTRAMUSCULAR | Status: DC | PRN

## 2012-08-20 MED ORDER — ACETAMINOPHEN 10 MG/ML IV SOLN
INTRAVENOUS | Status: AC
  Filled 2012-08-20: qty 100

## 2012-08-20 MED ORDER — 0.9 % SODIUM CHLORIDE (POUR BTL) OPTIME
TOPICAL | Status: DC | PRN
  Administered 2012-08-20: 2000 mL

## 2012-08-20 MED ORDER — FUROSEMIDE 80 MG PO TABS
80.0000 mg | ORAL_TABLET | Freq: Every day | ORAL | Status: DC
  Administered 2012-08-21 – 2012-08-25 (×4): 80 mg via ORAL
  Filled 2012-08-20 (×7): qty 1

## 2012-08-20 MED ORDER — POTASSIUM CHLORIDE CRYS ER 10 MEQ PO TBCR
20.0000 meq | EXTENDED_RELEASE_TABLET | Freq: Every day | ORAL | Status: DC
  Administered 2012-08-21 – 2012-08-25 (×5): 20 meq via ORAL
  Filled 2012-08-20 (×6): qty 2

## 2012-08-20 MED ORDER — METFORMIN HCL ER 750 MG PO TB24
1500.0000 mg | ORAL_TABLET | Freq: Every day | ORAL | Status: DC
  Administered 2012-08-21 – 2012-08-25 (×5): 1500 mg via ORAL
  Filled 2012-08-20 (×7): qty 2

## 2012-08-20 MED ORDER — DIPHENHYDRAMINE HCL 50 MG/ML IJ SOLN: 12.5000 mg | Freq: Four times a day (QID) | INTRAMUSCULAR | Status: DC | PRN

## 2012-08-20 MED ORDER — BUPIVACAINE-EPINEPHRINE PF 0.25-1:200000 % IJ SOLN
INTRAMUSCULAR | Status: DC | PRN
  Administered 2012-08-20: 8 mL

## 2012-08-20 MED ORDER — EPHEDRINE SULFATE 50 MG/ML IJ SOLN
INTRAMUSCULAR | Status: DC | PRN
  Administered 2012-08-20 (×2): 5 mg via INTRAVENOUS

## 2012-08-20 MED ORDER — ONDANSETRON HCL 4 MG PO TABS: 4.0000 mg | ORAL_TABLET | Freq: Four times a day (QID) | ORAL | Status: DC | PRN

## 2012-08-20 MED ORDER — ALBUTEROL SULFATE (5 MG/ML) 0.5% IN NEBU
INHALATION_SOLUTION | RESPIRATORY_TRACT | Status: AC
  Filled 2012-08-20: qty 0.5

## 2012-08-20 MED ORDER — LACTATED RINGERS IV SOLN
INTRAVENOUS | Status: DC
  Administered 2012-08-20: 1000 mL via INTRAVENOUS
  Administered 2012-08-20 (×2): via INTRAVENOUS

## 2012-08-20 MED ORDER — FENTANYL CITRATE 0.05 MG/ML IJ SOLN
INTRAMUSCULAR | Status: DC | PRN
  Administered 2012-08-20 (×3): 50 ug via INTRAVENOUS
  Administered 2012-08-20: 100 ug via INTRAVENOUS

## 2012-08-20 MED ORDER — GLYCOPYRROLATE 0.2 MG/ML IJ SOLN
INTRAMUSCULAR | Status: DC | PRN
  Administered 2012-08-20: 0.6 mg via INTRAVENOUS

## 2012-08-20 MED ORDER — MIDAZOLAM HCL 5 MG/5ML IJ SOLN
INTRAMUSCULAR | Status: DC | PRN
  Administered 2012-08-20: 2 mg via INTRAVENOUS

## 2012-08-20 MED ORDER — HYDROMORPHONE HCL PF 1 MG/ML IJ SOLN
INTRAMUSCULAR | Status: DC | PRN
  Administered 2012-08-20 (×4): 0.5 mg via INTRAVENOUS

## 2012-08-20 MED ORDER — IRBESARTAN 75 MG PO TABS
75.0000 mg | ORAL_TABLET | Freq: Every day | ORAL | Status: DC
  Administered 2012-08-21 – 2012-08-24 (×4): 75 mg via ORAL
  Filled 2012-08-20 (×7): qty 1

## 2012-08-20 MED ORDER — LIDOCAINE HCL (PF) 2 % IJ SOLN
INTRAMUSCULAR | Status: DC | PRN
  Administered 2012-08-20: 100 mg

## 2012-08-20 MED ORDER — CEFAZOLIN SODIUM-DEXTROSE 2-3 GM-% IV SOLR
INTRAVENOUS | Status: AC
  Filled 2012-08-20: qty 50

## 2012-08-20 MED ORDER — NEBIVOLOL HCL 5 MG PO TABS
20.0000 mg | ORAL_TABLET | Freq: Every day | ORAL | Status: DC
  Administered 2012-08-21 – 2012-08-24 (×4): 20 mg via ORAL
  Filled 2012-08-20 (×7): qty 4

## 2012-08-20 MED ORDER — LACTATED RINGERS IV SOLN: INTRAVENOUS | Status: DC

## 2012-08-20 MED ORDER — DOXAZOSIN MESYLATE 8 MG PO TABS
8.0000 mg | ORAL_TABLET | Freq: Every day | ORAL | Status: DC
  Administered 2012-08-20 – 2012-08-24 (×5): 8 mg via ORAL
  Filled 2012-08-20 (×6): qty 1

## 2012-08-20 MED ORDER — HYDROMORPHONE HCL PF 1 MG/ML IJ SOLN
0.2500 mg | INTRAMUSCULAR | Status: DC | PRN
  Administered 2012-08-20 (×4): 0.5 mg via INTRAVENOUS

## 2012-08-20 MED ORDER — SPIRONOLACTONE 25 MG PO TABS
25.0000 mg | ORAL_TABLET | Freq: Every day | ORAL | Status: DC
  Administered 2012-08-21 – 2012-08-25 (×5): 25 mg via ORAL
  Filled 2012-08-20 (×7): qty 1

## 2012-08-20 MED ORDER — ALPRAZOLAM 0.5 MG PO TABS: 0.2500 mg | ORAL_TABLET | Freq: Every day | ORAL | Status: DC | PRN

## 2012-08-20 SURGICAL SUPPLY — 51 items
APPLIER CLIP 5 13 M/L LIGAMAX5 (MISCELLANEOUS)
BANDAGE GAUZE ELAST BULKY 4 IN (GAUZE/BANDAGES/DRESSINGS) IMPLANT
BENZOIN TINCTURE PRP APPL 2/3 (GAUZE/BANDAGES/DRESSINGS) IMPLANT
BINDER ABD UNIV 12 45-62 (WOUND CARE) ×2 IMPLANT
BINDER ABDOMINAL 46IN 62IN (WOUND CARE) ×3
CANISTER SUCTION 2500CC (MISCELLANEOUS) ×3 IMPLANT
CLIP APPLIE 5 13 M/L LIGAMAX5 (MISCELLANEOUS) IMPLANT
CLOTH BEACON ORANGE TIMEOUT ST (SAFETY) ×3 IMPLANT
DECANTER SPIKE VIAL GLASS SM (MISCELLANEOUS) ×3 IMPLANT
DERMABOND ADVANCED (GAUZE/BANDAGES/DRESSINGS)
DERMABOND ADVANCED .7 DNX12 (GAUZE/BANDAGES/DRESSINGS) IMPLANT
DEVICE TROCAR PUNCTURE CLOSURE (ENDOMECHANICALS) ×3 IMPLANT
DRAIN CHANNEL RND F F (WOUND CARE) ×3 IMPLANT
DRAPE INCISE IOBAN 66X45 STRL (DRAPES) ×3 IMPLANT
DRAPE LAPAROSCOPIC ABDOMINAL (DRAPES) ×3 IMPLANT
ELECT BLADE TIP CTD 4 INCH (ELECTRODE) ×3 IMPLANT
ELECT REM PT RETURN 9FT ADLT (ELECTROSURGICAL) ×3
ELECTRODE REM PT RTRN 9FT ADLT (ELECTROSURGICAL) ×2 IMPLANT
EVACUATOR SILICONE 100CC (DRAIN) ×3 IMPLANT
GLOVE BIO SURGEON STRL SZ7.5 (GLOVE) ×6 IMPLANT
GLOVE BIOGEL PI IND STRL 7.0 (GLOVE) ×2 IMPLANT
GLOVE BIOGEL PI INDICATOR 7.0 (GLOVE) ×1
GOWN PREVENTION PLUS XLARGE (GOWN DISPOSABLE) ×3 IMPLANT
GOWN STRL NON-REIN LRG LVL3 (GOWN DISPOSABLE) IMPLANT
GOWN STRL REIN XL XLG (GOWN DISPOSABLE) ×9 IMPLANT
HAND ACTIVATED (MISCELLANEOUS) IMPLANT
KIT BASIN OR (CUSTOM PROCEDURE TRAY) ×3 IMPLANT
MARKER SKIN DUAL TIP RULER LAB (MISCELLANEOUS) ×3 IMPLANT
MESH PHYSIO OVAL 20X25CM (Mesh General) ×3 IMPLANT
NEEDLE SPNL 22GX3.5 QUINCKE BK (NEEDLE) ×3 IMPLANT
NS IRRIG 1000ML POUR BTL (IV SOLUTION) ×3 IMPLANT
PENCIL BUTTON HOLSTER BLD 10FT (ELECTRODE) ×3 IMPLANT
SET IRRIG TUBING LAPAROSCOPIC (IRRIGATION / IRRIGATOR) IMPLANT
SOLUTION ANTI FOG 6CC (MISCELLANEOUS) ×3 IMPLANT
SPONGE LAP 18X18 X RAY DECT (DISPOSABLE) ×9 IMPLANT
STAPLER VISISTAT 35W (STAPLE) ×3 IMPLANT
STRIP CLOSURE SKIN 1/2X4 (GAUZE/BANDAGES/DRESSINGS) IMPLANT
SUCTION POOLE TIP (SUCTIONS) ×3 IMPLANT
SUT ETHILON 2 0 PS N (SUTURE) ×3 IMPLANT
SUT MNCRL AB 4-0 PS2 18 (SUTURE) IMPLANT
SUT NOVA 1 T20/GS 25DT (SUTURE) ×21 IMPLANT
SUT NOVA NAB DX-16 0-1 5-0 T12 (SUTURE) IMPLANT
TACKER 5MM HERNIA 3.5CML NAB (ENDOMECHANICALS) IMPLANT
TOWEL OR 17X26 10 PK STRL BLUE (TOWEL DISPOSABLE) ×3 IMPLANT
TRAY FOLEY CATH 14FRSI W/METER (CATHETERS) ×3 IMPLANT
TRAY LAP CHOLE (CUSTOM PROCEDURE TRAY) ×3 IMPLANT
TROCAR BLADELESS OPT 5 75 (ENDOMECHANICALS) ×3 IMPLANT
TROCAR XCEL BLUNT TIP 100MML (ENDOMECHANICALS) IMPLANT
TROCAR XCEL NON-BLD 11X100MML (ENDOMECHANICALS) IMPLANT
TUBING FILTER THERMOFLATOR (ELECTROSURGICAL) IMPLANT
YANKAUER SUCT BULB TIP 10FT TU (MISCELLANEOUS) ×3 IMPLANT

## 2012-08-20 NOTE — Progress Notes (Signed)
PACU NOTE:  Dr Rica Mast notified pt unable to keep sats consistently over 92.  I and O from OR and output of 30 mls in PACU.  Orders received.

## 2012-08-20 NOTE — Progress Notes (Signed)
PACU NOTE:  Dr Rica Mast notified weaned to 4L canula  And sats 92-95%.  Pt alert and oriented.  Output 25 mls  post lasix.  No new orders. Authorized transfer to floor.

## 2012-08-20 NOTE — H&P (Signed)
           Nathan Medina Description: 62 year old male  07/01/2012 11:10 AM Office Visit Provider: Robyne Askew, MD  MRN: [id number] Department: [facility]            Diagnoses Reason for Visit   Ventral hernia - Primary  Re-evaluation   553.20  hernia           Current Vitals - Last Recorded    BP Pulse Temp(Src) Resp Ht Wt   144/82 64 97.4 F (36.3 C) (Temporal) 20 5\' 8"  (1.727 m) 228 lb 6.4 oz (103.602 kg)       BMI             34.74 kg/m2                 Progress Notes    Robyne Askew, MD at 07/01/2012 11:27 AM    Status: Signed             Subjective:     Patient ID: Nathan Medina, male DOB: 1950-06-26, 62 y.o. MRN: [id number]  HPI  The patient is a 62 year old white male who is about a year and a half status post colostomy reversal. During his postoperative period he has developed a ventral hernia. Since we saw him last the hernia appears to be a larger. He has some discomfort associated with the hernia. He denies any nausea or vomiting. His appetite has been good and his bowels are working normally.  Review of Systems  Constitutional: Negative.  HENT: Negative.  Eyes: Negative.  Respiratory: Negative.  Cardiovascular: Negative.  Gastrointestinal: Negative.  Endocrine: Negative.  Genitourinary: Negative.  Musculoskeletal: Negative.  Skin: Negative.  Allergic/Immunologic: Negative.  Neurological: Negative.  Hematological: Negative.  Psychiatric/Behavioral: Negative.     Objective:     Physical Exam  Constitutional: He is oriented to person, place, and time. He appears well-developed and well-nourished.  HENT:  Head: Normocephalic and atraumatic.  Eyes: Conjunctivae and EOM are normal. Pupils are equal, round, and reactive to light.  Neck: Normal range of motion. Neck supple.  Cardiovascular: Normal rate, regular rhythm and normal heart sounds.  Pulmonary/Chest: Effort normal and breath sounds normal.  Abdominal: Soft. Bowel  sounds are normal.  There is a large ventral hernia just to the right of the midline. It reduces easily.  Musculoskeletal: Normal range of motion.  Neurological: He is alert and oriented to person, place, and time.  Skin: Skin is warm and dry.  Psychiatric: He has a normal mood and affect. His behavior is normal.     Assessment:     The patient has a large ventral hernia secondary to his 2 previous colon surgeries. Because of the risk of incarceration and strangulation and the fact that the hernia were continued to enlarge I think he would benefit from having this fixed. He would also like to have this done. I've discussed with him in detail the risks and benefits of the operation to fix the hernia as well as some of the technical aspects including the use of mesh and the possibility of infection and he understands and wishes to proceed.     Plan:     Plan for laparoscopic assisted ventral hernia repair with mesh

## 2012-08-20 NOTE — Op Note (Signed)
08/20/2012  11:21 AM  PATIENT:  Nathan Medina  62 y.o. male  PRE-OPERATIVE DIAGNOSIS:  ventral hernia  POST-OPERATIVE DIAGNOSIS:  ventral hernia  PROCEDURE:  Procedure(s): LAPAROSCOPIC ASSISTED VENTRAL HERNIA REPAIR ATTEMPTED (N/A) INSERTION OF MESH (N/A) LYSIS OF ADHESION HERNIA REPAIR VENTRAL ADULT  SURGEON:  Surgeon(s) and Role:    * Robyne Askew, MD - Primary    * Almond Lint, MD - Assisting  PHYSICIAN ASSISTANT:   ASSISTANTS: Dr. Donell Beers   ANESTHESIA:   general  EBL:  Total I/O In: 2000 [I.V.:2000] Out: 400 [Urine:250; Blood:150]  BLOOD ADMINISTERED:none  DRAINS: (1) Jackson-Pratt drain(s) with closed bulb suction in the subq   LOCAL MEDICATIONS USED:  MARCAINE     SPECIMEN:  No Specimen  DISPOSITION OF SPECIMEN:  N/A  COUNTS:  YES  TOURNIQUET:  * No tourniquets in log *  DICTATION: .Dragon Dictation After informed consent was obtained the patient brought to the operating room and placed in the supine position on the operating room table. After adequate induction of general anesthesia the patient's abdomen was prepped with DuraPrep, allowed to dry, and draped in usual sterile manner. This included the use of an Ioban drape. A site was chosen in the right upper quadrant to try to access the abdominal cavity using a 5 mm Optiview port. This was unsuccessful. We then made a midline incision with a 10 blade knife. This incision was carried down through the skin and subcutaneous tissue sharply with the electrocautery until the hernia sac was identified. The hernia sac was gently opened and there were no visceral contents stuck within the sac. The sac was then completely opened. There were some adhesions to the left abdominal wall which were taken down sharply with Metzenbaum scissors. once this was accomplished the abdominal wall was free of adhesions. The fascia of the anterior abdominal wall had retracted back laterally fairly far on both sides. There was also a  small hernia at the old ostomy site. The ostomy site hernia was then closed with interrupted #1 Novafil stitches. The fascial edges were then defined and the abdominal wall edges were mobilized by dissecting on top of the fascia in the subcutaneous space. Once this was accomplished we measured the fascial defect craniocaudally to measure approximately 15 cm. We chose a 20 x 25 piece of physio mesh. The mesh was placed in the abdominal cavity and sewed to the abdominal wall over top of the ostomy hernia site. This was done also with interrupted #1 Novafil stitches. We then anchored the mesh with U stitches of #1 Novafil that went through the fascia of the abdominal wall into the abdominal cavity and then through the mesh and then back through the abdominal wall. This was done all around the fascial defect several centimeters back from the edge. Once this was accomplished all the stitches were tied and the mesh was in good position. Care was taken to make sure nothing got between the mesh and the abdominal wall. At this point the wound was irrigated with copious amounts of saline. The anterior abdominal wall fascia was then closed with interrupted #1 Novafil figure-of-eight stitches. The subcutaneous space was then irrigated with copious amounts of saline. A tonsil was placed through the 5 mm port site and into the subcutaneous space and used to bring a 19 [place] round Blake drain into the subcutaneous wound. The drain was anchored to the skin with a 3-0 nylon stitch. Next the skin was closed with staples. A drain  was placed to bulb suction and there was a good seal. Sterile dressings were applied. The patient tolerated the procedure well. At the end of the case all needle sponge and instrument counts were correct. The patient was then awakened and taken to recovery in stable condition.  PLAN OF CARE: Admit to inpatient   PATIENT DISPOSITION:  PACU - hemodynamically stable.   Delay start of Pharmacological VTE  agent (>24hrs) due to surgical blood loss or risk of bleeding: yes

## 2012-08-20 NOTE — Anesthesia Postprocedure Evaluation (Signed)
Anesthesia Post Note  Patient: Nathan Medina  Procedure(s) Performed: Procedure(s) (LRB): LAPAROSCOPIC ASSISTED VENTRAL HERNIA REPAIR ATTEMPTED (N/A) INSERTION OF MESH (N/A) LYSIS OF ADHESION HERNIA REPAIR VENTRAL ADULT  Anesthesia type: General  Patient location: PACU  Post pain: Pain level controlled  Post assessment: Post-op Vital signs reviewed  Last Vitals:  Filed Vitals:   08/20/12 1200  BP: 129/70  Pulse: 63  Temp:   Resp: 14    Post vital signs: Reviewed  Level of consciousness: sedated  Complications: No apparent anesthesia complications

## 2012-08-20 NOTE — Transfer of Care (Signed)
Immediate Anesthesia Transfer of Care Note  Patient: Nathan Medina  Procedure(s) Performed: Procedure(s): LAPAROSCOPIC ASSISTED VENTRAL HERNIA REPAIR ATTEMPTED (N/A) INSERTION OF MESH (N/A) LYSIS OF ADHESION HERNIA REPAIR VENTRAL ADULT  Patient Location: PACU  Anesthesia Type:General  Level of Consciousness: awake and responds to stimulation  Airway & Oxygen Therapy: Patient Spontanous Breathing and Patient connected to face mask oxygen  Post-op Assessment: Report given to PACU RN and Post -op Vital signs reviewed and stable  Post vital signs: Reviewed and stable  Complications: No apparent anesthesia complications

## 2012-08-20 NOTE — Interval H&P Note (Signed)
History and Physical Interval Note:  08/20/2012 8:10 AM  Nathan Medina  has presented today for surgery, with the diagnosis of ventral hernia  The various methods of treatment have been discussed with the patient and family. After consideration of risks, benefits and other options for treatment, the patient has consented to  Procedure(s): LAPAROSCOPIC ASSISTED VENTRAL HERNIA REPAIR (N/A) INSERTION OF MESH (N/A) as a surgical intervention .  The patient's history has been reviewed, patient examined, no change in status, stable for surgery.  I have reviewed the patient's chart and labs.  Questions were answered to the patient's satisfaction.     TOTH III,Skie Vitrano S

## 2012-08-20 NOTE — Preoperative (Signed)
Beta Blockers   Reason not to administer Beta Blockers:Took Bystolic last pm.

## 2012-08-21 DIAGNOSIS — E119 Type 2 diabetes mellitus without complications: Secondary | ICD-10-CM

## 2012-08-21 LAB — CBC
HCT: 40.8 % (ref 39.0–52.0)
Hemoglobin: 13.6 g/dL (ref 13.0–17.0)
MCH: 30.5 pg (ref 26.0–34.0)
MCHC: 33.3 g/dL (ref 30.0–36.0)
MCV: 91.5 fL (ref 78.0–100.0)
Platelets: 139 10*3/uL — ABNORMAL LOW (ref 150–400)
RBC: 4.46 MIL/uL (ref 4.22–5.81)
RDW: 14 % (ref 11.5–15.5)
WBC: 10.1 10*3/uL (ref 4.0–10.5)

## 2012-08-21 LAB — BASIC METABOLIC PANEL
BUN: 37 mg/dL — ABNORMAL HIGH (ref 6–23)
CO2: 28 mEq/L (ref 19–32)
Calcium: 9 mg/dL (ref 8.4–10.5)
Chloride: 104 mEq/L (ref 96–112)
Creatinine, Ser: 1.53 mg/dL — ABNORMAL HIGH (ref 0.50–1.35)
GFR calc Af Amer: 55 mL/min — ABNORMAL LOW (ref >90)
GFR calc non Af Amer: 47 mL/min — ABNORMAL LOW (ref >90)
Glucose, Bld: 163 mg/dL — ABNORMAL HIGH (ref 70–99)
Potassium: 4.6 mEq/L (ref 3.5–5.1)
Sodium: 141 mEq/L (ref 135–145)

## 2012-08-21 LAB — GLUCOSE, CAPILLARY
Glucose-Capillary: 151 mg/dL — ABNORMAL HIGH (ref 70–99)
Glucose-Capillary: 156 mg/dL — ABNORMAL HIGH (ref 70–99)
Glucose-Capillary: 145 mg/dL — ABNORMAL HIGH (ref 70–99)

## 2012-08-21 MED ORDER — INSULIN ASPART 100 UNIT/ML ~~LOC~~ SOLN
0.0000 [IU] | Freq: Three times a day (TID) | SUBCUTANEOUS | Status: DC
  Administered 2012-08-21 (×2): 2 [IU] via SUBCUTANEOUS
  Administered 2012-08-22: 1 [IU] via SUBCUTANEOUS
  Administered 2012-08-22 (×2): 2 [IU] via SUBCUTANEOUS
  Administered 2012-08-23 – 2012-08-24 (×3): 1 [IU] via SUBCUTANEOUS

## 2012-08-21 NOTE — Care Management Note (Addendum)
    Page 1 of 1   08/25/2012     10:45:40 AM   CARE MANAGEMENT NOTE 08/25/2012  Patient:  Nathan Medina, Nathan Medina   Account Number:  [id number]  Date Initiated:  08/21/2012  Documentation initiated by:  Lorenda Ishihara  Subjective/Objective Assessment:   62 yo male admitted s/p ventral hernia repair with lysis of adhesions. PTA lived at home alone.     Action/Plan:   Home when stable   Anticipated DC Date:  08/25/2012   Anticipated DC Plan:  HOME W HOME HEALTH SERVICES      DC Planning Services  CM consult      [facility] Choice  HOME HEALTH   Choice offered to / List presented to:  C-1 Patient        HH arranged  HH-1 RN      [facility] agency  [facility]   Status of service:  Completed, signed off Medicare Important Message given?   (If response is "NO", the following Medicare IM given date fields will be blank) Date Medicare IM given:   Date Additional Medicare IM given:    Discharge Disposition:  HOME W HOME HEALTH SERVICES  Per UR Regulation:  Reviewed for med. necessity/level of care/duration of stay  If discussed at Long Length of Stay Meetings, dates discussed:    Comments:  08-25-12 Lorenda Ishihara RN CM [phone] Patient will need assistance with drain care. Spoke with patient at bedside, plans to d/c home today with drain from surgery. Patient has use [facility] in the past for [facility] services and would like to use them again. Contacted Debbie with [facility] to arrange. Orders pending.

## 2012-08-21 NOTE — Progress Notes (Signed)
1 Day Post-Op  Subjective: Having a lot of pain but as expected. No nausea  Objective: Vital signs in last 24 hours: Temp:  [97.9 F (36.6 C)-99.6 F (37.6 C)] 97.9 F (36.6 C) (04/03 1403) Pulse Rate:  [68-96] 82 (04/03 1403) Resp:  [14-25] 18 (04/03 1403) BP: (99-172)/(60-85) 128/85 mmHg (04/03 1403) SpO2:  [91 %-96 %] 92 % (04/03 1403) Weight:  [230 lb 6.1 oz (104.5 kg)] 230 lb 6.1 oz (104.5 kg) (04/02 2045)    Intake/Output from previous day: 04/02 0701 - 04/03 0700 In: 3701.7 [I.V.:3701.7] Out: 1565 [Urine:1185; Drains:230; Blood:150] Intake/Output this shift: Total I/O In: -  Out: 320 [Urine:300; Drains:20]  GI: soft, appropriately tender. quiet  Lab Results:   Recent Labs  08/20/12 1504 08/21/12 0453  WBC 15.5* 10.1  HGB 14.3 13.6  HCT 41.9 40.8  PLT 161 139*   BMET  Recent Labs  08/20/12 1504 08/21/12 0453  NA  --  141  K  --  4.6  CL  --  104  CO2  --  28  GLUCOSE  --  163*  BUN  --  37*  CREATININE 1.54* 1.53*  CALCIUM  --  9.0   PT/INR No results found for this basename: LABPROT, INR,  in the last 72 hours ABG No results found for this basename: PHART, PCO2, PO2, HCO3,  in the last 72 hours  Studies/Results: Dg Chest Port 1 View  08/20/2012  *RADIOLOGY REPORT*  Clinical Data: Low oxygen saturation postoperatively  PORTABLE CHEST - 1 VIEW  Comparison: Portable exam 1303 hours compared to 05/18/2011  Findings: Low lung volumes. Enlargement of cardiac silhouette with pulmonary vascular congestion, likely accentuated by degree of hypoinflation. Minimal atelectasis at right base and left upper lobe. No gross consolidation, pleural effusion, or pneumothorax.  IMPRESSION: Low lung volumes with scattered atelectasis and enlargement of cardiac silhouette.   Original Report Authenticated By: Ulyses Southward, M.D.     Anti-infectives: Anti-infectives   Start     Dose/Rate Route Frequency Ordered Stop   08/20/12 0641  ceFAZolin (ANCEF) IVPB 2 g/50 mL premix      2 g 100 mL/hr over 30 Minutes Intravenous On call to O.R. 08/20/12 0641 08/20/12 0837      Assessment/Plan: s/p Procedure(s): LAPAROSCOPIC ASSISTED VENTRAL HERNIA REPAIR ATTEMPTED (N/A) INSERTION OF MESH (N/A) LYSIS OF ADHESION HERNIA REPAIR VENTRAL ADULT Stay with ice chips until bowel function returns Continue pca for pain control Add sliding scale insulin for elevated blood sugars and diabetes  LOS: 1 day    TOTH III,Alaa Eyerman S 08/21/2012

## 2012-08-22 ENCOUNTER — Encounter (HOSPITAL_COMMUNITY): Payer: Self-pay | Admitting: General Surgery

## 2012-08-22 LAB — GLUCOSE, CAPILLARY
Glucose-Capillary: 155 mg/dL — ABNORMAL HIGH (ref 70–99)
Glucose-Capillary: 146 mg/dL — ABNORMAL HIGH (ref 70–99)
Glucose-Capillary: 171 mg/dL — ABNORMAL HIGH (ref 70–99)
Glucose-Capillary: 129 mg/dL — ABNORMAL HIGH (ref 70–99)

## 2012-08-22 MED ORDER — DM-GUAIFENESIN ER 30-600 MG PO TB12
1.0000 | ORAL_TABLET | Freq: Two times a day (BID) | ORAL | Status: DC
  Administered 2012-08-22 – 2012-08-25 (×6): 1 via ORAL
  Filled 2012-08-22 (×8): qty 1

## 2012-08-22 NOTE — Progress Notes (Signed)
2 Days Post-Op  Subjective: Complains of dry mouth  Objective: Vital signs in last 24 hours: Temp:  [97.4 F (36.3 C)-99.7 F (37.6 C)] 97.4 F (36.3 C) (04/04 1400) Pulse Rate:  [68-86] 68 (04/04 1400) Resp:  [19-22] 21 (04/04 1532) BP: (127-156)/(83-90) 127/84 mmHg (04/04 1400) SpO2:  [91 %-95 %] 94 % (04/04 1532)    Intake/Output from previous day: 04/03 0701 - 04/04 0700 In: 2400 [I.V.:2400] Out: 3605 [Urine:3500; Drains:105] Intake/Output this shift: Total I/O In: 400 [I.V.:400] Out: 975 [Urine:950; Drains:25]  GI: soft, appropriately tender. few bs  Lab Results:   Recent Labs  08/20/12 1504 08/21/12 0453  WBC 15.5* 10.1  HGB 14.3 13.6  HCT 41.9 40.8  PLT 161 139*   BMET  Recent Labs  08/20/12 1504 08/21/12 0453  NA  --  141  K  --  4.6  CL  --  104  CO2  --  28  GLUCOSE  --  163*  BUN  --  37*  CREATININE 1.54* 1.53*  CALCIUM  --  9.0   PT/INR No results found for this basename: LABPROT, INR,  in the last 72 hours ABG No results found for this basename: PHART, PCO2, PO2, HCO3,  in the last 72 hours  Studies/Results: No results found.  Anti-infectives: Anti-infectives   Start     Dose/Rate Route Frequency Ordered Stop   08/20/12 0641  ceFAZolin (ANCEF) IVPB 2 g/50 mL premix     2 g 100 mL/hr over 30 Minutes Intravenous On call to O.R. 08/20/12 0641 08/20/12 0837      Assessment/Plan: s/p Procedure(s): LAPAROSCOPIC ASSISTED VENTRAL HERNIA REPAIR ATTEMPTED (N/A) INSERTION OF MESH (N/A) LYSIS OF ADHESION HERNIA REPAIR VENTRAL ADULT start clear liquids Hold lasix for a day guaifenisin  LOS: 2 days    TOTH III,PAUL S 08/22/2012

## 2012-08-23 LAB — GLUCOSE, CAPILLARY
Glucose-Capillary: 141 mg/dL — ABNORMAL HIGH (ref 70–99)
Glucose-Capillary: 131 mg/dL — ABNORMAL HIGH (ref 70–99)

## 2012-08-23 NOTE — Progress Notes (Signed)
Patient ID: Nathan Medina, male   DOB: [birth year], 62 y.o.   MRN: [id number]  General Surgery - [facility] - Progress Note  POD# 3  Subjective: Patient slowly improving.  Ambulated.  Passing flatus.  Tolerating clear liquids but does not want to advance.  No nausea or emesis.  Objective: Vital signs in last 24 hours: Temp:  [97.4 F (36.3 C)-98 F (36.7 C)] 97.8 F (36.6 C) (04/05 0500) Pulse Rate:  [68-95] 82 (04/05 0500) Resp:  [19-22] 20 (04/05 0500) BP: (120-147)/(78-91) 124/79 mmHg (04/05 0500) SpO2:  [91 %-95 %] 95 % (04/05 0500) Last BM Date: 08/19/12  Intake/Output from previous day: 04/04 0701 - 04/05 0700 In: 1929.2 [P.O.:120; I.V.:1809.2] Out: 2030 [Urine:1975; Drains:55]  Exam: HEENT - clear, not icteric Neck - soft Chest - clear bilaterally Cor - RRR, no murmur Abd - mild distension; dressing intact; JP with thin serosanguinous Ext - no significant edema Neuro - grossly intact, no focal deficits  Lab Results:   Recent Labs  08/20/12 1504 08/21/12 0453  WBC 15.5* 10.1  HGB 14.3 13.6  HCT 41.9 40.8  PLT 161 139*     Recent Labs  08/20/12 1504 08/21/12 0453  NA  --  141  K  --  4.6  CL  --  104  CO2  --  28  GLUCOSE  --  163*  BUN  --  37*  CREATININE 1.54* 1.53*  CALCIUM  --  9.0    Studies/Results: No results found.  Assessment / Plan: 1.  Status post VH repair  Clear liquid diet per patient request  Ambulate in halls  Binder on  Monitor drain output  Pain control with PCA  Velora Heckler, MD, [facility] Office: [phone]  08/23/2012

## 2012-08-24 LAB — GLUCOSE, CAPILLARY
Glucose-Capillary: 93 mg/dL (ref 70–99)
Glucose-Capillary: 142 mg/dL — ABNORMAL HIGH (ref 70–99)
Glucose-Capillary: 120 mg/dL — ABNORMAL HIGH (ref 70–99)
Glucose-Capillary: 103 mg/dL — ABNORMAL HIGH (ref 70–99)
Glucose-Capillary: 133 mg/dL — ABNORMAL HIGH (ref 70–99)
Glucose-Capillary: 109 mg/dL — ABNORMAL HIGH (ref 70–99)

## 2012-08-24 MED ORDER — TRAMADOL HCL 50 MG PO TABS
50.0000 mg | ORAL_TABLET | Freq: Four times a day (QID) | ORAL | Status: DC | PRN
  Administered 2012-08-24 – 2012-08-25 (×3): 50 mg via ORAL
  Filled 2012-08-24 (×3): qty 1

## 2012-08-24 MED ORDER — PANTOPRAZOLE SODIUM 40 MG PO TBEC
40.0000 mg | DELAYED_RELEASE_TABLET | Freq: Every day | ORAL | Status: DC
  Administered 2012-08-25: 40 mg via ORAL
  Filled 2012-08-24: qty 1

## 2012-08-24 NOTE — Progress Notes (Signed)
Patient ID: Nathan Medina, male   DOB: [birth year], 62 y.o.   MRN: [id number]  General Surgery - [facility] - Progress Note  POD# 4  Subjective: Patient up in chair.  Two BM's this AM!  Ambulatory.  Denies nausea.  Pain controlled.  Objective: Vital signs in last 24 hours: Temp:  [96.6 F (35.9 C)-98 F (36.7 C)] 97.5 F (36.4 C) (04/06 0521) Pulse Rate:  [55-64] 55 (04/06 0521) Resp:  [14-20] 18 (04/06 0751) BP: (120-148)/(74-82) 120/74 mmHg (04/06 0521) SpO2:  [94 %-100 %] 98 % (04/06 0751) Last BM Date: 08/19/12  Intake/Output from previous day: 04/05 0701 - 04/06 0700 In: 1993.3 [P.O.:840; I.V.:1153.3] Out: 1728 [Urine:1675; Drains:53]  Exam: HEENT - clear, not icteric Neck - soft Chest - clear bilaterally Cor - RRR, no murmur Abd - binder on; BS present; dressings dry and intact Ext - no significant edema Neuro - grossly intact, no focal deficits  Lab Results:  No results found for this basename: WBC, HGB, HCT, PLT,  in the last 72 hours  No results found for this basename: NA, K, CL, CO2, GLUCOSE, BUN, CREATININE, CALCIUM,  in the last 72 hours  Studies/Results: No results found.  Assessment / Plan: 1.  Status post open VH repair  Advance diet  Discontinue PCA  Begin PO pain meds  Ambulate  Velora Heckler, MD, [facility] Office: [phone]  08/24/2012

## 2012-08-25 LAB — GLUCOSE, CAPILLARY: Glucose-Capillary: 115 mg/dL — ABNORMAL HIGH (ref 70–99)

## 2012-08-25 MED ORDER — TRAMADOL HCL 50 MG PO TABS: 50.0000 mg | ORAL_TABLET | Freq: Four times a day (QID) | ORAL | Status: DC | PRN

## 2012-08-25 MED ORDER — CYCLOBENZAPRINE HCL 10 MG PO TABS: 10.0000 mg | ORAL_TABLET | Freq: Three times a day (TID) | ORAL | Status: DC | PRN

## 2012-08-25 NOTE — Progress Notes (Signed)
5 Days Post-Op  Subjective: Feeling better. Pain is manageable with pills  Objective: Vital signs in last 24 hours: Temp:  [97.8 F (36.6 C)-98.5 F (36.9 C)] 98 F (36.7 C) (04/07 0538) Pulse Rate:  [58-68] 58 (04/07 0538) Resp:  [18-20] 20 (04/07 0538) BP: (108-134)/(70-77) 128/75 mmHg (04/07 0538) SpO2:  [97 %-99 %] 97 % (04/07 0538) Last BM Date: 08/24/12  Intake/Output from previous day: 04/06 0701 - 04/07 0700 In: 1470.8 [P.O.:240; I.V.:1230.8] Out: 1265 [Urine:1175; Drains:90] Intake/Output this shift:    GI: soft, mild tenderness. incision looks good. having bm's. drain output about 90cc serous fluid  Lab Results:  No results found for this basename: WBC, HGB, HCT, PLT,  in the last 72 hours BMET No results found for this basename: NA, K, CL, CO2, GLUCOSE, BUN, CREATININE, CALCIUM,  in the last 72 hours PT/INR No results found for this basename: LABPROT, INR,  in the last 72 hours ABG No results found for this basename: PHART, PCO2, PO2, HCO3,  in the last 72 hours  Studies/Results: No results found.  Anti-infectives: Anti-infectives   Start     Dose/Rate Route Frequency Ordered Stop   08/20/12 0641  ceFAZolin (ANCEF) IVPB 2 g/50 mL premix     2 g 100 mL/hr over 30 Minutes Intravenous On call to O.R. 08/20/12 0641 08/20/12 0837      Assessment/Plan: s/p Procedure(s): LAPAROSCOPIC ASSISTED VENTRAL HERNIA REPAIR ATTEMPTED (N/A) INSERTION OF MESH (N/A) LYSIS OF ADHESION HERNIA REPAIR VENTRAL ADULT Discharge Will need to keep drain a few more days  LOS: 5 days    TOTH III,PAUL S 08/25/2012

## 2012-08-25 NOTE — Discharge Summary (Signed)
Physician Discharge Summary  Patient ID: Nathan Medina MRN: [id number] DOB/AGE: 1950-07-07 62 y.o.  Admit date: 08/20/2012 Discharge date: 08/25/2012  Admission Diagnoses:  Discharge Diagnoses:  Active Problems:   * No active hospital problems. *   Discharged Condition: good  Hospital Course: the pt underwent a ventral hernia repair with mesh. Postop he had issues with pain control and ileus. These gradually resolved and he is now ready for discharge home.  Consults: None  Significant Diagnostic Studies: none  Treatments: surgery: as above  Discharge Exam: Blood pressure 128/75, pulse 58, temperature 98 F (36.7 C), temperature source Oral, resp. rate 20, height 5\' 8"  (1.727 m), weight 230 lb 6.1 oz (104.5 kg), SpO2 97.00%. GI: soft, mild tenderness. incision looks good  Disposition: 06-Home-Health Care Svc   Future Appointments Provider Department Dept Phone   09/08/2012 11:10 AM Robyne Askew, MD [facility], [place] [phone]       Medication List    ASK your doctor about these medications       ALPRAZolam 0.5 MG tablet  Commonly known as:  XANAX  Take 1 tablet (0.5 mg total) by mouth daily as needed for sleep. 1/2 - 1 by mouth once daily as needed     AMLODIPINE BESYLATE PO  Take 10 mg by mouth daily.     aspirin 81 MG EC tablet  Take 81 mg by mouth daily.     BYSTOLIC 20 MG Tabs  Generic drug:  Nebivolol HCl  TAKE 1 TABLET BY MOUTH EVERY DAY     CLARITIN PO  Take 10 mg by mouth daily. Patient takes as needed     cyclobenzaprine 5 MG tablet  Commonly known as:  FLEXERIL  Take 1 tablet (5 mg total) by mouth 3 (three) times daily as needed for muscle spasms.     diphenhydrAMINE 25 MG tablet  Commonly known as:  SOMINEX  Take 25 mg by mouth at bedtime as needed for sleep.     doxazosin 4 MG tablet  Commonly known as:  CARDURA  Take 8 mg by mouth at bedtime. Take 2 by mouth at bedtime     EFFER-K 20 MEQ Tbef  Generic drug:  Potassium  Bicarb-Citric Acid  Take 20 mEq by mouth daily.     furosemide 40 MG tablet  Commonly known as:  LASIX  80 mg. 2 tablets in the morning     glimepiride 4 MG tablet  Commonly known as:  AMARYL  Take 8 mg by mouth daily before breakfast.     glucose blood test strip  Commonly known as:  ONE TOUCH ULTRA TEST  Use as directed 1 per day     Lancets Misc  1 application by Does not apply route daily.     metFORMIN 500 MG 24 hr tablet  Commonly known as:  GLUCOPHAGE-XR  Take 3 tablets (1,500 mg total) by mouth daily with breakfast.     MICARDIS 80 MG tablet  Generic drug:  telmisartan  TAKE 1 TABLET BY MOUTH EVERY DAY     MUCINEX PO  Take by mouth daily. Patient takes as needed     multivitamin tablet  Take 1 tablet by mouth daily.     Omeprazole 20 MG Tbec  TAKE 1 TABLET BY MOUTH EVERY DAY     pioglitazone 15 MG tablet  Commonly known as:  ACTOS  Take 1 tablet (15 mg total) by mouth daily.     spironolactone 25 MG tablet  Commonly known as:  ALDACTONE  Take 25 mg by mouth every morning.     tadalafil 20 MG tablet  Commonly known as:  CIALIS  Take 20 mg by mouth daily as needed.     traMADol 50 MG tablet  Commonly known as:  ULTRAM  Take 1 tablet (50 mg total) by mouth every 6 (six) hours as needed for pain. Maximum dose= 8 tablets per day     vitamin E 100 UNIT capsule  Take 400 Units by mouth daily.           Follow-up Information   Follow up with Robyne Askew, MD In 1 week.   Contact information:   [address] [place] [address] [phone]       Signed: Robyne Askew 08/25/2012, 7:45 AM

## 2012-09-01 ENCOUNTER — Ambulatory Visit (INDEPENDENT_AMBULATORY_CARE_PROVIDER_SITE_OTHER): Payer: BC Managed Care – PPO | Admitting: General Surgery

## 2012-09-01 ENCOUNTER — Encounter (INDEPENDENT_AMBULATORY_CARE_PROVIDER_SITE_OTHER): Payer: Self-pay | Admitting: General Surgery

## 2012-09-01 VITALS — BP 146/88 | HR 69 | Temp 97.2°F | Ht 68.0 in | Wt 224.0 lb

## 2012-09-01 DIAGNOSIS — K439 Ventral hernia without obstruction or gangrene: Secondary | ICD-10-CM

## 2012-09-01 NOTE — Patient Instructions (Signed)
No heavy lifting May shower on tuesday 

## 2012-09-01 NOTE — Progress Notes (Signed)
Subjective:     Patient ID: Nathan Medina, male   DOB: 1951/02/17, 62 y.o.   MRN: [id number]  HPI The patient is a 62 year old white male who is 12 days status post ventral hernia repair with mesh. Overall he is doing well. He still has soreness but the acute pain has resolved. He is no longer taking any narcotic pain medicine. His appetite is good and his bowels are moving normally. His drain is putting out minimal amounts of fluid.  Review of Systems     Objective:   Physical Exam On exam his midline incision is healing nicely with no sign of infection or seroma. The drain was removed without difficulty and he tolerated this well. His staples were also removed and benzoin and Steri-Strips were placed across the incision.    Assessment:     The patient is 12 days status post ventral hernia repair with mesh     Plan:     At this point I would like him to continue to refrain from any strenuous activity. We will plan to see him back in one month to check his progress.

## 2012-09-08 ENCOUNTER — Encounter (INDEPENDENT_AMBULATORY_CARE_PROVIDER_SITE_OTHER): Payer: BC Managed Care – PPO | Admitting: General Surgery

## 2012-09-29 ENCOUNTER — Encounter (INDEPENDENT_AMBULATORY_CARE_PROVIDER_SITE_OTHER): Payer: Self-pay | Admitting: General Surgery

## 2012-09-29 ENCOUNTER — Ambulatory Visit (INDEPENDENT_AMBULATORY_CARE_PROVIDER_SITE_OTHER): Payer: BC Managed Care – PPO | Admitting: General Surgery

## 2012-09-29 VITALS — BP 118/70 | HR 60 | Temp 97.4°F | Resp 16 | Ht 68.0 in | Wt 224.8 lb

## 2012-09-29 DIAGNOSIS — K439 Ventral hernia without obstruction or gangrene: Secondary | ICD-10-CM

## 2012-09-29 NOTE — Patient Instructions (Signed)
May begin gradual return to normal activities

## 2012-09-30 ENCOUNTER — Encounter (INDEPENDENT_AMBULATORY_CARE_PROVIDER_SITE_OTHER): Payer: Self-pay | Admitting: General Surgery

## 2012-09-30 NOTE — Progress Notes (Signed)
Subjective:     Patient ID: LINDELL RENFREW, male   DOB: 24-Oct-1950, 62 y.o.   MRN: [id number]  HPI The patient is a 62 year old white male who is about 6 weeks status post ventral hernia repair with mesh. He is moving around much more comfortably. He states that almost all of the soreness has gone away. His appetite is good and his bowels are working normally.  Review of Systems     Objective:   Physical Exam On exam his abdomen is soft and nontender. His midline incision has healed nicely with no sign of infection or significant seroma. His abdominal wall feels solid with no palpable evidence of recurrence of the hernia.    Assessment:     The patient is 6 weeks status post ventral hernia repair with mesh     Plan:     At this point he may gradually began increasing his activity until he is back to normal activities. We will plan to see him back on a when necessary basis.

## 2012-10-02 ENCOUNTER — Ambulatory Visit: Payer: BC Managed Care – PPO | Admitting: Internal Medicine

## 2012-10-15 ENCOUNTER — Other Ambulatory Visit: Payer: Self-pay

## 2012-10-15 MED ORDER — NEBIVOLOL HCL 20 MG PO TABS: 20.0000 mg | ORAL_TABLET | Freq: Every day | ORAL | Status: DC

## 2012-11-04 ENCOUNTER — Other Ambulatory Visit: Payer: Self-pay | Admitting: Internal Medicine

## 2012-12-04 ENCOUNTER — Other Ambulatory Visit: Payer: Self-pay | Admitting: Gastroenterology

## 2012-12-04 DIAGNOSIS — R7402 Elevation of levels of lactic acid dehydrogenase (LDH): Secondary | ICD-10-CM

## 2012-12-16 ENCOUNTER — Ambulatory Visit
Admission: RE | Admit: 2012-12-16 | Discharge: 2012-12-16 | Disposition: A | Discharge status: Home / Self Care | Payer: BC Managed Care – PPO | Source: Ambulatory Visit | Attending: Gastroenterology | Admitting: Gastroenterology

## 2012-12-16 DIAGNOSIS — R7402 Elevation of levels of lactic acid dehydrogenase (LDH): Secondary | ICD-10-CM

## 2013-02-09 ENCOUNTER — Other Ambulatory Visit: Payer: Self-pay | Admitting: Internal Medicine

## 2013-02-09 NOTE — Telephone Encounter (Signed)
Not listed on pt's current med list. OK to refill? Last OV 11.14.13

## 2013-02-09 NOTE — Telephone Encounter (Signed)
One refill only, done erx  Needs OV for further refills

## 2013-04-09 ENCOUNTER — Other Ambulatory Visit: Payer: Self-pay | Admitting: Internal Medicine

## 2013-05-16 ENCOUNTER — Other Ambulatory Visit: Payer: Self-pay | Admitting: Internal Medicine

## 2013-06-01 ENCOUNTER — Other Ambulatory Visit: Payer: Self-pay | Admitting: Gastroenterology

## 2013-06-01 DIAGNOSIS — R7402 Elevation of levels of lactic acid dehydrogenase (LDH): Secondary | ICD-10-CM

## 2013-06-01 DIAGNOSIS — R740 Nonspecific elevation of levels of transaminase and lactic acid dehydrogenase [LDH]: Principal | ICD-10-CM

## 2013-06-01 DIAGNOSIS — R7401 Elevation of levels of liver transaminase levels: Secondary | ICD-10-CM

## 2013-06-22 ENCOUNTER — Ambulatory Visit
Admission: RE | Admit: 2013-06-22 | Discharge: 2013-06-22 | Disposition: A | Discharge status: Home / Self Care | Payer: BC Managed Care – PPO | Source: Ambulatory Visit | Attending: Gastroenterology | Admitting: Gastroenterology

## 2013-06-22 DIAGNOSIS — R740 Nonspecific elevation of levels of transaminase and lactic acid dehydrogenase [LDH]: Principal | ICD-10-CM

## 2013-06-22 DIAGNOSIS — R7402 Elevation of levels of lactic acid dehydrogenase (LDH): Secondary | ICD-10-CM

## 2013-11-10 ENCOUNTER — Other Ambulatory Visit: Payer: Self-pay | Admitting: Gastroenterology

## 2014-06-21 ENCOUNTER — Other Ambulatory Visit: Payer: Self-pay | Admitting: Internal Medicine

## 2014-06-21 DIAGNOSIS — R140 Abdominal distension (gaseous): Secondary | ICD-10-CM

## 2014-06-22 ENCOUNTER — Other Ambulatory Visit (HOSPITAL_COMMUNITY): Payer: Self-pay | Admitting: Gastroenterology

## 2014-06-22 ENCOUNTER — Ambulatory Visit
Admission: RE | Admit: 2014-06-22 | Discharge: 2014-06-22 | Disposition: A | Discharge status: Home / Self Care | Payer: BC Managed Care – PPO | Source: Ambulatory Visit | Attending: Internal Medicine | Admitting: Internal Medicine

## 2014-06-22 DIAGNOSIS — R140 Abdominal distension (gaseous): Secondary | ICD-10-CM

## 2014-06-22 DIAGNOSIS — R143 Flatulence: Secondary | ICD-10-CM

## 2014-07-07 ENCOUNTER — Ambulatory Visit (HOSPITAL_COMMUNITY)
Admission: RE | Admit: 2014-07-07 | Discharge: 2014-07-07 | Disposition: A | Discharge status: Home / Self Care | Payer: BC Managed Care – PPO | Source: Ambulatory Visit | Attending: Gastroenterology | Admitting: Gastroenterology

## 2014-07-07 DIAGNOSIS — R109 Unspecified abdominal pain: Secondary | ICD-10-CM | POA: Diagnosis not present

## 2014-07-07 DIAGNOSIS — R143 Flatulence: Secondary | ICD-10-CM

## 2014-07-07 DIAGNOSIS — R112 Nausea with vomiting, unspecified: Secondary | ICD-10-CM | POA: Insufficient documentation

## 2014-07-07 MED ORDER — SINCALIDE 5 MCG IJ SOLR
0.0200 ug/kg | Freq: Once | INTRAMUSCULAR | Status: AC
  Administered 2014-07-07: 2 ug via INTRAVENOUS

## 2014-07-07 MED ORDER — TECHNETIUM TC 99M MEBROFENIN IV KIT
5.0000 | PACK | Freq: Once | INTRAVENOUS | Status: AC | PRN
  Administered 2014-07-07: 5 via INTRAVENOUS

## 2015-09-15 ENCOUNTER — Other Ambulatory Visit: Payer: Self-pay | Admitting: Gastroenterology

## 2016-04-29 ENCOUNTER — Encounter (HOSPITAL_COMMUNITY): Payer: Self-pay | Admitting: Oncology

## 2016-04-29 ENCOUNTER — Emergency Department (HOSPITAL_COMMUNITY): Payer: Medicare Other

## 2016-04-29 ENCOUNTER — Emergency Department (HOSPITAL_COMMUNITY)
Admission: EM | Admit: 2016-04-29 | Discharge: 2016-04-29 | Disposition: A | Discharge status: Home / Self Care | Payer: Medicare Other | Attending: Emergency Medicine | Admitting: Emergency Medicine

## 2016-04-29 DIAGNOSIS — Z7984 Long term (current) use of oral hypoglycemic drugs: Secondary | ICD-10-CM | POA: Insufficient documentation

## 2016-04-29 DIAGNOSIS — I129 Hypertensive chronic kidney disease with stage 1 through stage 4 chronic kidney disease, or unspecified chronic kidney disease: Secondary | ICD-10-CM | POA: Diagnosis not present

## 2016-04-29 DIAGNOSIS — Y9301 Activity, walking, marching and hiking: Secondary | ICD-10-CM | POA: Diagnosis not present

## 2016-04-29 DIAGNOSIS — Z7982 Long term (current) use of aspirin: Secondary | ICD-10-CM | POA: Diagnosis not present

## 2016-04-29 DIAGNOSIS — N182 Chronic kidney disease, stage 2 (mild): Secondary | ICD-10-CM | POA: Insufficient documentation

## 2016-04-29 DIAGNOSIS — Y999 Unspecified external cause status: Secondary | ICD-10-CM | POA: Insufficient documentation

## 2016-04-29 DIAGNOSIS — M79642 Pain in left hand: Secondary | ICD-10-CM

## 2016-04-29 DIAGNOSIS — W19XXXA Unspecified fall, initial encounter: Secondary | ICD-10-CM

## 2016-04-29 DIAGNOSIS — S4992XA Unspecified injury of left shoulder and upper arm, initial encounter: Secondary | ICD-10-CM | POA: Diagnosis present

## 2016-04-29 DIAGNOSIS — S52135A Nondisplaced fracture of neck of left radius, initial encounter for closed fracture: Secondary | ICD-10-CM | POA: Diagnosis not present

## 2016-04-29 DIAGNOSIS — E1122 Type 2 diabetes mellitus with diabetic chronic kidney disease: Secondary | ICD-10-CM | POA: Insufficient documentation

## 2016-04-29 DIAGNOSIS — W1839XA Other fall on same level, initial encounter: Secondary | ICD-10-CM | POA: Insufficient documentation

## 2016-04-29 DIAGNOSIS — Y929 Unspecified place or not applicable: Secondary | ICD-10-CM | POA: Insufficient documentation

## 2016-04-29 MED ORDER — MORPHINE SULFATE (PF) 4 MG/ML IV SOLN
4.0000 mg | Freq: Once | INTRAVENOUS | Status: AC
  Administered 2016-04-29: 4 mg via INTRAMUSCULAR
  Filled 2016-04-29: qty 1

## 2016-04-29 MED ORDER — HYDROCODONE-ACETAMINOPHEN 5-325 MG PO TABS
1.0000 | ORAL_TABLET | Freq: Once | ORAL | Status: AC
  Administered 2016-04-29: 1 via ORAL
  Filled 2016-04-29: qty 1

## 2016-04-29 MED ORDER — HYDROCODONE-ACETAMINOPHEN 5-325 MG PO TABS: 1.0000 | ORAL_TABLET | ORAL | 0 refills | Status: DC | PRN

## 2016-04-29 NOTE — ED Notes (Signed)
Ortho Tech paged.

## 2016-04-29 NOTE — ED Provider Notes (Signed)
Patient seen/examined in the Emergency Department in conjunction with Midlevel Provider [facility] Patient reports left arm pain/chest wall pain after mechanical fall.   Exam : awake/alert, tenderness to left wrist/forearm and left chest wall.  No crepitus to chest wall.  No LUQ tenderness to abdomen.  No bruising noted Plan: imaging reviewed.  Will need splint/sling for left UE.      Ripley Fraise, MD 04/29/16 [phone]

## 2016-04-29 NOTE — ED Notes (Signed)
Pt in X-Ray ?

## 2016-04-29 NOTE — ED Provider Notes (Signed)
[facility] DEPT Provider Note   CSN: [id number] Arrival date & time: 04/29/16  0032   By signing my name below, I, Eunice Blase, attest that this documentation has been prepared under the direction and in the presence of Gloriann Loan, PA-C. Electronically Signed: Eunice Blase, Scribe. 04/29/16. 3:01 AM.   History   Chief Complaint Chief Complaint  Patient presents with  . Arm Pain   The history is provided by the patient. No language interpreter was used.    HPI Comments: Nathan Medina is a 65 y.o. male who presents to the Emergency Department s/p a a fall that he sustained today. He states that he was walking today when he slipped on a speed bump and fell on his left side. Hit the left side of his head during the fall, but he denies LOC. Pt currently c/o 6/10 left arm, wrist, hand, elbow, ribs, and knee pain. Associated swelling on the left hand and wrist.   He states that he has been icing the area without relief, and he took 1000 mg of tylenol ~7:00 PM this evening, which provided temporary relief. Pt denies CP, SOB, HA, neck pain, back pain, visual changes, N/V.  He is ambulatory.    Past Medical History:  Diagnosis Date  . Abnormal liver function test 05/23/2011  . ALLERGIC RHINITIS   . ANXIETY   . Arthritis   . Chronic kidney disease    CKD stage 2 per office visit note 08/01/12 on chart   . COLONIC POLYPS, HX OF   . Complication of anesthesia    slow to wake up   . DEPRESSION   . DIABETES MELLITUS, TYPE II   . GERD   . GOUT   . HYPERLIPIDEMIA   . HYPERTENSION   . IBS   . 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 Left renal mass 05/23/2011  . Lumbar radicular pain 06/03/2011  . Shortness of breath    occasional shortness of breath     Patient Active Problem List   Diagnosis Date Noted  . Back pain 04/03/2012  . Ventral hernia 10/22/2011  . Lumbar radicular pain 06/03/2011    . PSA elevation 06/01/2011  . Left renal mass 05/23/2011  . Abnormal liver function test 05/23/2011  . Rash 04/02/2011  . Hearing loss 11/28/2010  . Preventative health care 09/17/2010  . 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 PERIPHERAL EDEMA 11/04/2009  . DIABETES MELLITUS, TYPE II 06/25/2007  . HYPERLIPIDEMIA 06/25/2007  . GOUT 06/25/2007  . ANXIETY 06/25/2007  . DEPRESSION 06/25/2007  . HYPERTENSION 06/25/2007  . ALLERGIC RHINITIS 06/25/2007  . GERD 06/25/2007  . IBS 06/25/2007  . COLONIC POLYPS, HX OF 06/25/2007    Past Surgical History:  Procedure Laterality Date  . APPENDECTOMY  02/12/11  . COLON SURGERY  08/11/10   sigmoid colectomy  . COLON SURGERY  02/12/11   colostomy takedown  . HERNIA REPAIR    . INSERTION OF MESH N/A 08/20/2012   Procedure: INSERTION OF MESH;  Surgeon: [name], MD;  Location: [facility];  Service: General;  Laterality: N/A;  . LYSIS OF ADHESION  08/20/2012   Procedure: LYSIS OF ADHESION;  Surgeon: [name], MD;  Location: [facility];  Service: General;;  . SEPTOPLASTY    . VENTRAL HERNIA REPAIR  08/20/2012   Procedure: HERNIA REPAIR VENTRAL ADULT;  Surgeon: [name], MD;  Location: [facility];  Service:  General;;       Home Medications    Prior to Admission medications   Medication Sig Start Date End Date Taking? Authorizing Provider  acetaminophen (TYLENOL) 500 MG tablet Take 500 mg by mouth every 6 (six) hours as needed for pain.    Historical Provider, MD  allopurinol (ZYLOPRIM) 300 MG tablet TAKE 1 TABLET BY MOUTH EVERY DAY 05/16/13   Biagio Borg, MD  ALPRAZolam Duanne Moron) 0.5 MG tablet Take 1 tablet (0.5 mg total) by mouth daily as needed for sleep. 1/2 - 1 by mouth once daily as needed 04/03/12   Biagio Borg, MD  AMLODIPINE BESYLATE PO Take 10 mg by mouth daily.     Historical Provider, MD  aspirin 81 MG EC tablet Take 81 mg by mouth daily.     Historical Provider, MD  cyclobenzaprine (FLEXERIL) 10 MG tablet Take 1 tablet (10 mg  total) by mouth 3 (three) times daily as needed for muscle spasms. 08/25/12   Autumn Messing III, MD  cyclobenzaprine (FLEXERIL) 5 MG tablet Take 1 tablet (5 mg total) by mouth 3 (three) times daily as needed for muscle spasms. 04/03/12   Biagio Borg, MD  diphenhydrAMINE (SOMINEX) 25 MG tablet Take 25 mg by mouth at bedtime as needed for sleep.    Historical Provider, MD  doxazosin (CARDURA) 4 MG tablet Take 8 mg by mouth at bedtime. Take 2 by mouth at bedtime    Historical Provider, MD  EFFER-K 20 MEQ TBEF Take 20 mEq by mouth daily.  09/28/11   Historical Provider, MD  furosemide (LASIX) 40 MG tablet 80 mg. 2 tablets in the morning 04/09/12   Biagio Borg, MD  furosemide (LASIX) 40 MG tablet TAKE 2 TABLETS BY MOUTH EVERY MORNING AND 1 TABLET BY MOUTH AFTER LUNCH 05/16/13   Biagio Borg, MD  glimepiride (AMARYL) 4 MG tablet Take 8 mg by mouth daily before breakfast.    Historical Provider, MD  glucose blood (ONE TOUCH ULTRA TEST) test strip Use as directed 1 per day 04/02/11 04/01/12  Biagio Borg, MD  GuaiFENesin (MUCINEX PO) Take by mouth daily. Patient takes as needed    Historical Provider, MD  HYDROcodone-acetaminophen (NORCO/VICODIN) 5-325 MG tablet Take 1 tablet by mouth every 4 (four) hours as needed. 04/29/16   Gloriann Loan, PA-C  Lancets MISC 1 application by Does not apply route daily. 04/02/11   Biagio Borg, MD  Loratadine (CLARITIN PO) Take 10 mg by mouth daily. Patient takes as needed    Historical Provider, MD  metFORMIN (GLUCOPHAGE-XR) 500 MG 24 hr tablet Take 3 tablets (1,500 mg total) by mouth daily with breakfast. 05/16/12   Biagio Borg, MD  MICARDIS 80 MG tablet TAKE 1 TABLET BY MOUTH EVERY DAY 04/29/12   Biagio Borg, MD  Multiple Vitamin (MULTIVITAMIN) tablet Take 1 tablet by mouth daily.     Historical Provider, MD  Nebivolol HCl (BYSTOLIC) 20 MG TABS Take 1 tablet (20 mg total) by mouth daily. 10/15/12   Biagio Borg, MD  Omeprazole 20 MG TBEC TAKE 1 TABLET BY MOUTH EVERY DAY  11/04/12   Biagio Borg, MD  pioglitazone (ACTOS) 15 MG tablet Take 1 tablet (15 mg total) by mouth daily. 04/03/12   Biagio Borg, MD  spironolactone (ALDACTONE) 25 MG tablet Take 25 mg by mouth every morning.  05/28/12   Historical Provider, MD  tadalafil (CIALIS) 20 MG tablet Take 20 mg by mouth daily as needed.  Historical Provider, MD  traMADol (ULTRAM) 50 MG tablet Take 1 tablet (50 mg total) by mouth every 6 (six) hours as needed for pain. Maximum dose= 8 tablets per day 04/03/12   Biagio Borg, MD  vitamin E 100 UNIT capsule Take 400 Units by mouth daily.     Historical Provider, MD    Family History Family History  Problem Relation Age of Onset  . Hypertension Paternal Aunt   . Stroke Maternal Grandmother   . Hypertension Maternal Grandmother     Social History Social History  Substance Use Topics  . Smoking status: Never Smoker  . Smokeless tobacco: Never Used  . Alcohol use Yes     Comment: occasional      Allergies   Escitalopram oxalate; Quinapril hcl; Ciprofloxacin; and Sulfa antibiotics   Review of Systems Review of Systems A complete 10 system review of systems was obtained and all systems are negative except as noted in the HPI and PMH.    Physical Exam Updated Vital Signs BP 126/83   Pulse 82   Temp 98 F (36.7 C) (Oral)   Resp 20   Ht 5\' 8"  (1.727 m)   Wt 104.3 kg   SpO2 100%   BMI 34.97 kg/m   Physical Exam  Constitutional: He is oriented to person, place, and time. He appears well-developed and well-nourished.  HENT:  Head: Normocephalic and atraumatic.  Right Ear: External ear normal.  Left Ear: External ear normal.  Eyes: Conjunctivae and EOM are normal. No scleral icterus.  Neck: Normal range of motion. No tracheal deviation present.  Cardiovascular: Normal rate and regular rhythm.   Pulses:      Radial pulses are 2+ on the right side, and 2+ on the left side.  Brisk capillary refill.   Pulmonary/Chest: Effort normal and breath sounds  normal. No respiratory distress.  Abdominal: Soft. He exhibits no distension. There is no tenderness.  Musculoskeletal: Normal range of motion. He exhibits edema, tenderness and deformity.  Left hand with swelling and bruising.  Tenderness over MCPs.  Compartments soft and compressible.   Left elbow TTP at olecranon. No left shoulder tenderness No C/T/L midline tenderness.  No left hip tenderness.   Neurological: He is alert and oriented to person, place, and time.  Skin: Skin is warm and dry.  Psychiatric: He has a normal mood and affect. His behavior is normal.  Nursing note and vitals reviewed.    ED Treatments / Results  DIAGNOSTIC STUDIES: Oxygen Saturation is 99% on RA, normal by my interpretation.    COORDINATION OF CARE: 3:01 AM Will order imaging and pain medications. Discussed treatment plan with pt at bedside and pt agreed to plan.  Labs (all labs ordered are listed, but only abnormal results are displayed) Labs Reviewed - No data to display  EKG  EKG Interpretation None       Radiology Dg Chest 2 View  Result Date: 04/29/2016 CLINICAL DATA:  Fall, shortness of breath. EXAM: CHEST  2 VIEW COMPARISON:  09/11/2012 FINDINGS: Low lung volumes persist. Heart appears prominent size, however unchanged from prior. There is tortuosity of the thoracic aorta. No pneumothorax, focal airspace disease, pleural fluid or pulmonary edema. Minimal left lung base atelectasis. No acute osseous abnormalities are seen. IMPRESSION: Low lung volumes.  No acute abnormality. Electronically Signed   By: Jeb Levering M.D.   On: 04/29/2016 02:00   Dg Elbow Complete Left  Result Date: 04/29/2016 CLINICAL DATA:  Fall yesterday with left hand,  wrist, forearm,elbow, and humerus pain. EXAM: LEFT ELBOW - COMPLETE 3+ VIEW COMPARISON:  None. FINDINGS: Nondisplaced radial neck fracture. Small elbow joint effusion. No additional fracture of the elbow. The alignment and joint spaces are maintained.  IMPRESSION: Nondisplaced radial neck fracture with associated joint effusion. Electronically Signed   By: Jeb Levering M.D.   On: 04/29/2016 01:58   Dg Forearm Left  Result Date: 04/29/2016 CLINICAL DATA:  Fall yesterday with left hand, wrist, forearm, elbow, and humerus pain. EXAM: LEFT FOREARM - 2 VIEW COMPARISON:  No prior exams. Performed concurrently with wrist and elbow radiographs. FINDINGS: Radial neck fracture better assessed on dedicated elbow radiographs. The distal radius is intact. Remote fracture of the distal radial metaphysis with mild osseous remottling. The ulna is intact. No focal soft tissue abnormality. IMPRESSION: Radial neck fracture better assessed on dedicated elbow radiographs. No additional forearm fracture. Electronically Signed   By: Jeb Levering M.D.   On: 04/29/2016 01:52   Dg Wrist Complete Left  Result Date: 04/29/2016 CLINICAL DATA:  Fall yesterday with left hand, wrist, forearm,elbow, and humerus pain. EXAM: LEFT WRIST - COMPLETE 3+ VIEW COMPARISON:  None. FINDINGS: There is no evidence of fracture or dislocation. Remote distal radius fracture with mild osseous remottling. Scaphoid is intact. Soft tissues are unremarkable. IMPRESSION: No fracture or subluxation of the left wrist. Electronically Signed   By: Jeb Levering M.D.   On: 04/29/2016 01:58   Dg Humerus Left  Result Date: 04/29/2016 CLINICAL DATA:  Fall yesterday with left hand, wrist, forearm,elbow, and humerus pain. EXAM: LEFT HUMERUS - 2+ VIEW COMPARISON:  None. FINDINGS: There is no evidence of fracture or other focal bone lesions. Soft tissues are unremarkable. IMPRESSION: No fracture of the left humerus. Electronically Signed   By: Jeb Levering M.D.   On: 04/29/2016 02:00   Dg Hand Complete Left  Result Date: 04/29/2016 CLINICAL DATA:  Fall yesterday with left hand, wrist, forearm, elbow, and humerus pain. EXAM: LEFT HAND - COMPLETE 3+ VIEW COMPARISON:  None. FINDINGS: There is no  evidence of fracture or dislocation. Scattered osteoarthritis throughout the digits. Benign-appearing cortical thickening/ sclerosis involving the middle finger middle phalanx. Mild dorsal soft tissue edema. IMPRESSION: No fracture or subluxation of the left hand. Electronically Signed   By: Jeb Levering M.D.   On: 04/29/2016 01:56    Procedures Procedures (including critical care time)  SPLINT APPLICATION Date/Time: 0000000 AM Authorized by: Gloriann Loan Consent: Verbal consent obtained. Risks and benefits: risks, benefits and alternatives were discussed Consent given by: patient Splint applied by: orthopedic technician Location details: left arm Splint type: long arm, volar wrist Supplies used: orthoglass and ace wrap Post-procedure: The splinted body part was neurovascularly unchanged following the procedure. Patient tolerance: Patient tolerated the procedure well with no immediate complications.     Medications Ordered in ED Medications  HYDROcodone-acetaminophen (NORCO/VICODIN) 5-325 MG per tablet 1 tablet (not administered)  morphine 4 MG/ML injection 4 mg (4 mg Intramuscular Given 04/29/16 0139)     Initial Impression / Assessment and Plan / ED Course  I have reviewed the triage vital signs and the nursing notes.  Pertinent labs & imaging results that were available during my care of the patient were reviewed by me and considered in my medical decision making (see chart for details).  Clinical Course    Patient presents status post mechanical fall now with left elbow and hand pain. Neurovascularly intact. Compartments are soft and compressible. Plain films obtained and remarkable for a  radial neck fracture. Patient was placed in a long-arm splint that extended to the volar wrist splint. Home with short course of Norco. Patient given follow-up with hand surgery. Return precautions discussed. Stable for discharge.  Case has been discussed with and seen by Dr. Christy Gentles who  agrees with the above plan for discharge.   Final Clinical Impressions(s) / ED Diagnoses   Final diagnoses:  Closed nondisplaced fracture of neck of left radius, initial encounter  Fall, initial encounter  Left hand pain    New Prescriptions New Prescriptions   HYDROCODONE-ACETAMINOPHEN (NORCO/VICODIN) 5-325 MG TABLET    Take 1 tablet by mouth every 4 (four) hours as needed.   I personally performed the services described in this documentation, which was scribed in my presence. The recorded information has been reviewed and is accurate.    Gloriann Loan, PA-C 04/29/16 0303    Gloriann Loan, PA-C 04/29/16 [id number]    Ripley Fraise, MD 04/29/16 [phone]

## 2016-04-29 NOTE — ED Triage Notes (Signed)
Per pt he fell yesterday while walking over a slippery speed bump.  Pt c/o left arm/wrist/hand, left rib pain.  Rates pain 6/10.  Swelling noted to left hand and wrist.  Pt also states he hit his head however did not have LOC.  Pt has been icing his arm as well as taking tylenol w/o relief of the pain.  Pt states he doesn't have, "Much control," over left arm.  Has abrasions to left knee as well.

## 2016-07-31 ENCOUNTER — Emergency Department (HOSPITAL_COMMUNITY): Payer: Medicare Other

## 2016-07-31 ENCOUNTER — Inpatient Hospital Stay (HOSPITAL_COMMUNITY)
Admission: EM | Admit: 2016-07-31 | Discharge: 2016-08-03 | DRG: 133 | Disposition: A | Discharge status: Home / Self Care | Payer: Medicare Other | Attending: Internal Medicine | Admitting: Internal Medicine

## 2016-07-31 ENCOUNTER — Encounter (HOSPITAL_COMMUNITY): Payer: Self-pay

## 2016-07-31 DIAGNOSIS — R739 Hyperglycemia, unspecified: Secondary | ICD-10-CM | POA: Diagnosis not present

## 2016-07-31 DIAGNOSIS — K219 Gastro-esophageal reflux disease without esophagitis: Secondary | ICD-10-CM | POA: Diagnosis present

## 2016-07-31 DIAGNOSIS — Z7982 Long term (current) use of aspirin: Secondary | ICD-10-CM

## 2016-07-31 DIAGNOSIS — N183 Chronic kidney disease, stage 3 (moderate): Secondary | ICD-10-CM | POA: Diagnosis present

## 2016-07-31 DIAGNOSIS — I959 Hypotension, unspecified: Secondary | ICD-10-CM | POA: Diagnosis present

## 2016-07-31 DIAGNOSIS — Z86010 Personal history of colonic polyps: Secondary | ICD-10-CM

## 2016-07-31 DIAGNOSIS — Z880 Allergy status to penicillin: Secondary | ICD-10-CM | POA: Diagnosis not present

## 2016-07-31 DIAGNOSIS — E785 Hyperlipidemia, unspecified: Secondary | ICD-10-CM | POA: Diagnosis present

## 2016-07-31 DIAGNOSIS — E1129 Type 2 diabetes mellitus with other diabetic kidney complication: Secondary | ICD-10-CM | POA: Diagnosis present

## 2016-07-31 DIAGNOSIS — Z79899 Other long term (current) drug therapy: Secondary | ICD-10-CM | POA: Diagnosis not present

## 2016-07-31 DIAGNOSIS — E1122 Type 2 diabetes mellitus with diabetic chronic kidney disease: Secondary | ICD-10-CM | POA: Diagnosis present

## 2016-07-31 DIAGNOSIS — Z823 Family history of stroke: Secondary | ICD-10-CM

## 2016-07-31 DIAGNOSIS — Z888 Allergy status to other drugs, medicaments and biological substances status: Secondary | ICD-10-CM | POA: Diagnosis not present

## 2016-07-31 DIAGNOSIS — Z882 Allergy status to sulfonamides status: Secondary | ICD-10-CM | POA: Diagnosis not present

## 2016-07-31 DIAGNOSIS — J36 Peritonsillar abscess: Principal | ICD-10-CM | POA: Diagnosis present

## 2016-07-31 DIAGNOSIS — E1165 Type 2 diabetes mellitus with hyperglycemia: Secondary | ICD-10-CM | POA: Diagnosis present

## 2016-07-31 DIAGNOSIS — E875 Hyperkalemia: Secondary | ICD-10-CM | POA: Diagnosis present

## 2016-07-31 DIAGNOSIS — Z8249 Family history of ischemic heart disease and other diseases of the circulatory system: Secondary | ICD-10-CM | POA: Diagnosis not present

## 2016-07-31 DIAGNOSIS — I10 Essential (primary) hypertension: Secondary | ICD-10-CM | POA: Diagnosis present

## 2016-07-31 DIAGNOSIS — I129 Hypertensive chronic kidney disease with stage 1 through stage 4 chronic kidney disease, or unspecified chronic kidney disease: Secondary | ICD-10-CM | POA: Diagnosis present

## 2016-07-31 DIAGNOSIS — R52 Pain, unspecified: Secondary | ICD-10-CM

## 2016-07-31 DIAGNOSIS — Z794 Long term (current) use of insulin: Secondary | ICD-10-CM | POA: Diagnosis not present

## 2016-07-31 DIAGNOSIS — N179 Acute kidney failure, unspecified: Secondary | ICD-10-CM | POA: Diagnosis present

## 2016-07-31 DIAGNOSIS — E1121 Type 2 diabetes mellitus with diabetic nephropathy: Secondary | ICD-10-CM | POA: Diagnosis not present

## 2016-07-31 DIAGNOSIS — E860 Dehydration: Secondary | ICD-10-CM | POA: Diagnosis present

## 2016-07-31 DIAGNOSIS — N189 Chronic kidney disease, unspecified: Secondary | ICD-10-CM

## 2016-07-31 DIAGNOSIS — N178 Other acute kidney failure: Secondary | ICD-10-CM | POA: Diagnosis not present

## 2016-07-31 DIAGNOSIS — R000 Tachycardia, unspecified: Secondary | ICD-10-CM | POA: Diagnosis present

## 2016-07-31 LAB — CBC WITH DIFFERENTIAL/PLATELET
Basophils Absolute: 0 10*3/uL (ref 0.0–0.1)
Basophils Relative: 0 %
Eosinophils Absolute: 0 10*3/uL (ref 0.0–0.7)
Eosinophils Relative: 0 %
HCT: 47.5 % (ref 39.0–52.0)
Hemoglobin: 16.4 g/dL (ref 13.0–17.0)
Lymphocytes Relative: 13 %
Lymphs Abs: 1.7 10*3/uL (ref 0.7–4.0)
MCH: 31.7 pg (ref 26.0–34.0)
MCHC: 34.5 g/dL (ref 30.0–36.0)
MCV: 91.9 fL (ref 78.0–100.0)
Monocytes Absolute: 1.2 10*3/uL — ABNORMAL HIGH (ref 0.1–1.0)
Monocytes Relative: 9 %
Neutro Abs: 10.1 10*3/uL — ABNORMAL HIGH (ref 1.7–7.7)
Neutrophils Relative %: 78 %
Platelets: 197 10*3/uL (ref 150–400)
RBC: 5.17 MIL/uL (ref 4.22–5.81)
RDW: 14.8 % (ref 11.5–15.5)
WBC: 13 10*3/uL — ABNORMAL HIGH (ref 4.0–10.5)

## 2016-07-31 LAB — CBG MONITORING, ED
Glucose-Capillary: 296 mg/dL — ABNORMAL HIGH (ref 65–99)
Glucose-Capillary: 234 mg/dL — ABNORMAL HIGH (ref 65–99)

## 2016-07-31 LAB — COMPREHENSIVE METABOLIC PANEL
ALT: 33 U/L (ref 17–63)
AST: 25 U/L (ref 15–41)
Albumin: 3.1 g/dL — ABNORMAL LOW (ref 3.5–5.0)
Alkaline Phosphatase: 108 U/L (ref 38–126)
Anion gap: 11 (ref 5–15)
BUN: 56 mg/dL — ABNORMAL HIGH (ref 6–20)
CO2: 20 mmol/L — ABNORMAL LOW (ref 22–32)
Calcium: 9.2 mg/dL (ref 8.9–10.3)
Chloride: 105 mmol/L (ref 101–111)
Creatinine, Ser: 2.36 mg/dL — ABNORMAL HIGH (ref 0.61–1.24)
GFR calc Af Amer: 31 mL/min — ABNORMAL LOW (ref >60)
GFR calc non Af Amer: 27 mL/min — ABNORMAL LOW (ref >60)
Glucose, Bld: 253 mg/dL — ABNORMAL HIGH (ref 65–99)
Potassium: 5.1 mmol/L (ref 3.5–5.1)
Sodium: 136 mmol/L (ref 135–145)
Total Bilirubin: 1.4 mg/dL — ABNORMAL HIGH (ref 0.3–1.2)
Total Protein: 6.7 g/dL (ref 6.5–8.1)

## 2016-07-31 LAB — I-STAT CG4 LACTIC ACID, ED
Lactic Acid, Venous: 1.99 mmol/L (ref 0.5–1.9)
Lactic Acid, Venous: 2.13 mmol/L (ref 0.5–1.9)

## 2016-07-31 MED ORDER — CLINDAMYCIN PHOSPHATE 900 MG/50ML IV SOLN
900.0000 mg | Freq: Once | INTRAVENOUS | Status: AC
  Administered 2016-07-31: 900 mg via INTRAVENOUS
  Filled 2016-07-31: qty 50

## 2016-07-31 MED ORDER — MORPHINE SULFATE (PF) 4 MG/ML IV SOLN
2.0000 mg | Freq: Once | INTRAVENOUS | Status: AC
  Administered 2016-07-31: 2 mg via INTRAVENOUS
  Filled 2016-07-31: qty 1

## 2016-07-31 MED ORDER — IOPAMIDOL (ISOVUE-300) INJECTION 61%
INTRAVENOUS | Status: AC
  Filled 2016-07-31: qty 75

## 2016-07-31 MED ORDER — SODIUM CHLORIDE 0.9 % IV BOLUS (SEPSIS)
1000.0000 mL | Freq: Once | INTRAVENOUS | Status: AC
  Administered 2016-07-31: 1000 mL via INTRAVENOUS

## 2016-07-31 MED ORDER — SODIUM CHLORIDE 0.9 % IV SOLN
1000.0000 mL | INTRAVENOUS | Status: DC
  Administered 2016-07-31: 1000 mL via INTRAVENOUS

## 2016-07-31 NOTE — ED Provider Notes (Signed)
[facility] DEPT Provider Note   CSN: [id number] Arrival date & time: 07/31/16  1548     History   Chief Complaint Chief Complaint  Patient presents with  . Hypotension  . Hyperglycemia  . peritonsilar abscess    HPI Nathan Medina is a 66 y.o. male.  HPI  The patient is a 66 year old male, he has a known history of diabetes as well as hyperlipidemia, hypertension, chronic kidney disease stage II as well as a history of a sore throat which started approximately a week ago. He was initially seen in the doctor's office and given a prescription for amoxicillin even though he tested negative for amoxicillin however over the last several days he has had worsening of the sore throat especially on the right side. There is some difficulty with swallowing, some difficulty with speaking and has had progressive dry mouth with increasing blood sugars despite using his Lantus. The symptoms are gradually worsening and are now severe. He was seen in the [place] walk-in clinic and was sent to the emergency department for further evaluation of his peritonsillar abscess.  Past Medical History:  Diagnosis Date  . Abnormal liver function test 05/23/2011  . ALLERGIC RHINITIS   . ANXIETY   . Arthritis   . Chronic kidney disease    CKD stage 2 per office visit note 08/01/12 on chart   . COLONIC POLYPS, HX OF   . Complication of anesthesia    slow to wake up   . DEPRESSION   . DIABETES MELLITUS, TYPE II   . GERD   . GOUT   . HYPERLIPIDEMIA   . HYPERTENSION   . IBS   . 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 Left renal mass 05/23/2011  . Lumbar radicular pain 06/03/2011  . Shortness of breath    occasional shortness of breath     Patient Active Problem List   Diagnosis Date Noted  . Back pain 04/03/2012  . Ventral hernia 10/22/2011  . Lumbar radicular pain 06/03/2011  . PSA elevation 06/01/2011  . Left  renal mass 05/23/2011  . Abnormal liver function test 05/23/2011  . Rash 04/02/2011  . Hearing loss 11/28/2010  . Preventative health care 09/17/2010  . 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 PERIPHERAL EDEMA 11/04/2009  . DIABETES MELLITUS, TYPE II 06/25/2007  . HYPERLIPIDEMIA 06/25/2007  . GOUT 06/25/2007  . ANXIETY 06/25/2007  . DEPRESSION 06/25/2007  . HYPERTENSION 06/25/2007  . ALLERGIC RHINITIS 06/25/2007  . GERD 06/25/2007  . IBS 06/25/2007  . COLONIC POLYPS, HX OF 06/25/2007    Past Surgical History:  Procedure Laterality Date  . APPENDECTOMY  02/12/11  . COLON SURGERY  08/11/10   sigmoid colectomy  . COLON SURGERY  02/12/11   colostomy takedown  . HERNIA REPAIR    . INSERTION OF MESH N/A 08/20/2012   Procedure: INSERTION OF MESH;  Surgeon: [name], MD;  Location: [facility];  Service: General;  Laterality: N/A;  . LYSIS OF ADHESION  08/20/2012   Procedure: LYSIS OF ADHESION;  Surgeon: [name], MD;  Location: [facility];  Service: General;;  . SEPTOPLASTY    . VENTRAL HERNIA REPAIR  08/20/2012   Procedure: HERNIA REPAIR VENTRAL ADULT;  Surgeon: [name], MD;  Location: [facility];  Service: General;;       Home Medications    Prior to Admission medications   Medication Sig Start Date End  Date Taking? Authorizing Provider  acetaminophen (TYLENOL) 500 MG tablet Take 1,000 mg by mouth every 6 (six) hours as needed for mild pain, moderate pain or headache.    Yes Historical Provider, MD  allopurinol (ZYLOPRIM) 300 MG tablet TAKE 1 TABLET BY MOUTH EVERY DAY 05/16/13  Yes Biagio Borg, MD  ALPRAZolam Duanne Moron) 0.5 MG tablet Take 1 tablet (0.5 mg total) by mouth daily as needed for sleep. 1/2 - 1 by mouth once daily as needed Patient taking differently: Take 0.25-0.5 mg by mouth at bedtime as needed for sleep.  04/03/12  Yes Biagio Borg, MD  aspirin EC 81 MG tablet Take 81 mg by mouth every other day.   Yes Historical Provider, MD  carvedilol (COREG) 6.25 MG tablet  Take 6.25 mg by mouth 2 (two) times daily with a meal.   Yes Historical Provider, MD  cyclobenzaprine (FLEXERIL) 10 MG tablet Take 1 tablet (10 mg total) by mouth 3 (three) times daily as needed for muscle spasms. 08/25/12  Yes Autumn Messing III, MD  diphenhydrAMINE (BENADRYL) 25 MG tablet Take 25 mg by mouth at bedtime as needed for sleep.   Yes Historical Provider, MD  empagliflozin (JARDIANCE) 25 MG TABS tablet Take 25 mg by mouth daily.   Yes Historical Provider, MD  furosemide (LASIX) 40 MG tablet Take 40 mg by mouth daily.   Yes Historical Provider, MD  guaiFENesin (MUCINEX) 600 MG 12 hr tablet Take 600 mg by mouth 2 (two) times daily as needed for cough or to loosen phlegm.   Yes Historical Provider, MD  insulin glargine (LANTUS) 100 unit/mL SOPN Inject 12 Units into the skin at bedtime.   Yes Historical Provider, MD  metformin (FORTAMET) 1000 MG (OSM) 24 hr tablet Take 1,000 mg by mouth 2 (two) times daily with a meal.   Yes Historical Provider, MD  MICARDIS 80 MG tablet TAKE 1 TABLET BY MOUTH EVERY DAY Patient taking differently: TAKE 1 TABLET BY MOUTH AT BEDTIME 04/29/12  Yes Biagio Borg, MD  Multiple Vitamin (MULTIVITAMIN WITH MINERALS) TABS tablet Take 1 tablet by mouth daily.   Yes Historical Provider, MD  omeprazole (PRILOSEC) 20 MG capsule Take 20 mg by mouth daily.   Yes Historical Provider, MD  potassium chloride SA (K-DUR,KLOR-CON) 20 MEQ tablet Take 20 mEq by mouth daily.   Yes Historical Provider, MD  spironolactone (ALDACTONE) 25 MG tablet Take 25 mg by mouth daily.    Yes Historical Provider, MD  tadalafil (CIALIS) 20 MG tablet Take 20 mg by mouth daily as needed for erectile dysfunction.    Yes Historical Provider, MD  traMADol (ULTRAM) 50 MG tablet Take 1 tablet (50 mg total) by mouth every 6 (six) hours as needed for pain. Maximum dose= 8 tablets per day 04/03/12  Yes Biagio Borg, MD  vitamin E 400 UNIT capsule Take 400 Units by mouth daily.   Yes Historical Provider, MD     Family History Family History  Problem Relation Age of Onset  . Hypertension Paternal Aunt   . Stroke Maternal Grandmother   . Hypertension Maternal Grandmother     Social History Social History  Substance Use Topics  . Smoking status: Never Smoker  . Smokeless tobacco: Never Used  . Alcohol use Yes     Comment: occasional      Allergies   Amoxicillin; Escitalopram oxalate; Ciprofloxacin; Quinapril hcl; and Sulfa antibiotics   Review of Systems Review of Systems  All other systems reviewed and are  negative.    Physical Exam Updated Vital Signs BP 114/81   Pulse 75   Temp 97.7 F (36.5 C) (Oral)   Resp 24   Ht 5\' 8"  (1.727 m)   Wt 216 lb (98 kg)   SpO2 94%   BMI 32.84 kg/m   Physical Exam  Constitutional: He appears well-developed and well-nourished. No distress.  HENT:  Head: Normocephalic and atraumatic.  Mouth/Throat: No oropharyngeal exudate.  The neck is supple but there is pain with swallowing - he has some fullness in the R proximal anterior LAD - there is visible swelling of the R tonsillar pillar and soft palate - the MM are dry  Eyes: Conjunctivae and EOM are normal. Pupils are equal, round, and reactive to light. Right eye exhibits no discharge. Left eye exhibits no discharge. No scleral icterus.  Neck: Normal range of motion. Neck supple. No JVD present. No thyromegaly present.  Cardiovascular: Normal rate, regular rhythm, normal heart sounds and intact distal pulses.  Exam reveals no gallop and no friction rub.   No murmur heard. Pulmonary/Chest: Effort normal and breath sounds normal. No respiratory distress. He has no wheezes. He has no rales.  Abdominal: Soft. Bowel sounds are normal. He exhibits no distension and no mass. There is no tenderness.  Musculoskeletal: Normal range of motion. He exhibits no edema or tenderness.  Lymphadenopathy:    He has no cervical adenopathy.  Neurological: He is alert. Coordination normal.  Skin: Skin is  warm and dry. No rash noted. No erythema.  Psychiatric: He has a normal mood and affect. His behavior is normal.  Nursing note and vitals reviewed.    ED Treatments / Results  Labs (all labs ordered are listed, but only abnormal results are displayed) Labs Reviewed  CBC WITH DIFFERENTIAL/PLATELET - Abnormal; Notable for the following:       Result Value   WBC 13.0 (*)    Neutro Abs 10.1 (*)    Monocytes Absolute 1.2 (*)    All other components within normal limits  COMPREHENSIVE METABOLIC PANEL - Abnormal; Notable for the following:    CO2 20 (*)    Glucose, Bld 253 (*)    BUN 56 (*)    Creatinine, Ser 2.36 (*)    Albumin 3.1 (*)    Total Bilirubin 1.4 (*)    GFR calc non Af Amer 27 (*)    GFR calc Af Amer 31 (*)    All other components within normal limits  CBG MONITORING, ED - Abnormal; Notable for the following:    Glucose-Capillary 296 (*)    All other components within normal limits  I-STAT CG4 LACTIC ACID, ED - Abnormal; Notable for the following:    Lactic Acid, Venous 1.99 (*)    All other components within normal limits  I-STAT CG4 LACTIC ACID, ED - Abnormal; Notable for the following:    Lactic Acid, Venous 2.13 (*)    All other components within normal limits  CBG MONITORING, ED - Abnormal; Notable for the following:    Glucose-Capillary 234 (*)    All other components within normal limits  CULTURE, BLOOD (ROUTINE X 2)  CULTURE, BLOOD (ROUTINE X 2)  COMPREHENSIVE METABOLIC PANEL  I-STAT CG4 LACTIC ACID, ED    Radiology Ct Soft Tissue Neck Wo Contrast  Result Date: 07/31/2016 CLINICAL DATA:  66 y/o  M; sore throat and peritonsillar abscess. EXAM: CT NECK WITHOUT CONTRAST TECHNIQUE: Multidetector CT imaging of the neck was performed following the standard protocol  without intravenous contrast. COMPARISON:  None. FINDINGS: Pharynx and larynx: Marked swelling of the right palatine tonsil and right greater than left or pharyngeal mucosal thickening. There are foci  of hypoattenuation within the right palatine tonsil. Salivary glands: No inflammation, mass, or stone. Thyroid: Multiple thyroid nodules the largest on the left with coarse calcification measuring up to 25 mm. Lymph nodes: None enlarged or abnormal density. Vascular: Mild calcific atherosclerosis of the aortic arch and left carotid bifurcation. Limited intracranial: Negative. Visualized orbits: Negative. Mastoids and visualized paranasal sinuses: Clear. Skeleton: Moderate cervical spondylosis with discogenic degenerative changes greatest from the C4 through C7 levels where there is disc space narrowing and osteophytes. No high-grade bony canal stenosis or foraminal narrowing. Upper chest: 12 mm ground-glass nodule in the right lung apex (series 3, image 102). Other: None. IMPRESSION: 1. Marked swelling of the right palatine tonsil with foci of hypoattenuation probably representing abscess/phlegmonous change. Delineation of abscess is suboptimal in the absence of intravenous contrast. No extension into deep cervical compartments or retropharyngeal fluid collection. 2. Multiple thyroid nodules measuring up to 25 mm in the left lobe of thyroid. Further evaluation with thyroid ultrasound is recommended on a nonemergent basis. 3. 12 mm ground-glass nodule in the right lung apex. Initial follow-up with CT at 6-12 months is recommended to confirm persistence. If persistent, repeat CT is recommended every 2 years until 5 years of stability has been established. This recommendation follows the consensus statement: Guidelines for Management of Incidental Pulmonary Nodules Detected on CT Images: From the Fleischner Society 2017; Radiology 2017; 284:228-243. 4. Moderate cervical spondylosis. Electronically Signed   By: Kristine Garbe M.D.   On: 07/31/2016 21:34    Procedures Procedures (including critical care time)  Medications Ordered in ED Medications  0.9 %  sodium chloride infusion (1,000 mLs Intravenous  New Bag/Given 07/31/16 1743)  iopamidol (ISOVUE-300) 61 % injection (not administered)  sodium chloride 0.9 % bolus 1,000 mL (0 mLs Intravenous Stopped 07/31/16 1740)  clindamycin (CLEOCIN) IVPB 900 mg (0 mg Intravenous Stopped 07/31/16 1740)  sodium chloride 0.9 % bolus 1,000 mL (1,000 mLs Intravenous New Bag/Given 07/31/16 2140)     Initial Impression / Assessment and Plan / ED Course  I have reviewed the triage vital signs and the nursing notes.  Pertinent labs & imaging results that were available during my care of the patient were reviewed by me and considered in my medical decision making (see chart for details).  Clinical Course as of Aug 01 949  Tue Jul 31, 2016  2304 Creatinine: (!) 2.36 [AH]    Clinical Course User Index [AH] Margarita Mail, PA-C    There was report of hyperglycemia > 300, There was report of hypotension prehospital - fluids given Amoxicillin not working - change to Clindamycin and get CT of the neck Evaluate for abscess.  ENT as needed Pt in agreement with the plan.  The pt has had Clindamycin , lactic acid is over 2 and WBC 13,000 - he is not tachycardic, or febrile, AKI - likely related to dehydratdion - will need admission D/w Dr. Hal Hope - will admit.  ENT paged - no obvious large abscess - seems more phlegmonous and doesn't go into retropharyngeal space - AKI present and needs ongoing hydration.  Vitals:   07/31/16 2030 07/31/16 2042 07/31/16 2100 07/31/16 2130  BP: 101/69 101/69 115/83 114/81  Pulse: 77 82 80 75  Resp: 19 24    Temp:      TempSrc:  SpO2: 95% 94% 95% 94%  Weight:      Height:         Final Clinical Impressions(s) / ED Diagnoses   Final diagnoses:  Peritonsillar abscess  AKI (acute kidney injury) ([place])  Hyperglycemia  Dehydration    New Prescriptions New Prescriptions   No medications on file     Noemi Chapel, MD 08/01/16 [phone]

## 2016-07-31 NOTE — ED Notes (Signed)
Bed: CJ67 Expected date:  Expected time:  Means of arrival:  Comments: EMS-low spo2

## 2016-07-31 NOTE — ED Triage Notes (Signed)
Patient states unable to swallow and sharp pain in right ear drum.  Pt went to the [place] [facility] walk in clinic for pain.  Today is day four of taking Amoxicillin.  Pt denies testing positive for strep.

## 2016-07-31 NOTE — ED Triage Notes (Signed)
Per EMS- Patient went to [facility] physician's today for c/o SOB and sore throat. Patient was found to be hypotensive/82/50, hyperglycemic/332 and a peritonsillar abscess. Patient was given NS 500 ml IV prior to arrival to the ED.  BP increased to 106/74, 70-NSR with PVC's, and room air sats 92%.

## 2016-08-01 ENCOUNTER — Encounter (HOSPITAL_COMMUNITY)
Admission: EM | Disposition: A | Discharge status: Home / Self Care | Payer: Self-pay | Source: Home / Self Care | Attending: Internal Medicine

## 2016-08-01 ENCOUNTER — Inpatient Hospital Stay (HOSPITAL_COMMUNITY): Payer: Medicare Other | Admitting: Certified Registered Nurse Anesthetist

## 2016-08-01 ENCOUNTER — Encounter (HOSPITAL_COMMUNITY): Payer: Self-pay | Admitting: Internal Medicine

## 2016-08-01 DIAGNOSIS — I10 Essential (primary) hypertension: Secondary | ICD-10-CM

## 2016-08-01 DIAGNOSIS — E860 Dehydration: Secondary | ICD-10-CM

## 2016-08-01 DIAGNOSIS — J36 Peritonsillar abscess: Principal | ICD-10-CM

## 2016-08-01 DIAGNOSIS — N189 Chronic kidney disease, unspecified: Secondary | ICD-10-CM

## 2016-08-01 DIAGNOSIS — N179 Acute kidney failure, unspecified: Secondary | ICD-10-CM | POA: Diagnosis present

## 2016-08-01 DIAGNOSIS — E1121 Type 2 diabetes mellitus with diabetic nephropathy: Secondary | ICD-10-CM

## 2016-08-01 HISTORY — PX: TONSILLECTOMY: SHX5217

## 2016-08-01 LAB — COMPREHENSIVE METABOLIC PANEL
ALT: 31 U/L (ref 17–63)
AST: 21 U/L (ref 15–41)
Albumin: 2.8 g/dL — ABNORMAL LOW (ref 3.5–5.0)
Alkaline Phosphatase: 109 U/L (ref 38–126)
Anion gap: 9 (ref 5–15)
BUN: 55 mg/dL — ABNORMAL HIGH (ref 6–20)
CO2: 21 mmol/L — ABNORMAL LOW (ref 22–32)
Calcium: 8.9 mg/dL (ref 8.9–10.3)
Chloride: 109 mmol/L (ref 101–111)
Creatinine, Ser: 2.22 mg/dL — ABNORMAL HIGH (ref 0.61–1.24)
GFR calc Af Amer: 34 mL/min — ABNORMAL LOW (ref >60)
GFR calc non Af Amer: 29 mL/min — ABNORMAL LOW (ref >60)
Glucose, Bld: 224 mg/dL — ABNORMAL HIGH (ref 65–99)
Potassium: 4.6 mmol/L (ref 3.5–5.1)
Sodium: 139 mmol/L (ref 135–145)
Total Bilirubin: 1.1 mg/dL (ref 0.3–1.2)
Total Protein: 6.4 g/dL — ABNORMAL LOW (ref 6.5–8.1)

## 2016-08-01 LAB — GLUCOSE, CAPILLARY
Glucose-Capillary: 225 mg/dL — ABNORMAL HIGH (ref 65–99)
Glucose-Capillary: 195 mg/dL — ABNORMAL HIGH (ref 65–99)
Glucose-Capillary: 193 mg/dL — ABNORMAL HIGH (ref 65–99)
Glucose-Capillary: 177 mg/dL — ABNORMAL HIGH (ref 65–99)
Glucose-Capillary: 187 mg/dL — ABNORMAL HIGH (ref 65–99)
Glucose-Capillary: 211 mg/dL — ABNORMAL HIGH (ref 65–99)
Glucose-Capillary: 263 mg/dL — ABNORMAL HIGH (ref 65–99)

## 2016-08-01 LAB — CBC WITH DIFFERENTIAL/PLATELET
Basophils Absolute: 0 10*3/uL (ref 0.0–0.1)
Basophils Relative: 0 %
Eosinophils Absolute: 0.1 10*3/uL (ref 0.0–0.7)
Eosinophils Relative: 1 %
HCT: 43.9 % (ref 39.0–52.0)
Hemoglobin: 14.6 g/dL (ref 13.0–17.0)
Lymphocytes Relative: 17 %
Lymphs Abs: 2 10*3/uL (ref 0.7–4.0)
MCH: 31.1 pg (ref 26.0–34.0)
MCHC: 33.3 g/dL (ref 30.0–36.0)
MCV: 93.4 fL (ref 78.0–100.0)
Monocytes Absolute: 1.1 10*3/uL — ABNORMAL HIGH (ref 0.1–1.0)
Monocytes Relative: 9 %
Neutro Abs: 8.5 10*3/uL — ABNORMAL HIGH (ref 1.7–7.7)
Neutrophils Relative %: 73 %
Platelets: 141 10*3/uL — ABNORMAL LOW (ref 150–400)
RBC: 4.7 MIL/uL (ref 4.22–5.81)
RDW: 14.7 % (ref 11.5–15.5)
WBC: 11.7 10*3/uL — ABNORMAL HIGH (ref 4.0–10.5)

## 2016-08-01 LAB — BLOOD CULTURE ID PANEL (REFLEXED)

## 2016-08-01 LAB — LACTIC ACID, PLASMA: Lactic Acid, Venous: 1.5 mmol/L (ref 0.5–1.9)

## 2016-08-01 LAB — SURGICAL PCR SCREEN
MRSA, PCR: NEGATIVE
Staphylococcus aureus: NEGATIVE

## 2016-08-01 SURGERY — TONSILLECTOMY
Anesthesia: General | Site: Throat | Laterality: Right

## 2016-08-01 MED ORDER — PROMETHAZINE HCL 25 MG/ML IJ SOLN
6.2500 mg | INTRAMUSCULAR | Status: DC | PRN
Start: 2016-08-01 — End: 2016-08-01

## 2016-08-01 MED ORDER — ACETAMINOPHEN 10 MG/ML IV SOLN
INTRAVENOUS | Status: AC
  Filled 2016-08-01: qty 100

## 2016-08-01 MED ORDER — 0.9 % SODIUM CHLORIDE (POUR BTL) OPTIME
TOPICAL | Status: DC | PRN
  Administered 2016-08-01: 1000 mL

## 2016-08-01 MED ORDER — LIDOCAINE 2% (20 MG/ML) 5 ML SYRINGE
INTRAMUSCULAR | Status: AC
  Filled 2016-08-01: qty 5

## 2016-08-01 MED ORDER — MORPHINE SULFATE (PF) 4 MG/ML IV SOLN
2.0000 mg | INTRAVENOUS | Status: DC | PRN
  Administered 2016-08-01 (×2): 2 mg via INTRAVENOUS
  Filled 2016-08-01 (×3): qty 1

## 2016-08-01 MED ORDER — CHLORHEXIDINE GLUCONATE 0.12 % MT SOLN
15.0000 mL | Freq: Two times a day (BID) | OROMUCOSAL | Status: DC
  Administered 2016-08-01 – 2016-08-03 (×5): 15 mL via OROMUCOSAL
  Filled 2016-08-01 (×6): qty 15

## 2016-08-01 MED ORDER — ACETAMINOPHEN 650 MG RE SUPP: 650.0000 mg | Freq: Four times a day (QID) | RECTAL | Status: DC | PRN

## 2016-08-01 MED ORDER — PROPOFOL 10 MG/ML IV BOLUS
INTRAVENOUS | Status: DC | PRN
  Administered 2016-08-01: 50 mg via INTRAVENOUS
  Administered 2016-08-01: 150 mg via INTRAVENOUS
  Administered 2016-08-01: 50 mg via INTRAVENOUS

## 2016-08-01 MED ORDER — ACETAMINOPHEN 10 MG/ML IV SOLN
INTRAVENOUS | Status: DC | PRN
  Administered 2016-08-01: 1000 mg via INTRAVENOUS

## 2016-08-01 MED ORDER — HYDRALAZINE HCL 20 MG/ML IJ SOLN: 10.0000 mg | INTRAMUSCULAR | Status: DC | PRN

## 2016-08-01 MED ORDER — ROCURONIUM BROMIDE 50 MG/5ML IV SOSY
PREFILLED_SYRINGE | INTRAVENOUS | Status: AC
  Filled 2016-08-01: qty 5

## 2016-08-01 MED ORDER — CISATRACURIUM BESYLATE 20 MG/10ML IV SOLN
INTRAVENOUS | Status: AC
  Filled 2016-08-01: qty 10

## 2016-08-01 MED ORDER — ONDANSETRON HCL 4 MG/2ML IJ SOLN
INTRAMUSCULAR | Status: AC
  Filled 2016-08-01: qty 2

## 2016-08-01 MED ORDER — MIDAZOLAM HCL 2 MG/2ML IJ SOLN
INTRAMUSCULAR | Status: AC
  Filled 2016-08-01: qty 2

## 2016-08-01 MED ORDER — ONDANSETRON HCL 4 MG/2ML IJ SOLN: 4.0000 mg | Freq: Four times a day (QID) | INTRAMUSCULAR | Status: DC | PRN

## 2016-08-01 MED ORDER — PROPOFOL 10 MG/ML IV BOLUS
INTRAVENOUS | Status: AC
  Filled 2016-08-01: qty 20

## 2016-08-01 MED ORDER — FENTANYL CITRATE (PF) 250 MCG/5ML IJ SOLN
INTRAMUSCULAR | Status: AC
Start: 2016-08-01 — End: 2016-08-01
  Filled 2016-08-01: qty 5

## 2016-08-01 MED ORDER — INSULIN ASPART 100 UNIT/ML ~~LOC~~ SOLN
0.0000 [IU] | SUBCUTANEOUS | Status: DC
  Administered 2016-08-01: 2 [IU] via SUBCUTANEOUS
  Administered 2016-08-01: 5 [IU] via SUBCUTANEOUS
  Administered 2016-08-01: 2 [IU] via SUBCUTANEOUS
  Administered 2016-08-01: 3 [IU] via SUBCUTANEOUS
  Administered 2016-08-01: 2 [IU] via SUBCUTANEOUS
  Administered 2016-08-02 (×3): 5 [IU] via SUBCUTANEOUS
  Administered 2016-08-02: 2 [IU] via SUBCUTANEOUS
  Administered 2016-08-02: 7 [IU] via SUBCUTANEOUS
  Administered 2016-08-02 – 2016-08-03 (×2): 2 [IU] via SUBCUTANEOUS
  Administered 2016-08-03: 3 [IU] via SUBCUTANEOUS
  Administered 2016-08-03: 5 [IU] via SUBCUTANEOUS
  Administered 2016-08-03: 7 [IU] via SUBCUTANEOUS

## 2016-08-01 MED ORDER — OXYCODONE-ACETAMINOPHEN 5-325 MG PO TABS: 1.0000 | ORAL_TABLET | ORAL | Status: DC | PRN

## 2016-08-01 MED ORDER — CLINDAMYCIN PHOSPHATE 600 MG/50ML IV SOLN
600.0000 mg | Freq: Three times a day (TID) | INTRAVENOUS | Status: DC
Start: 2016-08-01 — End: 2016-08-03
  Administered 2016-08-01 – 2016-08-03 (×7): 600 mg via INTRAVENOUS
  Filled 2016-08-01 (×10): qty 50

## 2016-08-01 MED ORDER — ONDANSETRON HCL 4 MG/2ML IJ SOLN
INTRAMUSCULAR | Status: DC | PRN
  Administered 2016-08-01: 4 mg via INTRAVENOUS

## 2016-08-01 MED ORDER — EPHEDRINE SULFATE 50 MG/ML IJ SOLN
INTRAMUSCULAR | Status: DC | PRN
  Administered 2016-08-01 (×2): 10 mg via INTRAVENOUS
  Administered 2016-08-01: 5 mg via INTRAVENOUS

## 2016-08-01 MED ORDER — FENTANYL CITRATE (PF) 100 MCG/2ML IJ SOLN
INTRAMUSCULAR | Status: DC | PRN
  Administered 2016-08-01: 50 ug via INTRAVENOUS

## 2016-08-01 MED ORDER — ORAL CARE MOUTH RINSE
15.0000 mL | Freq: Two times a day (BID) | OROMUCOSAL | Status: DC
  Administered 2016-08-01 – 2016-08-03 (×4): 15 mL via OROMUCOSAL

## 2016-08-01 MED ORDER — SODIUM CHLORIDE 0.9 % IV SOLN
INTRAVENOUS | Status: AC
  Administered 2016-08-01 (×2): via INTRAVENOUS

## 2016-08-01 MED ORDER — DEXAMETHASONE SODIUM PHOSPHATE 10 MG/ML IJ SOLN
INTRAMUSCULAR | Status: AC
  Filled 2016-08-01: qty 1

## 2016-08-01 MED ORDER — DEXAMETHASONE SODIUM PHOSPHATE 10 MG/ML IJ SOLN
INTRAMUSCULAR | Status: DC | PRN
  Administered 2016-08-01: 5 mg via INTRAVENOUS

## 2016-08-01 MED ORDER — SUCCINYLCHOLINE CHLORIDE 200 MG/10ML IV SOSY
PREFILLED_SYRINGE | INTRAVENOUS | Status: DC | PRN
  Administered 2016-08-01: 20 mg via INTRAVENOUS
  Administered 2016-08-01: 120 mg via INTRAVENOUS

## 2016-08-01 MED ORDER — SUCCINYLCHOLINE CHLORIDE 200 MG/10ML IV SOSY
PREFILLED_SYRINGE | INTRAVENOUS | Status: AC
  Filled 2016-08-01: qty 10

## 2016-08-01 MED ORDER — LIDOCAINE 2% (20 MG/ML) 5 ML SYRINGE
INTRAMUSCULAR | Status: DC | PRN
  Administered 2016-08-01: 100 mg via INTRAVENOUS

## 2016-08-01 MED ORDER — ONDANSETRON HCL 4 MG PO TABS: 4.0000 mg | ORAL_TABLET | Freq: Four times a day (QID) | ORAL | Status: DC | PRN

## 2016-08-01 MED ORDER — MORPHINE SULFATE (PF) 4 MG/ML IV SOLN: 1.0000 mg | INTRAVENOUS | Status: DC | PRN

## 2016-08-01 MED ORDER — ACETAMINOPHEN 325 MG PO TABS
650.0000 mg | ORAL_TABLET | Freq: Four times a day (QID) | ORAL | Status: DC | PRN
  Administered 2016-08-03: 650 mg via ORAL
  Filled 2016-08-01: qty 2

## 2016-08-01 SURGICAL SUPPLY — 18 items
CATH ROBINSON RED A/P 10FR (CATHETERS) ×2 IMPLANT
CLEANER TIP ELECTROSURG 2X2 (MISCELLANEOUS) ×2 IMPLANT
COVER SURGICAL LIGHT HANDLE (MISCELLANEOUS) ×2 IMPLANT
ELECT COATED BLADE 2.86 ST (ELECTRODE) ×2 IMPLANT
ELECT REM PT RETURN 9FT ADLT (ELECTROSURGICAL) ×2
ELECTRODE REM PT RTRN 9FT ADLT (ELECTROSURGICAL) ×1 IMPLANT
GAUZE SPONGE 4X4 16PLY XRAY LF (GAUZE/BANDAGES/DRESSINGS) ×2 IMPLANT
GLOVE ECLIPSE 8.0 STRL XLNG CF (GLOVE) ×2 IMPLANT
KIT BASIN OR (CUSTOM PROCEDURE TRAY) ×2 IMPLANT
NEEDLE SPNL 22GX3.5 QUINCKE BK (NEEDLE) ×2 IMPLANT
PACK BASIC VI WITH GOWN DISP (CUSTOM PROCEDURE TRAY) ×2 IMPLANT
PENCIL FOOT CONTROL (ELECTRODE) ×2 IMPLANT
SPONGE TONSIL 1 RF SGL (DISPOSABLE) ×4 IMPLANT
SWAB COLLECTION DEVICE MRSA (MISCELLANEOUS) ×4 IMPLANT
SWAB CULTURE ESWAB REG 1ML (MISCELLANEOUS) ×4 IMPLANT
SYR BULB 3OZ (MISCELLANEOUS) ×2 IMPLANT
WATER STERILE IRR 1500ML POUR (IV SOLUTION) ×2 IMPLANT
YANKAUER SUCT BULB TIP 10FT TU (MISCELLANEOUS) ×2 IMPLANT

## 2016-08-01 NOTE — ED Notes (Signed)
Pt's arrival on floor delayed d/t code on 4th floor.

## 2016-08-01 NOTE — H&P (Signed)
History and Physical    OLUWADEMILADE Medina ASN:[id number] DOB: 24-Mar-1951 DOA: 07/31/2016  PCP: Thressa Sheller, MD  Patient coming from: Home.  Chief Complaint: Throat pain.  HPI: Nathan Medina is a 66 y.o. male with history of diabetes mellitus type 2, hypertension, chronic kidney disease presents to the ER because of worsening pain in the throat. Patient's symptoms started 3-4 days ago when patient had gone to urgent care center and was prescribed amoxicillin. Despite taking which patient's symptoms worsen. Patient found it difficult to swallow. Patient went back to the urgent care again yesterday and was instructed to come to the ER due to concerns for tonsillar abscess. Patient has found it difficult to swallow and speak. No restrictions in breathing.  ED Course: In the ER patient was found to be febrile tachycardic. Lactate level was mildly elevated. CT scan of the neck done without contrast due to renal failure shows right tonsillar abscess. Dr. Delia Chimes, on-call ENT surgeon was consulted. Patient was started on clindamycin.  Review of Systems: As per HPI, rest all negative.   Past Medical History:  Diagnosis Date  . Abnormal liver function test 05/23/2011  . ALLERGIC RHINITIS   . ANXIETY   . Arthritis   . Chronic kidney disease    CKD stage 2 per office visit note 08/01/12 on chart   . COLONIC POLYPS, HX OF   . Complication of anesthesia    slow to wake up   . DEPRESSION   . DIABETES MELLITUS, TYPE II   . GERD   . GOUT   . HYPERLIPIDEMIA   . HYPERTENSION   . IBS   . 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 Left renal mass 05/23/2011  . Lumbar radicular pain 06/03/2011  . Shortness of breath    occasional shortness of breath     Past Surgical History:  Procedure Laterality Date  . APPENDECTOMY  02/12/11  . COLON SURGERY  08/11/10   sigmoid colectomy  . COLON SURGERY  02/12/11   colostomy takedown  . HERNIA REPAIR    . INSERTION OF MESH N/A 08/20/2012   Procedure: INSERTION OF MESH;  Surgeon: [name], MD;  Location: [facility];  Service: General;  Laterality: N/A;  . LYSIS OF ADHESION  08/20/2012   Procedure: LYSIS OF ADHESION;  Surgeon: [name], MD;  Location: [facility];  Service: General;;  . SEPTOPLASTY    . VENTRAL HERNIA REPAIR  08/20/2012   Procedure: HERNIA REPAIR VENTRAL ADULT;  Surgeon: [name], MD;  Location: [facility];  Service: General;;     reports that he has never smoked. He has never used smokeless tobacco. He reports that he drinks alcohol. He reports that he does not use drugs.  Allergies  Allergen Reactions  . Amoxicillin Other (See Comments)    Reaction:  Hiccups  Has patient had a PCN reaction causing immediate rash, facial/tongue/throat swelling, SOB or lightheadedness with hypotension: No Has patient had a PCN reaction causing severe rash involving mucus membranes or skin necrosis: No Has patient had a PCN reaction that required hospitalization No Has patient had a PCN reaction occurring within the last 10 years: No If all of the above answers are "NO", then may proceed with Cephalosporin use.  . Escitalopram Oxalate Other (See Comments)    Reaction:  Unknown   . Ciprofloxacin Rash  . Quinapril Hcl Rash  . Sulfa Antibiotics  Itching    Family History  Problem Relation Age of Onset  . Hypertension Paternal Aunt   . Stroke Maternal Grandmother   . Hypertension Maternal Grandmother     Prior to Admission medications   Medication Sig Start Date End Date Taking? Authorizing Provider  acetaminophen (TYLENOL) 500 MG tablet Take 1,000 mg by mouth every 6 (six) hours as needed for mild pain, moderate pain or headache.    Yes Historical Provider, MD  allopurinol (ZYLOPRIM) 300 MG tablet TAKE 1 TABLET BY MOUTH EVERY DAY 05/16/13  Yes Biagio Borg, MD  ALPRAZolam Duanne Moron) 0.5 MG tablet Take 1 tablet (0.5 mg total) by mouth daily as  needed for sleep. 1/2 - 1 by mouth once daily as needed Patient taking differently: Take 0.25-0.5 mg by mouth at bedtime as needed for sleep.  04/03/12  Yes Biagio Borg, MD  aspirin EC 81 MG tablet Take 81 mg by mouth every other day.   Yes Historical Provider, MD  carvedilol (COREG) 6.25 MG tablet Take 6.25 mg by mouth 2 (two) times daily with a meal.   Yes Historical Provider, MD  cyclobenzaprine (FLEXERIL) 10 MG tablet Take 1 tablet (10 mg total) by mouth 3 (three) times daily as needed for muscle spasms. 08/25/12  Yes Autumn Messing III, MD  diphenhydrAMINE (BENADRYL) 25 MG tablet Take 25 mg by mouth at bedtime as needed for sleep.   Yes Historical Provider, MD  empagliflozin (JARDIANCE) 25 MG TABS tablet Take 25 mg by mouth daily.   Yes Historical Provider, MD  furosemide (LASIX) 40 MG tablet Take 40 mg by mouth daily.   Yes Historical Provider, MD  guaiFENesin (MUCINEX) 600 MG 12 hr tablet Take 600 mg by mouth 2 (two) times daily as needed for cough or to loosen phlegm.   Yes Historical Provider, MD  insulin glargine (LANTUS) 100 unit/mL SOPN Inject 12 Units into the skin at bedtime.   Yes Historical Provider, MD  metformin (FORTAMET) 1000 MG (OSM) 24 hr tablet Take 1,000 mg by mouth 2 (two) times daily with a meal.   Yes Historical Provider, MD  MICARDIS 80 MG tablet TAKE 1 TABLET BY MOUTH EVERY DAY Patient taking differently: TAKE 1 TABLET BY MOUTH AT BEDTIME 04/29/12  Yes Biagio Borg, MD  Multiple Vitamin (MULTIVITAMIN WITH MINERALS) TABS tablet Take 1 tablet by mouth daily.   Yes Historical Provider, MD  omeprazole (PRILOSEC) 20 MG capsule Take 20 mg by mouth daily.   Yes Historical Provider, MD  potassium chloride SA (K-DUR,KLOR-CON) 20 MEQ tablet Take 20 mEq by mouth daily.   Yes Historical Provider, MD  spironolactone (ALDACTONE) 25 MG tablet Take 25 mg by mouth daily.    Yes Historical Provider, MD  tadalafil (CIALIS) 20 MG tablet Take 20 mg by mouth daily as needed for erectile  dysfunction.    Yes Historical Provider, MD  traMADol (ULTRAM) 50 MG tablet Take 1 tablet (50 mg total) by mouth every 6 (six) hours as needed for pain. Maximum dose= 8 tablets per day 04/03/12  Yes Biagio Borg, MD  vitamin E 400 UNIT capsule Take 400 Units by mouth daily.   Yes Historical Provider, MD    Physical Exam: Vitals:   08/01/16 0045 08/01/16 0100 08/01/16 0130 08/01/16 0210  BP:  135/87 131/90 115/84  Pulse: 83 84 82 74  Resp: 22 20 19 12   Temp:    97.7 F (36.5 C)  TempSrc:    Oral  SpO2: 97% 97%  97% 97%  Weight:    99.8 kg (220 lb 0.3 oz)  Height:    5\' 8"  (1.727 m)      Constitutional: Moderately built and nourished. Vitals:   08/01/16 0045 08/01/16 0100 08/01/16 0130 08/01/16 0210  BP:  135/87 131/90 115/84  Pulse: 83 84 82 74  Resp: 22 20 19 12   Temp:    97.7 F (36.5 C)  TempSrc:    Oral  SpO2: 97% 97% 97% 97%  Weight:    99.8 kg (220 lb 0.3 oz)  Height:    5\' 8"  (1.727 m)   Eyes: Anicteric no pallor. ENMT: Right tonsillar swelling. Neck: No neck rigidity. Tenderness in the right jugulodigastric area. Respiratory: No rhonchi or crepitations. Cardiovascular: S1 and S2 heard no murmurs appreciated. Abdomen: Soft nontender bowel sounds present. Musculoskeletal: No edema. No joint effusion. Skin: No rash. Skin appears warm. Neurologic: Alert awake oriented to time place and person. Moves all extremities. Psychiatric: Appears normal. Normal affect.   Labs on Admission: I have personally reviewed following labs and imaging studies  CBC:  Recent Labs Lab 07/31/16 1641  WBC 13.0*  NEUTROABS 10.1*  HGB 16.4  HCT 47.5  MCV 91.9  PLT 754   Basic Metabolic Panel:  Recent Labs Lab 07/31/16 1936  NA 136  K 5.1  CL 105  CO2 20*  GLUCOSE 253*  BUN 56*  CREATININE 2.36*  CALCIUM 9.2   GFR: Estimated Creatinine Clearance: 35.3 mL/min (by C-G formula based on SCr of 2.36 mg/dL (H)). Liver Function Tests:  Recent Labs Lab 07/31/16 1936    AST 25  ALT 33  ALKPHOS 108  BILITOT 1.4*  PROT 6.7  ALBUMIN 3.1*   No results for input(s): LIPASE, AMYLASE in the last 168 hours. No results for input(s): AMMONIA in the last 168 hours. Coagulation Profile: No results for input(s): INR, PROTIME in the last 168 hours. Cardiac Enzymes: No results for input(s): CKTOTAL, CKMB, CKMBINDEX, TROPONINI in the last 168 hours. BNP (last 3 results) No results for input(s): PROBNP in the last 8760 hours. HbA1C: No results for input(s): HGBA1C in the last 72 hours. CBG:  Recent Labs Lab 07/31/16 1620 07/31/16 2010  GLUCAP 296* 234*   Lipid Profile: No results for input(s): CHOL, HDL, LDLCALC, TRIG, CHOLHDL, LDLDIRECT in the last 72 hours. Thyroid Function Tests: No results for input(s): TSH, T4TOTAL, FREET4, T3FREE, THYROIDAB in the last 72 hours. Anemia Panel: No results for input(s): VITAMINB12, FOLATE, FERRITIN, TIBC, IRON, RETICCTPCT in the last 72 hours. Urine analysis:    Component Value Date/Time   COLORURINE LT. YELLOW 04/01/2012 0822   APPEARANCEUR CLEAR 04/01/2012 0822   LABSPEC 1.010 04/01/2012 0822   PHURINE 5.5 04/01/2012 0822   GLUCOSEU NEGATIVE 04/01/2012 0822   HGBUR NEGATIVE 04/01/2012 0822   BILIRUBINUR NEGATIVE 04/01/2012 0822   KETONESUR NEGATIVE 04/01/2012 0822   UROBILINOGEN 0.2 04/01/2012 0822   NITRITE NEGATIVE 04/01/2012 0822   LEUKOCYTESUR SMALL 04/01/2012 0822   Sepsis Labs: @LABRCNTIP (procalcitonin:4,lacticidven:4) ) Recent Results (from the past 240 hour(s))  Blood culture (routine x 2)     Status: None (Preliminary result)   Collection Time: 07/31/16  4:36 PM  Result Value Ref Range Status   Specimen Description RIGHT ANTECUBITAL  Final   Special Requests BOTTLES DRAWN AEROBIC AND ANAEROBIC 5CC  Final   Culture PENDING  Incomplete   Report Status PENDING  Incomplete     Radiological Exams on Admission: Ct Soft Tissue Neck Wo Contrast  Result Date:  07/31/2016 CLINICAL DATA:  66 y/o  M;  sore throat and peritonsillar abscess. EXAM: CT NECK WITHOUT CONTRAST TECHNIQUE: Multidetector CT imaging of the neck was performed following the standard protocol without intravenous contrast. COMPARISON:  None. FINDINGS: Pharynx and larynx: Marked swelling of the right palatine tonsil and right greater than left or pharyngeal mucosal thickening. There are foci of hypoattenuation within the right palatine tonsil. Salivary glands: No inflammation, mass, or stone. Thyroid: Multiple thyroid nodules the largest on the left with coarse calcification measuring up to 25 mm. Lymph nodes: None enlarged or abnormal density. Vascular: Mild calcific atherosclerosis of the aortic arch and left carotid bifurcation. Limited intracranial: Negative. Visualized orbits: Negative. Mastoids and visualized paranasal sinuses: Clear. Skeleton: Moderate cervical spondylosis with discogenic degenerative changes greatest from the C4 through C7 levels where there is disc space narrowing and osteophytes. No high-grade bony canal stenosis or foraminal narrowing. Upper chest: 12 mm ground-glass nodule in the right lung apex (series 3, image 102). Other: None. IMPRESSION: 1. Marked swelling of the right palatine tonsil with foci of hypoattenuation probably representing abscess/phlegmonous change. Delineation of abscess is suboptimal in the absence of intravenous contrast. No extension into deep cervical compartments or retropharyngeal fluid collection. 2. Multiple thyroid nodules measuring up to 25 mm in the left lobe of thyroid. Further evaluation with thyroid ultrasound is recommended on a nonemergent basis. 3. 12 mm ground-glass nodule in the right lung apex. Initial follow-up with CT at 6-12 months is recommended to confirm persistence. If persistent, repeat CT is recommended every 2 years until 5 years of stability has been established. This recommendation follows the consensus statement: Guidelines for Management of Incidental Pulmonary  Nodules Detected on CT Images: From the Fleischner Society 2017; Radiology 2017; 284:228-243. 4. Moderate cervical spondylosis. Electronically Signed   By: Kristine Garbe M.D.   On: 07/31/2016 21:34     Assessment/Plan Principal Problem:   Peritonsillar abscess Active Problems:   Type 2 diabetes mellitus with renal manifestations (HCC)   Essential hypertension   Renal failure (ARF), acute on chronic ([place])    1. Peritonsillar abscess - patient has been placed on clindamycin IV. Will keep patient nothing by mouth in anticipation of possible procedure. ENT surgeon Dr. Delia Chimes will be seeing patient in consult. 2. Diabetes mellitus type 2 with hyperglycemia - since patient is nothing by mouth I have placed patient on sliding scale coverage. Closely follow metabolic panel and CBGs. 3. Hypertension - since patient had low normal blood pressure I will place patient on when necessary IV hydralazine for systolic blood pressure more than 160 and hold antihypertensives for now. 4. Chronic kidney disease stage III - follow metabolic panel.   DVT prophylaxis: SCDs in anticipation of possible surgery. Code Status: Full code.  Family Communication: Discussed with patient.  Disposition Plan: Home.  Consults called: ENT.  Admission status: Inpatient.    Rise Patience MD [facility] Pager [phone].  If 7PM-7AM, please contact night-coverage [email or web address] Password [facility]  08/01/2016, 2:43 AM

## 2016-08-01 NOTE — Care Management Note (Signed)
Case Management Note  Patient Details  Name: JAVONNIE ILLESCAS MRN: [id number] Date of Birth: May 22, 1950  Subjective/Objective: 66 y.o. M admitted from home where he lives alone. Has used [facility] in the past and will use them again if needed.                    Action/Plan:CM will sign off for now but will be available should additional discharge needs arise or disposition change.    Expected Discharge Date:   (unknown)               Expected Discharge Plan:     In-House Referral:     Discharge planning Services  CM Consult  Post Acute Care Choice:    Choice offered to:  Patient  DME Arranged:    DME Agency:     HH Arranged:    [place] Agency:     Status of Service:  In process, will continue to follow  If discussed at Long Length of Stay Meetings, dates discussed:    Additional Comments:  Delrae Sawyers, RN 08/01/2016, 1:36 PM

## 2016-08-01 NOTE — Discharge Instructions (Signed)
Stay hydrated. Advance to solids when comfortable Complete antibiotics. Recheck my office 2 weeks please. Call for bleeding, excessive pain.   Routine activities

## 2016-08-01 NOTE — Anesthesia Procedure Notes (Signed)
Procedure Name: Intubation Date/Time: 08/01/2016 1:46 PM Performed by: Maxwell Caul Pre-anesthesia Checklist: Patient identified, Emergency Drugs available, Suction available and Patient being monitored Patient Re-evaluated:Patient Re-evaluated prior to inductionOxygen Delivery Method: Circle system utilized Preoxygenation: Pre-oxygenation with 100% oxygen Intubation Type: IV induction Ventilation: Mask ventilation without difficulty Laryngoscope Size: Glidescope and 4 Grade View: Grade I Tube type: Oral Tube size: 7.5 mm Number of attempts: 1 Airway Equipment and Method: Stylet Placement Confirmation: ETT inserted through vocal cords under direct vision,  positive ETCO2 and breath sounds checked- equal and bilateral Secured at: 22 cm Tube secured with: Tape Dental Injury: Teeth and Oropharynx as per pre-operative assessment

## 2016-08-01 NOTE — Consult Note (Signed)
Nathan Medina, Nathan Medina 66 y.o., male [id number]     Chief Complaint:  Sore throat  HPI: 66 yo wm with DM, CRF, HTN onset sore throat 4 days ago.  Given Amoxicillin at Urgent care.  Progressive worsening, localized to RIGHT with referred otalgia, neck pain, pain with swallowing, sl trismus.  No breathing difficulty.  Claims to have chronic tonsil problems related to post nasal drainage.  Also chronic sinus/allergy problems.  CT last PM in [place] without contrast showed RIGHT peritonsillar swelling.  Frontal sinuses were not imaged, but all remaining sinuses completely clear.  Bulky inferior turbinates.  Non documented fever.  No prior hx peritonsillar abscess.    DM control less than good recently perhaps related to poor po intake.    PMH: Past Medical History:  Diagnosis Date  . Abnormal liver function test 05/23/2011  . ALLERGIC RHINITIS   . ANXIETY   . Arthritis   . Chronic kidney disease    CKD stage 2 per office visit note 08/01/12 on chart   . COLONIC POLYPS, HX OF   . Complication of anesthesia    slow to wake up   . DEPRESSION   . DIABETES MELLITUS, TYPE II   . GERD   . GOUT   . HYPERLIPIDEMIA   . HYPERTENSION   . IBS   . 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 Left renal mass 05/23/2011  . Lumbar radicular pain 06/03/2011  . Shortness of breath    occasional shortness of breath     Surg Hx: Past Surgical History:  Procedure Laterality Date  . APPENDECTOMY  02/12/11  . COLON SURGERY  08/11/10   sigmoid colectomy  . COLON SURGERY  02/12/11   colostomy takedown  . HERNIA REPAIR    . INSERTION OF MESH N/A 08/20/2012   Procedure: INSERTION OF MESH;  Surgeon: [name], MD;  Location: [facility];  Service: General;  Laterality: N/A;  . LYSIS OF ADHESION  08/20/2012   Procedure: LYSIS OF ADHESION;  Surgeon: [name], MD;  Location: [facility];  Service: General;;  . SEPTOPLASTY    . VENTRAL HERNIA  REPAIR  08/20/2012   Procedure: HERNIA REPAIR VENTRAL ADULT;  Surgeon: [name], MD;  Location: [facility];  Service: General;;    FHx:   Family History  Problem Relation Age of Onset  . Hypertension Paternal Aunt   . Stroke Maternal Grandmother   . Hypertension Maternal Grandmother    SocHx:  reports that he has never smoked. He has never used smokeless tobacco. He reports that he drinks alcohol. He reports that he does not use drugs.  ALLERGIES:  Allergies  Allergen Reactions  . Amoxicillin Other (See Comments)    Reaction:  Hiccups  Has patient had a PCN reaction causing immediate rash, facial/tongue/throat swelling, SOB or lightheadedness with hypotension: No Has patient had a PCN reaction causing severe rash involving mucus membranes or skin necrosis: No Has patient had a PCN reaction that required hospitalization No Has patient had a PCN reaction occurring within the last 10 years: No If all of the above answers are "NO", then may proceed with Cephalosporin use.  . Escitalopram Oxalate Other (See Comments)    Reaction:  Unknown   . Ciprofloxacin Rash  . Quinapril Hcl Rash  . Sulfa Antibiotics Itching    Medications Prior to Admission  Medication Sig Dispense Refill  . acetaminophen (TYLENOL)  500 MG tablet Take 1,000 mg by mouth every 6 (six) hours as needed for mild pain, moderate pain or headache.     . allopurinol (ZYLOPRIM) 300 MG tablet TAKE 1 TABLET BY MOUTH EVERY DAY 90 tablet 0  . ALPRAZolam (XANAX) 0.5 MG tablet Take 1 tablet (0.5 mg total) by mouth daily as needed for sleep. 1/2 - 1 by mouth once daily as needed (Patient taking differently: Take 0.25-0.5 mg by mouth at bedtime as needed for sleep. ) 90 tablet 2  . aspirin EC 81 MG tablet Take 81 mg by mouth every other day.    . carvedilol (COREG) 6.25 MG tablet Take 6.25 mg by mouth 2 (two) times daily with a meal.    . cyclobenzaprine (FLEXERIL) 10 MG tablet Take 1 tablet (10 mg total) by mouth 3 (three) times  daily as needed for muscle spasms. 30 tablet 2  . diphenhydrAMINE (BENADRYL) 25 MG tablet Take 25 mg by mouth at bedtime as needed for sleep.    . empagliflozin (JARDIANCE) 25 MG TABS tablet Take 25 mg by mouth daily.    . furosemide (LASIX) 40 MG tablet Take 40 mg by mouth daily.    [name] guaiFENesin (MUCINEX) 600 MG 12 hr tablet Take 600 mg by mouth 2 (two) times daily as needed for cough or to loosen phlegm.    . insulin glargine (LANTUS) 100 unit/mL SOPN Inject 12 Units into the skin at bedtime.    . metformin (FORTAMET) 1000 MG (OSM) 24 hr tablet Take 1,000 mg by mouth 2 (two) times daily with a meal.    . MICARDIS 80 MG tablet TAKE 1 TABLET BY MOUTH EVERY DAY (Patient taking differently: TAKE 1 TABLET BY MOUTH AT BEDTIME) 90 tablet 3  . Multiple Vitamin (MULTIVITAMIN WITH MINERALS) TABS tablet Take 1 tablet by mouth daily.    [name] omeprazole (PRILOSEC) 20 MG capsule Take 20 mg by mouth daily.    . potassium chloride SA (K-DUR,KLOR-CON) 20 MEQ tablet Take 20 mEq by mouth daily.    [name] spironolactone (ALDACTONE) 25 MG tablet Take 25 mg by mouth daily.     . tadalafil (CIALIS) 20 MG tablet Take 20 mg by mouth daily as needed for erectile dysfunction.     . traMADol (ULTRAM) 50 MG tablet Take 1 tablet (50 mg total) by mouth every 6 (six) hours as needed for pain. Maximum dose= 8 tablets per day 120 tablet 1  . vitamin E 400 UNIT capsule Take 400 Units by mouth daily.      Results for orders placed or performed during the hospital encounter of 07/31/16 (from the past 48 hour(s))  CBG monitoring, ED     Status: Abnormal   Collection Time: 07/31/16  4:20 PM  Result Value Ref Range   Glucose-Capillary 296 (H) 65 - 99 mg/dL  Blood culture (routine x 2)     Status: None (Preliminary result)   Collection Time: 07/31/16  4:36 PM  Result Value Ref Range   Specimen Description RIGHT ANTECUBITAL    Special Requests BOTTLES DRAWN AEROBIC AND ANAEROBIC 5CC    Culture PENDING    Report Status PENDING   CBC  with Differential/Platelet     Status: Abnormal   Collection Time: 07/31/16  4:41 PM  Result Value Ref Range   WBC 13.0 (H) 4.0 - 10.5 K/uL   RBC 5.17 4.22 - 5.81 MIL/uL   Hemoglobin 16.4 13.0 - 17.0 g/dL   HCT 47.5 39.0 - 52.0 %  MCV 91.9 78.0 - 100.0 fL   MCH 31.7 26.0 - 34.0 pg   MCHC 34.5 30.0 - 36.0 g/dL   RDW 14.8 11.5 - 15.5 %   Platelets 197 150 - 400 K/uL    Comment: SPECIMEN CHECKED FOR CLOTS REPEATED TO VERIFY PLATELET COUNT CONFIRMED BY SMEAR    Neutrophils Relative % 78 %   Lymphocytes Relative 13 %   Monocytes Relative 9 %   Eosinophils Relative 0 %   Basophils Relative 0 %   Neutro Abs 10.1 (H) 1.7 - 7.7 K/uL   Lymphs Abs 1.7 0.7 - 4.0 K/uL   Monocytes Absolute 1.2 (H) 0.1 - 1.0 K/uL   Eosinophils Absolute 0.0 0.0 - 0.7 K/uL   Basophils Absolute 0.0 0.0 - 0.1 K/uL   Smear Review MORPHOLOGY UNREMARKABLE   I-Stat CG4 Lactic Acid, ED     Status: Abnormal   Collection Time: 07/31/16  4:57 PM  Result Value Ref Range   Lactic Acid, Venous 1.99 (HH) 0.5 - 1.9 mmol/L  Comprehensive metabolic panel     Status: Abnormal   Collection Time: 07/31/16  7:36 PM  Result Value Ref Range   Sodium 136 135 - 145 mmol/L   Potassium 5.1 3.5 - 5.1 mmol/L   Chloride 105 101 - 111 mmol/L   CO2 20 (L) 22 - 32 mmol/L   Glucose, Bld 253 (H) 65 - 99 mg/dL   BUN 56 (H) 6 - 20 mg/dL   Creatinine, Ser 2.36 (H) 0.61 - 1.24 mg/dL   Calcium 9.2 8.9 - 10.3 mg/dL   Total Protein 6.7 6.5 - 8.1 g/dL   Albumin 3.1 (L) 3.5 - 5.0 g/dL   AST 25 15 - 41 U/L   ALT 33 17 - 63 U/L   Alkaline Phosphatase 108 38 - 126 U/L   Total Bilirubin 1.4 (H) 0.3 - 1.2 mg/dL   GFR calc non Af Amer 27 (L) >60 mL/min   GFR calc Af Amer 31 (L) >60 mL/min    Comment: (NOTE) The eGFR has been calculated using the CKD EPI equation. This calculation has not been validated in all clinical situations. eGFR's persistently <60 mL/min signify possible Chronic Kidney Disease.    Anion gap 11 5 - 15  I-Stat CG4 Lactic  Acid, ED  (not at  [facility]     Status: Abnormal   Collection Time: 07/31/16  7:46 PM  Result Value Ref Range   Lactic Acid, Venous 2.13 (HH) 0.5 - 1.9 mmol/L   Comment NOTIFIED PHYSICIAN   CBG monitoring, ED     Status: Abnormal   Collection Time: 07/31/16  8:10 PM  Result Value Ref Range   Glucose-Capillary 234 (H) 65 - 99 mg/dL  Glucose, capillary     Status: Abnormal   Collection Time: 08/01/16  2:51 AM  Result Value Ref Range   Glucose-Capillary 225 (H) 65 - 99 mg/dL  Lactic acid, plasma     Status: None   Collection Time: 08/01/16  3:06 AM  Result Value Ref Range   Lactic Acid, Venous 1.5 0.5 - 1.9 mmol/L  Comprehensive metabolic panel     Status: Abnormal   Collection Time: 08/01/16  3:06 AM  Result Value Ref Range   Sodium 139 135 - 145 mmol/L   Potassium 4.6 3.5 - 5.1 mmol/L   Chloride 109 101 - 111 mmol/L   CO2 21 (L) 22 - 32 mmol/L   Glucose, Bld 224 (H) 65 - 99 mg/dL   BUN 55 (  H) 6 - 20 mg/dL   Creatinine, Ser 2.22 (H) 0.61 - 1.24 mg/dL   Calcium 8.9 8.9 - 10.3 mg/dL   Total Protein 6.4 (L) 6.5 - 8.1 g/dL   Albumin 2.8 (L) 3.5 - 5.0 g/dL   AST 21 15 - 41 U/L   ALT 31 17 - 63 U/L   Alkaline Phosphatase 109 38 - 126 U/L   Total Bilirubin 1.1 0.3 - 1.2 mg/dL   GFR calc non Af Amer 29 (L) >60 mL/min   GFR calc Af Amer 34 (L) >60 mL/min    Comment: (NOTE) The eGFR has been calculated using the CKD EPI equation. This calculation has not been validated in all clinical situations. eGFR's persistently <60 mL/min signify possible Chronic Kidney Disease.    Anion gap 9 5 - 15  CBC with Differential/Platelet     Status: Abnormal   Collection Time: 08/01/16  3:06 AM  Result Value Ref Range   WBC 11.7 (H) 4.0 - 10.5 K/uL   RBC 4.70 4.22 - 5.81 MIL/uL   Hemoglobin 14.6 13.0 - 17.0 g/dL   HCT 43.9 39.0 - 52.0 %   MCV 93.4 78.0 - 100.0 fL   MCH 31.1 26.0 - 34.0 pg   MCHC 33.3 30.0 - 36.0 g/dL   RDW 14.7 11.5 - 15.5 %   Platelets 141 (L) 150 - 400 K/uL   Neutrophils  Relative % 73 %   Neutro Abs 8.5 (H) 1.7 - 7.7 K/uL   Lymphocytes Relative 17 %   Lymphs Abs 2.0 0.7 - 4.0 K/uL   Monocytes Relative 9 %   Monocytes Absolute 1.1 (H) 0.1 - 1.0 K/uL   Eosinophils Relative 1 %   Eosinophils Absolute 0.1 0.0 - 0.7 K/uL   Basophils Relative 0 %   Basophils Absolute 0.0 0.0 - 0.1 K/uL  Glucose, capillary     Status: Abnormal   Collection Time: 08/01/16  4:45 AM  Result Value Ref Range   Glucose-Capillary 195 (H) 65 - 99 mg/dL  Glucose, capillary     Status: Abnormal   Collection Time: 08/01/16  8:09 AM  Result Value Ref Range   Glucose-Capillary 193 (H) 65 - 99 mg/dL   Ct Soft Tissue Neck Wo Contrast  Result Date: 07/31/2016 CLINICAL DATA:  66 y/o  M; sore throat and peritonsillar abscess. EXAM: CT NECK WITHOUT CONTRAST TECHNIQUE: Multidetector CT imaging of the neck was performed following the standard protocol without intravenous contrast. COMPARISON:  None. FINDINGS: Pharynx and larynx: Marked swelling of the right palatine tonsil and right greater than left or pharyngeal mucosal thickening. There are foci of hypoattenuation within the right palatine tonsil. Salivary glands: No inflammation, mass, or stone. Thyroid: Multiple thyroid nodules the largest on the left with coarse calcification measuring up to 25 mm. Lymph nodes: None enlarged or abnormal density. Vascular: Mild calcific atherosclerosis of the aortic arch and left carotid bifurcation. Limited intracranial: Negative. Visualized orbits: Negative. Mastoids and visualized paranasal sinuses: Clear. Skeleton: Moderate cervical spondylosis with discogenic degenerative changes greatest from the C4 through C7 levels where there is disc space narrowing and osteophytes. No high-grade bony canal stenosis or foraminal narrowing. Upper chest: 12 mm ground-glass nodule in the right lung apex (series 3, image 102). Other: None. IMPRESSION: 1. Marked swelling of the right palatine tonsil with foci of hypoattenuation  probably representing abscess/phlegmonous change. Delineation of abscess is suboptimal in the absence of intravenous contrast. No extension into deep cervical compartments or retropharyngeal fluid collection. 2. Multiple  thyroid nodules measuring up to 25 mm in the left lobe of thyroid. Further evaluation with thyroid ultrasound is recommended on a nonemergent basis. 3. 12 mm ground-glass nodule in the right lung apex. Initial follow-up with CT at 6-12 months is recommended to confirm persistence. If persistent, repeat CT is recommended every 2 years until 5 years of stability has been established. This recommendation follows the consensus statement: Guidelines for Management of Incidental Pulmonary Nodules Detected on CT Images: From the Fleischner Society 2017; Radiology 2017; 284:228-243. 4. Moderate cervical spondylosis. Electronically Signed   By: Kristine Garbe M.D.   On: 07/31/2016 21:34    ROS:  No chest pain or SOB.    Blood pressure 112/71, pulse 64, temperature 98.4 F (36.9 C), temperature source Oral, resp. rate 20, height _0  (1.727 m), weight 99.8 kg (220 lb 0.3 oz), SpO2 100 %.  PHYSICAL EXAM: Overall appearance:  Distressed.  Some "hot potato" voice.  No fetor oris.   Head:  NCAT Ears: not examined Nose:  Not examined Oral Cavity:  dry Oral Pharynx/Hypopharynx/Larynx:  Swollen RIGHT soft palate with leftward uvular deviation. Neuro:  Grossly intact. Neck:  Tender RIGHT JDG area without discrete nodes.    Studies Reviewed:  CT neck without contrast, 13 MAR    Assessment/Plan   RIGHT peritonsillar abscess.  DM, CRF, HTN.  For I&D under anesthesia later today.  Discussed with pt.  Questions were answered and informed consent obtained.   [name] 9/53/9672, 10:15 AM

## 2016-08-01 NOTE — Care Management Obs Status (Signed)
[facility] NOTIFICATION   Patient Details  Name: KHALIL SZCZEPANIK MRN: [id number] Date of Birth: Oct 07, 1950   Medicare Observation Status Notification Given:  Yes    CrutchfieldAntony Haste, RN 08/01/2016, 1:34 PM

## 2016-08-01 NOTE — Transfer of Care (Signed)
Immediate Anesthesia Transfer of Care Note  Patient: Nathan Medina  Procedure(s) Performed: Procedure(s): INCISION AND DRAINAGE RIGHT PERI-TONSILLAR ABSCESS (Right)  Patient Location: PACU  Anesthesia Type:General  Level of Consciousness:  sedated, patient cooperative and responds to stimulation  Airway & Oxygen Therapy:Patient Spontanous Breathing and Patient connected to face mask oxgen  Post-op Assessment:  Report given to PACU RN and Post -op Vital signs reviewed and stable  Post vital signs:  Reviewed and stable  Last Vitals:  Vitals:   08/01/16 0441 08/01/16 1200  BP: 112/71 125/73  Pulse: 64 69  Resp: 20 18  Temp: 36.9 C 03.3 C    Complications: No apparent anesthesia complications

## 2016-08-01 NOTE — Progress Notes (Signed)
PROGRESS NOTE    Nathan Medina  CHY:[id number] DOB: 1951/03/18 DOA: 07/31/2016 PCP: Thressa Sheller, MD    Brief Narrative:  66 yo male with t2dm, presents with throat pain, worsening symptoms for the last 4 days, associated with dysphagia.  On the initial presentation found septic, CT showed right tonsillar abscess. Patient admitted for broad spectrum IV antibiotics and surgical consultation.   Assessment & Plan:   Principal Problem:   Peritonsillar abscess Active Problems:   Type 2 diabetes mellitus with renal manifestations (HCC)   Essential hypertension   Renal failure (ARF), acute on chronic ([place])   1. Right peritonsillar abscess. Continue antibiotic therapy with clindamycin, will follow on cultures, cell count and temperature curve. Plan for I&D today per ENT.   2. T2DM. Will continue glucose cover and monitoring with insulin sliding scale, patient npo for now for surgical procedure. Serum glucose 224.   3. HTN. Continue to hold on antihypertensive medications, to prevent hypotension, continue IV fluids. As needed hydralazine.   4. CKD stage 3. Renal function with cr at 2,22 from 2,36, k at 4,6 and serum bicarbonate at 21, will continue hydration with isotonic IV fluids, follow on renal panel in am.     DVT prophylaxis: enoxaparin  Code Status: full  Family Communication: no family at the bedside  Disposition Plan: home    Consultants:   ENT  Procedures:    Antimicrobials:   Clindamycin     Subjective:Positive throat pain, worse with swallowing, no nausea or vomiting, no chest pain, dyspnea or abdominal pain.   Objective: Vitals:   08/01/16 0100 08/01/16 0130 08/01/16 0210 08/01/16 0441  BP: 135/87 131/90 115/84 112/71  Pulse: 84 82 74 64  Resp: 20 19 12 20   Temp:   97.7 F (36.5 C) 98.4 F (36.9 C)  TempSrc:   Oral Oral  SpO2: 97% 97% 97% 100%  Weight:   99.8 kg (220 lb 0.3 oz)   Height:   5\' 8"  (1.727 m)     Intake/Output Summary (Last 24  hours) at 08/01/16 0946 Last data filed at 08/01/16 0600  Gross per 24 hour  Intake          1479.17 ml  Output              650 ml  Net           829.17 ml   Filed Weights   07/31/16 1611 07/31/16 1956 08/01/16 0210  Weight: 98 kg (216 lb) 98 kg (216 lb) 99.8 kg (220 lb 0.3 oz)    Examination:  General exam: deconditioned and ill looking appearing E ENT: positive pallor, oral mucosa dry, no icterus.  Respiratory system: Clear to auscultation. Respiratory effort normal. No wheezing, rales or rhonchi.  Cardiovascular system: S1 & S2 heard, RRR. No JVD, murmurs, rubs, gallops or clicks. No pedal edema. Gastrointestinal system: Abdomen is nondistended, soft and nontender. No organomegaly or masses felt. Normal bowel sounds heard. Central nervous system: Alert and oriented. No focal neurological deficits. Extremities: Symmetric 5 x 5 power. Skin: No rashes, lesions or ulcers    Data Reviewed: I have personally reviewed following labs and imaging studies  CBC:  Recent Labs Lab 07/31/16 1641 08/01/16 0306  WBC 13.0* 11.7*  NEUTROABS 10.1* 8.5*  HGB 16.4 14.6  HCT 47.5 43.9  MCV 91.9 93.4  PLT 197 878*   Basic Metabolic Panel:  Recent Labs Lab 07/31/16 1936 08/01/16 0306  NA 136 139  K 5.1 4.6  CL  105 109  CO2 20* 21*  GLUCOSE 253* 224*  BUN 56* 55*  CREATININE 2.36* 2.22*  CALCIUM 9.2 8.9   GFR: Estimated Creatinine Clearance: 37.5 mL/min (by C-G formula based on SCr of 2.22 mg/dL (H)). Liver Function Tests:  Recent Labs Lab 07/31/16 1936 08/01/16 0306  AST 25 21  ALT 33 31  ALKPHOS 108 109  BILITOT 1.4* 1.1  PROT 6.7 6.4*  ALBUMIN 3.1* 2.8*   No results for input(s): LIPASE, AMYLASE in the last 168 hours. No results for input(s): AMMONIA in the last 168 hours. Coagulation Profile: No results for input(s): INR, PROTIME in the last 168 hours. Cardiac Enzymes: No results for input(s): CKTOTAL, CKMB, CKMBINDEX, TROPONINI in the last 168 hours. BNP  (last 3 results) No results for input(s): PROBNP in the last 8760 hours. HbA1C: No results for input(s): HGBA1C in the last 72 hours. CBG:  Recent Labs Lab 07/31/16 1620 07/31/16 2010 08/01/16 0251 08/01/16 0445 08/01/16 0809  GLUCAP 296* 234* 225* 195* 193*   Lipid Profile: No results for input(s): CHOL, HDL, LDLCALC, TRIG, CHOLHDL, LDLDIRECT in the last 72 hours. Thyroid Function Tests: No results for input(s): TSH, T4TOTAL, FREET4, T3FREE, THYROIDAB in the last 72 hours. Anemia Panel: No results for input(s): VITAMINB12, FOLATE, FERRITIN, TIBC, IRON, RETICCTPCT in the last 72 hours. Sepsis Labs:  Recent Labs Lab 07/31/16 1657 07/31/16 1946 08/01/16 0306  LATICACIDVEN 1.99* 2.13* 1.5    Recent Results (from the past 240 hour(s))  Blood culture (routine x 2)     Status: None (Preliminary result)   Collection Time: 07/31/16  4:36 PM  Result Value Ref Range Status   Specimen Description RIGHT ANTECUBITAL  Final   Special Requests BOTTLES DRAWN AEROBIC AND ANAEROBIC 5CC  Final   Culture PENDING  Incomplete   Report Status PENDING  Incomplete         Radiology Studies: Ct Soft Tissue Neck Wo Contrast  Result Date: 07/31/2016 CLINICAL DATA:  66 y/o  M; sore throat and peritonsillar abscess. EXAM: CT NECK WITHOUT CONTRAST TECHNIQUE: Multidetector CT imaging of the neck was performed following the standard protocol without intravenous contrast. COMPARISON:  None. FINDINGS: Pharynx and larynx: Marked swelling of the right palatine tonsil and right greater than left or pharyngeal mucosal thickening. There are foci of hypoattenuation within the right palatine tonsil. Salivary glands: No inflammation, mass, or stone. Thyroid: Multiple thyroid nodules the largest on the left with coarse calcification measuring up to 25 mm. Lymph nodes: None enlarged or abnormal density. Vascular: Mild calcific atherosclerosis of the aortic arch and left carotid bifurcation. Limited intracranial:  Negative. Visualized orbits: Negative. Mastoids and visualized paranasal sinuses: Clear. Skeleton: Moderate cervical spondylosis with discogenic degenerative changes greatest from the C4 through C7 levels where there is disc space narrowing and osteophytes. No high-grade bony canal stenosis or foraminal narrowing. Upper chest: 12 mm ground-glass nodule in the right lung apex (series 3, image 102). Other: None. IMPRESSION: 1. Marked swelling of the right palatine tonsil with foci of hypoattenuation probably representing abscess/phlegmonous change. Delineation of abscess is suboptimal in the absence of intravenous contrast. No extension into deep cervical compartments or retropharyngeal fluid collection. 2. Multiple thyroid nodules measuring up to 25 mm in the left lobe of thyroid. Further evaluation with thyroid ultrasound is recommended on a nonemergent basis. 3. 12 mm ground-glass nodule in the right lung apex. Initial follow-up with CT at 6-12 months is recommended to confirm persistence. If persistent, repeat CT is recommended every 2 years  until 5 years of stability has been established. This recommendation follows the consensus statement: Guidelines for Management of Incidental Pulmonary Nodules Detected on CT Images: From the Fleischner Society 2017; Radiology 2017; 284:228-243. 4. Moderate cervical spondylosis. Electronically Signed   By: Kristine Garbe M.D.   On: 07/31/2016 21:34        Scheduled Meds: . chlorhexidine  15 mL Mouth Rinse BID  . clindamycin (CLEOCIN) IV  600 mg Intravenous Q8H  . insulin aspart  0-9 Units Subcutaneous Q4H  . mouth rinse  15 mL Mouth Rinse q12n4p   Continuous Infusions: . sodium chloride 125 mL/hr at 08/01/16 0258     LOS: 1 day       Tawni Millers, MD [facility] Pager [phone]  If 7PM-7AM, please contact night-coverage [email or web address] Password [email or web address] 08/01/2016, 9:46 AM

## 2016-08-01 NOTE — Progress Notes (Signed)
PHARMACY - PHYSICIAN COMMUNICATION CRITICAL VALUE ALERT - BLOOD CULTURE IDENTIFICATION (BCID)  Results for orders placed or performed during the hospital encounter of 07/31/16  Blood Culture ID Panel (Reflexed) (Collected: 07/31/2016  4:41 PM)  Result Value Ref Range   Enterococcus species NOT DETECTED NOT DETECTED   Listeria monocytogenes NOT DETECTED NOT DETECTED   Staphylococcus species DETECTED (A) NOT DETECTED   Staphylococcus aureus NOT DETECTED NOT DETECTED   Methicillin resistance DETECTED (A) NOT DETECTED   Streptococcus species NOT DETECTED NOT DETECTED   Streptococcus agalactiae NOT DETECTED NOT DETECTED   Streptococcus pneumoniae NOT DETECTED NOT DETECTED   Streptococcus pyogenes NOT DETECTED NOT DETECTED   Acinetobacter baumannii NOT DETECTED NOT DETECTED   Enterobacteriaceae species NOT DETECTED NOT DETECTED   Enterobacter cloacae complex NOT DETECTED NOT DETECTED   Escherichia coli NOT DETECTED NOT DETECTED   Klebsiella oxytoca NOT DETECTED NOT DETECTED   Klebsiella pneumoniae NOT DETECTED NOT DETECTED   Proteus species NOT DETECTED NOT DETECTED   Serratia marcescens NOT DETECTED NOT DETECTED   Haemophilus influenzae NOT DETECTED NOT DETECTED   Neisseria meningitidis NOT DETECTED NOT DETECTED   Pseudomonas aeruginosa NOT DETECTED NOT DETECTED   Candida albicans NOT DETECTED NOT DETECTED   Candida glabrata NOT DETECTED NOT DETECTED   Candida krusei NOT DETECTED NOT DETECTED   Candida parapsilosis NOT DETECTED NOT DETECTED   Candida tropicalis NOT DETECTED NOT DETECTED    Name of physician (or Provider) Contacted: Arrien  Changes to prescribed antibiotics required: none, already on Clindamycin for tonsillar abscess, considering as contamination.  Nancey Kreitz A 08/01/2016  4:09 PM

## 2016-08-01 NOTE — Op Note (Signed)
08/01/2016 2:24 PM    Aleatha Borer  [id number]   Pre-Op Dx:  RIGHT  Peritonsillar Abscess  Post-op Dx: same  Proc:  I&D RIGHT PTA  Surg:  [name] T MD  Anes:  GOT  EBL:  10 ml  Comp:  none  Findings:  Protruding RIGHT tonsil with 5 ml frank pus in a superior pole abscess.    Procedure: With the patient in a comfortable supine position, GOT was administered in standard fashion.    At an appropriate level,  the table was turned 90 degrees away from anesthesia  and placed in Trendelenberg position. Routine clean preparation and draping was performed. Taking care to protect lips, teeth and endotracheal tube, the Crowe-Davis mouth gag was introduced, expanded for visualization, and suspended from the [place] stand in the standard fashion.  The findings were as described as above.  Electrocautery was used to perform a crescent incision above the tonsil.  This was carried down on the capsule of the tonsil.  A frank abscess cavity was encountered, and widely opened.  Hemostasis was spontaneous.  The cavity was irrigated with sterile saline.  The mouth gag was relaxed for several minutes.  Upon re-expansion, hemostasis was observed.  At this point the procedure was completed.  The mouth gag was relaxed and removed.  The dental status was  Intact.  The patient was returned to Anesthesia, awakened, extubated, and transferred to PACU in stable condition.    Dispo:   PACU to his room  Plan:  Hydration, antibiosis, analgesia.   Could be discharged to home tomorrow if taking good po.  Recheck my office 2 weeks.  Tyson Alias MD

## 2016-08-01 NOTE — Anesthesia Postprocedure Evaluation (Addendum)
Anesthesia Post Note  Patient: Nathan Medina  Procedure(s) Performed: Procedure(s) (LRB): INCISION AND DRAINAGE RIGHT PERI-TONSILLAR ABSCESS (Right)  Patient location during evaluation: PACU Anesthesia Type: General Level of consciousness: awake and alert Pain management: pain level controlled Vital Signs Assessment: post-procedure vital signs reviewed and stable Respiratory status: spontaneous breathing, nonlabored ventilation, respiratory function stable and patient connected to nasal cannula oxygen Cardiovascular status: blood pressure returned to baseline and stable Postop Assessment: no signs of nausea or vomiting Anesthetic complications: no       Last Vitals:  Vitals:   08/01/16 1435 08/01/16 1445  BP: 119/68 124/69  Pulse: 74 71  Resp: 20 14  Temp: (P) 36.4 C     Last Pain:  Vitals:   08/01/16 1200  TempSrc: Oral  PainSc:                  [name] S

## 2016-08-01 NOTE — Anesthesia Preprocedure Evaluation (Signed)
Anesthesia Evaluation  Patient identified by MRN, date of birth, ID band Patient awake    Reviewed: Allergy & Precautions, NPO status , Patient's Chart, lab work & pertinent test results  Airway Mallampati: II  TM Distance: >3 FB Neck ROM: Limited    Dental no notable dental hx.    Pulmonary neg pulmonary ROS,    Pulmonary exam normal breath sounds clear to auscultation       Cardiovascular hypertension, Normal cardiovascular exam Rhythm:Regular Rate:Normal     Neuro/Psych negative neurological ROS  negative psych ROS   GI/Hepatic Neg liver ROS, GERD  ,  Endo/Other  diabetes, Insulin Dependent  Renal/GU negative Renal ROS  negative genitourinary   Musculoskeletal negative musculoskeletal ROS (+)   Abdominal   Peds negative pediatric ROS (+)  Hematology negative hematology ROS (+)   Anesthesia Other Findings   Reproductive/Obstetrics negative OB ROS                             Anesthesia Physical Anesthesia Plan  ASA: III  Anesthesia Plan: General   Post-op Pain Management:    Induction: Intravenous  Airway Management Planned: Oral ETT  Additional Equipment:   Intra-op Plan:   Post-operative Plan: Extubation in OR  Informed Consent: I have reviewed the patients History and Physical, chart, labs and discussed the procedure including the risks, benefits and alternatives for the proposed anesthesia with the patient or authorized representative who has indicated his/her understanding and acceptance.   Dental advisory given  Plan Discussed with: CRNA and Surgeon  Anesthesia Plan Comments:         Anesthesia Quick Evaluation

## 2016-08-02 DIAGNOSIS — N183 Chronic kidney disease, stage 3 (moderate): Secondary | ICD-10-CM

## 2016-08-02 DIAGNOSIS — R739 Hyperglycemia, unspecified: Secondary | ICD-10-CM

## 2016-08-02 DIAGNOSIS — N179 Acute kidney failure, unspecified: Secondary | ICD-10-CM

## 2016-08-02 DIAGNOSIS — N178 Other acute kidney failure: Secondary | ICD-10-CM

## 2016-08-02 LAB — CULTURE, BLOOD (ROUTINE X 2)

## 2016-08-02 LAB — BASIC METABOLIC PANEL
Anion gap: 7 (ref 5–15)
BUN: 40 mg/dL — ABNORMAL HIGH (ref 6–20)
CO2: 21 mmol/L — ABNORMAL LOW (ref 22–32)
Calcium: 9.3 mg/dL (ref 8.9–10.3)
Chloride: 112 mmol/L — ABNORMAL HIGH (ref 101–111)
Creatinine, Ser: 1.7 mg/dL — ABNORMAL HIGH (ref 0.61–1.24)
GFR calc Af Amer: 47 mL/min — ABNORMAL LOW (ref >60)
GFR calc non Af Amer: 40 mL/min — ABNORMAL LOW (ref >60)
Glucose, Bld: 190 mg/dL — ABNORMAL HIGH (ref 65–99)
Potassium: 5.3 mmol/L — ABNORMAL HIGH (ref 3.5–5.1)
Sodium: 140 mmol/L (ref 135–145)

## 2016-08-02 LAB — CBC WITH DIFFERENTIAL/PLATELET
Basophils Absolute: 0 10*3/uL (ref 0.0–0.1)
Basophils Relative: 0 %
Eosinophils Absolute: 0 10*3/uL (ref 0.0–0.7)
Eosinophils Relative: 0 %
HCT: 41.9 % (ref 39.0–52.0)
Hemoglobin: 13.7 g/dL (ref 13.0–17.0)
Lymphocytes Relative: 10 %
Lymphs Abs: 1 10*3/uL (ref 0.7–4.0)
MCH: 30.9 pg (ref 26.0–34.0)
MCHC: 32.7 g/dL (ref 30.0–36.0)
MCV: 94.4 fL (ref 78.0–100.0)
Monocytes Absolute: 0.2 10*3/uL (ref 0.1–1.0)
Monocytes Relative: 2 %
Neutro Abs: 8.3 10*3/uL — ABNORMAL HIGH (ref 1.7–7.7)
Neutrophils Relative %: 88 %
Platelets: 137 10*3/uL — ABNORMAL LOW (ref 150–400)
RBC: 4.44 MIL/uL (ref 4.22–5.81)
RDW: 14.7 % (ref 11.5–15.5)
WBC: 9.5 10*3/uL (ref 4.0–10.5)

## 2016-08-02 LAB — GLUCOSE, CAPILLARY
Glucose-Capillary: 179 mg/dL — ABNORMAL HIGH (ref 65–99)
Glucose-Capillary: 187 mg/dL — ABNORMAL HIGH (ref 65–99)
Glucose-Capillary: 203 mg/dL — ABNORMAL HIGH (ref 65–99)
Glucose-Capillary: 259 mg/dL — ABNORMAL HIGH (ref 65–99)
Glucose-Capillary: 266 mg/dL — ABNORMAL HIGH (ref 65–99)
Glucose-Capillary: 301 mg/dL — ABNORMAL HIGH (ref 65–99)
Glucose-Capillary: 324 mg/dL — ABNORMAL HIGH (ref 65–99)

## 2016-08-02 MED ORDER — BOOST / RESOURCE BREEZE PO LIQD
1.0000 | Freq: Three times a day (TID) | ORAL | Status: DC
  Administered 2016-08-02 – 2016-08-03 (×2): 1 via ORAL

## 2016-08-02 MED ORDER — PANTOPRAZOLE SODIUM 40 MG PO TBEC
40.0000 mg | DELAYED_RELEASE_TABLET | Freq: Every day | ORAL | Status: DC
  Administered 2016-08-02 – 2016-08-03 (×2): 40 mg via ORAL
  Filled 2016-08-02 (×2): qty 1

## 2016-08-02 MED ORDER — SODIUM CHLORIDE 0.45 % IV SOLN
INTRAVENOUS | Status: DC
  Administered 2016-08-02: 17:00:00 via INTRAVENOUS

## 2016-08-02 NOTE — Progress Notes (Signed)
Initial Nutrition Assessment  DOCUMENTATION CODES:   Obesity unspecified  INTERVENTION:   Provide Boost Breeze po BID, each supplement provides 250 kcal and 9 grams of protein Encourage PO intake RD to continue to monitor  NUTRITION DIAGNOSIS:   Inadequate oral intake related to inability to eat as evidenced by per patient/family report.  GOAL:   Patient will meet greater than or equal to 90% of their needs  MONITOR:   PO intake, Supplement acceptance, Labs, Weight trends, I & O's  REASON FOR ASSESSMENT:   Malnutrition Screening Tool    ASSESSMENT:   65 yo male with t2dm, presents with throat pain, worsening symptoms for the last 4 days, associated with dysphagia. On the initial presentation found septic, CT showed right tonsillar abscess. Patient admitted for broad spectrum IV antibiotics and surgical consultation. Patient underwent successful I&D.  3/14: INCISION AND DRAINAGE RIGHT PERI-TONSILLAR ABSCESS   Patient in room with visitor at bedside. Pt just finished his lunch meal of fruit and cottage cheese. States he feels better and is eating better. However, he still is having difficulty swallowing d/t swelling. Recommended high protein liquids that are appropriate on the renal diet. Pt is willing to try Boost Breeze supplements. RD to order.  Pt states his UBW is  220-225 lb. States he has lost 8 lb since Saturday 3/10. At admission, pt weighed 216 lb. Any weight loss recently has been insignificant. Pt's PO intake is improving.    Medications: Protonix tablet daily Labs reviewed: CBGs: 179-259 Elevated K GFR: 40  Diet Order:  Diet renal with fluid restriction Fluid restriction: 1200 mL Fluid; Room service appropriate? Yes; Fluid consistency: Thin  Skin:  Reviewed, no issues  Last BM:  3/15  Height:   Ht Readings from Last 1 Encounters:  08/01/16 5\' 8"  (1.727 m)    Weight:   Wt Readings from Last 1 Encounters:  08/02/16 223 lb 15.8 oz (101.6 kg)     Ideal Body Weight:  70 kg  BMI:  Body mass index is 34.06 kg/m.  Estimated Nutritional Needs:   Kcal:  1900-2100  Protein:  80-90g  Fluid:  Per MD  EDUCATION NEEDS:   Education needs addressed  Clayton Bibles, MS, RD, LDN Pager: [phone] After Hours Pager: [phone]

## 2016-08-02 NOTE — Progress Notes (Signed)
PROGRESS NOTE    Nathan Medina  OVF:[id number] DOB: June 28, 1950 DOA: 07/31/2016 PCP: Thressa Sheller, MD    Brief Narrative:  66 yo male with t2dm, presents with throat pain, worsening symptoms for the last 4 days, associated with dysphagia. On the initial presentation found septic, CT showed right tonsillar abscess. Patient admitted for broad spectrum IV antibiotics and surgical consultation. Patient underwent successful I&D.    Assessment & Plan:   Principal Problem:   Peritonsillar abscess Active Problems:   Type 2 diabetes mellitus with renal manifestations (HCC)   Essential hypertension   Renal failure (ARF), acute on chronic ([place])   1. Right peritonsillar abscess. Responding well to antibiotic therapy with IV clindamycin. Successful I&D per ENT. Will follow on cultures. Noted one bottle coag negative staph, likely contaminant. Follow cultures no growth. Continue pain control. Continue chlorhexidine.   2. T2DM. Continue glucose cover and monitoring with insulin sliding scale. Capillary glucose [phone].  Tolerating well po diet  3. HTN. Blood pressure systolic 601 to 093, continue to hold on antihypertensive medications, to prevent hypotension, continue IV fluids and needed hydralazine.   4. CKD stage 3 with AKI. Renal function with cr down to 1,7 from 2,22. K mildly elevated at 5,3 with serum bicarbonate 21, will follow on renal panel in am, avoid hypotension or nephrotoxic medications. Continue IV fluids with half normal saline to prevent worsening hyperchloremia.   DVT prophylaxis: enoxaparin  Code Status: full  Family Communication: no family at the bedside  Disposition Plan: home   Consultants:   ENT   Procedures:     Antimicrobials:   Clindamycin     Subjective: Patient feeling better, moderate throat pain, no dyspnea, intermittent cough. Positive reflux. No nausea or vomiting, tolerating po well.   Objective: Vitals:   08/01/16 1542 08/01/16  2057 08/02/16 0605 08/02/16 1217  BP: (!) 128/99 125/71 (!) 142/90 130/82  Pulse: 69 68 63 73  Resp: 15 16 18 18   Temp: 97.8 F (36.6 C) 98.3 F (36.8 C) 97.7 F (36.5 C) 98.2 F (36.8 C)  TempSrc:  Oral Oral Oral  SpO2: 99% 95% 100% 98%  Weight:   101.6 kg (223 lb 15.8 oz)   Height:        Intake/Output Summary (Last 24 hours) at 08/02/16 1218 Last data filed at 08/02/16 1217  Gross per 24 hour  Intake             1850 ml  Output             1355 ml  Net              495 ml   Filed Weights   07/31/16 1956 08/01/16 0210 08/02/16 0605  Weight: 98 kg (216 lb) 99.8 kg (220 lb 0.3 oz) 101.6 kg (223 lb 15.8 oz)    Examination:  General exam: deconditioned E ENT: no pallor, no icterus, oral mucosa moist.  Respiratory system: Clear to auscultation. Respiratory effort normal. No wheezing, rales or rhonchi.  Cardiovascular system: S1 & S2 heard, RRR. No JVD, murmurs, rubs, gallops or clicks. No pedal edema. Gastrointestinal system: Abdomen is nondistended, soft and nontender. No organomegaly or masses felt. Normal bowel sounds heard. Central nervous system: Alert and oriented. No focal neurological deficits. Extremities: Symmetric 5 x 5 power. Skin: No rashes, lesions or ulcers     Data Reviewed: I have personally reviewed following labs and imaging studies  CBC:  Recent Labs Lab 07/31/16 1641 08/01/16 0306 08/02/16 0525  WBC 13.0* 11.7* 9.5  NEUTROABS 10.1* 8.5* 8.3*  HGB 16.4 14.6 13.7  HCT 47.5 43.9 41.9  MCV 91.9 93.4 94.4  PLT 197 141* 315*   Basic Metabolic Panel:  Recent Labs Lab 07/31/16 1936 08/01/16 0306 08/02/16 0525  NA 136 139 140  K 5.1 4.6 5.3*  CL 105 109 112*  CO2 20* 21* 21*  GLUCOSE 253* 224* 190*  BUN 56* 55* 40*  CREATININE 2.36* 2.22* 1.70*  CALCIUM 9.2 8.9 9.3   GFR: Estimated Creatinine Clearance: 49.4 mL/min (A) (by C-G formula based on SCr of 1.7 mg/dL (H)). Liver Function Tests:  Recent Labs Lab 07/31/16 1936  08/01/16 0306  AST 25 21  ALT 33 31  ALKPHOS 108 109  BILITOT 1.4* 1.1  PROT 6.7 6.4*  ALBUMIN 3.1* 2.8*   No results for input(s): LIPASE, AMYLASE in the last 168 hours. No results for input(s): AMMONIA in the last 168 hours. Coagulation Profile: No results for input(s): INR, PROTIME in the last 168 hours. Cardiac Enzymes: No results for input(s): CKTOTAL, CKMB, CKMBINDEX, TROPONINI in the last 168 hours. BNP (last 3 results) No results for input(s): PROBNP in the last 8760 hours. HbA1C: No results for input(s): HGBA1C in the last 72 hours. CBG:  Recent Labs Lab 08/01/16 1740 08/01/16 2112 08/02/16 0048 08/02/16 0409 08/02/16 0726  GLUCAP 211* 263* 203* 187* 179*   Lipid Profile: No results for input(s): CHOL, HDL, LDLCALC, TRIG, CHOLHDL, LDLDIRECT in the last 72 hours. Thyroid Function Tests: No results for input(s): TSH, T4TOTAL, FREET4, T3FREE, THYROIDAB in the last 72 hours. Anemia Panel: No results for input(s): VITAMINB12, FOLATE, FERRITIN, TIBC, IRON, RETICCTPCT in the last 72 hours. Sepsis Labs:  Recent Labs Lab 07/31/16 1657 07/31/16 1946 08/01/16 0306  LATICACIDVEN 1.99* 2.13* 1.5    Recent Results (from the past 240 hour(s))  Blood culture (routine x 2)     Status: None (Preliminary result)   Collection Time: 07/31/16  4:36 PM  Result Value Ref Range Status   Specimen Description RIGHT ANTECUBITAL  Final   Special Requests BOTTLES DRAWN AEROBIC AND ANAEROBIC 5CC  Final   Culture   Final    NO GROWTH < 24 HOURS Performed at [facility], [place] [address] [place], [place] [address]    Report Status PENDING  Incomplete  Blood culture (routine x 2)     Status: None (Preliminary result)   Collection Time: 07/31/16  4:41 PM  Result Value Ref Range Status   Specimen Description BLOOD LEFT HAND  Final   Special Requests IN PEDIATRIC BOTTLE 3CC  Final   Culture  Setup Time   Final    GRAM POSITIVE COCCI IN CLUSTERS AEROBIC BOTTLE ONLY Organism  ID to follow CRITICAL RESULT CALLED TO, READ BACK BY AND VERIFIED WITH: D. Wofford Pharm.D. 16:00 08/01/16 ([email or web address]) Performed at [facility], [place] [address], [place], [place] [address]    Culture GRAM POSITIVE COCCI  Final   Report Status PENDING  Incomplete  Blood Culture ID Panel (Reflexed)     Status: Abnormal   Collection Time: 07/31/16  4:41 PM  Result Value Ref Range Status   Enterococcus species NOT DETECTED NOT DETECTED Final   Listeria monocytogenes NOT DETECTED NOT DETECTED Final   Staphylococcus species DETECTED (A) NOT DETECTED Final    Comment: Methicillin (oxacillin) resistant coagulase negative staphylococcus. Possible blood culture contaminant (unless isolated from more than one blood culture draw or clinical case suggests pathogenicity). No antibiotic  treatment is indicated for blood  culture contaminants. CRITICAL RESULT CALLED TO, READ BACK BY AND VERIFIED WITH: D. Wofford Pharm.D. 16:00 08/01/16 ([email or web address])    Staphylococcus aureus NOT DETECTED NOT DETECTED Final   Methicillin resistance DETECTED (A) NOT DETECTED Final    Comment: CRITICAL RESULT CALLED TO, READ BACK BY AND VERIFIED WITH: D. Wofford Pharm.D. 16:00 08/01/16 ([email or web address])    Streptococcus species NOT DETECTED NOT DETECTED Final   Streptococcus agalactiae NOT DETECTED NOT DETECTED Final   Streptococcus pneumoniae NOT DETECTED NOT DETECTED Final   Streptococcus pyogenes NOT DETECTED NOT DETECTED Final   Acinetobacter baumannii NOT DETECTED NOT DETECTED Final   Enterobacteriaceae species NOT DETECTED NOT DETECTED Final   Enterobacter cloacae complex NOT DETECTED NOT DETECTED Final   Escherichia coli NOT DETECTED NOT DETECTED Final   Klebsiella oxytoca NOT DETECTED NOT DETECTED Final   Klebsiella pneumoniae NOT DETECTED NOT DETECTED Final   Proteus species NOT DETECTED NOT DETECTED Final   Serratia marcescens NOT DETECTED NOT DETECTED Final   Haemophilus influenzae NOT DETECTED NOT DETECTED Final    Neisseria meningitidis NOT DETECTED NOT DETECTED Final   Pseudomonas aeruginosa NOT DETECTED NOT DETECTED Final   Candida albicans NOT DETECTED NOT DETECTED Final   Candida glabrata NOT DETECTED NOT DETECTED Final   Candida krusei NOT DETECTED NOT DETECTED Final   Candida parapsilosis NOT DETECTED NOT DETECTED Final   Candida tropicalis NOT DETECTED NOT DETECTED Final    Comment: Performed at [facility], [place] [address], [place], [place] [address]  Surgical pcr screen     Status: None   Collection Time: 08/01/16 12:17 PM  Result Value Ref Range Status   MRSA, PCR NEGATIVE NEGATIVE Final   Staphylococcus aureus NEGATIVE NEGATIVE Final    Comment:        The Xpert SA Assay (FDA approved for NASAL specimens in patients over 58 years of age), is one component of a comprehensive surveillance program.  Test performance has been validated by [facility] for patients greater than or equal to 55 year old. It is not intended to diagnose infection nor to guide or monitor treatment.          Radiology Studies: Ct Soft Tissue Neck Wo Contrast  Result Date: 07/31/2016 CLINICAL DATA:  66 y/o  M; sore throat and peritonsillar abscess. EXAM: CT NECK WITHOUT CONTRAST TECHNIQUE: Multidetector CT imaging of the neck was performed following the standard protocol without intravenous contrast. COMPARISON:  None. FINDINGS: Pharynx and larynx: Marked swelling of the right palatine tonsil and right greater than left or pharyngeal mucosal thickening. There are foci of hypoattenuation within the right palatine tonsil. Salivary glands: No inflammation, mass, or stone. Thyroid: Multiple thyroid nodules the largest on the left with coarse calcification measuring up to 25 mm. Lymph nodes: None enlarged or abnormal density. Vascular: Mild calcific atherosclerosis of the aortic arch and left carotid bifurcation. Limited intracranial: Negative. Visualized orbits: Negative. Mastoids and visualized paranasal  sinuses: Clear. Skeleton: Moderate cervical spondylosis with discogenic degenerative changes greatest from the C4 through C7 levels where there is disc space narrowing and osteophytes. No high-grade bony canal stenosis or foraminal narrowing. Upper chest: 12 mm ground-glass nodule in the right lung apex (series 3, image 102). Other: None. IMPRESSION: 1. Marked swelling of the right palatine tonsil with foci of hypoattenuation probably representing abscess/phlegmonous change. Delineation of abscess is suboptimal in the absence of intravenous contrast. No extension into deep cervical compartments or retropharyngeal fluid collection. 2. Multiple thyroid nodules  measuring up to 25 mm in the left lobe of thyroid. Further evaluation with thyroid ultrasound is recommended on a nonemergent basis. 3. 12 mm ground-glass nodule in the right lung apex. Initial follow-up with CT at 6-12 months is recommended to confirm persistence. If persistent, repeat CT is recommended every 2 years until 5 years of stability has been established. This recommendation follows the consensus statement: Guidelines for Management of Incidental Pulmonary Nodules Detected on CT Images: From the Fleischner Society 2017; Radiology 2017; 284:228-243. 4. Moderate cervical spondylosis. Electronically Signed   By: Kristine Garbe M.D.   On: 07/31/2016 21:34        Scheduled Meds: . chlorhexidine  15 mL Mouth Rinse BID  . clindamycin (CLEOCIN) IV  600 mg Intravenous Q8H  . insulin aspart  0-9 Units Subcutaneous Q4H  . mouth rinse  15 mL Mouth Rinse q12n4p  . pantoprazole  40 mg Oral Daily   Continuous Infusions:   LOS: 2 days     Samary Shatz Gerome Apley, MD [facility] Pager [phone]  If 7PM-7AM, please contact night-coverage [email or web address] Password [facility] 08/02/2016, 12:18 PM

## 2016-08-03 LAB — CBC WITH DIFFERENTIAL/PLATELET
Basophils Absolute: 0 10*3/uL (ref 0.0–0.1)
Basophils Relative: 0 %
Eosinophils Absolute: 0.1 10*3/uL (ref 0.0–0.7)
Eosinophils Relative: 0 %
HCT: 40.5 % (ref 39.0–52.0)
Hemoglobin: 13.3 g/dL (ref 13.0–17.0)
Lymphocytes Relative: 14 %
Lymphs Abs: 1.7 10*3/uL (ref 0.7–4.0)
MCH: 30.5 pg (ref 26.0–34.0)
MCHC: 32.8 g/dL (ref 30.0–36.0)
MCV: 92.9 fL (ref 78.0–100.0)
Monocytes Absolute: 1 10*3/uL (ref 0.1–1.0)
Monocytes Relative: 8 %
Neutro Abs: 9.4 10*3/uL — ABNORMAL HIGH (ref 1.7–7.7)
Neutrophils Relative %: 78 %
Platelets: 151 10*3/uL (ref 150–400)
RBC: 4.36 MIL/uL (ref 4.22–5.81)
RDW: 14.6 % (ref 11.5–15.5)
WBC: 12.1 10*3/uL — ABNORMAL HIGH (ref 4.0–10.5)

## 2016-08-03 LAB — GLUCOSE, CAPILLARY
Glucose-Capillary: 221 mg/dL — ABNORMAL HIGH (ref 65–99)
Glucose-Capillary: 199 mg/dL — ABNORMAL HIGH (ref 65–99)
Glucose-Capillary: 260 mg/dL — ABNORMAL HIGH (ref 65–99)

## 2016-08-03 LAB — BASIC METABOLIC PANEL
Anion gap: 6 (ref 5–15)
BUN: 48 mg/dL — ABNORMAL HIGH (ref 6–20)
CO2: 21 mmol/L — ABNORMAL LOW (ref 22–32)
Calcium: 9.4 mg/dL (ref 8.9–10.3)
Chloride: 108 mmol/L (ref 101–111)
Creatinine, Ser: 1.8 mg/dL — ABNORMAL HIGH (ref 0.61–1.24)
GFR calc Af Amer: 44 mL/min — ABNORMAL LOW (ref >60)
GFR calc non Af Amer: 38 mL/min — ABNORMAL LOW (ref >60)
Glucose, Bld: 226 mg/dL — ABNORMAL HIGH (ref 65–99)
Potassium: 4.4 mmol/L (ref 3.5–5.1)
Sodium: 135 mmol/L (ref 135–145)

## 2016-08-03 MED ORDER — CLINDAMYCIN HCL 300 MG PO CAPS: 300.0000 mg | ORAL_CAPSULE | Freq: Four times a day (QID) | ORAL | 0 refills | Status: DC

## 2016-08-03 MED ORDER — CHLORHEXIDINE GLUCONATE 0.12 % MT SOLN: 15.0000 mL | Freq: Two times a day (BID) | OROMUCOSAL | 0 refills | Status: DC

## 2016-08-03 MED ORDER — ACETAMINOPHEN 325 MG PO TABS: 650.0000 mg | ORAL_TABLET | Freq: Four times a day (QID) | ORAL | 0 refills | Status: DC | PRN

## 2016-08-03 NOTE — Discharge Summary (Signed)
Physician Discharge Summary  Nathan Medina IWP:[id number] DOB: [birth year] DOA: 07/31/2016  PCP: Thressa Sheller, MD  Admit date: 07/31/2016 Discharge date: 08/03/2016  Admitted From:  Home  Disposition:  Home   Recommendations for Outpatient Follow-up:  1. Follow up with PCP in 1-week 2. Patient will be discharged on clindamycin for 10 more days. 3. 12 mm groundglass nodule in the right lung apex, follow-up CT scan in 6-12 months is recommended  Home Health: Yes  Equipment/Devices: Home   Discharge Condition: Home  CODE STATUS: Full  Diet recommendation: Heart Healthy / Carb Modified   Brief/Interim Summary: This is a 66 year old gentleman who presented to hospital with a chief complaint of throat pain. For last 3-4 days prior to hospitalization he has been experiencing worsening throat pain, associated with odynophagia and difficulty speaking, his symptoms were refractive to outpatient amoxicillin. On initial physical examination his blood pressure was 135/87, heart rate 82, respiratory 19, oxygen saturation 97%, positive right tonsillar edema, lungs were clear to auscultation, heart S1-S2 present rhythmic, his abdomen was soft nontender. Lower extremities no edema. Sodium 136, potassium 5.1, chloride 105, bicarbonate 20, glucose 253, BUN 56, creatinine 2.36, white count 13.0, Hb 16.4, hematocrit 47.5, platelets 197. Neck CT showed marked swelling of the right palatine tonsil with foci of hypoattenuation probably representing abscess/phlegmonous change.   The patient was admitted to the hospital working diagnosis of peritonsillar abscess.  1. Peritonsillar abscess, right. Patient was admitted to the medical floor, he was placed on IV fluids, antibiotic therapy with IV clindamycin, ENT was consulted. Patient underwent incision and drainage, findings reported as protruding right tonsil with 5 mL frank pus in the superior pole abscess. Patient had a improvement of his symptoms as well as his  leukocytosis. He had one culture bottle positive for coagulase negative staph which was deemed to be a contaminant. Patient will be discharged on oral clindamycin 300 mg 4 times a day for next 10 days.  2. Type 2 diabetes mellitus. Patient was placed on insulin sliding scale for glucose coverage and monitoring, his oral hypoglycemic agents were held. Capillary glucose 221, 199, 260 over last 24 hours. Patient will resume his diabetic regimen by the time of discharge including insulin glargine, 12 units daily, metformin thousand milligrams twice daily, and jardinace.   3. Hypertension. Patient's antihypertensive agents were held during this hospitalization to prevent hypotension, by the time of discharge he will resume his blood pressure agents, discharge systolic blood pressure 505, carvedilol and Micardis.  4. Acute kidney injury on chronic kidney disease stage III. Patient was placed on IV fluids, his kidney function was motorized closely, his discharge creatinine is 1.80, potassium 4.4. Peak potassium 5.3. Will resume patient's home regimen with furosemide and Aldactone, due to chronic kidney disease and hyperkalemia will hold on further potassium supplements  Discharge Diagnoses:  Principal Problem:   Peritonsillar abscess Active Problems:   Type 2 diabetes mellitus with renal manifestations (HCC)   Essential hypertension   Renal failure (ARF), acute on chronic [facility])    Discharge Instructions   Allergies as of 08/03/2016      Reactions   Amoxicillin Other (See Comments)   Reaction:  Hiccups  Has patient had a PCN reaction causing immediate rash, facial/tongue/throat swelling, SOB or lightheadedness with hypotension: No Has patient had a PCN reaction causing severe rash involving mucus membranes or skin necrosis: No Has patient had a PCN reaction that required hospitalization No Has patient had a PCN reaction occurring within the last 10 years:  No If all of the above answers are "NO",  then may proceed with Cephalosporin use.   Escitalopram Oxalate Other (See Comments)   Reaction:  Unknown    Ciprofloxacin Rash   Quinapril Hcl Rash   Sulfa Antibiotics Itching      Medication List    STOP taking these medications   potassium chloride SA 20 MEQ tablet Commonly known as:  K-DUR,KLOR-CON     TAKE these medications   acetaminophen 325 MG tablet Commonly known as:  TYLENOL Take 2 tablets (650 mg total) by mouth every 6 (six) hours as needed for mild pain (or Fever >/= 101). What changed:  medication strength  how much to take  reasons to take this   allopurinol 300 MG tablet Commonly known as:  ZYLOPRIM TAKE 1 TABLET BY MOUTH EVERY DAY   ALPRAZolam 0.5 MG tablet Commonly known as:  XANAX Take 1 tablet (0.5 mg total) by mouth daily as needed for sleep. 1/2 - 1 by mouth once daily as needed What changed:  how much to take  when to take this  additional instructions   aspirin EC 81 MG tablet Take 81 mg by mouth every other day.   carvedilol 6.25 MG tablet Commonly known as:  COREG Take 6.25 mg by mouth 2 (two) times daily with a meal.   chlorhexidine 0.12 % solution Commonly known as:  PERIDEX 15 mLs by Mouth Rinse route 2 (two) times daily.   clindamycin 300 MG capsule Commonly known as:  CLEOCIN Take 1 capsule (300 mg total) by mouth 4 (four) times daily.   cyclobenzaprine 10 MG tablet Commonly known as:  FLEXERIL Take 1 tablet (10 mg total) by mouth 3 (three) times daily as needed for muscle spasms.   diphenhydrAMINE 25 MG tablet Commonly known as:  BENADRYL Take 25 mg by mouth at bedtime as needed for sleep.   furosemide 40 MG tablet Commonly known as:  LASIX Take 40 mg by mouth daily.   guaiFENesin 600 MG 12 hr tablet Commonly known as:  MUCINEX Take 600 mg by mouth 2 (two) times daily as needed for cough or to loosen phlegm.   insulin glargine 100 unit/mL Sopn Commonly known as:  LANTUS Inject 12 Units into the skin at  bedtime.   JARDIANCE 25 MG Tabs tablet Generic drug:  empagliflozin Take 25 mg by mouth daily.   metformin 1000 MG (OSM) 24 hr tablet Commonly known as:  FORTAMET Take 1,000 mg by mouth 2 (two) times daily with a meal.   MICARDIS 80 MG tablet Generic drug:  telmisartan TAKE 1 TABLET BY MOUTH EVERY DAY What changed:  See the new instructions.   multivitamin with minerals Tabs tablet Take 1 tablet by mouth daily.   omeprazole 20 MG capsule Commonly known as:  PRILOSEC Take 20 mg by mouth daily.   spironolactone 25 MG tablet Commonly known as:  ALDACTONE Take 25 mg by mouth daily.   tadalafil 20 MG tablet Commonly known as:  CIALIS Take 20 mg by mouth daily as needed for erectile dysfunction.   traMADol 50 MG tablet Commonly known as:  ULTRAM Take 1 tablet (50 mg total) by mouth every 6 (six) hours as needed for pain. Maximum dose= 8 tablets per day   vitamin E 400 UNIT capsule Take 400 Units by mouth daily.      Follow-up Information    [name], MD. Schedule an appointment as soon as possible for a visit in 2 week(s).  Specialty:  Otolaryngology Contact information: [address] [place] [address] [phone]        Primary Care Follow up in 1 week(s).          Allergies  Allergen Reactions  . Amoxicillin Other (See Comments)    Reaction:  Hiccups  Has patient had a PCN reaction causing immediate rash, facial/tongue/throat swelling, SOB or lightheadedness with hypotension: No Has patient had a PCN reaction causing severe rash involving mucus membranes or skin necrosis: No Has patient had a PCN reaction that required hospitalization No Has patient had a PCN reaction occurring within the last 10 years: No If all of the above answers are "NO", then may proceed with Cephalosporin use.  . Escitalopram Oxalate Other (See Comments)    Reaction:  Unknown   . Ciprofloxacin Rash  . Quinapril Hcl Rash  . Sulfa Antibiotics Itching     Consultations:  ENT   Procedures/Studies: Ct Soft Tissue Neck Wo Contrast  Result Date: 07/31/2016 CLINICAL DATA:  66 y/o  M; sore throat and peritonsillar abscess. EXAM: CT NECK WITHOUT CONTRAST TECHNIQUE: Multidetector CT imaging of the neck was performed following the standard protocol without intravenous contrast. COMPARISON:  None. FINDINGS: Pharynx and larynx: Marked swelling of the right palatine tonsil and right greater than left or pharyngeal mucosal thickening. There are foci of hypoattenuation within the right palatine tonsil. Salivary glands: No inflammation, mass, or stone. Thyroid: Multiple thyroid nodules the largest on the left with coarse calcification measuring up to 25 mm. Lymph nodes: None enlarged or abnormal density. Vascular: Mild calcific atherosclerosis of the aortic arch and left carotid bifurcation. Limited intracranial: Negative. Visualized orbits: Negative. Mastoids and visualized paranasal sinuses: Clear. Skeleton: Moderate cervical spondylosis with discogenic degenerative changes greatest from the C4 through C7 levels where there is disc space narrowing and osteophytes. No high-grade bony canal stenosis or foraminal narrowing. Upper chest: 12 mm ground-glass nodule in the right lung apex (series 3, image 102). Other: None. IMPRESSION: 1. Marked swelling of the right palatine tonsil with foci of hypoattenuation probably representing abscess/phlegmonous change. Delineation of abscess is suboptimal in the absence of intravenous contrast. No extension into deep cervical compartments or retropharyngeal fluid collection. 2. Multiple thyroid nodules measuring up to 25 mm in the left lobe of thyroid. Further evaluation with thyroid ultrasound is recommended on a nonemergent basis. 3. 12 mm ground-glass nodule in the right lung apex. Initial follow-up with CT at 6-12 months is recommended to confirm persistence. If persistent, repeat CT is recommended every 2 years until 5 years  of stability has been established. This recommendation follows the consensus statement: Guidelines for Management of Incidental Pulmonary Nodules Detected on CT Images: From the Fleischner Society 2017; Radiology 2017; 284:228-243. 4. Moderate cervical spondylosis. Electronically Signed   By: Kristine Garbe M.D.   On: 07/31/2016 21:34      Subjective: Patient feeling better, pain is controlled with pain medications, no nausea or vomiting. No fever or chills.   Discharge Exam: Vitals:   08/03/16 0533 08/03/16 0900  BP: 117/85 (!) 137/93  Pulse: (!) 52 (!) 51  Resp: 20 20  Temp: 97.5 F (36.4 C) 97.4 F (36.3 C)   Vitals:   08/02/16 2005 08/03/16 0452 08/03/16 0533 08/03/16 0900  BP: 129/77  117/85 (!) 137/93  Pulse: 68  (!) 52 (!) 51  Resp: 18  20 20   Temp: 98.1 F (36.7 C)  97.5 F (36.4 C) 97.4 F (36.3 C)  TempSrc: Oral  Oral Oral  SpO2: 98%  98% 100%  Weight:  104.9 kg (231 lb 4.2 oz)    Height:        General: Pt is alert, awake, not in acute distress Cardiovascular: RRR, S1/S2 +, no rubs, no gallops Respiratory: CTA bilaterally, no wheezing, no rhonchi Abdominal: Soft, NT, ND, bowel sounds + Extremities: no edema, no cyanosis    The results of significant diagnostics from this hospitalization (including imaging, microbiology, ancillary and laboratory) are listed below for reference.     Microbiology: Recent Results (from the past 240 hour(s))  Blood culture (routine x 2)     Status: None (Preliminary result)   Collection Time: 07/31/16  4:36 PM  Result Value Ref Range Status   Specimen Description RIGHT ANTECUBITAL  Final   Special Requests BOTTLES DRAWN AEROBIC AND ANAEROBIC 5CC  Final   Culture   Final    NO GROWTH 2 DAYS Performed at [facility], [address], [place], [place] [address]    Report Status PENDING  Incomplete  Blood culture (routine x 2)     Status: Abnormal   Collection Time: 07/31/16  4:41 PM  Result Value Ref Range  Status   Specimen Description BLOOD LEFT HAND  Final   Special Requests IN PEDIATRIC BOTTLE 3CC  Final   Culture  Setup Time   Final    GRAM POSITIVE COCCI IN CLUSTERS AEROBIC BOTTLE ONLY Organism ID to follow CRITICAL RESULT CALLED TO, READ BACK BY AND VERIFIED WITH: D. Wofford Pharm.D. 16:00 08/01/16 ([email or web address])    Culture (A)  Final    STAPHYLOCOCCUS SPECIES (COAGULASE NEGATIVE) THE SIGNIFICANCE OF ISOLATING THIS ORGANISM FROM A SINGLE SET OF BLOOD CULTURES WHEN MULTIPLE SETS ARE DRAWN IS UNCERTAIN. PLEASE NOTIFY THE MICROBIOLOGY DEPARTMENT WITHIN ONE WEEK IF SPECIATION AND SENSITIVITIES ARE REQUIRED. Performed at [facility], [place] [address] [place], [place] [address]    Report Status 08/02/2016 FINAL  Final  Blood Culture ID Panel (Reflexed)     Status: Abnormal   Collection Time: 07/31/16  4:41 PM  Result Value Ref Range Status   Enterococcus species NOT DETECTED NOT DETECTED Final   Listeria monocytogenes NOT DETECTED NOT DETECTED Final   Staphylococcus species DETECTED (A) NOT DETECTED Final    Comment: Methicillin (oxacillin) resistant coagulase negative staphylococcus. Possible blood culture contaminant (unless isolated from more than one blood culture draw or clinical case suggests pathogenicity). No antibiotic treatment is indicated for blood  culture contaminants. CRITICAL RESULT CALLED TO, READ BACK BY AND VERIFIED WITH: D. Wofford Pharm.D. 16:00 08/01/16 ([email or web address])    Staphylococcus aureus NOT DETECTED NOT DETECTED Final   Methicillin resistance DETECTED (A) NOT DETECTED Final    Comment: CRITICAL RESULT CALLED TO, READ BACK BY AND VERIFIED WITH: D. Wofford Pharm.D. 16:00 08/01/16 ([email or web address])    Streptococcus species NOT DETECTED NOT DETECTED Final   Streptococcus agalactiae NOT DETECTED NOT DETECTED Final   Streptococcus pneumoniae NOT DETECTED NOT DETECTED Final   Streptococcus pyogenes NOT DETECTED NOT DETECTED Final   Acinetobacter baumannii NOT DETECTED NOT  DETECTED Final   Enterobacteriaceae species NOT DETECTED NOT DETECTED Final   Enterobacter cloacae complex NOT DETECTED NOT DETECTED Final   Escherichia coli NOT DETECTED NOT DETECTED Final   Klebsiella oxytoca NOT DETECTED NOT DETECTED Final   Klebsiella pneumoniae NOT DETECTED NOT DETECTED Final   Proteus species NOT DETECTED NOT DETECTED Final   Serratia marcescens NOT DETECTED NOT DETECTED Final   Haemophilus influenzae NOT DETECTED NOT  DETECTED Final   Neisseria meningitidis NOT DETECTED NOT DETECTED Final   Pseudomonas aeruginosa NOT DETECTED NOT DETECTED Final   Candida albicans NOT DETECTED NOT DETECTED Final   Candida glabrata NOT DETECTED NOT DETECTED Final   Candida krusei NOT DETECTED NOT DETECTED Final   Candida parapsilosis NOT DETECTED NOT DETECTED Final   Candida tropicalis NOT DETECTED NOT DETECTED Final    Comment: Performed at [facility], [place] [address], [place], [place] [address]  Surgical pcr screen     Status: None   Collection Time: 08/01/16 12:17 PM  Result Value Ref Range Status   MRSA, PCR NEGATIVE NEGATIVE Final   Staphylococcus aureus NEGATIVE NEGATIVE Final    Comment:        The Xpert SA Assay (FDA approved for NASAL specimens in patients over 33 years of age), is one component of a comprehensive surveillance program.  Test performance has been validated by [facility] for patients greater than or equal to 67 year old. It is not intended to diagnose infection nor to guide or monitor treatment.   Culture, blood (Routine X 2) w Reflex to ID Panel     Status: None (Preliminary result)   Collection Time: 08/01/16  5:20 PM  Result Value Ref Range Status   Specimen Description BLOOD RIGHT ANTECUBITAL  Final   Special Requests BOTTLES DRAWN AEROBIC AND ANAEROBIC 10CC  Final   Culture   Final    NO GROWTH < 24 HOURS Performed at [facility], [place] [address], [place], [place] [address]    Report Status PENDING  Incomplete  Culture, blood  (Routine X 2) w Reflex to ID Panel     Status: None (Preliminary result)   Collection Time: 08/01/16  5:26 PM  Result Value Ref Range Status   Specimen Description BLOOD RIGHT HAND  Final   Special Requests BOTTLES DRAWN AEROBIC AND ANAEROBIC 10CC  Final   Culture   Final    NO GROWTH < 24 HOURS Performed at [facility], [place] [address], [place], [place] [address]    Report Status PENDING  Incomplete     Labs: BNP (last 3 results) No results for input(s): BNP in the last 8760 hours. Basic Metabolic Panel:  Recent Labs Lab 07/31/16 1936 08/01/16 0306 08/02/16 0525 08/03/16 0511  NA 136 139 140 135  K 5.1 4.6 5.3* 4.4  CL 105 109 112* 108  CO2 20* 21* 21* 21*  GLUCOSE 253* 224* 190* 226*  BUN 56* 55* 40* 48*  CREATININE 2.36* 2.22* 1.70* 1.80*  CALCIUM 9.2 8.9 9.3 9.4   Liver Function Tests:  Recent Labs Lab 07/31/16 1936 08/01/16 0306  AST 25 21  ALT 33 31  ALKPHOS 108 109  BILITOT 1.4* 1.1  PROT 6.7 6.4*  ALBUMIN 3.1* 2.8*   No results for input(s): LIPASE, AMYLASE in the last 168 hours. No results for input(s): AMMONIA in the last 168 hours. CBC:  Recent Labs Lab 07/31/16 1641 08/01/16 0306 08/02/16 0525 08/03/16 0511  WBC 13.0* 11.7* 9.5 12.1*  NEUTROABS 10.1* 8.5* 8.3* 9.4*  HGB 16.4 14.6 13.7 13.3  HCT 47.5 43.9 41.9 40.5  MCV 91.9 93.4 94.4 92.9  PLT 197 141* 137* 151   Cardiac Enzymes: No results for input(s): CKTOTAL, CKMB, CKMBINDEX, TROPONINI in the last 168 hours. BNP: Invalid input(s): POCBNP CBG:  Recent Labs Lab 08/02/16 2002 08/02/16 2355 08/03/16 0438 08/03/16 0736 08/03/16 1135  GLUCAP 301* 324* 221* 199* 260*  D-Dimer No results for input(s): DDIMER in the last 72 hours. Hgb A1c No results for input(s): HGBA1C in the last 72 hours. Lipid Profile No results for input(s): CHOL, HDL, LDLCALC, TRIG, CHOLHDL, LDLDIRECT in the last 72 hours. Thyroid function studies No results for input(s): TSH, T4TOTAL, T3FREE,  THYROIDAB in the last 72 hours.  Invalid input(s): FREET3 Anemia work up No results for input(s): VITAMINB12, FOLATE, FERRITIN, TIBC, IRON, RETICCTPCT in the last 72 hours. Urinalysis    Component Value Date/Time   COLORURINE LT. YELLOW 04/01/2012 0822   APPEARANCEUR CLEAR 04/01/2012 0822   LABSPEC 1.010 04/01/2012 0822   PHURINE 5.5 04/01/2012 0822   GLUCOSEU NEGATIVE 04/01/2012 0822   HGBUR NEGATIVE 04/01/2012 0822   BILIRUBINUR NEGATIVE 04/01/2012 0822   KETONESUR NEGATIVE 04/01/2012 0822   UROBILINOGEN 0.2 04/01/2012 0822   NITRITE NEGATIVE 04/01/2012 0822   LEUKOCYTESUR SMALL 04/01/2012 0623   Sepsis Labs Invalid input(s): PROCALCITONIN,  WBC,  LACTICIDVEN Microbiology Recent Results (from the past 240 hour(s))  Blood culture (routine x 2)     Status: None (Preliminary result)   Collection Time: 07/31/16  4:36 PM  Result Value Ref Range Status   Specimen Description RIGHT ANTECUBITAL  Final   Special Requests BOTTLES DRAWN AEROBIC AND ANAEROBIC 5CC  Final   Culture   Final    NO GROWTH 2 DAYS Performed at [facility], [place] [address], [place], [place] [address]    Report Status PENDING  Incomplete  Blood culture (routine x 2)     Status: Abnormal   Collection Time: 07/31/16  4:41 PM  Result Value Ref Range Status   Specimen Description BLOOD LEFT HAND  Final   Special Requests IN PEDIATRIC BOTTLE 3CC  Final   Culture  Setup Time   Final    GRAM POSITIVE COCCI IN CLUSTERS AEROBIC BOTTLE ONLY Organism ID to follow CRITICAL RESULT CALLED TO, READ BACK BY AND VERIFIED WITH: D. Wofford Pharm.D. 16:00 08/01/16 ([email or web address])    Culture (A)  Final    STAPHYLOCOCCUS SPECIES (COAGULASE NEGATIVE) THE SIGNIFICANCE OF ISOLATING THIS ORGANISM FROM A SINGLE SET OF BLOOD CULTURES WHEN MULTIPLE SETS ARE DRAWN IS UNCERTAIN. PLEASE NOTIFY THE MICROBIOLOGY DEPARTMENT WITHIN ONE WEEK IF SPECIATION AND SENSITIVITIES ARE REQUIRED. Performed at [facility], [place] [address],  [place], [place] [address]    Report Status 08/02/2016 FINAL  Final  Blood Culture ID Panel (Reflexed)     Status: Abnormal   Collection Time: 07/31/16  4:41 PM  Result Value Ref Range Status   Enterococcus species NOT DETECTED NOT DETECTED Final   Listeria monocytogenes NOT DETECTED NOT DETECTED Final   Staphylococcus species DETECTED (A) NOT DETECTED Final    Comment: Methicillin (oxacillin) resistant coagulase negative staphylococcus. Possible blood culture contaminant (unless isolated from more than one blood culture draw or clinical case suggests pathogenicity). No antibiotic treatment is indicated for blood  culture contaminants. CRITICAL RESULT CALLED TO, READ BACK BY AND VERIFIED WITH: D. Wofford Pharm.D. 16:00 08/01/16 ([email or web address])    Staphylococcus aureus NOT DETECTED NOT DETECTED Final   Methicillin resistance DETECTED (A) NOT DETECTED Final    Comment: CRITICAL RESULT CALLED TO, READ BACK BY AND VERIFIED WITH: D. Wofford Pharm.D. 16:00 08/01/16 ([email or web address])    Streptococcus species NOT DETECTED NOT DETECTED Final   Streptococcus agalactiae NOT DETECTED NOT DETECTED Final   Streptococcus pneumoniae NOT DETECTED NOT DETECTED Final   Streptococcus pyogenes NOT DETECTED NOT DETECTED Final   Acinetobacter baumannii NOT DETECTED NOT  DETECTED Final   Enterobacteriaceae species NOT DETECTED NOT DETECTED Final   Enterobacter cloacae complex NOT DETECTED NOT DETECTED Final   Escherichia coli NOT DETECTED NOT DETECTED Final   Klebsiella oxytoca NOT DETECTED NOT DETECTED Final   Klebsiella pneumoniae NOT DETECTED NOT DETECTED Final   Proteus species NOT DETECTED NOT DETECTED Final   Serratia marcescens NOT DETECTED NOT DETECTED Final   Haemophilus influenzae NOT DETECTED NOT DETECTED Final   Neisseria meningitidis NOT DETECTED NOT DETECTED Final   Pseudomonas aeruginosa NOT DETECTED NOT DETECTED Final   Candida albicans NOT DETECTED NOT DETECTED Final   Candida glabrata NOT DETECTED NOT DETECTED  Final   Candida krusei NOT DETECTED NOT DETECTED Final   Candida parapsilosis NOT DETECTED NOT DETECTED Final   Candida tropicalis NOT DETECTED NOT DETECTED Final    Comment: Performed at [facility], [place] [address], [place], [place] [address]  Surgical pcr screen     Status: None   Collection Time: 08/01/16 12:17 PM  Result Value Ref Range Status   MRSA, PCR NEGATIVE NEGATIVE Final   Staphylococcus aureus NEGATIVE NEGATIVE Final    Comment:        The Xpert SA Assay (FDA approved for NASAL specimens in patients over 3 years of age), is one component of a comprehensive surveillance program.  Test performance has been validated by [facility] for patients greater than or equal to 19 year old. It is not intended to diagnose infection nor to guide or monitor treatment.   Culture, blood (Routine X 2) w Reflex to ID Panel     Status: None (Preliminary result)   Collection Time: 08/01/16  5:20 PM  Result Value Ref Range Status   Specimen Description BLOOD RIGHT ANTECUBITAL  Final   Special Requests BOTTLES DRAWN AEROBIC AND ANAEROBIC 10CC  Final   Culture   Final    NO GROWTH < 24 HOURS Performed at [facility], [place] [address], [place], [place] [address]    Report Status PENDING  Incomplete  Culture, blood (Routine X 2) w Reflex to ID Panel     Status: None (Preliminary result)   Collection Time: 08/01/16  5:26 PM  Result Value Ref Range Status   Specimen Description BLOOD RIGHT HAND  Final   Special Requests BOTTLES DRAWN AEROBIC AND ANAEROBIC 10CC  Final   Culture   Final    NO GROWTH < 24 HOURS Performed at [facility], [place] [address], [place], [place] [address]    Report Status PENDING  Incomplete     Time coordinating discharge: Over 45 minutes  SIGNED:   Tawni Millers, MD  [facility] 08/03/2016, 12:33 PM Pager   If 7PM-7AM, please contact night-coverage [email or web address] Password [email or web address]

## 2016-08-05 LAB — CULTURE, BLOOD (ROUTINE X 2): Culture: NO GROWTH

## 2016-08-06 LAB — CULTURE, BLOOD (ROUTINE X 2)
Culture: NO GROWTH
Culture: NO GROWTH

## 2016-08-09 ENCOUNTER — Other Ambulatory Visit: Payer: Self-pay | Admitting: Internal Medicine

## 2016-08-09 DIAGNOSIS — E041 Nontoxic single thyroid nodule: Secondary | ICD-10-CM

## 2016-08-17 ENCOUNTER — Ambulatory Visit
Admission: RE | Admit: 2016-08-17 | Discharge: 2016-08-17 | Disposition: A | Discharge status: Home / Self Care | Payer: Medicare Other | Source: Ambulatory Visit | Attending: Internal Medicine | Admitting: Internal Medicine

## 2016-08-17 DIAGNOSIS — E041 Nontoxic single thyroid nodule: Secondary | ICD-10-CM

## 2016-10-22 NOTE — Addendum Note (Signed)
Addendum  created 10/22/16 1222 by Myrtie Soman, MD   Sign clinical note

## 2016-12-10 ENCOUNTER — Other Ambulatory Visit: Payer: Self-pay | Admitting: Internal Medicine

## 2016-12-10 DIAGNOSIS — E042 Nontoxic multinodular goiter: Secondary | ICD-10-CM

## 2016-12-27 ENCOUNTER — Ambulatory Visit
Admission: RE | Admit: 2016-12-27 | Discharge: 2016-12-27 | Disposition: A | Discharge status: Home / Self Care | Payer: Medicare Other | Source: Ambulatory Visit | Attending: Internal Medicine | Admitting: Internal Medicine

## 2016-12-27 ENCOUNTER — Other Ambulatory Visit (HOSPITAL_COMMUNITY)
Admission: RE | Admit: 2016-12-27 | Discharge: 2016-12-27 | Disposition: A | Discharge status: Home / Self Care | Payer: Medicare Other | Source: Ambulatory Visit | Attending: Radiology | Admitting: Radiology

## 2016-12-27 DIAGNOSIS — E042 Nontoxic multinodular goiter: Secondary | ICD-10-CM

## 2017-01-16 ENCOUNTER — Encounter (HOSPITAL_COMMUNITY): Payer: Self-pay

## 2017-02-01 ENCOUNTER — Emergency Department (HOSPITAL_BASED_OUTPATIENT_CLINIC_OR_DEPARTMENT_OTHER)
Admit: 2017-02-01 | Discharge: 2017-02-01 | Disposition: A | Discharge status: Home / Self Care | Payer: Medicare Other | Attending: Emergency Medicine | Admitting: Emergency Medicine

## 2017-02-01 ENCOUNTER — Encounter (HOSPITAL_COMMUNITY): Payer: Self-pay | Admitting: Emergency Medicine

## 2017-02-01 ENCOUNTER — Emergency Department (HOSPITAL_COMMUNITY)
Admission: EM | Admit: 2017-02-01 | Discharge: 2017-02-01 | Disposition: A | Discharge status: Home / Self Care | Payer: Medicare Other | Attending: Emergency Medicine | Admitting: Emergency Medicine

## 2017-02-01 DIAGNOSIS — Z79899 Other long term (current) drug therapy: Secondary | ICD-10-CM | POA: Insufficient documentation

## 2017-02-01 DIAGNOSIS — E1122 Type 2 diabetes mellitus with diabetic chronic kidney disease: Secondary | ICD-10-CM | POA: Insufficient documentation

## 2017-02-01 DIAGNOSIS — Z7982 Long term (current) use of aspirin: Secondary | ICD-10-CM | POA: Insufficient documentation

## 2017-02-01 DIAGNOSIS — M7989 Other specified soft tissue disorders: Secondary | ICD-10-CM

## 2017-02-01 DIAGNOSIS — M79604 Pain in right leg: Secondary | ICD-10-CM | POA: Insufficient documentation

## 2017-02-01 DIAGNOSIS — I129 Hypertensive chronic kidney disease with stage 1 through stage 4 chronic kidney disease, or unspecified chronic kidney disease: Secondary | ICD-10-CM | POA: Insufficient documentation

## 2017-02-01 DIAGNOSIS — N182 Chronic kidney disease, stage 2 (mild): Secondary | ICD-10-CM | POA: Insufficient documentation

## 2017-02-01 DIAGNOSIS — Z794 Long term (current) use of insulin: Secondary | ICD-10-CM | POA: Insufficient documentation

## 2017-02-01 HISTORY — DX: Disorder of thyroid, unspecified: E07.9

## 2017-02-01 LAB — I-STAT CHEM 8, ED
BUN: 33 mg/dL — ABNORMAL HIGH (ref 6–20)
Calcium, Ion: 1.19 mmol/L (ref 1.15–1.40)
Chloride: 102 mmol/L (ref 101–111)
Creatinine, Ser: 1.6 mg/dL — ABNORMAL HIGH (ref 0.61–1.24)
Glucose, Bld: 335 mg/dL — ABNORMAL HIGH (ref 65–99)
HCT: 50 % (ref 39.0–52.0)
Hemoglobin: 17 g/dL (ref 13.0–17.0)
Potassium: 4.8 mmol/L (ref 3.5–5.1)
Sodium: 137 mmol/L (ref 135–145)
TCO2: 24 mmol/L (ref 22–32)

## 2017-02-01 MED ORDER — METHOCARBAMOL 500 MG PO TABS
500.0000 mg | ORAL_TABLET | Freq: Once | ORAL | Status: AC
  Administered 2017-02-01: 500 mg via ORAL
  Filled 2017-02-01: qty 1

## 2017-02-01 MED ORDER — METHOCARBAMOL 500 MG PO TABS: 500.0000 mg | ORAL_TABLET | Freq: Two times a day (BID) | ORAL | 0 refills | Status: DC | PRN

## 2017-02-01 NOTE — ED Notes (Signed)
Pt A/Ox4, ambulatory. Pt verbalizes understanding of d/c instructions, prescriptions, and follow up care. Pt wheeled out in a wheel chair.

## 2017-02-01 NOTE — ED Notes (Signed)
US at bedside

## 2017-02-01 NOTE — ED Provider Notes (Signed)
[facility] DEPT Provider Note   CSN: [id number] Arrival date & time: 02/01/17  1047     History   Chief Complaint Chief Complaint  Patient presents with  . Leg Pain    HPI Nathan Medina is a 66 y.o. male.  The history is provided by the patient and medical records. No language interpreter was used.  Leg Pain     Nathan Medina is a 66 y.o. male  with a PMH of DM, HTN, HLD who presents to the Emergency Department complaining of acute onset of right calf and ankle pain which he describes as a cramping sensation. This began this morning when he started walking. Pain will radiate up leg with ambulation. No medications taken prior to arrival for symptoms. He called PCP who recommended he come to ER to rule out DVT. No recent travel/surgeries/immobilizations.No history of PE/DVT. Takes baby ASA daily, no other blood thinners.   Past Medical History:  Diagnosis Date  . Abnormal liver function test 05/23/2011  . ALLERGIC RHINITIS   . ANXIETY   . Arthritis   . Chronic kidney disease    CKD stage 2 per office visit note 08/01/12 on chart   . COLONIC POLYPS, HX OF   . Complication of anesthesia    slow to wake up   . DEPRESSION   . DIABETES MELLITUS, TYPE II   . GERD   . GOUT   . HYPERLIPIDEMIA   . HYPERTENSION   . IBS   . 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 Left renal mass 05/23/2011  . Lumbar radicular pain 06/03/2011  . Shortness of breath    occasional shortness of breath   . Thyroid disease     Patient Active Problem List   Diagnosis Date Noted  . Peritonsillar abscess 08/01/2016  . Renal failure (ARF), acute on chronic (HCC) 08/01/2016  . Tonsil, abscess 07/31/2016  . Back pain 04/03/2012  . Ventral hernia 10/22/2011  . Lumbar radicular pain 06/03/2011  . PSA elevation 06/01/2011  . Left renal mass 05/23/2011  . Abnormal liver function test 05/23/2011  . Rash 04/02/2011    . Hearing loss 11/28/2010  . Preventative health care 09/17/2010  . 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 PERIPHERAL EDEMA 11/04/2009  . Type 2 diabetes mellitus with renal manifestations ([place]) 06/25/2007  . HYPERLIPIDEMIA 06/25/2007  . GOUT 06/25/2007  . ANXIETY 06/25/2007  . DEPRESSION 06/25/2007  . Essential hypertension 06/25/2007  . ALLERGIC RHINITIS 06/25/2007  . GERD 06/25/2007  . IBS 06/25/2007  . COLONIC POLYPS, HX OF 06/25/2007    Past Surgical History:  Procedure Laterality Date  . APPENDECTOMY  02/12/11  . COLON SURGERY  08/11/10   sigmoid colectomy  . COLON SURGERY  02/12/11   colostomy takedown  . HERNIA REPAIR    . INSERTION OF MESH N/A 08/20/2012   Procedure: INSERTION OF MESH;  Surgeon: [name], MD;  Location: [facility];  Service: General;  Laterality: N/A;  . LYSIS OF ADHESION  08/20/2012   Procedure: LYSIS OF ADHESION;  Surgeon: [name], MD;  Location: [facility];  Service: General;;  . SEPTOPLASTY    . TONSILLECTOMY Right 08/01/2016   Procedure: INCISION AND DRAINAGE RIGHT PERI-TONSILLAR ABSCESS;  Surgeon: [name], MD;  Location: [facility];  Service: ENT;  Laterality: Right;  . VENTRAL HERNIA REPAIR  08/20/2012   Procedure: HERNIA REPAIR VENTRAL ADULT;  Surgeon: Luella Cook  III, MD;  Location: [facility];  Service: General;;       Home Medications    Prior to Admission medications   Medication Sig Start Date End Date Taking? Authorizing Provider  acetaminophen (TYLENOL) 325 MG tablet Take 2 tablets (650 mg total) by mouth every 6 (six) hours as needed for mild pain (or Fever >/= 101). 08/03/16  Yes Arrien, Jimmy Picket, MD  allopurinol (ZYLOPRIM) 300 MG tablet TAKE 1 TABLET BY MOUTH EVERY DAY 05/16/13  Yes Biagio Borg, MD  ALPRAZolam Duanne Moron) 0.5 MG tablet Take 1 tablet (0.5 mg total) by mouth daily as needed for sleep. 1/2 - 1 by mouth once daily as needed Patient taking differently: Take 0.25-0.5 mg by mouth at bedtime as needed for sleep.   04/03/12  Yes Biagio Borg, MD  aspirin EC 81 MG tablet Take 81 mg by mouth every other day.   Yes [provider]  carvedilol (COREG) 6.25 MG tablet Take 6.25 mg by mouth 2 (two) times daily with a meal.   Yes [provider]  diphenhydrAMINE (BENADRYL) 25 MG tablet Take 25 mg by mouth at bedtime as needed for sleep.   Yes [provider]  empagliflozin (JARDIANCE) 25 MG TABS tablet Take 25 mg by mouth daily.   Yes [provider]  Ergocalciferol (VITAMIN D2) 400 units TABS Take 1 tablet by mouth daily.   Yes [provider]  furosemide (LASIX) 40 MG tablet Take 40 mg by mouth daily.   Yes [provider]  guaiFENesin (MUCINEX) 600 MG 12 hr tablet Take 600 mg by mouth 2 (two) times daily as needed for cough or to loosen phlegm.   Yes [provider]  insulin glargine (LANTUS) 100 unit/mL SOPN Inject 16 Units into the skin at bedtime.    Yes [provider]  metformin (FORTAMET) 1000 MG (OSM) 24 hr tablet Take 1,000 mg by mouth 2 (two) times daily with a meal.   Yes [provider]  Multiple Vitamin (MULTIVITAMIN WITH MINERALS) TABS tablet Take 1 tablet by mouth daily.   Yes [provider]  omega-3 acid ethyl esters (LOVAZA) 1 g capsule Take 1 g by mouth 2 (two) times daily.   Yes [provider]  omeprazole (PRILOSEC) 20 MG capsule Take 20 mg by mouth daily.   Yes [provider]  tadalafil (CIALIS) 20 MG tablet Take 20 mg by mouth daily as needed for erectile dysfunction.    Yes [provider]  vitamin E 400 UNIT capsule Take 400 Units by mouth 2 (two) times daily.    Yes [provider]  chlorhexidine (PERIDEX) 0.12 % solution 15 mLs by Mouth Rinse route 2 (two) times daily. Patient not taking: Reported on 02/01/2017 08/03/16   Arrien, Jimmy Picket, MD  methocarbamol (ROBAXIN) 500 MG tablet Take 1 tablet (500 mg total) by mouth 2 (two) times daily as needed for muscle  spasms. 02/01/17   Shaneal Barasch, Ozella Almond, PA-C  MICARDIS 80 MG tablet TAKE 1 TABLET BY MOUTH EVERY DAY Patient not taking: Reported on 02/01/2017 04/29/12   Biagio Borg, MD    Family History Family History  Problem Relation Age of Onset  . Hypertension Paternal Aunt   . Stroke Maternal Grandmother   . Hypertension Maternal Grandmother     Social History Social History  Substance Use Topics  . Smoking status: Never Smoker  . Smokeless tobacco: Never Used  . Alcohol use Yes     Comment:  occasional      Allergies   Amoxicillin; Escitalopram oxalate; Ciprofloxacin; Quinapril hcl; and Sulfa antibiotics   Review of Systems Review of Systems  Musculoskeletal: Positive for myalgias. Negative for back pain.  All other systems reviewed and are negative.    Physical Exam Updated Vital Signs BP (!) 149/94 (BP Location: Left Arm)   Pulse 67   Temp 98.3 F (36.8 C) (Oral)   Resp 14   SpO2 100%   Physical Exam  Constitutional: He is oriented to person, place, and time. He appears well-developed and well-nourished. No distress.  HENT:  Head: Normocephalic and atraumatic.  Cardiovascular: Normal rate, regular rhythm and normal heart sounds.   No murmur heard. Pulmonary/Chest: Effort normal and breath sounds normal. No respiratory distress.  Abdominal: Soft. He exhibits no distension. There is no tenderness.  Musculoskeletal: Normal range of motion.  Tenderness to palpation of right calf and lateral lower leg. Full ROM. 2+ DP. Sensation intact. Full ROM. All compartments soft. No overlying skin changes.  Neurological: He is alert and oriented to person, place, and time.  Skin: Skin is warm and dry.  Nursing note and vitals reviewed.    ED Treatments / Results  Labs (all labs ordered are listed, but only abnormal results are displayed) Labs Reviewed  I-STAT CHEM 8, ED - Abnormal; Notable for the following:       Result Value   BUN 33 (*)    Creatinine, Ser 1.60 (*)     Glucose, Bld 335 (*)    All other components within normal limits    EKG  EKG Interpretation None       Radiology No results found.  Procedures Procedures (including critical care time)  Medications Ordered in ED Medications  methocarbamol (ROBAXIN) tablet 500 mg (500 mg Oral Given 02/01/17 1306)     Initial Impression / Assessment and Plan / ED Course  I have reviewed the triage vital signs and the nursing notes.  Pertinent labs & imaging results that were available during my care of the patient were reviewed by me and considered in my medical decision making (see chart for details).    Nathan Medina is a 66 y.o. male who presents to ED for right lower extremity pain described as cramping which began today. On exam, RLE is NVI. He is tender to the calf. Symptoms seem c/w muscle cramp, however will obtain U/S to rule out DVT.   DVT study negative. Elytes wdl. Glucose elevated - patient with hx of DM and notes this is where his sugars typically run. Discussed importance of strict glucose control and PCP follow up. Discussed home care and return precautions. All questions answered.    Patient seen by and discussed with Dr. Johnney Killian who agrees with treatment plan.    Final Clinical Impressions(s) / ED Diagnoses   Final diagnoses:  Pain of right lower extremity    New Prescriptions New Prescriptions   METHOCARBAMOL (ROBAXIN) 500 MG TABLET    Take 1 tablet (500 mg total) by mouth 2 (two) times daily as needed for muscle spasms.     Jadriel Saxer, Ozella Almond, PA-C 02/01/17 [place], MD 02/05/17 [phone]

## 2017-02-01 NOTE — Discharge Instructions (Signed)
It was my pleasure taking care of you today!   Please follow up with your primary care provider.   Return to ER for new or worsening symptoms, any additional concerns.

## 2017-02-01 NOTE — ED Notes (Signed)
Ultrasound at bedside

## 2017-02-01 NOTE — Progress Notes (Signed)
*  PRELIMINARY RESULTS* Vascular Ultrasound Right lower extremity venous duplex has been completed.  Preliminary findings: No evidence of DVT or baker's cyst.   Landry Mellow, RDMS, RVT  02/01/2017, 12:13 PM

## 2017-02-01 NOTE — ED Triage Notes (Signed)
Pt reports this am he started having R lower leg swelling. Pain begins in ankle and radiates up leg with bearing weight. No known injury. Was told to come to ED by PCP to rule out DVT.

## 2017-02-01 NOTE — ED Provider Notes (Addendum)
Medical screening examination/treatment/procedure(s) were conducted as a shared visit with non-physician practitioner(s) and myself.  I personally evaluated the patient during the encounter.   EKG Interpretation None        Nathan Shanks, MD 02/01/17 1346 He developed acute onset of ankle pain. It was cramping and radiating up his leg. He was referred to the emergency department for concern of DVT. Patient is alert and nontoxic. Lower extremity on the right is normal without peripheral edema. Achilles intact and without pain to compression. Patient neurovascularly intact. DVT study negative. I agree with plan of management.   Nathan Shanks, MD 02/05/17 [phone]

## 2017-02-01 NOTE — ED Notes (Signed)
PA at bedside.

## 2017-03-19 ENCOUNTER — Ambulatory Visit: Payer: Self-pay | Admitting: Surgery

## 2017-07-25 ENCOUNTER — Other Ambulatory Visit: Payer: Self-pay | Admitting: Internal Medicine

## 2017-07-25 DIAGNOSIS — R911 Solitary pulmonary nodule: Secondary | ICD-10-CM

## 2017-08-06 ENCOUNTER — Ambulatory Visit
Admission: RE | Admit: 2017-08-06 | Discharge: 2017-08-06 | Disposition: A | Discharge status: Home / Self Care | Payer: Medicare Other | Source: Ambulatory Visit | Attending: Internal Medicine | Admitting: Internal Medicine

## 2017-08-06 DIAGNOSIS — R911 Solitary pulmonary nodule: Secondary | ICD-10-CM

## 2017-08-14 ENCOUNTER — Other Ambulatory Visit (HOSPITAL_COMMUNITY): Payer: Self-pay | Admitting: Pulmonary Disease

## 2017-08-14 DIAGNOSIS — R918 Other nonspecific abnormal finding of lung field: Secondary | ICD-10-CM

## 2017-08-26 ENCOUNTER — Ambulatory Visit (HOSPITAL_COMMUNITY)
Admission: RE | Admit: 2017-08-26 | Discharge: 2017-08-26 | Disposition: A | Discharge status: Home / Self Care | Payer: Medicare Other | Source: Ambulatory Visit | Attending: Pulmonary Disease | Admitting: Pulmonary Disease

## 2017-08-26 DIAGNOSIS — R918 Other nonspecific abnormal finding of lung field: Secondary | ICD-10-CM

## 2017-08-26 DIAGNOSIS — R911 Solitary pulmonary nodule: Secondary | ICD-10-CM | POA: Diagnosis not present

## 2017-08-26 DIAGNOSIS — I700 Atherosclerosis of aorta: Secondary | ICD-10-CM | POA: Insufficient documentation

## 2017-08-26 LAB — GLUCOSE, CAPILLARY: Glucose-Capillary: 172 mg/dL — ABNORMAL HIGH (ref 65–99)

## 2017-08-26 MED ORDER — FLUDEOXYGLUCOSE F - 18 (FDG) INJECTION
9.9000 | Freq: Once | INTRAVENOUS | Status: AC | PRN
  Administered 2017-08-26: 9.9 via INTRAVENOUS

## 2017-08-28 ENCOUNTER — Institutional Professional Consult (permissible substitution): Payer: Medicare Other | Admitting: Pulmonary Disease

## 2017-08-28 ENCOUNTER — Other Ambulatory Visit: Payer: Self-pay | Admitting: Urology

## 2017-08-28 DIAGNOSIS — D4102 Neoplasm of uncertain behavior of left kidney: Secondary | ICD-10-CM

## 2017-09-04 ENCOUNTER — Ambulatory Visit (HOSPITAL_COMMUNITY)
Admission: RE | Admit: 2017-09-04 | Discharge: 2017-09-04 | Disposition: A | Discharge status: Home / Self Care | Payer: Medicare Other | Source: Ambulatory Visit | Attending: Urology | Admitting: Urology

## 2017-09-04 DIAGNOSIS — D4102 Neoplasm of uncertain behavior of left kidney: Secondary | ICD-10-CM | POA: Diagnosis present

## 2017-09-04 DIAGNOSIS — N281 Cyst of kidney, acquired: Secondary | ICD-10-CM | POA: Diagnosis not present

## 2017-09-04 LAB — POCT I-STAT CREATININE: Creatinine, Ser: 1.6 mg/dL — ABNORMAL HIGH (ref 0.61–1.24)

## 2017-09-04 MED ORDER — GADOBENATE DIMEGLUMINE 529 MG/ML IV SOLN
20.0000 mL | Freq: Once | INTRAVENOUS | Status: AC | PRN
  Administered 2017-09-04: 20 mL via INTRAVENOUS

## 2017-09-20 ENCOUNTER — Ambulatory Visit: Payer: Self-pay | Admitting: Surgery

## 2017-10-09 ENCOUNTER — Encounter (HOSPITAL_COMMUNITY): Payer: Self-pay

## 2017-10-09 NOTE — Pre-Procedure Instructions (Signed)
Last office visit note 08/08/2017 from Dr. Buddy Duty in chart.

## 2017-10-09 NOTE — Patient Instructions (Signed)
Your procedure is scheduled on: Thursday, May, 30, 2019   Surgery Time:  7:30-9:30AM   Report to [facility]  Entrance    Report to admitting at 5:30 AM   Call this number if you have problems the morning of surgery [phone]   Do not eat food or drink liquids :After Midnight.   Do NOT smoke after Midnight   Take these medicines the morning of surgery with A SIP OF WATER: Allopurinol, Carvedilol, Omeprazole   Take 1/2 (Half) dose of Lantus the night before surgery   DO NOT TAKE ANY DIABETIC MEDICATIONS DAY OF YOUR SURGERY                               You may not have any metal on your body including jewelry, and body piercings             Do not wear lotions, powders, perfumes/cologne, or deodorant                          Men may shave face and neck.   Do not bring valuables to the hospital. [place].   Contacts, dentures or bridgework may not be worn into surgery.   Leave suitcase in the car. After surgery it may be brought to your room.   Special Instructions: Bring a copy of your healthcare power of attorney and living will documents         the day of surgery if you haven't scanned them in before.              Please read over the following fact sheets you were given:  [facility] - Preparing for Surgery Before surgery, you can play an important role.  Because skin is not sterile, your skin needs to be as free of germs as possible.  You can reduce the number of germs on your skin by washing with CHG (chlorahexidine gluconate) soap before surgery.  CHG is an antiseptic cleaner which kills germs and bonds with the skin to continue killing germs even after washing. Please DO NOT use if you have an allergy to CHG or antibacterial soaps.  If your skin becomes reddened/irritated stop using the CHG and inform your nurse when you arrive at Short Stay. Do not shave (including legs and underarms) for at least  48 hours prior to the first CHG shower.  You may shave your face/neck.  Please follow these instructions carefully:  1.  Shower with CHG Soap the night before surgery and the  morning of surgery.  2.  If you choose to wash your hair, wash your hair first as usual with your normal  shampoo.  3.  After you shampoo, rinse your hair and body thoroughly to remove the shampoo.                             4.  Use CHG as you would any other liquid soap.  You can apply chg directly to the skin and wash.  Gently with a scrungie or clean washcloth.  5.  Apply the CHG Soap to your body ONLY FROM THE NECK DOWN.   Do   not use on face/ open  Wound or open sores. Avoid contact with eyes, ears mouth and   genitals (private parts).                       Wash face,  Genitals (private parts) with your normal soap.             6.  Wash thoroughly, paying special attention to the area where your    surgery  will be performed.  7.  Thoroughly rinse your body with warm water from the neck down.  8.  DO NOT shower/wash with your normal soap after using and rinsing off the CHG Soap.                9.  Pat yourself dry with a clean towel.            10.  Wear clean pajamas.            11.  Place clean sheets on your bed the night of your first shower and do not  sleep with pets. Day of Surgery : Do not apply any lotions/deodorants the morning of surgery.  Please wear clean clothes to the hospital/surgery center.  FAILURE TO FOLLOW THESE INSTRUCTIONS MAY RESULT IN THE CANCELLATION OF YOUR SURGERY  PATIENT SIGNATURE_________________________________  NURSE SIGNATURE__________________________________  ________________________________________________________________________ How to Manage Your Diabetes Before and After Surgery  Why is it important to control my blood sugar before and after surgery? . Improving blood sugar levels before and after surgery helps healing and can limit  problems. . A way of improving blood sugar control is eating a healthy diet by: o  Eating less sugar and carbohydrates o  Increasing activity/exercise o  Talking with your doctor about reaching your blood sugar goals . High blood sugars (greater than 180 mg/dL) can raise your risk of infections and slow your recovery, so you will need to focus on controlling your diabetes during the weeks before surgery. . Make sure that the doctor who takes care of your diabetes knows about your planned surgery including the date and location.  How do I manage my blood sugar before surgery? . Check your blood sugar at least 4 times a day, starting 2 days before surgery, to make sure that the level is not too high or low. o Check your blood sugar the morning of your surgery when you wake up and every 2 hours until you get to the Short Stay unit. . If your blood sugar is less than 70 mg/dL, you will need to treat for low blood sugar: o Do not take insulin. o Treat a low blood sugar (less than 70 mg/dL) with  cup of clear juice (cranberry or apple), 4 glucose tablets, OR glucose gel. o Recheck blood sugar in 15 minutes after treatment (to make sure it is greater than 70 mg/dL). If your blood sugar is not greater than 70 mg/dL on recheck, call [phone] for further instructions. . Report your blood sugar to the short stay nurse when you get to Short Stay.  . If you are admitted to the hospital after surgery: o Your blood sugar will be checked by the staff and you will probably be given insulin after surgery (instead of oral diabetes medicines) to make sure you have good blood sugar levels. o The goal for blood sugar control after surgery is 80-180 mg/dL.   WHAT DO I DO ABOUT MY DIABETES MEDICATION?  [name] Do not take oral diabetes medicines (pills) the morning  of surgery.  . THE NIGHT BEFORE SURGERY, take     units of       insulin.       . THE MORNING OF SURGERY, take   units of         insulin.  . The  day of surgery, do not take other diabetes injectables, including Byetta (exenatide), Bydureon (exenatide ER), Victoza (liraglutide), or Trulicity (dulaglutide).  . If your CBG is greater than 220 mg/dL, you may take  of your sliding scale  . (correction) dose of insulin.    For patients with insulin pumps: Contact your diabetes doctor for specific instructions before surgery. Decrease basal rates by 20% at midnight the night before your surgery. Note that if your surgery is planned to be longer than 2 hours, your insulin pump will be removed and intravenous (IV) insulin will be started and managed by the nurses and the anesthesiologist. You will be able to restart your insulin pump once you are awake and able to manage it.  Make sure to bring insulin pump supplies to the hospital with you in case the  site needs to be changed.  Patient Signature:  Date:   Nurse Signature:  Date:   Reviewed and Endorsed by [facility] Patient Education Committee, August 2015

## 2017-10-10 ENCOUNTER — Other Ambulatory Visit: Payer: Self-pay

## 2017-10-10 ENCOUNTER — Encounter (HOSPITAL_COMMUNITY): Payer: Self-pay

## 2017-10-10 ENCOUNTER — Encounter (HOSPITAL_COMMUNITY)
Admission: RE | Admit: 2017-10-10 | Discharge: 2017-10-10 | Disposition: A | Discharge status: Home / Self Care | Payer: Medicare Other | Source: Ambulatory Visit | Attending: Surgery | Admitting: Surgery

## 2017-10-10 DIAGNOSIS — F329 Major depressive disorder, single episode, unspecified: Secondary | ICD-10-CM | POA: Diagnosis not present

## 2017-10-10 DIAGNOSIS — Z794 Long term (current) use of insulin: Secondary | ICD-10-CM | POA: Insufficient documentation

## 2017-10-10 DIAGNOSIS — D497 Neoplasm of unspecified behavior of endocrine glands and other parts of nervous system: Secondary | ICD-10-CM | POA: Insufficient documentation

## 2017-10-10 DIAGNOSIS — F419 Anxiety disorder, unspecified: Secondary | ICD-10-CM | POA: Insufficient documentation

## 2017-10-10 DIAGNOSIS — Z01810 Encounter for preprocedural cardiovascular examination: Secondary | ICD-10-CM | POA: Diagnosis not present

## 2017-10-10 DIAGNOSIS — Z01812 Encounter for preprocedural laboratory examination: Secondary | ICD-10-CM | POA: Diagnosis not present

## 2017-10-10 DIAGNOSIS — R9431 Abnormal electrocardiogram [ECG] [EKG]: Secondary | ICD-10-CM | POA: Insufficient documentation

## 2017-10-10 DIAGNOSIS — I10 Essential (primary) hypertension: Secondary | ICD-10-CM | POA: Diagnosis not present

## 2017-10-10 DIAGNOSIS — E669 Obesity, unspecified: Secondary | ICD-10-CM | POA: Insufficient documentation

## 2017-10-10 DIAGNOSIS — R001 Bradycardia, unspecified: Secondary | ICD-10-CM | POA: Diagnosis not present

## 2017-10-10 DIAGNOSIS — E119 Type 2 diabetes mellitus without complications: Secondary | ICD-10-CM | POA: Diagnosis not present

## 2017-10-10 HISTORY — DX: Displaced fracture of neck of unspecified radius, initial encounter for closed fracture: S52.133A

## 2017-10-10 HISTORY — DX: Solitary pulmonary nodule: R91.1

## 2017-10-10 HISTORY — DX: Spondylolysis, cervical region: M43.02

## 2017-10-10 HISTORY — DX: Diverticulitis of intestine, part unspecified, without perforation or abscess without bleeding: K57.92

## 2017-10-10 HISTORY — DX: Cyst of kidney, acquired: N28.1

## 2017-10-10 HISTORY — DX: Dizziness and giddiness: R42

## 2017-10-10 HISTORY — DX: Pseudocyst of pancreas: K86.3

## 2017-10-10 HISTORY — DX: Nontoxic multinodular goiter: E04.2

## 2017-10-10 HISTORY — DX: Peritonsillar abscess: J36

## 2017-10-10 HISTORY — DX: Fatty (change of) liver, not elsewhere classified: K76.0

## 2017-10-10 HISTORY — DX: Nontoxic single thyroid nodule: E04.1

## 2017-10-10 HISTORY — DX: Personal history of other diseases of the digestive system: Z87.19

## 2017-10-10 HISTORY — DX: Atherosclerosis of aorta: I70.0

## 2017-10-10 LAB — BASIC METABOLIC PANEL
Anion gap: 12 (ref 5–15)
BUN: 35 mg/dL — ABNORMAL HIGH (ref 6–20)
CO2: 26 mmol/L (ref 22–32)
Calcium: 9.4 mg/dL (ref 8.9–10.3)
Chloride: 102 mmol/L (ref 101–111)
Creatinine, Ser: 1.58 mg/dL — ABNORMAL HIGH (ref 0.61–1.24)
GFR calc Af Amer: 51 mL/min — ABNORMAL LOW (ref >60)
GFR calc non Af Amer: 44 mL/min — ABNORMAL LOW (ref >60)
Glucose, Bld: 122 mg/dL — ABNORMAL HIGH (ref 65–99)
Potassium: 3.9 mmol/L (ref 3.5–5.1)
Sodium: 140 mmol/L (ref 135–145)

## 2017-10-10 LAB — CBC
HCT: 50.8 % (ref 39.0–52.0)
Hemoglobin: 16.7 g/dL (ref 13.0–17.0)
MCH: 29.8 pg (ref 26.0–34.0)
MCHC: 32.9 g/dL (ref 30.0–36.0)
MCV: 90.7 fL (ref 78.0–100.0)
Platelets: 122 10*3/uL — ABNORMAL LOW (ref 150–400)
RBC: 5.6 MIL/uL (ref 4.22–5.81)
RDW: 14.4 % (ref 11.5–15.5)
WBC: 6.9 10*3/uL (ref 4.0–10.5)

## 2017-10-10 LAB — HEMOGLOBIN A1C
Hgb A1c MFr Bld: 6.6 % — ABNORMAL HIGH (ref 4.8–5.6)
Mean Plasma Glucose: 142.72 mg/dL

## 2017-10-10 LAB — GLUCOSE, CAPILLARY: Glucose-Capillary: 141 mg/dL — ABNORMAL HIGH (ref 65–99)

## 2017-10-10 NOTE — Pre-Procedure Instructions (Signed)
CBC/BMP results 10/10/2017 faxed to Dr. Harlow Asa via epic

## 2017-10-11 NOTE — Pre-Procedure Instructions (Signed)
EKG, CBC and BMP results 10/10/2017 reviewed by Dr. Wilfrid Lund no neworders received at this time.

## 2017-10-16 ENCOUNTER — Encounter (HOSPITAL_COMMUNITY): Payer: Self-pay | Admitting: Surgery

## 2017-10-16 DIAGNOSIS — D440 Neoplasm of uncertain behavior of thyroid gland: Secondary | ICD-10-CM | POA: Diagnosis present

## 2017-10-16 DIAGNOSIS — E042 Nontoxic multinodular goiter: Secondary | ICD-10-CM | POA: Diagnosis present

## 2017-10-16 NOTE — H&P (Signed)
General Surgery [facility]  Nathan Medina DOB: [birth year] Single / Language: Cleophus Molt / Race: White Male   History of Present Illness   The patient is a 67 year old male who presents with a thyroid nodule.  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 This has previously been biopsied. Testing includes molecular genetic analysis which was suspicious, giving a 50% risk of malignancy. Patient has been delayed in seeking definitive treatment due to other medical concerns including a left pulmonary nodule which is being followed by his pulmonologist at [facility] and a cystic neoplasm of the kidney which is being evaluated by his urologist. Patient would now like to schedule thyroid surgery in the near future.   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 Excedrin Extra Strength (250-250-65MG  Tablet, Oral) Active. Furosemide (40MG  Tablet, Oral) Active. GuaiFENesin ER (1200MG  Tablet ER 12HR, Oral) Active. Ibuprofen (300MG  Tablet, Oral) Active. Lovaza (1GM Capsule, Oral) Active. PriLOSEC (10MG  Capsule DR, Oral) Active. Sildenafil Citrate (20MG  Tablet, Oral) Active. Tylenol PM Extra Strength (500-25MG  Tablet, Oral) Active. Allopurinol (300MG  Tablet, Oral) Active. ALPRAZolam (0.5MG  Tablet, Oral) Active. Carvedilol (6.25MG  Tablet, Oral)  Active. Fluconazole (150MG  Tablet, Oral) Active. Jardiance (25MG  Tablet, Oral) Active. Lantus SoloStar (100UNIT/ML Soln Pen-inj, Subcutaneous) Active. MetFORMIN HCl (1000MG  Tablet, Oral) Active. Nystatin (100000 UNIT/GM Ointment, External) Active. Omega-3-acid Ethyl Esters (1GM Capsule, Oral) Active. Aspirin (81MG  Tablet, Oral) Active. Benadryl Allergy (25MG  Tablet, Oral) Active. Claritin (10MG  Tablet, Oral) Active. Medications Reconciled  Social History Alcohol use  Occasional alcohol use. Caffeine use  Carbonated beverages, Coffee, Tea. No drug use  Tobacco use  Never smoker.  Family History First Degree Relatives  No pertinent family history   Other Problems Anxiety Disorder  Diabetes Mellitus  Diverticulosis  Gastroesophageal Reflux Disease  Hemorrhoids  High blood pressure  Inguinal Hernia  Thyroid Disease   Vitals  Weight: 197.38 lb Height: 67in Body Surface Area: 2.01 m Body Mass Index: 30.91 kg/m  Temp.: 97.7F(Oral)  Pulse: 77 (Regular)  BP: 132/82 (Sitting, Left Arm, Standard)  Physical Exam  See vital signs recorded above  GENERAL APPEARANCE Development: normal Nutritional status: normal Gross deformities: none  SKIN Rash, lesions, ulcers: none Induration, erythema: none Nodules: none palpable  EYES Conjunctiva and lids: normal Pupils: equal and reactive Iris: normal bilaterally  EARS, NOSE, MOUTH, THROAT External ears: no lesion or deformity External nose: no lesion or deformity Hearing: grossly normal Lips: no lesion or deformity Dentition: normal for age Oral mucosa: moist  NECK Symmetric: yes Trachea: midline Thyroid: Palpable nodule inferior left thyroid lobe extending beneath the left clavicle, mobile with swallowing, nontender, relatively firm. No palpable abnormality right thyroid lobe.  CHEST Respiratory effort: normal Retraction or accessory muscle use: no Breath sounds: normal  bilaterally Rales, rhonchi, wheeze: none  CARDIOVASCULAR Auscultation: regular rhythm, normal rate Murmurs: none Pulses: carotid and radial pulse 2+ palpable Lower extremity edema: none Lower extremity varicosities: none  MUSCULOSKELETAL Station and gait: normal  Digits and nails: no clubbing or cyanosis Muscle strength: grossly normal all extremities Range of motion: grossly normal all extremities Deformity: none  LYMPHATIC Cervical: none palpable Supraclavicular: none palpable  PSYCHIATRIC Oriented to person, place, and time: yes Mood and affect: normal for situation Judgment and insight: appropriate for situation    Assessment & Plan  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 Patient has a known thyroid neoplasm in the left thyroid lobe which has approximately a 50% risk of malignancy. We have previously discussed total thyroidectomy. We have discussed the location of the surgical incision as well as the hospital stay to be anticipated. We have discussed the need for lifelong thyroid hormone replacement. We have discussed the potential need for radioactive iodine treatment in the event of malignancy. Patient understands and wishes to proceed with thyroid surgery in the near future.  The risks and benefits of the procedure have been discussed at length with the patient. The patient understands the proposed procedure, potential alternative treatments, and the course of recovery to be expected. All of the patient's questions have been answered at this time. The patient wishes to proceed with surgery.  [name], [facility] Office: [phone]

## 2017-10-17 ENCOUNTER — Ambulatory Visit (HOSPITAL_COMMUNITY): Payer: Medicare Other | Admitting: Anesthesiology

## 2017-10-17 ENCOUNTER — Ambulatory Visit (HOSPITAL_COMMUNITY): Payer: Medicare Other

## 2017-10-17 ENCOUNTER — Other Ambulatory Visit: Payer: Self-pay

## 2017-10-17 ENCOUNTER — Observation Stay (HOSPITAL_COMMUNITY)
Admission: RE | Admit: 2017-10-17 | Discharge: 2017-10-18 | Disposition: A | Discharge status: Home / Self Care | Payer: Medicare Other | Source: Ambulatory Visit | Attending: Surgery | Admitting: Surgery

## 2017-10-17 ENCOUNTER — Encounter (HOSPITAL_COMMUNITY): Payer: Self-pay

## 2017-10-17 ENCOUNTER — Encounter (HOSPITAL_COMMUNITY)
Admission: RE | Disposition: A | Discharge status: Home / Self Care | Payer: Self-pay | Source: Ambulatory Visit | Attending: Surgery

## 2017-10-17 DIAGNOSIS — E119 Type 2 diabetes mellitus without complications: Secondary | ICD-10-CM | POA: Insufficient documentation

## 2017-10-17 DIAGNOSIS — E042 Nontoxic multinodular goiter: Secondary | ICD-10-CM | POA: Diagnosis present

## 2017-10-17 DIAGNOSIS — Z882 Allergy status to sulfonamides status: Secondary | ICD-10-CM | POA: Diagnosis not present

## 2017-10-17 DIAGNOSIS — Z880 Allergy status to penicillin: Secondary | ICD-10-CM | POA: Insufficient documentation

## 2017-10-17 DIAGNOSIS — E039 Hypothyroidism, unspecified: Secondary | ICD-10-CM | POA: Insufficient documentation

## 2017-10-17 DIAGNOSIS — F419 Anxiety disorder, unspecified: Secondary | ICD-10-CM | POA: Diagnosis not present

## 2017-10-17 DIAGNOSIS — K219 Gastro-esophageal reflux disease without esophagitis: Secondary | ICD-10-CM | POA: Diagnosis not present

## 2017-10-17 DIAGNOSIS — Z79899 Other long term (current) drug therapy: Secondary | ICD-10-CM | POA: Insufficient documentation

## 2017-10-17 DIAGNOSIS — C73 Malignant neoplasm of thyroid gland: Secondary | ICD-10-CM | POA: Diagnosis present

## 2017-10-17 DIAGNOSIS — Z881 Allergy status to other antibiotic agents status: Secondary | ICD-10-CM | POA: Insufficient documentation

## 2017-10-17 DIAGNOSIS — I10 Essential (primary) hypertension: Secondary | ICD-10-CM | POA: Insufficient documentation

## 2017-10-17 DIAGNOSIS — Z7982 Long term (current) use of aspirin: Secondary | ICD-10-CM | POA: Insufficient documentation

## 2017-10-17 DIAGNOSIS — Z794 Long term (current) use of insulin: Secondary | ICD-10-CM | POA: Diagnosis not present

## 2017-10-17 DIAGNOSIS — D440 Neoplasm of uncertain behavior of thyroid gland: Secondary | ICD-10-CM | POA: Diagnosis present

## 2017-10-17 DIAGNOSIS — Z01818 Encounter for other preprocedural examination: Secondary | ICD-10-CM

## 2017-10-17 HISTORY — PX: THYROIDECTOMY: SHX17

## 2017-10-17 LAB — GLUCOSE, CAPILLARY
Glucose-Capillary: 123 mg/dL — ABNORMAL HIGH (ref 65–99)
Glucose-Capillary: 125 mg/dL — ABNORMAL HIGH (ref 65–99)
Glucose-Capillary: 193 mg/dL — ABNORMAL HIGH (ref 65–99)
Glucose-Capillary: 158 mg/dL — ABNORMAL HIGH (ref 65–99)
Glucose-Capillary: 162 mg/dL — ABNORMAL HIGH (ref 65–99)

## 2017-10-17 SURGERY — THYROIDECTOMY
Anesthesia: General | Site: Neck

## 2017-10-17 MED ORDER — OXYCODONE HCL 5 MG/5ML PO SOLN
5.0000 mg | Freq: Once | ORAL | Status: DC | PRN
  Filled 2017-10-17: qty 5

## 2017-10-17 MED ORDER — ACETAMINOPHEN 325 MG PO TABS: 650.0000 mg | ORAL_TABLET | Freq: Four times a day (QID) | ORAL | Status: DC | PRN

## 2017-10-17 MED ORDER — ALPRAZOLAM 0.25 MG PO TABS: 0.2500 mg | ORAL_TABLET | Freq: Every evening | ORAL | Status: DC | PRN

## 2017-10-17 MED ORDER — ROCURONIUM BROMIDE 10 MG/ML (PF) SYRINGE
PREFILLED_SYRINGE | INTRAVENOUS | Status: DC | PRN
  Administered 2017-10-17: 10 mg via INTRAVENOUS
  Administered 2017-10-17: 50 mg via INTRAVENOUS

## 2017-10-17 MED ORDER — PROPOFOL 10 MG/ML IV BOLUS
INTRAVENOUS | Status: DC | PRN
  Administered 2017-10-17: 150 mg via INTRAVENOUS

## 2017-10-17 MED ORDER — ROCURONIUM BROMIDE 10 MG/ML (PF) SYRINGE
PREFILLED_SYRINGE | INTRAVENOUS | Status: AC
  Filled 2017-10-17: qty 5

## 2017-10-17 MED ORDER — ACETAMINOPHEN 650 MG RE SUPP: 650.0000 mg | Freq: Four times a day (QID) | RECTAL | Status: DC | PRN

## 2017-10-17 MED ORDER — FENTANYL CITRATE (PF) 100 MCG/2ML IJ SOLN
INTRAMUSCULAR | Status: DC | PRN
  Administered 2017-10-17: 50 ug via INTRAVENOUS
  Administered 2017-10-17: 25 ug via INTRAVENOUS
  Administered 2017-10-17 (×2): 50 ug via INTRAVENOUS

## 2017-10-17 MED ORDER — DEXAMETHASONE SODIUM PHOSPHATE 10 MG/ML IJ SOLN
INTRAMUSCULAR | Status: DC | PRN
  Administered 2017-10-17: 8 mg via INTRAVENOUS

## 2017-10-17 MED ORDER — SODIUM CHLORIDE 0.9 % IJ SOLN
INTRAMUSCULAR | Status: AC
  Filled 2017-10-17: qty 10

## 2017-10-17 MED ORDER — POTASSIUM CHLORIDE IN NACL 20-0.45 MEQ/L-% IV SOLN
INTRAVENOUS | Status: DC
  Administered 2017-10-17: 13:00:00 via INTRAVENOUS
  Filled 2017-10-17 (×2): qty 1000

## 2017-10-17 MED ORDER — SUGAMMADEX SODIUM 200 MG/2ML IV SOLN
INTRAVENOUS | Status: DC | PRN
  Administered 2017-10-17: 200 mg via INTRAVENOUS

## 2017-10-17 MED ORDER — HYDROMORPHONE HCL 1 MG/ML IJ SOLN: 1.0000 mg | INTRAMUSCULAR | Status: DC | PRN

## 2017-10-17 MED ORDER — PROPOFOL 10 MG/ML IV BOLUS
INTRAVENOUS | Status: AC
  Filled 2017-10-17: qty 20

## 2017-10-17 MED ORDER — FENTANYL CITRATE (PF) 250 MCG/5ML IJ SOLN
INTRAMUSCULAR | Status: AC
  Filled 2017-10-17: qty 5

## 2017-10-17 MED ORDER — INSULIN GLARGINE 100 UNIT/ML ~~LOC~~ SOLN
48.0000 [IU] | Freq: Every day | SUBCUTANEOUS | Status: DC
  Administered 2017-10-17: 48 [IU] via SUBCUTANEOUS
  Filled 2017-10-17: qty 0.48

## 2017-10-17 MED ORDER — INSULIN ASPART 100 UNIT/ML ~~LOC~~ SOLN
0.0000 [IU] | Freq: Three times a day (TID) | SUBCUTANEOUS | Status: DC
  Administered 2017-10-17 (×2): 3 [IU] via SUBCUTANEOUS
  Administered 2017-10-18: 2 [IU] via SUBCUTANEOUS

## 2017-10-17 MED ORDER — SODIUM CHLORIDE 0.9 % IR SOLN
Status: DC | PRN
  Administered 2017-10-17: 1000 mL

## 2017-10-17 MED ORDER — HYDROMORPHONE HCL 1 MG/ML IJ SOLN
INTRAMUSCULAR | Status: AC
  Filled 2017-10-17: qty 1

## 2017-10-17 MED ORDER — CARVEDILOL 6.25 MG PO TABS
6.2500 mg | ORAL_TABLET | Freq: Two times a day (BID) | ORAL | Status: DC
  Administered 2017-10-17: 6.25 mg via ORAL
  Filled 2017-10-17: qty 1

## 2017-10-17 MED ORDER — ONDANSETRON HCL 4 MG/2ML IJ SOLN
INTRAMUSCULAR | Status: AC
  Filled 2017-10-17: qty 2

## 2017-10-17 MED ORDER — CHLORHEXIDINE GLUCONATE CLOTH 2 % EX PADS: 6.0000 | MEDICATED_PAD | Freq: Once | CUTANEOUS | Status: DC

## 2017-10-17 MED ORDER — ALLOPURINOL 300 MG PO TABS: 300.0000 mg | ORAL_TABLET | Freq: Every day | ORAL | Status: DC

## 2017-10-17 MED ORDER — MIDAZOLAM HCL 2 MG/2ML IJ SOLN
INTRAMUSCULAR | Status: AC
  Filled 2017-10-17: qty 2

## 2017-10-17 MED ORDER — PROMETHAZINE HCL 25 MG/ML IJ SOLN: 6.2500 mg | INTRAMUSCULAR | Status: DC | PRN

## 2017-10-17 MED ORDER — FUROSEMIDE 40 MG PO TABS
40.0000 mg | ORAL_TABLET | Freq: Every day | ORAL | Status: DC
Start: 2017-10-17 — End: 2017-10-18
  Administered 2017-10-17: 40 mg via ORAL
  Filled 2017-10-17: qty 1

## 2017-10-17 MED ORDER — ONDANSETRON 4 MG PO TBDP: 4.0000 mg | ORAL_TABLET | Freq: Four times a day (QID) | ORAL | Status: DC | PRN

## 2017-10-17 MED ORDER — VANCOMYCIN HCL IN DEXTROSE 1-5 GM/200ML-% IV SOLN
1000.0000 mg | INTRAVENOUS | Status: AC
  Administered 2017-10-17: 1000 mg via INTRAVENOUS
  Filled 2017-10-17: qty 200

## 2017-10-17 MED ORDER — HYDROMORPHONE HCL 1 MG/ML IJ SOLN
0.2500 mg | INTRAMUSCULAR | Status: DC | PRN
  Administered 2017-10-17 (×4): 0.5 mg via INTRAVENOUS

## 2017-10-17 MED ORDER — LIDOCAINE 2% (20 MG/ML) 5 ML SYRINGE
INTRAMUSCULAR | Status: AC
  Filled 2017-10-17: qty 5

## 2017-10-17 MED ORDER — METFORMIN HCL 500 MG PO TABS
1000.0000 mg | ORAL_TABLET | Freq: Two times a day (BID) | ORAL | Status: DC
  Administered 2017-10-17 – 2017-10-18 (×2): 1000 mg via ORAL
  Filled 2017-10-17 (×2): qty 2

## 2017-10-17 MED ORDER — EPHEDRINE SULFATE-NACL 50-0.9 MG/10ML-% IV SOSY
PREFILLED_SYRINGE | INTRAVENOUS | Status: DC | PRN
  Administered 2017-10-17 (×2): 5 mg via INTRAVENOUS

## 2017-10-17 MED ORDER — SUGAMMADEX SODIUM 200 MG/2ML IV SOLN
INTRAVENOUS | Status: AC
  Filled 2017-10-17: qty 2

## 2017-10-17 MED ORDER — EPHEDRINE SULFATE 50 MG/ML IJ SOLN
INTRAMUSCULAR | Status: AC
Start: 2017-10-17 — End: ?
  Filled 2017-10-17: qty 1

## 2017-10-17 MED ORDER — ONDANSETRON HCL 4 MG/2ML IJ SOLN
INTRAMUSCULAR | Status: DC | PRN
  Administered 2017-10-17: 4 mg via INTRAVENOUS

## 2017-10-17 MED ORDER — DEXAMETHASONE SODIUM PHOSPHATE 10 MG/ML IJ SOLN
INTRAMUSCULAR | Status: AC
  Filled 2017-10-17: qty 1

## 2017-10-17 MED ORDER — ONDANSETRON HCL 4 MG/2ML IJ SOLN: 4.0000 mg | Freq: Four times a day (QID) | INTRAMUSCULAR | Status: DC | PRN

## 2017-10-17 MED ORDER — MIDAZOLAM HCL 5 MG/5ML IJ SOLN
INTRAMUSCULAR | Status: DC | PRN
  Administered 2017-10-17: 2 mg via INTRAVENOUS

## 2017-10-17 MED ORDER — LACTATED RINGERS IV SOLN
INTRAVENOUS | Status: DC
  Administered 2017-10-17: 1000 mL via INTRAVENOUS

## 2017-10-17 MED ORDER — LIDOCAINE 2% (20 MG/ML) 5 ML SYRINGE
INTRAMUSCULAR | Status: DC | PRN
  Administered 2017-10-17: 80 mg via INTRAVENOUS

## 2017-10-17 MED ORDER — HYDROCODONE-ACETAMINOPHEN 5-325 MG PO TABS
1.0000 | ORAL_TABLET | ORAL | Status: DC | PRN
  Administered 2017-10-17 (×2): 2 via ORAL
  Filled 2017-10-17 (×2): qty 2

## 2017-10-17 MED ORDER — LACTATED RINGERS IV SOLN
INTRAVENOUS | Status: DC | PRN
  Administered 2017-10-17: 07:00:00 via INTRAVENOUS

## 2017-10-17 MED ORDER — CANAGLIFLOZIN 100 MG PO TABS
100.0000 mg | ORAL_TABLET | Freq: Every day | ORAL | Status: DC
  Administered 2017-10-18: 100 mg via ORAL
  Filled 2017-10-17: qty 1

## 2017-10-17 MED ORDER — TRAMADOL HCL 50 MG PO TABS
50.0000 mg | ORAL_TABLET | Freq: Four times a day (QID) | ORAL | Status: DC | PRN
  Administered 2017-10-18: 50 mg via ORAL
  Filled 2017-10-17: qty 1

## 2017-10-17 MED ORDER — OXYCODONE HCL 5 MG PO TABS: 5.0000 mg | ORAL_TABLET | Freq: Once | ORAL | Status: DC | PRN

## 2017-10-17 SURGICAL SUPPLY — 36 items
ATTRACTOMAT 16X20 MAGNETIC DRP (DRAPES) ×2 IMPLANT
BENZOIN TINCTURE PRP APPL 2/3 (GAUZE/BANDAGES/DRESSINGS) ×2 IMPLANT
BLADE SURG 15 STRL LF DISP TIS (BLADE) ×1 IMPLANT
BLADE SURG 15 STRL SS (BLADE) ×1
CHLORAPREP W/TINT 26ML (MISCELLANEOUS) ×4 IMPLANT
CLIP VESOCCLUDE MED 6/CT (CLIP) ×6 IMPLANT
CLIP VESOCCLUDE SM WIDE 6/CT (CLIP) ×8 IMPLANT
COVER SURGICAL LIGHT HANDLE (MISCELLANEOUS) ×2 IMPLANT
DISSECTOR ROUND CHERRY 3/8 STR (MISCELLANEOUS) IMPLANT
DRAPE LAPAROTOMY T 98X78 PEDS (DRAPES) ×2 IMPLANT
ELECT PENCIL ROCKER SW 15FT (MISCELLANEOUS) ×2 IMPLANT
ELECT REM PT RETURN 15FT ADLT (MISCELLANEOUS) ×2 IMPLANT
GAUZE 4X4 16PLY RFD (DISPOSABLE) ×2 IMPLANT
GAUZE SPONGE 4X4 12PLY STRL (GAUZE/BANDAGES/DRESSINGS) ×2 IMPLANT
GLOVE SURG ORTHO 8.0 STRL STRW (GLOVE) ×2 IMPLANT
GOWN STRL REUS W/TWL XL LVL3 (GOWN DISPOSABLE) ×4 IMPLANT
HEMOSTAT SURGICEL 2X4 FIBR (HEMOSTASIS) ×2 IMPLANT
ILLUMINATOR WAVEGUIDE N/F (MISCELLANEOUS) ×2 IMPLANT
KIT BASIN OR (CUSTOM PROCEDURE TRAY) ×2 IMPLANT
LIGHT WAVEGUIDE WIDE FLAT (MISCELLANEOUS) IMPLANT
PACK BASIC VI WITH GOWN DISP (CUSTOM PROCEDURE TRAY) ×2 IMPLANT
POWDER SURGICEL 3.0 GRAM (HEMOSTASIS) IMPLANT
SHEARS HARMONIC 9CM CVD (BLADE) ×2 IMPLANT
STAPLER VISISTAT 35W (STAPLE) IMPLANT
STRIP CLOSURE SKIN 1/2X4 (GAUZE/BANDAGES/DRESSINGS) ×2 IMPLANT
SUT MNCRL AB 4-0 PS2 18 (SUTURE) ×2 IMPLANT
SUT SILK 2 0 (SUTURE)
SUT SILK 2-0 18XBRD TIE 12 (SUTURE) IMPLANT
SUT SILK 3 0 (SUTURE)
SUT SILK 3-0 18XBRD TIE 12 (SUTURE) IMPLANT
SUT VIC AB 3-0 SH 18 (SUTURE) ×4 IMPLANT
SYR BULB IRRIGATION 50ML (SYRINGE) ×2 IMPLANT
TAPE CLOTH SURG 4X10 WHT LF (GAUZE/BANDAGES/DRESSINGS) ×2 IMPLANT
TOWEL OR 17X26 10 PK STRL BLUE (TOWEL DISPOSABLE) ×2 IMPLANT
TOWEL OR NON WOVEN STRL DISP B (DISPOSABLE) ×2 IMPLANT
YANKAUER SUCT BULB TIP 10FT TU (MISCELLANEOUS) ×2 IMPLANT

## 2017-10-17 NOTE — Transfer of Care (Signed)
Immediate Anesthesia Transfer of Care Note  Patient: Nathan Medina  Procedure(s) Performed: TOTAL THYROIDECTOMY (N/A Neck)  Patient Location: PACU  Anesthesia Type:General  Level of Consciousness: awake, alert , oriented and patient cooperative  Airway & Oxygen Therapy: Patient Spontanous Breathing and Patient connected to face mask oxygen  Post-op Assessment: Report given to RN, Post -op Vital signs reviewed and stable and Patient moving all extremities  Post vital signs: Reviewed and stable  Last Vitals:  Vitals Value Taken Time  BP 147/98 10/17/2017  9:10 AM  Temp    Pulse 63 10/17/2017  9:10 AM  Resp 13 10/17/2017  9:10 AM  SpO2 99 % 10/17/2017  9:10 AM  Vitals shown include unvalidated device data.  Last Pain:  Vitals:   10/17/17 0650  TempSrc:   PainSc: 0-No pain      Patients Stated Pain Goal: 5 (72/90/21 1155)  Complications: No apparent anesthesia complications

## 2017-10-17 NOTE — Interval H&P Note (Signed)
History and Physical Interval Note:  10/17/2017 7:02 AM  Nathan Medina  has presented today for surgery, with the diagnosis of Thyroid neoplasm of uncertain behavior.  The various methods of treatment have been discussed with the patient and family. After consideration of risks, benefits and other options for treatment, the patient has consented to    Procedure(s): TOTAL THYROIDECTOMY (N/A) as a surgical intervention .    The patient's history has been reviewed, patient examined, no change in status, stable for surgery.  I have reviewed the patient's chart and labs.  Questions were answered to the patient's satisfaction.    [name], [facility]: [place]

## 2017-10-17 NOTE — Op Note (Signed)
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  Patient returns to my office after approximately a 6 month absence to discuss proceeding with total thyroidectomy for definitive diagnosis and management of a 2.5 cm neoplasm in the left thyroid lobe. This has previously been biopsied. Testing includes molecular genetic analysis which was suspicious, giving a 50% risk of malignancy. Patient has been delayed in seeking definitive treatment due to other medical concerns including a left pulmonary nodule which is being followed by his pulmonologist at [place] [facility] and a cystic neoplasm of the kidney which is being evaluated by his urologist. Patient would now like to schedule thyroid surgery in the near future.  Procedure Details: Procedure was done in OR #1 at the [facility].  The patient was brought to the operating room and placed in a supine position on the operating room table.  Following administration of general anesthesia, the patient was positioned and then prepped and draped in the usual aseptic fashion.  After ascertaining that an adequate level of anesthesia had been achieved, a Kocher incision was made with #15 blade.  Dissection was carried through subcutaneous tissues and platysma. Hemostasis was achieved with the electrocautery.  Skin flaps were elevated cephalad and caudad from the thyroid notch to the sternal notch.  The Mahorner self-retaining retractor was placed for exposure.  Strap muscles were incised in the midline and dissection was begun on the left side.  Strap muscles were reflected laterally.  Left  thyroid lobe was normal in sized with a dominant nodule in the inferior portion of the lobe.  The left lobe was gently mobilized with blunt dissection.  Superior pole vessels were dissected out and divided individually between small and medium Ligaclips with the Harmonic scalpel.  The thyroid lobe was rolled anteriorly.  Branches of the inferior thyroid artery were divided between small Ligaclips with the Harmonic scalpel.  Inferior venous tributaries were divided between Ligaclips.  Both the superior and inferior parathyroid glands were identified and preserved on their vascular pedicles.  The recurrent laryngeal nerve was identified and preserved along its course.  The ligament of Gwenlyn Found was released with the electrocautery and the gland was mobilized onto the anterior trachea. Isthmus was mobilized across the midline.  There was no significant pyramidal lobe present.  Dry pack was placed in the left neck.  Next, the right thyroid lobe was gently mobilized with blunt dissection.  Right thyroid lobe was mildly enlarged.  Superior pole vessels were dissected out and divided between small and medium Ligaclips with the Harmonic scalpel.  Superior parathyroid was identified and preserved.  Inferior venous tributaries were divided between medium Ligaclips with the Harmonic scalpel.  The right thyroid lobe was rolled anteriorly and the branches of the inferior thyroid artery divided between small Ligaclips.  The right recurrent laryngeal nerve was identified and preserved along its course.  The ligament of Gwenlyn Found was released with the electrocautery.  The right thyroid lobe was mobilized onto the anterior trachea and the remainder of the thyroid was dissected off the anterior trachea and the thyroid was completely excised.  A suture was used to  mark the left lobe. The entire thyroid gland was submitted to pathology for review.  The neck was irrigated with warm saline.  Fibrillar was placed throughout the operative  field.  Strap muscles were reapproximated in the midline with interrupted 3-0 Vicryl sutures.  Platysma was closed with interrupted 3-0 Vicryl sutures.  Skin was closed with a running 4-0 Monocryl subcuticular suture.  Wound was washed and dried and steri-strips were applied.  Dry gauze dressing was placed.  The patient was awakened from anesthesia and brought to the recovery room.  The patient tolerated the procedure well.   [name], MD [facility] Office: [phone]

## 2017-10-17 NOTE — Anesthesia Procedure Notes (Signed)

## 2017-10-17 NOTE — Anesthesia Preprocedure Evaluation (Addendum)
Anesthesia Evaluation  Patient identified by MRN, date of birth, ID band Patient awake    Reviewed: Allergy & Precautions, NPO status , Patient's Chart, lab work & pertinent test results, reviewed documented beta blocker date and time   Airway Mallampati: III  TM Distance: >3 FB Neck ROM: Full    Dental no notable dental hx.    Pulmonary  Sees Pulmonologist in [place]   Pulmonary exam normal breath sounds clear to auscultation       Cardiovascular hypertension, Pt. on home beta blockers Normal cardiovascular exam Rhythm:Regular Rate:Normal  ECG: SB, LAD, rate 53   Neuro/Psych PSYCHIATRIC DISORDERS Anxiety Depression Vertigo    GI/Hepatic Neg liver ROS, GERD  Medicated and Controlled,IBS   Endo/Other  diabetes, Insulin Dependent, Oral Hypoglycemic Agents  Renal/GU Renal disease     Musculoskeletal Gout   Abdominal (+) + obese,   Peds  Hematology HLD   Anesthesia Other Findings Thyroid neoplasm of uncertain behavoir  Reproductive/Obstetrics                            Anesthesia Physical Anesthesia Plan  ASA: III  Anesthesia Plan: General   Post-op Pain Management:    Induction: Intravenous  PONV Risk Score and Plan: 2 and Ondansetron, Dexamethasone, Midazolam and Treatment may vary due to age or medical condition  Airway Management Planned: Oral ETT  Additional Equipment:   Intra-op Plan:   Post-operative Plan: Extubation in OR  Informed Consent: I have reviewed the patients History and Physical, chart, labs and discussed the procedure including the risks, benefits and alternatives for the proposed anesthesia with the patient or authorized representative who has indicated his/her understanding and acceptance.   Dental advisory given  Plan Discussed with: CRNA  Anesthesia Plan Comments:         Anesthesia Quick Evaluation

## 2017-10-17 NOTE — Anesthesia Postprocedure Evaluation (Signed)
Anesthesia Post Note  Patient: Nathan Medina  Procedure(s) Performed: TOTAL THYROIDECTOMY (N/A Neck)     Patient location during evaluation: PACU Anesthesia Type: General Level of consciousness: awake and alert Pain management: pain level controlled Vital Signs Assessment: post-procedure vital signs reviewed and stable Respiratory status: spontaneous breathing, nonlabored ventilation, respiratory function stable and patient connected to nasal cannula oxygen Cardiovascular status: blood pressure returned to baseline and stable Postop Assessment: no apparent nausea or vomiting Anesthetic complications: no    Last Vitals:  Vitals:   10/17/17 1402 10/17/17 1457  BP: (!) 146/82 127/66  Pulse: 64 64  Resp: 14 14  Temp: 36.8 C 36.9 C  SpO2: 96% 93%    Last Pain:  Vitals:   10/17/17 1402  TempSrc: Oral  PainSc:                  Nathan Medina

## 2017-10-18 ENCOUNTER — Encounter (HOSPITAL_COMMUNITY): Payer: Self-pay | Admitting: Surgery

## 2017-10-18 DIAGNOSIS — C73 Malignant neoplasm of thyroid gland: Secondary | ICD-10-CM | POA: Diagnosis not present

## 2017-10-18 LAB — BASIC METABOLIC PANEL
Anion gap: 10 (ref 5–15)
BUN: 56 mg/dL — ABNORMAL HIGH (ref 6–20)
CO2: 25 mmol/L (ref 22–32)
Calcium: 9 mg/dL (ref 8.9–10.3)
Chloride: 105 mmol/L (ref 101–111)
Creatinine, Ser: 1.79 mg/dL — ABNORMAL HIGH (ref 0.61–1.24)
GFR calc Af Amer: 43 mL/min — ABNORMAL LOW (ref >60)
GFR calc non Af Amer: 38 mL/min — ABNORMAL LOW (ref >60)
Glucose, Bld: 137 mg/dL — ABNORMAL HIGH (ref 65–99)
Potassium: 4.4 mmol/L (ref 3.5–5.1)
Sodium: 140 mmol/L (ref 135–145)

## 2017-10-18 LAB — GLUCOSE, CAPILLARY: Glucose-Capillary: 137 mg/dL — ABNORMAL HIGH (ref 65–99)

## 2017-10-18 MED ORDER — LEVOTHYROXINE SODIUM 100 MCG PO TABS: 100.0000 ug | ORAL_TABLET | Freq: Every day | ORAL | 3 refills | Status: DC

## 2017-10-18 MED ORDER — CALCIUM CARBONATE ANTACID 500 MG PO CHEW: 2.0000 | CHEWABLE_TABLET | Freq: Two times a day (BID) | ORAL | 1 refills | Status: DC

## 2017-10-18 MED ORDER — TRAMADOL HCL 50 MG PO TABS: 50.0000 mg | ORAL_TABLET | Freq: Four times a day (QID) | ORAL | 0 refills | Status: DC | PRN

## 2017-10-18 NOTE — Progress Notes (Signed)
Discharge instructions discussed with patient, verbalized agreement and understanding, prescription given to patient 

## 2017-10-18 NOTE — Discharge Summary (Signed)
Physician Discharge Summary [facility]  Patient ID: Nathan Medina MRN: [id number] DOB/AGE: 01/14/1951 67 y.o.  Admit date: 10/17/2017 Discharge date: 10/18/2017  Admission Diagnoses:  Thyroid neoplasm of uncertain behavior  Discharge Diagnoses:  Principal Problem:   Neoplasm of uncertain behavior of thyroid gland Active Problems:   Multiple thyroid nodules   Discharged Condition: good  Hospital Course: Patient was admitted for observation following thyroid surgery.  Post op course was uncomplicated.  Pain was well controlled.  Tolerated diet.  Post op calcium level on morning following surgery was 9.0 mg/dl.  Patient was prepared for discharge home on POD#1.  Consults: None  Treatments: surgery: total thyroidectomy  Discharge Exam: Blood pressure 115/65, pulse 61, temperature 97.7 F (36.5 C), temperature source Oral, resp. rate 15, height 5\' 7"  (1.702 m), weight 88 kg (194 lb), SpO2 96 %. HEENT - clear Neck - wound dry and intact; minimal STS; voice normal Chest - clear bilaterally Cor - RRR  Disposition: Home  Discharge Instructions    Diet - low sodium heart healthy   Complete by:  As directed    Discharge instructions   Complete by:  As directed    [facility]  THYROID & PARATHYROID SURGERY:  POST-OP INSTRUCTIONS  Always review your discharge instruction sheet from the facility where your surgery was performed.  A prescription for pain medication may be given to you upon discharge.  Take your pain medication as prescribed.  If narcotic pain medicine is not needed, then you may take acetaminophen (Tylenol) or ibuprofen (Advil) as needed.  Take your usually prescribed medications unless otherwise directed.  If you need a refill on your pain medication, please contact our office during regular business hours.  Prescriptions cannot be processed by our office after 5 pm or on weekends.  Start with a light diet upon  arrival home, such as soup and crackers or toast.  Be sure to drink plenty of fluids daily.  Resume your normal diet the day after surgery.  Most patients will experience some swelling and bruising on the chest and neck area.  Ice packs will help.  Swelling and bruising can take several days to resolve.   It is common to experience some constipation after surgery.  Increasing fluid intake and taking a stool softener (Colace) will usually help or prevent this problem.  A mild laxative (Milk of Magnesia or Miralax) should be taken according to package directions if there has been no bowel movement after 48 hours.  You have steri-strips and a gauze dressing over your incision.  You may remove the gauze bandage on the second day after surgery, and you may shower at that time.  Leave your steri-strips (small skin tapes) in place directly over the incision.  These strips should remain on the skin for 5-7 days and then be removed.  You may get them wet in the shower and pat them dry.  You may resume regular (light) daily activities beginning the next day (such as daily self-care, walking, climbing stairs) gradually increasing activities as tolerated.  You may have sexual intercourse when it is comfortable.  Refrain from any heavy lifting or straining until approved by your doctor.  You may drive when you no longer are taking prescription pain medication, you can comfortably wear a seatbelt, and you can safely maneuver your car and apply brakes.  You should see your doctor in the office for a follow-up appointment approximately three weeks after your  surgery.  Make sure that you call for this appointment within a day or two after you arrive home to insure a convenient appointment time.  WHEN TO CALL YOUR DOCTOR: -- Fever greater than 101.5 -- Inability to urinate -- Nausea and/or vomiting - persistent -- Extreme swelling or bruising -- Continued bleeding from incision -- Increased pain, redness, or  drainage from the incision -- Difficulty swallowing or breathing -- Muscle cramping or spasms -- Numbness or tingling in hands or around lips  The clinic staff is available to answer your questions during regular business hours.  Please don't hesitate to call and ask to speak to one of the nurses if you have concerns.  [name], MD [facility] Office: [phone]   Ice pack   Complete by:  As directed    Increase activity slowly   Complete by:  As directed    Remove dressing in 24 hours   Complete by:  As directed      Allergies as of 10/18/2017      Reactions   Amoxicillin Other (See Comments)   Reaction:  Hiccups  Has patient had a PCN reaction causing immediate rash, facial/tongue/throat swelling, SOB or lightheadedness with hypotension: No Has patient had a PCN reaction causing severe rash involving mucus membranes or skin necrosis: No Has patient had a PCN reaction that required hospitalization No Has patient had a PCN reaction occurring within the last 10 years: No If all of the above answers are "NO", then may proceed with Cephalosporin use.   Escitalopram Oxalate Itching   Ciprofloxacin Rash   Quinapril Hcl Rash   Sulfa Antibiotics Swelling      Medication List    TAKE these medications   acetaminophen 325 MG tablet Commonly known as:  TYLENOL Take 2 tablets (650 mg total) by mouth every 6 (six) hours as needed for mild pain (or Fever >/= 101).   allopurinol 300 MG tablet Commonly known as:  ZYLOPRIM TAKE 1 TABLET BY MOUTH EVERY DAY   ALPRAZolam 0.5 MG tablet Commonly known as:  XANAX Take 1 tablet (0.5 mg total) by mouth daily as needed for sleep. 1/2 - 1 by mouth once daily as needed What changed:    how much to take  when to take this  additional instructions   aspirin EC 81 MG tablet Take 81 mg by mouth every other day.   calcium carbonate 500 MG chewable tablet Commonly known as:  TUMS Chew 2 tablets (400 mg of elemental  calcium total) by mouth 2 (two) times daily.   carvedilol 6.25 MG tablet Commonly known as:  COREG Take 6.25 mg by mouth 2 (two) times daily.   diphenhydrAMINE 25 MG tablet Commonly known as:  BENADRYL Take 25 mg by mouth at bedtime as needed for sleep.   furosemide 40 MG tablet Commonly known as:  LASIX Take 40 mg by mouth daily.   guaiFENesin 600 MG 12 hr tablet Commonly known as:  MUCINEX Take 600 mg by mouth 2 (two) times daily as needed for cough or to loosen phlegm.   insulin glargine 100 unit/mL Sopn Commonly known as:  LANTUS Inject 48 Units into the skin at bedtime.   JARDIANCE 25 MG Tabs tablet Generic drug:  empagliflozin Take 25 mg by mouth daily.   levothyroxine 100 MCG tablet Commonly known as:  SYNTHROID Take 1 tablet (100 mcg total) by mouth daily.   metFORMIN 1000 MG tablet Commonly known as:  GLUCOPHAGE Take 1,000 mg  by mouth 2 (two) times daily.   MICARDIS 80 MG tablet Generic drug:  telmisartan TAKE 1 TABLET BY MOUTH EVERY DAY   omega-3 acid ethyl esters 1 g capsule Commonly known as:  LOVAZA Take 1 g by mouth 2 (two) times daily.   omeprazole 20 MG capsule Commonly known as:  PRILOSEC Take 20 mg by mouth daily before breakfast.   PRESERVISION AREDS 2 PO Take 1 tablet by mouth 2 (two) times daily.   traMADol 50 MG tablet Commonly known as:  ULTRAM Take 1-2 tablets (50-100 mg total) by mouth every 6 (six) hours as needed for moderate pain.   Vitamin D-3 5000 units Tabs Take 5,000 Units by mouth at bedtime.   vitamin E 1000 UNIT capsule Generic drug:  vitamin E Take 1,000 Units by mouth at bedtime.      Follow-up Information    [name], MD. Schedule an appointment as soon as possible for a visit in 3 week(s).   Specialty:  General Surgery Contact information: [address] [place] [address] [phone]           Earnstine Regal, MD, [facility] Office:  [phone]   Signed: Earnstine Regal 10/18/2017, 9:23 AM

## 2017-11-08 ENCOUNTER — Other Ambulatory Visit: Payer: Self-pay | Admitting: Internal Medicine

## 2017-11-08 DIAGNOSIS — N186 End stage renal disease: Secondary | ICD-10-CM

## 2017-11-08 DIAGNOSIS — E1122 Type 2 diabetes mellitus with diabetic chronic kidney disease: Secondary | ICD-10-CM

## 2017-11-08 DIAGNOSIS — C73 Malignant neoplasm of thyroid gland: Secondary | ICD-10-CM

## 2017-11-08 DIAGNOSIS — R0989 Other specified symptoms and signs involving the circulatory and respiratory systems: Secondary | ICD-10-CM

## 2017-11-19 ENCOUNTER — Ambulatory Visit
Admission: RE | Admit: 2017-11-19 | Discharge: 2017-11-19 | Disposition: A | Discharge status: Home / Self Care | Payer: Medicare Other | Source: Ambulatory Visit | Attending: Internal Medicine | Admitting: Internal Medicine

## 2017-11-19 DIAGNOSIS — N186 End stage renal disease: Secondary | ICD-10-CM

## 2017-11-19 DIAGNOSIS — R0989 Other specified symptoms and signs involving the circulatory and respiratory systems: Secondary | ICD-10-CM

## 2017-11-19 DIAGNOSIS — E1122 Type 2 diabetes mellitus with diabetic chronic kidney disease: Secondary | ICD-10-CM

## 2017-11-19 DIAGNOSIS — C73 Malignant neoplasm of thyroid gland: Secondary | ICD-10-CM

## 2017-12-05 ENCOUNTER — Other Ambulatory Visit (HOSPITAL_COMMUNITY): Payer: Self-pay | Admitting: Internal Medicine

## 2017-12-05 DIAGNOSIS — R748 Abnormal levels of other serum enzymes: Secondary | ICD-10-CM

## 2017-12-06 ENCOUNTER — Encounter (HOSPITAL_COMMUNITY): Payer: Self-pay

## 2017-12-06 ENCOUNTER — Ambulatory Visit (HOSPITAL_COMMUNITY): Payer: Medicare Other

## 2017-12-19 ENCOUNTER — Encounter: Payer: Self-pay | Admitting: Pulmonary Disease

## 2017-12-19 ENCOUNTER — Ambulatory Visit: Payer: Medicare Other | Admitting: Pulmonary Disease

## 2017-12-19 DIAGNOSIS — R911 Solitary pulmonary nodule: Secondary | ICD-10-CM | POA: Diagnosis not present

## 2017-12-19 NOTE — Progress Notes (Signed)
Nathan Medina    [id number]    23-Jun-1950  Primary Care Physician:Pharr, Thayer Jew, MD  Referring Physician: [name], MD [place] [facility] [address], [place] [address]  Chief complaint: Consult for pulmonary nodule  HPI: 67 year old with papillary carcinoma thyroid, diabetes, hypertension, chronic kidney disease, GERD sent here for evaluation of pulmonary nodule.  This was first noticed on a CT head and neck in March 2018 which showed a groundglass opacity in the right upper lobe.  He had a follow-up CT in March 2019 which showed increase in size, subsequent PET scan showed low uptake.  He was seen by Dr. Verdie Mosher, Pulmonary at [facility] [place] who recommended surveillance  Hospitalized in March for thyroidectomy, pathology shows papillary cancer of the thyroid, margins are clear.  He has recently seen a cardiologist and is scheduled for cardiac stress test.  Pets: Used to have a cat, no pets currently Occupation: Retired Scientist, physiological for [employer] Exposures: No known exposures, no mold, hot tub, Jacuzzi Smoking history: No smoking history, no secondhand smoke Travel history: Grew up in [place].  Previously lived in [place].  No significant recent travel  Outpatient Encounter Medications as of 12/19/2017  Medication Sig  . acetaminophen (TYLENOL) 325 MG tablet Take 2 tablets (650 mg total) by mouth every 6 (six) hours as needed for mild pain (or Fever >/= 101).  [name] allopurinol (ZYLOPRIM) 300 MG tablet TAKE 1 TABLET BY MOUTH EVERY DAY  . ALPRAZolam (XANAX) 0.5 MG tablet Take 1 tablet (0.5 mg total) by mouth daily as needed for sleep. 1/2 - 1 by mouth once daily as needed (Patient taking differently: Take 0.25-0.5 mg by mouth at bedtime as needed for sleep. )  . amLODipine (NORVASC) 5 MG tablet Take 5 mg by mouth daily.  [name] aspirin EC 81 MG tablet Take 81 mg by mouth every other day.  . calcium carbonate (TUMS) 500 MG chewable tablet Chew 2 tablets (400 mg of  elemental calcium total) by mouth 2 (two) times daily.  . Cholecalciferol (VITAMIN D-3) 5000 units TABS Take 5,000 Units by mouth at bedtime.  . diphenhydrAMINE (BENADRYL) 25 MG tablet Take 25 mg by mouth at bedtime as needed for sleep.  . empagliflozin (JARDIANCE) 25 MG TABS tablet Take 25 mg by mouth daily.  . furosemide (LASIX) 40 MG tablet Take 40 mg by mouth daily.  [name] guaiFENesin (MUCINEX) 600 MG 12 hr tablet Take 600 mg by mouth 2 (two) times daily as needed for cough or to loosen phlegm.  . insulin glargine (LANTUS) 100 unit/mL SOPN Inject 48 Units into the skin at bedtime.   [name] levothyroxine (SYNTHROID) 100 MCG tablet Take 1 tablet (100 mcg total) by mouth daily.  [name] MICARDIS 80 MG tablet TAKE 1 TABLET BY MOUTH EVERY DAY  . Multiple Vitamins-Minerals (PRESERVISION AREDS 2 PO) Take 1 tablet by mouth 2 (two) times daily.  [name] omega-3 acid ethyl esters (LOVAZA) 1 g capsule Take 1 g by mouth 2 (two) times daily.  [name] omeprazole (PRILOSEC) 20 MG capsule Take 20 mg by mouth daily before breakfast.   . traMADol (ULTRAM) 50 MG tablet Take 1-2 tablets (50-100 mg total) by mouth every 6 (six) hours as needed for moderate pain.  . vitamin E (VITAMIN E) 1000 UNIT capsule Take 1,000 Units by mouth at bedtime.  . [DISCONTINUED] carvedilol (COREG) 6.25 MG tablet Take 6.25 mg by mouth 2 (two) times daily.   . [DISCONTINUED] metFORMIN (GLUCOPHAGE) 1000 MG  tablet Take 1,000 mg by mouth 2 (two) times daily.   No facility-administered encounter medications on file as of 12/19/2017.     Allergies as of 12/19/2017 - Review Complete 12/19/2017  Allergen Reaction Noted  . Amoxicillin Other (See Comments) 07/31/2016  . Escitalopram oxalate Itching 06/25/2007  . Ciprofloxacin Rash 06/01/2011  . Quinapril hcl Rash 06/25/2007  . Sulfa antibiotics Swelling 11/23/2010    Past Medical History:  Diagnosis Date  . Abnormal liver function test 05/23/2011  . ALLERGIC RHINITIS   . ANXIETY   . Aortic atherosclerosis  ([place])   . Arthritis   . Bilateral renal cysts   . Cervical spondylolysis    Moderate  . Chronic kidney disease    CKD stage 3 per office visit note 08/08/17 on chart   . COLONIC POLYPS, HX OF   . Complication of anesthesia    slow to wake up   . DEPRESSION   . DIABETES MELLITUS, TYPE II   . Diverticulitis   . Fatty liver   . GERD   . GOUT   . History of inguinal hernia   . HYPERLIPIDEMIA   . HYPERTENSION   . IBS   . 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 Left renal mass 05/23/2011  . Lumbar radicular pain 06/03/2011  . Multinodular goiter   . Pancreatic pseudocyst    Stable  . Peritonsillar abscess   . Pulmonary nodule    Right upper lobe  . Radial neck fracture   . Shortness of breath    occasional shortness of breath   . Thyroid disease   . Thyroid nodule    Bilateral  . Vertigo     Past Surgical History:  Procedure Laterality Date  . APPENDECTOMY  02/12/11  . COLON SURGERY  08/11/10   sigmoid colectomy  . COLON SURGERY  02/12/11   colostomy takedown  . COLONOSCOPY    . HERNIA REPAIR    . INSERTION OF MESH N/A 08/20/2012   Procedure: INSERTION OF MESH;  Surgeon: [name], MD;  Location: [facility];  Service: General;  Laterality: N/A;  . LYSIS OF ADHESION  08/20/2012   Procedure: LYSIS OF ADHESION;  Surgeon: [name], MD;  Location: [facility];  Service: General;;  . SEPTOPLASTY    . THYROIDECTOMY N/A 10/17/2017   Procedure: TOTAL THYROIDECTOMY;  Surgeon: [name], MD;  Location: [facility];  Service: General;  Laterality: N/A;  . TONSILLECTOMY Right 08/01/2016   Procedure: INCISION AND DRAINAGE RIGHT PERI-TONSILLAR ABSCESS;  Surgeon: [name], MD;  Location: [facility];  Service: ENT;  Laterality: Right;  . VENTRAL HERNIA REPAIR  08/20/2012   Procedure: HERNIA REPAIR VENTRAL ADULT;  Surgeon: [name], MD;  [facility]: [facility];  Service: General;;    Family History    Problem Relation Age of Onset  . Hypertension Paternal Aunt   . Stroke Maternal Grandmother   . Hypertension Maternal Grandmother     Social History   Socioeconomic History  . Marital status: Single    Spouse name: Not on file  . Number of children: Not on file  . Years of education: Not on file  . Highest education level: Not on file  Occupational History  . Not on file  Social Needs  . Financial resource strain: Not on file  . Food insecurity:    Worry: Not on file    Inability: Not on  file  . Transportation needs:    Medical: Not on file    Non-medical: Not on file  Tobacco Use  . Smoking status: Never Smoker  . Smokeless tobacco: Never Used  Substance and Sexual Activity  . Alcohol use: Not Currently  . Drug use: No  . Sexual activity: Not on file  Lifestyle  . Physical activity:    Days per week: Not on file    Minutes per session: Not on file  . Stress: Not on file  Relationships  . Social connections:    Talks on phone: Not on file    Gets together: Not on file    Attends religious service: Not on file    Active member of club or organization: Not on file    Attends meetings of clubs or organizations: Not on file    Relationship status: Not on file  . Intimate partner violence:    Fear of current or ex partner: Not on file    Emotionally abused: Not on file    Physically abused: Not on file    Forced sexual activity: Not on file  Other Topics Concern  . Not on file  Social History Narrative  . Not on file    Review of systems: Review of Systems  Constitutional: Negative for fever and chills.  HENT: Negative.   Eyes: Negative for blurred vision.  Respiratory: as per HPI  Cardiovascular: Negative for chest pain and palpitations.  Gastrointestinal: Negative for vomiting, diarrhea, blood per rectum. Genitourinary: Negative for dysuria, urgency, frequency and hematuria.  Musculoskeletal: Negative for myalgias, back pain and joint pain.  Skin:  Negative for itching and rash.  Neurological: Negative for dizziness, tremors, focal weakness, seizures and loss of consciousness.  Endo/Heme/Allergies: Negative for environmental allergies.  Psychiatric/Behavioral: Negative for depression, suicidal ideas and hallucinations.  All other systems reviewed and are negative.  Physical Exam: Blood pressure 134/78, pulse 61, height 5\' 7"  (1.702 m), weight 198 lb (89.8 kg), SpO2 96 %. Gen:      No acute distress HEENT:  EOMI, sclera anicteric Neck:     No masses; no thyromegaly Lungs:    Clear to auscultation bilaterally; normal respiratory effort CV:         Regular rate and rhythm; no murmurs Abd:      + bowel sounds; soft, non-tender; no palpable masses, no distension Ext:    No edema; adequate peripheral perfusion Skin:      Warm and dry; no rash Neuro: alert and oriented x 3 Psych: normal mood and affect  Data Reviewed: CT head and neck 07/31/2016- with thyroid nodules, 12 mm groundglass nodule in the right lung apex. CT chest 08/06/2017- persistent right upper lobe nodule with increase in size compared to 2018 PET scan 08/26/2017- very low metabolic activity in the right upper lobe nodule.  SUV 1.3.  No other significant uptake I reviewed the images personally.  Assessment:  Right upper lobe lung nodule CT reviewed which shows a groundglass opacity.  Although the PET is negative the fact that it is persistent and has grown in size increases suspicion for malignancy.  The last CT scan shows new solid component to the groundglass opacity  Need to evaluate for slow-growing adenocarcinoma.  The best step would be to evaluate for resection as he would likely be able to tolerate surgery and there is no evidence of FDG uptake any where else Refer to cardiothoracic surgery, schedule PFTs and repeat PET scan to re evaluate the lesion  and make sure there is no other new uptake.   Evaluation for coronary artery disease Recently seen a cardiologist  and is scheduled for a stress test.  Plan/Recommendations: - Cardiothoracic surgery referral - PFTs, repeat PET scan  Marshell Garfinkel MD [facility] 12/19/2017, 9:47 AM  CC: [name], MD

## 2017-12-19 NOTE — Addendum Note (Signed)
Addended byMarshell Garfinkel on: 12/19/2017 10:27 AM   Modules accepted: Level of Service

## 2017-12-19 NOTE — Patient Instructions (Signed)
I have reviewed your scans that shows a right upper lobe nodule which has grown from 2018 to 19 Although it is negative on PET scan I am still concerned that this could represent a malignancy  We will refer you to cardiothoracic surgery for evaluation and possible resection We will repeat a PET scan since it has been 4 to 5 months since the last scan Schedule you for pulmonary function test for evaluation of the lung Follow-up in 1 month

## 2017-12-31 ENCOUNTER — Encounter: Payer: Medicare Other | Admitting: Cardiothoracic Surgery

## 2017-12-31 NOTE — Progress Notes (Deleted)
[address]       [place], [address]             [phone]                    Nathan Medina [place] Medical Record #[id number] Date of Birth: 1950-09-21  Referring: Marshell Garfinkel, MD Primary Care: [name], MD Primary Cardiologist: No primary care provider on file.  Chief Complaint:   No chief complaint on file.   History of Present Illness:    Nathan Medina 67 y.o. male is seen in the office  today for       Current Activity/ Functional Status:  {functional status:19517}   Zubrod Score: At the time of surgery this patient's most appropriate activity status/level should be described as: []     0    Normal activity, no symptoms []     1    Restricted in physical strenuous activity but ambulatory, able to do out light work []     2    Ambulatory and capable of self care, unable to do work activities, up and about               >50 % of waking hours                              []     3    Only limited self care, in bed greater than 50% of waking hours []     4    Completely disabled, no self care, confined to bed or chair []     5    Moribund   Past Medical History:  Diagnosis Date  . Abnormal liver function test 05/23/2011  . ALLERGIC RHINITIS   . ANXIETY   . Aortic atherosclerosis ([place])   . Arthritis   . Bilateral renal cysts   . Cervical spondylolysis    Moderate  . Chronic kidney disease    CKD stage 3 per office visit note 08/08/17 on chart   . COLONIC POLYPS, HX OF   . Complication of anesthesia    slow to wake up   . DEPRESSION   . DIABETES MELLITUS, TYPE II   . Diverticulitis   . Fatty liver   . GERD   . GOUT   . History of inguinal hernia   . HYPERLIPIDEMIA   . HYPERTENSION   . IBS   . 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 Left renal mass 05/23/2011  . Lumbar radicular pain 06/03/2011  . Multinodular goiter   . Pancreatic  pseudocyst    Stable  . Peritonsillar abscess   . Pulmonary nodule    Right upper lobe  . Radial neck fracture   . Shortness of breath    occasional shortness of breath   . Thyroid disease   . Thyroid nodule    Bilateral  . Vertigo     Past Surgical History:  Procedure Laterality Date  . APPENDECTOMY  02/12/11  . COLON SURGERY  08/11/10   sigmoid colectomy  . COLON SURGERY  02/12/11   colostomy takedown  . COLONOSCOPY    . HERNIA REPAIR    . INSERTION OF MESH N/A 08/20/2012   Procedure: INSERTION OF MESH;  Surgeon: [name], MD;  [facility];  Service: General;  Laterality: N/A;  .  LYSIS OF ADHESION  08/20/2012   Procedure: LYSIS OF ADHESION;  Surgeon: [name], MD;  [facility];  Service: General;;  . SEPTOPLASTY    . THYROIDECTOMY N/A 10/17/2017   Procedure: TOTAL THYROIDECTOMY;  Surgeon: [name], MD;  [facility];  Service: General;  Laterality: N/A;  . TONSILLECTOMY Right 08/01/2016   Procedure: INCISION AND DRAINAGE RIGHT PERI-TONSILLAR ABSCESS;  Surgeon: [name], MD;  [facility];  Service: ENT;  Laterality: Right;  . VENTRAL HERNIA REPAIR  08/20/2012   Procedure: HERNIA REPAIR VENTRAL ADULT;  Surgeon: [name], MD;  [facility];  Service: General;;    Family History  Problem Relation Age of Onset  . Hypertension Paternal Aunt   . Stroke Maternal Grandmother   . Hypertension Maternal Grandmother      Social History   Tobacco Use  Smoking Status Never Smoker  Smokeless Tobacco Never Used    Social History   Substance and Sexual Activity  Alcohol Use Not Currently     Allergies  Allergen Reactions  . Amoxicillin Other (See Comments)    Reaction:  Hiccups  Has patient had a PCN reaction causing immediate rash, facial/tongue/throat swelling, SOB or lightheadedness with hypotension: No Has patient had a PCN reaction causing severe rash involving mucus membranes or skin necrosis: No Has patient had a PCN  reaction that required hospitalization No Has patient had a PCN reaction occurring within the last 10 years: No If all of the above answers are "NO", then may proceed with Cephalosporin use.  . Escitalopram Oxalate Itching  . Ciprofloxacin Rash  . Quinapril Hcl Rash  . Sulfa Antibiotics Swelling    Current Outpatient Medications  Medication Sig Dispense Refill  . acetaminophen (TYLENOL) 325 MG tablet Take 2 tablets (650 mg total) by mouth every 6 (six) hours as needed for mild pain (or Fever >/= 101). 20 tablet 0  . allopurinol (ZYLOPRIM) 300 MG tablet TAKE 1 TABLET BY MOUTH EVERY DAY 90 tablet 0  . ALPRAZolam (XANAX) 0.5 MG tablet Take 1 tablet (0.5 mg total) by mouth daily as needed for sleep. 1/2 - 1 by mouth once daily as needed (Patient taking differently: Take 0.25-0.5 mg by mouth at bedtime as needed for sleep. ) 90 tablet 2  . amLODipine (NORVASC) 5 MG tablet Take 5 mg by mouth daily.  1  . aspirin EC 81 MG tablet Take 81 mg by mouth every other day.    . calcium carbonate (TUMS) 500 MG chewable tablet Chew 2 tablets (400 mg of elemental calcium total) by mouth 2 (two) times daily. 90 tablet 1  . Cholecalciferol (VITAMIN D-3) 5000 units TABS Take 5,000 Units by mouth at bedtime.    . diphenhydrAMINE (BENADRYL) 25 MG tablet Take 25 mg by mouth at bedtime as needed for sleep.    . empagliflozin (JARDIANCE) 25 MG TABS tablet Take 25 mg by mouth daily.    . furosemide (LASIX) 40 MG tablet Take 40 mg by mouth daily.    [name] guaiFENesin (MUCINEX) 600 MG 12 hr tablet Take 600 mg by mouth 2 (two) times daily as needed for cough or to loosen phlegm.    . insulin glargine (LANTUS) 100 unit/mL SOPN Inject 48 Units into the skin at bedtime.     [name] levothyroxine (SYNTHROID) 100 MCG tablet Take 1 tablet (100 mcg total) by mouth daily. 30 tablet 3  . MICARDIS 80 MG tablet TAKE 1 TABLET BY MOUTH EVERY DAY  90 tablet 3  . Multiple Vitamins-Minerals (PRESERVISION AREDS 2 PO) Take 1 tablet by mouth 2 (two)  times daily.    [name] omega-3 acid ethyl esters (LOVAZA) 1 g capsule Take 1 g by mouth 2 (two) times daily.    [name] omeprazole (PRILOSEC) 20 MG capsule Take 20 mg by mouth daily before breakfast.     . traMADol (ULTRAM) 50 MG tablet Take 1-2 tablets (50-100 mg total) by mouth every 6 (six) hours as needed for moderate pain. 24 tablet 0  . vitamin E (VITAMIN E) 1000 UNIT capsule Take 1,000 Units by mouth at bedtime.     No current facility-administered medications for this visit.     {Ros - complete:30496}   Review of Systems:     Cardiac Review of Systems: [Y] = yes  or   [ N ] = no   Chest Pain [    ]  Resting SOB [   ] Exertional SOB  [  ]  Orthopnea [  ]   Pedal Edema [   ]    Palpitations [  ] Syncope  [  ]   Presyncope [   ]   General Review of Systems: [Y] = yes [  ]=no Constitional: recent weight change [  ];  Wt loss over the last 3 months [   ] anorexia [  ]; fatigue [  ]; nausea [  ]; night sweats [  ]; fever [  ]; or chills [  ];           Eye : blurred vision [  ]; diplopia [   ]; vision changes [  ];  Amaurosis fugax[  ]; Resp: cough [  ];  wheezing[  ];  hemoptysis[  ]; shortness of breath[  ]; paroxysmal nocturnal dyspnea[  ]; dyspnea on exertion[  ]; or orthopnea[  ];  GI:  gallstones[  ], vomiting[  ];  dysphagia[  ]; melena[  ];  hematochezia [  ]; heartburn[  ];   Hx of  Colonoscopy[  ]; GU: kidney stones [  ]; hematuria[  ];   dysuria [  ];  nocturia[  ];  history of     obstruction [  ]; urinary frequency [  ]             Skin: rash, swelling[  ];, hair loss[  ];  peripheral edema[  ];  or itching[  ]; Musculosketetal: myalgias[  ];  joint swelling[  ];  joint erythema[  ];  joint pain[  ];  back pain[  ];  Heme/Lymph: bruising[  ];  bleeding[  ];  anemia[  ];  Neuro: TIA[  ];  headaches[  ];  stroke[  ];  vertigo[  ];  seizures[  ];   paresthesias[  ];  difficulty walking[  ];  Psych:depression[  ]; anxiety[  ];  Endocrine: diabetes[  ];  thyroid dysfunction[   ];  Immunizations: Flu up to date [  ]; Pneumococcal up to date [  ];  Other:     PHYSICAL EXAMINATION: There were no vitals taken for this visit. {physical exam:21449}  Diagnostic Studies & Laboratory data:     Recent Radiology Findings:   No results found.   I have independently reviewed the above radiology studies  and reviewed the findings with the patient.   Recent Lab Findings: Lab Results  Component Value Date   WBC 6.9 10/10/2017   HGB 16.7 10/10/2017  HCT 50.8 10/10/2017   PLT 122 (L) 10/10/2017   GLUCOSE 137 (H) 10/18/2017   CHOL 190 04/01/2012   TRIG (H) 04/01/2012    525.0 Triglyceride is over 400; calculations on Lipids are invalid.   HDL 27.50 (L) 04/01/2012   LDLDIRECT 84.5 04/01/2012   LDLCALC 32 04/03/2010   ALT 31 08/01/2016   AST 21 08/01/2016   NA 140 10/18/2017   K 4.4 10/18/2017   CL 105 10/18/2017   CREATININE 1.79 (H) 10/18/2017   BUN 56 (H) 10/18/2017   CO2 25 10/18/2017   TSH 2.28 04/01/2012   HGBA1C 6.6 (H) 10/10/2017   Chronic Kidney Disease   Stage I     GFR >90  Stage II    GFR 60-89  Stage IIIA GFR 45-59  Stage IIIB GFR 30-44  Stage IV   GFR 15-29  Stage V    GFR  <15  Lab Results  Component Value Date   CREATININE 1.79 (H) 10/18/2017   CrCl cannot be calculated (Patient's most recent lab result is older than the maximum 21 days allowed.).    Assessment / Plan:        I  spent {CHL ONC TIME VISIT - ZTIWP:[id number]} with  the patient face to face and greater then 50% of the time was spent in counseling and coordination of care.    Grace Isaac MD      [address] [place] [address] Office [phone]   Beeper [phone]  12/31/2017 7:10 AM

## 2018-01-01 ENCOUNTER — Ambulatory Visit (HOSPITAL_COMMUNITY)
Admission: RE | Admit: 2018-01-01 | Discharge: 2018-01-01 | Disposition: A | Discharge status: Home / Self Care | Payer: Medicare Other | Source: Ambulatory Visit | Attending: Pulmonary Disease | Admitting: Pulmonary Disease

## 2018-01-01 DIAGNOSIS — K862 Cyst of pancreas: Secondary | ICD-10-CM | POA: Insufficient documentation

## 2018-01-01 DIAGNOSIS — I712 Thoracic aortic aneurysm, without rupture: Secondary | ICD-10-CM | POA: Diagnosis not present

## 2018-01-01 DIAGNOSIS — R911 Solitary pulmonary nodule: Secondary | ICD-10-CM

## 2018-01-01 DIAGNOSIS — N289 Disorder of kidney and ureter, unspecified: Secondary | ICD-10-CM | POA: Diagnosis not present

## 2018-01-01 LAB — GLUCOSE, CAPILLARY: Glucose-Capillary: 121 mg/dL — ABNORMAL HIGH (ref 70–99)

## 2018-01-01 MED ORDER — FLUDEOXYGLUCOSE F - 18 (FDG) INJECTION
9.9600 | Freq: Once | INTRAVENOUS | Status: AC | PRN
  Administered 2018-01-01: 9.96 via INTRAVENOUS

## 2018-01-02 NOTE — Progress Notes (Signed)
[address]       [place] [address]             [phone]                    Harden D Siegrist [place] Medical Record #[id number] Date of Birth: November 25, 1950  Referring: Marshell Garfinkel, MD Primary Care: [name], MD Primary Cardiologist: No primary care provider on file.  Chief Complaint:    Chief Complaint  Patient presents with  . Lung Lesion    Surgical eval, Chest CT 08/06/17, PET Scan 01/01/18, PFT's 01/03/18    History of Present Illness:    Nathan Medina 67 y.o. male is seen in the office  today for evaluation of pulmonary groundglass opacity and right upper lobe,  this was was first noticed on a CT head and neck in March 2018 which showed a groundglass opacity in the right upper lobe.  He had a follow-up CT in March 2019 which showed increase in size, subsequent PET scan showed low uptake.  He was seen by Dr. Verdie Mosher, Pulmonary at [facility] who recommended surveillance.  He is now seen and referred by Dr. [name] repeats PET scan was done yesterday.  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 - TUMOR IS LIMITED TO THYROID. - RESECTION MARGIN IS POSITIVE. - NODULAR HYPERPLASIA. - SEE ONCOLOGY TABLE.  Patient had recent echocardiogram done [facility] cardiovascular showing severe decrease in global wall motion abnormality ejection fraction 35 to 45% no mention of dilated a sending aorta or aortic insufficiency nuclear stress test suggests ejection fraction as low as 15% large area of basal to apical anterior apical septal perfusion defect on stress with moderate reversibility read as a high risk study   Current Activity/ Functional Status:  Patient is  independent with mobility/ambulation, transfers, ADL's, IADL's.   Zubrod Score: At the time of surgery this patient's most appropriate activity status/level should be described as: []     0    Normal activity, no symptoms [x]     1    Restricted in physical strenuous activity but ambulatory, able to do out light work []     2    Ambulatory and capable of self care, unable to do work activities, up and about               >50 % of waking hours                              []     3    Only limited self care, in bed greater than 50% of waking hours []     4    Completely disabled, no self care, confined to bed or chair []     5    Moribund   Past Medical History:  Diagnosis Date  . Abnormal liver function test 05/23/2011  . ALLERGIC RHINITIS   . ANXIETY   . Aortic atherosclerosis ([place])   . Arthritis   . Bilateral renal cysts   . Cervical spondylolysis    Moderate  . Chronic kidney disease    CKD stage 3 per office visit note 08/08/17 on chart   .  COLONIC POLYPS, HX OF   . Complication of anesthesia    slow to wake up   . DEPRESSION   . DIABETES MELLITUS, TYPE II   . Diverticulitis   . Fatty liver   . GERD   . GOUT   . History of inguinal hernia   . HYPERLIPIDEMIA   . HYPERTENSION   . IBS   . 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 Left renal mass 05/23/2011  . Lumbar radicular pain 06/03/2011  . Multinodular goiter   . Pancreatic pseudocyst    Stable  . Peritonsillar abscess   . Pulmonary nodule    Right upper lobe  . Radial neck fracture   . Shortness of breath    occasional shortness of breath   . Thyroid disease   . Thyroid nodule    Bilateral  . Vertigo     Past Surgical History:  Procedure Laterality Date  . APPENDECTOMY  02/12/11  . COLON SURGERY  08/11/10   sigmoid colectomy  . COLON SURGERY  02/12/11   colostomy takedown  . COLONOSCOPY    . HERNIA REPAIR    . INSERTION OF MESH  N/A 08/20/2012   Procedure: INSERTION OF MESH;  Surgeon: [name], MD;  Location: [facility];  Service: General;  Laterality: N/A;  . LYSIS OF ADHESION  08/20/2012   Procedure: LYSIS OF ADHESION;  Surgeon: [name], MD;  Location: [facility];  Service: General;;  . SEPTOPLASTY    . THYROIDECTOMY N/A 10/17/2017   Procedure: TOTAL THYROIDECTOMY;  Surgeon: [name], MD;  Location: [facility];  Service: General;  Laterality: N/A;  . TONSILLECTOMY Right 08/01/2016   Procedure: INCISION AND DRAINAGE RIGHT PERI-TONSILLAR ABSCESS;  Surgeon: [name], MD;  Location: [facility];  Service: ENT;  Laterality: Right;  . VENTRAL HERNIA REPAIR  08/20/2012   Procedure: HERNIA REPAIR VENTRAL ADULT;  Surgeon: [name], MD;  Location: [facility];  Service: General;;    Family History  Problem Relation Age of Onset  . Hypertension Paternal Aunt   . Stroke Maternal Grandmother   . Hypertension Maternal Grandmother      Social History   Tobacco Use  Smoking Status Never Smoker  Smokeless Tobacco Never Used    Social History   Substance and Sexual Activity  Alcohol Use Not Currently     Allergies  Allergen Reactions  . Amoxicillin Other (See Comments)    Reaction:  Hiccups  Has patient had a PCN reaction causing immediate rash, facial/tongue/throat swelling, SOB or lightheadedness with hypotension: No Has patient had a PCN reaction causing severe rash involving mucus membranes or skin necrosis: No Has patient had a PCN reaction that required hospitalization No Has patient had a PCN reaction occurring within the last 10 years: No If all of the above answers are "NO", then may proceed with Cephalosporin use.  . Escitalopram Oxalate Itching  . Ciprofloxacin Rash  . Quinapril Hcl Rash  . Sulfa Antibiotics Swelling    Current Outpatient Medications  Medication Sig Dispense Refill  . acetaminophen (TYLENOL) 325 MG tablet Take 2 tablets (650 mg total) by mouth every 6 (six) hours as needed for  mild pain (or Fever >/= 101). 20 tablet 0  . allopurinol (ZYLOPRIM) 300 MG tablet TAKE 1 TABLET BY MOUTH EVERY DAY 90 tablet 0  . ALPRAZolam (XANAX) 0.5 MG tablet Take 1 tablet (0.5 mg total) by mouth  daily as needed for sleep. 1/2 - 1 by mouth once daily as needed (Patient taking differently: Take 0.25-0.5 mg by mouth at bedtime as needed for sleep. ) 90 tablet 2  . amLODipine (NORVASC) 5 MG tablet Take 5 mg by mouth daily.  1  . aspirin EC 81 MG tablet Take 81 mg by mouth every other day.    . calcium carbonate (TUMS) 500 MG chewable tablet Chew 2 tablets (400 mg of elemental calcium total) by mouth 2 (two) times daily. 90 tablet 1  . Cholecalciferol (VITAMIN D-3) 5000 units TABS Take 5,000 Units by mouth at bedtime.    . diphenhydrAMINE (BENADRYL) 25 MG tablet Take 25 mg by mouth at bedtime as needed for sleep.    . empagliflozin (JARDIANCE) 25 MG TABS tablet Take 25 mg by mouth daily.    . furosemide (LASIX) 40 MG tablet Take 40 mg by mouth daily.    [name] guaiFENesin (MUCINEX) 600 MG 12 hr tablet Take 600 mg by mouth 2 (two) times daily as needed for cough or to loosen phlegm.    . insulin glargine (LANTUS) 100 unit/mL SOPN Inject 48 Units into the skin at bedtime.     [name] levothyroxine (SYNTHROID) 100 MCG tablet Take 1 tablet (100 mcg total) by mouth daily. 30 tablet 3  . MICARDIS 80 MG tablet TAKE 1 TABLET BY MOUTH EVERY DAY 90 tablet 3  . Multiple Vitamins-Minerals (PRESERVISION AREDS 2 PO) Take 1 tablet by mouth 2 (two) times daily.    [name] omega-3 acid ethyl esters (LOVAZA) 1 g capsule Take 1 g by mouth 2 (two) times daily.    [name] omeprazole (PRILOSEC) 20 MG capsule Take 20 mg by mouth daily before breakfast.     . traMADol (ULTRAM) 50 MG tablet Take 1-2 tablets (50-100 mg total) by mouth every 6 (six) hours as needed for moderate pain. 24 tablet 0  . vitamin E (VITAMIN E) 1000 UNIT capsule Take 1,000 Units by mouth at bedtime.     No current facility-administered medications for this visit.      Pertinent items are noted in HPI.   Review of Systems:     Cardiac Review of Systems: [Y] = yes  or   [ N ] = no   Chest Pain [  n  ]  Resting SOB [ n  ] Exertional SOB  [ y ]  Orthopnea [ n ]   Pedal Edema [  n ]    Palpitations [n  ] Syncope  [n  ]   Presyncope [  n ]   General Review of Systems: [Y] = yes [  ]=no Constitional: recent weight change [  ];  Wt loss over the last 3 months [   ] anorexia [  ]; fatigue [  ]; nausea [  ]; night sweats [  ]; fever [  ]; or chills [  ];           Eye : blurred vision [  ]; diplopia [   ]; vision changes [  ];  Amaurosis fugax[  ]; Resp: cough [  ];  wheezing[  ];  hemoptysis[  ]; shortness of breath[  ]; paroxysmal nocturnal dyspnea[  ]; dyspnea on exertion[  ]; or orthopnea[  ];  GI:  gallstones[  ], vomiting[  ];  dysphagia[  ]; melena[  ];  hematochezia [  ]; heartburn[  ];   Hx of  Colonoscopy[  ]; GU: kidney stones [  ];  hematuria[  ];   dysuria Blue.[email or web address]  ];  nocturia[y  ];  history of     obstruction [  ]; urinary frequency Blue.Reese  ]             Skin: rash, swelling[  ];, hair loss[  ];  peripheral edema[  ];  or itching[  ]; Musculosketetal: myalgias[  ];  joint swelling[  ];  joint erythema[  ];  joint pain[y  ];  back pain[  ];  Heme/Lymph: bruising[  ];  bleeding[  ];  anemia[  ];  Neuro: TIA[  ];  headaches[  ];  stroke[  ];  vertigo[  ];  seizures[  ];   paresthesias[  ];  difficulty walking[  ];  Psych:depression[  ]; anxiety[  ];  Endocrine: diabetes[ y ];  thyroid dysfunction[  ];  Immunizations: Flu up to date [ y ]; Pneumococcal up to date [ n ];  Other:    PHYSICAL EXAMINATION: BP 127/77   Pulse 69   Resp 20   Ht 5\' 7"  (1.702 m)   Wt 197 lb (89.4 kg)   SpO2 97% Comment: RA  BMI 30.85 kg/m   General appearance: alert, cooperative and no distress Head: Normocephalic, without obvious abnormality, atraumatic Neck: no adenopathy, no carotid bruit, no JVD, supple, symmetrical, trachea midline and thyroid not enlarged, symmetric,  no tenderness/mass/nodules Lymph nodes: Cervical, supraclavicular, and axillary nodes normal. Resp: clear to auscultation bilaterally Back: symmetric, no curvature. ROM normal. No CVA tenderness. Cardio: regular rate and rhythm, S1, S2 normal, no murmur, click, rub or gallop GI: soft, non-tender; bowel sounds normal; no masses,  no organomegaly Extremities: extremities normal, atraumatic, no cyanosis or edema Neurologic: Grossly normal  Diagnostic Studies & Laboratory data:     Recent Radiology Findings:   Nm Pet Image Initial (pi) Skull Base To Thigh  Result Date: 01/01/2018 CLINICAL DATA:  Subsequent treatment strategy for right lung nodule. EXAM: NUCLEAR MEDICINE PET SKULL BASE TO THIGH TECHNIQUE: 10.0 mCi F-18 FDG was injected intravenously. Full-ring PET imaging was performed from the skull base to thigh after the radiotracer. CT data was obtained and used for attenuation correction and anatomic localization. Fasting blood glucose: 121 mg/dl COMPARISON:  08/26/2017 FINDINGS: (Reference/background mediastinal blood pool activity: SUV max = 2.5) NECK:  No hypermetabolic lymph nodes or masses. Incidental CT findings:  None. CHEST: No hypermetabolic masses or lymphadenopathy. A 1.5 cm ground-glass nodule is seen in the posterior right lung apex which remains stable in size since previous studies. No solid component visualized. This does show mild FDG uptake with SUV of 1.7, without significant change compared to prior study. No new or enlarging pulmonary nodules identified. No evidence of pleural effusion. Incidental CT findings: Aortic and coronary artery atherosclerosis. Stable 4.1 cm ascending thoracic aortic aneurysm. ABDOMEN/PELVIS: No abnormal hypermetabolic activity within the liver, pancreas, adrenal glands, or spleen. No hypermetabolic lymph nodes in the abdomen or pelvis. A subcapsular lesion is seen in the lateral midpole of the left kidney which measures 2.4 cm. This shows FDG uptake with  SUV max 3.4, suspicious for low-grade renal cell carcinoma. A fluid attenuation cystic lesion in the pancreatic tail measures 2.8 x 2.7 cm, and is stable compared to prior CT in 2013. This shows no FDG uptake, and is most consistent with a benign etiology such as an indolent cystic pancreatic neoplasm or pseudocyst. Incidental CT findings: Tiny calcified gallstones noted, without evidence of cholecystitis. Mildly enlarged prostate. SKELETON: No focal hypermetabolic bone lesions  to suggest skeletal metastasis. Incidental CT findings:  None. IMPRESSION: 2.4 cm subcapsular lesion in left kidney with FDG uptake, highly suspicious for renal cell carcinoma. Recommend abdomen MRI without and with contrast for further evaluation. These results will be called to the ordering clinician or representative by the Radiologist Assistant, and communication documented in the PACS or zVision Dashboard. Stable 1.5 cm ground-glass nodule in posterior right lung apex with low-grade FDG uptake. Low-grade adenocarcinoma cannot be excluded. Recommend continued follow-up by chest CT without contrast (rather than PET CT) in 12 months. This recommendation follows the consensus statement: Guidelines for Management of Small Pulmonary Nodules Detected on CT Images: From the Fleischner Society 2017; published online before print (10.1148/radiol.3536144315). Cystic lesion in pancreatic tail shows no FDG uptake and remains stable, consistent with an indolent cystic pancreatic neoplasm or pseudocyst. This can also be assessed by MRI. Stable 4.1 cm ascending thoracic aortic aneurysm. This can also be followed up by chest CT in 12 months. This recommendation follows 2010 ACCF/AHA/AATS/ACR/ASA/SCA/SCAI/SIR/STS/SVM Guidelines for the Diagnosis and Management of Patients with Thoracic Aortic Disease. Circulation. 2010; 121: Q008-Q761. Electronically Signed   By: Earle Gell M.D.   On: 01/01/2018 12:01     I have independently reviewed the above  radiology studies  and reviewed the findings with the patient.   Recent Lab Findings: Lab Results  Component Value Date   WBC 6.9 10/10/2017   HGB 16.7 10/10/2017   HCT 50.8 10/10/2017   PLT 122 (L) 10/10/2017   GLUCOSE 137 (H) 10/18/2017   CHOL 190 04/01/2012   TRIG (H) 04/01/2012    525.0 Triglyceride is over 400; calculations on Lipids are invalid.   HDL 27.50 (L) 04/01/2012   LDLDIRECT 84.5 04/01/2012   LDLCALC 32 04/03/2010   ALT 31 08/01/2016   AST 21 08/01/2016   NA 140 10/18/2017   K 4.4 10/18/2017   CL 105 10/18/2017   CREATININE 1.79 (H) 10/18/2017   BUN 56 (H) 10/18/2017   CO2 25 10/18/2017   TSH 2.28 04/01/2012   HGBA1C 6.6 (H) 10/10/2017   Chronic Kidney Disease   Stage I     GFR >90  Stage II    GFR 60-89  Stage IIIA GFR 45-59  Stage IIIB GFR 30-44  Stage IV   GFR 15-29  Stage V    GFR  <15  Lab Results  Component Value Date   CREATININE 1.79 (H) 10/18/2017   CrCl cannot be calculated (Patient's most recent lab result is older than the maximum 21 days allowed.).  Pulmonary function studies done today Conclusions: The results are within normal limits. Pulmonary Function Diagnosis: Normal Pulmonary Function  FEV1 2.9  89% predicted DLCO 28. 85% predicted Assessment / Plan:   1/Stable 1.5 cm ground-glass nodule in posterior right lung apex with low-grade FDG uptake. Low-grade adenocarcinoma cannot be excluded. 2/2.4 cm subcapsular lesion in left kidney with FDG uptake, highly suspicious for renal cell carcinoma.   3/Stable 4.1 cm ascending thoracic aortic aneurysm. 4/history of papillary carcinoma of the thyroid-with positive resection margin 5 high risk nuclear cardiology study done at [facility] cardiovascular dated 12/21/2010 6 stage III chronic kidney disease   I have seen the patient and reviewed with him various studies including his PET scan done 2 days ago, previous CT scan, as noted above the patient has several new diagnoses currently the  groundglass opacity in the right upper lung zone is of lower priority.  I have suggested to him that he arrange immediate follow-up  with his cardiologist and also to be evaluated by Dr. Pats[facility] concerning the left renal mass.  This appears to have been present on the PET scan done in the spring 2019, though it is not mentioned in the report.  MRI of the abdomen in 2013 was done by urology, the mass in the left kidney has increased in size since then.   Plan to see the patient back in 3 months and will decide on further follow-up for the right upper lobe groundglass opacity.  I reviewed reviewed with him information about his mildly dilated ascending aorta, and that this will be followed as we followed the lung nodule.    I  spent 60 minutes with  the patient face to face and greater then 50% of the time was spent in counseling and coordination of care.    Grace Isaac MD      [address] [place] [address] Office [phone]   Beeper [phone]  01/03/2018 3:03 PM

## 2018-01-03 ENCOUNTER — Institutional Professional Consult (permissible substitution): Payer: Medicare Other | Admitting: Cardiothoracic Surgery

## 2018-01-03 ENCOUNTER — Ambulatory Visit (HOSPITAL_COMMUNITY)
Admission: RE | Admit: 2018-01-03 | Discharge: 2018-01-03 | Disposition: A | Discharge status: Home / Self Care | Payer: Medicare Other | Source: Ambulatory Visit | Attending: Pulmonary Disease | Admitting: Pulmonary Disease

## 2018-01-03 VITALS — BP 127/77 | HR 69 | Resp 20 | Ht 67.0 in | Wt 197.0 lb

## 2018-01-03 DIAGNOSIS — I712 Thoracic aortic aneurysm, without rupture, unspecified: Secondary | ICD-10-CM

## 2018-01-03 DIAGNOSIS — R911 Solitary pulmonary nodule: Secondary | ICD-10-CM

## 2018-01-03 LAB — PULMONARY FUNCTION TEST
DL/VA % pred: 103 %
DL/VA: 4.6 ml/min/mmHg/L
DLCO unc % pred: 85 %
DLCO unc: 24.74 ml/min/mmHg
FEF 25-75 Post: 3.4 L/sec
FEF 25-75 Pre: 3.16 L/sec
FEF2575-%Change-Post: 7 %
FEF2575-%Pred-Post: 144 %
FEF2575-%Pred-Pre: 133 %
FEV1-%Change-Post: 2 %
FEV1-%Pred-Post: 101 %
FEV1-%Pred-Pre: 98 %
FEV1-Post: 3.07 L
FEV1-Pre: 2.98 L
FEV1FVC-%Change-Post: -1 %
FEV1FVC-%Pred-Pre: 112 %
FEV6-%Change-Post: 2 %
FEV6-%Pred-Post: 95 %
FEV6-%Pred-Pre: 92 %
FEV6-Post: 3.68 L
FEV6-Pre: 3.57 L
FEV6FVC-%Change-Post: -1 %
FEV6FVC-%Pred-Post: 104 %
FEV6FVC-%Pred-Pre: 106 %
FVC-%Change-Post: 4 %
FVC-%Pred-Post: 90 %
FVC-%Pred-Pre: 87 %
FVC-Post: 3.73 L
FVC-Pre: 3.57 L
Post FEV1/FVC ratio: 82 %
Post FEV6/FVC ratio: 99 %
Pre FEV1/FVC ratio: 83 %
Pre FEV6/FVC Ratio: 100 %
RV % pred: 125 %
RV: 2.84 L
TLC % pred: 97 %
TLC: 6.36 L

## 2018-01-03 MED ORDER — ALBUTEROL SULFATE (2.5 MG/3ML) 0.083% IN NEBU
2.5000 mg | INHALATION_SOLUTION | Freq: Once | RESPIRATORY_TRACT | Status: AC
  Administered 2018-01-03: 2.5 mg via RESPIRATORY_TRACT

## 2018-01-03 NOTE — Patient Instructions (Addendum)
Ascending Aortic Aneurysm/ Thoracic Aortic Aneurysm   Recent studies have raised concern that fluoroquinolone antibiotics could be associated with an increased risk of aortic aneurysm or aortic dissection. You should avoid use of Cipro and other associated antibiotics (flouroquinolone antibiotics )  It is  best to avoid activities that cause grunting or straining (medically referred to as a "valsalva maneuver"). This happens when a person bears down against a closed throat to increase the strength of arm or abdominal muscles. There's often a tendency to do this when lifting heavy weights, doing sit-ups, push-ups or chin-ups, etc., but it may be harmful.     An aneurysm is a bulge in an artery. It happens when blood pushes up against a weakened or damaged artery wall. A thoracic aortic aneurysm is an aneurysm that occurs in the first part of the aorta, between the heart and the diaphragm. The aorta is the main artery of the body. It supplies blood from the heart to the rest of the body. Some aneurysms may not cause symptoms or problems. However, the major concern with a thoracic aortic aneurysm is that it can enlarge and burst (rupture), or blood can flow between the layers of the wall of the aorta through a tear (aorticdissection). Both of these conditions can cause bleeding inside the body and can be life-threatening if they are not diagnosed and treated right away. What are the causes? The exact cause of this condition is not known. What increases the risk? The following factors may make you more likely to develop this condition:  Being age 65 or older.  Having a hardening of the arteries caused by the buildup of fat and other substances in the lining of a blood vessel (arteriosclerosis).  Having inflammation of the walls of an artery (arteritis).  Having a genetic disease that weakens the body's connective tissue, such as Marfan syndrome.  Having an injury or  trauma to the aorta.  Having an infection that is caused by bacteria, such as syphilis or staphylococcus, in the wall of the aorta (infectious aortitis).  Having high blood pressure (hypertension).  Being male.  Being white (Caucasian).  Having high cholesterol.  Having a family history of aneurysms.  Using tobacco.  Having chronic obstructive pulmonary disease (COPD). What are the signs or symptoms? Symptoms of this condition vary depending on the size and rate of growth of the aneurysm. Most grow slowly and do not cause any symptoms. When symptoms do occur, they may include:  Pain in the chest, back, sides, or abdomen. The pain may vary in intensity. A sudden onset of severe pain may indicate that the aneurysm has ruptured.  Hoarseness.  Cough.  Shortness of breath.  Swallowing problems.  Swelling in the face, arms, or legs.  Fever.  Unexplained weight loss. How is this diagnosed? This condition may be diagnosed with:  An ultrasound.  X-rays.  A CT scan.  An MRI.  Tests to check the arteries for damage or blockages (angiogram). Most unruptured thoracic aortic aneurysms cause no symptoms, so they are often found during exams for other conditions. How is this treated? Treatment for this condition depends on:  The size of the aneurysm.  How fast the aneurysm is growing.  Your age.  Risk factors for rupture. Aneurysms that are smaller than 2.2 inches (5.5 cm) may be managed by using medicines to control blood pressure, manage pain, or fight infection. You may need regular monitoring to see if the aneurysm is getting bigger. Your health care   provider may recommend that you have an ultrasound every year or every 6 months. How often you need to have an ultrasound depends on the size of the aneurysm, how fast it is growing, and whether you have a family history of aneurysms. Surgical repair may be needed if your aneurysm is larger than 2.2 inches or if it is  growing quickly. Follow these instructions at home: Eating and drinking   Eat a healthy diet. Your health care provider may recommend that you:  Lower your salt (sodium) intake. In some people, too much salt can raise blood pressure and increase the risk of thoracic aortic aneurysm.  Avoid foods that are high in saturated fat and cholesterol, such as red meat and dairy.  Eat a diet that is low in sugar.  Increase your fiber intake by including whole grains, vegetables, and fruits in your diet. Eating these foods may help to lower blood pressure.  Limit or avoid alcohol as recommended by your health care provider. Lifestyle   Follow instructions from your health care provider about healthy lifestyle habits. Your health care provider may recommend that you:  Do not use any products that contain nicotine or tobacco, such as cigarettes and e-cigarettes. If you need help quitting, ask your health care provider.  Keep your blood pressure within normal limits. The target limit for most people is below 120/80. Check your blood pressure regularly. If it is high, ask your health care provider about ways that you can control it.  Keep your blood sugar (glucose) level and cholesterol levels within normal limits. Target limits for most people are:  Blood glucose level: Less than 100 mg/dL.  Total cholesterol level: Less than 200 mg/dL.  Maintain a healthy weight. Activity   Stay physically active and exercise regularly. Talk with your health care provider about how often you should exercise and ask which types of exercise are safe for you.  Avoid heavy lifting and activities that take a lot of effort (are strenuous). Ask your health care provider what activities are safe for you. General instructions   Keep all follow-up visits as told by your health care provider. This is important.  Talk with your health care provider about regular screenings to see if the aneurysm is getting  bigger.  Take over-the-counter and prescription medicines only as told by your health care provider. Contact a health care provider if:  You have discomfort in your upper back, neck, or abdomen.  You have trouble swallowing.  You have a cough or hoarseness.  You have a family history of aneurysms.  You have unexplained weight loss. Get help right away if:  You have sudden, severe pain in your upper back and abdomen. This pain may move into your chest and arms.  You have shortness of breath.  You have a fever. This information is not intended to replace advice given to you by your health care provider. Make sure you discuss any questions you have with your health care provider. Document Released: 05/07/2005 Document Revised: 02/17/2016 Document Reviewed: 02/17/2016 Elsevier Interactive Patient Education  2017 [employer].   Aortic Dissection An aortic dissection happens when there is a tear in the main blood vessel of the body (aorta). The aorta comes out of the heart, curves around, and then goes down the chest (thoracic aorta) and into the abdomen (abdominal aorta) to supply arteries with blood. The wall of the aorta has inner and outer layers. Aortic dissection occurs most often in the thoracic aorta. As  the tear widens and blood flows through it, the aorta becomes "double-barreled." This means that one part of the aorta continues to carry blood to the body, but blood also flows into the tear, between the layers of the aorta. The torn part of the aorta fills with blood and swells up. This can reduce blood flow through the part of the aorta that is still supplying blood to the body. Aortic dissection is a medical emergency. What are the causes? An aortic dissection is commonly caused by weakening of the artery wall due to high blood pressure. Other causes may include:  An injury, such as from a car crash.  Birth defects that affect the heart (congenital heart  defects).  Thickening of the artery walls. In some cases, the cause is not known. What increases the risk? The following factors may make you more likely to develop this condition:  Having certain medical conditions, such as:  High blood pressure (hypertension).  Hardening and narrowing of the arteries (atherosclerosis).  A genetic disorder that affects the connective tissue, such as Marfan syndrome or Ehlers-Danlos syndrome.  A condition that causes inflammation of blood vessels, such as giant cell arteritis.  Having a chest injury.  Having surgery on the aorta.  Being born with a congenital heart defect.  Being male.  Being older than age 52.  Using cocaine.  Smoking.  Lifting heavy weights or doing other types of high-intensity resistance training. What are the signs or symptoms? Signs and symptoms of aortic dissection start suddenly. The most common symptoms are:  Severe chest pain that may feel like tearing, stabbing, or sharp pain.  Severe pain that spreads (radiates) to the back, neck, jaw, or abdomen. Other symptoms may include:  Trouble breathing.  Dizziness or fainting.  Sudden weakness on one side of the body.  Nausea or vomiting.  Trouble swallowing.  Coughing up blood.  Vomiting blood.  Clammy skin. How is this diagnosed? This condition may be diagnosed based on:  Your symptoms.  A physical exam. This may include:  Listening for abnormal blood flow sounds (murmurs) in your chest or abdomen.  Checking your pulse in your arms and legs.  Checking your blood pressure to see whether it is low or whether there is a difference between the measurements in your right and left arm.  Electrocardiogram (ECG). This test measures the electrical activity in your heart.  Chest X-ray.  CT scan.  MRI.  Aortic angiogram. This test involves injecting dye to make it easier to see your blood vessels clearly.  Echocardiogram to study your heart  using sound waves.  Blood tests. How is this treated? It is important to treat an aortic dissection as quickly as possible. Treatment may start as soon as your health care provider thinks that you have aortic dissection. Treatment depends on the location and severity of your dissection and your overall health. Treatment may include:  Medicines to lower your blood pressure.  Surgery to repair the dissected part of your aorta with artificial material (syntheticgraft).  A medical procedure to insert a stent-graft into the aorta (endovascular procedure). During this procedure, a long, thin tube (stent) is inserted into an artery near the groin (femoral artery) and moved up to the damaged part of the aorta. Then, the stent is opened to help improve blood flow and prevent future dissection. Follow these instructions at home: Activity   Avoid activities that could injure your chest or your abdomen. Ask your health care provider what activities are  safe for you.  After you have recovered, try to stay active. Ask your health care provider what activities are safe for you after recovery.  Do not lift anything that is heavier than 10 lb (4.5 kg) until your health care provider approves.  Do not drive or use heavy machinery while taking prescription pain medicine. Eating and drinking   Eat a heart-healthy diet, which includes lots of fresh fruits and vegetables, low-fat (lean) protein, and whole grains.  Check ingredients and nutrition facts on packaged foods and beverages, and avoid foods with high amounts of:  Salt (sodium).  Saturated fats (like red meat).  Trans fats (like fried food). General instructions   Take over-the-counter and prescription medicines only as told by your health care provider.  Work with your health care provider to manage your blood pressure.  Talk with your health care provider about how to manage stress.  Do not use any products that contain nicotine or  tobacco, such as cigarettes and e-cigarettes. If you need help quitting, ask your health care provider.  Keep all follow-up visits as told by your health care provider. This is important. Get help right away if:  You develop any symptoms of aortic dissection after treatment, including severe pain in your chest, back, or abdomen.  You have a pain in your abdomen.  You have trouble breathing or you develop a cough.  You faint.  You develop a racing heartbeat. These symptoms may represent a serious problem that is an emergency. Do not wait to see if the symptoms will go away. Get medical help right away. Call your local emergency services (911 in the [place] Do not drive yourself to the hospital. Summary  An aortic dissection happens when there is a tear in the main blood vessel of the body (aorta). It is a medical emergency.  The most common symptom is severe pain in the chest that spreads (radiates) to the back, neck, jaw, or abdomen.  It is important to treat an aortic dissection as quickly as possible. Treatment typically includes surgery and medicines. This information is not intended to replace advice given to you by your health care provider. Make sure you discuss any questions you have with your health care provider. Document Released: 08/14/2007 Document Revised: 03/26/2016 Document Reviewed: 03/26/2016 Elsevier Interactive Patient Education  2017 [employer]    Pulmonary Nodule A pulmonary nodule is a small, round growth of tissue in the lung. Pulmonary nodules can range in size from less than 1/5 inch (4 mm) to a little bigger than an inch (25 mm). Most pulmonary nodules are detected when imaging tests of the lung are being performed for a different problem. Pulmonary nodules are usually not cancerous (benign). However, some pulmonary nodules are cancerous (malignant). Follow-up treatment or testing is based on the size of the pulmonary nodule and your risk of getting lung  cancer. What are the causes? Benign pulmonary nodules can be caused by various things. Some of the causes include: Bacterial, fungal, or viral infections. This is usually an old infection that is no longer active, but it can sometimes be a current, active infection. A benign mass of tissue. Inflammation from conditions such as rheumatoid arthritis. Abnormal blood vessels in the lungs.  Malignant pulmonary nodules can result from lung cancer or from cancers that spread to the lung from other places in the body. What are the signs or symptoms? Pulmonary nodules usually do not cause symptoms. How is this diagnosed? Most often, pulmonary nodules are  found incidentally when an X-ray or CT scan is performed to look for some other problem in the lung area. To help determine whether a pulmonary nodule is benign or malignant, your health care provider will take a medical history and order a variety of tests. Tests done may include: Blood tests. A skin test called a tuberculin test. This test is used to determine if you have been exposed to the germ that causes tuberculosis. Chest X-rays. If possible, a new X-ray may be compared with X-rays you have had in the past. CT scan. This test shows smaller pulmonary nodules more clearly than an X-ray. Positron emission tomography (PET) scan. In this test, a safe amount of a radioactive substance is injected into the bloodstream. Then, the scan takes a picture of the pulmonary nodule. The radioactive substance is eliminated from your body in your urine. Biopsy. A tiny piece of the pulmonary nodule is removed so it can be checked under a microscope.  How is this treated? Pulmonary nodules that are benign normally do not require any treatment because they usually do not cause symptoms or breathing problems. Your health care provider may want to monitor the pulmonary nodule through follow-up CT scans. The frequency of these CT scans will vary based on the size of the  nodule and the risk factors for lung cancer. For example, CT scans will need to be done more frequently if the pulmonary nodule is larger and if you have a history of smoking and a family history of cancer. Further testing or biopsies may be done if any follow-up CT scan shows that the size of the pulmonary nodule has increased. Follow these instructions at home: Only take over-the-counter or prescription medicines as directed by your health care provider. Keep all follow-up appointments with your health care provider. Contact a health care provider if: You have trouble breathing when you are active. You feel sick or unusually tired. You do not feel like eating. You lose weight without trying to. You develop chills or night sweats. Get help right away if: You cannot catch your breath, or you begin wheezing. You cannot stop coughing. You cough up blood. You become dizzy or feel like you are going to pass out. You have sudden chest pain. You have a fever or persistent symptoms for more than 2-3 days. You have a fever and your symptoms suddenly get worse. This information is not intended to replace advice given to you by your health care provider. Make sure you discuss any questions you have with your health care provider. Document Released: 03/04/2009 Document Revised: 10/13/2015 Document Reviewed: 10/27/2012 Elsevier Interactive Patient Education  2017 [employer].

## 2018-01-12 DIAGNOSIS — R9439 Abnormal result of other cardiovascular function study: Secondary | ICD-10-CM | POA: Diagnosis present

## 2018-01-12 NOTE — H&P (Signed)
Nathan Medina 2018/01/22 3:30 PM Location: [facility] Patient #: [id number] DOB: 03/24/1951 Undefined / Language: Cleophus Molt / Race: White Male   History of Present Illness Gwinda Maine FNP-C; 01/01/2018 8:51 AM) Patient words: fu nuc, echo; last OV 12/02/2017.  The patient is a 67 year old male who presents for a follow-up for Abnormal EKG. Patient with past medical history of aortic atherosclerosis, type 2 diabetes, hypertension, GERD, hyperlipidemia, and stage 3 CKD recently evaluated by [place] for abnormal EKG. He underwent nuclear stress testing and echocardiogram and was scheduled for sooner appointment with [place] due findings.  Patient has hypertension, hyperlipidemia, chronic kidney disease being managed by [facility]. He is essentially asymptomatic, but does mention decreased exercise tolerance over the last one year. Underwent CT scan in March 2019 revealing aortic atherosclerosis and thoracic ascending aorta measuring 4.1 cm. Also noted to have small pulmonary nodule and was recently evaluated by Dr. Rolla Etienne who has referred him to CV surgery for evaluation.   Problem List/Past Medical Georgeanna Harrison; 2018-01-22 3:19 PM) GERD (gastroesophageal reflux disease) (K21.9)  Gout (M10.9)  Anxiety (F41.9)  ED (erectile dysfunction) (N52.9)  Hyperlipidemia (E78.5)  Laboratory examination (Z01.89)  Labs 11/26/2017: RBC 5.43, Hct 49.3, Plt 121, CBC otherwise normal. Labs 10/18/2017: Potassium 4.4, creatinine 1.79, EGFR 38, BMP otherwise normal. Diabetic peripheral neuropathy (E11.42)  Arterial duplex 11/19/2017: Normal examination. No evidence of hemodynamically significant peripheral arterial disease. Hypertension, essential, benign (I10)  Echo 04/20/2009: LVEF 60%. No valvular abnormality or wall motion abnormality. Type 2 diabetes mellitus with stage 3 chronic kidney disease, with long-term current use of insulin (E11.22)  Aortic atherosclerosis (I70.0)  CT of chest  08/06/2017: IMPRESSION: 1. Ground-glass appearing lesion in the posterior segment right upper lobe near the apex measures 1.9 x 1.4 x 1.7 cm appear slightly larger compared to 1 year prior. Given enlargement of this lesion, concern for neoplasm is heightened. This lesion may well warrant tissue sampling. At a minimum, PET study to assess degree of metabolic activity would be advisable.  2. No other parenchymal lung lesion evident beyond mild lower lobe bronchiectasis bilaterally.  3. No evident thoracic adenopathy.  4. Prominence of the ascending thoracic aorta, measuring 4.1 x 4.1 cm. Recommend annual imaging followup by CTA or MRA. There is aortic atherosclerosis as well as foci of coronary artery calcification.  5. **An incidental finding of potential clinical significance has been found. Multinodular goiter with dominant left-sided thyroid mass. Consider further evaluation with thyroid ultrasound. If patient is clinically hyperthyroid, consider nuclear medicine thyroid uptake and scan. Aortic Atherosclerosis (ICD10-I70.0). Abnormal EKG (R94.31)  EKG 12/02/2017: Sinus bradycardia at 49 bpm, 1 PVC, left axis deviation, inferior infarct old. Poor R-wave progression, cannot exclude anterior septal infarct old. Diffuse nonspecific T wave abnormality. Abnormal EKG. EKG 04/03/2012: Sinus bradycardia at 55 bpm, left axis deviation, Q waves in lead III and aVF, cannot exclude inferior infarct. Poor R wave progression cannot exclude anterior infarct old. 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 Recommend annual imaging followup by CTA or MRA. There is aortic atherosclerosis as well as foci of coronary artery calcification.  Allergies Georgeanna Harrison; 01/22/2018 3:19 PM) Sulfabenzamide *CHEMICALS*  Itching.  Family History Georgeanna Harrison; 01-22-18 3:19 PM) Mother  Deceased. at age 38; heart related, needed a  paacemaker Father  Deceased. at age 68; unknown, nkhc Sister 1  older;nkhc Brother 70  1/2 Brother - Deceased  Social  History Georgeanna Harrison; 01/08/2018 3:19 PM) Current tobacco use  Never smoker. Alcohol Use  Occasional alcohol use. 1 drink every 6 mths Marital status  Single. Living Situation  Lives alone. Number of Children  0.  Past Surgical History Georgeanna Harrison; 01/08/2018 3:19 PM) Hernia Repair [2014]: Ventral Colostomy  Temporary due to Diverticulitis Thyroidectomy; Total [10/17/2017]: Appendectomy   Medication History Gwinda Maine, FNP-C; 01/01/2018 8:57 AM) Labetalol HCl (200MG Tablet, 1 (one) Tablet Oral twice a day, Taken starting 12/02/2017) Active. (stop Coreg) amLODIPine Besylate (5MG Tablet, 1 (one) Tablet Oral daily, Taken starting 12/02/2017) Active. Allopurinol (300MG Tablet, 1 Oral daily) Active. ALPRAZolam (0.5MG Tablet, Oral as needed) Active. PreserVision AREDS (1 Oral two times daily) Active. Aspirin (81MG Tablet DR, 1 Oral daily) Active. Benadryl (25MG Tablet, Oral as needed) Active. Carvedilol (6.25MG Tablet, 1 Oral two times daily) Discontinued: changed to labetalol. Cyclobenzaprine HCl (5MG Tablet, 1 Oral as needed) Active. Excedrin Extra Strength (250-250-65MG Tablet, 1-2 Oral as needed) Active. Furosemide (40MG Tablet, 1-2 Oral daily) Active. Guaifenesin 1200 (1200MG Tablet ER 12HR, 1 Oral as needed) Active. Ibuprofen (300MG Tablet, Oral as needed) Active. Lantus SoloStar (100UNIT/ML Soln Pen-inj, 40 Units Subcutaneous at bedtime) Active. Levothyroxine Sodium (137MCG Tablet, 1 Oral daily) Active. Omega-3-acid Ethyl Esters (1GM Capsule, 2 Oral two times daily) Active. PriLOSEC OTC (20MG Tablet DR, 1 Oral daily) Active. Sildenafil Citrate (20MG Tablet, 1-5 Oral as needed) Active. traMADol HCl (50MG Tablet, 1 Oral as needed) Active. Triamcinolone Acetonide (Top) (0.1% Cream, External two times daily) Active. Tylenol  Extra Strength (500MG Tablet, 1-2 Oral as needed) Active. Vitamin D3 (5000UNIT Tablet, 1 Oral daily) Active. Vitamin E (400UNIT Tablet, 1 Oral daily) Active. Medications Reconciled (Pt brought list)  Diagnostic Studies History Georgeanna Harrison; 08-Jan-2018 3:18 PM) Nuclear stress test  Lexiscan myoview stress test 12/20/2017: 1. Lexiscan stress test was performed. Exercise capacity was not assessed. Resting blood pressure was 118/86 mmHg, peak effect blood pressure was 106/80 mmHg. Stress symptoms included dizziness. The resting and stress electrocardiogram demonstrated sinus bradycardia, LAHB, possible old anteroseptal infarct, VPC and normal rest repolarization. Stress EKG is non diagnostic for ischemia as it is a pharmacologic stress. 2. The overall quality of the study is good. Left ventricular cavity is noted to be enlarged on the rest and stress studies. Gated SPECT images reveal global decrease in myocardial thickening and wall motion. The left ventricular ejection fraction was calculated estimated to be 15%, although visually appears to be around 30%. Large area of basal to apical anterior, anteroseptal perfusion defect on stress SPECT images with moderate reversibility on rest SPECT images. There is also a small area of fixed perfusion defect in basal inferior/inferolateral myocardium. Findings suggest large area of infarct with moderate superimposed ischemia in LAD territory, and possible small infarct in LCx territory. TID ratio elevated at 1.19. 3. High risk study. Echocardiogram  12/26/2017: Left ventricle cavity is normal in size. Mild concentric hypertrophy of the left ventricle. Severe decrease in global wall motion. Visual EF is 35-40%. Doppler evidence of grade I (impaired) diastolic dysfunction, normal LAP. Moderate mid to distal anteroseptal hypokinesis and distal apical akinesis. 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 Left atrial cavity is mildly  dilated. Mild (Grade I) mitral regurgitation. Mild tricuspid regurgitation. No evidence of pulmonary hypertension.    Review of Systems Gwinda Maine FNP-C; 01/01/2018 8:51 AM) General Not Present- Appetite Loss and Weight Gain. Respiratory Present- Decreased Exercise Tolerance (over the last year). Not Present- Chronic Cough and Wakes up  from Sleep Wheezing or Short of Breath. Cardiovascular Present- Edema and Leg Pain and/or Swelling (right leg lower foot discoloration and pain while lying in bed). Not Present- Chest Pain, Difficulty Breathing Lying Down and Difficulty Breathing On Exertion. Gastrointestinal Not Present- Black, Tarry Stool and Difficulty Swallowing. Musculoskeletal Not Present- Decreased Range of Motion and Muscle Atrophy. Neurological Not Present- Attention Deficit. Psychiatric Not Present- Personality Changes and Suicidal Ideation. Endocrine Not Present- Cold Intolerance and Heat Intolerance. Hematology Not Present- Abnormal Bleeding. All other systems negative  Vitals Georgeanna Harrison; 12/30/2017 3:26 PM) 12/30/2017 3:23 PM Weight: 197.38 lb Height: 67in Body Surface Area: 2.01 m Body Mass Index: 30.91 kg/m  Pulse: 62 (Regular)  P.OX: 97% (Room air) BP: 148/77 (Sitting, Left Arm, Standard)       Physical Exam Gwinda Maine, FNP-C; 01/01/2018 8:56 AM) General Mental Status-Alert. General Appearance-Cooperative and Appears stated age. Build & Nutrition-Moderately built.  Head and Neck Thyroid Gland Characteristics - normal size and consistency and no palpable nodules. Note: surgically absent. surgical scare present.  Chest and Lung Exam Chest and lung exam reveals -quiet, even and easy respiratory effort with no use of accessory muscles, non-tender and on auscultation, normal breath sounds, no adventitious sounds.  Cardiovascular Cardiovascular examination reveals -normal heart sounds, regular rate and rhythm with no murmurs,  carotid auscultation reveals no bruits, abdominal aorta auscultation reveals no bruits and no prominent pulsation, femoral artery auscultation bilaterally reveals normal pulses, no bruits, no thrills, normal pedal pulses bilaterally and no digital clubbing, cyanosis, edema, increased warmth or tenderness.  Abdomen Palpation/Percussion Normal exam - Non Tender and No hepatosplenomegaly.  Neurologic Neurologic evaluation reveals -alert and oriented x 3 with no impairment of recent or remote memory. Motor-Grossly intact without any focal deficits.  Musculoskeletal Global Assessment Left Lower Extremity - no deformities, masses or tenderness, no known fractures. Right Lower Extremity - no deformities, masses or tenderness, no known fractures.   Results Gwinda Maine FNP-C; 01/01/2018 8:57 AM) Procedures  Name Value Date Myocardial perfusion imaging, tomographic (SPECT) (including attenuation correction, qualitative or quantitative wall motion, ejection fraction by first pass or gated technique, additional quantification, when performed); multiple studies, Comments: Lexiscan myoview stress test 12/20/2017: 1. Lexiscan stress test was performed. Exercise capacity was not assessed. Resting blood pressure was 118/86 mmHg, peak effect blood pressure was 106/80 mmHg. Stress symptoms included dizziness. The resting and stress electrocardiogram demonstrated sinus bradycardia, LAHB, possible old anteroseptal infarct, VPC and normal rest repolarization. Stress EKG is non diagnostic for ischemia as it is a pharmacologic stress. 2. The overall quality of the study is good. Left ventricular cavity is noted to be enlarged on the rest and stress studies. Gated SPECT images reveal global decrease in myocardial thickening and wall motion. The left ventricular ejection fraction was calculated estimated to be 15%, although visually appears to be around 30%. Large area of basal to apical anterior,  anteroseptal perfusion defect on stress SPECT images with moderate reversibility on rest SPECT images. There is also a small area of fixed perfusion defect in basal inferior/inferolateral myocardium. Findings suggest large area of infarct with moderate superimposed ischemia in LAD territory, and possible small infarct in LCx territory. TID ratio elevated at 1.19. 3. High risk study.  Performed: 12/20/2017 12:54 PM Echocardiography, transthoracic, real-time with image documentation (2D), includes M-mode recording, when performed, complete, with spectral Doppler echocardiography, and with color flow Doppler echocardiography ([id number]) Comments: Echocardiogram 12/26/2017: Left ventricle cavity is normal in size. Mild concentric hypertrophy of the left ventricle.  Severe decrease in global wall motion. Visual EF is 35-40%. Doppler evidence of grade I (impaired) diastolic dysfunction, normal LAP. Moderate mid to distal anteroseptal hypokinesis and distal apical akinesis. 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  Left atrial cavity is mildly dilated. Mild (Grade I) mitral regurgitation. Mild tricuspid regurgitation. No evidence of pulmonary hypertension.  Performed: 12/26/2017    Assessment & Plan Gwinda Maine FNP-C; 01/01/2018 8:57 AM) Abnormal nuclear stress test (R94.39) Current Plans Started Carvedilol 6.25MG, 1 Tablet twice a day, 30 days starting 12/30/2017, Ref. x1. Local Order: d/c labetolol Discontinued amLODIPine Besylate 5MG (leg swelling). Discontinued Labetalol HCl 200MG (restarted Coreg due LVEF). Abnormal EKG (R94.31) Story: EKG 12/02/2017: Sinus bradycardia at 49 bpm, 1 PVC, left axis deviation, inferior infarct old. Poor R-wave progression, cannot exclude anterior septal infarct old. Diffuse nonspecific T wave abnormality. Abnormal EKG.  EKG 04/03/2012: Sinus bradycardia at 55 bpm, left axis deviation, Q waves in lead III and aVF, cannot exclude inferior infarct.  Poor R wave progression cannot exclude anterior infarct old. Abnormal EKG Impression: Echocardiogram 12/26/2017: Left ventricle cavity is normal in size. Mild concentric hypertrophy of the left ventricle. Severe decrease in global wall motion. Visual EF is 35-40%. Doppler evidence of grade I (impaired) diastolic dysfunction, normal LAP. Moderate mid to distal anteroseptal hypokinesis and distal apical akinesis. 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 Left atrial cavity is mildly dilated. Mild (Grade I) mitral regurgitation. Mild tricuspid regurgitation. No evidence of pulmonary hypertension.  Lexiscan myoview stress test 12/20/2017: 1. Lexiscan stress test was performed. Exercise capacity was not assessed. Resting blood pressure was 118/86 mmHg, peak effect blood pressure was 106/80 mmHg. Stress symptoms included dizziness. The resting and stress electrocardiogram demonstrated sinus bradycardia, LAHB, possible old anteroseptal infarct, VPC and normal rest repolarization. Stress EKG is non diagnostic for ischemia as it is a pharmacologic stress. 2. The overall quality of the study is good. Left ventricular cavity is noted to be enlarged on the rest and stress studies. Gated SPECT images reveal global decrease in myocardial thickening and wall motion. The left ventricular ejection fraction was calculated estimated to be 15%, although visually appears to be around 30%. Large area of basal to apical anterior, anteroseptal perfusion defect on stress SPECT images with moderate reversibility on rest SPECT images. There is also a small area of fixed perfusion defect in basal inferior/inferolateral myocardium. Findings suggest large area of infarct with moderate superimposed ischemia in LAD territory, and possible small infarct in LCx territory. TID ratio elevated at 1.19. 3. High risk study. Hypertension, essential, benign (I10) Story: Echo 04/20/2009: LVEF 60%. No valvular abnormality or  wall motion abnormality. Current Plans Started BiDil 20-37.5MG, 1 (one) Tablet two times a day, #30, 30 days starting 12/30/2017, Ref. x1. [Samples Given] Type 2 diabetes mellitus with stage 3 chronic kidney disease, with long-term current use of insulin (E11.22) Aortic atherosclerosis (I70.0) Story: CT of chest 08/06/2017: IMPRESSION: 1. Ground-glass appearing lesion in the posterior segment right upper lobe near the apex measures 1.9 x 1.4 x 1.7 cm appear slightly larger compared to 1 year prior. Given enlargement of this lesion, concern for neoplasm is heightened. This lesion may well warrant tissue sampling. At a minimum, PET study to assess degree of metabolic activity would be advisable.  2. No other parenchymal lung lesion evident beyond mild lower lobe bronchiectasis bilaterally.  3. No evident thoracic adenopathy.  4. Prominence of the ascending thoracic aorta, measuring 4.1 x 4.1 cm. Recommend annual imaging  followup by CTA or MRA. There is aortic atherosclerosis as well as foci of coronary artery calcification.  5. **An incidental finding of potential clinical significance has been found. Multinodular goiter with dominant left-sided thyroid mass. Consider further evaluation with thyroid ultrasound. If patient is clinically hyperthyroid, consider nuclear medicine thyroid uptake and scan. Aortic Atherosclerosis (ICD10-I70.0). Ascending aorta dilation (Z61.096) Story: CT of chest 08/06/2017: Prominence of the ascending thoracic aorta, measuring 4.1 x 4.1 cm. Recommend annual imaging followup by CTA or MRA. There is aortic atherosclerosis as well as foci of coronary artery calcification. Right upper lobe pulmonary nodule (R91.1) Story: CT chest 08/06/2017- persistent right upper lobe nodule with increase in size compared to 2018 PET scan 08/26/2017- very low metabolic activity in the right upper lobe nodule. SUV 1.3. No other significant uptake Laboratory examination  (Z01.89) Story: Labs 11/26/2017: RBC 5.43, Hct 49.3, Plt 121, CBC otherwise normal.  Labs 10/18/2017: Potassium 4.4, creatinine 1.79, EGFR 38, BMP otherwise normal.  Note:. Recommendation:  67 year old Caucasian male with hypertension, CKD stage III, and 2 diabetes mellitus, seen by me for abnormal EKG. 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   Today, I discussed the results with him, as well as benefits and risks- most important of all that of AKI,of coronary angiogram and options for revascularization. Patient reports two ongoing medical problems that could affect the decision making. He is going to undergo PET scan and see Dr. Servando Snare regarding this, on 08/13. He is also seeing a urologist, Dr Alger Simons regarding a renal cyst that he worried about.  I have given him the copies of stress test and echo report-for him to take it to his appt with Dr. Servando Snare. He is not in ACEi/ARB-probably becasue of his ?worsening renal function. I have made the following changes to his medications:  Stop labetalol, resume coreg 6.25 mg bid. If SBP>140, start Bidil 20-37.5 mg bid. Conitnue lasix.  I will see him back in 4-6 weeks to revisit this discussion. This was a greater than 40 minute office visit with greater than 50% of the time spent with face-to-face encounter with patient and evaluation of complex medical issues, review of external records and coordination of care.  *I have discussed this case with Dr. Virgina Jock and he personally examined the patient and participated in formulating the plan.*  CC: Dr. [name] (PCP); CC: Dr. Marshell Garfinkel [facility]); CC: Dr. Jeneen Rinks [place] (Neph) CC: Dr. Ceasar Mons (CV surgery); Dr. Alger Simons (Urology); CC: Dr. Minette Brine (Endocrinology)    Signed by Gwinda Maine, FNP-C (01/01/2018 8:58 AM)

## 2018-01-12 NOTE — Progress Notes (Signed)
Labs 01/08/2018: Glucose 138. BUB/Cr 30/1.55.  EGFR 53. Na/K 143/3.9 H/H 15/46. MCV 94. Platelets 144.

## 2018-01-14 ENCOUNTER — Observation Stay (HOSPITAL_COMMUNITY)
Admission: RE | Admit: 2018-01-14 | Discharge: 2018-01-15 | Disposition: A | Discharge status: Home / Self Care | Payer: Medicare Other | Source: Ambulatory Visit | Attending: Cardiology | Admitting: Cardiology

## 2018-01-14 ENCOUNTER — Encounter (HOSPITAL_COMMUNITY)
Admission: RE | Disposition: A | Discharge status: Home / Self Care | Payer: Self-pay | Source: Ambulatory Visit | Attending: Cardiology

## 2018-01-14 ENCOUNTER — Other Ambulatory Visit: Payer: Self-pay

## 2018-01-14 DIAGNOSIS — I5022 Chronic systolic (congestive) heart failure: Secondary | ICD-10-CM | POA: Insufficient documentation

## 2018-01-14 DIAGNOSIS — M109 Gout, unspecified: Secondary | ICD-10-CM | POA: Diagnosis not present

## 2018-01-14 DIAGNOSIS — Z933 Colostomy status: Secondary | ICD-10-CM | POA: Diagnosis not present

## 2018-01-14 DIAGNOSIS — I130 Hypertensive heart and chronic kidney disease with heart failure and stage 1 through stage 4 chronic kidney disease, or unspecified chronic kidney disease: Secondary | ICD-10-CM | POA: Diagnosis not present

## 2018-01-14 DIAGNOSIS — E1122 Type 2 diabetes mellitus with diabetic chronic kidney disease: Secondary | ICD-10-CM | POA: Insufficient documentation

## 2018-01-14 DIAGNOSIS — Z9889 Other specified postprocedural states: Secondary | ICD-10-CM

## 2018-01-14 DIAGNOSIS — Z794 Long term (current) use of insulin: Secondary | ICD-10-CM | POA: Diagnosis not present

## 2018-01-14 DIAGNOSIS — Z905 Acquired absence of kidney: Secondary | ICD-10-CM | POA: Insufficient documentation

## 2018-01-14 DIAGNOSIS — R9439 Abnormal result of other cardiovascular function study: Secondary | ICD-10-CM | POA: Diagnosis not present

## 2018-01-14 DIAGNOSIS — Z7982 Long term (current) use of aspirin: Secondary | ICD-10-CM | POA: Insufficient documentation

## 2018-01-14 DIAGNOSIS — Z79890 Hormone replacement therapy (postmenopausal): Secondary | ICD-10-CM | POA: Diagnosis not present

## 2018-01-14 DIAGNOSIS — E1142 Type 2 diabetes mellitus with diabetic polyneuropathy: Secondary | ICD-10-CM | POA: Diagnosis not present

## 2018-01-14 DIAGNOSIS — Z8249 Family history of ischemic heart disease and other diseases of the circulatory system: Secondary | ICD-10-CM | POA: Diagnosis not present

## 2018-01-14 DIAGNOSIS — I2584 Coronary atherosclerosis due to calcified coronary lesion: Secondary | ICD-10-CM | POA: Insufficient documentation

## 2018-01-14 DIAGNOSIS — Z882 Allergy status to sulfonamides status: Secondary | ICD-10-CM | POA: Insufficient documentation

## 2018-01-14 DIAGNOSIS — N183 Chronic kidney disease, stage 3 (moderate): Secondary | ICD-10-CM | POA: Insufficient documentation

## 2018-01-14 DIAGNOSIS — E785 Hyperlipidemia, unspecified: Secondary | ICD-10-CM | POA: Diagnosis not present

## 2018-01-14 DIAGNOSIS — E042 Nontoxic multinodular goiter: Secondary | ICD-10-CM | POA: Insufficient documentation

## 2018-01-14 DIAGNOSIS — E890 Postprocedural hypothyroidism: Secondary | ICD-10-CM

## 2018-01-14 DIAGNOSIS — F419 Anxiety disorder, unspecified: Secondary | ICD-10-CM | POA: Diagnosis not present

## 2018-01-14 DIAGNOSIS — I700 Atherosclerosis of aorta: Secondary | ICD-10-CM | POA: Insufficient documentation

## 2018-01-14 DIAGNOSIS — I2510 Atherosclerotic heart disease of native coronary artery without angina pectoris: Secondary | ICD-10-CM | POA: Diagnosis not present

## 2018-01-14 DIAGNOSIS — N529 Male erectile dysfunction, unspecified: Secondary | ICD-10-CM | POA: Insufficient documentation

## 2018-01-14 HISTORY — PX: LEFT HEART CATH AND CORONARY ANGIOGRAPHY: CATH118249

## 2018-01-14 HISTORY — PX: ULTRASOUND GUIDANCE FOR VASCULAR ACCESS: SHX6516

## 2018-01-14 LAB — GLUCOSE, CAPILLARY
Glucose-Capillary: 118 mg/dL — ABNORMAL HIGH (ref 70–99)
Glucose-Capillary: 159 mg/dL — ABNORMAL HIGH (ref 70–99)
Glucose-Capillary: 153 mg/dL — ABNORMAL HIGH (ref 70–99)

## 2018-01-14 SURGERY — LEFT HEART CATH AND CORONARY ANGIOGRAPHY
Anesthesia: LOCAL

## 2018-01-14 MED ORDER — HEPARIN (PORCINE) IN NACL 1000-0.9 UT/500ML-% IV SOLN
INTRAVENOUS | Status: AC
  Filled 2018-01-14: qty 1000

## 2018-01-14 MED ORDER — SODIUM CHLORIDE 0.9% FLUSH: 3.0000 mL | Freq: Two times a day (BID) | INTRAVENOUS | Status: DC

## 2018-01-14 MED ORDER — SODIUM CHLORIDE 0.9 % IV SOLN
INTRAVENOUS | Status: AC
  Administered 2018-01-14: 09:00:00 via INTRAVENOUS

## 2018-01-14 MED ORDER — ACETAMINOPHEN 325 MG PO TABS: 650.0000 mg | ORAL_TABLET | ORAL | Status: DC | PRN

## 2018-01-14 MED ORDER — IOHEXOL 350 MG/ML SOLN
INTRAVENOUS | Status: DC | PRN
  Administered 2018-01-14: 75 mL via INTRAVENOUS

## 2018-01-14 MED ORDER — HEPARIN SODIUM (PORCINE) 1000 UNIT/ML IJ SOLN
INTRAMUSCULAR | Status: DC | PRN
  Administered 2018-01-14: 2000 [IU] via INTRAVENOUS

## 2018-01-14 MED ORDER — HEPARIN (PORCINE) IN NACL 1000-0.9 UT/500ML-% IV SOLN
INTRAVENOUS | Status: DC | PRN
  Administered 2018-01-14 (×2): 500 mL

## 2018-01-14 MED ORDER — MIDAZOLAM HCL 2 MG/2ML IJ SOLN
INTRAMUSCULAR | Status: AC
  Filled 2018-01-14: qty 2

## 2018-01-14 MED ORDER — SODIUM CHLORIDE 0.9% FLUSH: 3.0000 mL | INTRAVENOUS | Status: DC | PRN

## 2018-01-14 MED ORDER — INSULIN GLARGINE 100 UNIT/ML ~~LOC~~ SOLN
40.0000 [IU] | Freq: Every day | SUBCUTANEOUS | Status: DC
  Administered 2018-01-15: 40 [IU] via SUBCUTANEOUS
  Filled 2018-01-14 (×2): qty 0.4

## 2018-01-14 MED ORDER — SODIUM CHLORIDE 0.9 % IV SOLN: INTRAVENOUS | Status: AC

## 2018-01-14 MED ORDER — FENTANYL CITRATE (PF) 100 MCG/2ML IJ SOLN
INTRAMUSCULAR | Status: AC
  Filled 2018-01-14: qty 2

## 2018-01-14 MED ORDER — ONDANSETRON HCL 4 MG/2ML IJ SOLN: 4.0000 mg | Freq: Four times a day (QID) | INTRAMUSCULAR | Status: DC | PRN

## 2018-01-14 MED ORDER — ASPIRIN 81 MG PO CHEW
81.0000 mg | CHEWABLE_TABLET | ORAL | Status: AC
  Administered 2018-01-14: 81 mg via ORAL
  Filled 2018-01-14: qty 1

## 2018-01-14 MED ORDER — HEPARIN SODIUM (PORCINE) 1000 UNIT/ML IJ SOLN
INTRAMUSCULAR | Status: AC
  Filled 2018-01-14: qty 1

## 2018-01-14 MED ORDER — SODIUM CHLORIDE 0.9 % IV SOLN: 250.0000 mL | INTRAVENOUS | Status: DC | PRN

## 2018-01-14 MED ORDER — FENTANYL CITRATE (PF) 100 MCG/2ML IJ SOLN
INTRAMUSCULAR | Status: DC | PRN
  Administered 2018-01-14: 50 ug via INTRAVENOUS

## 2018-01-14 MED ORDER — LIDOCAINE HCL (PF) 1 % IJ SOLN
INTRAMUSCULAR | Status: AC
  Filled 2018-01-14: qty 30

## 2018-01-14 MED ORDER — VERAPAMIL HCL 2.5 MG/ML IV SOLN
INTRAVENOUS | Status: AC
  Filled 2018-01-14: qty 2

## 2018-01-14 MED ORDER — LIDOCAINE HCL (PF) 1 % IJ SOLN
INTRAMUSCULAR | Status: DC | PRN
  Administered 2018-01-14: 2 mL
  Administered 2018-01-14: 12 mL

## 2018-01-14 MED ORDER — MIDAZOLAM HCL 2 MG/2ML IJ SOLN
INTRAMUSCULAR | Status: DC | PRN
  Administered 2018-01-14: 1 mg via INTRAVENOUS

## 2018-01-14 MED ORDER — VERAPAMIL HCL 2.5 MG/ML IV SOLN
INTRAVENOUS | Status: DC | PRN
  Administered 2018-01-14: 10 mL via INTRA_ARTERIAL

## 2018-01-14 SURGICAL SUPPLY — 18 items
CATH IMPULSE 5F ANG/FL3.5 (CATHETERS) ×3 IMPLANT
CATH INFINITI 5FR AL1 (CATHETERS) ×3 IMPLANT
CATH INFINITI 5FR JL4 (CATHETERS) ×3 IMPLANT
CATH INFINITI JR4 5F (CATHETERS) ×3 IMPLANT
DEVICE CLOSURE MYNXGRIP 5F (Vascular Products) ×3 IMPLANT
DEVICE RAD COMP TR BAND LRG (VASCULAR PRODUCTS) ×3 IMPLANT
GLIDESHEATH SLEND A-KIT 6F 22G (SHEATH) ×3 IMPLANT
GUIDEWIRE INQWIRE 1.5J.035X260 (WIRE) ×2 IMPLANT
INQWIRE 1.5J .035X260CM (WIRE) ×3
KIT HEART LEFT (KITS) ×3 IMPLANT
KIT MICROPUNCTURE NIT STIFF (SHEATH) ×3 IMPLANT
PACK CARDIAC CATHETERIZATION (CUSTOM PROCEDURE TRAY) ×3 IMPLANT
SHEATH PINNACLE 5F 10CM (SHEATH) ×3 IMPLANT
SHEATH PROBE COVER 6X72 (BAG) ×3 IMPLANT
TRANSDUCER W/STOPCOCK (MISCELLANEOUS) ×3 IMPLANT
TUBING CIL FLEX 10 FLL-RA (TUBING) ×3 IMPLANT
WIRE EMERALD 3MM-J .035X150CM (WIRE) ×3 IMPLANT
WIRE HI TORQ VERSACORE-J 145CM (WIRE) ×3 IMPLANT

## 2018-01-14 NOTE — Progress Notes (Signed)
Pt received from outpatient sx.  Pt awake/alert, Groin site good; TR band some blood but per nurse from outpatient- was like that.   V.S taken, telemetry box place.

## 2018-01-14 NOTE — Interval H&P Note (Signed)
History and Physical Interval Note:  01/14/2018 3:17 PM  Nathan Medina  has presented today for surgery, with the diagnosis of abnormal stress  The various methods of treatment have been discussed with the patient and family. After consideration of risks, benefits and other options for treatment, the patient has consented to  Procedure(s): LEFT HEART CATH AND CORONARY ANGIOGRAPHY (N/A) as a surgical intervention .  The patient's history has been reviewed, patient examined, no change in status, stable for surgery.  I have reviewed the patient's chart and labs.  Questions were answered to the patient's satisfaction.    2016/2017 Appropriate Use Criteria for Coronary Revascularization Clinical Presentation: Diabetes Mellitus? Symptom Status? S/P CABG? Antianginal Therapy (# of long-acting drugs)? Results of Non-invasive testing? FFR/iFR results in all diseased vessels? Patient undergoing renal transplant? Patient undergoing percutaneous valve procedure (TAVR, MitraClip, Others)? Symptom Status:  Ischemic Symptoms  Non-invasive Testing:  High risk  If no or indeterminate stress test, FFR/iFR results in all diseased vessels:  N/A  Diabetes Mellitus:  Yes  S/P CABG:  No  Antianginal therapy (number of long-acting drugs):  >=2  Patient undergoing renal transplant:  No  Patient undergoing percutaneous valve procedure:  No    newline 1 Vessel Disease PCI CABG  No proximal LAD involvement, No proximal left dominant LCX involvement A (8); Indication 2 M (6); Indication 2   Proximal left dominant LCX involvement A (8); Indication 5 A (8); Indication 5   Proximal LAD involvement A (8); Indication 5 A (8); Indication 5   newline 2 Vessel Disease  No proximal LAD involvement A (8); Indication 8 A (7); Indication 8   Proximal LAD involvement A (8); Indication 14 A (9); Indication 14   newline 3 Vessel Disease  Low disease complexity (e.g., focal stenoses, SYNTAX <=22) A (7); Indication 19 A (9);  Indication 19   Intermediate or high disease complexity (e.g., SYNTAX >=23) M (6); Indication 23 A (9); Indication 23   newline Left Main Disease  Isolated LMCA disease: ostial or midshaft A (7); Indication 24 A (9); Indication 24   Isolated LMCA disease: bifurcation involvement M (6); Indication 25 A (9); Indication 25   LMCA ostial or midshaft, concurrent low disease burden multivessel disease (e.g., 1-2 additional focal stenoses, SYNTAX <=22) A (7); Indication 26 A (9); Indication 26   LMCA ostial or midshaft, concurrent intermediate or high disease burden multivessel disease (e.g., 1-2 additional bifurcation stenoses, long stenoses, SYNTAX >=23) M (4); Indication 27 A (9); Indication 27   LMCA bifurcation involvement, concurrent low disease burden multivessel disease (e.g., 1-2 additional focal stenoses, SYNTAX <=22) M (6); Indication 28 A (9); Indication 28   LMCA bifurcation involvement, concurrent intermediate or high disease burden multivessel disease (e.g., 1-2 additional bifurcation stenoses, long stenoses, SYNTAX >=23) R (3); Indication 29 A (9); Indication [place]

## 2018-01-14 NOTE — Progress Notes (Deleted)
Incorrect patient. Full note to follow.  [name], MD [facility] Pager: [phone] Office: [phone] If no answer Cell [phone]

## 2018-01-15 ENCOUNTER — Encounter (HOSPITAL_COMMUNITY): Payer: Self-pay | Admitting: Cardiology

## 2018-01-15 DIAGNOSIS — Z9889 Other specified postprocedural states: Secondary | ICD-10-CM

## 2018-01-15 DIAGNOSIS — E890 Postprocedural hypothyroidism: Secondary | ICD-10-CM

## 2018-01-15 DIAGNOSIS — I2510 Atherosclerotic heart disease of native coronary artery without angina pectoris: Secondary | ICD-10-CM

## 2018-01-15 LAB — GLUCOSE, CAPILLARY
Glucose-Capillary: 77 mg/dL (ref 70–99)
Glucose-Capillary: 82 mg/dL (ref 70–99)

## 2018-01-15 NOTE — Discharge Summary (Signed)
Physician Discharge Summary  Patient ID: Nathan Medina MRN: [id number] DOB/AGE: [birth year] 67 y.o.  Admit date: 01/14/2018 Discharge date: 01/15/2018  Admission Diagnoses: Cardiomyopathy Abnormal stress test  Discharge Diagnoses:  Active Problems:   Abnormal stress test   S/P cardiac cath   Coronary artery disease   S/P thyroidectomy   Discharged Condition: stable  Hospital Course:   67 year old Caucasian male with hypertension, CKD stage III, type  2 diabetes mellitus, mildly dilated ascending aorta (4.1 cm) stable lung and renal masses, s/p thyroidectomy for thyroid nodules, now with decreased exercise tolerance, and iscehmic cardiomyopathy.   Coronary angiogram showed severe multivessel disease, with ostial LAD chronic total occlusion.  Details below.  I recommended patient to be seen by Dr. Servando Snare for consideration for coronary artery bypass graft surgery.  Patient was observed overnight.  He had uneventful hospital stay and was discharged this morning.  Consults: Outpatient CVTS consult  Significant Diagnostic Studies:   Echocardiogram 12/26/2017: Left ventricle cavity is normal in size. Mild concentric hypertrophy of the left ventricle. Severe decrease in global wall motion. Visual EF is 35-40%. Doppler evidence of grade I (impaired) diastolic dysfunction, normal LAP. Moderate mid to distal anteroseptal hypokinesis and distal apical akinesis. 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    Left atrial cavity is mildly dilated. Mild (Grade I) mitral regurgitation. Mild tricuspid regurgitation.  No evidence of pulmonary hypertension.  Lexiscan myoview stress test 12/20/2017:  1. Lexiscan stress test was performed. Exercise capacity was not assessed. Resting blood pressure was 118/86 mmHg, peak effect blood pressure was 106/80 mmHg. Stress symptoms included dizziness. The resting and stress electrocardiogram demonstrated sinus bradycardia, LAHB, possible  old anteroseptal infarct, VPC and normal rest repolarization. Stress EKG is non diagnostic for ischemia as it is a pharmacologic stress.  2. The overall quality of the study is good.  Left ventricular cavity is noted to be enlarged on the rest and stress studies.  Gated SPECT images reveal global decrease in myocardial thickening and wall motion.  The left ventricular ejection fraction was calculated estimated to be 15%, although visually appears to be around 30%. Large area of basal to apical anterior, anteroseptal perfusion defect on stress SPECT images with moderate reversibility on rest SPECT images. There is also a small area of fixed perfusion defect in basal inferior/inferolateral myocardium. Findings suggest large area of infarct with moderate superimposed ischemia in LAD territory, and possible small infarct in LCx territory. TID ratio elevated at 1.19.  3. High risk study.  Cath 01/14/2018: LM: Normal LAD: Ostial 100% occluded. 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 RPDA distally occluded.           Good surgical revascularization targets seen.  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 Recommend CVTS evaluation for CABG   Discharge Exam: Blood pressure (!) 148/94, pulse 72, temperature 98.1 F (36.7 C), temperature source Oral, resp. rate 18, height 5\' 7"  (1.702 m), weight 89.1 kg, SpO2 96 %. Physical Exam  Constitutional: He is oriented to person, place, and time. He appears well-developed and well-nourished.  Eyes: Pupils are equal, round, and reactive to light. Conjunctivae are normal.  Neck: No JVD present.  Cardiovascular: Normal rate and normal  heart sounds.  No murmur  heard. Pulmonary/Chest: Breath sounds normal. He has no wheezes.  Abdominal: Bowel sounds are normal.  Musculoskeletal: He exhibits no edema.  Lymphadenopathy:    He has no cervical adenopathy.  Neurological: He is alert and oriented to person, place, and time.  Skin: Skin is warm and dry.  Psychiatric: He has a normal mood and affect.  Nursing note and vitals reviewed.    Disposition: Discharge disposition: 01-Home or Self Care       Discharge Instructions    Diet - low sodium heart healthy   Complete by:  As directed    Increase activity slowly   Complete by:  As directed      Allergies as of 01/15/2018      Reactions   Amoxicillin Other (See Comments)   Reaction:  Hiccups  Has patient had a PCN reaction causing immediate rash, facial/tongue/throat swelling, SOB or lightheadedness with hypotension: No Has patient had a PCN reaction causing severe rash involving mucus membranes or skin necrosis: No Has patient had a PCN reaction that required hospitalization No Has patient had a PCN reaction occurring within the last 10 years: No If all of the above answers are "NO", then may proceed with Cephalosporin use.   Escitalopram Oxalate Itching   Ciprofloxacin Rash   Quinapril Hcl Rash   Sulfa Antibiotics Swelling      Medication List    TAKE these medications   acetaminophen 325 MG tablet Commonly known as:  TYLENOL Take 2 tablets (650 mg total) by mouth every 6 (six) hours as needed for mild pain (or Fever >/= 101).   allopurinol 300 MG tablet Commonly known as:  ZYLOPRIM TAKE 1 TABLET BY MOUTH EVERY DAY   ALPRAZolam 0.5 MG tablet Commonly known as:  XANAX Take 1 tablet (0.5 mg total) by mouth daily as needed for sleep. 1/2 - 1 by mouth once daily as needed What changed:    how much to take  when to take this  additional instructions   aspirin EC 81 MG tablet Take 81 mg by mouth every other day. Notes to patient:  See special instructions    calcium  carbonate 500 MG chewable tablet Commonly known as:  TUMS - dosed in mg elemental calcium Chew 2 tablets (400 mg of elemental calcium total) by mouth 2 (two) times daily. What changed:    when to take this  reasons to take this   carvedilol 6.25 MG tablet Commonly known as:  COREG Take 6.25 mg by mouth 2 (two) times daily with a meal.   diphenhydrAMINE 25 MG tablet Commonly known as:  BENADRYL Take 25 mg by mouth at bedtime as needed for sleep.   furosemide 40 MG tablet Commonly known as:  LASIX Take 40 mg by mouth daily.   guaiFENesin 600 MG 12 hr tablet Commonly known as:  MUCINEX Take 600 mg by mouth 2 (two) times daily as needed for cough or to loosen phlegm.   insulin glargine 100 unit/mL Sopn Commonly known as:  LANTUS Inject 40 Units into the skin at bedtime.   isosorbide-hydrALAZINE 20-37.5 MG tablet Commonly known as:  BIDIL Take 1 tablet by mouth 2 (two) times daily.   levothyroxine 150 MCG tablet Commonly known as:  SYNTHROID, LEVOTHROID Take 150 mcg by mouth daily before breakfast.   omega-3 acid ethyl esters 1 g capsule Commonly known as:  LOVAZA Take 1 g by mouth 2 (two) times daily.   omeprazole 20 MG capsule  Commonly known as:  PRILOSEC Take 20 mg by mouth daily before breakfast.   PRESERVISION AREDS 2 PO Take 1 tablet by mouth 2 (two) times daily.   traMADol 50 MG tablet Commonly known as:  ULTRAM Take 1-2 tablets (50-100 mg total) by mouth every 6 (six) hours as needed for moderate pain.   triamcinolone cream 0.1 % Commonly known as:  KENALOG Apply 1 application topically 2 (two) times daily as needed (skin irritation).   Vitamin D-3 5000 units Tabs Take 5,000 Units by mouth at bedtime.   vitamin E 1000 UNIT capsule Generic drug:  vitamin E Take 1,000 Units by mouth at bedtime.      Follow-up Information    [name], MD. Go on 01/21/2018.   Specialty:  Internal Medicine Why:  @1pm  Contact information: [address]  [place] [address] [phone]           Signed: [name] 01/15/2018, 3:18 PM  [place], MD [facility] Pager: [phone] Office: [phone] If no answer Cell [phone]

## 2018-01-15 NOTE — Plan of Care (Signed)
  Problem: Education: Goal: Knowledge of General Education information will improve Description Including pain rating scale, medication(s)/side effects and non-pharmacologic comfort measures Outcome: Progressing   Problem: Activity: Goal: Risk for activity intolerance will decrease Outcome: Progressing   Problem: Coping: Goal: Level of anxiety will decrease Outcome: Progressing   Problem: Pain Managment: Goal: General experience of comfort will improve Outcome: Progressing   Problem: Education: Goal: Understanding of CV disease, CV risk reduction, and recovery process will improve Outcome: Progressing   Problem: Activity: Goal: Ability to return to baseline activity level will improve Outcome: Progressing

## 2018-01-27 ENCOUNTER — Ambulatory Visit: Payer: Medicare Other | Admitting: Pulmonary Disease

## 2018-01-27 ENCOUNTER — Encounter: Payer: Self-pay | Admitting: Pulmonary Disease

## 2018-01-27 VITALS — BP 128/82 | HR 54 | Ht 67.0 in | Wt 200.8 lb

## 2018-01-27 DIAGNOSIS — R911 Solitary pulmonary nodule: Secondary | ICD-10-CM | POA: Diagnosis not present

## 2018-01-27 NOTE — Patient Instructions (Signed)
I reviewed your pulmonary function test which shows normal lung function Please follow-up with Dr. Servando Snare to determine further steps and also to see if he would want to proceed with the bypass surgery I will follow back with you in 6 months.

## 2018-01-27 NOTE — Progress Notes (Signed)
Nathan Medina    [id number]    1950/09/15  Primary Care Physician:Pharr, Thayer Jew, MD  Referring Physician: [name], MD [address] [place], [place] [address]  Chief complaint: Follow-up for pulmonary nodule.  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  This was first noticed on a CT head and neck in March 2018 which showed a groundglass opacity in the right upper lobe.  He had a follow-up CT in March 2019 which showed increase in size, subsequent PET scan showed low uptake.  He was seen by Dr. 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  He has recently seen a cardiologist and is scheduled for cardiac stress test.  Pets: Used to have a cat, no pets currently Occupation: Retired Scientist, physiological for [employer] Exposures: No known exposures, no mold, hot tub, Jacuzzi Smoking history: No smoking history, no secondhand smoke Travel history: Grew up in [place].  Previously lived in [place].  No significant recent travel  Interim history: Mr. Nathan Medina has been dealing with with multiple medical issues PET scan shows low uptake in the lung nodule, there is a lesion in the kidney that is suspicious for malignancy He is followed up with Dr. Junious Silk, Nephrology who feels that we can continue to observe the lesion since it is growing slowly.  Recommend treating the mass (cryo or partial nephrectomy) in the coming years when his cardiac status is stable when the mass gets to 3-4 cm. He is safe to undergo coronary revascularization from a urology point of view  Underwent cardiac cath by Dr. Virgina Jock, cardiology which showed obstructive coronary artery disease, Recommended cardiothoracic evaluation for CABG  Returns to clinic to  discuss his PFTs which are normal with no lung impairements.  States that breathing is doing well with no complaints.  He is scheduled to see Dr. Servando Snare tomorrow.  Physical Exam: Blood pressure 128/82, pulse (!) 54, height 5\' 7"  (1.702 m), weight 200 lb 12.8 oz (91.1 kg), SpO2 97 %. Gen:      No acute distress HEENT:  EOMI, sclera anicteric Neck:     No masses; no thyromegaly Lungs:    Clear to auscultation bilaterally; normal respiratory effort CV:         Regular rate and rhythm; no murmurs Abd:      + bowel sounds; soft, non-tender; no palpable masses, no distension Ext:    No edema; adequate peripheral perfusion Skin:      Warm and dry; no rash Neuro: alert and oriented x 3 Psych: normal mood and affect  Data Reviewed: Imaging CT head and neck 07/31/2016- with thyroid nodules, 12 mm groundglass nodule in the right lung apex. CT chest 08/06/2017- persistent right upper lobe nodule with increase in size compared to 2018 PET scan 08/26/2017- very low metabolic activity in the right upper lobe nodule.  SUV 1.3.  No other significant uptake PET scan 01/01/2018- with low metabolic activity in the right upper lobe.  SUV 1.7.  2.4 subscapular region in the left kidney with FDG uptake suspicious for renal carcinoma. I reviewed the images personally.  PFTs 01/03/2018 FVC 3.73 [90%], FEV1 3.07 [101%), F/F 82, TLC 97%, DLCO 85% Pulmonary function test.  Assessment:  Right upper lobe lung nodule Low uptake on PET scan.  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  I am not sure the nodule resection can be done at the same time as the CABG.  Will defer to Dr. Servando Snare. If not resected then follow-up CT in 1 year.  PFTs reviewed with normal lung function.  He would be able to tolerate surgery without any significant risk of resp issues.  Coronary artery disease Being evaluated for bypass surgery.  Kidney tumor Followed by urology for  slow-growing tumor of the kidney.  No acute intervention needed now  Plan/Recommendations:  - Cardiothoracic surgery follow up  Return in 6 months   Outpatient Encounter Medications as of 01/27/2018  Medication Sig  . acetaminophen (TYLENOL) 325 MG tablet Take 2 tablets (650 mg total) by mouth every 6 (six) hours as needed for mild pain (or Fever >/= 101).  [name] allopurinol (ZYLOPRIM) 300 MG tablet TAKE 1 TABLET BY MOUTH EVERY DAY (Patient taking differently: Take 300 mg by mouth daily. )  . ALPRAZolam (XANAX) 0.5 MG tablet Take 1 tablet (0.5 mg total) by mouth daily as needed for sleep. 1/2 - 1 by mouth once daily as needed (Patient taking differently: Take 0.25-0.5 mg by mouth at bedtime as needed for sleep. )  . aspirin EC 81 MG tablet Take 81 mg by mouth every other day.  . calcium carbonate (TUMS) 500 MG chewable tablet Chew 2 tablets (400 mg of elemental calcium total) by mouth 2 (two) times daily. (Patient taking differently: Chew 2 tablets by mouth 2 (two) times daily as needed for indigestion. )  . carvedilol (COREG) 6.25 MG tablet Take 6.25 mg by mouth 2 (two) times daily with a meal.  . Cholecalciferol (VITAMIN D-3) 5000 units TABS Take 5,000 Units by mouth at bedtime.  . diphenhydrAMINE (BENADRYL) 25 MG tablet Take 25 mg by mouth at bedtime as needed for sleep.  . furosemide (LASIX) 40 MG tablet Take 40 mg by mouth daily.  [name] guaiFENesin (MUCINEX) 600 MG 12 hr tablet Take 600 mg by mouth 2 (two) times daily as needed for cough or to loosen phlegm.  . insulin glargine (LANTUS) 100 unit/mL SOPN Inject 40 Units into the skin at bedtime.   . isosorbide-hydrALAZINE (BIDIL) 20-37.5 MG tablet Take 1 tablet by mouth 2 (two) times daily.  [name] levothyroxine (SYNTHROID, LEVOTHROID) 150 MCG tablet Take 150 mcg by mouth daily before breakfast.  . Multiple Vitamins-Minerals (PRESERVISION AREDS 2 PO) Take 1 tablet by mouth 2 (two) times daily.  [name] omega-3 acid ethyl esters (LOVAZA) 1 g capsule Take 1 g by  mouth 2 (two) times daily.  [name] omeprazole (PRILOSEC) 20 MG capsule Take 20 mg by mouth daily before breakfast.   . traMADol (ULTRAM) 50 MG tablet Take 1-2 tablets (50-100 mg total) by mouth every 6 (six) hours as needed for moderate pain.  [name] triamcinolone cream (KENALOG) 0.1 % Apply 1 application topically 2 (two) times daily as needed (skin irritation).  . vitamin E (VITAMIN E) 1000 UNIT capsule Take 1,000 Units by mouth at bedtime.   No facility-administered encounter medications on file as of 01/27/2018.     Allergies as of 01/27/2018 - Review Complete 01/27/2018  Allergen Reaction Noted  . Amoxicillin Other (See Comments) 07/31/2016  . Escitalopram oxalate Itching 06/25/2007  . Ciprofloxacin Rash 06/01/2011  . Quinapril hcl Rash 06/25/2007  . Sulfa antibiotics Swelling 11/23/2010    Past Medical History:  Diagnosis Date  . Abnormal liver function test 05/23/2011  . ALLERGIC RHINITIS   .  ANXIETY   . Aortic atherosclerosis ([place])   . Arthritis   . Bilateral renal cysts   . Cervical spondylolysis    Moderate  . Chronic kidney disease    CKD stage 3 per office visit note 08/08/17 on chart   . COLONIC POLYPS, HX OF   . Complication of anesthesia    slow to wake up   . DEPRESSION   . DIABETES MELLITUS, TYPE II   . Diverticulitis   . Fatty liver   . GERD   . GOUT   . History of inguinal hernia   . HYPERLIPIDEMIA   . HYPERTENSION   . IBS   . 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 Left renal mass 05/23/2011  . Lumbar radicular pain 06/03/2011  . Multinodular goiter   . Pancreatic pseudocyst    Stable  . Peritonsillar abscess   . Pulmonary nodule    Right upper lobe  . Radial neck fracture   . Shortness of breath    occasional shortness of breath   . Thyroid disease   . Thyroid nodule    Bilateral  . Vertigo     Past Surgical History:  Procedure Laterality Date  . APPENDECTOMY   02/12/11  . COLON SURGERY  08/11/10   sigmoid colectomy  . COLON SURGERY  02/12/11   colostomy takedown  . COLONOSCOPY    . HERNIA REPAIR    . INSERTION OF MESH N/A 08/20/2012   Procedure: INSERTION OF MESH;  Surgeon: [name], MD;  Location: [facility];  Service: General;  Laterality: N/A;  . LEFT HEART CATH AND CORONARY ANGIOGRAPHY N/A 01/14/2018   Procedure: LEFT HEART CATH AND CORONARY ANGIOGRAPHY;  Surgeon: [name], MD;  Location: [facility];  Service: Cardiovascular;  Laterality: N/A;  . LYSIS OF ADHESION  08/20/2012   Procedure: LYSIS OF ADHESION;  Surgeon: [name], MD;  Location: [facility];  Service: General;;  . SEPTOPLASTY    . THYROIDECTOMY N/A 10/17/2017   Procedure: TOTAL THYROIDECTOMY;  Surgeon: [name], MD;  Location: [facility];  Service: General;  Laterality: N/A;  . TONSILLECTOMY Right 08/01/2016   Procedure: INCISION AND DRAINAGE RIGHT PERI-TONSILLAR ABSCESS;  Surgeon: [name], MD;  Location: [facility];  Service: ENT;  Laterality: Right;  . ULTRASOUND GUIDANCE FOR VASCULAR ACCESS  01/14/2018   Procedure: Ultrasound Guidance For Vascular Access;  Surgeon: [name], MD;  Location: [facility];  Service: Cardiovascular;;  . VENTRAL HERNIA REPAIR  08/20/2012   Procedure: HERNIA REPAIR VENTRAL ADULT;  Surgeon: [name], MD;  Location: [facility];  Service: General;;    Family History  Problem Relation Age of Onset  . Hypertension Paternal Aunt   . Stroke Maternal Grandmother   . Hypertension Maternal Grandmother     Social History   Socioeconomic History  . Marital status: Single    Spouse name: Not on file  . Number of children: Not on file  . Years of education: Not on file  . Highest education level: Not on file  Occupational History  . Not on file  Social Needs  . Financial resource strain: Not on file  . Food insecurity:    Worry: Not on file    Inability: Not on file  . Transportation needs:    Medical: Not  on file    Non-medical: Not on file  Tobacco Use  .  Smoking status: Never Smoker  . Smokeless tobacco: Never Used  Substance and Sexual Activity  . Alcohol use: Not Currently  . Drug use: No  . Sexual activity: Not on file  Lifestyle  . Physical activity:    Days per week: Not on file    Minutes per session: Not on file  . Stress: Not on file  Relationships  . Social connections:    Talks on phone: Not on file    Gets together: Not on file    Attends religious service: Not on file    Active member of club or organization: Not on file    Attends meetings of clubs or organizations: Not on file    Relationship status: Not on file  . Intimate partner violence:    Fear of current or ex partner: Not on file    Emotionally abused: Not on file    Physically abused: Not on file    Forced sexual activity: Not on file  Other Topics Concern  . Not on file  Social History Narrative  . Not on file    Review of systems: Review of Systems  Constitutional: Negative for fever and chills.  HENT: Negative.   Eyes: Negative for blurred vision.  Respiratory: as per HPI  Cardiovascular: Negative for chest pain and palpitations.  Gastrointestinal: Negative for vomiting, diarrhea, blood per rectum. Genitourinary: Negative for dysuria, urgency, frequency and hematuria.  Musculoskeletal: Negative for myalgias, back pain and joint pain.  Skin: Negative for itching and rash.  Neurological: Negative for dizziness, tremors, focal weakness, seizures and loss of consciousness.  Endo/Heme/Allergies: Negative for environmental allergies.  Psychiatric/Behavioral: Negative for depression, suicidal ideas and hallucinations.  All other systems reviewed and are negative.   Marshell Garfinkel MD [facility] 01/27/2018, 12:11 PM  CC: [name], MD

## 2018-01-28 ENCOUNTER — Ambulatory Visit: Payer: Medicare Other | Admitting: Cardiothoracic Surgery

## 2018-01-28 ENCOUNTER — Other Ambulatory Visit: Payer: Self-pay

## 2018-01-28 ENCOUNTER — Encounter: Payer: Self-pay | Admitting: Cardiothoracic Surgery

## 2018-01-28 VITALS — BP 135/76 | HR 57 | Resp 16 | Ht 67.0 in | Wt 200.0 lb

## 2018-01-28 DIAGNOSIS — I2510 Atherosclerotic heart disease of native coronary artery without angina pectoris: Secondary | ICD-10-CM | POA: Diagnosis not present

## 2018-01-28 NOTE — Progress Notes (Signed)
[name]       [place] [address]             [phone]                    [name] [place] Medical Record #[id number] Date of Birth: 1951-05-11  Referring: Marshell Garfinkel, MD Primary Care: [name], MD Primary Cardiologist: No primary care provider on file.  Chief Complaint:    Chief Complaint  Patient presents with  . Coronary Artery Disease    eval for CABG...F/U AFTER BEING EVALUATED REGARDING LEFT RENAL LESION...he did not see DR. Junious Silk, but his doctors talked with him and it was decided that survellance scans would be done.    History of Present Illness:    Nathan Medina 67 y.o. male previously seen in the office  today for evaluation of pulmonary groundglass opacity and right upper lobe,  this was was first noticed on a CT head and neck in March 2018 which showed a groundglass opacity in the right upper lobe.  He had a follow-up CT in March 2019 which showed increase in size, subsequent PET scan showed low uptake.  He was seen by Dr. Verdie Mosher, Pulmonary at [facility] who recommended surveillance.  He is now seen and referred by Dr. [name] repeats PET scan .    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  Since last seen cardiac catheterization has been performed and he has seen nephrology concerning the renal mass.    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 - TUMOR IS LIMITED TO THYROID. - RESECTION MARGIN IS POSITIVE. - NODULAR HYPERPLASIA. - SEE ONCOLOGY TABLE.  Patient had recent echocardiogram done at [facility] cardiovascular showing severe decrease in global wall motion abnormality ejection fraction 35 to 45% no mention of dilated a sending aorta or aortic insufficiency nuclear stress test suggests ejection fraction as low as 15% large area of basal to apical anterior apical septal  perfusion defect on stress with moderate reversibility read as a high risk study   Current Activity/ Functional Status:  Patient is independent with mobility/ambulation, transfers, ADL's, IADL's.   Zubrod Score: At the time of surgery this patient's most appropriate activity status/level should be described as: []     0    Normal activity, no symptoms [x]     1    Restricted in physical strenuous activity but ambulatory, able to do out light work []     2    Ambulatory and capable of self care, unable to do work activities, up and about               >50 % of waking hours                              []     3    Only limited self care, in bed greater than 50% of waking hours []     4    Completely disabled, no self care, confined to bed or chair []     5    Moribund   Past Medical History:  Diagnosis Date  . Abnormal liver function test 05/23/2011  . ALLERGIC RHINITIS   . ANXIETY   . Aortic atherosclerosis ([place])   . Arthritis   . Bilateral renal cysts   . Cervical spondylolysis  Moderate  . Chronic kidney disease    CKD stage 3 per office visit note 08/08/17 on chart   . COLONIC POLYPS, HX OF   . Complication of anesthesia    slow to wake up   . DEPRESSION   . DIABETES MELLITUS, TYPE II   . Diverticulitis   . Fatty liver   . GERD   . GOUT   . History of inguinal hernia   . HYPERLIPIDEMIA   . HYPERTENSION   . IBS   . 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 Left renal mass 05/23/2011  . Lumbar radicular pain 06/03/2011  . Multinodular goiter   . Pancreatic pseudocyst    Stable  . Peritonsillar abscess   . Pulmonary nodule    Right upper lobe  . Radial neck fracture   . Shortness of breath    occasional shortness of breath   . Thyroid disease   . Thyroid nodule    Bilateral  . Vertigo     Past Surgical History:  Procedure Laterality Date  . APPENDECTOMY  02/12/11  . COLON SURGERY   08/11/10   sigmoid colectomy  . COLON SURGERY  02/12/11   colostomy takedown  . COLONOSCOPY    . HERNIA REPAIR    . INSERTION OF MESH N/A 08/20/2012   Procedure: INSERTION OF MESH;  Surgeon: [name], MD;  Location: [facility];  Service: General;  Laterality: N/A;  . LEFT HEART CATH AND CORONARY ANGIOGRAPHY N/A 01/14/2018   Procedure: LEFT HEART CATH AND CORONARY ANGIOGRAPHY;  Surgeon: [name], MD;  Location: [facility];  Service: Cardiovascular;  Laterality: N/A;  . LYSIS OF ADHESION  08/20/2012   Procedure: LYSIS OF ADHESION;  Surgeon: [name], MD;  Location: [facility];  Service: General;;  . SEPTOPLASTY    . THYROIDECTOMY N/A 10/17/2017   Procedure: TOTAL THYROIDECTOMY;  Surgeon: [name], MD;  Location: [facility];  Service: General;  Laterality: N/A;  . TONSILLECTOMY Right 08/01/2016   Procedure: INCISION AND DRAINAGE RIGHT PERI-TONSILLAR ABSCESS;  Surgeon: [name], MD;  Location: [facility];  Service: ENT;  Laterality: Right;  . ULTRASOUND GUIDANCE FOR VASCULAR ACCESS  01/14/2018   Procedure: Ultrasound Guidance For Vascular Access;  Surgeon: [name], MD;  Location: [facility];  Service: Cardiovascular;;  . VENTRAL HERNIA REPAIR  08/20/2012   Procedure: HERNIA REPAIR VENTRAL ADULT;  Surgeon: [name], MD;  Location: [facility];  Service: General;;    Family History  Problem Relation Age of Onset  . Hypertension Paternal Aunt   . Stroke Maternal Grandmother   . Hypertension Maternal Grandmother      Social History   Tobacco Use  Smoking Status Never Smoker  Smokeless Tobacco Never Used    Social History   Substance and Sexual Activity  Alcohol Use Not Currently     Allergies  Allergen Reactions  . Amoxicillin Other (See Comments)    Reaction:  Hiccups  Has patient had a PCN reaction causing immediate rash, facial/tongue/throat swelling, SOB or lightheadedness with hypotension: No Has patient had a PCN reaction causing  severe rash involving mucus membranes or skin necrosis: No Has patient had a PCN reaction that required hospitalization No Has patient had a PCN reaction occurring within the last 10 years: No If all of the above answers are "NO", then may proceed with Cephalosporin use.  [name]  Escitalopram Oxalate Itching  . Ciprofloxacin Rash  . Quinapril Hcl Rash  . Sulfa Antibiotics Swelling    Current Outpatient Medications  Medication Sig Dispense Refill  . acetaminophen (TYLENOL) 325 MG tablet Take 2 tablets (650 mg total) by mouth every 6 (six) hours as needed for mild pain (or Fever >/= 101). 20 tablet 0  . allopurinol (ZYLOPRIM) 300 MG tablet TAKE 1 TABLET BY MOUTH EVERY DAY (Patient taking differently: Take 300 mg by mouth daily. ) 90 tablet 0  . ALPRAZolam (XANAX) 0.5 MG tablet Take 1 tablet (0.5 mg total) by mouth daily as needed for sleep. 1/2 - 1 by mouth once daily as needed (Patient taking differently: Take 0.25-0.5 mg by mouth at bedtime as needed for sleep. ) 90 tablet 2  . aspirin EC 81 MG tablet Take 81 mg by mouth every other day.    . calcium carbonate (TUMS) 500 MG chewable tablet Chew 2 tablets (400 mg of elemental calcium total) by mouth 2 (two) times daily. (Patient taking differently: Chew 2 tablets by mouth 2 (two) times daily as needed for indigestion. ) 90 tablet 1  . carvedilol (COREG) 6.25 MG tablet Take 6.25 mg by mouth 2 (two) times daily with a meal.    . Cholecalciferol (VITAMIN D-3) 5000 units TABS Take 5,000 Units by mouth at bedtime.    . diphenhydrAMINE (BENADRYL) 25 MG tablet Take 25 mg by mouth at bedtime as needed for sleep.    . furosemide (LASIX) 40 MG tablet Take 40 mg by mouth daily.    [name] guaiFENesin (MUCINEX) 600 MG 12 hr tablet Take 600 mg by mouth 2 (two) times daily as needed for cough or to loosen phlegm.    . insulin glargine (LANTUS) 100 unit/mL SOPN Inject 40 Units into the skin at bedtime.     . isosorbide-hydrALAZINE (BIDIL) 20-37.5 MG tablet Take 1 tablet  by mouth 2 (two) times daily.    [name] levothyroxine (SYNTHROID, LEVOTHROID) 150 MCG tablet Take 150 mcg by mouth daily before breakfast.    . Multiple Vitamins-Minerals (PRESERVISION AREDS 2 PO) Take 1 tablet by mouth 2 (two) times daily.    [name] omega-3 acid ethyl esters (LOVAZA) 1 g capsule Take 1 g by mouth 2 (two) times daily.    [name] omeprazole (PRILOSEC) 20 MG capsule Take 20 mg by mouth daily before breakfast.     . traMADol (ULTRAM) 50 MG tablet Take 1-2 tablets (50-100 mg total) by mouth every 6 (six) hours as needed for moderate pain. 24 tablet 0  . triamcinolone cream (KENALOG) 0.1 % Apply 1 application topically 2 (two) times daily as needed (skin irritation).    . vitamin E (VITAMIN E) 1000 UNIT capsule Take 1,000 Units by mouth at bedtime.     No current facility-administered medications for this visit.     Pertinent items are noted in HPI.   Review of Systems:     Cardiac Review of Systems: [Y] = yes  or   [ N ] = no   Chest Pain [  N]  Resting SOB [ N ] Exertional SOB  [ Y ]  Orthopnea [ N ]   Pedal Edema [  N ]    Palpitations Aqua.Slicker ] Syncope  Aqua.Slicker  ]   Presyncope [  N ]   General Review of Systems: [Y] = yes [  ]=no Constitional: recent weight change [  ];  Wt loss over the last 3 months [   ]  anorexia [  ]; fatigue [  ]; nausea [  ]; night sweats [  ]; fever [  ]; or chills [  ];           Eye : blurred vision [  ]; diplopia [   ]; vision changes [  ];  Amaurosis fugax[  ]; Resp: cough [  ];  wheezing[  ];  hemoptysis[  ]; shortness of breath[  ]; paroxysmal nocturnal dyspnea[  ]; dyspnea on exertion[  ]; or orthopnea[  ];  GI:  gallstones[  ], vomiting[  ];  dysphagia[  ]; melena[  ];  hematochezia [  ]; heartburn[  ];   Hx of  Colonoscopy[  ]; GU: kidney stones [  ]; hematuria[  ];   dysuria Blue.Reese  ];  nocturia[y  ];  history of     obstruction [  ]; urinary frequency Blue.Reese  ]             Skin: rash, swelling[  ];, hair loss[  ];  peripheral edema[  ];  or itching[  ]; Musculosketetal:  myalgias[  ];  joint swelling[  ];  joint erythema[  ];  joint pain[y  ];  back pain[  ];  Heme/Lymph: bruising[  ];  bleeding[  ];  anemia[  ];  Neuro: TIA[  ];  headaches[  ];  stroke[  ];  vertigo[  ];  seizures[  ];   paresthesias[  ];  difficulty walking[  ];  Psych:depression[  ]; anxiety[  ];  Endocrine: diabetes[ y ];  thyroid dysfunction[  ];  Immunizations: Flu up to date [ Y ]; Pneumococcal up to date [facility] ];  Other:    PHYSICAL EXAMINATION: BP 135/76 (BP Location: Left Arm, Patient Position: Sitting, Cuff Size: Large)   Pulse (!) 57   Resp 16   Ht 5\' 7"  (1.702 m)   Wt 200 lb (90.7 kg)   SpO2 96% Comment: ON RA  BMI 31.32 kg/m  General appearance: alert, cooperative and no distress Head: Normocephalic, without obvious abnormality, atraumatic Neck: no adenopathy, no carotid bruit, no JVD, supple, symmetrical, trachea midline and thyroid not enlarged, symmetric, no tenderness/mass/nodules Lymph nodes: Cervical, supraclavicular, and axillary nodes normal. Resp: clear to auscultation bilaterally Cardio: regular rate and rhythm, S1, S2 normal, no murmur, click, rub or gallop GI: soft, non-tender; bowel sounds normal; no masses,  no organomegaly Extremities: varicose veins noted Neurologic: Alert and oriented X 3, normal strength and tone. Normal symmetric reflexes. Normal coordination and gait  Diagnostic Studies & Laboratory data:     Recent Radiology Findings:   Nm Pet Image Initial (pi) Skull Base To Thigh  Result Date: 01/01/2018 CLINICAL DATA:  Subsequent treatment strategy for right lung nodule. EXAM: NUCLEAR MEDICINE PET SKULL BASE TO THIGH TECHNIQUE: 10.0 mCi F-18 FDG was injected intravenously. Full-ring PET imaging was performed from the skull base to thigh after the radiotracer. CT data was obtained and used for attenuation correction and anatomic localization. Fasting blood glucose: 121 mg/dl COMPARISON:  08/26/2017 FINDINGS: (Reference/background mediastinal blood  pool activity: SUV max = 2.5) NECK:  No hypermetabolic lymph nodes or masses. Incidental CT findings:  None. CHEST: No hypermetabolic masses or lymphadenopathy. A 1.5 cm ground-glass nodule is seen in the posterior right lung apex which remains stable in size since previous studies. No solid component visualized. This does show mild FDG uptake with SUV of 1.7, without significant change compared to prior study. No new or enlarging pulmonary nodules  identified. No evidence of pleural effusion. Incidental CT findings: Aortic and coronary artery atherosclerosis. Stable 4.1 cm ascending thoracic aortic aneurysm. ABDOMEN/PELVIS: No abnormal hypermetabolic activity within the liver, pancreas, adrenal glands, or spleen. No hypermetabolic lymph nodes in the abdomen or pelvis. A subcapsular lesion is seen in the lateral midpole of the left kidney which measures 2.4 cm. This shows FDG uptake with SUV max 3.4, suspicious for low-grade renal cell carcinoma. A fluid attenuation cystic lesion in the pancreatic tail measures 2.8 x 2.7 cm, and is stable compared to prior CT in 2013. This shows no FDG uptake, and is most consistent with a benign etiology such as an indolent cystic pancreatic neoplasm or pseudocyst. Incidental CT findings: Tiny calcified gallstones noted, without evidence of cholecystitis. Mildly enlarged prostate. SKELETON: No focal hypermetabolic bone lesions to suggest skeletal metastasis. Incidental CT findings:  None. IMPRESSION: 2.4 cm subcapsular lesion in left kidney with FDG uptake, highly suspicious for renal cell carcinoma. Recommend abdomen MRI without and with contrast for further evaluation. These results will be called to the ordering clinician or representative by the Radiologist Assistant, and communication documented in the PACS or zVision Dashboard. Stable 1.5 cm ground-glass nodule in posterior right lung apex with low-grade FDG uptake. Low-grade adenocarcinoma cannot be excluded. Recommend  continued follow-up by chest CT without contrast (rather than PET CT) in 12 months. This recommendation follows the consensus statement: Guidelines for Management of Small Pulmonary Nodules Detected on CT Images: From the Fleischner Society 2017; published online before print (10.1148/radiol.1610960454). Cystic lesion in pancreatic tail shows no FDG uptake and remains stable, consistent with an indolent cystic pancreatic neoplasm or pseudocyst. This can also be assessed by MRI. Stable 4.1 cm ascending thoracic aortic aneurysm. This can also be followed up by chest CT in 12 months. This recommendation follows 2010 ACCF/AHA/AATS/ACR/ASA/SCA/SCAI/SIR/STS/SVM Guidelines for the Diagnosis and Management of Patients with Thoracic Aortic Disease. Circulation. 2010; 121: [id number]. Electronically Signed   By: Earle Gell M.D.   On: 01/01/2018 12:01     I have independently reviewed the above radiology studies  and reviewed the findings with the patient.   Recent Lab Findings: Lab Results  Component Value Date   WBC 6.9 10/10/2017   HGB 16.7 10/10/2017   HCT 50.8 10/10/2017   PLT 122 (L) 10/10/2017   GLUCOSE 137 (H) 10/18/2017   CHOL 190 04/01/2012   TRIG (H) 04/01/2012    525.0 Triglyceride is over 400; calculations on Lipids are invalid.   HDL 27.50 (L) 04/01/2012   LDLDIRECT 84.5 04/01/2012   LDLCALC 32 04/03/2010   ALT 31 08/01/2016   AST 21 08/01/2016   NA 140 10/18/2017   K 4.4 10/18/2017   CL 105 10/18/2017   CREATININE 1.79 (H) 10/18/2017   BUN 56 (H) 10/18/2017   CO2 25 10/18/2017   TSH 2.28 04/01/2012   HGBA1C 6.6 (H) 10/10/2017   Chronic Kidney Disease   Stage I     GFR >90  Stage II    GFR 60-89  Stage IIIA GFR 45-59  Stage IIIB GFR 30-44  Stage IV   GFR 15-29  Stage V    GFR  <15  Lab Results  Component Value Date   CREATININE 1.79 (H) 10/18/2017   CrCl cannot be calculated (Patient's most recent lab result is older than the maximum 21 days allowed.).  Pulmonary  function studies done today Conclusions: The results are within normal limits. Pulmonary Function Diagnosis: Normal Pulmonary Function  FEV1 2.9  89% predicted  DLCO 28. 85% predicted  Cath: Left Anterior Descending  Collaterals  Mid LAD filled by collaterals from Post Atrio.    Ost LAD to Prox LAD lesion 100% stenosed  Ost LAD to Prox LAD lesion is 100% stenosed. The lesion is severely calcified.  Mid LAD to Dist LAD lesion 90% stenosed  Mid LAD to Dist LAD lesion is 90% stenosed.  Dist LAD lesion 90% stenosed  Dist LAD lesion is 90% stenosed.  Ramus Intermedius  Vessel is large.  Ost Ramus lesion 75% stenosed  Ost Ramus lesion is 75% stenosed.  Ramus-1 lesion 80% stenosed  Ramus-1 lesion is 80% stenosed.  Ramus-2 lesion 60% stenosed  Ramus-2 lesion is 60% stenosed.  Left Circumflex  Vessel is normal in caliber. Mild proximal disease  Right Coronary Artery  Prox RCA lesion 40% stenosed  Prox RCA lesion is 40% stenosed.  Mid RCA lesion 70% stenosed  Mid RCA lesion is 70% stenosed. The lesion is focal.  Right Posterior Descending Artery  Ost RPDA to RPDA lesion 100% stenosed  Ost RPDA to RPDA lesion is 100% stenosed.   AO Systolic Pressure 397 mmHg  AO Diastolic Pressure 69 mmHg  AO Mean 91 mmHg  LV Systolic Pressure 673 mmHg  LV Diastolic Pressure 2 mmHg  LV EDP 15 mmHg  AOp Systolic Pressure 419 mmHg  AOp Diastolic Pressure 75 mmHg  AOp Mean Pressure 379 mmHg  LVp Systolic Pressure 024 mmHg  LVp Diastolic Pressure 4 mmHg  LVp EDP Pressure 15 mmHg      Assessment / Plan:   1/Stable 1.5 cm ground-glass nodule in posterior right lung apex with low-grade FDG uptake. Low-grade adenocarcinoma cannot be excluded. 2/2.4 cm subcapsular lesion in left kidney with FDG uptake, highly suspicious for renal cell carcinoma.-Patient has seen urology and this lesion will continue to be followed 3/Stable 4.1 cm ascending thoracic aortic aneurysm. 4/history of papillary  carcinoma of the thyroid-with positive resection margin 5 high risk nuclear cardiology study done at [facility] cardiovascular dated 12/21/2010-patient is now had a cardiac catheterization which shows significant three-vessel disease as noted above including totally occluded LAD.  I discussed with the patient proceeding with coronary artery bypass grafting.  I have explained to him the process involved risks and options.  With history of decreased LV function and severe three-vessel coronary artery disease in a diabetic coronary artery bypass grafting would be his best option.  He is willing to proceed with surgery but wishes to discuss with his family and make arrangements for care postoperatively.  He prefers to wait until November before he proceeds with surgery.  I will plan to see him back October 31 with consideration for coronary artery bypass grafting in the first half of November.  6 stage III chronic kidney disease    Grace Isaac MD      [address] [place] [address] Office [phone]   Beeper [phone]  01/28/2018 3:25 PM

## 2018-02-06 ENCOUNTER — Ambulatory Visit: Payer: Self-pay | Admitting: Surgery

## 2018-02-10 ENCOUNTER — Encounter (HOSPITAL_COMMUNITY): Payer: Self-pay

## 2018-02-10 NOTE — Patient Instructions (Signed)
Nathan Medina  02/10/2018   Your procedure is scheduled on: 02-20-18  Report to [facility] Main  Entrance  Report to admitting at 830 AM    Call this number if you have problems the morning of surgery [phone]   Remember: Do not eat food or drink liquids :After Midnight. BRUSH YOUR TEETH MORNING OF SURGERY AND RINSE YOUR MOUTH OUT, NO CHEWING GUM CANDY OR MINTS.     Take these medicines the morning of surgery with A SIP OF WATER: carvedilol, bidil, allopurinol, levothryoxine (synthroid), omeprazole (prilosec) DO NOT TAKE ANY DIABETIC MEDICATIONS DAY OF YOUR SURGERY                  How to Manage Your Diabetes Before and After Surgery  Why is it important to control my blood sugar before and after surgery? . Improving blood sugar levels before and after surgery helps healing and can limit problems. . A way of improving blood sugar control is eating a healthy diet by: o  Eating less sugar and carbohydrates o  Increasing activity/exercise o  Talking with your doctor about reaching your blood sugar goals . High blood sugars (greater than 180 mg/dL) can raise your risk of infections and slow your recovery, so you will need to focus on controlling your diabetes during the weeks before surgery. . Make sure that the doctor who takes care of your diabetes knows about your planned surgery including the date and location.  How do I manage my blood sugar before surgery? . Check your blood sugar at least 4 times a day, starting 2 days before surgery, to make sure that the level is not too high or low. o Check your blood sugar the morning of your surgery when you wake up and every 2 hours until you get to the Short Stay unit. . If your blood sugar is less than 70 mg/dL, you will need to treat for low blood sugar: o Do not take insulin. o Treat a low blood sugar (less than 70 mg/dL) with  cup of clear juice (cranberry or apple), 4 glucose tablets, OR glucose  gel. o Recheck blood sugar in 15 minutes after treatment (to make sure it is greater than 70 mg/dL). If your blood sugar is not greater than 70 mg/dL on recheck, call [phone] for further instructions. . Report your blood sugar to the short stay nurse when you get to Short Stay.  . If you are admitted to the hospital after surgery: o Your blood sugar will be checked by the staff and you will probably be given insulin after surgery (instead of oral diabetes medicines) to make sure you have good blood sugar levels. o The goal for blood sugar control after surgery is 80-180 mg/dL.   WHAT DO I DO ABOUT MY DIABETES MEDICATION?  [name] Do not take oral diabetes medicines (pills) the morning of surgery.  . THE NIGHT BEFORE SURGERY TAKE 1/2 YOUR USUAL DOSE OF BEDTIME LANTUS INSULIN   . THE MORNING OF SURGERY TAKE NO LANTUS INSULIN      Patient Signature:  Date:   Nurse Signature:  Date:   Reviewed and Endorsed by [facility] Patient Education Committee, August 2015             You may not have any metal on your body including hair pins and  piercings  Do not wear jewelry, make-up, lotions, powders or perfumes, deodorant             Do not wear nail polish.  Do not shave  48 hours prior to surgery.              Men may shave face and neck.   Do not bring valuables to the hospital. [place].  Contacts, dentures or bridgework may not be worn into surgery.  Leave suitcase in the car. After surgery it may be brought to your room.     Patients discharged the day of surgery will not be allowed to drive home.  Name and phone number of your driver:  Special Instructions: N/A              Please read over the following fact sheets you were given: _____________________________________________________________________   [facility] - Preparing for Surgery Before surgery, you can play an important role.  Because skin is not sterile,  your skin needs to be as free of germs as possible.  You can reduce the number of germs on your skin by washing with CHG (chlorahexidine gluconate) soap before surgery.  CHG is an antiseptic cleaner which kills germs and bonds with the skin to continue killing germs even after washing. Please DO NOT use if you have an allergy to CHG or antibacterial soaps.  If your skin becomes reddened/irritated stop using the CHG and inform your nurse when you arrive at Short Stay. Do not shave (including legs and underarms) for at least 48 hours prior to the first CHG shower.  You may shave your face/neck. Please follow these instructions carefully:  1.  Shower with CHG Soap the night before surgery and the  morning of Surgery.  2.  If you choose to wash your hair, wash your hair first as usual with your  normal  shampoo.  3.  After you shampoo, rinse your hair and body thoroughly to remove the  shampoo.                           4.  Use CHG as you would any other liquid soap.  You can apply chg directly  to the skin and wash                       Gently with a scrungie or clean washcloth.  5.  Apply the CHG Soap to your body ONLY FROM THE NECK DOWN.   Do not use on face/ open                           Wound or open sores. Avoid contact with eyes, ears mouth and genitals (private parts).                       Wash face,  Genitals (private parts) with your normal soap.             6.  Wash thoroughly, paying special attention to the area where your surgery  will be performed.  7.  Thoroughly rinse your body with warm water from the neck down.  8.  DO NOT shower/wash with your normal soap after using and rinsing off  the CHG Soap.  9.  Pat yourself dry with a clean towel.            10.  Wear clean pajamas.            11.  Place clean sheets on your bed the night of your first shower and do not  sleep with pets. Day of Surgery : Do not apply any lotions/deodorants the morning of surgery.  Please wear  clean clothes to the hospital/surgery center.

## 2018-02-13 NOTE — Progress Notes (Signed)
NUCLEAR STRESS 06-28-17 [facility] ON CHART EKG 7-119 DR Einar Gip ON CHART TRANSTHORACIC ECHO 12-26-17 DR Einar Gip ON CHART LOV DR  [facility] 01-24-18 ON CHART

## 2018-02-14 ENCOUNTER — Other Ambulatory Visit: Payer: Self-pay

## 2018-02-14 ENCOUNTER — Encounter (HOSPITAL_COMMUNITY)
Admission: RE | Admit: 2018-02-14 | Discharge: 2018-02-14 | Disposition: A | Discharge status: Home / Self Care | Payer: Medicare Other | Source: Ambulatory Visit | Attending: Surgery | Admitting: Surgery

## 2018-02-14 ENCOUNTER — Encounter (HOSPITAL_COMMUNITY): Payer: Self-pay

## 2018-02-14 DIAGNOSIS — E119 Type 2 diabetes mellitus without complications: Secondary | ICD-10-CM | POA: Insufficient documentation

## 2018-02-14 DIAGNOSIS — E079 Disorder of thyroid, unspecified: Secondary | ICD-10-CM | POA: Insufficient documentation

## 2018-02-14 DIAGNOSIS — K219 Gastro-esophageal reflux disease without esophagitis: Secondary | ICD-10-CM | POA: Diagnosis not present

## 2018-02-14 DIAGNOSIS — I10 Essential (primary) hypertension: Secondary | ICD-10-CM | POA: Diagnosis not present

## 2018-02-14 DIAGNOSIS — E785 Hyperlipidemia, unspecified: Secondary | ICD-10-CM | POA: Diagnosis not present

## 2018-02-14 DIAGNOSIS — Z01812 Encounter for preprocedural laboratory examination: Secondary | ICD-10-CM | POA: Diagnosis present

## 2018-02-14 DIAGNOSIS — M109 Gout, unspecified: Secondary | ICD-10-CM | POA: Insufficient documentation

## 2018-02-14 DIAGNOSIS — F419 Anxiety disorder, unspecified: Secondary | ICD-10-CM | POA: Diagnosis not present

## 2018-02-14 HISTORY — DX: Epidermal cyst: L72.0

## 2018-02-14 HISTORY — DX: Unspecified macular degeneration: H35.30

## 2018-02-14 HISTORY — DX: Unspecified hearing loss, unspecified ear: H91.90

## 2018-02-14 HISTORY — DX: Atherosclerotic heart disease of native coronary artery without angina pectoris: I25.10

## 2018-02-14 LAB — HEMOGLOBIN A1C
Hgb A1c MFr Bld: 6.2 % — ABNORMAL HIGH (ref 4.8–5.6)
Mean Plasma Glucose: 131.24 mg/dL

## 2018-02-14 LAB — CBC
HCT: 47.4 % (ref 39.0–52.0)
Hemoglobin: 15.8 g/dL (ref 13.0–17.0)
MCH: 31.6 pg (ref 26.0–34.0)
MCHC: 33.3 g/dL (ref 30.0–36.0)
MCV: 94.8 fL (ref 78.0–100.0)
Platelets: 130 10*3/uL — ABNORMAL LOW (ref 150–400)
RBC: 5 MIL/uL (ref 4.22–5.81)
RDW: 14.5 % (ref 11.5–15.5)
WBC: 5.8 10*3/uL (ref 4.0–10.5)

## 2018-02-14 LAB — BASIC METABOLIC PANEL
Anion gap: 11 (ref 5–15)
BUN: 34 mg/dL — ABNORMAL HIGH (ref 8–23)
CO2: 30 mmol/L (ref 22–32)
Calcium: 9.8 mg/dL (ref 8.9–10.3)
Chloride: 104 mmol/L (ref 98–111)
Creatinine, Ser: 1.56 mg/dL — ABNORMAL HIGH (ref 0.61–1.24)
GFR calc Af Amer: 51 mL/min — ABNORMAL LOW (ref >60)
GFR calc non Af Amer: 44 mL/min — ABNORMAL LOW (ref >60)
Glucose, Bld: 88 mg/dL (ref 70–99)
Potassium: 3.8 mmol/L (ref 3.5–5.1)
Sodium: 145 mmol/L (ref 135–145)

## 2018-02-14 LAB — GLUCOSE, CAPILLARY: Glucose-Capillary: 115 mg/dL — ABNORMAL HIGH (ref 70–99)

## 2018-02-14 NOTE — Progress Notes (Signed)
bmet results routed to dr todd gerkin epic inbasket

## 2018-02-14 NOTE — Progress Notes (Signed)
Spoke with dr Ronalee Belts [facility] anesthesia and made aware ekg 01-14-18 result and ekg result 10-10-17, and patient medical history, do not need to repeat ekg per dr Ronalee Belts [facility] anesthesia and patient ok for surgery per dr foster anesthesia.

## 2018-02-19 ENCOUNTER — Encounter (HOSPITAL_COMMUNITY): Payer: Self-pay | Admitting: Surgery

## 2018-02-19 NOTE — H&P (Signed)
General Surgery [facility]  Cranston Neighbor Documented: 02/06/2018 3:20 PM Location: [facility] Patient #: [phone] DOB: 1951/04/30 Single / Language: Cleophus Molt / Race: White Male   History of Present Illness Earnstine Regal MD; 02/06/2018 4:10 PM) The patient is a 67 year old male who presents with an epidermal cyst.  CHIEF COMPLAINT: epidermal cyst on right back  Patient is well known to my practice. He returns today with a enlarging epidermal cyst on the right mid back. This has been treated in the past by his dermatologist. It has recurred. It causes him discomfort. He is concerned about possible infection in the near future as he is scheduled to undergo coronary artery bypass surgery later this year. He would like to have the cyst removed prior to his cardiac surgery.   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 Owens Shark, [place]; 02/06/2018 3:21 PM) Sulfa Antibiotics  Allergies Reconciled   Medication History (Tanisha A. Owens Shark, RMA; 02/06/2018 3:21 PM) Levothyroxine Sodium (137MCG Tablet, Oral) Active. AmLODIPine Besylate (5MG  Tablet, Oral) Active. Labetalol HCl (200MG  Tablet, Oral) Active. Cyclobenzaprine HCl (5MG  Tablet, Oral) Active. Excedrin Extra Strength (250-250-65MG  Tablet, Oral) Active. Furosemide (40MG  Tablet, Oral) Active. GuaiFENesin ER (1200MG  Tablet ER 12HR, Oral) Active. Ibuprofen  (300MG  Tablet, Oral) Active. Lovaza (1GM Capsule, Oral) Active. PriLOSEC (10MG  Capsule DR, Oral) Active. Sildenafil Citrate (20MG  Tablet, Oral) Active. Tylenol PM Extra Strength (500-25MG  Tablet, Oral) Active. Allopurinol (300MG  Tablet, Oral) Active. ALPRAZolam (0.5MG  Tablet, Oral) Active. Fluconazole (150MG  Tablet, Oral) Active. Jardiance (25MG  Tablet, Oral) Active. Lantus SoloStar (100UNIT/ML Soln Pen-inj, Subcutaneous) Active. MetFORMIN HCl (1000MG  Tablet, Oral) Active. Nystatin (100000 UNIT/GM Ointment, External) Active. Omega-3-acid Ethyl Esters (1GM Capsule, Oral) Active. Aspirin (81MG  Tablet, Oral) Active. Benadryl Allergy (25MG  Tablet, Oral) Active. Claritin (10MG  Tablet, Oral) Active. Medications Reconciled  Social History Earnstine Regal, MD; 02/06/2018 4:12 PM) Alcohol use  Occasional alcohol use. Caffeine use  Carbonated beverages, Coffee, Tea. No drug use  Tobacco use  Never smoker.  Family History Earnstine Regal, MD; 02/06/2018 4:12 PM) First Degree Relatives  No pertinent family history   Other Problems Earnstine Regal, MD; 02/06/2018 4:12 PM) Anxiety Disorder  Diabetes Mellitus  Diverticulosis  Gastroesophageal Reflux Disease  Hemorrhoids  Thyroidectomy; Total  High blood pressure  Inguinal Hernia  Thyroid Disease   Vitals (Tanisha A. Brown RMA; 02/06/2018 3:21 PM) 02/06/2018 3:21 PM Weight: 199.2 lb Height: 67in Body Surface Area: 2.02 m Body Mass Index: 31.2 kg/m  Temp.: 97.61F  Pulse: 68 (Regular)  BP: 126/74 (Sitting, Left Arm, Standard)       Physical Exam Earnstine Regal MD; 02/06/2018 4:10 PM) The physical exam findings are as follows: Note:See vital signs recorded above  GENERAL APPEARANCE Development: normal Nutritional status: normal Gross deformities: none  SKIN Rash, lesions, ulcers: none Induration, erythema: none Nodules: none palpable  EYES Conjunctiva and lids: normal Pupils:  equal and reactive Iris: normal bilaterally  EARS, NOSE, MOUTH, THROAT External ears: no lesion or deformity External  nose: no lesion or deformity Hearing: grossly normal Lips: no lesion or deformity Dentition: normal for age Oral mucosa: moist  NECK Symmetric: yes Trachea: midline Thyroid: no palpable nodules in the thyroid bed  CHEST Respiratory effort: normal Retraction or accessory muscle use: no Breath sounds: normal bilaterally Rales, rhonchi, wheeze: none  CARDIOVASCULAR Auscultation: regular rhythm, normal rate Murmurs: none Pulses: carotid and radial pulse 2+ palpable Lower extremity edema: none Lower extremity varicosities: none  BACK In the mid right lower back is a 4 x 3 x 2 cm soft tissue mass consistent with an epidermal cyst. This does not appear acutely inflamed. It is not fluctuant. It is minimally tender. There is a well-healed scar overlying the mass.  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 This is been present for some time. He has attempted to have this managed in the past by his dermatologist but it has recurred. Patient is concerned about possible infection during the time of his upcoming coronary artery bypass surgery.  I think we can excise this under local anesthesia with sedation. Given his cardiac situation, we will plan to do that as an outpatient in the main hospital operating room. We  discussed closure of the wound with either absorbable sutures or nylon sutures which would need to be removed in the office. We will make arrangements for excision at a time convenient for the patient in the near future.  The risks and benefits of the procedure have been discussed at length with the patient. The patient understands the proposed procedure, potential alternative treatments, and the course of recovery to be expected. All of the patient's questions have been answered at this time. The patient wishes to proceed with surgery.  [name], [facility] Office: [phone]

## 2018-02-20 ENCOUNTER — Ambulatory Visit (HOSPITAL_COMMUNITY): Payer: Medicare Other | Admitting: Certified Registered"

## 2018-02-20 ENCOUNTER — Encounter (HOSPITAL_COMMUNITY): Payer: Self-pay

## 2018-02-20 ENCOUNTER — Ambulatory Visit (HOSPITAL_COMMUNITY)
Admission: RE | Admit: 2018-02-20 | Discharge: 2018-02-20 | Disposition: A | Discharge status: Home / Self Care | Payer: Medicare Other | Source: Ambulatory Visit | Attending: Surgery | Admitting: Surgery

## 2018-02-20 ENCOUNTER — Encounter (HOSPITAL_COMMUNITY)
Admission: RE | Disposition: A | Discharge status: Home / Self Care | Payer: Self-pay | Source: Ambulatory Visit | Attending: Surgery

## 2018-02-20 ENCOUNTER — Other Ambulatory Visit: Payer: Self-pay

## 2018-02-20 DIAGNOSIS — Z79890 Hormone replacement therapy (postmenopausal): Secondary | ICD-10-CM | POA: Insufficient documentation

## 2018-02-20 DIAGNOSIS — F419 Anxiety disorder, unspecified: Secondary | ICD-10-CM | POA: Diagnosis not present

## 2018-02-20 DIAGNOSIS — Z794 Long term (current) use of insulin: Secondary | ICD-10-CM | POA: Insufficient documentation

## 2018-02-20 DIAGNOSIS — I2510 Atherosclerotic heart disease of native coronary artery without angina pectoris: Secondary | ICD-10-CM | POA: Insufficient documentation

## 2018-02-20 DIAGNOSIS — I10 Essential (primary) hypertension: Secondary | ICD-10-CM | POA: Insufficient documentation

## 2018-02-20 DIAGNOSIS — K219 Gastro-esophageal reflux disease without esophagitis: Secondary | ICD-10-CM | POA: Insufficient documentation

## 2018-02-20 DIAGNOSIS — Z79899 Other long term (current) drug therapy: Secondary | ICD-10-CM | POA: Diagnosis not present

## 2018-02-20 DIAGNOSIS — Z7982 Long term (current) use of aspirin: Secondary | ICD-10-CM | POA: Diagnosis not present

## 2018-02-20 DIAGNOSIS — E119 Type 2 diabetes mellitus without complications: Secondary | ICD-10-CM | POA: Insufficient documentation

## 2018-02-20 DIAGNOSIS — E890 Postprocedural hypothyroidism: Secondary | ICD-10-CM | POA: Insufficient documentation

## 2018-02-20 DIAGNOSIS — L720 Epidermal cyst: Secondary | ICD-10-CM | POA: Diagnosis present

## 2018-02-20 HISTORY — PX: CYST REMOVAL TRUNK: SHX6283

## 2018-02-20 LAB — GLUCOSE, CAPILLARY
Glucose-Capillary: 108 mg/dL — ABNORMAL HIGH (ref 70–99)
Glucose-Capillary: 128 mg/dL — ABNORMAL HIGH (ref 70–99)

## 2018-02-20 SURGERY — CYST REMOVAL TRUNK
Anesthesia: Monitor Anesthesia Care | Site: Back | Laterality: Right

## 2018-02-20 MED ORDER — PROPOFOL 10 MG/ML IV BOLUS
INTRAVENOUS | Status: AC
  Filled 2018-02-20: qty 20

## 2018-02-20 MED ORDER — VANCOMYCIN HCL IN DEXTROSE 1-5 GM/200ML-% IV SOLN
INTRAVENOUS | Status: AC
  Filled 2018-02-20: qty 200

## 2018-02-20 MED ORDER — ONDANSETRON HCL 4 MG/2ML IJ SOLN
INTRAMUSCULAR | Status: DC | PRN
  Administered 2018-02-20: 4 mg via INTRAVENOUS

## 2018-02-20 MED ORDER — BUPIVACAINE HCL (PF) 0.25 % IJ SOLN
INTRAMUSCULAR | Status: DC | PRN
  Administered 2018-02-20: 19 mL

## 2018-02-20 MED ORDER — PROPOFOL 10 MG/ML IV BOLUS
INTRAVENOUS | Status: DC | PRN
  Administered 2018-02-20 (×2): 10 mg via INTRAVENOUS
  Administered 2018-02-20: 20 mg via INTRAVENOUS

## 2018-02-20 MED ORDER — MIDAZOLAM HCL 2 MG/2ML IJ SOLN
INTRAMUSCULAR | Status: AC
  Filled 2018-02-20: qty 2

## 2018-02-20 MED ORDER — PROPOFOL 500 MG/50ML IV EMUL
INTRAVENOUS | Status: DC | PRN
  Administered 2018-02-20: 50 ug/kg/min via INTRAVENOUS

## 2018-02-20 MED ORDER — CHLORHEXIDINE GLUCONATE CLOTH 2 % EX PADS: 6.0000 | MEDICATED_PAD | Freq: Once | CUTANEOUS | Status: DC

## 2018-02-20 MED ORDER — FENTANYL CITRATE (PF) 100 MCG/2ML IJ SOLN: 25.0000 ug | INTRAMUSCULAR | Status: DC | PRN

## 2018-02-20 MED ORDER — VANCOMYCIN HCL IN DEXTROSE 1-5 GM/200ML-% IV SOLN
1000.0000 mg | INTRAVENOUS | Status: AC
  Administered 2018-02-20: 1000 mg via INTRAVENOUS

## 2018-02-20 MED ORDER — 0.9 % SODIUM CHLORIDE (POUR BTL) OPTIME
TOPICAL | Status: DC | PRN
  Administered 2018-02-20: 1000 mL

## 2018-02-20 MED ORDER — PROMETHAZINE HCL 25 MG/ML IJ SOLN: 6.2500 mg | INTRAMUSCULAR | Status: DC | PRN

## 2018-02-20 MED ORDER — BACITRACIN ZINC 500 UNIT/GM EX OINT
TOPICAL_OINTMENT | CUTANEOUS | Status: AC
  Filled 2018-02-20: qty 28.35

## 2018-02-20 MED ORDER — BUPIVACAINE HCL (PF) 0.25 % IJ SOLN
INTRAMUSCULAR | Status: AC
  Filled 2018-02-20: qty 30

## 2018-02-20 MED ORDER — LACTATED RINGERS IV SOLN
INTRAVENOUS | Status: DC
  Administered 2018-02-20: 09:00:00 via INTRAVENOUS

## 2018-02-20 SURGICAL SUPPLY — 33 items
BLADE HEX COATED 2.75 (ELECTRODE) IMPLANT
BLADE SURG SZ10 CARB STEEL (BLADE) ×2 IMPLANT
CHLORAPREP W/TINT 26ML (MISCELLANEOUS) ×2 IMPLANT
COVER SURGICAL LIGHT HANDLE (MISCELLANEOUS) ×2 IMPLANT
DECANTER SPIKE VIAL GLASS SM (MISCELLANEOUS) IMPLANT
DERMABOND ADVANCED (GAUZE/BANDAGES/DRESSINGS) ×1
DERMABOND ADVANCED .7 DNX12 (GAUZE/BANDAGES/DRESSINGS) ×1 IMPLANT
DRAIN CHANNEL RND F F (WOUND CARE) IMPLANT
DRAPE LAPAROTOMY T 102X78X121 (DRAPES) ×2 IMPLANT
DRAPE LAPAROTOMY TRNSV 102X78 (DRAPE) IMPLANT
DRAPE SHEET LG 3/4 BI-LAMINATE (DRAPES) IMPLANT
ELECT REM PT RETURN 15FT ADLT (MISCELLANEOUS) ×2 IMPLANT
EVACUATOR SILICONE 100CC (DRAIN) IMPLANT
GAUZE SPONGE 4X4 12PLY STRL (GAUZE/BANDAGES/DRESSINGS) ×2 IMPLANT
GLOVE BIOGEL PI IND STRL 7.0 (GLOVE) ×1 IMPLANT
GLOVE BIOGEL PI INDICATOR 7.0 (GLOVE) ×1
GLOVE SURG ORTHO 8.0 STRL STRW (GLOVE) ×2 IMPLANT
GOWN STRL REUS W/TWL LRG LVL3 (GOWN DISPOSABLE) ×2 IMPLANT
GOWN STRL REUS W/TWL XL LVL3 (GOWN DISPOSABLE) ×4 IMPLANT
KIT BASIN OR (CUSTOM PROCEDURE TRAY) ×2 IMPLANT
MARKER SKIN DUAL TIP RULER LAB (MISCELLANEOUS) IMPLANT
NEEDLE HYPO 25X1 1.5 SAFETY (NEEDLE) ×2 IMPLANT
NS IRRIG 1000ML POUR BTL (IV SOLUTION) ×2 IMPLANT
PACK GENERAL/GYN (CUSTOM PROCEDURE TRAY) ×2 IMPLANT
SPONGE LAP 18X18 RF (DISPOSABLE) IMPLANT
STAPLER VISISTAT 35W (STAPLE) IMPLANT
STRIP CLOSURE SKIN 1/2X4 (GAUZE/BANDAGES/DRESSINGS) IMPLANT
SUT ETHILON 3 0 PS 1 (SUTURE) ×4 IMPLANT
SUT MNCRL AB 4-0 PS2 18 (SUTURE) ×2 IMPLANT
SUT VIC AB 3-0 SH 18 (SUTURE) ×2 IMPLANT
SYR CONTROL 10ML LL (SYRINGE) ×2 IMPLANT
TAPE CLOTH SURG 4X10 WHT LF (GAUZE/BANDAGES/DRESSINGS) ×2 IMPLANT
TOWEL OR 17X26 10 PK STRL BLUE (TOWEL DISPOSABLE) ×2 IMPLANT

## 2018-02-20 NOTE — Transfer of Care (Signed)
Immediate Anesthesia Transfer of Care Note  Patient: Nathan Medina  Procedure(s) Performed: epidermal cyst excision right lower back (Right Back)  Patient Location: PACU  Anesthesia Type:MAC  Level of Consciousness: drowsy, patient cooperative and responds to stimulation  Airway & Oxygen Therapy: Patient Spontanous Breathing and Patient connected to face mask oxygen  Post-op Assessment: Report given to RN and Post -op Vital signs reviewed and stable  Post vital signs: Reviewed and stable  Last Vitals:  Vitals Value Taken Time  BP    Temp    Pulse    Resp    SpO2      Last Pain:  Vitals:   02/20/18 0916  TempSrc:   PainSc: 0-No pain         Complications: No apparent anesthesia complications

## 2018-02-20 NOTE — Anesthesia Procedure Notes (Signed)
Date/Time: 02/20/2018 12:41 PM Performed by: Glory Buff, CRNA Oxygen Delivery Method: Simple face mask

## 2018-02-20 NOTE — Brief Op Note (Signed)
02/20/2018  1:26 PM  PATIENT:  Nathan Medina  67 y.o. male  PRE-OPERATIVE DIAGNOSIS:  RECURRENT EPIDERMAL CYST  POST-OPERATIVE DIAGNOSIS:  RECURRENT EPIDERMAL CYST  PROCEDURE:  Procedure(s): epidermal cyst excision right lower back (Right)  SURGEON:  Surgeon(s) and Role:    [name], MD - Primary  PHYSICIAN ASSISTANT:   ASSISTANTS: none   ANESTHESIA:   IV sedation  EBL:  Minimal    BLOOD ADMINISTERED:none  DRAINS: none   LOCAL MEDICATIONS USED:  MARCAINE     SPECIMEN:  Excision  DISPOSITION OF SPECIMEN:  PATHOLOGY  COUNTS:  YES  TOURNIQUET:  * No tourniquets in log *  DICTATION: .Other Dictation: Dictation Number [id number]  PLAN OF CARE: Discharge to home after PACU  PATIENT DISPOSITION:  PACU - hemodynamically stable.   Delay start of Pharmacological VTE agent (>24hrs) due to surgical blood loss or risk of bleeding: yes  [name], MD [facility] Surgery Office: [phone]

## 2018-02-20 NOTE — Interval H&P Note (Signed)
History and Physical Interval Note:  02/20/2018 12:19 PM  Nathan Medina  has presented today for surgery, with the diagnosis of RECURRENT EPIDERMAL CYST  The various methods of treatment have been discussed with the patient and family. After consideration of risks, benefits and other options for treatment, the patient has consented to  Procedure(s): epidermal cyst excision right lower back (Right) as a surgical intervention .  The patient's history has been reviewed, patient examined, no change in status, stable for surgery.  I have reviewed the patient's chart and labs.  Questions were answered to the patient's satisfaction.     Nathan Medina

## 2018-02-20 NOTE — Anesthesia Postprocedure Evaluation (Signed)
Anesthesia Post Note  Patient: Nathan Medina  Procedure(s) Performed: epidermal cyst excision right lower back (Right Back)     Patient location during evaluation: PACU Anesthesia Type: MAC Level of consciousness: awake and alert Pain management: pain level controlled Vital Signs Assessment: post-procedure vital signs reviewed and stable Respiratory status: spontaneous breathing, nonlabored ventilation, respiratory function stable and patient connected to nasal cannula oxygen Cardiovascular status: stable and blood pressure returned to baseline Postop Assessment: no apparent nausea or vomiting Anesthetic complications: no    Last Vitals:  Vitals:   02/20/18 0847 02/20/18 1335  BP: 137/81 119/82  Pulse: (!) 57 (!) 53  Resp: 18 16  Temp: 36.5 C 36.4 C  SpO2: 97% 100%    Last Pain:  Vitals:   02/20/18 0916  TempSrc:   PainSc: 0-No pain                 Jasten Guyette S

## 2018-02-20 NOTE — Anesthesia Preprocedure Evaluation (Addendum)
Anesthesia Evaluation  Patient identified by MRN, date of birth, ID band Patient awake    Reviewed: Allergy & Precautions, NPO status , Patient's Chart, lab work & pertinent test results  Airway Mallampati: II  TM Distance: >3 FB Neck ROM: Full    Dental no notable dental hx.    Pulmonary neg pulmonary ROS,    Pulmonary exam normal breath sounds clear to auscultation       Cardiovascular hypertension, + CAD and + Peripheral Vascular Disease  Normal cardiovascular exam Rhythm:Regular Rate:Normal  EF 30% LM: Normal LAD: Ostial 100% occluded. 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 RPDA distally occluded.           Good surgical revascularization targets seen.  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 Recommend CVTS evaluation for CABG   Neuro/Psych negative neurological ROS  negative psych ROS   GI/Hepatic negative GI ROS, Neg liver ROS,   Endo/Other  diabetes  Renal/GU Renal InsufficiencyRenal disease  negative genitourinary   Musculoskeletal negative musculoskeletal ROS (+)   Abdominal   Peds negative pediatric ROS (+)  Hematology negative hematology ROS (+)   Anesthesia Other Findings   Reproductive/Obstetrics negative OB ROS                            Anesthesia Physical Anesthesia Plan  ASA: III  Anesthesia Plan: MAC   Post-op Pain Management:    Induction: Intravenous  PONV Risk Score and Plan: 0  Airway Management Planned: Simple Face Mask  Additional Equipment:   Intra-op Plan:   Post-operative Plan:   Informed Consent: I have reviewed the patients History and Physical,  chart, labs and discussed the procedure including the risks, benefits and alternatives for the proposed anesthesia with the patient or authorized representative who has indicated his/her understanding and acceptance.   Dental advisory given  Plan Discussed with: CRNA and Surgeon  Anesthesia Plan Comments: (No recent chest pain or acute SOB)       Anesthesia Quick Evaluation

## 2018-02-20 NOTE — Op Note (Signed)
NAME: [name], [name] MEDICAL RECORD SU:[id number] ACCOUNT [id number] DATE OF BIRTH:1951-01-12 FACILITY: [facility] LOCATION: [facility] PHYSICIAN:[place] Leeanne Mannan, MD  OPERATIVE REPORT  DATE OF PROCEDURE:  02/20/2018  PREOPERATIVE DIAGNOSIS:  Epidermal cyst, right lower back.  POSTOPERATIVE DIAGNOSIS:  Epidermal cyst, right lower back.  PROCEDURE:  Excision epidermal cyst right lower back, 4.5 x 3.0 x 2.0 cm.  SURGEON:  Earnstine Regal, MD  ANESTHESIA:  Local with intravenous sedation per Dr. Myrtie Soman.  ESTIMATED BLOOD LOSS:  Minimal.  PREPARATION:  ChloraPrep.  COMPLICATIONS:  None.  INDICATIONS:  The patient is a 67 year old white male with an enlarging epidermal inclusion cyst on the right lower back.  He has had a previous attempt at excision which was unsuccessful.  The cyst recurred and has continued to enlarge causing  discomfort.  The patient now comes to surgery for excision.  DESCRIPTION OF PROCEDURE:  The patient was brought to OR #1 at the [facility].  He is placed in a prone position on the operating room table.  Following administration of intravenous sedation, the patient was prepped and draped in the  usual aseptic fashion.  After ascertaining that an adequate level of sedation had been achieved, the skin overlying and surrounding the epidermal cyst in the right mid back is anesthetized with local anesthetic.  Local anesthetic was infiltrated into  the deep subcutaneous tissues.  Using a #10 blade, an elliptical incision was made so as to encompass the previous scar tissue from the previous excision as well as the sinus tracts connecting the cyst to the skin.  Dissection was carried in subcutaneous  tissues and the entire cyst was excised.  It appears to be multilobulated.  It contains a sebum.  There is no evidence of infection.  The entire cyst is excised using the electrocautery for hemostasis.  The cyst is submitted to pathology for  review.  Hemostasis was achieved throughout the wound with the electrocautery.  Wound was irrigated with saline, which was evacuated.  Subcutaneous tissues were closed with interrupted 3-0 Vicryl sutures.  Skin was closed with interrupted 3-0 nylon vertical  mattress sutures.  Wound was washed and dried and antibiotic ointment followed by a 4 x 4 and secured with Medipore tape was placed as dressing.  The patient is transported to the recovery room in stable condition.  The patient tolerated the procedure  well.  [name], [facility] Office: [phone]   TN/NUANCE  D:02/20/2018 T:02/20/2018 JOB:[id number]

## 2018-02-21 ENCOUNTER — Encounter (HOSPITAL_COMMUNITY): Payer: Self-pay | Admitting: Surgery

## 2018-02-21 NOTE — Progress Notes (Signed)
Please contact patient and notify of benign pathology results.  Surya Schroeter M. Ruffus Kamaka, MD, FACS [facility] Office: [phone]   

## 2018-03-20 ENCOUNTER — Other Ambulatory Visit: Payer: Self-pay | Admitting: *Deleted

## 2018-03-20 ENCOUNTER — Ambulatory Visit: Payer: Medicare Other | Admitting: Cardiothoracic Surgery

## 2018-03-20 VITALS — BP 112/73 | HR 62 | Resp 20 | Ht 67.0 in | Wt 194.0 lb

## 2018-03-20 DIAGNOSIS — R911 Solitary pulmonary nodule: Secondary | ICD-10-CM | POA: Diagnosis not present

## 2018-03-20 DIAGNOSIS — Z01818 Encounter for other preprocedural examination: Secondary | ICD-10-CM

## 2018-03-20 DIAGNOSIS — I712 Thoracic aortic aneurysm, without rupture, unspecified: Secondary | ICD-10-CM

## 2018-03-20 DIAGNOSIS — I2510 Atherosclerotic heart disease of native coronary artery without angina pectoris: Secondary | ICD-10-CM

## 2018-03-20 NOTE — Patient Instructions (Signed)
Stop Lovaza and vit E now for surgery nov 11   Coronary Artery Bypass Grafting Coronary artery bypass grafting (CABG) is a procedure to bypass or fix arteries of the heart (coronary arteries) that have become narrow or blocked. This narrowing is usually the result of a buildup of fatty deposits (plaques) in the walls of the vessels. The coronary arteries supply the heart with the oxygen and nutrients that it needs to pump blood to your body. In this procedure, a section of blood vessel from another part of the body (usually the chest, arm, or leg) is removed (harvested) and then inserted where it will allow blood to bypass the damaged part of the coronary artery. The harvested section of blood vessel is called the graft. Tell a health care provider about:  Any allergies you have.  All medicines you are taking or using, including steroids, blood thinners, vitamins, herbs, eye drops, creams, and over-the-counter medicines.  Any problems you or family members have had with anesthetic medicines.  Any blood disorders you have.  Any surgeries you have had.  Any medical conditions you have.  Whether you are pregnant or may be pregnant. What are the risks? Generally, this is a safe procedure. However, problems may occur, including:  Bleeding, which may require transfusions.  Infection.  Short term memory loss, confusion, and personality changes (cognitive dysfunction).  Pain at the surgical site.  Damage to other structures or organs.  Stroke.  Allergic reactions to medicines or dyes.  Heart attack during or after surgery.  Kidney failure.  There may be additional risks and complications depending on where the veins are harvested from in your body for the procedure. What happens before the procedure? Staying hydrated Follow instructions from your health care provider about hydration, which may include:  Up to 2 hours before the procedure - you may continue to drink clear  liquids, such as water, clear fruit juice, black coffee, and plain tea.  Eating and drinking restrictions Follow instructions from your health care provider about eating and drinking, which may include:  8 hours before the procedure - stop eating heavy meals or foods such as meat, fried foods, or fatty foods.  6 hours before the procedure - stop eating light meals or foods, such as toast or cereal.  6 hours before the procedure - stop drinking milk or drinks that contain milk.  2 hours before the procedure - stop drinking clear liquids.  Medicine  Take over-the-counter and prescription medicines only as told by your health care provider.  Ask your health care provider about: ? Changing or stopping your regular medicines. This is especially important if you are taking diabetes medicines or blood thinners. You may be asked to start new medicines and stop taking others. Do not stop medicines or adjust dosages on your own. ? Taking medicines such as aspirin and ibuprofen. These medicines can thin your blood. Do not take these medicines before your procedure if your health care provider instructs you not to.  You may be given antibiotic medicine to help prevent infection. General instructions  Ask your health care provider how your surgical site will be marked or identified.  You may be asked to shower with a germ-killing soap.  For 3-6 weeks before the procedure, try not to use any products that contain nicotine or tobacco, such as cigarettes and e-cigarettes. Quitting smoking is one of the best things you can do for your heart health. If you need help quitting, ask your health care  provider.  Talk with your health care provider about where graft will come from. What happens during the procedure?  To reduce your risk of infection: ? Your health care team will wash or sanitize their hands. ? Your skin will be washed with soap. ? Hair may be removed from the surgical area.  An IV tube  will be inserted into one of your veins.  You will be given one or more of the following: ? A medicine to help you relax (sedative). ? A medicine to make you fall asleep (general anesthetic).  A cut (incision) will be made down the front of the chest through the breastbone (sternum). The sternum will be spread open so your heart can be seen.  You may be placed on a heart-lung bypass machine. This machine will provide oxygen to your blood while the heart is undergoing surgery. Your surgeon may be able to do the surgery without the heart-lung bypass machine. That is called beating heart bypass surgery.  If a heart-lung bypass machine is needed, your heart will be temporarily stopped.  A section of blood vessel will be harvested from another part of your body (usually the chest, arm, or leg) and used to bypass the blocked arteries of your heart.  When the bypass is done, you will be taken off the heart-lung machine if it was used.  If your heart was stopped, it will be restarted and will take over again normally.  Your chest will be closed.  A bandage (dressing) will be placed over the incisions.  Tubes will remain in your chest and will be connected to a suction device to help drain fluid and reinflate the lungs. The procedure may vary among health care providers and hospitals. What happens after the procedure?  Your blood pressure, heart rate, breathing rate, and blood oxygen level will be monitored until the medicines you were given have worn off.  You may wake up with a tube in your throat to help your breathing. You may be connected to a breathing machine. You will not be able to talk while the tube is in place. The tube will be taken out as soon as it is safe.  You will be groggy and may have some pain. You will be given pain medicine to help control the pain.  You will be shown how to do deep breathing exercises. Summary  In this procedure, a section of blood vessel from  another part of the body (usually the chest, arm, or leg) is removed (harvested) and then inserted where it will allow blood to bypass the damaged part of the coronary artery. The harvested section of blood vessel is called the graft.  For 3-6 weeks before the procedure, try not to use any products that contain nicotine or tobacco, such as cigarettes and e-cigarettes. Quitting smoking is one of the best things you can do for your heart health. If you need help quitting, ask your health care provider.  You may be placed on a heart-lung bypass machine during the surgery. This machine will provide oxygen to your blood while the heart is undergoing surgery. Your surgeon may be able to do the surgery without the heart-lung bypass machine. That is called beating heart bypass surgery.  You may wake up with a tube in your throat to help your breathing. You may be connected to a breathing machine. You will not be able to talk while the tube is in place. The tube will be taken out as soon  as it is safe. This information is not intended to replace advice given to you by your health care provider. Make sure you discuss any questions you have with your health care provider. Document Released: 02/14/2005 Document Revised: 03/26/2016 Document Reviewed: 03/26/2016 Elsevier Interactive Patient Education  2018 [place].   Coronary Artery Bypass Grafting, Care After This sheet gives you information about how to care for yourself after your procedure. Your health care provider may also give you more specific instructions. If you have problems or questions, contact your health care provider. What can I expect after the procedure? After the procedure, it is common to have:  Nausea and a lack of appetite.  Constipation.  Weakness and fatigue.  Depression or irritability.  Pain or discomfort in your incision areas.  Follow these instructions at home: Medicines  Take over-the-counter and prescription  medicines only as told by your health care provider. Do not stop taking medicines or start any new medicines without approval from your health care provider.  If you were prescribed an antibiotic medicine, take it as told by your health care provider. Do not stop taking the antibiotic even if you start to feel better.  Do not drive or use heavy machinery while taking prescription pain medicine. Incision care  Follow instructions from your health care provider about how to take care of your incisions. Make sure you: ? Wash your hands with soap and water before you change your bandage (dressing). If soap and water are not available, use hand sanitizer. ? Change your dressing as told by your health care provider. ? Leave stitches (sutures), skin glue, or adhesive strips in place. These skin closures may need to stay in place for 2 weeks or longer. If adhesive strip edges start to loosen and curl up, you may trim the loose edges. Do not remove adhesive strips completely unless your health care provider tells you to do that.  Keep incision areas clean, dry, and protected.  Check your incision areas every day for signs of infection. Check for: ? More redness, swelling, or pain. ? More fluid or blood. ? Warmth. ? Pus or a bad smell.  If incisions were made in your legs: ? Avoid crossing your legs. ? Avoid sitting for long periods of time. Change positions every 30 minutes. ? Raise (elevate) your legs when you are sitting. Bathing  Do not take baths, swim, or use a hot tub until your health care provider approves.  Only take sponge baths. Pat the incisions dry. Do not rub incisions with a washcloth or towel.  Ask your health care provider when you can shower. Eating and drinking  Eat foods that are high in fiber, such as raw fruits and vegetables, whole grains, beans, and nuts. Meats should be lean cut. Avoid canned, processed, and fried foods. This can help prevent constipation and is a  recommended part of a heart-healthy diet.  Drink enough fluid to keep your urine clear or pale yellow.  Limit alcohol intake to no more than 1 drink a day for nonpregnant women and 2 drinks a day for men. One drink equals 12 oz of beer, 5 oz of wine, or 1 oz of hard liquor. Activity  Rest and limit your activity as told by your health care provider. You may be instructed to: ? Stop any activity right away if you have chest pain, shortness of breath, irregular heartbeats, or dizziness. Get help right away if you have any of these symptoms. ? Move around  frequently for short periods or take short walks as directed by your health care provider. Gradually increase your activities. You may need physical therapy or cardiac rehabilitation to help strengthen your muscles and build your endurance. ? Avoid lifting, pushing, or pulling anything that is heavier than 10 lb (4.5 kg) for at least 6 weeks or as told by your health care provider.  Do not drive until your health care provider approves.  Ask your health care provider when you may return to work.  Ask your health care provider when you may resume sexual activity. General instructions  Do not use any products that contain nicotine or tobacco, such as cigarettes and e-cigarettes. If you need help quitting, ask your health care provider.  Take 2-3 deep breaths every few hours during the day, while you recover. This helps expand your lungs and prevent complications like pneumonia after surgery.  If you were given a device called an incentive spirometer, use it several times a day to practice deep breathing. Support your chest with a pillow or your arms when you take deep breaths or cough.  Wear compression stockings as told by your health care provider. These stockings help to prevent blood clots and reduce swelling in your legs.  Weigh yourself every day. This helps identify if your body is holding (retaining) fluid that may make your heart and  lungs work harder.  Keep all follow-up visits as told by your health care provider. This is important. Contact a health care provider if:  You have more redness, swelling, or pain around any incision.  You have more fluid or blood coming from any incision.  Any incision feels warm to the touch.  You have pus or a bad smell coming from any incision  You have a fever.  You have swelling in your ankles or legs.  You have pain in your legs.  You gain 2 lb (0.9 kg) or more a day.  You are nauseous or you vomit.  You have diarrhea. Get help right away if:  You have chest pain that spreads to your jaw or arms.  You are short of breath.  You have a fast or irregular heartbeat.  You notice a "clicking" in your breastbone (sternum) when you move.  You have numbness or weakness in your arms or legs.  You feel dizzy or light-headed. Summary  After the procedure, it is common to have pain or discomfort in the incision areas.  Do not take baths, swim, or use a hot tub until your health care provider approves.  Gradually increase your activities. You may need physical therapy or cardiac rehabilitation to help strengthen your muscles and build your endurance.  Weigh yourself every day. This helps identify if your body is holding (retaining) fluid that may make your heart and lungs work harder. This information is not intended to replace advice given to you by your health care provider. Make sure you discuss any questions you have with your health care provider. Document Released: 11/24/2004 Document Revised: 03/26/2016 Document Reviewed: 03/26/2016 Elsevier Interactive Patient Education  [employer].

## 2018-03-20 NOTE — Progress Notes (Signed)
[name]       ,[place] [address]             [phone]                    Nathan Medina [place] Medical Record #[id number] Date of Birth: [birth year]  Referring: Marshell Garfinkel, MD Primary Care: [name], MD Primary Cardiologist: No primary care provider on file.  Chief Complaint:    Chief Complaint  Patient presents with  . Coronary Artery Disease    Further discuss surgery    History of Present Illness:    Nathan Medina 67 y.o. male previously seen in the office  today for evaluation of pulmonary groundglass opacity and right upper lobe,  this was was first noticed on a CT head and neck in March 2018 which showed a groundglass opacity in the right upper lobe.  He had a follow-up CT in March 2019 which showed increase in size, subsequent PET scan showed low uptake.  He was seen by Dr. Verdie Mosher, Pulmonary at [facility] who recommended surveillance.  He is now seen and referred by Dr. [name] repeats PET scan .    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  Since last seen cardiac catheterization has been performed and he has seen nephrology concerning the renal mass.    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 - TUMOR IS LIMITED TO THYROID. - RESECTION MARGIN IS POSITIVE. - NODULAR HYPERPLASIA. - SEE ONCOLOGY TABLE.  Patient had recent echocardiogram done at [facility] cardiovascular showing severe decrease in global wall motion abnormality ejection fraction 35 to 45% no mention of dilated a sending aorta or aortic insufficiency nuclear stress test suggests ejection fraction as low as 15% large area of basal to apical anterior apical septal perfusion defect on stress with moderate reversibility read as a high risk study   Current Activity/ Functional Status:  Patient is independent with  mobility/ambulation, transfers, ADL's, IADL's.   Zubrod Score: At the time of surgery this patient's most appropriate activity status/level should be described as: []     0    Normal activity, no symptoms [x]     1    Restricted in physical strenuous activity but ambulatory, able to do out light work []     2    Ambulatory and capable of self care, unable to do work activities, up and about               >50 % of waking hours                              []     3    Only limited self care, in bed greater than 50% of waking hours []     4    Completely disabled, no self care, confined to bed or chair []     5    Moribund   Past Medical History:  Diagnosis Date  . Abnormal liver function test 05/23/2011  . ALLERGIC RHINITIS   . ANXIETY   . Aortic atherosclerosis ([place])   . Arthritis   . Bilateral renal cysts   . Cervical spondylolysis    Moderate  . Chronic kidney disease    CKD stage 3 per office visit note 08/08/17 on chart   . COLONIC POLYPS, HX OF   .  Coronary artery disease   . DEPRESSION   . DIABETES MELLITUS, TYPE II   . Diverticulitis   . Epidermal cyst   . Fatty liver   . GERD   . GOUT   . History of inguinal hernia   . HOH (hard of hearing)    elft ear  . HYPERLIPIDEMIA   . HYPERTENSION   . IBS   . 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 Left renal mass 05/23/2011  . Lumbar radicular pain 06/03/2011  . Macular degeneration    right eye  . Multinodular goiter   . Pancreatic pseudocyst    Stable  . Peritonsillar abscess   . Pulmonary nodule    Right upper lobe  . Radial neck fracture   . Shortness of breath    occasional shortness of breath  with exertion  . Thyroid disease   . Thyroid nodule    Bilateral  . Vertigo     Past Surgical History:  Procedure Laterality Date  . APPENDECTOMY  02/12/11  . COLON SURGERY  08/11/10   sigmoid colectomy  . COLON SURGERY  02/12/11    colostomy takedown  . COLONOSCOPY    . CYST REMOVAL TRUNK Right 02/20/2018   Procedure: epidermal cyst excision right lower back;  Surgeon: [name], MD;  Location: [facility];  Service: General;  Laterality: Right;  . HERNIA REPAIR    . INSERTION OF MESH N/A 08/20/2012   Procedure: INSERTION OF MESH;  Surgeon: [name], MD;  Location: [facility];  Service: General;  Laterality: N/A;  . LEFT HEART CATH AND CORONARY ANGIOGRAPHY N/A 01/14/2018   Procedure: LEFT HEART CATH AND CORONARY ANGIOGRAPHY;  Surgeon: [name], MD;  Location: [facility];  Service: Cardiovascular;  Laterality: N/A;  . LYSIS OF ADHESION  08/20/2012   Procedure: LYSIS OF ADHESION;  Surgeon: [name], MD;  Location: [facility];  Service: General;;  . SEPTOPLASTY  age 35  . THYROIDECTOMY N/A 10/17/2017   Procedure: TOTAL THYROIDECTOMY;  Surgeon: [name], MD;  Location: [facility];  Service: General;  Laterality: N/A;  . TONSILLECTOMY Right 08/01/2016   Procedure: INCISION AND DRAINAGE RIGHT PERI-TONSILLAR ABSCESS;  Surgeon: [name], MD;  Location: [facility];  Service: ENT;  Laterality: Right;  . ULTRASOUND GUIDANCE FOR VASCULAR ACCESS  01/14/2018   Procedure: Ultrasound Guidance For Vascular Access;  Surgeon: [name], MD;  Location: [facility];  Service: Cardiovascular;;  . VENTRAL HERNIA REPAIR  08/20/2012   Procedure: HERNIA REPAIR VENTRAL ADULT;  Surgeon: [name], MD;  Location: [facility];  Service: General;;    Family History  Problem Relation Age of Onset  . Hypertension Paternal Aunt   . Stroke Maternal Grandmother   . Hypertension Maternal Grandmother      Social History   Tobacco Use  Smoking Status Never Smoker  Smokeless Tobacco Never Used    Social History   Substance and Sexual Activity  Alcohol Use Yes   Comment: occ     Allergies  Allergen Reactions  . Amoxicillin Other (See Comments)    Reaction:  Hiccups  Has patient had a PCN reaction causing  immediate rash, facial/tongue/throat swelling, SOB or lightheadedness with hypotension: No Has patient had a PCN reaction causing severe rash involving mucus membranes or skin necrosis: No Has patient had a PCN reaction that required hospitalization No Has patient had  a PCN reaction occurring within the last 10 years: No If all of the above answers are "NO", then may proceed with Cephalosporin use.  . Escitalopram Oxalate Itching  . Ciprofloxacin Rash  . Quinapril Hcl Rash  . Sulfa Antibiotics Swelling    Swelling in the ankles    Current Outpatient Medications  Medication Sig Dispense Refill  . acetaminophen (TYLENOL) 325 MG tablet Take 2 tablets (650 mg total) by mouth every 6 (six) hours as needed for mild pain (or Fever >/= 101). (Patient taking differently: Take 325-650 mg by mouth every 6 (six) hours as needed for mild pain (or Fever >/= 101). ) 20 tablet 0  . allopurinol (ZYLOPRIM) 300 MG tablet TAKE 1 TABLET BY MOUTH EVERY DAY (Patient taking differently: Take 300 mg by mouth daily. ) 90 tablet 0  . ALPRAZolam (XANAX) 0.5 MG tablet Take 1 tablet (0.5 mg total) by mouth daily as needed for sleep. 1/2 - 1 by mouth once daily as needed (Patient taking differently: Take 0.25-0.5 mg by mouth at bedtime as needed for sleep. ) 90 tablet 2  . aspirin EC 81 MG tablet Take 81 mg by mouth every other day.    . calcium carbonate (TUMS) 500 MG chewable tablet Chew 2 tablets (400 mg of elemental calcium total) by mouth 2 (two) times daily. (Patient taking differently: Chew 2 tablets by mouth 2 (two) times daily as needed for indigestion. ) 90 tablet 1  . carvedilol (COREG) 6.25 MG tablet Take 6.25 mg by mouth 2 (two) times daily with a meal.    . Cholecalciferol (VITAMIN D-3) 5000 units TABS Take 5,000 Units by mouth at bedtime.    . diphenhydrAMINE (BENADRYL) 25 MG tablet Take 25 mg by mouth at bedtime as needed for sleep.    . furosemide (LASIX) 40 MG tablet Take 40 mg by mouth daily as needed for  fluid or edema.     . Guaifenesin 1200 MG TB12 Take 1,200 mg by mouth 2 (two) times daily as needed (cough).    . insulin glargine (LANTUS) 100 unit/mL SOPN Inject 40 Units into the skin at bedtime.     . isosorbide-hydrALAZINE (BIDIL) 20-37.5 MG tablet Take 1 tablet by mouth 2 (two) times daily.    [name] levothyroxine (SYNTHROID, LEVOTHROID) 175 MCG tablet Take 175 mcg by mouth daily before breakfast.    . Multiple Vitamins-Minerals (PRESERVISION AREDS 2 PO) Take 1 tablet by mouth 2 (two) times daily.    [name] omega-3 acid ethyl esters (LOVAZA) 1 g capsule Take 2 g by mouth 2 (two) times daily.     [name] omeprazole (PRILOSEC) 20 MG capsule Take 20 mg by mouth daily before breakfast.     . traMADol (ULTRAM) 50 MG tablet Take 1-2 tablets (50-100 mg total) by mouth every 6 (six) hours as needed for moderate pain. 24 tablet 0  . triamcinolone cream (KENALOG) 0.1 % Apply 1 application topically 2 (two) times daily as needed (skin irritation).    . vitamin E (VITAMIN E) 1000 UNIT capsule Take 1,000 Units by mouth at bedtime.     No current facility-administered medications for this visit.     Pertinent items are noted in HPI.   Review of Systems:     Cardiac Review of Systems: [Y] = yes  or   [ N ] = no   Chest Pain [  N]  Resting SOB [ N ] Exertional SOB  [ Y ]  Orthopnea [ N ]  Pedal Edema [  N ]    Palpitations Aqua.Slicker ] Syncope  Aqua.Slicker  ]   Presyncope [  N ]   General Review of Systems: [Y] = yes [  ]=no Constitional: recent weight change [  ];  Wt loss over the last 3 months [   ] anorexia [  ]; fatigue [  ]; nausea [  ]; night sweats [  ]; fever [  ]; or chills [  ];           Eye : blurred vision [  ]; diplopia [   ]; vision changes [  ];  Amaurosis fugax[  ]; Resp: cough [  ];  wheezing[  ];  hemoptysis[  ]; shortness of breath[  ]; paroxysmal nocturnal dyspnea[  ]; dyspnea on exertion[  ]; or orthopnea[  ];  GI:  gallstones[  ], vomiting[  ];  dysphagia[  ]; melena[  ];  hematochezia [  ]; heartburn[  ];    Hx of  Colonoscopy[  ]; GU: kidney stones [  ]; hematuria[  ];   dysuria Blue.Reese  ];  nocturia[y  ];  history of     obstruction [  ]; urinary frequency Blue.Reese  ]             Skin: rash, swelling[  ];, hair loss[  ];  peripheral edema[  ];  or itching[  ]; Musculosketetal: myalgias[  ];  joint swelling[  ];  joint erythema[  ];  joint pain[y  ];  back pain[  ];  Heme/Lymph: bruising[  ];  bleeding[  ];  anemia[  ];  Neuro: TIA[  ];  headaches[  ];  stroke[  ];  vertigo[  ];  seizures[  ];   paresthesias[  ];  difficulty walking[  ];  Psych:depression[  ]; anxiety[  ];  Endocrine: diabetes[ y ];  thyroid dysfunction[  ];  Immunizations: Flu up to date [ Y ]; Pneumococcal up to date [facility] ];  Other:    PHYSICAL EXAMINATION: BP 112/73   Pulse 62   Resp 20   Ht 5\' 7"  (1.702 m)   Wt 194 lb (88 kg)   SpO2 97% Comment: RA  BMI 30.38 kg/m  General appearance: alert and cooperative Head: Normocephalic, without obvious abnormality, atraumatic Neck: no adenopathy, no carotid bruit, no JVD, supple, symmetrical, trachea midline and thyroid not enlarged, symmetric, no tenderness/mass/nodules Lymph nodes: Cervical, supraclavicular, and axillary nodes normal. Resp: clear to auscultation bilaterally Back: symmetric, no curvature. ROM normal. No CVA tenderness. Cardio: regular rate and rhythm, S1, S2 normal, no murmur, click, rub or gallop GI: soft, non-tender; bowel sounds normal; no masses,  no organomegaly Extremities: extremities normal, atraumatic, no cyanosis or edema and varicose veins noted Neurologic: Grossly normal  Diagnostic Studies & Laboratory data:     Recent Radiology Findings:   Nm Pet Image Initial (pi) Skull Base To Thigh  Result Date: 01/01/2018 CLINICAL DATA:  Subsequent treatment strategy for right lung nodule. EXAM: NUCLEAR MEDICINE PET SKULL BASE TO THIGH TECHNIQUE: 10.0 mCi F-18 FDG was injected intravenously. Full-ring PET imaging was performed from the skull base to thigh after  the radiotracer. CT data was obtained and used for attenuation correction and anatomic localization. Fasting blood glucose: 121 mg/dl COMPARISON:  08/26/2017 FINDINGS: (Reference/background mediastinal blood pool activity: SUV max = 2.5) NECK:  No hypermetabolic lymph nodes or masses. Incidental CT findings:  None. CHEST: No hypermetabolic masses or lymphadenopathy. A 1.5 cm ground-glass  nodule is seen in the posterior right lung apex which remains stable in size since previous studies. No solid component visualized. This does show mild FDG uptake with SUV of 1.7, without significant change compared to prior study. No new or enlarging pulmonary nodules identified. No evidence of pleural effusion. Incidental CT findings: Aortic and coronary artery atherosclerosis. Stable 4.1 cm ascending thoracic aortic aneurysm. ABDOMEN/PELVIS: No abnormal hypermetabolic activity within the liver, pancreas, adrenal glands, or spleen. No hypermetabolic lymph nodes in the abdomen or pelvis. A subcapsular lesion is seen in the lateral midpole of the left kidney which measures 2.4 cm. This shows FDG uptake with SUV max 3.4, suspicious for low-grade renal cell carcinoma. A fluid attenuation cystic lesion in the pancreatic tail measures 2.8 x 2.7 cm, and is stable compared to prior CT in 2013. This shows no FDG uptake, and is most consistent with a benign etiology such as an indolent cystic pancreatic neoplasm or pseudocyst. Incidental CT findings: Tiny calcified gallstones noted, without evidence of cholecystitis. Mildly enlarged prostate. SKELETON: No focal hypermetabolic bone lesions to suggest skeletal metastasis. Incidental CT findings:  None. IMPRESSION: 2.4 cm subcapsular lesion in left kidney with FDG uptake, highly suspicious for renal cell carcinoma. Recommend abdomen MRI without and with contrast for further evaluation. These results will be called to the ordering clinician or representative by the Radiologist Assistant, and  communication documented in the PACS or zVision Dashboard. Stable 1.5 cm ground-glass nodule in posterior right lung apex with low-grade FDG uptake. Low-grade adenocarcinoma cannot be excluded. Recommend continued follow-up by chest CT without contrast (rather than PET CT) in 12 months. This recommendation follows the consensus statement: Guidelines for Management of Small Pulmonary Nodules Detected on CT Images: From the Fleischner Society 2017; published online before print (10.1148/radiol.3818299371). Cystic lesion in pancreatic tail shows no FDG uptake and remains stable, consistent with an indolent cystic pancreatic neoplasm or pseudocyst. This can also be assessed by MRI. Stable 4.1 cm ascending thoracic aortic aneurysm. This can also be followed up by chest CT in 12 months. This recommendation follows 2010 ACCF/AHA/AATS/ACR/ASA/SCA/SCAI/SIR/STS/SVM Guidelines for the Diagnosis and Management of Patients with Thoracic Aortic Disease. Circulation. 2010; 121: I967-E938. Electronically Signed   By: Earle Gell M.D.   On: 01/01/2018 12:01     I have independently reviewed the above radiology studies  and reviewed the findings with the patient.   Recent Lab Findings: Lab Results  Component Value Date   WBC 5.8 02/14/2018   HGB 15.8 02/14/2018   HCT 47.4 02/14/2018   PLT 130 (L) 02/14/2018   GLUCOSE 88 02/14/2018   CHOL 190 04/01/2012   TRIG (H) 04/01/2012    525.0 Triglyceride is over 400; calculations on Lipids are invalid.   HDL 27.50 (L) 04/01/2012   LDLDIRECT 84.5 04/01/2012   LDLCALC 32 04/03/2010   ALT 31 08/01/2016   AST 21 08/01/2016   NA 145 02/14/2018   K 3.8 02/14/2018   CL 104 02/14/2018   CREATININE 1.56 (H) 02/14/2018   BUN 34 (H) 02/14/2018   CO2 30 02/14/2018   TSH 2.28 04/01/2012   HGBA1C 6.2 (H) 02/14/2018   Chronic Kidney Disease   Stage I     GFR >90  Stage II    GFR 60-89  Stage IIIA GFR 45-59  Stage IIIB GFR 30-44  Stage IV   GFR 15-29  Stage V    GFR   <15  Lab Results  Component Value Date   CREATININE 1.56 (H) 02/14/2018  CrCl cannot be calculated (Patient's most recent lab result is older than the maximum 21 days allowed.).  Pulmonary function studies done today Conclusions: The results are within normal limits. Pulmonary Function Diagnosis: Normal Pulmonary Function  FEV1 2.9  89% predicted DLCO 28. 85% predicted  Cath: Left Anterior Descending  Collaterals  Mid LAD filled by collaterals from Post Atrio.    Ost LAD to Prox LAD lesion 100% stenosed  Ost LAD to Prox LAD lesion is 100% stenosed. The lesion is severely calcified.  Mid LAD to Dist LAD lesion 90% stenosed  Mid LAD to Dist LAD lesion is 90% stenosed.  Dist LAD lesion 90% stenosed  Dist LAD lesion is 90% stenosed.  Ramus Intermedius  Vessel is large.  Ost Ramus lesion 75% stenosed  Ost Ramus lesion is 75% stenosed.  Ramus-1 lesion 80% stenosed  Ramus-1 lesion is 80% stenosed.  Ramus-2 lesion 60% stenosed  Ramus-2 lesion is 60% stenosed.  Left Circumflex  Vessel is normal in caliber. Mild proximal disease  Right Coronary Artery  Prox RCA lesion 40% stenosed  Prox RCA lesion is 40% stenosed.  Mid RCA lesion 70% stenosed  Mid RCA lesion is 70% stenosed. The lesion is focal.  Right Posterior Descending Artery  Ost RPDA to RPDA lesion 100% stenosed  Ost RPDA to RPDA lesion is 100% stenosed.   AO Systolic Pressure 578 mmHg  AO Diastolic Pressure 69 mmHg  AO Mean 91 mmHg  LV Systolic Pressure 469 mmHg  LV Diastolic Pressure 2 mmHg  LV EDP 15 mmHg  AOp Systolic Pressure 629 mmHg  AOp Diastolic Pressure 75 mmHg  AOp Mean Pressure 528 mmHg  LVp Systolic Pressure 413 mmHg  LVp Diastolic Pressure 4 mmHg  LVp EDP Pressure 15 mmHg      Assessment / Plan:   1/Stable 1.5 cm ground-glass nodule in posterior right lung apex with low-grade FDG uptake. Low-grade adenocarcinoma cannot be excluded. 2/2.4 cm subcapsular lesion in left kidney with FDG uptake,  highly suspicious for renal cell carcinoma.-Patient has seen urology and this lesion will continue to be followed 3/Stable 4.1 cm ascending thoracic aortic aneurysm. 4/history of papillary carcinoma of the thyroid-with positive resection margin 5/ high risk nuclear cardiology study done at [facility] cardiovascular dated 12/21/2010-patient has now had a cardiac catheterization which shows significant three-vessel disease as noted above including totally occluded LAD.  I discussed with the patient proceeding with coronary artery bypass grafting.  I have explained to him the process involved risks and options.  With history of decreased LV function and severe three-vessel coronary artery disease in a diabetic coronary artery bypass grafting would be his best option.   6/ stage III chronic kidney disease  The patient's previous visit and again today we discussed proceeding with coronary artery bypass grafting, possible wedge resection of the right upper lobe lung mass easily approachable.  The patient has a high syntax score severe three-vessel coronary artery disease high risk nuclear study and depressed LV function.  Prior to surgery will obtain a dated echocardiogram, vein mapping and preop labs to assess his renal function.  Risks and options of surgery including death infection stroke myocardial infarction bleeding blood transfusion of all been discussed with the patient in detail.  Grace Isaac MD      [address] [place] [address] Office [phone]   Beeper [phone]  03/20/2018 10:50 AM

## 2018-03-21 ENCOUNTER — Other Ambulatory Visit: Payer: Self-pay | Admitting: *Deleted

## 2018-03-21 ENCOUNTER — Encounter: Payer: Self-pay | Admitting: *Deleted

## 2018-03-21 DIAGNOSIS — I2510 Atherosclerotic heart disease of native coronary artery without angina pectoris: Secondary | ICD-10-CM

## 2018-03-24 ENCOUNTER — Ambulatory Visit (HOSPITAL_COMMUNITY)
Admission: RE | Admit: 2018-03-24 | Discharge: 2018-03-24 | Disposition: A | Discharge status: Home / Self Care | Payer: Medicare Other | Source: Ambulatory Visit | Attending: Cardiothoracic Surgery | Admitting: Cardiothoracic Surgery

## 2018-03-24 DIAGNOSIS — I129 Hypertensive chronic kidney disease with stage 1 through stage 4 chronic kidney disease, or unspecified chronic kidney disease: Secondary | ICD-10-CM | POA: Diagnosis not present

## 2018-03-24 DIAGNOSIS — E1122 Type 2 diabetes mellitus with diabetic chronic kidney disease: Secondary | ICD-10-CM | POA: Insufficient documentation

## 2018-03-24 DIAGNOSIS — Z85850 Personal history of malignant neoplasm of thyroid: Secondary | ICD-10-CM | POA: Diagnosis not present

## 2018-03-24 DIAGNOSIS — I2510 Atherosclerotic heart disease of native coronary artery without angina pectoris: Secondary | ICD-10-CM | POA: Insufficient documentation

## 2018-03-24 DIAGNOSIS — E785 Hyperlipidemia, unspecified: Secondary | ICD-10-CM | POA: Diagnosis not present

## 2018-03-24 DIAGNOSIS — Z01818 Encounter for other preprocedural examination: Secondary | ICD-10-CM | POA: Diagnosis present

## 2018-03-24 NOTE — Progress Notes (Signed)
  Echocardiogram 2D Echocardiogram has been performed.  Nathan Medina M 03/24/2018, 10:47 AM

## 2018-03-28 ENCOUNTER — Encounter (HOSPITAL_COMMUNITY): Payer: Self-pay

## 2018-03-28 ENCOUNTER — Ambulatory Visit (HOSPITAL_COMMUNITY)
Admission: RE | Admit: 2018-03-28 | Discharge: 2018-03-28 | Disposition: A | Discharge status: Home / Self Care | Payer: Medicare Other | Source: Ambulatory Visit | Attending: Cardiothoracic Surgery | Admitting: Cardiothoracic Surgery

## 2018-03-28 ENCOUNTER — Ambulatory Visit (HOSPITAL_BASED_OUTPATIENT_CLINIC_OR_DEPARTMENT_OTHER)
Admission: RE | Admit: 2018-03-28 | Discharge: 2018-03-28 | Disposition: A | Discharge status: Home / Self Care | Payer: Medicare Other | Source: Ambulatory Visit | Attending: Cardiothoracic Surgery | Admitting: Cardiothoracic Surgery

## 2018-03-28 ENCOUNTER — Encounter (HOSPITAL_COMMUNITY)
Admission: RE | Admit: 2018-03-28 | Discharge: 2018-03-28 | Disposition: A | Discharge status: Home / Self Care | Payer: Medicare Other | Source: Ambulatory Visit | Attending: Cardiothoracic Surgery | Admitting: Cardiothoracic Surgery

## 2018-03-28 ENCOUNTER — Other Ambulatory Visit: Payer: Self-pay

## 2018-03-28 DIAGNOSIS — I2510 Atherosclerotic heart disease of native coronary artery without angina pectoris: Secondary | ICD-10-CM | POA: Diagnosis not present

## 2018-03-28 DIAGNOSIS — Z01818 Encounter for other preprocedural examination: Secondary | ICD-10-CM | POA: Insufficient documentation

## 2018-03-28 LAB — CBC
HCT: 46.3 % (ref 39.0–52.0)
Hemoglobin: 15.4 g/dL (ref 13.0–17.0)
MCH: 31 pg (ref 26.0–34.0)
MCHC: 33.3 g/dL (ref 30.0–36.0)
MCV: 93.2 fL (ref 80.0–100.0)
Platelets: 141 10*3/uL — ABNORMAL LOW (ref 150–400)
RBC: 4.97 MIL/uL (ref 4.22–5.81)
RDW: 13.3 % (ref 11.5–15.5)
WBC: 7.2 10*3/uL (ref 4.0–10.5)
nRBC: 0 % (ref 0.0–0.2)

## 2018-03-28 LAB — URINALYSIS, ROUTINE W REFLEX MICROSCOPIC
Bacteria, UA: NONE SEEN
Bilirubin Urine: NEGATIVE
Glucose, UA: NEGATIVE mg/dL
Hgb urine dipstick: NEGATIVE
Ketones, ur: NEGATIVE mg/dL
Nitrite: NEGATIVE
Protein, ur: NEGATIVE mg/dL
Specific Gravity, Urine: 1.01 (ref 1.005–1.030)
pH: 6 (ref 5.0–8.0)

## 2018-03-28 LAB — COMPREHENSIVE METABOLIC PANEL
ALT: 34 U/L (ref 0–44)
AST: 26 U/L (ref 15–41)
Albumin: 3.5 g/dL (ref 3.5–5.0)
Alkaline Phosphatase: 119 U/L (ref 38–126)
Anion gap: 17 — ABNORMAL HIGH (ref 5–15)
BUN: 37 mg/dL — ABNORMAL HIGH (ref 8–23)
CO2: 18 mmol/L — ABNORMAL LOW (ref 22–32)
Calcium: 9.9 mg/dL (ref 8.9–10.3)
Chloride: 105 mmol/L (ref 98–111)
Creatinine, Ser: 1.57 mg/dL — ABNORMAL HIGH (ref 0.61–1.24)
GFR calc Af Amer: 51 mL/min — ABNORMAL LOW (ref >60)
GFR calc non Af Amer: 44 mL/min — ABNORMAL LOW (ref >60)
Glucose, Bld: 160 mg/dL — ABNORMAL HIGH (ref 70–99)
Potassium: 3.8 mmol/L (ref 3.5–5.1)
Sodium: 140 mmol/L (ref 135–145)
Total Bilirubin: 1.3 mg/dL — ABNORMAL HIGH (ref 0.3–1.2)
Total Protein: 7 g/dL (ref 6.5–8.1)

## 2018-03-28 LAB — BLOOD GAS, ARTERIAL
Acid-Base Excess: 3.5 mmol/L — ABNORMAL HIGH (ref 0.0–2.0)
Bicarbonate: 27.1 mmol/L (ref 20.0–28.0)
Drawn by: 47059
FIO2: 21
O2 Saturation: 97.9 %
Patient temperature: 98.6
pCO2 arterial: 38.1 mmHg (ref 32.0–48.0)
pH, Arterial: 7.465 — ABNORMAL HIGH (ref 7.350–7.450)
pO2, Arterial: 127 mmHg — ABNORMAL HIGH (ref 83.0–108.0)

## 2018-03-28 LAB — PROTIME-INR
INR: 1.21
Prothrombin Time: 15.1 seconds (ref 11.4–15.2)

## 2018-03-28 LAB — APTT: aPTT: 34 seconds (ref 24–36)

## 2018-03-28 LAB — SURGICAL PCR SCREEN
MRSA, PCR: NEGATIVE
Staphylococcus aureus: NEGATIVE

## 2018-03-28 LAB — GLUCOSE, CAPILLARY: Glucose-Capillary: 152 mg/dL — ABNORMAL HIGH (ref 70–99)

## 2018-03-28 LAB — TYPE AND SCREEN
ABO/RH(D): A NEG
Antibody Screen: NEGATIVE

## 2018-03-28 NOTE — Progress Notes (Signed)
PCP - Dr. [name]  Cardiologist - Dr. Vernell Leep  Chest x-ray - 03/28/18  EKG - 03/28/18  Stress Test - Denies  ECHO - 03/24/18 (E)  Cardiac Cath - 01/14/18 (E)  AICD- na PM- na LOOP- na  Sleep Study - Denies CPAP - None  LABS- 03/28/18: CBC, CMP, PT, PTT, ABG, T/S, PCR, UA  ASA- Continue  HA1C- 02/14/18: 6.2 (E) Fasting Blood Sugar - 71-136, today 152 Checks Blood Sugar __2___ times a day  Anesthesia- Yes-cardiac history  Pt denies having chest pain, sob, or fever at this time. All instructions explained to the pt, with a verbal understanding of the material. Pt agrees to go over the instructions while at home for a better understanding. The opportunity to ask questions was provided.

## 2018-03-28 NOTE — Progress Notes (Signed)
Pre-op Cardiac Surgery  Carotid Findings:  Bilateral ICAs demonstrate normal in velocities. Bilateral vertebral arteries are patent with antegrade flow. Vessel tortuosity noticed.   Upper Extremity Right Left  Brachial Pressures Unable to obtain due to insulin pump on site 123  Radial Waveforms Triphasic Triphasic  Ulnar Waveforms Triphasic Triphasic  Palmar Arch (Allen's Test) See below See below   Findings:   Right Upper Extremity: Doppler waveforms remain within normal limits with right radial compression. Doppler waveforms remain within normal limits with right ulnar compression.  Left Upper Extremity: Doppler waveforms remain within normal limits with left radial compression. Doppler waveforms decrease 50% with left ulnar compression.   Lower  Extremity Right Left  Dorsalis Pedis Triphasic Triphasic  Posterior Tibial Triphasic Triphasic  Ankle/Brachial Indices 1.22 1.24   Findings:   Resting bilateral ankle-brachial indecis are within normal range. No evidence of significant bilateral lower extremities arterial disease. Jerrion Tabbert H Jocabed Cheese(RDMS RVT) 03/28/18 11:29 AM

## 2018-03-28 NOTE — Pre-Procedure Instructions (Signed)
Nathan Medina  03/28/2018    Your procedure is scheduled on Tuesday, April 01, 2018 at 7:30 AM.   Report to [facility] Entrance "A" Admitting Office at 5:30 AM.   Call this number if you have problems the morning of surgery: [phone]   Questions prior to day of surgery, please call [phone] between 8 & 4 PM.   Remember:  Do not eat or drink after midnight Monday, 03/31/18  Take these medicines the morning of surgery with A SIP OF WATER: Carvedilol (Coreg), Isosorbide-Hydralazine (Bidil), Levothyroxine (Synthroid), Omeprazole (Prilosec), Tylenol - if needed.  Stop Fish Oil, Multivitamins, Vitamin E and NSAIDS (Ibuprofen, Aleve, etc) as of today.   Monday evening, 03/31/18 take 1/2 of your regular dose of Lantus Insulin, you will take 20 units.   How to Manage Your Diabetes Before Surgery   Why is it important to control my blood sugar before and after surgery?   Improving blood sugar levels before and after surgery helps healing and can limit problems.  A way of improving blood sugar control is eating a healthy diet by:  - Eating less sugar and carbohydrates  - Increasing activity/exercise  - Talk with your doctor about reaching your blood sugar goals  High blood sugars (greater than 180 mg/dL) can raise your risk of infections and slow down your recovery so you will need to focus on controlling your diabetes during the weeks before surgery.  Make sure that the doctor who takes care of your diabetes knows about your planned surgery including the date and location.  How do I manage my blood sugars before surgery?   Check your blood sugar at least 4 times a day, 2 days before surgery to make sure that they are not too high or low.  Check your blood sugar the morning of your surgery when you wake up and every 2 hours until you get to the Short-Stay unit.  Treat a low blood sugar (less than 70 mg/dL) with 1/2 cup of clear juice (cranberry or apple), 4  glucose tablets, OR glucose gel.  Recheck blood sugar in 15 minutes after treatment (to make sure it is greater than 70 mg/dL).  If blood sugar is not greater than 70 mg/dL on re-check, call [phone] for further instructions.   Report your blood sugar to the Short-Stay nurse when you get to Short-Stay.  References:  [facility], 2007 "How to Manage your Diabetes Before and After Surgery".    Do not wear jewelry.  Do not wear lotions, powders, cologne or deodorant.  Men may shave face and neck.  Do not bring valuables to the hospital.  [facility] is not responsible for any belongings or valuables.  Contacts, dentures or bridgework may not be worn into surgery.  Leave your suitcase in the car.  After surgery it may be brought to your room.  For patients admitted to the hospital, discharge time will be determined by your treatment team.  [facility] - Preparing for Surgery  Before surgery, you can play an important role.  Because skin is not sterile, your skin needs to be as free of germs as possible.  You can reduce the number of germs on you skin by washing with CHG (chlorahexidine gluconate) soap before surgery.  CHG is an antiseptic cleaner which kills germs and bonds with the skin to continue killing germs even after washing.  Oral Hygiene is also important in reducing the risk of infection.  Remember  to brush your teeth with your regular toothpaste the morning of surgery.  Please DO NOT use if you have an allergy to CHG or antibacterial soaps.  If your skin becomes reddened/irritated stop using the CHG and inform your nurse when you arrive at Short Stay.  Do not shave (including legs and underarms) for at least 48 hours prior to the first CHG shower.  You may shave your face.  Please follow these instructions carefully:   1.  Shower with CHG Soap the night before surgery and the morning of Surgery.  2.  If you choose to wash your hair, wash your hair  first as usual with your normal shampoo.  3.  After you shampoo, rinse your hair and body thoroughly to remove the shampoo. 4.  Use CHG as you would any other liquid soap.  You can apply chg directly to the skin and wash gently with a      scrungie or washcloth.           5.  Apply the CHG Soap to your body ONLY FROM THE NECK DOWN.   Do not use on open wounds or open sores. Avoid contact with your eyes, ears, mouth and genitals (private parts).  Wash genitals (private parts) with your normal soap.  6.  Wash thoroughly, paying special attention to the area where your surgery will be performed.  7.  Thoroughly rinse your body with warm water from the neck down.  8.  DO NOT shower/wash with your normal soap after using and rinsing off the CHG Soap.  9.  Pat yourself dry with a clean towel.            10.  Wear clean pajamas.            11.  Place clean sheets on your bed the night of your first shower and do not sleep with pets.  Day of Surgery  Shower as above. Do not apply any lotions/deodorants the morning of surgery.   Please wear clean clothes to the hospital. Remember to brush your teeth with toothpaste.   Please read over the fact sheets that you were given.

## 2018-03-28 NOTE — Progress Notes (Signed)
Preliminary notes--Bilateral GSV mapping completed.     Nathan Medina [name](RDMS RVT) 03/28/18 11:36 AM

## 2018-03-31 MED ORDER — PLASMA-LYTE 148 IV SOLN
INTRAVENOUS | Status: AC
  Administered 2018-04-01: 500 mL
  Filled 2018-03-31: qty 2.5

## 2018-03-31 MED ORDER — DEXMEDETOMIDINE HCL IN NACL 400 MCG/100ML IV SOLN
0.1000 ug/kg/h | INTRAVENOUS | Status: AC
  Administered 2018-04-01: .2 ug/kg/h via INTRAVENOUS
  Filled 2018-03-31: qty 100

## 2018-03-31 MED ORDER — TRANEXAMIC ACID (OHS) PUMP PRIME SOLUTION
2.0000 mg/kg | INTRAVENOUS | Status: DC
  Filled 2018-03-31: qty 1.78

## 2018-03-31 MED ORDER — NOREPINEPHRINE 4 MG/250ML-% IV SOLN
0.0000 ug/min | INTRAVENOUS | Status: DC
  Filled 2018-03-31 (×2): qty 250

## 2018-03-31 MED ORDER — SODIUM CHLORIDE 0.9 % IV SOLN
1.5000 g | INTRAVENOUS | Status: AC
  Administered 2018-04-01: 1.5 g via INTRAVENOUS
  Filled 2018-03-31: qty 1.5

## 2018-03-31 MED ORDER — INSULIN REGULAR(HUMAN) IN NACL 100-0.9 UT/100ML-% IV SOLN
INTRAVENOUS | Status: AC
  Administered 2018-04-01: 1 [IU]/h via INTRAVENOUS
  Filled 2018-03-31: qty 100

## 2018-03-31 MED ORDER — TRANEXAMIC ACID (OHS) BOLUS VIA INFUSION
15.0000 mg/kg | INTRAVENOUS | Status: AC
  Administered 2018-04-01: 1336.5 mg via INTRAVENOUS
  Filled 2018-03-31: qty 1337

## 2018-03-31 MED ORDER — SODIUM CHLORIDE 0.9 % IV SOLN
INTRAVENOUS | Status: DC
  Filled 2018-03-31: qty 30

## 2018-03-31 MED ORDER — SODIUM CHLORIDE 0.9 % IV SOLN
750.0000 mg | INTRAVENOUS | Status: DC
  Filled 2018-03-31: qty 750

## 2018-03-31 MED ORDER — PHENYLEPHRINE HCL-NACL 20-0.9 MG/250ML-% IV SOLN
30.0000 ug/min | INTRAVENOUS | Status: AC
  Administered 2018-04-01: 15 ug/min via INTRAVENOUS
  Filled 2018-03-31 (×2): qty 250

## 2018-03-31 MED ORDER — VANCOMYCIN HCL 10 G IV SOLR
1500.0000 mg | INTRAVENOUS | Status: AC
  Administered 2018-04-01: 1500 mg via INTRAVENOUS
  Filled 2018-03-31: qty 1500

## 2018-03-31 MED ORDER — NITROGLYCERIN IN D5W 200-5 MCG/ML-% IV SOLN
2.0000 ug/min | INTRAVENOUS | Status: AC
  Administered 2018-04-01: 5 ug/min via INTRAVENOUS
  Filled 2018-03-31: qty 250

## 2018-03-31 MED ORDER — TRANEXAMIC ACID 1000 MG/10ML IV SOLN
1.5000 mg/kg/h | INTRAVENOUS | Status: AC
  Administered 2018-04-01: 1.5 mg/kg/h via INTRAVENOUS
  Filled 2018-03-31: qty 25

## 2018-03-31 MED ORDER — MAGNESIUM SULFATE 50 % IJ SOLN
40.0000 meq | INTRAMUSCULAR | Status: DC
  Filled 2018-03-31: qty 9.85

## 2018-03-31 MED ORDER — POTASSIUM CHLORIDE 2 MEQ/ML IV SOLN
80.0000 meq | INTRAVENOUS | Status: DC
  Filled 2018-03-31: qty 40

## 2018-03-31 MED ORDER — EPINEPHRINE PF 1 MG/ML IJ SOLN
0.0000 ug/min | INTRAVENOUS | Status: DC
  Filled 2018-03-31: qty 4

## 2018-03-31 MED ORDER — MILRINONE LACTATE IN DEXTROSE 20-5 MG/100ML-% IV SOLN
0.3000 ug/kg/min | INTRAVENOUS | Status: AC
  Administered 2018-04-01: 0.3 ug/kg/min via INTRAVENOUS
  Filled 2018-03-31: qty 100

## 2018-03-31 MED ORDER — DOPAMINE-DEXTROSE 3.2-5 MG/ML-% IV SOLN
0.0000 ug/kg/min | INTRAVENOUS | Status: AC
  Administered 2018-04-01: 3 ug/kg/min via INTRAVENOUS
  Filled 2018-03-31: qty 250

## 2018-04-01 ENCOUNTER — Inpatient Hospital Stay (HOSPITAL_COMMUNITY): Payer: Medicare Other | Admitting: Certified Registered Nurse Anesthetist

## 2018-04-01 ENCOUNTER — Inpatient Hospital Stay (HOSPITAL_COMMUNITY): Payer: Medicare Other

## 2018-04-01 ENCOUNTER — Inpatient Hospital Stay (HOSPITAL_COMMUNITY)
Admission: RE | Admit: 2018-04-01 | Discharge: 2018-04-09 | DRG: 236 | Disposition: A | Discharge status: Home Health | Payer: Medicare Other | Attending: Cardiothoracic Surgery | Admitting: Cardiothoracic Surgery

## 2018-04-01 ENCOUNTER — Inpatient Hospital Stay (HOSPITAL_COMMUNITY)
Admission: RE | Disposition: A | Discharge status: Home Health | Payer: Self-pay | Source: Home / Self Care | Attending: Cardiothoracic Surgery

## 2018-04-01 ENCOUNTER — Other Ambulatory Visit: Payer: Self-pay

## 2018-04-01 ENCOUNTER — Inpatient Hospital Stay (HOSPITAL_COMMUNITY): Payer: Medicare Other | Admitting: Physician Assistant

## 2018-04-01 ENCOUNTER — Encounter (HOSPITAL_COMMUNITY): Payer: Self-pay | Admitting: Surgery

## 2018-04-01 DIAGNOSIS — I2510 Atherosclerotic heart disease of native coronary artery without angina pectoris: Secondary | ICD-10-CM | POA: Diagnosis present

## 2018-04-01 DIAGNOSIS — I700 Atherosclerosis of aorta: Secondary | ICD-10-CM | POA: Diagnosis present

## 2018-04-01 DIAGNOSIS — E1122 Type 2 diabetes mellitus with diabetic chronic kidney disease: Secondary | ICD-10-CM | POA: Diagnosis present

## 2018-04-01 DIAGNOSIS — E890 Postprocedural hypothyroidism: Secondary | ICD-10-CM | POA: Diagnosis present

## 2018-04-01 DIAGNOSIS — Z86010 Personal history of colonic polyps: Secondary | ICD-10-CM

## 2018-04-01 DIAGNOSIS — Z85850 Personal history of malignant neoplasm of thyroid: Secondary | ICD-10-CM

## 2018-04-01 DIAGNOSIS — J811 Chronic pulmonary edema: Secondary | ICD-10-CM | POA: Diagnosis present

## 2018-04-01 DIAGNOSIS — Z9049 Acquired absence of other specified parts of digestive tract: Secondary | ICD-10-CM

## 2018-04-01 DIAGNOSIS — I493 Ventricular premature depolarization: Secondary | ICD-10-CM | POA: Diagnosis not present

## 2018-04-01 DIAGNOSIS — Z9689 Presence of other specified functional implants: Secondary | ICD-10-CM

## 2018-04-01 DIAGNOSIS — N2889 Other specified disorders of kidney and ureter: Secondary | ICD-10-CM | POA: Diagnosis present

## 2018-04-01 DIAGNOSIS — Z882 Allergy status to sulfonamides status: Secondary | ICD-10-CM

## 2018-04-01 DIAGNOSIS — E785 Hyperlipidemia, unspecified: Secondary | ICD-10-CM | POA: Diagnosis present

## 2018-04-01 DIAGNOSIS — Z09 Encounter for follow-up examination after completed treatment for conditions other than malignant neoplasm: Secondary | ICD-10-CM

## 2018-04-01 DIAGNOSIS — M109 Gout, unspecified: Secondary | ICD-10-CM | POA: Diagnosis present

## 2018-04-01 DIAGNOSIS — D62 Acute posthemorrhagic anemia: Secondary | ICD-10-CM | POA: Diagnosis not present

## 2018-04-01 DIAGNOSIS — I2582 Chronic total occlusion of coronary artery: Secondary | ICD-10-CM | POA: Diagnosis present

## 2018-04-01 DIAGNOSIS — Z8249 Family history of ischemic heart disease and other diseases of the circulatory system: Secondary | ICD-10-CM

## 2018-04-01 DIAGNOSIS — K760 Fatty (change of) liver, not elsewhere classified: Secondary | ICD-10-CM | POA: Diagnosis present

## 2018-04-01 DIAGNOSIS — K219 Gastro-esophageal reflux disease without esophagitis: Secondary | ICD-10-CM | POA: Diagnosis present

## 2018-04-01 DIAGNOSIS — R911 Solitary pulmonary nodule: Secondary | ICD-10-CM | POA: Diagnosis present

## 2018-04-01 DIAGNOSIS — J9811 Atelectasis: Secondary | ICD-10-CM | POA: Diagnosis not present

## 2018-04-01 DIAGNOSIS — Z951 Presence of aortocoronary bypass graft: Secondary | ICD-10-CM

## 2018-04-01 DIAGNOSIS — D6959 Other secondary thrombocytopenia: Secondary | ICD-10-CM | POA: Diagnosis not present

## 2018-04-01 DIAGNOSIS — F419 Anxiety disorder, unspecified: Secondary | ICD-10-CM | POA: Diagnosis present

## 2018-04-01 DIAGNOSIS — I4891 Unspecified atrial fibrillation: Secondary | ICD-10-CM | POA: Diagnosis not present

## 2018-04-01 DIAGNOSIS — N183 Chronic kidney disease, stage 3 (moderate): Secondary | ICD-10-CM | POA: Diagnosis present

## 2018-04-01 DIAGNOSIS — I129 Hypertensive chronic kidney disease with stage 1 through stage 4 chronic kidney disease, or unspecified chronic kidney disease: Secondary | ICD-10-CM | POA: Diagnosis present

## 2018-04-01 DIAGNOSIS — Z881 Allergy status to other antibiotic agents status: Secondary | ICD-10-CM

## 2018-04-01 DIAGNOSIS — Z9089 Acquired absence of other organs: Secondary | ICD-10-CM

## 2018-04-01 DIAGNOSIS — Z888 Allergy status to other drugs, medicaments and biological substances status: Secondary | ICD-10-CM

## 2018-04-01 DIAGNOSIS — Z794 Long term (current) use of insulin: Secondary | ICD-10-CM

## 2018-04-01 DIAGNOSIS — J90 Pleural effusion, not elsewhere classified: Secondary | ICD-10-CM | POA: Diagnosis present

## 2018-04-01 DIAGNOSIS — K863 Pseudocyst of pancreas: Secondary | ICD-10-CM | POA: Diagnosis present

## 2018-04-01 DIAGNOSIS — I712 Thoracic aortic aneurysm, without rupture: Secondary | ICD-10-CM | POA: Diagnosis present

## 2018-04-01 DIAGNOSIS — Z79899 Other long term (current) drug therapy: Secondary | ICD-10-CM

## 2018-04-01 DIAGNOSIS — Z823 Family history of stroke: Secondary | ICD-10-CM

## 2018-04-01 HISTORY — PX: TEE WITHOUT CARDIOVERSION: SHX5443

## 2018-04-01 HISTORY — PX: CORONARY ARTERY BYPASS GRAFT: SHX141

## 2018-04-01 LAB — CREATININE, SERUM
Creatinine, Ser: 1.22 mg/dL (ref 0.61–1.24)
GFR calc Af Amer: >60 mL/min (ref >60)
GFR calc non Af Amer: 60 mL/min — ABNORMAL LOW (ref >60)

## 2018-04-01 LAB — POCT I-STAT, CHEM 8
BUN: 30 mg/dL — ABNORMAL HIGH (ref 8–23)
BUN: 26 mg/dL — ABNORMAL HIGH (ref 8–23)
BUN: 30 mg/dL — ABNORMAL HIGH (ref 8–23)
BUN: 27 mg/dL — ABNORMAL HIGH (ref 8–23)
BUN: 26 mg/dL — ABNORMAL HIGH (ref 8–23)
BUN: 24 mg/dL — ABNORMAL HIGH (ref 8–23)
Calcium, Ion: 1.28 mmol/L (ref 1.15–1.40)
Calcium, Ion: 1.04 mmol/L — ABNORMAL LOW (ref 1.15–1.40)
Calcium, Ion: 1.23 mmol/L (ref 1.15–1.40)
Calcium, Ion: 1.13 mmol/L — ABNORMAL LOW (ref 1.15–1.40)
Calcium, Ion: 1.21 mmol/L (ref 1.15–1.40)
Calcium, Ion: 1.25 mmol/L (ref 1.15–1.40)
Chloride: 104 mmol/L (ref 98–111)
Chloride: 102 mmol/L (ref 98–111)
Chloride: 105 mmol/L (ref 98–111)
Chloride: 101 mmol/L (ref 98–111)
Chloride: 106 mmol/L (ref 98–111)
Chloride: 109 mmol/L (ref 98–111)
Creatinine, Ser: 1.2 mg/dL (ref 0.61–1.24)
Creatinine, Ser: 1 mg/dL (ref 0.61–1.24)
Creatinine, Ser: 1.2 mg/dL (ref 0.61–1.24)
Creatinine, Ser: 1.1 mg/dL (ref 0.61–1.24)
Creatinine, Ser: 1.1 mg/dL (ref 0.61–1.24)
Creatinine, Ser: 1.1 mg/dL (ref 0.61–1.24)
Glucose, Bld: 141 mg/dL — ABNORMAL HIGH (ref 70–99)
Glucose, Bld: 139 mg/dL — ABNORMAL HIGH (ref 70–99)
Glucose, Bld: 155 mg/dL — ABNORMAL HIGH (ref 70–99)
Glucose, Bld: 202 mg/dL — ABNORMAL HIGH (ref 70–99)
Glucose, Bld: 230 mg/dL — ABNORMAL HIGH (ref 70–99)
Glucose, Bld: 126 mg/dL — ABNORMAL HIGH (ref 70–99)
HCT: 37 % — ABNORMAL LOW (ref 39.0–52.0)
HCT: 32 % — ABNORMAL LOW (ref 39.0–52.0)
HCT: 34 % — ABNORMAL LOW (ref 39.0–52.0)
HCT: 30 % — ABNORMAL LOW (ref 39.0–52.0)
HCT: 33 % — ABNORMAL LOW (ref 39.0–52.0)
HCT: 37 % — ABNORMAL LOW (ref 39.0–52.0)
Hemoglobin: 12.6 g/dL — ABNORMAL LOW (ref 13.0–17.0)
Hemoglobin: 10.9 g/dL — ABNORMAL LOW (ref 13.0–17.0)
Hemoglobin: 11.6 g/dL — ABNORMAL LOW (ref 13.0–17.0)
Hemoglobin: 10.2 g/dL — ABNORMAL LOW (ref 13.0–17.0)
Hemoglobin: 11.2 g/dL — ABNORMAL LOW (ref 13.0–17.0)
Hemoglobin: 12.6 g/dL — ABNORMAL LOW (ref 13.0–17.0)
Potassium: 3.3 mmol/L — ABNORMAL LOW (ref 3.5–5.1)
Potassium: 3.4 mmol/L — ABNORMAL LOW (ref 3.5–5.1)
Potassium: 3.6 mmol/L (ref 3.5–5.1)
Potassium: 3.7 mmol/L (ref 3.5–5.1)
Potassium: 3.5 mmol/L (ref 3.5–5.1)
Potassium: 3.7 mmol/L (ref 3.5–5.1)
Sodium: 142 mmol/L (ref 135–145)
Sodium: 142 mmol/L (ref 135–145)
Sodium: 142 mmol/L (ref 135–145)
Sodium: 138 mmol/L (ref 135–145)
Sodium: 141 mmol/L (ref 135–145)
Sodium: 143 mmol/L (ref 135–145)
TCO2: 29 mmol/L (ref 22–32)
TCO2: 26 mmol/L (ref 22–32)
TCO2: 29 mmol/L (ref 22–32)
TCO2: 26 mmol/L (ref 22–32)
TCO2: 23 mmol/L (ref 22–32)
TCO2: 24 mmol/L (ref 22–32)

## 2018-04-01 LAB — POCT I-STAT 3, ART BLOOD GAS (G3+)
Acid-Base Excess: 2 mmol/L (ref 0.0–2.0)
Acid-base deficit: 2 mmol/L (ref 0.0–2.0)
Acid-base deficit: 3 mmol/L — ABNORMAL HIGH (ref 0.0–2.0)
Acid-base deficit: 12 mmol/L — ABNORMAL HIGH (ref 0.0–2.0)
Acid-base deficit: 3 mmol/L — ABNORMAL HIGH (ref 0.0–2.0)
Acid-base deficit: 2 mmol/L (ref 0.0–2.0)
Bicarbonate: 27.2 mmol/L (ref 20.0–28.0)
Bicarbonate: 23.4 mmol/L (ref 20.0–28.0)
Bicarbonate: 22.8 mmol/L (ref 20.0–28.0)
Bicarbonate: 12.7 mmol/L — ABNORMAL LOW (ref 20.0–28.0)
Bicarbonate: 20.1 mmol/L (ref 20.0–28.0)
Bicarbonate: 23.2 mmol/L (ref 20.0–28.0)
O2 Saturation: 100 %
O2 Saturation: 99 %
O2 Saturation: 94 %
O2 Saturation: 97 %
O2 Saturation: 98 %
O2 Saturation: 89 %
Patient temperature: 35.5
Patient temperature: 36.4
Patient temperature: 36.8
Patient temperature: 37.5
TCO2: 28 mmol/L (ref 22–32)
TCO2: 25 mmol/L (ref 22–32)
TCO2: 24 mmol/L (ref 22–32)
TCO2: 13 mmol/L — ABNORMAL LOW (ref 22–32)
TCO2: 21 mmol/L — ABNORMAL LOW (ref 22–32)
TCO2: 24 mmol/L (ref 22–32)
pCO2 arterial: 43.5 mmHg (ref 32.0–48.0)
pCO2 arterial: 44 mmHg (ref 32.0–48.0)
pCO2 arterial: 40.7 mmHg (ref 32.0–48.0)
pCO2 arterial: 24.3 mmHg — ABNORMAL LOW (ref 32.0–48.0)
pCO2 arterial: 28.3 mmHg — ABNORMAL LOW (ref 32.0–48.0)
pCO2 arterial: 40.2 mmHg (ref 32.0–48.0)
pH, Arterial: 7.404 (ref 7.350–7.450)
pH, Arterial: 7.335 — ABNORMAL LOW (ref 7.350–7.450)
pH, Arterial: 7.349 — ABNORMAL LOW (ref 7.350–7.450)
pH, Arterial: 7.325 — ABNORMAL LOW (ref 7.350–7.450)
pH, Arterial: 7.456 — ABNORMAL HIGH (ref 7.350–7.450)
pH, Arterial: 7.372 (ref 7.350–7.450)
pO2, Arterial: 413 mmHg — ABNORMAL HIGH (ref 83.0–108.0)
pO2, Arterial: 131 mmHg — ABNORMAL HIGH (ref 83.0–108.0)
pO2, Arterial: 69 mmHg — ABNORMAL LOW (ref 83.0–108.0)
pO2, Arterial: 101 mmHg (ref 83.0–108.0)
pO2, Arterial: 93 mmHg (ref 83.0–108.0)
pO2, Arterial: 60 mmHg — ABNORMAL LOW (ref 83.0–108.0)

## 2018-04-01 LAB — POCT I-STAT 4, (NA,K, GLUC, HGB,HCT)
Glucose, Bld: 165 mg/dL — ABNORMAL HIGH (ref 70–99)
HCT: 36 % — ABNORMAL LOW (ref 39.0–52.0)
Hemoglobin: 12.2 g/dL — ABNORMAL LOW (ref 13.0–17.0)
Potassium: 3.8 mmol/L (ref 3.5–5.1)
Sodium: 143 mmol/L (ref 135–145)

## 2018-04-01 LAB — CBC
HCT: 39 % (ref 39.0–52.0)
HCT: 38.3 % — ABNORMAL LOW (ref 39.0–52.0)
Hemoglobin: 12.5 g/dL — ABNORMAL LOW (ref 13.0–17.0)
Hemoglobin: 12.7 g/dL — ABNORMAL LOW (ref 13.0–17.0)
MCH: 30.4 pg (ref 26.0–34.0)
MCH: 30.9 pg (ref 26.0–34.0)
MCHC: 32.1 g/dL (ref 30.0–36.0)
MCHC: 33.2 g/dL (ref 30.0–36.0)
MCV: 94.9 fL (ref 80.0–100.0)
MCV: 93.2 fL (ref 80.0–100.0)
Platelets: UNDETERMINED 10*3/uL (ref 150–400)
Platelets: 104 10*3/uL — ABNORMAL LOW (ref 150–400)
RBC: 4.11 MIL/uL — ABNORMAL LOW (ref 4.22–5.81)
RBC: 4.11 MIL/uL — ABNORMAL LOW (ref 4.22–5.81)
RDW: 13.2 % (ref 11.5–15.5)
RDW: 13.2 % (ref 11.5–15.5)
WBC: 15 10*3/uL — ABNORMAL HIGH (ref 4.0–10.5)
WBC: 12.4 10*3/uL — ABNORMAL HIGH (ref 4.0–10.5)
nRBC: 0 % (ref 0.0–0.2)
nRBC: 0 % (ref 0.0–0.2)

## 2018-04-01 LAB — GLUCOSE, CAPILLARY
Glucose-Capillary: 148 mg/dL — ABNORMAL HIGH (ref 70–99)
Glucose-Capillary: 106 mg/dL — ABNORMAL HIGH (ref 70–99)
Glucose-Capillary: 86 mg/dL (ref 70–99)
Glucose-Capillary: 98 mg/dL (ref 70–99)
Glucose-Capillary: 109 mg/dL — ABNORMAL HIGH (ref 70–99)
Glucose-Capillary: 120 mg/dL — ABNORMAL HIGH (ref 70–99)
Glucose-Capillary: 135 mg/dL — ABNORMAL HIGH (ref 70–99)
Glucose-Capillary: 127 mg/dL — ABNORMAL HIGH (ref 70–99)

## 2018-04-01 LAB — HEMOGLOBIN AND HEMATOCRIT, BLOOD
HCT: 31.4 % — ABNORMAL LOW (ref 39.0–52.0)
Hemoglobin: 10.4 g/dL — ABNORMAL LOW (ref 13.0–17.0)

## 2018-04-01 LAB — PROTIME-INR
INR: 1.55
Prothrombin Time: 18.4 seconds — ABNORMAL HIGH (ref 11.4–15.2)

## 2018-04-01 LAB — PLATELET COUNT: Platelets: 114 10*3/uL — ABNORMAL LOW (ref 150–400)

## 2018-04-01 LAB — MAGNESIUM: Magnesium: 2.1 mg/dL (ref 1.7–2.4)

## 2018-04-01 LAB — APTT: aPTT: 38 seconds — ABNORMAL HIGH (ref 24–36)

## 2018-04-01 SURGERY — CORONARY ARTERY BYPASS GRAFTING (CABG)
Anesthesia: General | Site: Chest | Laterality: Right

## 2018-04-01 MED ORDER — DEXMEDETOMIDINE HCL IN NACL 200 MCG/50ML IV SOLN
0.0000 ug/kg/h | INTRAVENOUS | Status: DC
  Administered 2018-04-01: 0.5 ug/kg/h via INTRAVENOUS

## 2018-04-01 MED ORDER — OXYCODONE HCL 5 MG PO TABS
5.0000 mg | ORAL_TABLET | ORAL | Status: DC | PRN
  Administered 2018-04-02: 10 mg via ORAL
  Administered 2018-04-02: 5 mg via ORAL
  Administered 2018-04-02: 10 mg via ORAL
  Filled 2018-04-01 (×2): qty 2
  Filled 2018-04-01: qty 1

## 2018-04-01 MED ORDER — PANTOPRAZOLE SODIUM 40 MG PO TBEC: 40.0000 mg | DELAYED_RELEASE_TABLET | Freq: Every day | ORAL | Status: DC

## 2018-04-01 MED ORDER — BISACODYL 10 MG RE SUPP: 10.0000 mg | Freq: Every day | RECTAL | Status: DC

## 2018-04-01 MED ORDER — ACETAMINOPHEN 650 MG RE SUPP
650.0000 mg | Freq: Once | RECTAL | Status: AC
  Administered 2018-04-01: 650 mg via RECTAL

## 2018-04-01 MED ORDER — MILRINONE LACTATE IN DEXTROSE 20-5 MG/100ML-% IV SOLN
0.1500 ug/kg/min | INTRAVENOUS | Status: AC
  Administered 2018-04-01: 0.3 ug/kg/min via INTRAVENOUS
  Administered 2018-04-02 – 2018-04-03 (×2): 0.15 ug/kg/min via INTRAVENOUS
  Filled 2018-04-01 (×3): qty 100

## 2018-04-01 MED ORDER — SODIUM CHLORIDE 0.9 % IV SOLN: INTRAVENOUS | Status: DC

## 2018-04-01 MED ORDER — MIDAZOLAM HCL 5 MG/5ML IJ SOLN
INTRAMUSCULAR | Status: DC | PRN
  Administered 2018-04-01 (×5): 2 mg via INTRAVENOUS

## 2018-04-01 MED ORDER — MORPHINE SULFATE (PF) 2 MG/ML IV SOLN: 2.0000 mg | INTRAVENOUS | Status: DC | PRN

## 2018-04-01 MED ORDER — CHLORHEXIDINE GLUCONATE 4 % EX LIQD: 30.0000 mL | CUTANEOUS | Status: DC

## 2018-04-01 MED ORDER — HEMOSTATIC AGENTS (NO CHARGE) OPTIME
TOPICAL | Status: DC | PRN
  Administered 2018-04-01: 1 via TOPICAL

## 2018-04-01 MED ORDER — SODIUM CHLORIDE 0.9 % IV SOLN: 250.0000 mL | INTRAVENOUS | Status: DC

## 2018-04-01 MED ORDER — ATORVASTATIN CALCIUM 10 MG PO TABS
10.0000 mg | ORAL_TABLET | Freq: Every day | ORAL | Status: DC
  Administered 2018-04-01 – 2018-04-08 (×8): 10 mg via ORAL
  Filled 2018-04-01 (×8): qty 1

## 2018-04-01 MED ORDER — PHENYLEPHRINE HCL 10 MG/ML IJ SOLN
INTRAMUSCULAR | Status: AC
  Filled 2018-04-01: qty 1

## 2018-04-01 MED ORDER — LEVOTHYROXINE SODIUM 75 MCG PO TABS
175.0000 ug | ORAL_TABLET | Freq: Every day | ORAL | Status: DC
  Administered 2018-04-02 – 2018-04-09 (×8): 175 ug via ORAL
  Filled 2018-04-01 (×8): qty 1

## 2018-04-01 MED ORDER — FENTANYL CITRATE (PF) 250 MCG/5ML IJ SOLN
INTRAMUSCULAR | Status: DC | PRN
  Administered 2018-04-01: 50 ug via INTRAVENOUS
  Administered 2018-04-01 (×2): 100 ug via INTRAVENOUS
  Administered 2018-04-01: 150 ug via INTRAVENOUS
  Administered 2018-04-01: 25 ug via INTRAVENOUS
  Administered 2018-04-01: 50 ug via INTRAVENOUS
  Administered 2018-04-01: 75 ug via INTRAVENOUS
  Administered 2018-04-01: 100 ug via INTRAVENOUS
  Administered 2018-04-01: 150 ug via INTRAVENOUS
  Administered 2018-04-01: 100 ug via INTRAVENOUS
  Administered 2018-04-01: 50 ug via INTRAVENOUS

## 2018-04-01 MED ORDER — ALBUMIN HUMAN 5 % IV SOLN
250.0000 mL | INTRAVENOUS | Status: AC | PRN
  Administered 2018-04-01 (×2): 12.5 g via INTRAVENOUS

## 2018-04-01 MED ORDER — SODIUM CHLORIDE 0.9% FLUSH: 3.0000 mL | INTRAVENOUS | Status: DC | PRN

## 2018-04-01 MED ORDER — ROCURONIUM BROMIDE 10 MG/ML (PF) SYRINGE
PREFILLED_SYRINGE | INTRAVENOUS | Status: DC | PRN
  Administered 2018-04-01: 30 mg via INTRAVENOUS
  Administered 2018-04-01: 90 mg via INTRAVENOUS
  Administered 2018-04-01: 20 mg via INTRAVENOUS

## 2018-04-01 MED ORDER — FAMOTIDINE IN NACL 20-0.9 MG/50ML-% IV SOLN: 20.0000 mg | Freq: Two times a day (BID) | INTRAVENOUS | Status: AC

## 2018-04-01 MED ORDER — POTASSIUM CHLORIDE 10 MEQ/50ML IV SOLN
10.0000 meq | INTRAVENOUS | Status: AC
  Administered 2018-04-01 (×2): 10 meq via INTRAVENOUS

## 2018-04-01 MED ORDER — SODIUM CHLORIDE 0.45 % IV SOLN: INTRAVENOUS | Status: DC | PRN

## 2018-04-01 MED ORDER — SODIUM CHLORIDE 0.9 % IV SOLN
INTRAVENOUS | Status: DC | PRN
  Administered 2018-04-01: 20 ug/min via INTRAVENOUS

## 2018-04-01 MED ORDER — INSULIN REGULAR BOLUS VIA INFUSION
0.0000 [IU] | Freq: Three times a day (TID) | INTRAVENOUS | Status: DC
  Filled 2018-04-01: qty 10

## 2018-04-01 MED ORDER — ROCURONIUM BROMIDE 50 MG/5ML IV SOSY
PREFILLED_SYRINGE | INTRAVENOUS | Status: AC
  Filled 2018-04-01: qty 10

## 2018-04-01 MED ORDER — LACTATED RINGERS IV SOLN
INTRAVENOUS | Status: DC | PRN
  Administered 2018-04-01: 07:00:00 via INTRAVENOUS

## 2018-04-01 MED ORDER — PROPOFOL 10 MG/ML IV BOLUS
INTRAVENOUS | Status: DC | PRN
  Administered 2018-04-01: 70 mg via INTRAVENOUS

## 2018-04-01 MED ORDER — SODIUM CHLORIDE 0.9 % IV SOLN
1.5000 g | Freq: Two times a day (BID) | INTRAVENOUS | Status: AC
  Administered 2018-04-01 – 2018-04-03 (×4): 1.5 g via INTRAVENOUS
  Filled 2018-04-01 (×5): qty 1.5

## 2018-04-01 MED ORDER — SODIUM CHLORIDE (PF) 0.9 % IJ SOLN
INTRAMUSCULAR | Status: AC
  Filled 2018-04-01: qty 10

## 2018-04-01 MED ORDER — PROPOFOL 10 MG/ML IV BOLUS
INTRAVENOUS | Status: AC
  Filled 2018-04-01: qty 20

## 2018-04-01 MED ORDER — FENTANYL CITRATE (PF) 250 MCG/5ML IJ SOLN
INTRAMUSCULAR | Status: AC
  Filled 2018-04-01: qty 25

## 2018-04-01 MED ORDER — SODIUM BICARBONATE 8.4 % IV SOLN
50.0000 meq | Freq: Once | INTRAVENOUS | Status: AC
  Administered 2018-04-01: 50 meq via INTRAVENOUS

## 2018-04-01 MED ORDER — TRAMADOL HCL 50 MG PO TABS
50.0000 mg | ORAL_TABLET | ORAL | Status: DC | PRN
  Administered 2018-04-02 (×2): 100 mg via ORAL
  Filled 2018-04-01 (×2): qty 2

## 2018-04-01 MED ORDER — METOPROLOL TARTRATE 12.5 MG HALF TABLET: 12.5000 mg | ORAL_TABLET | Freq: Once | ORAL | Status: DC

## 2018-04-01 MED ORDER — NITROGLYCERIN IN D5W 200-5 MCG/ML-% IV SOLN: 0.0000 ug/min | INTRAVENOUS | Status: DC

## 2018-04-01 MED ORDER — CHLORHEXIDINE GLUCONATE 0.12 % MT SOLN
15.0000 mL | OROMUCOSAL | Status: AC
  Administered 2018-04-01: 15 mL via OROMUCOSAL

## 2018-04-01 MED ORDER — ASPIRIN 81 MG PO CHEW
324.0000 mg | CHEWABLE_TABLET | Freq: Every day | ORAL | Status: DC
  Administered 2018-04-09: 324 mg
  Filled 2018-04-01 (×2): qty 4

## 2018-04-01 MED ORDER — HEPARIN SODIUM (PORCINE) 1000 UNIT/ML IJ SOLN
INTRAMUSCULAR | Status: AC
  Filled 2018-04-01: qty 1

## 2018-04-01 MED ORDER — PHENYLEPHRINE HCL-NACL 20-0.9 MG/250ML-% IV SOLN: 0.0000 ug/min | INTRAVENOUS | Status: DC

## 2018-04-01 MED ORDER — BISACODYL 5 MG PO TBEC
10.0000 mg | DELAYED_RELEASE_TABLET | Freq: Every day | ORAL | Status: DC
  Administered 2018-04-02 – 2018-04-08 (×7): 10 mg via ORAL
  Filled 2018-04-01 (×8): qty 2

## 2018-04-01 MED ORDER — DOCUSATE SODIUM 100 MG PO CAPS
200.0000 mg | ORAL_CAPSULE | Freq: Every day | ORAL | Status: DC
  Administered 2018-04-02 – 2018-04-08 (×7): 200 mg via ORAL
  Filled 2018-04-01 (×8): qty 2

## 2018-04-01 MED ORDER — MAGNESIUM SULFATE 4 GM/100ML IV SOLN: 4.0000 g | Freq: Once | INTRAVENOUS | Status: DC

## 2018-04-01 MED ORDER — METOPROLOL TARTRATE 5 MG/5ML IV SOLN
2.5000 mg | INTRAVENOUS | Status: DC | PRN
  Administered 2018-04-03 (×3): 5 mg via INTRAVENOUS
  Filled 2018-04-01 (×2): qty 5

## 2018-04-01 MED ORDER — CHLORHEXIDINE GLUCONATE 0.12 % MT SOLN
OROMUCOSAL | Status: AC
  Administered 2018-04-01: 15 mL via OROMUCOSAL
  Filled 2018-04-01: qty 15

## 2018-04-01 MED ORDER — ACETAMINOPHEN 160 MG/5ML PO SOLN
650.0000 mg | Freq: Once | ORAL | Status: AC
  Filled 2018-04-01: qty 20.3

## 2018-04-01 MED ORDER — ROCURONIUM BROMIDE 50 MG/5ML IV SOSY
PREFILLED_SYRINGE | INTRAVENOUS | Status: AC
  Filled 2018-04-01: qty 5

## 2018-04-01 MED ORDER — LACTATED RINGERS IV SOLN: 500.0000 mL | Freq: Once | INTRAVENOUS | Status: DC | PRN

## 2018-04-01 MED ORDER — HEPARIN SODIUM (PORCINE) 1000 UNIT/ML IJ SOLN
INTRAMUSCULAR | Status: DC | PRN
  Administered 2018-04-01: 32000 [IU] via INTRAVENOUS

## 2018-04-01 MED ORDER — CALCIUM CHLORIDE 10 % IV SOLN
INTRAVENOUS | Status: DC | PRN
  Administered 2018-04-01 (×2): .3 g via INTRAVENOUS

## 2018-04-01 MED ORDER — 0.9 % SODIUM CHLORIDE (POUR BTL) OPTIME
TOPICAL | Status: DC | PRN
  Administered 2018-04-01: 6000 mL

## 2018-04-01 MED ORDER — METOPROLOL TARTRATE 12.5 MG HALF TABLET
12.5000 mg | ORAL_TABLET | Freq: Two times a day (BID) | ORAL | Status: DC
  Administered 2018-04-02 – 2018-04-05 (×3): 12.5 mg via ORAL
  Filled 2018-04-01 (×5): qty 1

## 2018-04-01 MED ORDER — METOPROLOL TARTRATE 25 MG/10 ML ORAL SUSPENSION: 12.5000 mg | Freq: Two times a day (BID) | ORAL | Status: DC

## 2018-04-01 MED ORDER — SODIUM CHLORIDE 0.9% FLUSH
3.0000 mL | Freq: Two times a day (BID) | INTRAVENOUS | Status: DC
  Administered 2018-04-02 – 2018-04-04 (×4): 3 mL via INTRAVENOUS

## 2018-04-01 MED ORDER — PROTAMINE SULFATE 10 MG/ML IV SOLN
INTRAVENOUS | Status: AC
  Filled 2018-04-01: qty 5

## 2018-04-01 MED ORDER — CHLORHEXIDINE GLUCONATE 0.12 % MT SOLN
15.0000 mL | Freq: Once | OROMUCOSAL | Status: AC
  Administered 2018-04-01: 15 mL via OROMUCOSAL

## 2018-04-01 MED ORDER — POTASSIUM CHLORIDE 10 MEQ/50ML IV SOLN
10.0000 meq | INTRAVENOUS | Status: AC | PRN
  Administered 2018-04-01 (×3): 10 meq via INTRAVENOUS
  Filled 2018-04-01 (×3): qty 50

## 2018-04-01 MED ORDER — ALLOPURINOL 300 MG PO TABS
300.0000 mg | ORAL_TABLET | Freq: Every day | ORAL | Status: DC
  Administered 2018-04-02 – 2018-04-09 (×8): 300 mg via ORAL
  Filled 2018-04-01 (×8): qty 1

## 2018-04-01 MED ORDER — MAGNESIUM SULFATE 2 GM/50ML IV SOLN
2.0000 g | Freq: Once | INTRAVENOUS | Status: AC
  Administered 2018-04-01: 2 g via INTRAVENOUS

## 2018-04-01 MED ORDER — LACTATED RINGERS IV SOLN: INTRAVENOUS | Status: DC

## 2018-04-01 MED ORDER — ACETAMINOPHEN 160 MG/5ML PO SOLN: 1000.0000 mg | Freq: Four times a day (QID) | ORAL | Status: AC

## 2018-04-01 MED ORDER — ACETAMINOPHEN 500 MG PO TABS
1000.0000 mg | ORAL_TABLET | Freq: Four times a day (QID) | ORAL | Status: AC
  Administered 2018-04-02 – 2018-04-06 (×20): 1000 mg via ORAL
  Filled 2018-04-01 (×20): qty 2

## 2018-04-01 MED ORDER — MIDAZOLAM HCL 2 MG/2ML IJ SOLN: 2.0000 mg | INTRAMUSCULAR | Status: DC | PRN

## 2018-04-01 MED ORDER — PANTOPRAZOLE SODIUM 40 MG PO TBEC
40.0000 mg | DELAYED_RELEASE_TABLET | Freq: Every day | ORAL | Status: DC
  Administered 2018-04-03 – 2018-04-09 (×7): 40 mg via ORAL
  Filled 2018-04-01 (×7): qty 1

## 2018-04-01 MED ORDER — ONDANSETRON HCL 4 MG/2ML IJ SOLN: 4.0000 mg | Freq: Four times a day (QID) | INTRAMUSCULAR | Status: DC | PRN

## 2018-04-01 MED ORDER — MIDAZOLAM HCL (PF) 10 MG/2ML IJ SOLN
INTRAMUSCULAR | Status: AC
  Filled 2018-04-01: qty 2

## 2018-04-01 MED ORDER — CALCIUM CHLORIDE 10 % IV SOLN
INTRAVENOUS | Status: AC
  Filled 2018-04-01: qty 10

## 2018-04-01 MED ORDER — VANCOMYCIN HCL IN DEXTROSE 1-5 GM/200ML-% IV SOLN
1000.0000 mg | Freq: Once | INTRAVENOUS | Status: AC
  Administered 2018-04-01: 1000 mg via INTRAVENOUS
  Filled 2018-04-01: qty 200

## 2018-04-01 MED ORDER — EPHEDRINE 5 MG/ML INJ
INTRAVENOUS | Status: AC
  Filled 2018-04-01: qty 10

## 2018-04-01 MED ORDER — OMEGA-3-ACID ETHYL ESTERS 1 G PO CAPS
2.0000 g | ORAL_CAPSULE | Freq: Two times a day (BID) | ORAL | Status: DC
  Administered 2018-04-02 – 2018-04-03 (×2): 2 g via ORAL
  Filled 2018-04-01 (×4): qty 2

## 2018-04-01 MED ORDER — DEXMEDETOMIDINE HCL IN NACL 200 MCG/50ML IV SOLN
INTRAVENOUS | Status: AC
  Filled 2018-04-01: qty 50

## 2018-04-01 MED ORDER — PROTAMINE SULFATE 10 MG/ML IV SOLN
INTRAVENOUS | Status: DC | PRN
  Administered 2018-04-01: 320 mg via INTRAVENOUS

## 2018-04-01 MED ORDER — ALPRAZOLAM 0.5 MG PO TABS: 0.5000 mg | ORAL_TABLET | Freq: Every day | ORAL | Status: DC | PRN

## 2018-04-01 MED ORDER — MORPHINE SULFATE (PF) 2 MG/ML IV SOLN
1.0000 mg | INTRAVENOUS | Status: AC | PRN
  Administered 2018-04-01: 2 mg via INTRAVENOUS
  Filled 2018-04-01: qty 1

## 2018-04-01 MED ORDER — SODIUM CHLORIDE 0.9 % IV SOLN
INTRAVENOUS | Status: DC | PRN
  Administered 2018-04-01: 750 mg via INTRAVENOUS

## 2018-04-01 MED ORDER — PROTAMINE SULFATE 10 MG/ML IV SOLN
INTRAVENOUS | Status: AC
  Filled 2018-04-01: qty 25

## 2018-04-01 MED ORDER — GLYCOPYRROLATE 0.2 MG/ML IJ SOLN
INTRAMUSCULAR | Status: DC | PRN
  Administered 2018-04-01: 0.2 mg via INTRAVENOUS

## 2018-04-01 MED ORDER — METOCLOPRAMIDE HCL 5 MG/ML IJ SOLN
10.0000 mg | Freq: Four times a day (QID) | INTRAMUSCULAR | Status: AC
  Administered 2018-04-01 – 2018-04-02 (×4): 10 mg via INTRAVENOUS
  Filled 2018-04-01 (×3): qty 2

## 2018-04-01 MED ORDER — DOPAMINE-DEXTROSE 3.2-5 MG/ML-% IV SOLN
1.2500 ug/kg/min | INTRAVENOUS | Status: DC
  Administered 2018-04-04: 1.25 ug/kg/min via INTRAVENOUS
  Filled 2018-04-01: qty 250

## 2018-04-01 MED ORDER — ALBUMIN HUMAN 5 % IV SOLN
INTRAVENOUS | Status: DC | PRN
  Administered 2018-04-01 (×2): via INTRAVENOUS

## 2018-04-01 MED ORDER — ASPIRIN EC 325 MG PO TBEC
325.0000 mg | DELAYED_RELEASE_TABLET | Freq: Every day | ORAL | Status: DC
  Administered 2018-04-02 – 2018-04-08 (×7): 325 mg via ORAL
  Filled 2018-04-01 (×8): qty 1

## 2018-04-01 MED ORDER — INSULIN REGULAR(HUMAN) IN NACL 100-0.9 UT/100ML-% IV SOLN
INTRAVENOUS | Status: DC
  Filled 2018-04-01: qty 100

## 2018-04-01 MED ORDER — SODIUM CHLORIDE (PF) 0.9 % IJ SOLN
OROMUCOSAL | Status: DC | PRN
  Administered 2018-04-01 (×2): 4 mL via TOPICAL

## 2018-04-01 SURGICAL SUPPLY — 77 items
ATTRACTOMAT 16X20 MAGNETIC DRP (DRAPES) ×4 IMPLANT
BAG DECANTER FOR FLEXI CONT (MISCELLANEOUS) ×4 IMPLANT
BANDAGE ACE 4X5 VEL STRL LF (GAUZE/BANDAGES/DRESSINGS) ×4 IMPLANT
BANDAGE ACE 6X5 VEL STRL LF (GAUZE/BANDAGES/DRESSINGS) ×4 IMPLANT
BANDAGE ELASTIC 4 VELCRO ST LF (GAUZE/BANDAGES/DRESSINGS) ×4 IMPLANT
BANDAGE ELASTIC 6 VELCRO ST LF (GAUZE/BANDAGES/DRESSINGS) ×4 IMPLANT
BLADE STERNUM SYSTEM 6 (BLADE) ×4 IMPLANT
BLADE SURG 11 STRL SS (BLADE) ×4 IMPLANT
BNDG GAUZE ELAST 4 BULKY (GAUZE/BANDAGES/DRESSINGS) ×4 IMPLANT
CANISTER SUCT 3000ML PPV (MISCELLANEOUS) ×4 IMPLANT
CATH CPB KIT GERHARDT (MISCELLANEOUS) ×4 IMPLANT
CATH THORACIC 28FR (CATHETERS) ×4 IMPLANT
COVER WAND RF STERILE (DRAPES) ×4 IMPLANT
CRADLE DONUT ADULT HEAD (MISCELLANEOUS) ×4 IMPLANT
DERMABOND ADVANCED (GAUZE/BANDAGES/DRESSINGS) ×1
DERMABOND ADVANCED .7 DNX12 (GAUZE/BANDAGES/DRESSINGS) ×3 IMPLANT
DRAIN CHANNEL 28F RND 3/8 FF (WOUND CARE) ×4 IMPLANT
DRAPE CARDIOVASCULAR INCISE (DRAPES) ×1
DRAPE SLUSH/WARMER DISC (DRAPES) ×4 IMPLANT
DRAPE SRG 135X102X78XABS (DRAPES) ×3 IMPLANT
DRSG AQUACEL AG ADV 3.5X14 (GAUZE/BANDAGES/DRESSINGS) ×4 IMPLANT
ELECT BLADE 4.0 EZ CLEAN MEGAD (MISCELLANEOUS) ×4
ELECT REM PT RETURN 9FT ADLT (ELECTROSURGICAL) ×8
ELECTRODE BLDE 4.0 EZ CLN MEGD (MISCELLANEOUS) ×3 IMPLANT
ELECTRODE REM PT RTRN 9FT ADLT (ELECTROSURGICAL) ×6 IMPLANT
FELT TEFLON 1X6 (MISCELLANEOUS) ×8 IMPLANT
GAUZE SPONGE 4X4 12PLY STRL (GAUZE/BANDAGES/DRESSINGS) ×4 IMPLANT
GAUZE SPONGE 4X4 12PLY STRL LF (GAUZE/BANDAGES/DRESSINGS) ×4 IMPLANT
GLOVE BIO SURGEON STRL SZ 6 (GLOVE) ×16 IMPLANT
GLOVE BIO SURGEON STRL SZ 6.5 (GLOVE) ×36 IMPLANT
GOWN STRL REUS W/ TWL LRG LVL3 (GOWN DISPOSABLE) ×21 IMPLANT
GOWN STRL REUS W/TWL LRG LVL3 (GOWN DISPOSABLE) ×7
HEMOSTAT POWDER KIT SURGIFOAM (HEMOSTASIS) ×8 IMPLANT
HEMOSTAT POWDER SURGIFOAM 1G (HEMOSTASIS) ×12 IMPLANT
HEMOSTAT SURGICEL 2X14 (HEMOSTASIS) ×4 IMPLANT
KIT BASIN OR (CUSTOM PROCEDURE TRAY) ×4 IMPLANT
KIT CATH SUCT 8FR (CATHETERS) ×4 IMPLANT
KIT SUCTION CATH 14FR (SUCTIONS) ×8 IMPLANT
KIT TURNOVER KIT B (KITS) ×4 IMPLANT
KIT VASOVIEW HEMOPRO 2 VH 4000 (KITS) ×4 IMPLANT
LEAD PACING MYOCARDI (MISCELLANEOUS) ×4 IMPLANT
LINE VENT (MISCELLANEOUS) ×4 IMPLANT
MARKER GRAFT CORONARY BYPASS (MISCELLANEOUS) ×12 IMPLANT
NS IRRIG 1000ML POUR BTL (IV SOLUTION) ×20 IMPLANT
PACK E OPEN HEART (SUTURE) ×4 IMPLANT
PACK OPEN HEART (CUSTOM PROCEDURE TRAY) ×4 IMPLANT
PAD ARMBOARD 7.5X6 YLW CONV (MISCELLANEOUS) ×8 IMPLANT
PAD ELECT DEFIB RADIOL ZOLL (MISCELLANEOUS) ×4 IMPLANT
PENCIL BUTTON HOLSTER BLD 10FT (ELECTRODE) ×4 IMPLANT
PUNCH AORTIC ROTATE  4.5MM 8IN (MISCELLANEOUS) ×4 IMPLANT
SET CARDIOPLEGIA MPS 5001102 (MISCELLANEOUS) ×4 IMPLANT
SOLUTION ANTI FOG 6CC (MISCELLANEOUS) ×4 IMPLANT
SPONGE LAP 18X18 X RAY DECT (DISPOSABLE) ×8 IMPLANT
SUT BONE WAX W31G (SUTURE) ×4 IMPLANT
SUT MNCRL AB 4-0 PS2 18 (SUTURE) ×4 IMPLANT
SUT PROLENE 3 0 SH1 36 (SUTURE) ×4 IMPLANT
SUT PROLENE 4 0 TF (SUTURE) ×8 IMPLANT
SUT PROLENE 6 0 C 1 30 (SUTURE) ×4 IMPLANT
SUT PROLENE 6 0 CC (SUTURE) ×12 IMPLANT
SUT PROLENE 7 0 BV 1 (SUTURE) ×8 IMPLANT
SUT PROLENE 7 0 BV1 MDA (SUTURE) ×4 IMPLANT
SUT PROLENE 8 0 BV175 6 (SUTURE) ×4 IMPLANT
SUT STEEL 6MS V (SUTURE) ×4 IMPLANT
SUT STEEL SZ 6 DBL 3X14 BALL (SUTURE) ×4 IMPLANT
SUT VIC AB 1 CTX 18 (SUTURE) ×8 IMPLANT
SUT VIC AB 2-0 CT1 27 (SUTURE) ×1
SUT VIC AB 2-0 CT1 TAPERPNT 27 (SUTURE) ×3 IMPLANT
SYSTEM SAHARA CHEST DRAIN ATS (WOUND CARE) ×4 IMPLANT
TAPE CLOTH SURG 4X10 WHT LF (GAUZE/BANDAGES/DRESSINGS) ×4 IMPLANT
TAPE PAPER 2X10 WHT MICROPORE (GAUZE/BANDAGES/DRESSINGS) ×4 IMPLANT
TOWEL GREEN STERILE (TOWEL DISPOSABLE) ×4 IMPLANT
TOWEL GREEN STERILE FF (TOWEL DISPOSABLE) ×4 IMPLANT
TRAY FOLEY SLVR 16FR TEMP STAT (SET/KITS/TRAYS/PACK) ×4 IMPLANT
TUBE SUCT INTRACARD DLP 20F (MISCELLANEOUS) ×4 IMPLANT
TUBING INSUFFLATION (TUBING) ×4 IMPLANT
UNDERPAD 30X30 (UNDERPADS AND DIAPERS) ×4 IMPLANT
WATER STERILE IRR 1000ML POUR (IV SOLUTION) ×8 IMPLANT

## 2018-04-01 NOTE — Procedures (Signed)
Extubation Procedure Note  Patient Details:   Name: JAHLEN BOLLMAN DOB: [birth year] MRN: [id number]   Airway Documentation:    Vent end date: 04/01/18 Vent end time: 1753   Evaluation  O2 sats: stable throughout Complications: No apparent complications Patient did tolerate procedure well. Bilateral Breath Sounds: Clear   Yes   Patient was extubated to a 4L [place] without any complications, dyspnea or stridor noted. Patient was instructed on IS x 5, highest goal achieved was 783mL. NIF: -24, VC: 1.1L.  Claretta Fraise 04/01/2018, 5:53  PM

## 2018-04-01 NOTE — Anesthesia Procedure Notes (Addendum)
Central Venous Catheter Insertion Performed by: Effie Berkshire, MD, anesthesiologist Start/End11/04/2018 7:10 AM, 04/01/2018 7:20 AM Patient location: Pre-op. Preanesthetic checklist: patient identified, IV checked, site marked, risks and benefits discussed, surgical consent, monitors and equipment checked, pre-op evaluation, timeout performed and anesthesia consent Lidocaine 1% used for infiltration and patient sedated Hand hygiene performed  and maximum sterile barriers used  Catheter size: 9 Fr MAC introducer Procedure performed using ultrasound guided technique. Ultrasound Notes:anatomy identified, needle tip was noted to be adjacent to the nerve/plexus identified, no ultrasound evidence of intravascular and/or intraneural injection and image(s) printed for medical record Attempts: 1 Following insertion, line sutured and dressing applied. Post procedure assessment: blood return through all ports, free fluid flow and no air  Patient tolerated the procedure well with no immediate complications.

## 2018-04-01 NOTE — Progress Notes (Signed)
T[facility] BRIEF SICU PROGRESS NOTE  Day of Surgery  S/P Procedure(s) (LRB): CORONARY ARTERY BYPASS GRAFTING (CABG) x Three, using left internal mammary artery and right leg greater saphenous vein harvested endoscopically (N/A) TRANSESOPHAGEAL ECHOCARDIOGRAM (TEE) (N/A)   Extubated uneventfully NSR w/ stable hemodynamics on dopamine milrinone and Neo drips Breathing comfortably Chest tube output low UOP adequate Labs okay  Plan: Continue routine early postop  Rexene Alberts, MD 04/01/2018 6:18 PM

## 2018-04-01 NOTE — Progress Notes (Signed)
  Echocardiogram Echocardiogram Transesophageal has been performed.  Dorotha Hirschi L Androw 04/01/2018, 8:23 AM

## 2018-04-01 NOTE — H&P (Signed)
[address]       [place] [address]             [phone]                    Jazen D Carrier [place] Medical Record #[id number] Date of Birth: 1951-03-11  Referring: No ref. provider found Primary Care: [name], MD Primary Cardiologist: No primary care provider on file.  Chief Complaint:    No chief complaint on file.   History of Present Illness:    Nathan Medina 67 y.o. male previously seen in the office  today for evaluation of pulmonary groundglass opacity and right upper lobe,  this was was first noticed on a CT head and neck in March 2018 which showed a groundglass opacity in the right upper lobe.  He had a follow-up CT in March 2019 which showed increase in size, subsequent PET scan showed low uptake.  He was seen by Dr. Verdie Mosher, Pulmonary at [facility] who recommended surveillance.  He is now seen and referred by Dr. [name] repeats PET scan .    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  Since last seen cardiac catheterization has been performed and he has seen nephrology concerning the renal mass.    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 - TUMOR IS LIMITED TO THYROID. - RESECTION MARGIN IS POSITIVE. - NODULAR HYPERPLASIA. - SEE ONCOLOGY TABLE.  Patient had recent echocardiogram done at [facility] cardiovascular showing severe decrease in global wall motion abnormality ejection fraction 35 to 45% no mention of dilated a sending aorta or aortic insufficiency nuclear stress test suggests ejection fraction as low as 15% large area of basal to apical anterior apical septal perfusion defect on stress with moderate reversibility read as a high risk study   Current Activity/ Functional Status:  Patient is independent with mobility/ambulation, transfers, ADL's, IADL's.   Zubrod Score: At the time  of surgery this patient's most appropriate activity status/level should be described as: []     0    Normal activity, no symptoms [x]     1    Restricted in physical strenuous activity but ambulatory, able to do out light work []     2    Ambulatory and capable of self care, unable to do work activities, up and about               >50 % of waking hours                              []     3    Only limited self care, in bed greater than 50% of waking hours []     4    Completely disabled, no self care, confined to bed or chair []     5    Moribund   Past Medical History:  Diagnosis Date  . Abnormal liver function test 05/23/2011  . ALLERGIC RHINITIS   . ANXIETY   . Aortic atherosclerosis ([place])   . Arthritis   . Bilateral renal cysts   . Cervical spondylolysis    Moderate  . Chronic kidney disease    CKD stage 3 per office visit note 08/08/17 on chart   . COLONIC POLYPS, HX OF   . Coronary artery disease   . DEPRESSION   .  DIABETES MELLITUS, TYPE II   . Diverticulitis   . Epidermal cyst   . Fatty liver   . GERD   . GOUT   . History of inguinal hernia   . HOH (hard of hearing)    elft ear  . HYPERLIPIDEMIA   . HYPERTENSION   . IBS   . 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 Left renal mass 05/23/2011  . Lumbar radicular pain 06/03/2011  . Macular degeneration    right eye  . Multinodular goiter   . Pancreatic pseudocyst    Stable  . Peritonsillar abscess   . Pulmonary nodule    Right upper lobe  . Radial neck fracture   . Shortness of breath    occasional shortness of breath  with exertion  . Thyroid disease   . Thyroid nodule    Bilateral  . Vertigo     Past Surgical History:  Procedure Laterality Date  . APPENDECTOMY  02/12/11  . COLON SURGERY  08/11/10   sigmoid colectomy  . COLON SURGERY  02/12/11   colostomy takedown  . COLONOSCOPY    . CYST REMOVAL TRUNK Right 02/20/2018    Procedure: epidermal cyst excision right lower back;  Surgeon: [name], MD;  Location: [facility];  Service: General;  Laterality: Right;  . HERNIA REPAIR    . INSERTION OF MESH N/A 08/20/2012   Procedure: INSERTION OF MESH;  Surgeon: [name], MD;  Location: [facility];  Service: General;  Laterality: N/A;  . LEFT HEART CATH AND CORONARY ANGIOGRAPHY N/A 01/14/2018   Procedure: LEFT HEART CATH AND CORONARY ANGIOGRAPHY;  Surgeon: [name], MD;  Location: [facility];  Service: Cardiovascular;  Laterality: N/A;  . LYSIS OF ADHESION  08/20/2012   Procedure: LYSIS OF ADHESION;  Surgeon: [name], MD;  Location: [facility];  Service: General;;  . SEPTOPLASTY  age 1  . THYROIDECTOMY N/A 10/17/2017   Procedure: TOTAL THYROIDECTOMY;  Surgeon: [name], MD;  Location: [facility];  Service: General;  Laterality: N/A;  . TONSILLECTOMY Right 08/01/2016   Procedure: INCISION AND DRAINAGE RIGHT PERI-TONSILLAR ABSCESS;  Surgeon: [name], MD;  Location: [facility];  Service: ENT;  Laterality: Right;  . ULTRASOUND GUIDANCE FOR VASCULAR ACCESS  01/14/2018   Procedure: Ultrasound Guidance For Vascular Access;  Surgeon: [name], MD;  Location: [facility];  Service: Cardiovascular;;  . VENTRAL HERNIA REPAIR  08/20/2012   Procedure: HERNIA REPAIR VENTRAL ADULT;  Surgeon: [name], MD;  Location: [facility];  Service: General;;    Family History  Problem Relation Age of Onset  . Hypertension Paternal Aunt   . Stroke Maternal Grandmother   . Hypertension Maternal Grandmother      Social History   Tobacco Use  Smoking Status Never Smoker  Smokeless Tobacco Never Used    Social History   Substance and Sexual Activity  Alcohol Use Yes   Comment: occ     Allergies  Allergen Reactions  . Ciprofloxacin Rash  . Escitalopram Oxalate Itching  . Quinapril Hcl Rash  . Sulfa Antibiotics Swelling    Swelling in the ankles    Current Facility-Administered  Medications  Medication Dose Route Frequency Provider Last Rate Last Dose  . cefUROXime (ZINACEF) 1.5 g in sodium chloride 0.9 % 100 mL IVPB  1.5 g Intravenous To OR Grace Isaac, MD      .  cefUROXime (ZINACEF) 750 mg in sodium chloride 0.9 % 100 mL IVPB  750 mg Intravenous To OR Grace Isaac, MD      . chlorhexidine (HIBICLENS) 4 % liquid 2 application  30 mL Topical UD Grace Isaac, MD      . dexmedetomidine (PRECEDEX) 400 MCG/100ML (4 mcg/mL) infusion  0.1-0.7 mcg/kg/hr Intravenous To OR Grace Isaac, MD      . DOPamine (INTROPIN) 800 mg in dextrose 5 % 250 mL (3.2 mg/mL) infusion  0-10 mcg/kg/min Intravenous To OR Grace Isaac, MD      . EPINEPHrine (ADRENALIN) 4 mg in dextrose 5 % 250 mL (0.016 mg/mL) infusion  0-10 mcg/min Intravenous To OR Grace Isaac, MD      . heparin 2,500 Units, papaverine 30 mg in electrolyte-148 (PLASMALYTE-148) 500 mL irrigation   Irrigation To OR Grace Isaac, MD      . heparin 30,000 units/NS 1000 mL solution for CELLSAVER   Other To OR Grace Isaac, MD      . insulin regular, human (MYXREDLIN) 100 units/ 100 mL infusion   Intravenous To OR Grace Isaac, MD      . magnesium sulfate (IV Push/IM) injection 40 mEq  40 mEq Other To OR Grace Isaac, MD      . metoprolol tartrate (LOPRESSOR) tablet 12.5 mg  12.5 mg Oral Once Grace Isaac, MD      . milrinone (PRIMACOR) 20 MG/100 ML (0.2 mg/mL) infusion  0.3 mcg/kg/min Intravenous To OR Grace Isaac, MD      . nitroGLYCERIN 50 mg in dextrose 5 % 250 mL (0.2 mg/mL) infusion  2-200 mcg/min Intravenous To OR Grace Isaac, MD      . norepinephrine (LEVOPHED) 4mg  in D5W 234mL premix infusion  0-40 mcg/min Intravenous Titrated Grace Isaac, MD      . phenylephrine (NEOSYNEPHRINE) 20-0.9 MG/250ML-% infusion  30-200 mcg/min Intravenous To OR Grace Isaac, MD      . potassium chloride injection 80 mEq  80 mEq Other To OR Grace Isaac, MD       . tranexamic acid (CYKLOKAPRON) 2,500 mg in sodium chloride 0.9 % 250 mL (10 mg/mL) infusion  1.5 mg/kg/hr Intravenous To OR Grace Isaac, MD      . tranexamic acid (CYKLOKAPRON) bolus via infusion - over 30 minutes 1,336.5 mg  15 mg/kg Intravenous To OR Grace Isaac, MD      . tranexamic acid (CYKLOKAPRON) pump prime solution 178 mg  2 mg/kg Intracatheter To OR Grace Isaac, MD      . vancomycin (VANCOCIN) 1,500 mg in sodium chloride 0.9 % 250 mL IVPB  1,500 mg Intravenous To OR Grace Isaac, MD        Pertinent items are noted in HPI.   Review of Systems:     Cardiac Review of Systems: [Y] = yes  or   [ N ] = no   Chest Pain [  N]  Resting SOB [ N ] Exertional SOB  [ Y ]  Orthopnea [ N ]   Pedal Edema [  N ]    Palpitations Aqua.Slicker ] Syncope  Aqua.Slicker  ]   Presyncope [  N ]   General Review of Systems: [Y] = yes [  ]=no Constitional: recent weight change [  ];  Wt loss over the last 3 months [   ] anorexia [  ]; fatigue [  ]; nausea [  ];  night sweats [  ]; fever [  ]; or chills [  ];           Eye : blurred vision [  ]; diplopia [   ]; vision changes [  ];  Amaurosis fugax[  ]; Resp: cough [  ];  wheezing[  ];  hemoptysis[  ]; shortness of breath[  ]; paroxysmal nocturnal dyspnea[  ]; dyspnea on exertion[  ]; or orthopnea[  ];  GI:  gallstones[  ], vomiting[  ];  dysphagia[  ]; melena[  ];  hematochezia [  ]; heartburn[  ];   Hx of  Colonoscopy[  ]; GU: kidney stones [  ]; hematuria[  ];   dysuria Blue.Reese  ];  nocturia[y  ];  history of     obstruction [  ]; urinary frequency Blue.Reese  ]             Skin: rash, swelling[  ];, hair loss[  ];  peripheral edema[  ];  or itching[  ]; Musculosketetal: myalgias[  ];  joint swelling[  ];  joint erythema[  ];  joint pain[y  ];  back pain[  ];  Heme/Lymph: bruising[  ];  bleeding[  ];  anemia[  ];  Neuro: TIA[  ];  headaches[  ];  stroke[  ];  vertigo[  ];  seizures[  ];   paresthesias[  ];  difficulty walking[  ];  Psych:depression[  ];  anxiety[  ];  Endocrine: diabetes[ y ];  thyroid dysfunction[  ];  Immunizations: Flu up to date [ Y ]; Pneumococcal up to date [facility] ];  Other:    PHYSICAL EXAMINATION: BP 116/71   Pulse 61   Temp 97.7 F (36.5 C)   Resp 18   SpO2 97%  General appearance: alert and cooperative Head: Normocephalic, without obvious abnormality, atraumatic Neck: no adenopathy, no carotid bruit, no JVD, supple, symmetrical, trachea midline and thyroid not enlarged, symmetric, no tenderness/mass/nodules Lymph nodes: Cervical, supraclavicular, and axillary nodes normal. Resp: clear to auscultation bilaterally Back: symmetric, no curvature. ROM normal. No CVA tenderness. Cardio: regular rate and rhythm, S1, S2 normal, no murmur, click, rub or gallop GI: soft, non-tender; bowel sounds normal; no masses,  no organomegaly Extremities: extremities normal, atraumatic, no cyanosis or edema and varicose veins noted Neurologic: Grossly normal  Diagnostic Studies & Laboratory data:     Recent Radiology Findings:   Nm Pet Image Initial (pi) Skull Base To Thigh  Result Date: 01/01/2018 CLINICAL DATA:  Subsequent treatment strategy for right lung nodule. EXAM: NUCLEAR MEDICINE PET SKULL BASE TO THIGH TECHNIQUE: 10.0 mCi F-18 FDG was injected intravenously. Full-ring PET imaging was performed from the skull base to thigh after the radiotracer. CT data was obtained and used for attenuation correction and anatomic localization. Fasting blood glucose: 121 mg/dl COMPARISON:  08/26/2017 FINDINGS: (Reference/background mediastinal blood pool activity: SUV max = 2.5) NECK:  No hypermetabolic lymph nodes or masses. Incidental CT findings:  None. CHEST: No hypermetabolic masses or lymphadenopathy. A 1.5 cm ground-glass nodule is seen in the posterior right lung apex which remains stable in size since previous studies. No solid component visualized. This does show mild FDG uptake with SUV of 1.7, without significant change compared to  prior study. No new or enlarging pulmonary nodules identified. No evidence of pleural effusion. Incidental CT findings: Aortic and coronary artery atherosclerosis. Stable 4.1 cm ascending thoracic aortic aneurysm. ABDOMEN/PELVIS: No abnormal hypermetabolic activity within the liver, pancreas, adrenal glands, or spleen. No hypermetabolic  lymph nodes in the abdomen or pelvis. A subcapsular lesion is seen in the lateral midpole of the left kidney which measures 2.4 cm. This shows FDG uptake with SUV max 3.4, suspicious for low-grade renal cell carcinoma. A fluid attenuation cystic lesion in the pancreatic tail measures 2.8 x 2.7 cm, and is stable compared to prior CT in 2013. This shows no FDG uptake, and is most consistent with a benign etiology such as an indolent cystic pancreatic neoplasm or pseudocyst. Incidental CT findings: Tiny calcified gallstones noted, without evidence of cholecystitis. Mildly enlarged prostate. SKELETON: No focal hypermetabolic bone lesions to suggest skeletal metastasis. Incidental CT findings:  None. IMPRESSION: 2.4 cm subcapsular lesion in left kidney with FDG uptake, highly suspicious for renal cell carcinoma. Recommend abdomen MRI without and with contrast for further evaluation. These results will be called to the ordering clinician or representative by the Radiologist Assistant, and communication documented in the PACS or zVision Dashboard. Stable 1.5 cm ground-glass nodule in posterior right lung apex with low-grade FDG uptake. Low-grade adenocarcinoma cannot be excluded. Recommend continued follow-up by chest CT without contrast (rather than PET CT) in 12 months. This recommendation follows the consensus statement: Guidelines for Management of Small Pulmonary Nodules Detected on CT Images: From the Fleischner Society 2017; published online before print (10.1148/radiol.2831517616). Cystic lesion in pancreatic tail shows no FDG uptake and remains stable, consistent with an indolent  cystic pancreatic neoplasm or pseudocyst. This can also be assessed by MRI. Stable 4.1 cm ascending thoracic aortic aneurysm. This can also be followed up by chest CT in 12 months. This recommendation follows 2010 ACCF/AHA/AATS/ACR/ASA/SCA/SCAI/SIR/STS/SVM Guidelines for the Diagnosis and Management of Patients with Thoracic Aortic Disease. Circulation. 2010; 121: W737-T062. Electronically Signed   By: Earle Gell M.D.   On: 01/01/2018 12:01     I have independently reviewed the above radiology studies  and reviewed the findings with the patient. The patient has seen Urology concerning renal mass and is follwed by Urology   Recent Lab Findings: Lab Results  Component Value Date   WBC 7.2 03/28/2018   HGB 15.4 03/28/2018   HCT 46.3 03/28/2018   PLT 141 (L) 03/28/2018   GLUCOSE 160 (H) 03/28/2018   CHOL 190 04/01/2012   TRIG (H) 04/01/2012    525.0 Triglyceride is over 400; calculations on Lipids are invalid.   HDL 27.50 (L) 04/01/2012   LDLDIRECT 84.5 04/01/2012   LDLCALC 32 04/03/2010   ALT 34 03/28/2018   AST 26 03/28/2018   NA 140 03/28/2018   K 3.8 03/28/2018   CL 105 03/28/2018   CREATININE 1.57 (H) 03/28/2018   BUN 37 (H) 03/28/2018   CO2 18 (L) 03/28/2018   TSH 2.28 04/01/2012   INR 1.21 03/28/2018   HGBA1C 6.2 (H) 02/14/2018   Chronic Kidney Disease   Stage I     GFR >90  Stage II    GFR 60-89  Stage IIIA GFR 45-59  Stage IIIB GFR 30-44  Stage IV   GFR 15-29  Stage V    GFR  <15  Lab Results  Component Value Date   CREATININE 1.57 (H) 03/28/2018   Estimated Creatinine Clearance: 48.6 mL/min (A) (by C-G formula based on SCr of 1.57 mg/dL (H)).  Pulmonary function studies done today Conclusions: The results are within normal limits. Pulmonary Function Diagnosis: Normal Pulmonary Function  FEV1 2.9  89% predicted DLCO 28. 85% predicted  Cath: Left Anterior Descending  Collaterals  Mid LAD filled by collaterals from Post  AtrioColon [place] LAD to Prox LAD  lesion 100% stenosed  Ost LAD to Prox LAD lesion is 100% stenosed. The lesion is severely calcified.  Mid LAD to Dist LAD lesion 90% stenosed  Mid LAD to Dist LAD lesion is 90% stenosed.  Dist LAD lesion 90% stenosed  Dist LAD lesion is 90% stenosed.  Ramus Intermedius  Vessel is large.  Ost Ramus lesion 75% stenosed  Ost Ramus lesion is 75% stenosed.  Ramus-1 lesion 80% stenosed  Ramus-1 lesion is 80% stenosed.  Ramus-2 lesion 60% stenosed  Ramus-2 lesion is 60% stenosed.  Left Circumflex  Vessel is normal in caliber. Mild proximal disease  Right Coronary Artery  Prox RCA lesion 40% stenosed  Prox RCA lesion is 40% stenosed.  Mid RCA lesion 70% stenosed  Mid RCA lesion is 70% stenosed. The lesion is focal.  Right Posterior Descending Artery  Ost RPDA to RPDA lesion 100% stenosed  Ost RPDA to RPDA lesion is 100% stenosed.   AO Systolic Pressure 976 mmHg  AO Diastolic Pressure 69 mmHg  AO Mean 91 mmHg  LV Systolic Pressure 734 mmHg  LV Diastolic Pressure 2 mmHg  LV EDP 15 mmHg  AOp Systolic Pressure 193 mmHg  AOp Diastolic Pressure 75 mmHg  AOp Mean Pressure 790 mmHg  LVp Systolic Pressure 240 mmHg  LVp Diastolic Pressure 4 mmHg  LVp EDP Pressure 15 mmHg   Transthoracic Echocardiography  Patient:    Nathan Medina, Nathan Medina MR #:       [id number] Study Date: 03/24/2018 Gender:     M Age:        17 Height:     170.2 cm Weight:     88 kg BSA:        2.07 m^2 Pt. Status: Room:   ORDERING     Lanelle Bal MD  [place] MD  PERFORMING   [facility], Outpatient  SONOGRAPHER  Darlina Sicilian, RDCS  cc:  ------------------------------------------------------------------- LV EF: 40% -   45%  ------------------------------------------------------------------- Indications:      CAD of native vessels 414.01.  ------------------------------------------------------------------- History:   PMH:  Thyroid Cancer. Renal Mass. Chronic Kidney Disease.  Risk  factors:  Hypertension. Diabetes mellitus. Dyslipidemia.  ------------------------------------------------------------------- Study Conclusions  - Left ventricle: The cavity size was normal. Wall thickness was   normal. Systolic function was mildly to moderately reduced. The   estimated ejection fraction was in the range of 40% to 45%.   Dyskinesis and aneurysmal deformity of the apical myocardium;   consistent with infarction in the distribution of the left   anterior descending coronary artery; new since the study of   04/20/2009. Doppler parameters are consistent with abnormal left   ventricular relaxation (grade 1 diastolic dysfunction). Doppler   parameters are consistent with elevated mean left atrial filling   pressure. No evidence of thrombus.  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  ------------------------------------------------------------------- Study data:  No prior study was available for comparison.  Study status:  Routine.  Procedure:  The patient reported no pain pre or post test. Transthoracic echocardiography. Image quality was adequate. The study was technically difficult, as a result of poor acoustic windows.  Study completion:  There were no complications.         Transthoracic echocardiography.  M-mode, complete 2D, spectral Doppler, and color Doppler.  Birthdate:  Patient birthdate: [birth year].  Age:  Patient is  67 yr old.  Sex:  Gender: male.    BMI: 30.4 kg/m^2.  Blood pressure:     112/73  Patient status:  Outpatient.  Study date:  Study date: 03/24/2018. Study time: 10:10 AM.  Location:  Echo laboratory.  -------------------------------------------------------------------  ------------------------------------------------------------------- Left ventricle:  The cavity size was normal. Wall thickness was normal. Systolic function was mildly to moderately  reduced. The estimated ejection fraction was in the range of 40% to 45%.  No evidence of thrombus.  Regional wall motion abnormalities: Dyskinesis and aneurysmal deformity of the apical myocardium; consistent with infarction in the distribution of the left anterior descending coronary artery; new since the study of 04/20/2009. Doppler parameters are consistent with abnormal left ventricular relaxation (grade 1 diastolic dysfunction). Doppler parameters are consistent with elevated mean left atrial filling pressure.  ------------------------------------------------------------------- Aortic valve:   Structurally normal valve.   Cusp separation was normal.  Doppler:  Transvalvular velocity was within the normal range. There was no stenosis. There was no regurgitation.  ------------------------------------------------------------------- Aorta:  Aortic root: The aortic root was normal in size. Ascending aorta: The ascending aorta was normal in size.  ------------------------------------------------------------------- Mitral valve:   Structurally normal valve.   Leaflet separation was normal.  Doppler:  Transvalvular velocity was within the normal range. There was no evidence for stenosis. There was no regurgitation.    Valve area by pressure half-time: 2.53 cm^2. Indexed valve area by pressure half-time: 1.22 cm^2/m^2.  ------------------------------------------------------------------- Left atrium:  The atrium was normal in size.  ------------------------------------------------------------------- Right ventricle:  The cavity size was normal. Systolic function was normal.  ------------------------------------------------------------------- Pulmonic valve:   Poorly visualized.  Doppler:  There was no significant regurgitation.  ------------------------------------------------------------------- Tricuspid valve:  Poorly visualized.  Structurally normal valve. Leaflet  separation was normal.  Doppler:  Transvalvular velocity was within the normal range. There was trivial regurgitation.  ------------------------------------------------------------------- Pulmonary artery:   Systolic pressure was within the normal range.   ------------------------------------------------------------------- Right atrium:  The atrium was normal in size.  ------------------------------------------------------------------- Pericardium:  There was no pericardial effusion.  ------------------------------------------------------------------- Measurements   Left ventricle                         Value          Reference  LV ID, ED, PLAX chordal        (L)     41.34 mm       43 - 52  LV ID, ES, PLAX chordal                26.89 mm       23 - 38  LV fx shortening, PLAX chordal         35    %        >=29  LV PW thickness, ED                    9.18  mm       ----------  IVS/LV PW ratio, ED            (H)     1.64           <=1.3  Stroke volume, 2D                      75    ml       ----------  Stroke volume/bsa, 2D  36    ml/m^2   ----------  LV e&', lateral                         5.11  cm/s     ----------  LV E/e&', lateral                       12.74          ----------  LV e&', medial                          3.92  cm/s     ----------  LV E/e&', medial                        16.61          ----------  LV e&', average                         4.52  cm/s     ----------  LV E/e&', average                       14.42          ----------    Ventricular septum                     Value          Reference  IVS thickness, ED                      15.05 mm       ----------    LVOT                                   Value          Reference  LVOT ID, S                             20    mm       ----------  LVOT area                              3.14  cm^2     ----------  LVOT peak velocity, S                  93.3  cm/s     ----------  LVOT mean velocity, S                   60.3  cm/s     ----------  LVOT VTI, S                            24    cm       ----------    Aorta                                  Value          Reference  Aortic root ID, ED  29.87 mm       ----------    Left atrium                            Value          Reference  LA volume, S                           55.4  ml       ----------  LA volume/bsa, S                       26.8  ml/m^2   ----------  LA volume, ES, 1-p A4C                 50.3  ml       ----------  LA volume/bsa, ES, 1-p A4C             24.3  ml/m^2   ----------  LA volume, ES, 1-p A2C                 51.1  ml       ----------  LA volume/bsa, ES, 1-p A2C             24.7  ml/m^2   ----------    Mitral valve                           Value          Reference  Mitral E-wave peak velocity            65.1  cm/s     ----------  Mitral A-wave peak velocity            93.8  cm/s     ----------  Mitral deceleration time       (H)     296   ms       150 - 230  Mitral pressure half-time              87    ms       ----------  Mitral E/A ratio, peak                 0.7            ----------  Mitral valve area, PHT, DP             2.53  cm^2     ----------  Mitral valve area/bsa, PHT, DP         1.22  cm^2/m^2 ----------    Pulmonary arteries                     Value          Reference  PA pressure, S, DP                     26    mm Hg    <=30    Tricuspid valve                        Value          Reference  Tricuspid regurg peak velocity         239   cm/s     ----------  Tricuspid peak RV-RA gradient  23    mm Hg    ----------    Right atrium                           Value          Reference  RA ID, S-I, ES, A4C            (H)     60.1  mm       34 - 49  RA area, ES, A4C                       15.7  cm^2     8.3 - 19.5  RA volume, ES, A/L                     33.7  ml       ----------  RA volume/bsa, ES, A/L                 16.3  ml/m^2   ----------    Systemic veins                          Value          Reference  Estimated CVP                          3     mm Hg    ----------    Right ventricle                        Value          Reference  TAPSE                                  28.2  mm       ----------  RV pressure, S, DP                     26    mm Hg    <=30  RV s&', lateral, S                      12.2  cm/s     ----------  Legend: (L)  and  (H)  mark values outside specified reference range.  ------------------------------------------------------------------- Prepared and Electronically Authenticated by  Sanda Klein, MD 2019-11-04T11:43:55 LOWER EXTREMITY VEIN MAPPING  Indications: Post-op Comparison Study: No comparison study available  Performing Technologist: Lorina Rabon   Examination Guidelines: A complete evaluation includes B-mode imaging, spectral Doppler, color Doppler, and power Doppler as needed of all accessible portions of each vessel. Bilateral testing is considered an integral part of a complete examination. Limited examinations for reoccurring indications may be performed as noted.  +---------------+-----------+----------------------+---------------+-----------+  RT Diameter RT Findings     GSV      LT Diameter LT Findings    (cm)                       (cm)          +---------------+-----------+----------------------+---------------+-----------+    0.66           Saphenofemoral     0.50  Junction                   +---------------+-----------+----------------------+---------------+-----------+    0.34    branching   Proximal thigh     0.40    branching  +---------------+-----------+----------------------+---------------+-----------+    0.26    branching    Mid thigh       0.44    branching   +---------------+-----------+----------------------+---------------+-----------+    0.31    branching    Distal thigh      0.34    branching  +---------------+-----------+----------------------+---------------+-----------+    0.39    branching      Knee        0.38    branching  +---------------+-----------+----------------------+---------------+-----------+    0.41    branching    Prox calf       0.35    branching  +---------------+-----------+----------------------+---------------+-----------+    0.30    branching     Mid calf       0.32    branching  +---------------+-----------+----------------------+---------------+-----------+    0.32    branching    Distal calf      0.33    branching  +---------------+-----------+----------------------+---------------+-----------+   Diagnosing physician: Monica Martinez MD Electronically signed by Monica Martinez MD on 03/28/2018 at 1:58:42 PM.   Assessment / Plan:   1/Stable 1.5 cm ground-glass nodule in posterior right lung apex with low-grade FDG uptake. Low-grade adenocarcinoma cannot be excluded. 2/2.4 cm subcapsular lesion in left kidney with FDG uptake, highly suspicious for renal cell carcinoma.-Patient has seen urology and this lesion will continue to be followed 3/Stable 4.1 cm ascending thoracic aortic aneurysm. 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  I discussed with the patient proceeding with coronary artery bypass grafting.  I have explained to him the process involved risks and options.  With history of decreased LV function and severe three-vessel coronary artery  disease in a diabetic coronary artery bypass grafting would be his best option.   6/ stage III chronic kidney disease   I  discussed proceeding with coronary artery bypass grafting, possible wedge resection of the right upper lobe lung mass if easily approachable.  The patient has a high syntax score severe three-vessel coronary artery disease high risk nuclear study and depressed LV function.  The goals risks and alternatives of the planned surgical procedure Procedure(s): CORONARY ARTERY BYPASS GRAFTING (CABG) (N/A) TRANSESOPHAGEAL ECHOCARDIOGRAM (TEE) (N/A) possible right upper lobe WEDGE RESECTION (Right)  have been discussed with the patient in detail. The risks of the procedure including death, infection, stroke, myocardial infarction, bleeding, blood transfusion have all been discussed specifically.  I have quoted [place] Neighbor a 4 % of perioperative mortality and a complication rate as high as 40 %. The patient's questions have been answered.Cranston Neighbor is willing  to proceed with the planned procedure.     Grace Isaac MD      [address] [place] [address] Office [phone]   Beeper [phone]  04/01/2018 7:07 AM

## 2018-04-01 NOTE — Brief Op Note (Signed)
04/01/2018  9:38 AM  PATIENT:  Nathan Medina  67 y.o. male  PRE-OPERATIVE DIAGNOSIS:  CAD  POST-OPERATIVE DIAGNOSIS:  CAD  PROCEDURE:  Procedure(s):  CORONARY ARTERY BYPASS GRAFTING x 3 -LIMA to LAD -SVG to RAMUS INTERMEDIATE -SVG to PDA  ENDOSCOPIC HARVEST GREATER SAPHENOUS VEIN -Right Leg  TRANSESOPHAGEAL ECHOCARDIOGRAM (TEE) (N/A)  SURGEON:  Surgeon(s) and Role:    * Grace Isaac, MD - Primary  PHYSICIAN ASSISTANT: Ellwood Handler PA-C  ANESTHESIA:   general  EBL:  1100 mL   BLOOD ADMINISTERED:CELLSAVER  DRAINS: Left Pleural Chest Tube, Right Pleural Chest Tube, Mediastinal Chest Drains   LOCAL MEDICATIONS USED:  NONE  SPECIMEN:  No Specimen  DISPOSITION OF SPECIMEN:  N/A  COUNTS:  YES  TOURNIQUET:  * No tourniquets in log *  DICTATION: .Dragon Dictation  PLAN OF CARE: Admit to inpatient   PATIENT DISPOSITION:  ICU - intubated and hemodynamically stable.   Delay start of Pharmacological VTE agent (>24hrs) due to surgical blood loss or risk of bleeding: yes

## 2018-04-01 NOTE — Anesthesia Preprocedure Evaluation (Addendum)
Anesthesia Evaluation  Patient identified by MRN, date of birth, ID band Patient awake    Reviewed: Allergy & Precautions, NPO status , Patient's Chart, lab work & pertinent test results, reviewed documented beta blocker date and time   Airway Mallampati: II  TM Distance: <3 FB     Dental  (+) Teeth Intact, Dental Advisory Given   Pulmonary    breath sounds clear to auscultation       Cardiovascular hypertension, Pt. on home beta blockers + CAD   Rhythm:Regular Rate:Bradycardia     Neuro/Psych Anxiety Depression    GI/Hepatic GERD  Medicated,  Endo/Other  diabetes, Type 2, Insulin Dependent  Renal/GU CRFRenal disease     Musculoskeletal  (+) Arthritis ,   Abdominal Normal abdominal exam  (+)   Peds  Hematology   Anesthesia Other Findings - HLD  Reproductive/Obstetrics                           Lab Results  Component Value Date   WBC 7.2 03/28/2018   HGB 15.4 03/28/2018   HCT 46.3 03/28/2018   MCV 93.2 03/28/2018   PLT 141 (L) 03/28/2018   Lab Results  Component Value Date   CREATININE 1.57 (H) 03/28/2018   BUN 37 (H) 03/28/2018   NA 140 03/28/2018   K 3.8 03/28/2018   CL 105 03/28/2018   CO2 18 (L) 03/28/2018   Lab Results  Component Value Date   INR 1.21 03/28/2018   EKG: sinus bradycardia.  Echo: - Left ventricle: The cavity size was normal. Wall thickness was   normal. Systolic function was mildly to moderately reduced. The   estimated ejection fraction was in the range of 40% to 45%.   Dyskinesis and aneurysmal deformity of the apical myocardium;   consistent with infarction in the distribution of the left   anterior descending coronary artery; new since the study of   04/20/2009. Doppler parameters are consistent with abnormal left   ventricular relaxation (grade 1 diastolic dysfunction). Doppler   parameters are consistent with elevated mean left atrial filling  pressure. No evidence of thrombus.    Anesthesia Physical Anesthesia Plan  ASA: IV  Anesthesia Plan: General   Post-op Pain Management:    Induction: Intravenous  PONV Risk Score and Plan: Ondansetron  Airway Management Planned: Oral ETT  Additional Equipment: Arterial line, CVP, PA Cath, TEE and Ultrasound Guidance Line Placement  Intra-op Plan:   Post-operative Plan: Post-operative intubation/ventilation  Informed Consent: I have reviewed the patients History and Physical, chart, labs and discussed the procedure including the risks, benefits and alternatives for the proposed anesthesia with the patient or authorized representative who has indicated his/her understanding and acceptance.   Dental advisory given  Plan Discussed with: CRNA  Anesthesia Plan Comments:        Anesthesia Quick Evaluation

## 2018-04-01 NOTE — Anesthesia Procedure Notes (Signed)
Central Venous Catheter Insertion Performed by: Effie Berkshire, MD, anesthesiologist Start/End11/04/2018 7:20 AM, 04/01/2018 7:25 AM Patient location: Pre-op. Preanesthetic checklist: patient identified, IV checked, site marked, risks and benefits discussed, surgical consent, monitors and equipment checked, pre-op evaluation, timeout performed and anesthesia consent Hand hygiene performed  and maximum sterile barriers used  PA cath was placed.Swan type:thermodilution Procedure performed without using ultrasound guided technique. Attempts: 1 Patient tolerated the procedure well with no immediate complications.

## 2018-04-01 NOTE — Anesthesia Procedure Notes (Signed)
Procedure Name: Intubation Date/Time: 04/01/2018 7:53 AM Performed by: Kyung Rudd, CRNA Pre-anesthesia Checklist: Patient identified, Emergency Drugs available, Suction available, Patient being monitored and Timeout performed Patient Re-evaluated:Patient Re-evaluated prior to induction Oxygen Delivery Method: Circle system utilized Preoxygenation: Pre-oxygenation with 100% oxygen Induction Type: IV induction Ventilation: Mask ventilation without difficulty and Oral airway inserted - appropriate to patient size Laryngoscope Size: Mac and 3 Grade View: Grade I Tube type: Oral Tube size: 8.0 mm Number of attempts: 1 Airway Equipment and Method: Stylet Placement Confirmation: ETT inserted through vocal cords under direct vision,  positive ETCO2 and breath sounds checked- equal and bilateral Secured at: 21 cm Tube secured with: Tape Dental Injury: Teeth and Oropharynx as per pre-operative assessment

## 2018-04-01 NOTE — Anesthesia Procedure Notes (Signed)
Arterial Line Insertion Start/End11/04/2018 9:44 AM, 04/01/2018 9:44 AM Performed by: Kyung Rudd, CRNA, CRNA  Preanesthetic checklist: patient identified, IV checked, site marked, risks and benefits discussed, surgical consent, monitors and equipment checked, pre-op evaluation and timeout performed Lidocaine 1% used for infiltration Left, radial was placed Catheter size: 20 G Hand hygiene performed , maximum sterile barriers used  and Seldinger technique used Allen's test indicative of satisfactory collateral circulation Attempts: 1 Procedure performed without using ultrasound guided technique. Following insertion, Biopatch and dressing applied. Post procedure assessment: normal  Patient tolerated the procedure well with no immediate complications.

## 2018-04-01 NOTE — Progress Notes (Signed)
Post extubation ABG results shown to rounding MD.  1 amp bicarb ordered and given. Will recheck ABG in an hour.

## 2018-04-01 NOTE — Transfer of Care (Signed)
Immediate Anesthesia Transfer of Care Note  Patient: Nathan Medina  Procedure(s) Performed: CORONARY ARTERY BYPASS GRAFTING (CABG) x Three, using left internal mammary artery and right leg greater saphenous vein harvested endoscopically (N/A Chest) TRANSESOPHAGEAL ECHOCARDIOGRAM (TEE) (N/A )  Patient Location: SICU  Anesthesia Type:General  Level of Consciousness: sedated and Patient remains intubated per anesthesia plan  Airway & Oxygen Therapy: Patient remains intubated per anesthesia plan and Patient placed on Ventilator (see vital sign flow sheet for setting)  Post-op Assessment: Report given to RN and Post -op Vital signs reviewed and stable  Post vital signs: Reviewed and stable  Last Vitals:  Vitals Value Taken Time  BP    Temp 35.6 C 04/01/2018  1:32 PM  Pulse 76 04/01/2018  1:32 PM  Resp 14 04/01/2018  1:32 PM  SpO2 97 % 04/01/2018  1:32 PM  Vitals shown include unvalidated device data.  Last Pain:  Vitals:   04/01/18 0608  PainSc: 2       Patients Stated Pain Goal: 3 (69/50/72 2575)  Complications: No apparent anesthesia complications

## 2018-04-02 ENCOUNTER — Encounter (HOSPITAL_COMMUNITY): Payer: Self-pay | Admitting: Cardiothoracic Surgery

## 2018-04-02 ENCOUNTER — Inpatient Hospital Stay (HOSPITAL_COMMUNITY): Payer: Medicare Other

## 2018-04-02 ENCOUNTER — Other Ambulatory Visit: Payer: Self-pay

## 2018-04-02 LAB — BASIC METABOLIC PANEL
Anion gap: 5 (ref 5–15)
BUN: 22 mg/dL (ref 8–23)
CO2: 23 mmol/L (ref 22–32)
Calcium: 8.8 mg/dL — ABNORMAL LOW (ref 8.9–10.3)
Chloride: 113 mmol/L — ABNORMAL HIGH (ref 98–111)
Creatinine, Ser: 1.36 mg/dL — ABNORMAL HIGH (ref 0.61–1.24)
GFR calc Af Amer: >60 mL/min (ref >60)
GFR calc non Af Amer: 52 mL/min — ABNORMAL LOW (ref >60)
Glucose, Bld: 102 mg/dL — ABNORMAL HIGH (ref 70–99)
Potassium: 3.8 mmol/L (ref 3.5–5.1)
Sodium: 141 mmol/L (ref 135–145)

## 2018-04-02 LAB — CBC
HCT: 36.4 % — ABNORMAL LOW (ref 39.0–52.0)
HCT: 37.5 % — ABNORMAL LOW (ref 39.0–52.0)
Hemoglobin: 11.8 g/dL — ABNORMAL LOW (ref 13.0–17.0)
Hemoglobin: 12 g/dL — ABNORMAL LOW (ref 13.0–17.0)
MCH: 30.9 pg (ref 26.0–34.0)
MCH: 30.9 pg (ref 26.0–34.0)
MCHC: 32.4 g/dL (ref 30.0–36.0)
MCHC: 32 g/dL (ref 30.0–36.0)
MCV: 95.3 fL (ref 80.0–100.0)
MCV: 96.6 fL (ref 80.0–100.0)
Platelets: 87 10*3/uL — ABNORMAL LOW (ref 150–400)
Platelets: 79 10*3/uL — ABNORMAL LOW (ref 150–400)
RBC: 3.82 MIL/uL — ABNORMAL LOW (ref 4.22–5.81)
RBC: 3.88 MIL/uL — ABNORMAL LOW (ref 4.22–5.81)
RDW: 13.6 % (ref 11.5–15.5)
RDW: 13.9 % (ref 11.5–15.5)
WBC: 11.2 10*3/uL — ABNORMAL HIGH (ref 4.0–10.5)
WBC: 10.6 10*3/uL — ABNORMAL HIGH (ref 4.0–10.5)
nRBC: 0 % (ref 0.0–0.2)
nRBC: 0 % (ref 0.0–0.2)

## 2018-04-02 LAB — GLUCOSE, CAPILLARY
Glucose-Capillary: 101 mg/dL — ABNORMAL HIGH (ref 70–99)
Glucose-Capillary: 101 mg/dL — ABNORMAL HIGH (ref 70–99)
Glucose-Capillary: 102 mg/dL — ABNORMAL HIGH (ref 70–99)
Glucose-Capillary: 103 mg/dL — ABNORMAL HIGH (ref 70–99)
Glucose-Capillary: 107 mg/dL — ABNORMAL HIGH (ref 70–99)
Glucose-Capillary: 113 mg/dL — ABNORMAL HIGH (ref 70–99)
Glucose-Capillary: 115 mg/dL — ABNORMAL HIGH (ref 70–99)
Glucose-Capillary: 117 mg/dL — ABNORMAL HIGH (ref 70–99)
Glucose-Capillary: 120 mg/dL — ABNORMAL HIGH (ref 70–99)
Glucose-Capillary: 127 mg/dL — ABNORMAL HIGH (ref 70–99)
Glucose-Capillary: 169 mg/dL — ABNORMAL HIGH (ref 70–99)
Glucose-Capillary: 187 mg/dL — ABNORMAL HIGH (ref 70–99)
Glucose-Capillary: 93 mg/dL (ref 70–99)
Glucose-Capillary: 99 mg/dL (ref 70–99)

## 2018-04-02 LAB — CREATININE, SERUM
Creatinine, Ser: 1.68 mg/dL — ABNORMAL HIGH (ref 0.61–1.24)
GFR calc Af Amer: 47 mL/min — ABNORMAL LOW (ref >60)
GFR calc non Af Amer: 40 mL/min — ABNORMAL LOW (ref >60)

## 2018-04-02 LAB — POCT I-STAT, CHEM 8
BUN: 28 mg/dL — ABNORMAL HIGH (ref 8–23)
Calcium, Ion: 1.27 mmol/L (ref 1.15–1.40)
Chloride: 104 mmol/L (ref 98–111)
Creatinine, Ser: 1.6 mg/dL — ABNORMAL HIGH (ref 0.61–1.24)
Glucose, Bld: 208 mg/dL — ABNORMAL HIGH (ref 70–99)
HCT: 35 % — ABNORMAL LOW (ref 39.0–52.0)
Hemoglobin: 11.9 g/dL — ABNORMAL LOW (ref 13.0–17.0)
Potassium: 4.2 mmol/L (ref 3.5–5.1)
Sodium: 140 mmol/L (ref 135–145)
TCO2: 26 mmol/L (ref 22–32)

## 2018-04-02 LAB — MAGNESIUM
Magnesium: 2 mg/dL (ref 1.7–2.4)
Magnesium: 2 mg/dL (ref 1.7–2.4)

## 2018-04-02 MED ORDER — INSULIN DETEMIR 100 UNIT/ML ~~LOC~~ SOLN
15.0000 [IU] | Freq: Every day | SUBCUTANEOUS | Status: DC
  Administered 2018-04-03 – 2018-04-09 (×7): 15 [IU] via SUBCUTANEOUS
  Filled 2018-04-02 (×7): qty 0.15

## 2018-04-02 MED ORDER — INSULIN ASPART 100 UNIT/ML ~~LOC~~ SOLN
0.0000 [IU] | SUBCUTANEOUS | Status: DC
  Administered 2018-04-02 (×2): 4 [IU] via SUBCUTANEOUS
  Administered 2018-04-03 – 2018-04-04 (×11): 2 [IU] via SUBCUTANEOUS
  Administered 2018-04-05: 4 [IU] via SUBCUTANEOUS
  Administered 2018-04-05 – 2018-04-06 (×5): 2 [IU] via SUBCUTANEOUS
  Administered 2018-04-06 (×2): 4 [IU] via SUBCUTANEOUS
  Administered 2018-04-07 (×3): 2 [IU] via SUBCUTANEOUS

## 2018-04-02 MED ORDER — FUROSEMIDE 10 MG/ML IJ SOLN
40.0000 mg | Freq: Once | INTRAMUSCULAR | Status: AC
  Administered 2018-04-02: 40 mg via INTRAVENOUS
  Filled 2018-04-02: qty 4

## 2018-04-02 MED ORDER — INSULIN DETEMIR 100 UNIT/ML ~~LOC~~ SOLN
15.0000 [IU] | Freq: Once | SUBCUTANEOUS | Status: AC
  Administered 2018-04-02: 15 [IU] via SUBCUTANEOUS
  Filled 2018-04-02: qty 0.15

## 2018-04-02 NOTE — Progress Notes (Signed)
      [address]       [place] [address]             [phone]      POD # 1 CABG x 3 Resting comfortably BP 116/66   Pulse 79   Temp (!) 97.5 F (36.4 C) (Oral)   Resp 18   Wt 93.5 kg   SpO2 100%   BMI 32.28 kg/m   Intake/Output Summary (Last 24 hours) at 04/02/2018 1743 Last data filed at 04/02/2018 1100 Gross per 24 hour  Intake 1504.64 ml  Output 1500 ml  Net 4.64 ml   CBG well controlled, labs OK  Ammanda Dobbins C. Roxan Hockey, MD [facility] [phone]

## 2018-04-02 NOTE — Progress Notes (Addendum)
Patient ID: Nathan Medina, male   DOB: November 07, 1950, 67 y.o.   MRN: [id number] [facility] DAILY ICU PROGRESS NOTE                   [address]            [place] [address]          [phone]   1 Day Post-Op Procedure(s) (LRB): CORONARY ARTERY BYPASS GRAFTING (CABG) x Three, using left internal mammary artery and right leg greater saphenous vein harvested endoscopically (N/A) TRANSESOPHAGEAL ECHOCARDIOGRAM (TEE) (N/A)  Total Length of Stay:  LOS: 1 day   Subjective: Patient awake neurologically intact this morning extubated last night  Objective: Vital signs in last 24 hours: Temp:  [95.9 F (35.5 C)-100.6 F (38.1 C)] 99.7 F (37.6 C) (11/13 0442) Pulse Rate:  [64-91] 91 (11/13 0600) Cardiac Rhythm: Atrial paced (11/12 2000) Resp:  [12-22] 22 (11/13 0600) BP: (91-140)/(55-81) 94/60 (11/13 0400) SpO2:  [90 %-100 %] 94 % (11/13 0600) Arterial Line BP: (98-159)/(49-67) 128/49 (11/13 0600) FiO2 (%):  [40 %-50 %] 40 % (11/12 1712) Weight:  [93.5 kg] 93.5 kg (11/13 0600)  Filed Weights   04/02/18 0600  Weight: 93.5 kg    Weight change:    Hemodynamic parameters for last 24 hours: PAP: (19-36)/(6-18) 36/15 CO:  [3.2 L/min-8 L/min] 7.7 L/min CI:  [3 L/min/m2-6.4 L/min/m2] 3.8 L/min/m2  Intake/Output from previous day: 11/12 0701 - 11/13 0700 In: 5577.5 [I.V.:3219.9; Blood:900; IV Piggyback:1457.7] Out: 3720 [Urine:2120; Blood:1100; Chest Tube:500]  Intake/Output this shift: No intake/output data recorded.  Current Meds: Scheduled Meds: . acetaminophen  1,000 mg Oral Q6H   Or  . acetaminophen (TYLENOL) oral liquid 160 mg/5 mL  1,000 mg Per Tube Q6H  . allopurinol  300 mg Oral Daily  . aspirin EC  325 mg Oral Daily   Or  . aspirin  324 mg Per Tube Daily  . atorvastatin  10 mg Oral QHS  . bisacodyl  10 mg Oral Daily   Or  . bisacodyl  10 mg Rectal Daily  . docusate sodium  200 mg Oral Daily  . insulin regular  0-10 Units Intravenous TID WC  .  levothyroxine  175 mcg Oral QAC breakfast  . metoprolol tartrate  12.5 mg Oral BID   Or  . metoprolol tartrate  12.5 mg Per Tube BID  . omega-3 acid ethyl esters  2 g Oral BID  . [START ON 04/03/2018] pantoprazole  40 mg Oral Daily  . sodium chloride flush  3 mL Intravenous Q12H   Continuous Infusions: . sodium chloride 20 mL/hr at 04/02/18 0600  . sodium chloride    . sodium chloride    . albumin human 12.5 g (04/01/18 1940)  . cefUROXime (ZINACEF)  IV Stopped (04/02/18 0419)  . dexmedetomidine (PRECEDEX) IV infusion Stopped (04/01/18 1631)  . DOPamine 3 mcg/kg/min (04/02/18 0600)  . famotidine (PEPCID) IV Stopped (04/01/18 1354)  . insulin 1.2 mL/hr at 04/02/18 0600  . lactated ringers    . lactated ringers    . lactated ringers 20 mL/hr at 04/02/18 0600  . milrinone 0.15 mcg/kg/min (04/02/18 0600)  . nitroGLYCERIN Stopped (04/01/18 1316)  . phenylephrine (NEO-SYNEPHRINE) Adult infusion 5 mcg/min (04/02/18 0600)   PRN Meds:.sodium chloride, albumin human, ALPRAZolam, lactated ringers, metoprolol tartrate, midazolam, morphine injection, ondansetron (ZOFRAN) IV, oxyCODONE, sodium chloride flush, traMADol  General appearance: alert, cooperative and no distress Neurologic: intact Heart: regular rate and rhythm, S1, S2 normal,  no murmur, click, rub or gallop Lungs: diminished breath sounds bibasilar Abdomen: soft, non-tender; bowel sounds normal; no masses,  no organomegaly Extremities: extremities normal, atraumatic, no cyanosis or edema and Homans sign is negative, no sign of DVT Wound: Sternal dressings intact  Lab Results: CBC: Recent Labs    04/01/18 1853 04/01/18 1859 04/02/18 0350  WBC 12.4*  --  11.2*  HGB 12.7* 12.6* 11.8*  HCT 38.3* 37.0* 36.4*  PLT 104*  --  87*   BMET:  Recent Labs    04/01/18 1859 04/02/18 0350  NA 143 141  K 3.7 3.8  CL 109 113*  CO2  --  23  GLUCOSE 126* 102*  BUN 24* 22  CREATININE 1.10 1.36*  CALCIUM  --  8.8*    CMET: Lab  Results  Component Value Date   WBC 11.2 (H) 04/02/2018   HGB 11.8 (L) 04/02/2018   HCT 36.4 (L) 04/02/2018   PLT 87 (L) 04/02/2018   GLUCOSE 102 (H) 04/02/2018   CHOL 190 04/01/2012   TRIG (H) 04/01/2012    525.0 Triglyceride is over 400; calculations on Lipids are invalid.   HDL 27.50 (L) 04/01/2012   LDLDIRECT 84.5 04/01/2012   LDLCALC 32 04/03/2010   ALT 34 03/28/2018   AST 26 03/28/2018   NA 141 04/02/2018   K 3.8 04/02/2018   CL 113 (H) 04/02/2018   CREATININE 1.36 (H) 04/02/2018   BUN 22 04/02/2018   CO2 23 04/02/2018   TSH 2.28 04/01/2012   PSA 3.39 04/01/2012   INR 1.55 04/01/2018   HGBA1C 6.2 (H) 02/14/2018   MICROALBUR 12.2 (H) 04/01/2012      PT/INR:  Recent Labs    04/01/18 1327  LABPROT 18.4*  INR 1.55   Radiology: Dg Chest Port 1 View  Result Date: 04/01/2018 CLINICAL DATA:  67 year old male postoperative day zero status post CABG. EXAM: PORTABLE CHEST 1 VIEW COMPARISON:  03/28/2018 and earlier. FINDINGS: Portable AP semi upright view at 1329 hours. Intubated. ET tube at the level the clavicles. Enteric tube courses to the abdomen, tip not included. Left chest tube and mediastinal tube in place. Right IJ approach Swan-Ganz catheter, tip at the distal main versus proximal right pulmonary artery. No pneumothorax. Lower lung volumes. Veiling bibasilar opacity and dense retrocardiac opacity. Changes of CABG. Stable mediastinal contours. Paucity bowel gas in the upper abdomen. IMPRESSION: 1. Lines and tubes appear appropriately placed as above. 2. No pneumothorax. Low lung volumes with bilateral pleural effusions and lower lobe collapse or consolidation. Electronically Signed   By: Genevie Ann M.D.   On: 04/01/2018 13:43     Assessment/Plan: S/P Procedure(s) (LRB): CORONARY ARTERY BYPASS GRAFTING (CABG) x Three, using left internal mammary artery and right leg greater saphenous vein harvested endoscopically (N/A) TRANSESOPHAGEAL ECHOCARDIOGRAM (TEE)  (N/A) Mobilize Diuresis Diabetes control d/c tubes/lines See progression orders Expected Acute  Blood - loss Anemia- continue to monitor  Renal function below baseline, continue to monitor creatinine and urine output as patient has known chronic renal insufficiency as documented on his H&P Remains on low-dose milrinone and dopamine with decreased LV function preop, cardiac index greater than 3   Grace Isaac 04/02/2018 7:31 AM

## 2018-04-02 NOTE — Anesthesia Postprocedure Evaluation (Signed)
Anesthesia Post Note  Patient: JAMEIRE KOUBA  Procedure(s) Performed: CORONARY ARTERY BYPASS GRAFTING (CABG) x Three, using left internal mammary artery and right leg greater saphenous vein harvested endoscopically (N/A Chest) TRANSESOPHAGEAL ECHOCARDIOGRAM (TEE) (N/A )     Patient location during evaluation: SICU Anesthesia Type: General Level of consciousness: oriented Pain management: pain level controlled Vital Signs Assessment: post-procedure vital signs reviewed and stable Respiratory status: patient remains intubated per anesthesia plan Cardiovascular status: stable Postop Assessment: no apparent nausea or vomiting Anesthetic complications: no Comments: Resting comfortably, DA and milrinone gtt infusing, BP stable. Pain controlled.      Last Vitals:  Vitals:   04/02/18 1700 04/02/18 1800  BP: 134/66 132/77  Pulse: 80 76  Resp: 18 18  Temp:    SpO2: 94% 94%    Last Pain:  Vitals:   04/02/18 1708  TempSrc:   PainSc: 3                  Effie Berkshire

## 2018-04-02 NOTE — Op Note (Signed)
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  POSTOPERATIVE DIAGNOSIS:  Coronary occlusive disease with decreased left ventricular function and high risk stress test.  SURGICAL PROCEDURE:  Coronary artery bypass grafting x3 with the left internal mammary to the left anterior descending coronary artery, reverse saphenous vein graft to the distal right coronary artery, reverse saphenous vein graft to the ramus  intermedius with right thigh greater saphenous endoscopic vein harvesting and cardiopulmonary bypass.  SURGEON:  Lanelle Bal, MD  FIRST ASSISTANT:  Ellwood Handler, PA-C  BRIEF HISTORY:  The patient is a 67 year old male who had initially been seen in the office in followup for a small ground glass opacity in his right upper lobe.  During this evaluation, the patient was also noted to have a renal mass.  MRI of the  abdomen was performed and the patient was seen and followed by urology.  He had known coronary occlusive disease and underwent cardiac workup by Dr. Virgina Jock including a high risk stress test and cardiac catheterization.  At the time of catheterization, he  was noted to have significant lesions in the right coronary artery, which was providing collateral flow to the totally occluded left anterior descending coronary artery.  The circumflex proper was without significant disease.  A large ramus branch as  primary supplier of the lateral wall had 80% stenosis.  LV function was depressed compared to old echocardiograms with apical hypokinesis, likely related to apical infarct with the occlusion of his LAD.[name]  Because of his diabetes, 3-vessel coronary artery  disease, high risk stress test  and depressed ejection fraction of approximately 40, coronary artery bypass grafting was recommended to the patient who agreed and signed informed consent.  Initially after recommending this, the patient wished to wait  until November before he was willing to proceed.  We discussed the possibility of wedge resection of the lung mass; however, this was small and not solid and in the posterior segment of the right upper lobe.  At the time of surgery, we did try to palpate  this, but we were unable to locate it easily and did not make any attempt to biopsy the lung.  DESCRIPTION OF PROCEDURE:  With Swan-Ganz and arterial line monitors in place, the patient underwent general endotracheal anesthesia without incident.  Skin the chest and legs was prepped with Betadine, draped in the usual sterile manner.  TEE probe was  placed.  Ejection fraction was approximately 40% with anterior, apical hypokinesis without significant valvular disease.  Skin of the chest and legs was prepped with Betadine and draped in the usual sterile manner.  Appropriate timeout was performed and  we proceeded with right greater saphenous endoscopic vein harvesting from the right 32 The vein was of good quality and caliber.  Median sternotomy was performed.  Left internal mammary artery was dissected down as a pedicle graft.  The distal artery was  divided and had good free flow.  Pericardium was opened.  Overall, ventricular function appeared preserved with the exception of an area of hypokinesis anterior apically.  The patient was systemically heparinized.  The ascending aorta was cannulated.   The right atrium was cannulated.  An aortic root vent cardioplegia needle was introduced into the ascending aorta.  The patient was placed on cardiopulmonary  bypass, 2.4 liters per minute per meter squared.  Sites and anastomoses were selected and  dissected at the epicardium.  The patient's body temperature was cooled to 32 degrees.  Aortic  crossclamp was applied and 500 mL of cold blood potassium cardioplegia was administered with diastolic arrest of the heart.  Myocardial septal temperature was  monitored throughout the crossclamp.  Attention was turned first to the distal right coronary artery.  This vessel was opened and admitted a 1.5 mm probe easily  proximally and distally.  Using a running 7-0 Prolene, distal anastomosis was performed.   The heart was then elevated and the ramus intermedius was located.  This vessel was a moderately large vessel, more than 1.5 mm in size and intramyocardial.  The vessel was dissected out.  The muscle was opened and a distal anastomosis with a segment of  reverse saphenous vein graft was done.  Additional cold blood cardioplegia was administered down the vein grafts.  Attention was then turned to the left anterior descending coronary artery, which was intramyocardial to the distal third of the vessel.   The vessel was opened and admitted a 1.5 mm probe proximally and distally.  Using a running 8-0 Prolene, the left internal mammary artery was anastomosed to left anterior descending coronary artery.  With cross clamp still in place, 2 punch aortotomies  were performed and each of the 2 vein grafts were anastomosed to the ascending aorta.  The bulldog was removed from the mammary artery with rise in myocardial septal temperature.  The heart was allowed to passively fill and deair and the proximal  anastomoses were completed.  The patient required electrical defibrillation, returned to a sinus rhythm.  Sites of anastomosis were inspected and free of bleeding.  Ventricular and atrial wires were placed.  With the body's temperature rewarmed to 37  degrees, the patient was then ventilated and weaned from cardiopulmonary bypass on low dose dopamine and milrinone.  His preoperative creatinine was mildly elevated from baseline 1.5.  Urine output was good during the procedure.  He did not require any  blood bank  blood products during the operative procedure.  Total pump time was 110 minutes.  With the patient hemodynamically stable, he was decannulated in the usual fashion.  Protamine sulfate was administered.  Graft markers were placed.  Pericardium  was loosely reapproximated.  Sternum was closed with #6 stainless steel wire.  Fascia closed with interrupted 0 Vicryl, running 3-0 Vicryl, subcutaneous tissue and 3-0 subcuticular stitch in skin edges.  Dry dressings were applied.  Sponge and needle  count was reported as correct at completion of the procedure.  RF detection system scan was clear.  As noted, we did not make any attempt to wedge the lesion in the right upper lobe out.  We did palpate the lung to locate the lesion, but were not successful.  This will be continued to be followed.  AN/NUANCE  D:04/02/2018 T:04/02/2018 JOB:[id number]

## 2018-04-02 NOTE — Plan of Care (Signed)
  Problem: Education: Goal: Knowledge of General Education information will improve Description Including pain rating scale, medication(s)/side effects and non-pharmacologic comfort measures Outcome: Progressing   Problem: Health Behavior/Discharge Planning: Goal: Ability to manage health-related needs will improve Outcome: Progressing   Problem: Clinical Measurements: Goal: Will remain free from infection Outcome: Progressing Goal: Diagnostic test results will improve Outcome: Progressing Goal: Respiratory complications will improve Outcome: Progressing Goal: Cardiovascular complication will be avoided Outcome: Progressing   Problem: Elimination: Goal: Will not experience complications related to bowel motility Outcome: Progressing   Problem: Pain Managment: Goal: General experience of comfort will improve Outcome: Progressing   Problem: Safety: Goal: Ability to remain free from injury will improve Outcome: Progressing   Problem: Skin Integrity: Goal: Risk for impaired skin integrity will decrease Outcome: Progressing

## 2018-04-03 ENCOUNTER — Inpatient Hospital Stay (HOSPITAL_COMMUNITY): Payer: Medicare Other

## 2018-04-03 LAB — GLUCOSE, CAPILLARY
Glucose-Capillary: 111 mg/dL — ABNORMAL HIGH (ref 70–99)
Glucose-Capillary: 120 mg/dL — ABNORMAL HIGH (ref 70–99)
Glucose-Capillary: 123 mg/dL — ABNORMAL HIGH (ref 70–99)
Glucose-Capillary: 123 mg/dL — ABNORMAL HIGH (ref 70–99)
Glucose-Capillary: 131 mg/dL — ABNORMAL HIGH (ref 70–99)
Glucose-Capillary: 147 mg/dL — ABNORMAL HIGH (ref 70–99)
Glucose-Capillary: 151 mg/dL — ABNORMAL HIGH (ref 70–99)
Glucose-Capillary: 153 mg/dL — ABNORMAL HIGH (ref 70–99)

## 2018-04-03 LAB — BASIC METABOLIC PANEL
Anion gap: 6 (ref 5–15)
BUN: 31 mg/dL — ABNORMAL HIGH (ref 8–23)
CO2: 25 mmol/L (ref 22–32)
Calcium: 8.6 mg/dL — ABNORMAL LOW (ref 8.9–10.3)
Chloride: 107 mmol/L (ref 98–111)
Creatinine, Ser: 1.66 mg/dL — ABNORMAL HIGH (ref 0.61–1.24)
GFR calc Af Amer: 48 mL/min — ABNORMAL LOW (ref >60)
GFR calc non Af Amer: 41 mL/min — ABNORMAL LOW (ref >60)
Glucose, Bld: 139 mg/dL — ABNORMAL HIGH (ref 70–99)
Potassium: 4.1 mmol/L (ref 3.5–5.1)
Sodium: 138 mmol/L (ref 135–145)

## 2018-04-03 LAB — CBC
HCT: 36.5 % — ABNORMAL LOW (ref 39.0–52.0)
Hemoglobin: 11.6 g/dL — ABNORMAL LOW (ref 13.0–17.0)
MCH: 31.4 pg (ref 26.0–34.0)
MCHC: 31.8 g/dL (ref 30.0–36.0)
MCV: 98.9 fL (ref 80.0–100.0)
Platelets: 84 10*3/uL — ABNORMAL LOW (ref 150–400)
RBC: 3.69 MIL/uL — ABNORMAL LOW (ref 4.22–5.81)
RDW: 13.7 % (ref 11.5–15.5)
WBC: 12.1 10*3/uL — ABNORMAL HIGH (ref 4.0–10.5)
nRBC: 0 % (ref 0.0–0.2)

## 2018-04-03 MED ORDER — ALBUTEROL SULFATE (2.5 MG/3ML) 0.083% IN NEBU
2.5000 mg | INHALATION_SOLUTION | Freq: Four times a day (QID) | RESPIRATORY_TRACT | Status: DC | PRN
  Administered 2018-04-03 – 2018-04-04 (×4): 2.5 mg via RESPIRATORY_TRACT
  Filled 2018-04-03 (×4): qty 3

## 2018-04-03 MED ORDER — AMIODARONE LOAD VIA INFUSION
150.0000 mg | Freq: Once | INTRAVENOUS | Status: AC
  Administered 2018-04-03: 150 mg via INTRAVENOUS
  Filled 2018-04-03: qty 83.34

## 2018-04-03 MED ORDER — ACETYLCYSTEINE 20 % IN SOLN
4.0000 mL | Freq: Two times a day (BID) | RESPIRATORY_TRACT | Status: DC
  Administered 2018-04-03 – 2018-04-04 (×3): 4 mL via RESPIRATORY_TRACT
  Filled 2018-04-03 (×4): qty 4

## 2018-04-03 MED ORDER — ACETYLCYSTEINE 20 % IN SOLN
4.0000 mL | Freq: Three times a day (TID) | RESPIRATORY_TRACT | Status: DC
  Administered 2018-04-03: 4 mL via RESPIRATORY_TRACT
  Filled 2018-04-03 (×2): qty 4

## 2018-04-03 MED ORDER — FUROSEMIDE 10 MG/ML IJ SOLN
40.0000 mg | Freq: Once | INTRAMUSCULAR | Status: AC
  Administered 2018-04-03: 40 mg via INTRAVENOUS
  Filled 2018-04-03: qty 4

## 2018-04-03 MED ORDER — ACETYLCYSTEINE 10% NICU INHALATION SOLUTION: 2.0000 mL | Freq: Three times a day (TID) | RESPIRATORY_TRACT | Status: DC

## 2018-04-03 MED ORDER — AMIODARONE IV BOLUS ONLY 150 MG/100ML: 150.0000 mg | Freq: Once | INTRAVENOUS | Status: DC

## 2018-04-03 MED ORDER — TRAMADOL HCL 50 MG PO TABS
50.0000 mg | ORAL_TABLET | ORAL | Status: DC | PRN
  Administered 2018-04-05 – 2018-04-08 (×3): 50 mg via ORAL
  Filled 2018-04-03 (×3): qty 1

## 2018-04-03 MED ORDER — AMIODARONE HCL IN DEXTROSE 360-4.14 MG/200ML-% IV SOLN
30.0000 mg/h | INTRAVENOUS | Status: DC
  Administered 2018-04-03 – 2018-04-05 (×6): 30 mg/h via INTRAVENOUS
  Filled 2018-04-03 (×4): qty 200

## 2018-04-03 MED ORDER — AMIODARONE HCL IN DEXTROSE 360-4.14 MG/200ML-% IV SOLN
60.0000 mg/h | INTRAVENOUS | Status: AC
  Administered 2018-04-03 (×2): 60 mg/h via INTRAVENOUS
  Filled 2018-04-03 (×3): qty 200

## 2018-04-03 MED ORDER — POTASSIUM CHLORIDE CRYS ER 20 MEQ PO TBCR
20.0000 meq | EXTENDED_RELEASE_TABLET | Freq: Every day | ORAL | Status: DC
  Administered 2018-04-03 – 2018-04-07 (×5): 20 meq via ORAL
  Filled 2018-04-03 (×5): qty 1

## 2018-04-03 MED ORDER — FUROSEMIDE 10 MG/ML IJ SOLN
80.0000 mg | Freq: Two times a day (BID) | INTRAMUSCULAR | Status: DC
  Administered 2018-04-03 – 2018-04-04 (×2): 80 mg via INTRAVENOUS
  Filled 2018-04-03 (×2): qty 8

## 2018-04-03 MED ORDER — FUROSEMIDE 10 MG/ML IJ SOLN
40.0000 mg | Freq: Two times a day (BID) | INTRAMUSCULAR | Status: DC
  Administered 2018-04-03: 40 mg via INTRAVENOUS
  Filled 2018-04-03: qty 4

## 2018-04-03 MED ORDER — OXYCODONE HCL 5 MG PO TABS
5.0000 mg | ORAL_TABLET | ORAL | Status: DC | PRN
  Administered 2018-04-04: 5 mg via ORAL
  Filled 2018-04-03: qty 1

## 2018-04-03 NOTE — Discharge Instructions (Signed)

## 2018-04-03 NOTE — Progress Notes (Signed)
CT surgery p.m. Rounds  Patient is converted to sinus rhythm on IV amiodarone and now is a paced Urine output 750 cc on first shift and increasing

## 2018-04-03 NOTE — Discharge Summary (Signed)
Physician Discharge Summary  Patient ID: Nathan Medina MRN: [id number] DOB/AGE: 68-Sep-1952 68 y.o.  Admit date: 04/01/2018 Discharge date: 04/09/2018  Admission Diagnoses:  Patient Active Problem List   Diagnosis Date Noted  . Coronary artery disease 01/15/2018  . S/P thyroidectomy 01/15/2018  . S/P cardiac cath 01/14/2018  . Abnormal stress test 01/12/2018  . 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 Multiple thyroid nodules 10/16/2017  . Peritonsillar abscess 08/01/2016  . Renal failure (ARF), acute on chronic (HCC) 08/01/2016  . Tonsil, abscess 07/31/2016  . Back pain 04/03/2012  . Ventral hernia 10/22/2011  . Lumbar radicular pain 06/03/2011  . PSA elevation 06/01/2011  . Left renal mass 05/23/2011  . Abnormal liver function test 05/23/2011  . Rash 04/02/2011  . Hearing loss 11/28/2010  . Preventative health care 09/17/2010  . 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 PERIPHERAL EDEMA 11/04/2009  . Epidermal cyst 08/01/2007  . Type 2 diabetes mellitus with renal manifestations ([place]) 06/25/2007  . HYPERLIPIDEMIA 06/25/2007  . GOUT 06/25/2007  . ANXIETY 06/25/2007  . DEPRESSION 06/25/2007  . Essential hypertension 06/25/2007  . ALLERGIC RHINITIS 06/25/2007  . GERD 06/25/2007  . IBS 06/25/2007  . COLONIC POLYPS, HX OF 06/25/2007   Discharge Diagnoses:   Patient Active Problem List   Diagnosis Date Noted  . 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 Coronary artery disease 01/15/2018  . S/P thyroidectomy 01/15/2018  . S/P cardiac cath 01/14/2018  . Abnormal stress test 01/12/2018  . 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 Multiple thyroid nodules 10/16/2017  . Peritonsillar abscess 08/01/2016  . Renal failure (ARF), acute on chronic (HCC) 08/01/2016  . Tonsil, abscess 07/31/2016  . Back pain 04/03/2012  . Ventral hernia 10/22/2011  . Lumbar radicular pain 06/03/2011  . PSA elevation 06/01/2011  . Left renal mass 05/23/2011  .  Abnormal liver function test 05/23/2011  . Rash 04/02/2011  . Hearing loss 11/28/2010  . Preventative health care 09/17/2010  . 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 PERIPHERAL EDEMA 11/04/2009  . Epidermal cyst 08/01/2007  . 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 HYPERLIPIDEMIA 06/25/2007  . GOUT 06/25/2007  . ANXIETY 06/25/2007  . DEPRESSION 06/25/2007  . Essential hypertension 06/25/2007  . ALLERGIC RHINITIS 06/25/2007  . GERD 06/25/2007  . IBS 06/25/2007  . COLONIC POLYPS, HX OF 06/25/2007   Discharged Condition: good  History of Present Illness:  [name] is a 67 yo male first evaluated by Dr. Servando Snare in March of 2018 for a ground glass opacity and right upper lobe nodule identified on CT head and neck.  Follow up CT scan in March of 2019 showed the nodule to be increased in size.  PET CT scan was identified and showed low metabolic uptake.  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  He was also noted to have a renal mass and has been evaluated by Nephrology.  During these instances and EKG was obtained and was abnormal resulting in referral Cardiology for further care.  He underwent stress testing and Echocardiogram which were concerning for ischemia.  He underwent cardiac catheterization which showed a reduced EF and multivessel CAD.  It was felt coronary bypass grafting would be indicated and TCTS consult was requested.  He was evaluated by Dr. Servando Snare  on 03/20/2018.  He was in agreement the patient would benefit from bypass surgery.  The risks and benefits of the procedure were explained  to the patient and he was agreeable to proceed.  Hospital Course:   Mr. Nathan Medina presented to [facility] on 04/01/2018.  He was taken to the operating room on 04/01/2018.  He underwent CABG x 3 utilizing LIMA to LAD, SVG to Distal RCA, and SVG to Ramus Intermediate.  He also underwent endoscopic  harvest of greater saphenous vein from his right leg.  He tolerated the procedure without difficulty and was taken to the SICU in stable condition.  He was extubated the evening of surgery.  During his stay in the SICU the patient was weaned off Dopamine and Milrinone as tolerated.  His chest tubes and arterial lines were removed without difficulty.  He developed rapid Atrial Fibrillation.  He was treated with IV Amiodarone.  He initially did not convert to NSR.  He required additional bolus of Amiodarone.  He converted to NSR and was transitioned to an oral regimen of the medication.  He ultimately became bradycardic and Lopressor and Amiodarone were discontinued.  He continues to maintain NSR. He was aggressively diuresed with Lasix for pulmonary edema and small pleural effusion.  His creatinine was mildly elevated.  He has baseline renal insufficiency and his creatinine has returned to his baseline level.  He was medically stable and was transferred to the telemetry unit on 04/08/2018.  He continued to do well overnight.  He remains in NSR with occasional PVC.  His heart rate remains in the 60s and he will not be discharged on a beta blocker.  His pacing wires were removed prior to discharge without difficulty.  His incisions are healing without evidence of infection.  He is ambulating without difficulty.  He is medically stable for discharge home today.     Significant Diagnostic Studies: angiography:   LM: Normal LAD: Ostial 100% occluded. 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 RPDA distally occluded.           Good surgical revascularization targets seen.  Treatments: surgery:    Coronary artery bypass grafting x3 with the left internal mammary to the left anterior descending coronary artery, reverse saphenous vein  graft to the distal right coronary artery, reverse saphenous vein graft to the ramus  intermedius with right thigh greater saphenous endoscopic vein harvesting and cardiopulmonary bypass.  Discharge Exam: Blood pressure 129/77, pulse (!) 51, temperature 97.8 F (36.6 C), temperature source Oral, resp. rate 19, weight 91.8 kg, SpO2 91 %.  General appearance: alert, cooperative and no distress Heart: regular rate and rhythm Lungs: clear to auscultation bilaterally Abdomen: soft, non-tender; bowel sounds normal; no masses,  no organomegaly Extremities: edema trace Wound: clean and dry  Discharge disposition: 01-Home or Self Care  Discharge Medications:  The patient has been discharged on:   1.Beta Blocker:  Yes [   ]                              No   [ X  ]                              If No, reason: Bradycardia  2.Ace Inhibitor/ARB: Yes [   ]  No  [  x  ]                                     If No, reason: elevated creatinine 3.Statin:   Yes [ X  ]                  No  [   ]                  If No, reason:  4.Ecasa:  Yes  [ X  ]                  No   [   ]                  If No, reason:      Allergies as of 04/09/2018      Reactions   Ciprofloxacin Rash   Escitalopram Oxalate Itching   Quinapril Hcl Rash   Sulfa Antibiotics Swelling   Swelling in the ankles      Medication List    STOP taking these medications   carvedilol 6.25 MG tablet Commonly known as:  COREG   ibuprofen 200 MG tablet Commonly known as:  ADVIL,MOTRIN   isosorbide-hydrALAZINE 20-37.5 MG tablet Commonly known as:  BIDIL     TAKE these medications   acetaminophen 325 MG tablet Commonly known as:  TYLENOL Take 2 tablets (650 mg total) by mouth every 6 (six) hours as needed for mild pain (or Fever >/= 101). What changed:  how much to take   allopurinol 300 MG tablet Commonly known as:  ZYLOPRIM TAKE 1 TABLET BY MOUTH EVERY DAY   ALPRAZolam 0.5 MG  tablet Commonly known as:  XANAX Take 1 tablet (0.5 mg total) by mouth daily as needed for sleep. 1/2 - 1 by mouth once daily as needed What changed:    how much to take  when to take this  additional instructions   aspirin 325 MG EC tablet Take 1 tablet (325 mg total) by mouth daily. What changed:    medication strength  how much to take  when to take this   atorvastatin 10 MG tablet Commonly known as:  LIPITOR Take 10 mg by mouth at bedtime.   calcium carbonate 500 MG chewable tablet Commonly known as:  TUMS - dosed in mg elemental calcium Chew 2 tablets (400 mg of elemental calcium total) by mouth 2 (two) times daily. What changed:    when to take this  reasons to take this   cyclobenzaprine 5 MG tablet Commonly known as:  FLEXERIL Take 5 mg by mouth 3 (three) times daily as needed for muscle spasms.   diphenhydrAMINE 25 MG tablet Commonly known as:  BENADRYL Take 25 mg by mouth at bedtime as needed for sleep.   EXCEDRIN EXTRA STRENGTH 250-250-65 MG tablet Generic drug:  aspirin-acetaminophen-caffeine Take 1-2 tablets by mouth every 6 (six) hours as needed for headache.   furosemide 40 MG tablet Commonly known as:  LASIX Take 120 mg by mouth daily.   Guaifenesin 1200 MG Tb12 Take 1,200 mg by mouth 2 (two) times daily as needed (cough).   insulin glargine 100 unit/mL Sopn Commonly known as:  LANTUS Inject 40 Units into the skin at bedtime.   levothyroxine 175 MCG tablet Commonly known as:  SYNTHROID, LEVOTHROID Take 175 mcg by mouth daily before breakfast.  omega-3 acid ethyl esters 1 g capsule Commonly known as:  LOVAZA Take 2 g by mouth 2 (two) times daily.   omeprazole 20 MG capsule Commonly known as:  PRILOSEC Take 20 mg by mouth daily before breakfast.   potassium chloride 10 MEQ tablet Commonly known as:  K-DUR,KLOR-CON Take 1 tablet (10 mEq total) by mouth daily.   PRESERVISION AREDS 2 PO Take 1 tablet by mouth 2 (two) times daily.    sildenafil 20 MG tablet Commonly known as:  REVATIO Take 20 mg by mouth daily as needed.   traMADol 50 MG tablet Commonly known as:  ULTRAM Take 1-2 tablets (50-100 mg total) by mouth every 6 (six) hours as needed for moderate pain. What changed:  Another medication with the same name was added. Make sure you understand how and when to take each.   traMADol 50 MG tablet Commonly known as:  ULTRAM Take 1 tablet (50 mg total) by mouth every 4 (four) hours as needed for moderate pain. What changed:  You were already taking a medication with the same name, and this prescription was added. Make sure you understand how and when to take each.   triamcinolone cream 0.1 % Commonly known as:  KENALOG Apply 1 application topically 2 (two) times daily as needed (skin irritation).   Vitamin D-3 125 MCG (5000 UT) Tabs Take 5,000 Units by mouth at bedtime.   vitamin E 1000 UNIT capsule Generic drug:  vitamin E Take 1,000 Units by mouth at bedtime.      Follow-up Information    Grace Isaac, MD Follow up on 05/08/2018.   Specialty:  Cardiothoracic Surgery Why:  Appointment is at 11:30, please get CXR at 11:00 at [facility] located on first floor of our office building Contact information: [place] [address] [phone]        [name], MD Follow up on 04/14/2018.   Specialty:  Cardiology Why:  Appointment is at 12:00 Contact information: [place] [address] [phone]        [facility] Follow up.   Specialty:  Home Health Services Why:  Home Health Physical Therapy and Occupational Therapy Contact information: [place] [address] [phone]           Signed: Ellwood Handler 04/09/2018, 9:21 AM

## 2018-04-03 NOTE — Progress Notes (Signed)
Patient in  atrial fibrillation  with rate of 180 as he stood up to ambulate. Heart rate settled down in the 150s after the adminstration of 5mg  metoprolol. Amiodarone infusion with 150mg  bolus loading dose given per oncall MD. Will monitor closely.

## 2018-04-03 NOTE — Progress Notes (Addendum)
TCTS DAILY ICU PROGRESS NOTE                   [address]            [place] [address]          [phone]   2 Days Post-Op Procedure(s) (LRB): CORONARY ARTERY BYPASS GRAFTING (CABG) x Three, using left internal mammary artery and right leg greater saphenous vein harvested endoscopically (N/A) TRANSESOPHAGEAL ECHOCARDIOGRAM (TEE) (N/A)  Total Length of Stay:  LOS: 2 days   Subjective:  Patient is having difficulty staying awake this morning.  He feels lousy overall.  + Ambulation with assistance, no BM yet  Objective: Vital signs in last 24 hours: Temp:  [97.5 F (36.4 C)-99.3 F (37.4 C)] 97.6 F (36.4 C) (11/14 0353) Pulse Rate:  [37-81] 58 (11/14 0700) Cardiac Rhythm: Atrial fibrillation (11/14 0645) Resp:  [13-34] 14 (11/14 0700) BP: (82-134)/(61-77) 82/61 (11/14 0700) SpO2:  [86 %-100 %] 96 % (11/14 0700) Weight:  [92.1 kg] 92.1 kg (11/14 0500)  Filed Weights   04/02/18 0600 04/03/18 0500  Weight: 93.5 kg 92.1 kg    Weight change: -1.4 kg   Intake/Output from previous day: 11/13 0701 - 11/14 0700 In: 929.8 [P.O.:120; I.V.:709.7; IV Piggyback:100] Out: 1110 [Urine:930; Chest Tube:180]  Current Meds: Scheduled Meds: . acetaminophen  1,000 mg Oral Q6H   Or  . acetaminophen (TYLENOL) oral liquid 160 mg/5 mL  1,000 mg Per Tube Q6H  . acetylcysteine  2 mL Nebulization TID  . allopurinol  300 mg Oral Daily  . aspirin EC  325 mg Oral Daily   Or  . aspirin  324 mg Per Tube Daily  . atorvastatin  10 mg Oral QHS  . bisacodyl  10 mg Oral Daily   Or  . bisacodyl  10 mg Rectal Daily  . docusate sodium  200 mg Oral Daily  . furosemide  40 mg Intravenous Once  . insulin aspart  0-24 Units Subcutaneous Q4H  . insulin detemir  15 Units Subcutaneous Daily  . levothyroxine  175 mcg Oral QAC breakfast  . metoprolol tartrate  12.5 mg Oral BID   Or  . metoprolol tartrate  12.5 mg Per Tube BID  . omega-3 acid ethyl esters  2 g Oral BID  .  pantoprazole  40 mg Oral Daily  . sodium chloride flush  3 mL Intravenous Q12H   Continuous Infusions: . sodium chloride    . amiodarone 60 mg/hr (04/03/18 0700)   Followed by  . amiodarone    . dexmedetomidine (PRECEDEX) IV infusion Stopped (04/01/18 1631)  . DOPamine 1.25 mcg/kg/min (04/03/18 0700)  . lactated ringers    . lactated ringers    . lactated ringers 20 mL/hr at 04/03/18 0700  . milrinone 0.15 mcg/kg/min (04/03/18 0700)   PRN Meds:.ALPRAZolam, lactated ringers, metoprolol tartrate, midazolam, morphine injection, ondansetron (ZOFRAN) IV, oxyCODONE, sodium chloride flush, traMADol  General appearance: alert and no distress Heart: irregularly irregular rhythm Lungs: diminished breath sounds bilaterally Abdomen: soft, non-tender; bowel sounds normal; no masses,  no organomegaly Extremities: edema 1_ Wound: clean and dry, aquacel remains in place on sternotomy  Lab Results: CBC: Recent Labs    04/02/18 1731 04/02/18 1739 04/03/18 0433  WBC 10.6*  --  12.1*  HGB 12.0* 11.9* 11.6*  HCT 37.5* 35.0* 36.5*  PLT 79*  --  84*   BMET:  Recent Labs    04/02/18 0350  04/02/18 1739 04/03/18 0433  NA  141  --  140 138  K 3.8  --  4.2 4.1  CL 113*  --  104 107  CO2 23  --   --  25  GLUCOSE 102*  --  208* 139*  BUN 22  --  28* 31*  CREATININE 1.36*   < > 1.60* 1.66*  CALCIUM 8.8*  --   --  8.6*   < > = values in this interval not displayed.    CMET: Lab Results  Component Value Date   WBC 12.1 (H) 04/03/2018   HGB 11.6 (L) 04/03/2018   HCT 36.5 (L) 04/03/2018   PLT 84 (L) 04/03/2018   GLUCOSE 139 (H) 04/03/2018   CHOL 190 04/01/2012   TRIG (H) 04/01/2012    525.0 Triglyceride is over 400; calculations on Lipids are invalid.   HDL 27.50 (L) 04/01/2012   LDLDIRECT 84.5 04/01/2012   LDLCALC 32 04/03/2010   ALT 34 03/28/2018   AST 26 03/28/2018   NA 138 04/03/2018   K 4.1 04/03/2018   CL 107 04/03/2018   CREATININE 1.66 (H) 04/03/2018   BUN 31 (H)  04/03/2018   CO2 25 04/03/2018   TSH 2.28 04/01/2012   PSA 3.39 04/01/2012   INR 1.55 04/01/2018   HGBA1C 6.2 (H) 02/14/2018   MICROALBUR 12.2 (H) 04/01/2012      PT/INR:  Recent Labs    04/01/18 1327  LABPROT 18.4*  INR 1.55   Radiology: No results found.   Assessment/Plan: S/P Procedure(s) (LRB): CORONARY ARTERY BYPASS GRAFTING (CABG) x Three, using left internal mammary artery and right leg greater saphenous vein harvested endoscopically (N/A) TRANSESOPHAGEAL ECHOCARDIOGRAM (TEE) (N/A)  1. CV- Rapid Atrial fibrillation, received bolus around 0630, drips is running- will likely need a repeat bolus of Amiodarone if he doesn't convert, he received IV Lopressor as well, however he  Hypotensive and will likely not tolerate a scheduled oral dose.... On low dose Milrinone can possibly d/c today, weaning Dopamine as tolerated 2. Pulm- worsening atelectasis on CXR this morning with pulmonary edema, patient is very sleepy not using IS much, will add flutter valve, start Mucomyst... Chest tubes remain in place, <200 cc output yesterday, may be able to remove today vs. Tomorrow 3. Renal- creatinine stable at 1.66, will repeat IV dose of Lasix today, K is at 4.1, will start potassium supplementation 4. Expected post operative blood loss anemia mild 5. Expected thrombocytopenia, stable at 84 6. DM- sugars controlled, continue insulin 7. Dispo- patient with rapid Atrial Fibrillation this morning, on Amiodarone drip, will likely need repeat bolus if doesn't convert, worsening atelectasis, pulmonary edema on CXR... Will repeat IV Lasix, add flutter valve, mucomyst nebs, watch creatinine has been stable at 1.66, continue current care     Erin Barrett 04/03/2018 7:50 AM   Increased effusion on right Increase lasix dose In rapid a fib this am, rebolus with cordrone  Decrease pain meds due to sedation  I have seen and examined [place] Neighbor and agree with the above assessment  and  plan.  Grace Isaac MD [facility] [phone] Office [phone] 04/03/2018 8:20 AM

## 2018-04-04 ENCOUNTER — Inpatient Hospital Stay (HOSPITAL_COMMUNITY): Payer: Medicare Other

## 2018-04-04 ENCOUNTER — Inpatient Hospital Stay: Payer: Self-pay

## 2018-04-04 LAB — CBC
HCT: 36.7 % — ABNORMAL LOW (ref 39.0–52.0)
Hemoglobin: 11.2 g/dL — ABNORMAL LOW (ref 13.0–17.0)
MCH: 29.8 pg (ref 26.0–34.0)
MCHC: 30.5 g/dL (ref 30.0–36.0)
MCV: 97.6 fL (ref 80.0–100.0)
Platelets: 95 10*3/uL — ABNORMAL LOW (ref 150–400)
RBC: 3.76 MIL/uL — ABNORMAL LOW (ref 4.22–5.81)
RDW: 13.4 % (ref 11.5–15.5)
WBC: 9.3 10*3/uL (ref 4.0–10.5)
nRBC: 0 % (ref 0.0–0.2)

## 2018-04-04 LAB — GLUCOSE, CAPILLARY
Glucose-Capillary: 126 mg/dL — ABNORMAL HIGH (ref 70–99)
Glucose-Capillary: 132 mg/dL — ABNORMAL HIGH (ref 70–99)
Glucose-Capillary: 122 mg/dL — ABNORMAL HIGH (ref 70–99)
Glucose-Capillary: 151 mg/dL — ABNORMAL HIGH (ref 70–99)

## 2018-04-04 LAB — COOXEMETRY PANEL
Carboxyhemoglobin: 1.1 % (ref 0.5–1.5)
Methemoglobin: 2.2 % — ABNORMAL HIGH (ref 0.0–1.5)
O2 Saturation: 69.6 %
Total hemoglobin: 11.8 g/dL — ABNORMAL LOW (ref 12.0–16.0)

## 2018-04-04 LAB — BASIC METABOLIC PANEL
Anion gap: 7 (ref 5–15)
BUN: 36 mg/dL — ABNORMAL HIGH (ref 8–23)
CO2: 26 mmol/L (ref 22–32)
Calcium: 8.7 mg/dL — ABNORMAL LOW (ref 8.9–10.3)
Chloride: 106 mmol/L (ref 98–111)
Creatinine, Ser: 1.75 mg/dL — ABNORMAL HIGH (ref 0.61–1.24)
GFR calc Af Amer: 45 mL/min — ABNORMAL LOW (ref >60)
GFR calc non Af Amer: 39 mL/min — ABNORMAL LOW (ref >60)
Glucose, Bld: 134 mg/dL — ABNORMAL HIGH (ref 70–99)
Potassium: 3.7 mmol/L (ref 3.5–5.1)
Sodium: 139 mmol/L (ref 135–145)

## 2018-04-04 MED ORDER — ALBUTEROL SULFATE (2.5 MG/3ML) 0.083% IN NEBU
2.5000 mg | INHALATION_SOLUTION | Freq: Three times a day (TID) | RESPIRATORY_TRACT | Status: DC
  Administered 2018-04-05 – 2018-04-07 (×5): 2.5 mg via RESPIRATORY_TRACT
  Filled 2018-04-04 (×7): qty 3

## 2018-04-04 MED ORDER — SODIUM CHLORIDE 0.9% FLUSH
10.0000 mL | Freq: Two times a day (BID) | INTRAVENOUS | Status: DC
  Administered 2018-04-04 – 2018-04-05 (×3): 10 mL
  Administered 2018-04-06: 20 mL
  Administered 2018-04-06: 10 mL
  Administered 2018-04-07: 30 mL
  Administered 2018-04-07: 40 mL
  Administered 2018-04-08: 30 mL

## 2018-04-04 MED ORDER — CHLORHEXIDINE GLUCONATE CLOTH 2 % EX PADS
6.0000 | MEDICATED_PAD | Freq: Every day | CUTANEOUS | Status: DC
  Administered 2018-04-05 – 2018-04-07 (×4): 6 via TOPICAL

## 2018-04-04 MED ORDER — SODIUM CHLORIDE 0.9% FLUSH: 10.0000 mL | INTRAVENOUS | Status: DC | PRN

## 2018-04-04 MED ORDER — FUROSEMIDE 10 MG/ML IJ SOLN
40.0000 mg | Freq: Two times a day (BID) | INTRAMUSCULAR | Status: DC
  Administered 2018-04-04 – 2018-04-05 (×2): 40 mg via INTRAVENOUS
  Filled 2018-04-04 (×2): qty 4

## 2018-04-04 NOTE — Progress Notes (Signed)
Patient ID: Nathan Medina, male   DOB: [birth year], 67 y.o.   MRN: [id number] EVENING ROUNDS NOTE :     [address]       [place] [address]             [phone]                 3 Days Post-Op Procedure(s) (LRB): CORONARY ARTERY BYPASS GRAFTING (CABG) x Three, using left internal mammary artery and right leg greater saphenous vein harvested endoscopically (N/A) TRANSESOPHAGEAL ECHOCARDIOGRAM (TEE) (N/A)  Total Length of Stay:  LOS: 3 days  BP 132/80   Pulse 72   Temp 97.6 F (36.4 C) (Oral)   Resp (!) 21   Wt 95.1 kg   SpO2 100%   BMI 32.84 kg/m   .Intake/Output      11/14 0701 - 11/15 0700 11/15 0701 - 11/16 0700   P.O.  120   I.V. (mL/kg) 1258.4 (13.2) 292.5 (3.1)   IV Piggyback     Total Intake(mL/kg) 1258.4 (13.2) 412.5 (4.3)   Urine (mL/kg/hr) 1715 (0.8) 1370 (1.6)   Chest Tube 20    Total Output 1735 1370   Net -476.6 -957.5          . sodium chloride    . amiodarone 30 mg/hr (04/04/18 1559)  . DOPamine 1.25 mcg/kg/min (04/04/18 1559)  . lactated ringers    . lactated ringers    . lactated ringers 20 mL/hr at 04/04/18 1400     Lab Results  Component Value Date   WBC 9.3 04/04/2018   HGB 11.2 (L) 04/04/2018   HCT 36.7 (L) 04/04/2018   PLT 95 (L) 04/04/2018   GLUCOSE 134 (H) 04/04/2018   CHOL 190 04/01/2012   TRIG (H) 04/01/2012    525.0 Triglyceride is over 400; calculations on Lipids are invalid.   HDL 27.50 (L) 04/01/2012   LDLDIRECT 84.5 04/01/2012   LDLCALC 32 04/03/2010   ALT 34 03/28/2018   AST 26 03/28/2018   NA 139 04/04/2018   K 3.7 04/04/2018   CL 106 04/04/2018   CREATININE 1.75 (H) 04/04/2018   BUN 36 (H) 04/04/2018   CO2 26 04/04/2018   TSH 2.28 04/01/2012   PSA 3.39 04/01/2012   INR 1.55 04/01/2018   HGBA1C 6.2 (H) 02/14/2018   MICROALBUR 12.2 (H) 04/01/2012   pic in place and sleeve out  Says mouth sore opening and closing mouth  Grace Isaac MD  [place] Office [phone] 04/04/2018  4:09 PM

## 2018-04-04 NOTE — Progress Notes (Signed)
Patient ID: ADE STMARIE, male   DOB: 20-Jul-1950, 67 y.o.   MRN: [id number] TCTS DAILY ICU PROGRESS NOTE                   [address]            [place] [address]          [phone]   3 Days Post-Op Procedure(s) (LRB): CORONARY ARTERY BYPASS GRAFTING (CABG) x Three, using left internal mammary artery and right leg greater saphenous vein harvested endoscopically (N/A) TRANSESOPHAGEAL ECHOCARDIOGRAM (TEE) (N/A)  Total Length of Stay:  LOS: 3 days   Subjective: Patient awake alert, pain control is good, less sleepy than yesterday, is walking some  Objective: Vital signs in last 24 hours: Temp:  [97.6 F (36.4 C)-98.5 F (36.9 C)] 97.6 F (36.4 C) (11/15 0400) Pulse Rate:  [44-130] 80 (11/15 0700) Cardiac Rhythm: Atrial fibrillation (11/15 0000) Resp:  [13-20] 18 (11/15 0700) BP: (83-119)/(53-87) 90/62 (11/15 0700) SpO2:  [94 %-100 %] 97 % (11/15 0700) Weight:  [95.1 kg] 95.1 kg (11/15 0700)  Filed Weights   04/02/18 0600 04/03/18 0500 04/04/18 0700  Weight: 93.5 kg 92.1 kg 95.1 kg    Weight change: 3 kg   Hemodynamic parameters for last 24 hours:    Intake/Output from previous day: 11/14 0701 - 11/15 0700 In: 1258.4 [I.V.:1258.4] Out: 1735 [Urine:1715; Chest Tube:20]  Intake/Output this shift: No intake/output data recorded.  Current Meds: Scheduled Meds: . acetaminophen  1,000 mg Oral Q6H   Or  . acetaminophen (TYLENOL) oral liquid 160 mg/5 mL  1,000 mg Per Tube Q6H  . acetylcysteine  4 mL Nebulization BID  . allopurinol  300 mg Oral Daily  . aspirin EC  325 mg Oral Daily   Or  . aspirin  324 mg Per Tube Daily  . atorvastatin  10 mg Oral QHS  . bisacodyl  10 mg Oral Daily   Or  . bisacodyl  10 mg Rectal Daily  . docusate sodium  200 mg Oral Daily  . furosemide  80 mg Intravenous BID  . insulin aspart  0-24 Units Subcutaneous Q4H  . insulin detemir  15 Units Subcutaneous Daily  . levothyroxine  175 mcg Oral QAC breakfast  .  metoprolol tartrate  12.5 mg Oral BID   Or  . metoprolol tartrate  12.5 mg Per Tube BID  . omega-3 acid ethyl esters  2 g Oral BID  . pantoprazole  40 mg Oral Daily  . potassium chloride  20 mEq Oral Daily  . sodium chloride flush  3 mL Intravenous Q12H   Continuous Infusions: . sodium chloride    . amiodarone 30 mg/hr (04/04/18 0700)  . dexmedetomidine (PRECEDEX) IV infusion Stopped (04/01/18 1631)  . DOPamine 1.25 mcg/kg/min (04/04/18 0700)  . lactated ringers    . lactated ringers    . lactated ringers 20 mL/hr at 04/04/18 0700  . milrinone 0.15 mcg/kg/min (04/04/18 0700)   PRN Meds:.albuterol, ALPRAZolam, lactated ringers, metoprolol tartrate, midazolam, ondansetron (ZOFRAN) IV, oxyCODONE, sodium chloride flush, traMADol  General appearance: alert and cooperative Neurologic: intact Heart: regular rate and rhythm, S1, S2 normal, no murmur, click, rub or gallop Lungs: diminished breath sounds bibasilar Abdomen: soft, non-tender; bowel sounds normal; no masses,  no organomegaly Extremities: extremities normal, atraumatic, no cyanosis or edema and Homans sign is negative, no sign of DVT Wound: Sternum is stable wound intact  Lab Results: CBC: Recent Labs    04/03/18 0433  04/04/18 0558  WBC 12.1* 9.3  HGB 11.6* 11.2*  HCT 36.5* 36.7*  PLT 84* 95*   BMET:  Recent Labs    04/03/18 0433 04/04/18 0558  NA 138 139  K 4.1 3.7  CL 107 106  CO2 25 26  GLUCOSE 139* 134*  BUN 31* 36*  CREATININE 1.66* 1.75*  CALCIUM 8.6* 8.7*    CMET: Lab Results  Component Value Date   WBC 9.3 04/04/2018   HGB 11.2 (L) 04/04/2018   HCT 36.7 (L) 04/04/2018   PLT 95 (L) 04/04/2018   GLUCOSE 134 (H) 04/04/2018   CHOL 190 04/01/2012   TRIG (H) 04/01/2012    525.0 Triglyceride is over 400; calculations on Lipids are invalid.   HDL 27.50 (L) 04/01/2012   LDLDIRECT 84.5 04/01/2012   LDLCALC 32 04/03/2010   ALT 34 03/28/2018   AST 26 03/28/2018   NA 139 04/04/2018   K 3.7  04/04/2018   CL 106 04/04/2018   CREATININE 1.75 (H) 04/04/2018   BUN 36 (H) 04/04/2018   CO2 26 04/04/2018   TSH 2.28 04/01/2012   PSA 3.39 04/01/2012   INR 1.55 04/01/2018   HGBA1C 6.2 (H) 02/14/2018   MICROALBUR 12.2 (H) 04/01/2012      PT/INR:  Recent Labs    04/01/18 1327  LABPROT 18.4*  INR 1.55   Radiology: No results found.   Assessment/Plan: S/P Procedure(s) (LRB): CORONARY ARTERY BYPASS GRAFTING (CABG) x Three, using left internal mammary artery and right leg greater saphenous vein harvested endoscopically (N/A) TRANSESOPHAGEAL ECHOCARDIOGRAM (TEE) (N/A) Mobilize Diuresis Diabetes control Patient is now paced atrially, has had intermittent atrial fibrillation but now better controlled on IV amnio, will convert to p.o. Urine output increased, creatinine 1.75  , patient has baseline chronic renal insufficiency [facility] today, wean off milrinone Place PICC line today DC central line Leave Foley 1 more day for urine output monitoring   Grace Isaac 04/04/2018 7:17 AM

## 2018-04-04 NOTE — Progress Notes (Signed)
Peripherally Inserted Central Catheter/Midline Placement  The IV Nurse has discussed with the patient and/or persons authorized to consent for the patient, the purpose of this procedure and the potential benefits and risks involved with this procedure.  The benefits include less needle sticks, lab draws from the catheter, and the patient may be discharged home with the catheter. Risks include, but not limited to, infection, bleeding, blood clot (thrombus formation), and puncture of an artery; nerve damage and irregular heartbeat and possibility to perform a PICC exchange if needed/ordered by physician.  Alternatives to this procedure were also discussed.  Bard Power PICC patient education guide, fact sheet on infection prevention and patient information card has been provided to patient /or left at bedside.    PICC/Midline Placement Documentation  PICC Triple Lumen 04/04/18 PICC Right Brachial 42 cm 2 cm (Active)  Indication for Insertion or Continuance of Line Vasoactive infusions 04/04/2018  3:09 PM  Exposed Catheter (cm) 2 cm 04/04/2018  3:09 PM  Site Assessment Clean;Dry;Intact 04/04/2018  3:09 PM  Lumen #1 Status Flushed;Saline locked;Blood return noted 04/04/2018  3:09 PM  Lumen #2 Status Flushed;Saline locked;Blood return noted 04/04/2018  3:09 PM  Lumen #3 Status Flushed;Saline locked;Blood return noted 04/04/2018  3:09 PM  Dressing Type Transparent 04/04/2018  3:09 PM  Dressing Status Clean;Dry;Intact;Antimicrobial disc in place 04/04/2018  3:09 PM  Dressing Change Due 04/11/18 04/04/2018  3:09 PM       Sheetal Lyall, Nicolette Bang 04/04/2018, 3:13 PM

## 2018-04-04 NOTE — Evaluation (Signed)
Physical Therapy Evaluation Patient Details Name: Nathan Medina MRN: [id number] DOB: [birth year] Today's Date: 04/04/2018   History of Present Illness  Pt is a 67 y.o. 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 Recent PET scan showed low uptake subscapsular lesion in left kidney which is highly suspicious for renal cell carcinoma.   Clinical Impression  Patient admitted with above diagnosis. On PT evaluation, patient motivated to participate and verbalizing good pain control. Ambulating 348 feet with Harmon Pier walker and supervision. Suspect patient will progress very well with mobility. Reviewed sternal precautions but will likely need reinforcement. Will need stair training prior to discharge.   Prior to mobility: 80 HR (paced), 148/86, 100% SpO2 via 4L O2 Post mobility: 143/95, 100% SpO2 via 4L O2     Follow Up Recommendations Outpatient PT (cardiac rehab)    Equipment Recommendations  None recommended by PT    Recommendations for Other Services OT consult     Precautions / Restrictions Precautions Precautions: Sternal Precaution Booklet Issued: Yes (comment) Precaution Comments: Verbally reviewed and provided written handout Restrictions Weight Bearing Restrictions: Yes Other Position/Activity Restrictions: sternal precautions, temporary pacemaker     Mobility  Bed Mobility               General bed mobility comments: OOB in chair  Transfers Overall transfer level: Modified independent               General transfer comment: Good use of lower extremities to power up to standing  Ambulation/Gait Ambulation/Gait assistance: Supervision Gait Distance (Feet): 348 Feet Assistive device: (Eva walker) Gait Pattern/deviations: WFL(Within Functional Limits)   Gait velocity interpretation: 1.31 - 2.62 ft/sec, indicative of limited community ambulator General Gait  Details: Pt with good gait speed and no evidence of imbalance  Stairs            Wheelchair Mobility    Modified Rankin (Stroke Patients Only)       Balance Overall balance assessment: No apparent balance deficits (not formally assessed)                                           Pertinent Vitals/Pain Pain Assessment: Faces Faces Pain Scale: Hurts little more Pain Location: sternum with coughing Pain Descriptors / Indicators: Aching;Grimacing Pain Intervention(s): Monitored during session;Other (comment)(offered sternal pillow)    Home Living Family/patient expects to be discharged to:: Private residence Living Arrangements: Alone Available Help at Discharge: Family;Available 24 hours/day Type of Home: House Home Access: Stairs to enter   [employer] of Steps: 4 Home Layout: Able to live on main level with bedroom/bathroom Home Equipment: Walker - 4 wheels Additional Comments: His sister will be coming in from out of town to stay with him indefinitely    Prior Function Level of Independence: Independent               Hand Dominance        Extremity/Trunk Assessment   Upper Extremity Assessment Upper Extremity Assessment: Overall WFL for tasks assessed    Lower Extremity Assessment Lower Extremity Assessment: Overall WFL for tasks assessed    Cervical / Trunk Assessment Cervical / Trunk Assessment: Kyphotic  Communication   Communication: No difficulties  Cognition Arousal/Alertness: Awake/alert Behavior During Therapy: WFL for tasks assessed/performed Overall Cognitive Status: Within Functional Limits  for tasks assessed                                 General Comments: Pt voicing being upset because his best friend died one year ago      General Comments      Exercises     Assessment/Plan    PT Assessment Patient needs continued PT services  PT Problem List Decreased activity  tolerance;Decreased mobility;Decreased knowledge of precautions       PT Treatment Interventions DME instruction;Gait training;Stair training;Functional mobility training;Therapeutic activities;Therapeutic exercise;Balance training;Patient/family education    PT Goals (Current goals can be found in the Care Plan section)  Acute Rehab PT Goals Patient Stated Goal: "get into and out of my shower." PT Goal Formulation: With patient Time For Goal Achievement: 04/18/18 Potential to Achieve Goals: Good    Frequency Min 3X/week   Barriers to discharge        Co-evaluation               AM-PAC PT "6 Clicks" Daily Activity  Outcome Measure Difficulty turning over in bed (including adjusting bedclothes, sheets and blankets)?: None Difficulty moving from lying on back to sitting on the side of the bed? : None Difficulty sitting down on and standing up from a chair with arms (e.g., wheelchair, bedside commode, etc,.)?: None Help needed moving to and from a bed to chair (including a wheelchair)?: None Help needed walking in hospital room?: A Little Help needed climbing 3-5 steps with a railing? : A Little 6 Click Score: 22    End of Session Equipment Utilized During Treatment: Oxygen Activity Tolerance: Patient tolerated treatment well Patient left: in chair;with call bell/phone within reach Nurse Communication: Mobility status PT Visit Diagnosis: Difficulty in walking, not elsewhere classified (R26.2);Pain Pain - part of body: (sternal)    Time: 3536-1443 PT Time Calculation (min) (ACUTE ONLY): 30 min   Charges:   PT Evaluation $PT Eval Moderate Complexity: 1 Mod PT Treatments $Therapeutic Activity: 8-22 mins       Ellamae Sia, PT, DPT Acute Rehabilitation Services Pager [phone] Office [phone]  Willy Eddy 04/04/2018, 1:30 PM

## 2018-04-04 NOTE — Progress Notes (Signed)
[facility] RN contacted unit RN, Hildred Alamin to ensure she was aware to dc central line after PICC is in place. Also informed Hildred Alamin this RN was not sure when the PICC team would be there to place PICC. She verbalized understanding.

## 2018-04-05 ENCOUNTER — Inpatient Hospital Stay (HOSPITAL_COMMUNITY): Payer: Medicare Other

## 2018-04-05 LAB — COMPREHENSIVE METABOLIC PANEL
ALT: 30 U/L (ref 0–44)
AST: 24 U/L (ref 15–41)
Albumin: 2.3 g/dL — ABNORMAL LOW (ref 3.5–5.0)
Alkaline Phosphatase: 146 U/L — ABNORMAL HIGH (ref 38–126)
Anion gap: 11 (ref 5–15)
BUN: 32 mg/dL — ABNORMAL HIGH (ref 8–23)
CO2: 25 mmol/L (ref 22–32)
Calcium: 8.4 mg/dL — ABNORMAL LOW (ref 8.9–10.3)
Chloride: 99 mmol/L (ref 98–111)
Creatinine, Ser: 1.57 mg/dL — ABNORMAL HIGH (ref 0.61–1.24)
GFR calc Af Amer: 51 mL/min — ABNORMAL LOW (ref >60)
GFR calc non Af Amer: 44 mL/min — ABNORMAL LOW (ref >60)
Glucose, Bld: 325 mg/dL — ABNORMAL HIGH (ref 70–99)
Potassium: 3.3 mmol/L — ABNORMAL LOW (ref 3.5–5.1)
Sodium: 135 mmol/L (ref 135–145)
Total Bilirubin: 1.1 mg/dL (ref 0.3–1.2)
Total Protein: 5.1 g/dL — ABNORMAL LOW (ref 6.5–8.1)

## 2018-04-05 LAB — GLUCOSE, CAPILLARY
Glucose-Capillary: 108 mg/dL — ABNORMAL HIGH (ref 70–99)
Glucose-Capillary: 112 mg/dL — ABNORMAL HIGH (ref 70–99)
Glucose-Capillary: 121 mg/dL — ABNORMAL HIGH (ref 70–99)
Glucose-Capillary: 127 mg/dL — ABNORMAL HIGH (ref 70–99)
Glucose-Capillary: 137 mg/dL — ABNORMAL HIGH (ref 70–99)
Glucose-Capillary: 171 mg/dL — ABNORMAL HIGH (ref 70–99)
Glucose-Capillary: 192 mg/dL — ABNORMAL HIGH (ref 70–99)

## 2018-04-05 LAB — CBC
HCT: 38.4 % — ABNORMAL LOW (ref 39.0–52.0)
Hemoglobin: 11.8 g/dL — ABNORMAL LOW (ref 13.0–17.0)
MCH: 30 pg (ref 26.0–34.0)
MCHC: 30.7 g/dL (ref 30.0–36.0)
MCV: 97.7 fL (ref 80.0–100.0)
Platelets: 106 10*3/uL — ABNORMAL LOW (ref 150–400)
RBC: 3.93 MIL/uL — ABNORMAL LOW (ref 4.22–5.81)
RDW: 13.7 % (ref 11.5–15.5)
WBC: 6.7 10*3/uL (ref 4.0–10.5)
nRBC: 0 % (ref 0.0–0.2)

## 2018-04-05 MED ORDER — AMIODARONE HCL IN DEXTROSE 360-4.14 MG/200ML-% IV SOLN
60.0000 mg/h | INTRAVENOUS | Status: AC
  Filled 2018-04-05: qty 200

## 2018-04-05 MED ORDER — AMIODARONE HCL IN DEXTROSE 360-4.14 MG/200ML-% IV SOLN: 30.0000 mg/h | INTRAVENOUS | Status: DC

## 2018-04-05 MED ORDER — AMIODARONE HCL 200 MG PO TABS
200.0000 mg | ORAL_TABLET | Freq: Two times a day (BID) | ORAL | Status: DC
  Administered 2018-04-05: 200 mg via ORAL
  Filled 2018-04-05: qty 1

## 2018-04-05 MED ORDER — AMIODARONE HCL 200 MG PO TABS: 200.0000 mg | ORAL_TABLET | Freq: Every day | ORAL | Status: DC

## 2018-04-05 MED ORDER — FUROSEMIDE 10 MG/ML IJ SOLN
40.0000 mg | Freq: Every day | INTRAMUSCULAR | Status: DC
  Administered 2018-04-06: 40 mg via INTRAVENOUS
  Filled 2018-04-05: qty 4

## 2018-04-05 NOTE — Progress Notes (Signed)
Patient ID: Nathan Medina, male   DOB: 1950-11-20, 67 y.o.   MRN: [id number] TCTS DAILY ICU PROGRESS NOTE                   [address]            [place] [address]          [phone]   4 Days Post-Op Procedure(s) (LRB): CORONARY ARTERY BYPASS GRAFTING (CABG) x Three, using left internal mammary artery and right leg greater saphenous vein harvested endoscopically (N/A) TRANSESOPHAGEAL ECHOCARDIOGRAM (TEE) (N/A)  Total Length of Stay:  LOS: 4 days   Subjective: Patient awake alert, neurologically intact feels better today  Objective: Vital signs in last 24 hours: Temp:  [97.6 F (36.4 C)-98.5 F (36.9 C)] 98.1 F (36.7 C) (11/16 0033) Pulse Rate:  [55-109] 55 (11/16 0550) Cardiac Rhythm: Normal sinus rhythm (11/16 0000) Resp:  [13-25] 19 (11/16 0550) BP: (117-166)/(67-123) 126/67 (11/16 0921) SpO2:  [67 %-100 %] 67 % (11/16 0839) Weight:  [92.6 kg] 92.6 kg (11/16 0550)  Filed Weights   04/03/18 0500 04/04/18 0700 04/05/18 0550  Weight: 92.1 kg 95.1 kg 92.6 kg    Weight change: -2.5 kg   Hemodynamic parameters for last 24 hours:    Intake/Output from previous day: 11/15 0701 - 11/16 0700 In: 814.5 [P.O.:240; I.V.:574.5] Out: 4132 [Urine:3830]  Intake/Output this shift: No intake/output data recorded.  Current Meds: Scheduled Meds: . acetaminophen  1,000 mg Oral Q6H   Or  . acetaminophen (TYLENOL) oral liquid 160 mg/5 mL  1,000 mg Per Tube Q6H  . albuterol  2.5 mg Nebulization TID  . allopurinol  300 mg Oral Daily  . aspirin EC  325 mg Oral Daily   Or  . aspirin  324 mg Per Tube Daily  . atorvastatin  10 mg Oral QHS  . bisacodyl  10 mg Oral Daily   Or  . bisacodyl  10 mg Rectal Daily  . Chlorhexidine Gluconate Cloth  6 each Topical Daily  . docusate sodium  200 mg Oral Daily  . furosemide  40 mg Intravenous BID  . insulin aspart  0-24 Units Subcutaneous Q4H  . insulin detemir  15 Units Subcutaneous Daily  . levothyroxine  175 mcg  Oral QAC breakfast  . metoprolol tartrate  12.5 mg Oral BID   Or  . metoprolol tartrate  12.5 mg Per Tube BID  . pantoprazole  40 mg Oral Daily  . potassium chloride  20 mEq Oral Daily  . sodium chloride flush  10-40 mL Intracatheter Q12H   Continuous Infusions: . sodium chloride    . amiodarone 30 mg/hr (04/05/18 0122)  . DOPamine 1.25 mcg/kg/min (04/04/18 1800)  . lactated ringers    . lactated ringers    . lactated ringers Stopped (04/04/18 1543)   PRN Meds:.ALPRAZolam, lactated ringers, metoprolol tartrate, midazolam, ondansetron (ZOFRAN) IV, oxyCODONE, sodium chloride flush, traMADol  General appearance: alert and cooperative Neurologic: intact Heart: regular rate and rhythm, S1, S2 normal, no murmur, click, rub or gallop Lungs: diminished breath sounds bibasilar Abdomen: soft, non-tender; bowel sounds normal; no masses,  no organomegaly Extremities: extremities normal, atraumatic, no cyanosis or edema and Homans sign is negative, no sign of DVT Wound: Sternum is stable and well-healed  Lab Results: CBC: Recent Labs    04/04/18 0558 04/05/18 0511  WBC 9.3 6.7  HGB 11.2* 11.8*  HCT 36.7* 38.4*  PLT 95* 106*   BMET:  Recent Labs  04/04/18 0558 04/05/18 0511  NA 139 135  K 3.7 3.3*  CL 106 99  CO2 26 25  GLUCOSE 134* 325*  BUN 36* 32*  CREATININE 1.75* 1.57*  CALCIUM 8.7* 8.4*    CMET: Lab Results  Component Value Date   WBC 6.7 04/05/2018   HGB 11.8 (L) 04/05/2018   HCT 38.4 (L) 04/05/2018   PLT 106 (L) 04/05/2018   GLUCOSE 325 (H) 04/05/2018   CHOL 190 04/01/2012   TRIG (H) 04/01/2012    525.0 Triglyceride is over 400; calculations on Lipids are invalid.   HDL 27.50 (L) 04/01/2012   LDLDIRECT 84.5 04/01/2012   LDLCALC 32 04/03/2010   ALT 30 04/05/2018   AST 24 04/05/2018   NA 135 04/05/2018   K 3.3 (L) 04/05/2018   CL 99 04/05/2018   CREATININE 1.57 (H) 04/05/2018   BUN 32 (H) 04/05/2018   CO2 25 04/05/2018   TSH 2.28 04/01/2012   PSA  3.39 04/01/2012   INR 1.55 04/01/2018   HGBA1C 6.2 (H) 02/14/2018   MICROALBUR 12.2 (H) 04/01/2012      PT/INR: No results for input(s): LABPROT, INR in the last 72 hours. Radiology: No results found.   Assessment/Plan: S/P Procedure(s) (LRB): CORONARY ARTERY BYPASS GRAFTING (CABG) x Three, using left internal mammary artery and right leg greater saphenous vein harvested endoscopically (N/A) TRANSESOPHAGEAL ECHOCARDIOGRAM (TEE) (N/A) Mobilize Diuresis Renal function stabilized at baseline Much better respiratory effort, now ambulating better Wean off and DC dopamine Holding sinus now convert to p.o. amiodarone  Grace Isaac 04/05/2018 9:38 AM

## 2018-04-05 NOTE — Progress Notes (Signed)
Patient ID: Nathan Medina, male   DOB: [birth year], 67 y.o.   MRN: [id number] EVENING ROUNDS NOTE :     [address]       [place] [address]             [phone]                 4 Days Post-Op Procedure(s) (LRB): CORONARY ARTERY BYPASS GRAFTING (CABG) x Three, using left internal mammary artery and right leg greater saphenous vein harvested endoscopically (N/A) TRANSESOPHAGEAL ECHOCARDIOGRAM (TEE) (N/A)  Total Length of Stay:  LOS: 4 days  BP 131/88   Pulse 80   Temp 97.9 F (36.6 C) (Oral)   Resp 13   Wt 92.6 kg   SpO2 99%   BMI 31.97 kg/m   .Intake/Output      11/16 0701 - 11/17 0700   P.O. 360   I.V. (mL/kg) 293.7 (3.2)   Total Intake(mL/kg) 653.7 (7.1)   Urine (mL/kg/hr) 575 (0.5)   Total Output 575   Net +78.7       Urine Occurrence 2 x     . sodium chloride    . amiodarone Stopped (04/05/18 1556)  . amiodarone    . lactated ringers    . lactated ringers    . lactated ringers Stopped (04/04/18 1543)     Lab Results  Component Value Date   WBC 6.7 04/05/2018   HGB 11.8 (L) 04/05/2018   HCT 38.4 (L) 04/05/2018   PLT 106 (L) 04/05/2018   GLUCOSE 325 (H) 04/05/2018   CHOL 190 04/01/2012   TRIG (H) 04/01/2012    525.0 Triglyceride is over 400; calculations on Lipids are invalid.   HDL 27.50 (L) 04/01/2012   LDLDIRECT 84.5 04/01/2012   LDLCALC 32 04/03/2010   ALT 30 04/05/2018   AST 24 04/05/2018   NA 135 04/05/2018   K 3.3 (L) 04/05/2018   CL 99 04/05/2018   CREATININE 1.57 (H) 04/05/2018   BUN 32 (H) 04/05/2018   CO2 25 04/05/2018   TSH 2.28 04/01/2012   PSA 3.39 04/01/2012   INR 1.55 04/01/2018   HGBA1C 6.2 (H) 02/14/2018   MICROALBUR 12.2 (H) 04/01/2012   While walking, hr dropped to 40 , now sinus 55 bp stable sinus , pacer not capturing  Will hold Cordarone and beta blocker for now  asymptomatic     Grace Isaac MD  Beeper [phone] Office [phone] 04/05/2018 7:42 PM

## 2018-04-05 NOTE — Plan of Care (Signed)
  Problem: Education: Goal: Knowledge of General Education information will improve Description Including pain rating scale, medication(s)/side effects and non-pharmacologic comfort measures Outcome: Progressing   Problem: Clinical Measurements: Goal: Ability to maintain clinical measurements within normal limits will improve Outcome: Progressing Goal: Will remain free from infection Outcome: Progressing Goal: Diagnostic test results will improve Outcome: Progressing Goal: Cardiovascular complication will be avoided Outcome: Progressing   Problem: Clinical Measurements: Goal: Postoperative complications will be avoided or minimized Outcome: Progressing

## 2018-04-06 LAB — CBC
HCT: 35.4 % — ABNORMAL LOW (ref 39.0–52.0)
Hemoglobin: 11.1 g/dL — ABNORMAL LOW (ref 13.0–17.0)
MCH: 30.3 pg (ref 26.0–34.0)
MCHC: 31.4 g/dL (ref 30.0–36.0)
MCV: 96.7 fL (ref 80.0–100.0)
Platelets: 133 10*3/uL — ABNORMAL LOW (ref 150–400)
RBC: 3.66 MIL/uL — ABNORMAL LOW (ref 4.22–5.81)
RDW: 13.6 % (ref 11.5–15.5)
WBC: 6 10*3/uL (ref 4.0–10.5)
nRBC: 0 % (ref 0.0–0.2)

## 2018-04-06 LAB — GLUCOSE, CAPILLARY
Glucose-Capillary: 114 mg/dL — ABNORMAL HIGH (ref 70–99)
Glucose-Capillary: 116 mg/dL — ABNORMAL HIGH (ref 70–99)
Glucose-Capillary: 120 mg/dL — ABNORMAL HIGH (ref 70–99)
Glucose-Capillary: 121 mg/dL — ABNORMAL HIGH (ref 70–99)
Glucose-Capillary: 164 mg/dL — ABNORMAL HIGH (ref 70–99)
Glucose-Capillary: 192 mg/dL — ABNORMAL HIGH (ref 70–99)

## 2018-04-06 LAB — BASIC METABOLIC PANEL
Anion gap: 8 (ref 5–15)
BUN: 37 mg/dL — ABNORMAL HIGH (ref 8–23)
CO2: 27 mmol/L (ref 22–32)
Calcium: 9 mg/dL (ref 8.9–10.3)
Chloride: 106 mmol/L (ref 98–111)
Creatinine, Ser: 1.78 mg/dL — ABNORMAL HIGH (ref 0.61–1.24)
GFR calc Af Amer: 44 mL/min — ABNORMAL LOW (ref >60)
GFR calc non Af Amer: 38 mL/min — ABNORMAL LOW (ref >60)
Glucose, Bld: 126 mg/dL — ABNORMAL HIGH (ref 70–99)
Potassium: 3.2 mmol/L — ABNORMAL LOW (ref 3.5–5.1)
Sodium: 141 mmol/L (ref 135–145)

## 2018-04-06 MED ORDER — POTASSIUM CHLORIDE CRYS ER 20 MEQ PO TBCR
20.0000 meq | EXTENDED_RELEASE_TABLET | ORAL | Status: DC | PRN
  Administered 2018-04-06: 20 meq via ORAL
  Filled 2018-04-06: qty 1

## 2018-04-06 MED ORDER — ENOXAPARIN SODIUM 30 MG/0.3ML ~~LOC~~ SOLN
30.0000 mg | SUBCUTANEOUS | Status: DC
  Administered 2018-04-06 – 2018-04-09 (×4): 30 mg via SUBCUTANEOUS
  Filled 2018-04-06 (×4): qty 0.3

## 2018-04-06 MED ORDER — POTASSIUM CHLORIDE CRYS ER 20 MEQ PO TBCR
20.0000 meq | EXTENDED_RELEASE_TABLET | ORAL | Status: AC
  Administered 2018-04-06 (×2): 20 meq via ORAL
  Filled 2018-04-06 (×2): qty 1

## 2018-04-06 NOTE — Progress Notes (Signed)
Patient ID: NECO KLING, male   DOB: 1950-06-14, 67 y.o.   MRN: [id number] TCTS DAILY ICU PROGRESS NOTE                   [address]            [place] [address]          [phone]   5 Days Post-Op Procedure(s) (LRB): CORONARY ARTERY BYPASS GRAFTING (CABG) x Three, using left internal mammary artery and right leg greater saphenous vein harvested endoscopically (N/A) TRANSESOPHAGEAL ECHOCARDIOGRAM (TEE) (N/A)  Total Length of Stay:  LOS: 5 days   Subjective: Patient awake alert neurologically intact notes he feels better than last night, walked twice around the unit today, with ambulation heart rate will go into the 60s  Objective: Vital signs in last 24 hours: Temp:  [97.6 F (36.4 C)-98.6 F (37 C)] 98.5 F (36.9 C) (11/17 0839) Pulse Rate:  [31-84] 53 (11/17 0700) Cardiac Rhythm: Sinus bradycardia (11/17 0700) Resp:  [13-24] 13 (11/16 1600) BP: (109-158)/(59-88) 138/77 (11/17 0700) SpO2:  [89 %-100 %] 98 % (11/17 0839) Weight:  [91.9 kg] 91.9 kg (11/17 0500)  Filed Weights   04/04/18 0700 04/05/18 0550 04/06/18 0500  Weight: 95.1 kg 92.6 kg 91.9 kg    Weight change: -0.701 kg   Hemodynamic parameters for last 24 hours:    Intake/Output from previous day: 11/16 0701 - 11/17 0700 In: 903.7 [P.O.:600; I.V.:303.7] Out: 850 [Urine:850]  Intake/Output this shift: No intake/output data recorded.  Current Meds: Scheduled Meds: . acetaminophen  1,000 mg Oral Q6H   Or  . acetaminophen (TYLENOL) oral liquid 160 mg/5 mL  1,000 mg Per Tube Q6H  . albuterol  2.5 mg Nebulization TID  . allopurinol  300 mg Oral Daily  . aspirin EC  325 mg Oral Daily   Or  . aspirin  324 mg Per Tube Daily  . atorvastatin  10 mg Oral QHS  . bisacodyl  10 mg Oral Daily   Or  . bisacodyl  10 mg Rectal Daily  . Chlorhexidine Gluconate Cloth  6 each Topical Daily  . docusate sodium  200 mg Oral Daily  . furosemide  40 mg Intravenous Daily  . insulin aspart  0-24  Units Subcutaneous Q4H  . insulin detemir  15 Units Subcutaneous Daily  . levothyroxine  175 mcg Oral QAC breakfast  . pantoprazole  40 mg Oral Daily  . potassium chloride  20 mEq Oral Daily  . potassium chloride  20 mEq Oral Q4H  . sodium chloride flush  10-40 mL Intracatheter Q12H   Continuous Infusions: . sodium chloride    . amiodarone Stopped (04/05/18 1556)  . amiodarone    . lactated ringers    . lactated ringers    . lactated ringers Stopped (04/04/18 1543)   PRN Meds:.ALPRAZolam, lactated ringers, metoprolol tartrate, midazolam, ondansetron (ZOFRAN) IV, oxyCODONE, sodium chloride flush, traMADol  General appearance: alert, cooperative and no distress Neurologic: intact Heart: Heart rate in the 50s, second-degree heart block Lungs: clear to auscultation bilaterally Abdomen: soft, non-tender; bowel sounds normal; no masses,  no organomegaly Extremities: extremities normal, atraumatic, no cyanosis or edema and Homans sign is negative, no sign of DVT Wound: Sternum stable  Lab Results: CBC: Recent Labs    04/05/18 0511 04/06/18 0300  WBC 6.7 6.0  HGB 11.8* 11.1*  HCT 38.4* 35.4*  PLT 106* 133*   BMET:  Recent Labs    04/05/18 0511 04/06/18  0300  NA 135 141  K 3.3* 3.2*  CL 99 106  CO2 25 27  GLUCOSE 325* 126*  BUN 32* 37*  CREATININE 1.57* 1.78*  CALCIUM 8.4* 9.0    CMET: Lab Results  Component Value Date   WBC 6.0 04/06/2018   HGB 11.1 (L) 04/06/2018   HCT 35.4 (L) 04/06/2018   PLT 133 (L) 04/06/2018   GLUCOSE 126 (H) 04/06/2018   CHOL 190 04/01/2012   TRIG (H) 04/01/2012    525.0 Triglyceride is over 400; calculations on Lipids are invalid.   HDL 27.50 (L) 04/01/2012   LDLDIRECT 84.5 04/01/2012   LDLCALC 32 04/03/2010   ALT 30 04/05/2018   AST 24 04/05/2018   NA 141 04/06/2018   K 3.2 (L) 04/06/2018   CL 106 04/06/2018   CREATININE 1.78 (H) 04/06/2018   BUN 37 (H) 04/06/2018   CO2 27 04/06/2018   TSH 2.28 04/01/2012   PSA 3.39  04/01/2012   INR 1.55 04/01/2018   HGBA1C 6.2 (H) 02/14/2018   MICROALBUR 12.2 (H) 04/01/2012      PT/INR: No results for input(s): LABPROT, INR in the last 72 hours. Radiology: No results found.   Assessment/Plan: S/P Procedure(s) (LRB): CORONARY ARTERY BYPASS GRAFTING (CABG) x Three, using left internal mammary artery and right leg greater saphenous vein harvested endoscopically (N/A) TRANSESOPHAGEAL ECHOCARDIOGRAM (TEE) (N/A) Mobilize Renal function close to baseline 1.7 today Holding amiodarone and beta-blocker Replacing potassium   Grace Isaac 04/06/2018 9:49 AM Patient worked just take the wires and wrap them up as he had

## 2018-04-06 NOTE — Evaluation (Signed)
Occupational Therapy Evaluation Patient Details Name: Nathan Medina MRN: [id number] DOB: Aug 31, 1950 Today's Date: 04/06/2018    History of Present Illness Pt is a 67 y.o. 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 Recent PET scan showed low uptake subscapsular lesion in left kidney which is highly suspicious for renal cell carcinoma.    Clinical Impression   PTA Pt indpendent in ADL/IADL and mobility. Pt is currently overall min A to min guard for ADL. Education on sternal precautions with emphasis on compensatory strategies for maintaining sternal precautions. Pt will benefit from skilled OT in the acute setting to maximize safety and independence in ADL functional transfers.  practice tub vs shower transfer as well as introduce potential AE for sternal precautions.    Follow Up Recommendations  Supervision/Assistance - 24 hour;No OT follow up    Equipment Recommendations  Tub/shower seat(NOT a 3 in 1 - it needs to fit in tub because he has doors)    Recommendations for Other Services       Precautions / Restrictions Precautions Precautions: Sternal Precaution Booklet Issued: Yes (comment) Precaution Comments: Temp pacemaker. Verbally reviewed and provided written handout Restrictions Weight Bearing Restrictions: Yes Other Position/Activity Restrictions: sternal precautions      Mobility Bed Mobility               General bed mobility comments: OOB in chair  Transfers Overall transfer level: Modified independent               General transfer comment: Good use of lower extremities to power up to standing and good hands on knee placement    Balance Overall balance assessment: No apparent balance deficits (not formally assessed)                                         ADL either performed or assessed with clinical judgement   ADL Overall  ADL's : Needs assistance/impaired Eating/Feeding: Modified independent;Sitting   Grooming: Set up;Standing;Cueing for compensatory techniques Grooming Details (indicate cue type and reason): educated in compensatory strategies for grooming and maintaining cervical precautions Upper Body Bathing: Minimal assistance;With caregiver independent assisting;Sitting Upper Body Bathing Details (indicate cue type and reason): educated in long handle sponge  Lower Body Bathing: Set up;Sitting/lateral leans Lower Body Bathing Details (indicate cue type and reason): educated in long handle sponge Upper Body Dressing : Minimal assistance;With caregiver independent assisting;Sitting Upper Body Dressing Details (indicate cue type and reason): edcucated in smart clothing choices to maintain cervical precautions Lower Body Dressing: Min guard;Sit to/from stand   Toilet Transfer: Supervision/safety;RW   Toileting- Water quality scientist and Hygiene: Min guard;Sit to/from stand Toileting - Clothing Manipulation Details (indicate cue type and reason): able to access peri care from front Tub/ Shower Transfer: Walk-in shower;Tub transfer;Minimal assistance;Ambulation Tub/Shower Transfer Details (indicate cue type and reason): will need to practice tub transfer - has walk in shower but it is TINY  Functional mobility during ADLs: Supervision/safety;Rolling walker General ADL Comments: educated in sternal precautions and compensatory strategies. Discussed shwer/tub transfer a lot - but needs to practice     Vision Baseline Vision/History: Wears glasses Wears Glasses: At all times Patient Visual Report: No change from baseline       Perception     Praxis      Pertinent Vitals/Pain Pain Assessment:  0-10 Pain Score: 6  Pain Location: Right side of chest Pain Descriptors / Indicators: Aching;Grimacing;Constant     Hand Dominance Right   Extremity/Trunk Assessment Upper Extremity Assessment Upper  Extremity Assessment: Overall WFL for tasks assessed   Lower Extremity Assessment Lower Extremity Assessment: Defer to PT evaluation   Cervical / Trunk Assessment Cervical / Trunk Assessment: Kyphotic   Communication Communication Communication: No difficulties   Cognition Arousal/Alertness: Awake/alert Behavior During Therapy: WFL for tasks assessed/performed Overall Cognitive Status: Within Functional Limits for tasks assessed                                     General Comments  sister present at end of session today    Exercises     Shoulder Instructions      Home Living Family/patient expects to be discharged to:: Private residence Living Arrangements: Alone(sister is staying with him for a few weeks) Available Help at Discharge: Family;Available 24 hours/day(sister) Type of Home: House Home Access: Stairs to enter Entrance Stairs-Number of Steps: 4   Home Layout: Able to live on main level with bedroom/bathroom     Bathroom Shower/Tub: Walk-in shower;Tub/shower unit;Door   Bathroom Toilet: Handicapped height     Home Equipment: Environmental consultant - 4 wheels;Grab bars - tub/shower;Hand held shower head   Additional Comments: His sister will be coming in from out of town to stay with him indefinitely      Prior Functioning/Environment Level of Independence: Independent                 OT Problem List: Decreased range of motion;Decreased activity tolerance;Impaired balance (sitting and/or standing);Decreased knowledge of use of DME or AE;Decreased knowledge of precautions;Cardiopulmonary status limiting activity;Pain      OT Treatment/Interventions: Self-care/ADL training;DME and/or AE instruction;Energy conservation;Therapeutic activities;Patient/family education;Balance training    OT Goals(Current goals can be found in the care plan section) Acute Rehab OT Goals Patient Stated Goal: "get into and out of my shower." OT Goal Formulation: With  patient/family Time For Goal Achievement: 04/20/18 Potential to Achieve Goals: Good ADL Goals Pt Will Perform Grooming: with modified independence;standing Pt Will Perform Upper Body Dressing: with supervision;sitting Pt Will Perform Lower Body Dressing: with modified independence;sit to/from stand Pt Will Perform Tub/Shower Transfer: with supervision;ambulating;Tub transfer;Shower transfer;shower seat  OT Frequency: Min 2X/week   Barriers to D/C:            Co-evaluation              AM-PAC PT "6 Clicks" Daily Activity     Outcome Measure Help from another person eating meals?: None Help from another person taking care of personal grooming?: A Little Help from another person toileting, which includes using toliet, bedpan, or urinal?: A Little Help from another person bathing (including washing, rinsing, drying)?: A Little Help from another person to put on and taking off regular upper body clothing?: A Little Help from another person to put on and taking off regular lower body clothing?: A Little 6 Click Score: 19   End of Session Equipment Utilized During Treatment: Gait belt;Rolling walker Nurse Communication: Mobility status;Precautions  Activity Tolerance: Patient tolerated treatment well Patient left: in chair;with call bell/phone within reach;with nursing/sitter in room;with family/visitor present  OT Visit Diagnosis: Other abnormalities of gait and mobility (R26.89);Pain Pain - Right/Left: Right Pain - part of body: (trunk/side)  Time: 8978-4784 OT Time Calculation (min): 24 min Charges:  OT General Charges $OT Visit: 1 Visit OT Evaluation $OT Eval Moderate Complexity: 1 Mod OT Treatments $Self Care/Home Management : 8-22 mins  Hulda Humphrey OTR/L Acute Rehabilitation Services Pager: [phone] Office: [place] 04/06/2018, 11:00 AM

## 2018-04-06 NOTE — Progress Notes (Signed)
Patient would like to take nebulizer treatment however, he requests to take it without the metaneb.  He states it gets old quick.

## 2018-04-06 NOTE — Plan of Care (Signed)
  Problem: Education: Goal: Knowledge of General Education information will improve Description Including pain rating scale, medication(s)/side effects and non-pharmacologic comfort measures Outcome: Progressing   Problem: Health Behavior/Discharge Planning: Goal: Ability to manage health-related needs will improve Outcome: Progressing   Problem: Clinical Measurements: Goal: Will remain free from infection Outcome: Progressing   Problem: Activity: Goal: Risk for activity intolerance will decrease Outcome: Progressing   Problem: Elimination: Goal: Will not experience complications related to bowel motility Outcome: Progressing Goal: Will not experience complications related to urinary retention Outcome: Progressing   Problem: Pain Managment: Goal: General experience of comfort will improve Outcome: Progressing   Problem: Safety: Goal: Ability to remain free from injury will improve Outcome: Progressing   Problem: Activity: Goal: Risk for activity intolerance will decrease Outcome: Progressing   Problem: Cardiac: Goal: Will achieve and/or maintain hemodynamic stability Outcome: Progressing   Problem: Clinical Measurements: Goal: Postoperative complications will be avoided or minimized Outcome: Progressing   Problem: Respiratory: Goal: Respiratory status will improve Outcome: Progressing   Problem: Skin Integrity: Goal: Wound healing without signs and symptoms of infection Outcome: Progressing Goal: Risk for impaired skin integrity will decrease Outcome: Progressing   Problem: Urinary Elimination: Goal: Ability to achieve and maintain adequate renal perfusion and functioning will improve Outcome: Progressing   Problem: Clinical Measurements: Goal: Ability to maintain clinical measurements within normal limits will improve Outcome: Not Progressing  Patient noted with bradycardia - HR 30s-50s.  Goal: Cardiovascular complication will be avoided Outcome: Not  Progressing  Patient noted with bradycardia - HR 30s-50s.

## 2018-04-06 NOTE — Progress Notes (Signed)
Patient ID: Nathan Medina, male   DOB: 06/28/1950, 67 y.o.   MRN: [id number] EVENING ROUNDS NOTE :     [address]       [place] [address]             [phone]                 5 Days Post-Op Procedure(s) (LRB): CORONARY ARTERY BYPASS GRAFTING (CABG) x Three, using left internal mammary artery and right leg greater saphenous vein harvested endoscopically (N/A) TRANSESOPHAGEAL ECHOCARDIOGRAM (TEE) (N/A)  Total Length of Stay:  LOS: 5 days  BP 118/68   Pulse (!) 36   Temp 98.3 F (36.8 C) (Oral)   Resp 13   Wt 91.9 kg   SpO2 100%   BMI 31.73 kg/m   .Intake/Output      11/16 0701 - 11/17 0700 11/17 0701 - 11/18 0700   P.O. 600 720   I.V. (mL/kg) 303.7 (3.3)    Total Intake(mL/kg) 903.7 (9.8) 720 (7.8)   Urine (mL/kg/hr) 850 (0.4) 925 (0.9)   Stool 0    Total Output 850 925   Net +53.7 -205        Urine Occurrence 2 x    Stool Occurrence 2 x      . sodium chloride    . lactated ringers    . lactated ringers    . lactated ringers Stopped (04/04/18 1543)     Lab Results  Component Value Date   WBC 6.0 04/06/2018   HGB 11.1 (L) 04/06/2018   HCT 35.4 (L) 04/06/2018   PLT 133 (L) 04/06/2018   GLUCOSE 126 (H) 04/06/2018   CHOL 190 04/01/2012   TRIG (H) 04/01/2012    525.0 Triglyceride is over 400; calculations on Lipids are invalid.   HDL 27.50 (L) 04/01/2012   LDLDIRECT 84.5 04/01/2012   LDLCALC 32 04/03/2010   ALT 30 04/05/2018   AST 24 04/05/2018   NA 141 04/06/2018   K 3.2 (L) 04/06/2018   CL 106 04/06/2018   CREATININE 1.78 (H) 04/06/2018   BUN 37 (H) 04/06/2018   CO2 27 04/06/2018   TSH 2.28 04/01/2012   PSA 3.39 04/01/2012   INR 1.55 04/01/2018   HGBA1C 6.2 (H) 02/14/2018   MICROALBUR 12.2 (H) 04/01/2012   Hr sinus 60 Walked 3 times around unit  Grace Isaac MD  Beeper [phone] Office [phone] 04/06/2018 6:46 PM

## 2018-04-07 LAB — GLUCOSE, CAPILLARY
Glucose-Capillary: 127 mg/dL — ABNORMAL HIGH (ref 70–99)
Glucose-Capillary: 128 mg/dL — ABNORMAL HIGH (ref 70–99)
Glucose-Capillary: 134 mg/dL — ABNORMAL HIGH (ref 70–99)
Glucose-Capillary: 139 mg/dL — ABNORMAL HIGH (ref 70–99)
Glucose-Capillary: 157 mg/dL — ABNORMAL HIGH (ref 70–99)
Glucose-Capillary: 208 mg/dL — ABNORMAL HIGH (ref 70–99)

## 2018-04-07 LAB — CBC
HCT: 35.5 % — ABNORMAL LOW (ref 39.0–52.0)
Hemoglobin: 11 g/dL — ABNORMAL LOW (ref 13.0–17.0)
MCH: 29.8 pg (ref 26.0–34.0)
MCHC: 31 g/dL (ref 30.0–36.0)
MCV: 96.2 fL (ref 80.0–100.0)
Platelets: 145 10*3/uL — ABNORMAL LOW (ref 150–400)
RBC: 3.69 MIL/uL — ABNORMAL LOW (ref 4.22–5.81)
RDW: 13.4 % (ref 11.5–15.5)
WBC: 5.7 10*3/uL (ref 4.0–10.5)
nRBC: 0 % (ref 0.0–0.2)

## 2018-04-07 LAB — BASIC METABOLIC PANEL
Anion gap: 6 (ref 5–15)
BUN: 33 mg/dL — ABNORMAL HIGH (ref 8–23)
CO2: 27 mmol/L (ref 22–32)
Calcium: 9.1 mg/dL (ref 8.9–10.3)
Chloride: 109 mmol/L (ref 98–111)
Creatinine, Ser: 1.64 mg/dL — ABNORMAL HIGH (ref 0.61–1.24)
GFR calc Af Amer: 48 mL/min — ABNORMAL LOW (ref >60)
GFR calc non Af Amer: 42 mL/min — ABNORMAL LOW (ref >60)
Glucose, Bld: 138 mg/dL — ABNORMAL HIGH (ref 70–99)
Potassium: 3.7 mmol/L (ref 3.5–5.1)
Sodium: 142 mmol/L (ref 135–145)

## 2018-04-07 LAB — MAGNESIUM: Magnesium: 1.9 mg/dL (ref 1.7–2.4)

## 2018-04-07 MED ORDER — ALBUTEROL SULFATE (2.5 MG/3ML) 0.083% IN NEBU: 2.5000 mg | INHALATION_SOLUTION | RESPIRATORY_TRACT | Status: DC | PRN

## 2018-04-07 MED ORDER — INSULIN ASPART 100 UNIT/ML ~~LOC~~ SOLN
0.0000 [IU] | Freq: Three times a day (TID) | SUBCUTANEOUS | Status: DC
  Administered 2018-04-07: 8 [IU] via SUBCUTANEOUS
  Administered 2018-04-07 – 2018-04-09 (×7): 2 [IU] via SUBCUTANEOUS

## 2018-04-07 MED ORDER — SODIUM CHLORIDE 0.9 % IV SOLN: 250.0000 mL | INTRAVENOUS | Status: DC | PRN

## 2018-04-07 MED ORDER — SODIUM CHLORIDE 0.9% FLUSH
3.0000 mL | Freq: Two times a day (BID) | INTRAVENOUS | Status: DC
  Administered 2018-04-08 – 2018-04-09 (×3): 3 mL via INTRAVENOUS

## 2018-04-07 MED ORDER — SODIUM CHLORIDE 0.9% FLUSH: 3.0000 mL | INTRAVENOUS | Status: DC | PRN

## 2018-04-07 MED ORDER — MOVING RIGHT ALONG BOOK
Freq: Once | Status: AC
  Administered 2018-04-07: 14:00:00
  Filled 2018-04-07: qty 1

## 2018-04-07 NOTE — Progress Notes (Addendum)
TCTS DAILY ICU PROGRESS NOTE                   [address]            [place] [address]          [phone]   6 Days Post-Op Procedure(s) (LRB): CORONARY ARTERY BYPASS GRAFTING (CABG) x Three, using left internal mammary artery and right leg greater saphenous vein harvested endoscopically (N/A) TRANSESOPHAGEAL ECHOCARDIOGRAM (TEE) (N/A)  Total Length of Stay:  LOS: 6 days   Subjective:  Patient feeling pretty good.  He denies specific complaints.  + ambulation around the ICU  + BM  Objective: Vital signs in last 24 hours: Temp:  [97.5 F (36.4 C)-99.1 F (37.3 C)] 98.2 F (36.8 C) (11/18 0800) Pulse Rate:  [36-64] 62 (11/18 1000) Cardiac Rhythm: Sinus bradycardia (11/18 0400) BP: (103-144)/(63-86) 116/66 (11/18 1000) SpO2:  [91 %-100 %] 96 % (11/18 1000) Weight:  [92.2 kg] 92.2 kg (11/18 0456)  Filed Weights   04/05/18 0550 04/06/18 0500 04/07/18 0456  Weight: 92.6 kg 91.9 kg 92.2 kg    Weight change: 0.301 kg   Intake/Output from previous day: 11/17 0701 - 11/18 0700 In: 740 [P.O.:720; I.V.:20] Out: 1376 [Urine:1375; Stool:1]  Intake/Output this shift: Total I/O In: 480 [P.O.:480] Out: -   Current Meds: Scheduled Meds: . allopurinol  300 mg Oral Daily  . aspirin EC  325 mg Oral Daily   Or  . aspirin  324 mg Per Tube Daily  . atorvastatin  10 mg Oral QHS  . bisacodyl  10 mg Oral Daily   Or  . bisacodyl  10 mg Rectal Daily  . Chlorhexidine Gluconate Cloth  6 each Topical Daily  . docusate sodium  200 mg Oral Daily  . enoxaparin (LOVENOX) injection  30 mg Subcutaneous Q24H  . insulin aspart  0-24 Units Subcutaneous TID PC & HS  . insulin detemir  15 Units Subcutaneous Daily  . levothyroxine  175 mcg Oral QAC breakfast  . moving right along book   Does not apply Once  . pantoprazole  40 mg Oral Daily  . potassium chloride  20 mEq Oral Daily  . sodium chloride flush  10-40 mL Intracatheter Q12H  . sodium chloride flush  3 mL  Intravenous Q12H   Continuous Infusions: . sodium chloride    . sodium chloride    . lactated ringers Stopped (04/04/18 1543)   PRN Meds:.sodium chloride, albuterol, ALPRAZolam, midazolam, ondansetron (ZOFRAN) IV, oxyCODONE, sodium chloride flush, sodium chloride flush, traMADol  General appearance: alert, cooperative and no distress Heart: regular rate and rhythm Lungs: clear to auscultation bilaterally Abdomen: soft, non-tender; bowel sounds normal; no masses,  no organomegaly Extremities: edema trace Wound: clean and dry  Lab Results: CBC: Recent Labs    04/06/18 0300 04/07/18 0445  WBC 6.0 5.7  HGB 11.1* 11.0*  HCT 35.4* 35.5*  PLT 133* 145*   BMET:  Recent Labs    04/06/18 0300 04/07/18 0445  NA 141 142  K 3.2* 3.7  CL 106 109  CO2 27 27  GLUCOSE 126* 138*  BUN 37* 33*  CREATININE 1.78* 1.64*  CALCIUM 9.0 9.1    CMET: Lab Results  Component Value Date   WBC 5.7 04/07/2018   HGB 11.0 (L) 04/07/2018   HCT 35.5 (L) 04/07/2018   PLT 145 (L) 04/07/2018   GLUCOSE 138 (H) 04/07/2018   CHOL 190 04/01/2012   TRIG (H) 04/01/2012  525.0 Triglyceride is over 400; calculations on Lipids are invalid.   HDL 27.50 (L) 04/01/2012   LDLDIRECT 84.5 04/01/2012   LDLCALC 32 04/03/2010   ALT 30 04/05/2018   AST 24 04/05/2018   NA 142 04/07/2018   K 3.7 04/07/2018   CL 109 04/07/2018   CREATININE 1.64 (H) 04/07/2018   BUN 33 (H) 04/07/2018   CO2 27 04/07/2018   TSH 2.28 04/01/2012   PSA 3.39 04/01/2012   INR 1.55 04/01/2018   HGBA1C 6.2 (H) 02/14/2018   MICROALBUR 12.2 (H) 04/01/2012      PT/INR: No results for input(s): LABPROT, INR in the last 72 hours. Radiology: No results found.   Assessment/Plan: S/P Procedure(s) (LRB): CORONARY ARTERY BYPASS GRAFTING (CABG) x Three, using left internal mammary artery and right leg greater saphenous vein harvested endoscopically (N/A) TRANSESOPHAGEAL ECHOCARDIOGRAM (TEE) (N/A)  1. CV- Previous A. Fib, Sinus  Bradycardia- Lopressor and Amiodarone have been discontinued 2. Pulm- no acute issues, good use of IS 3. Renal- creatinine stable at 1.65, weight is below baseline, no Lasix at this time 4. DM- sugars controlled, continue current insulin regimen, change SSIP/CBGs to ac/hs 5. Dispo- patient stable, sinus brady no amiodarone or Lopressor at this time, will transfer to [place] 04/07/2018 11:44 AM  Transfer to step down, holding sinus 62 now off beta  blocker and Cordarone  Cr at baseline 1.64  I have seen and examined [place] Neighbor and agree with the above assessment  and plan.  Grace Isaac MD [facility] [phone] Office [phone] 04/07/2018 1:58 PM

## 2018-04-07 NOTE — Progress Notes (Signed)
Occupational Therapy Treatment Patient Details Name: Nathan Medina MRN: [id number] DOB: [birth year] Today's Date: 04/07/2018    History of present illness Pt is a 67 y.o. 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 Recent PET scan showed low uptake subscapsular lesion in left kidney which is highly suspicious for renal cell carcinoma.    OT comments  Pt progressing towards established OT goals. Pt able to recall sternal precautions. Providing education on safe shower transfer and use of shower chair. Pt performing with Min Guard A for safety. Providing education on compensatory techniques for UB/LB dressing; pt donning shoes at EOB with Min Guard A for safety in standing. Update dc recommend to home with HHOT and will continue to follow acutely as admitted.    Follow Up Recommendations  Home health OT;Supervision/Assistance - 24 hour    Equipment Recommendations  Tub/shower seat(NOT a 3 in 1 - it needs to fit in tub because he has doors)    Recommendations for Other Services      Precautions / Restrictions Precautions Precautions: Sternal Precaution Booklet Issued: Yes (comment) Precaution Comments: Temp pacemaker. Verbally reviewed and provided written handout. Pt verbalized understanding of sternal precautions Restrictions Weight Bearing Restrictions: Yes(sternal precautions) Other Position/Activity Restrictions: sternal precautions       Mobility Bed Mobility Overal bed mobility: Needs Assistance Bed Mobility: Rolling;Sidelying to Sit Rolling: Supervision Sidelying to sit: Supervision       General bed mobility comments: Cues for sternal precautions and performance of log roll  Transfers Overall transfer level: Needs assistance   Transfers: Sit to/from Stand Sit to Stand: Min guard         General transfer comment: Min Guard A for safety    Balance Overall  balance assessment: No apparent balance deficits (not formally assessed)                                         ADL either performed or assessed with clinical judgement   ADL Overall ADL's : Needs assistance/impaired                 Upper Body Dressing : Minimal assistance;Standing Upper Body Dressing Details (indicate cue type and reason): Educating pt on compensatory techniques for donning UB clothing and maintaining sternal precautions. Min A to don second gown like jacket Lower Body Dressing: Min guard;Sit to/from stand Lower Body Dressing Details (indicate cue type and reason): Educating pt on LB dressing techniques for adherance to sternal precautions. Pt donning slippers by bringing his ankles to his knees at EOB. Min Guard A for safety in standing         Tub/ Banker: Gaffer;Ambulation;Min guard Tub/Shower Transfer Details (indicate cue type and reason): Pt performing shower transfer with Min Guard A for safety. Demonstrating understanding. COntinues to reports he will have difficulty placing shower seat in shower due to space Functional mobility during ADLs: Supervision/safety;Rolling walker General ADL Comments: Educated in sternal precautions and compensatory strategies. Pt practicing shower transfer and LB dressing     Vision       Perception     Praxis      Cognition Arousal/Alertness: Awake/alert Behavior During Therapy: WFL for tasks assessed/performed Overall Cognitive Status: Within Functional Limits for tasks assessed  Exercises     Shoulder Instructions       General Comments VSS. SpO2 90s on RA    Pertinent Vitals/ Pain       Pain Assessment: Faces Faces Pain Scale: Hurts little more Pain Location: Right side of chest Pain Descriptors / Indicators: Aching;Grimacing;Constant Pain Intervention(s): Monitored during session;Limited activity within  patient's tolerance;Repositioned  Home Living                                          Prior Functioning/Environment              Frequency  Min 2X/week        Progress Toward Goals  OT Goals(current goals can now be found in the care plan section)  Progress towards OT goals: Progressing toward goals  Acute Rehab OT Goals Patient Stated Goal: "get into and out of my shower." OT Goal Formulation: With patient/family Time For Goal Achievement: 04/20/18 Potential to Achieve Goals: Good ADL Goals Pt Will Perform Grooming: with modified independence;standing Pt Will Perform Upper Body Dressing: with supervision;sitting Pt Will Perform Lower Body Dressing: with modified independence;sit to/from stand Pt Will Perform Tub/Shower Transfer: with supervision;ambulating;Tub transfer;Shower transfer;shower seat  Plan      Co-evaluation                 AM-PAC PT "6 Clicks" Daily Activity     Outcome Measure   Help from another person eating meals?: None Help from another person taking care of personal grooming?: A Little Help from another person toileting, which includes using toliet, bedpan, or urinal?: A Little Help from another person bathing (including washing, rinsing, drying)?: A Little Help from another person to put on and taking off regular upper body clothing?: A Little Help from another person to put on and taking off regular lower body clothing?: A Little 6 Click Score: 19    End of Session Equipment Utilized During Treatment: Gait belt;Rolling walker  OT Visit Diagnosis: Other abnormalities of gait and mobility (R26.89);Pain Pain - Right/Left: Right Pain - part of body: (trunk/side)   Activity Tolerance Patient tolerated treatment well   Patient Left in chair;with call bell/phone within reach;with nursing/sitter in room;with family/visitor present   Nurse Communication Mobility status;Precautions        Time: 0981-1914 OT Time  Calculation (min): 17 min  Charges: OT General Charges $OT Visit: 1 Visit OT Treatments $Self Care/Home Management : 8-22 mins  [place], OTR/L Acute Rehab Pager: [phone] Office: [place] 04/07/2018, 2:42 PM

## 2018-04-07 NOTE — Progress Notes (Signed)
TCTS BRIEF SICU PROGRESS NOTE  6 Days Post-Op  S/P Procedure(s) (LRB): CORONARY ARTERY BYPASS GRAFTING (CABG) x Three, using left internal mammary artery and right leg greater saphenous vein harvested endoscopically (N/A) TRANSESOPHAGEAL ECHOCARDIOGRAM (TEE) (N/A)   Stable day.  Maintaining NSR  Plan: Continue current plan  Rexene Alberts, MD 04/07/2018 6:50 PM

## 2018-04-07 NOTE — Care Management Note (Addendum)
Case Management Note  Patient Details  Name: Nathan Medina MRN: [id number] Date of Birth: 12/15/1950  Subjective/Objective:  66yo male s/p CABG.                 Action/Plan: CM met with patient/sister to discuss dispositional needs. Patient lived at home alone, independent with ADLs PTA. Patient reports having a rollator, (2) canes and transport chair available for use. PCP verified as: Dr. Walter Pharr; pharmacy of choice: [facility], [address] CM discussed PT/OT recommendations, with patient requesting HHPT/OT post transition for a safety eval. HH preference provided, with [facility] (formerly [facility] selected. HH referral given to Tiffany RN, [facility] liaison; AVS updated. Patient will need HHPT/OT orders and F2F. Patient questioning information on [employer], with questions answered. Patients insurance doesn't cover tub benches, with alternatives discussed, with patient to order from [employer] or a local pharmacy. Patients sister remain with patient during his recovery and provide transportation home. CM team will continue to follow.   Expected Discharge Date:  04/09/18               Expected Discharge Plan:  Home w Home Health Services  In-House Referral:  NA  Discharge planning Services  CM Consult  Post Acute Care Choice:  Home Health Choice offered to:  Patient  DME Arranged:  N/A DME Agency:  NA  HH Arranged:  PT, OT HH Agency:  [facility] (now [facility])  Status of Service:  In process, will continue to follow  If discussed at Long Length of Stay Meetings, dates discussed:    Additional Comments:    RN, BSN, NCM-BC, ACM-RN [phone] 04/07/2018, 1:01 PM  

## 2018-04-07 NOTE — Progress Notes (Signed)
Physical Therapy Treatment Patient Details Name: Nathan Medina MRN: [id number] DOB: [birth year] Today's Date: 04/07/2018    History of Present Illness Pt is a 67 y.o. 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 Recent PET scan showed low uptake subscapsular lesion in left kidney which is highly suspicious for renal cell carcinoma.     PT Comments    Patient mobilizing well, tolerated multiple laps of hallway ambulation. Continues to require assist for bed mobility but overall improving. Patient with some safety concerns UM:PNTI and requesting safety assessment from PT in the home. Agree with this request, discharge recommendations updated.    Follow Up Recommendations  Home health PT     Equipment Recommendations  None recommended by PT    Recommendations for Other Services       Precautions / Restrictions Precautions Precautions: Sternal Precaution Booklet Issued: Yes (comment) Precaution Comments: Temp pacemaker. Verbally reviewed and provided written handout. Pt verbalized understanding of sternal precautions Restrictions Weight Bearing Restrictions: Yes(sternal precautions) Other Position/Activity Restrictions: sternal precautions    Mobility  Bed Mobility Overal bed mobility: Needs Assistance Bed Mobility: Rolling;Sit to Supine Rolling: Supervision     Sit to supine: Min assist   General bed mobility comments: Assist to elevate LEs back to bed and cues for sternal precuations  Transfers Overall transfer level: Needs assistance   Transfers: Sit to/from Stand Sit to Stand: Min guard         General transfer comment: Min Guard A for safety  Ambulation/Gait Ambulation/Gait assistance: Supervision Gait Distance (Feet): 1200 Feet Assistive device: None Gait Pattern/deviations: WFL(Within Functional Limits)   Gait velocity interpretation: 1.31 - 2.62 ft/sec,  indicative of limited community ambulator General Gait Details: Pt with good gait speed and no evidence of imbalance   Stairs             Wheelchair Mobility    Modified Rankin (Stroke Patients Only)       Balance Overall balance assessment: No apparent balance deficits (not formally assessed)                                          Cognition Arousal/Alertness: Awake/alert Behavior During Therapy: WFL for tasks assessed/performed Overall Cognitive Status: Within Functional Limits for tasks assessed                                        Exercises      General Comments General comments (skin integrity, edema, etc.): VSS on room air, no DOE at this time      Pertinent Vitals/Pain Pain Assessment: Faces Faces Pain Scale: Hurts little more Pain Location: Right side of chest Pain Descriptors / Indicators: Aching;Grimacing;Constant Pain Intervention(s): Monitored during session;Limited activity within patient's tolerance;Repositioned    Home Living                      Prior Function            PT Goals (current goals can now be found in the care plan section) Acute Rehab PT Goals Patient Stated Goal: "get into and out of my shower." PT Goal Formulation: With patient Time For Goal Achievement: 04/18/18 Potential to Achieve Goals: Good Progress  towards PT goals: Progressing toward goals    Frequency    Min 3X/week      PT Plan Current plan remains appropriate    Co-evaluation              AM-PAC PT "6 Clicks" Daily Activity  Outcome Measure  Difficulty turning over in bed (including adjusting bedclothes, sheets and blankets)?: None Difficulty moving from lying on back to sitting on the side of the bed? : None Difficulty sitting down on and standing up from a chair with arms (e.g., wheelchair, bedside commode, etc,.)?: None Help needed moving to and from a bed to chair (including a wheelchair)?:  None Help needed walking in hospital room?: A Little Help needed climbing 3-5 steps with a railing? : A Little 6 Click Score: 22    End of Session   Activity Tolerance: Patient tolerated treatment well Patient left: in bed;with call bell/phone within reach Nurse Communication: Mobility status PT Visit Diagnosis: Difficulty in walking, not elsewhere classified (R26.2);Pain Pain - part of body: (sternal)     Time: 8003-4917 PT Time Calculation (min) (ACUTE ONLY): 15 min  Charges:  $Gait Training: 8-22 mins                     Alben Deeds, PT DPT  Board Certified Neurologic Specialist [place] Pager [phone] Office [place] 04/07/2018, 3:59 PM

## 2018-04-08 ENCOUNTER — Inpatient Hospital Stay (HOSPITAL_COMMUNITY): Payer: Medicare Other

## 2018-04-08 LAB — GLUCOSE, CAPILLARY
Glucose-Capillary: 118 mg/dL — ABNORMAL HIGH (ref 70–99)
Glucose-Capillary: 128 mg/dL — ABNORMAL HIGH (ref 70–99)
Glucose-Capillary: 130 mg/dL — ABNORMAL HIGH (ref 70–99)
Glucose-Capillary: 138 mg/dL — ABNORMAL HIGH (ref 70–99)
Glucose-Capillary: 141 mg/dL — ABNORMAL HIGH (ref 70–99)
Glucose-Capillary: 147 mg/dL — ABNORMAL HIGH (ref 70–99)

## 2018-04-08 LAB — BASIC METABOLIC PANEL
Anion gap: 5 (ref 5–15)
BUN: 24 mg/dL — ABNORMAL HIGH (ref 8–23)
CO2: 27 mmol/L (ref 22–32)
Calcium: 9.2 mg/dL (ref 8.9–10.3)
Chloride: 109 mmol/L (ref 98–111)
Creatinine, Ser: 1.47 mg/dL — ABNORMAL HIGH (ref 0.61–1.24)
GFR calc Af Amer: 55 mL/min — ABNORMAL LOW (ref >60)
GFR calc non Af Amer: 48 mL/min — ABNORMAL LOW (ref >60)
Glucose, Bld: 139 mg/dL — ABNORMAL HIGH (ref 70–99)
Potassium: 4 mmol/L (ref 3.5–5.1)
Sodium: 141 mmol/L (ref 135–145)

## 2018-04-08 LAB — CBC
HCT: 34.8 % — ABNORMAL LOW (ref 39.0–52.0)
Hemoglobin: 10.7 g/dL — ABNORMAL LOW (ref 13.0–17.0)
MCH: 30 pg (ref 26.0–34.0)
MCHC: 30.7 g/dL (ref 30.0–36.0)
MCV: 97.5 fL (ref 80.0–100.0)
Platelets: 156 10*3/uL (ref 150–400)
RBC: 3.57 MIL/uL — ABNORMAL LOW (ref 4.22–5.81)
RDW: 13.5 % (ref 11.5–15.5)
WBC: 6 10*3/uL (ref 4.0–10.5)
nRBC: 0 % (ref 0.0–0.2)

## 2018-04-08 MED ORDER — POTASSIUM CHLORIDE CRYS ER 20 MEQ PO TBCR
20.0000 meq | EXTENDED_RELEASE_TABLET | Freq: Every day | ORAL | Status: DC
  Administered 2018-04-08 – 2018-04-09 (×2): 20 meq via ORAL
  Filled 2018-04-08 (×2): qty 1

## 2018-04-08 MED ORDER — FUROSEMIDE 40 MG PO TABS
40.0000 mg | ORAL_TABLET | Freq: Every day | ORAL | Status: DC
  Administered 2018-04-08 – 2018-04-09 (×2): 40 mg via ORAL
  Filled 2018-04-08 (×2): qty 1

## 2018-04-08 MED FILL — Potassium Chloride Inj 2 mEq/ML: INTRAVENOUS | Qty: 40 | Status: AC

## 2018-04-08 MED FILL — Mannitol IV Soln 20%: INTRAVENOUS | Qty: 500 | Status: AC

## 2018-04-08 MED FILL — Magnesium Sulfate Inj 50%: INTRAMUSCULAR | Qty: 10 | Status: AC

## 2018-04-08 MED FILL — Sodium Bicarbonate IV Soln 8.4%: INTRAVENOUS | Qty: 50 | Status: AC

## 2018-04-08 MED FILL — Sodium Chloride IV Soln 0.9%: INTRAVENOUS | Qty: 2000 | Status: AC

## 2018-04-08 MED FILL — Heparin Sodium (Porcine) Inj 1000 Unit/ML: INTRAMUSCULAR | Qty: 30 | Status: AC

## 2018-04-08 MED FILL — Electrolyte-R (PH 7.4) Solution: INTRAVENOUS | Qty: 3000 | Status: AC

## 2018-04-08 MED FILL — Lidocaine HCl(Cardiac) IV PF Soln Pref Syr 100 MG/5ML (2%): INTRAVENOUS | Qty: 5 | Status: AC

## 2018-04-08 NOTE — Progress Notes (Addendum)
Patient ID: Nathan Medina, male   DOB: Sep 28, 1950, 67 y.o.   MRN: [id number] TCTS DAILY ICU PROGRESS NOTE                   [address]            [place] [address]          [phone]   7 Days Post-Op Procedure(s) (LRB): CORONARY ARTERY BYPASS GRAFTING (CABG) x Three, using left internal mammary artery and right leg greater saphenous vein harvested endoscopically (N/A) TRANSESOPHAGEAL ECHOCARDIOGRAM (TEE) (N/A)  Total Length of Stay:  LOS: 7 days   Subjective:  Patient having some pain this morning.  He doesn't think he can handle going home today.  + ambulation  + BM  Objective: Vital signs in last 24 hours: Temp:  [97.4 F (36.3 C)-98.3 F (36.8 C)] 97.8 F (36.6 C) (11/19 0718) Pulse Rate:  [41-64] 60 (11/19 0718) Cardiac Rhythm: Sinus bradycardia (11/19 0400) BP: (114-147)/(66-93) 129/84 (11/19 0718) SpO2:  [92 %-97 %] 94 % (11/19 0718) Weight:  [93.1 kg] 93.1 kg (11/19 0500)  Filed Weights   04/06/18 0500 04/07/18 0456 04/08/18 0500  Weight: 91.9 kg 92.2 kg 93.1 kg    Weight change: 0.9 kg   Intake/Output from previous day: 11/18 0701 - 11/19 0700 In: 960 [P.O.:960] Out: 650 [Urine:650]  Intake/Output this shift: Total I/O In: 240 [P.O.:240] Out: -   Current Meds: Scheduled Meds: . allopurinol  300 mg Oral Daily  . aspirin EC  325 mg Oral Daily   Or  . aspirin  324 mg Per Tube Daily  . atorvastatin  10 mg Oral QHS  . bisacodyl  10 mg Oral Daily   Or  . bisacodyl  10 mg Rectal Daily  . Chlorhexidine Gluconate Cloth  6 each Topical Daily  . docusate sodium  200 mg Oral Daily  . enoxaparin (LOVENOX) injection  30 mg Subcutaneous Q24H  . insulin aspart  0-24 Units Subcutaneous TID PC & HS  . insulin detemir  15 Units Subcutaneous Daily  . levothyroxine  175 mcg Oral QAC breakfast  . pantoprazole  40 mg Oral Daily  . potassium chloride  20 mEq Oral Daily  . sodium chloride flush  10-40 mL Intracatheter Q12H  . sodium chloride  flush  3 mL Intravenous Q12H   Continuous Infusions: . sodium chloride    . sodium chloride    . lactated ringers Stopped (04/04/18 1543)   PRN Meds:.sodium chloride, albuterol, ALPRAZolam, midazolam, ondansetron (ZOFRAN) IV, oxyCODONE, sodium chloride flush, sodium chloride flush, traMADol  General appearance: alert, cooperative and no distress Heart: regular rate and rhythm Lungs: diminished breath sounds bibasilar Abdomen: soft, non-tender; bowel sounds normal; no masses,  no organomegaly Extremities: edema trace Wound: clean and dry  Lab Results: CBC: Recent Labs    04/07/18 0445 04/08/18 0548  WBC 5.7 6.0  HGB 11.0* 10.7*  HCT 35.5* 34.8*  PLT 145* 156   BMET:  Recent Labs    04/07/18 0445 04/08/18 0548  NA 142 141  K 3.7 4.0  CL 109 109  CO2 27 27  GLUCOSE 138* 139*  BUN 33* 24*  CREATININE 1.64* 1.47*  CALCIUM 9.1 9.2    CMET: Lab Results  Component Value Date   WBC 6.0 04/08/2018   HGB 10.7 (L) 04/08/2018   HCT 34.8 (L) 04/08/2018   PLT 156 04/08/2018   GLUCOSE 139 (H) 04/08/2018   CHOL 190 04/01/2012  TRIG (H) 04/01/2012    525.0 Triglyceride is over 400; calculations on Lipids are invalid.   HDL 27.50 (L) 04/01/2012   LDLDIRECT 84.5 04/01/2012   LDLCALC 32 04/03/2010   ALT 30 04/05/2018   AST 24 04/05/2018   NA 141 04/08/2018   K 4.0 04/08/2018   CL 109 04/08/2018   CREATININE 1.47 (H) 04/08/2018   BUN 24 (H) 04/08/2018   CO2 27 04/08/2018   TSH 2.28 04/01/2012   PSA 3.39 04/01/2012   INR 1.55 04/01/2018   HGBA1C 6.2 (H) 02/14/2018   MICROALBUR 12.2 (H) 04/01/2012      PT/INR: No results for input(s): LABPROT, INR in the last 72 hours. Radiology: No results found.   Assessment/Plan: S/P Procedure(s) (LRB): CORONARY ARTERY BYPASS GRAFTING (CABG) x Three, using left internal mammary artery and right leg greater saphenous vein harvested endoscopically (N/A) TRANSESOPHAGEAL ECHOCARDIOGRAM (TEE) (N/A)  1. CV- Sinus Bradycardia-  continue to hold Amiodarone, Lopressor.. Will d/c EPW today 2. Pulm- no acute issues, off oxygen, CXR with small pleural effusion on left, continue IS 3. Renal- creatinine improved, down to 1.47, weight up 2 lbs from yesterday, with pleural effusion, will give a short course of oral Lasix 4. Dispo- patient stable, maintaining Sinus Bradycardia, will d/c EPW today, start gentle diuresis, will d/c PICC line as he has peripheral access, if remains stable will plan for d/c in AM    Grace Isaac 04/08/2018 8:47 AM

## 2018-04-08 NOTE — Progress Notes (Signed)
Occupational Therapy Treatment Patient Details Name: Nathan Medina MRN: [id number] DOB: [birth year] Today's Date: 04/08/2018    History of present illness Pt is a 67 y.o. 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 Recent PET scan showed low uptake subscapsular lesion in left kidney which is highly suspicious for renal cell carcinoma.    OT comments  Pt progressing towards established OT goals. Pt performing UB and LB dressing with supervision demonstrating understanding of compensatory techniques to adhere to sternal precautions. Reviewed sternal precautions and adherence during ADLs. Continue to recommend dc home with HHOT and will continue to follow acutely as admitted.    Follow Up Recommendations  Home health OT;Supervision/Assistance - 24 hour    Equipment Recommendations  Tub/shower seat    Recommendations for Other Services      Precautions / Restrictions Precautions Precautions: Sternal Precaution Booklet Issued: Yes (comment) Precaution Comments: Temp pacemaker. Verbally reviewed and provided written handout. Pt verbalized understanding of sternal precautions Restrictions Weight Bearing Restrictions: Yes(sternal precautions) Other Position/Activity Restrictions: sternal precautions       Mobility Bed Mobility               General bed mobility comments: In recliner upon arrival  Transfers Overall transfer level: Modified independent   Transfers: Sit to/from Stand Sit to Stand: Modified independent (Device/Increase time)         General transfer comment: Increased time and cues for hand placement    Balance Overall balance assessment: No apparent balance deficits (not formally assessed)                                         ADL either performed or assessed with clinical judgement   ADL Overall ADL's : Needs assistance/impaired                  Upper Body Dressing : Set up;Supervision/safety;Standing Upper Body Dressing Details (indicate cue type and reason): Cues for adherance to sternal precautions Lower Body Dressing: Set up;Supervision/safety;Sit to/from stand Lower Body Dressing Details (indicate cue type and reason): Supervision for safety. Pt using compensatory techniques             Functional mobility during ADLs: Supervision/safety General ADL Comments: Pt demonstrating understanding of dressing techniques and performing at supervision level     Vision       Perception     Praxis      Cognition Arousal/Alertness: Awake/alert Behavior During Therapy: WFL for tasks assessed/performed Overall Cognitive Status: Within Functional Limits for tasks assessed                                          Exercises     Shoulder Instructions       General Comments VSS    Pertinent Vitals/ Pain       Pain Assessment: Faces Faces Pain Scale: Hurts little more Pain Location: Right lower back pain Pain Descriptors / Indicators: Aching;Grimacing;Constant Pain Intervention(s): Monitored during session;Repositioned;Other (comment)(massage)  Home Living  Prior Functioning/Environment              Frequency  Min 2X/week        Progress Toward Goals  OT Goals(current goals can now be found in the care plan section)  Progress towards OT goals: Progressing toward goals  Acute Rehab OT Goals Patient Stated Goal: "get into and out of my shower." OT Goal Formulation: With patient/family Time For Goal Achievement: 04/20/18 Potential to Achieve Goals: Good ADL Goals Pt Will Perform Grooming: with modified independence;standing Pt Will Perform Upper Body Dressing: with supervision;sitting Pt Will Perform Lower Body Dressing: with modified independence;sit to/from stand Pt Will Perform Tub/Shower Transfer: with  supervision;ambulating;Tub transfer;Shower transfer;shower seat  Plan Discharge plan remains appropriate    Co-evaluation                 AM-PAC PT "6 Clicks" Daily Activity     Outcome Measure   Help from another person eating meals?: None Help from another person taking care of personal grooming?: A Little Help from another person toileting, which includes using toliet, bedpan, or urinal?: A Little Help from another person bathing (including washing, rinsing, drying)?: A Little Help from another person to put on and taking off regular upper body clothing?: None Help from another person to put on and taking off regular lower body clothing?: None 6 Click Score: 21    End of Session    OT Visit Diagnosis: Other abnormalities of gait and mobility (R26.89);Pain Pain - Right/Left: Right   Activity Tolerance Patient tolerated treatment well   Patient Left Other (comment)(In hallway with PT)   Nurse Communication Mobility status;Precautions        Time: 6553-7482 OT Time Calculation (min): 14 min  Charges: OT General Charges $OT Visit: 1 Visit OT Treatments $Self Care/Home Management : 8-22 mins  [place], OTR/L Acute Rehab Pager: [phone] Office: [place] 04/08/2018, 3:43 PM

## 2018-04-08 NOTE — Plan of Care (Signed)
  Problem: Activity: Goal: Risk for activity intolerance will decrease Outcome: Progressing   Problem: Safety: Goal: Ability to remain free from injury will improve Outcome: Progressing   Problem: Activity: Goal: Risk for activity intolerance will decrease Outcome: Progressing   Problem: Cardiac: Goal: Will achieve and/or maintain hemodynamic stability Outcome: Progressing   Problem: Clinical Measurements: Goal: Postoperative complications will be avoided or minimized Outcome: Progressing   Problem: Skin Integrity: Goal: Wound healing without signs and symptoms of infection Outcome: Progressing Goal: Risk for impaired skin integrity will decrease Outcome: Progressing

## 2018-04-08 NOTE — Plan of Care (Signed)
?  Problem: Clinical Measurements: ?Goal: Will remain free from infection ?Outcome: Progressing ?  ?

## 2018-04-08 NOTE — Progress Notes (Signed)
Patient arrived the unit from [facility] on a wheelchair, assessment completed see flowsheet, placed on tele ccmd notified, patient oriented to room and staff, bed in lowest position call bell within reach will monitor.

## 2018-04-08 NOTE — Progress Notes (Signed)
Physical Therapy Treatment Patient Details Name: Nathan Medina MRN: [id number] DOB: 04/22/1951 Today's Date: 04/08/2018    History of Present Illness Pt is a 67 y.o. 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 Recent PET scan showed low uptake subscapsular lesion in left kidney which is highly suspicious for renal cell carcinoma.     PT Comments    Patient seen for activity progression. Mobilizing very well today. Spoke with patient at length regarding mobility expectations. Patient independent with basic mobility and supervision with increased distance and stair negotiation. At this time, feel patient will be safe for d/c home. No further acute PT needs. Will sign off.  Follow Up Recommendations  Home health PT     Equipment Recommendations  None recommended by PT    Recommendations for Other Services       Precautions / Restrictions Precautions Precautions: Sternal Precaution Booklet Issued: Yes (comment) Precaution Comments: Temp pacemaker. Verbally reviewed and provided written handout. Pt verbalized understanding of sternal precautions Restrictions Weight Bearing Restrictions: Yes(sternal precautions) Other Position/Activity Restrictions: sternal precautions    Mobility  Bed Mobility               General bed mobility comments: received up with OT  Transfers Overall transfer level: Needs assistance   Transfers: Sit to/from Stand Sit to Stand: Independent            Ambulation/Gait Ambulation/Gait assistance: Supervision Gait Distance (Feet): 980 Feet Assistive device: None Gait Pattern/deviations: WFL(Within Functional Limits)   Gait velocity interpretation: 1.31 - 2.62 ft/sec, indicative of limited community ambulator General Gait Details: Pt with good gait speed and no evidence of imbalance   Stairs Stairs: Yes Stairs assistance:  Supervision Stair Management: One rail Right Number of Stairs: 6 General stair comments: VCs on sequencing    Wheelchair Mobility    Modified Rankin (Stroke Patients Only)       Balance Overall balance assessment: No apparent balance deficits (not formally assessed)                                          Cognition Arousal/Alertness: Awake/alert Behavior During Therapy: WFL for tasks assessed/performed Overall Cognitive Status: Within Functional Limits for tasks assessed                                        Exercises      General Comments        Pertinent Vitals/Pain Pain Assessment: Faces Faces Pain Scale: Hurts little more Pain Location: right hip pain  Pain Descriptors / Indicators: Aching;Grimacing;Constant Pain Intervention(s): Monitored during session    Home Living                      Prior Function            PT Goals (current goals can now be found in the care plan section) Acute Rehab PT Goals Patient Stated Goal: "get into and out of my shower." PT Goal Formulation: With patient Time For Goal Achievement: 04/18/18 Potential to Achieve Goals: Good Progress towards PT goals: Progressing toward goals    Frequency    Min 3X/week      PT Plan Current plan  remains appropriate    Co-evaluation              AM-PAC PT "6 Clicks" Daily Activity  Outcome Measure  Difficulty turning over in bed (including adjusting bedclothes, sheets and blankets)?: None Difficulty moving from lying on back to sitting on the side of the bed? : None Difficulty sitting down on and standing up from a chair with arms (e.g., wheelchair, bedside commode, etc,.)?: None Help needed moving to and from a bed to chair (including a wheelchair)?: None Help needed walking in hospital room?: A Little Help needed climbing 3-5 steps with a railing? : A Little 6 Click Score: 22    End of Session   Activity Tolerance:  Patient tolerated treatment well Patient left: (left in restroom) Nurse Communication: Mobility status PT Visit Diagnosis: Difficulty in walking, not elsewhere classified (R26.2);Pain Pain - part of body: (sternal)     Time: 1447-1500 PT Time Calculation (min) (ACUTE ONLY): 13 min  Charges:  $Gait Training: 8-22 mins                     Alben Deeds, PT DPT  Board Certified Neurologic Specialist [place] Pager [phone] Office [place] 04/08/2018, 3:29 PM

## 2018-04-09 LAB — GLUCOSE, CAPILLARY
Glucose-Capillary: 126 mg/dL — ABNORMAL HIGH (ref 70–99)
Glucose-Capillary: 164 mg/dL — ABNORMAL HIGH (ref 70–99)

## 2018-04-09 MED ORDER — TRAMADOL HCL 50 MG PO TABS: 50.0000 mg | ORAL_TABLET | ORAL | 0 refills | Status: DC | PRN

## 2018-04-09 MED ORDER — ASPIRIN 325 MG PO TBEC: 325.0000 mg | DELAYED_RELEASE_TABLET | Freq: Every day | ORAL | 0 refills | Status: DC

## 2018-04-09 MED ORDER — POTASSIUM CHLORIDE CRYS ER 10 MEQ PO TBCR: 10.0000 meq | EXTENDED_RELEASE_TABLET | Freq: Every day | ORAL | 3 refills | Status: DC

## 2018-04-09 NOTE — Progress Notes (Addendum)
      [address]       [place] [address]             [phone]      8 Days Post-Op Procedure(s) (LRB): CORONARY ARTERY BYPASS GRAFTING (CABG) x Three, using left internal mammary artery and right leg greater saphenous vein harvested endoscopically (N/A) TRANSESOPHAGEAL ECHOCARDIOGRAM (TEE) (N/A)   Subjective:  No new complaints.  He continues to feel better.  + ambulation  + BM  Objective: Vital signs in last 24 hours: Temp:  [97.5 F (36.4 C)-98.1 F (36.7 C)] 97.8 F (36.6 C) (11/20 0432) Pulse Rate:  [51-60] 51 (11/20 0432) Cardiac Rhythm: Sinus bradycardia (11/19 1900) Resp:  [16-19] 19 (11/20 0432) BP: (129-157)/(65-98) 129/77 (11/20 0432) SpO2:  [91 %-94 %] 91 % (11/20 0432) Weight:  [91.8 kg] 91.8 kg (11/20 0432)  Intake/Output from previous day: 11/19 0701 - 11/20 0700 In: 480 [P.O.:480] Out: 650 [Urine:650]  General appearance: alert, cooperative and no distress Heart: regular rate and rhythm Lungs: clear to auscultation bilaterally Abdomen: soft, non-tender; bowel sounds normal; no masses,  no organomegaly Extremities: edema trace Wound: clean and dry  Lab Results: Recent Labs    04/07/18 0445 04/08/18 0548  WBC 5.7 6.0  HGB 11.0* 10.7*  HCT 35.5* 34.8*  PLT 145* 156   BMET:  Recent Labs    04/07/18 0445 04/08/18 0548  NA 142 141  K 3.7 4.0  CL 109 109  CO2 27 27  GLUCOSE 138* 139*  BUN 33* 24*  CREATININE 1.64* 1.47*  CALCIUM 9.1 9.2    PT/INR: No results for input(s): LABPROT, INR in the last 72 hours. ABG    Component Value Date/Time   PHART 7.372 04/01/2018 1953   HCO3 23.2 04/01/2018 1953   TCO2 26 04/02/2018 1739   ACIDBASEDEF 2.0 04/01/2018 1953   O2SAT 69.6 04/04/2018 0555   CBG (last 3)  Recent Labs    04/08/18 1633 04/08/18 2149 04/09/18 0626  GLUCAP 147* 130* 126*    Assessment/Plan: S/P Procedure(s) (LRB): CORONARY ARTERY BYPASS GRAFTING (CABG) x Three, using left internal mammary artery  and right leg greater saphenous vein harvested endoscopically (N/A) TRANSESOPHAGEAL ECHOCARDIOGRAM (TEE) (N/A)  1. CV- Bradycardia with PVCs- continue to hold BB 2. Pulm- no acute issues, off oxygen 3. Renal- creatinine has been improving, weight is trending down, will taper lasix 4. Dispo- patient stable, will d/c home today   LOS: 8 days    Erin Barrett 04/09/2018  Home on no beta blocker because of bradycardia  I have seen and examined Nathan Medina and agree with the above assessment  and plan.  Grace Isaac MD [place] [phone] Office [phone] 04/09/2018 10:56 AM

## 2018-04-09 NOTE — Care Management Note (Signed)
Case Management Note  Patient Details  Name: Nathan Medina MRN: [id number] Date of Birth: 1950-07-22  Subjective/Objective:       S/p cabg x 3             Action/Plan: PTA home with spouse. PT recommended HH. Orders placed. Confirmed with patient and spouse for agency of choice, [facility]. Telephone to [place] to notify of referral and discharge status.   Expected Discharge Date:  04/09/18               Expected Discharge Plan:  [place]  In-House Referral:  NA  Discharge planning Services  CM Consult  Post Acute Care Choice:  Home Health Choice offered to:  Patient, Spouse  DME Arranged:  N/A DME Agency:  NA  HH Arranged:  PT [place] Agency:  [place] (now [facility])  Status of Service:  Completed, signed off  If discussed at [place] of Stay Meetings, dates discussed:    Additional Comments:  Bartholomew Crews, RN 04/09/2018, 12:41 PM

## 2018-04-09 NOTE — Progress Notes (Signed)
Patient is ready for discharge. He is alert and oriented. He has been removed from telemetry and IV removed without complication, catheter intact. He will be transported home by his sister. He will leave unit by wheelchair and meet his sister at the front entrance of the hospital.

## 2018-04-09 NOTE — Progress Notes (Signed)
CARDIAC REHAB PHASE I   PRE:  Rate/Rhythm: 66 SR  BP:  Sitting: 130/85        SaO2: 97 RA  MODE:  Ambulation: 790 ft   POST:  Rate/Rhythm: 69 SR  BP:  Sitting: 133/67        SaO2: 97 RA  0930 - 1045  Pt ambulated independently for 790 ft. Tolerated well. Gait quick and steady. Ed complete on sternal precautions and importance of IS. Ed complete on exercise guidelines and nutrition ([place] and DM). Pt voices understanding. Pt referred to [facility] at [facility].   Philis Kendall, MS 04/09/2018 10:41 AM

## 2018-04-09 NOTE — Care Management Important Message (Signed)
Important Message  Patient Details  Name: Nathan Medina MRN: [id number] Date of Birth: 03-04-1951   Medicare Important Message Given:  Yes    Mahira Petras 04/09/2018, 3:10 PM

## 2018-04-10 ENCOUNTER — Telehealth (HOSPITAL_COMMUNITY): Payer: Self-pay

## 2018-04-10 ENCOUNTER — Ambulatory Visit: Payer: Medicare Other | Admitting: Cardiothoracic Surgery

## 2018-04-10 NOTE — Telephone Encounter (Signed)
Called patient to see if he is interested in the Cardiac Rehab Program. Patient expressed interest. Explained scheduling process and went over insurance, patient verbalized understanding. Will contact patient for scheduling once f/u has been completed.  °

## 2018-04-10 NOTE — Telephone Encounter (Signed)
Pt insurance is active and benefits verified through [facility]. Co-pay $20.00, DED $0.00/$0.00 met, out of pocket $3,000.00/$2,878.77 met, co-insurance 0%. No pre-authorization required. Passport, 04/10/18 @ 2:57PM, REF# [id number]  Will contact patient to see if he is interested in the Cardiac Rehab Program. If interested, patient will need to complete follow up appt. Once completed, patient will be contacted for scheduling upon review by the RN Navigator.

## 2018-04-21 ENCOUNTER — Telehealth: Payer: Self-pay

## 2018-04-21 NOTE — Telephone Encounter (Signed)
Nathan Medina contacted the office stating his upper body has began to "get stiff" due to limiting his upper body movements.  He is s/p CABG x3 04/01/18 with Dr. Servando Snare.  He stated he has been limiting his movements since because he was told to do so.  I advised that he could do normal ADL's and use his upper body, including showering just restrict upper body pushing/ pulling/ and lifting.  He stated that he was having someone help him with that.  He acknowledged receipt and thanked me for the return call.

## 2018-04-28 NOTE — Telephone Encounter (Signed)
Pt returned [facility] phone call and stated he would like to join CR. Patient will come in for orientation on 06/10/2018 @ 8:15AM and will attend the 11:15AM exercise class.  Mailed homework package.

## 2018-04-28 NOTE — Telephone Encounter (Signed)
Attempted to call patient in regards to Cardiac Rehab - LM on VM 

## 2018-05-08 ENCOUNTER — Ambulatory Visit
Admission: RE | Admit: 2018-05-08 | Discharge: 2018-05-08 | Disposition: A | Discharge status: Home / Self Care | Payer: Medicare Other | Source: Ambulatory Visit | Attending: Cardiothoracic Surgery | Admitting: Cardiothoracic Surgery

## 2018-05-08 ENCOUNTER — Other Ambulatory Visit: Payer: Self-pay | Admitting: Cardiothoracic Surgery

## 2018-05-08 ENCOUNTER — Ambulatory Visit: Payer: Medicare Other | Admitting: Cardiothoracic Surgery

## 2018-05-08 VITALS — BP 117/81 | HR 71 | Resp 20 | Ht 67.0 in | Wt 189.0 lb

## 2018-05-08 DIAGNOSIS — Z951 Presence of aortocoronary bypass graft: Secondary | ICD-10-CM

## 2018-05-08 DIAGNOSIS — I712 Thoracic aortic aneurysm, without rupture, unspecified: Secondary | ICD-10-CM

## 2018-05-08 DIAGNOSIS — R911 Solitary pulmonary nodule: Secondary | ICD-10-CM

## 2018-05-08 DIAGNOSIS — I2510 Atherosclerotic heart disease of native coronary artery without angina pectoris: Secondary | ICD-10-CM

## 2018-05-08 NOTE — Progress Notes (Signed)
[address]       [place] [address]             [phone]                  Kwane D [place] Medical Record #[id number] Date of Birth: 1951/04/07  Referring SW:[id number], Reynold Bowen, MD Primary Cardiology: Primary Care:Pharr, Thayer Jew, MD  Chief Complaint:  Follow Up Visit DATE OF PROCEDURE:  04/01/2018  PREOPERATIVE DIAGNOSIS:  Coronary occlusive disease with decreased left ventricular  function and high risk stress test.  POSTOPERATIVE DIAGNOSIS:  Coronary occlusive disease with decreased left ventricular function and high risk stress test.  SURGICAL PROCEDURE:  Coronary artery bypass grafting x3 with the left internal mammary to the left anterior descending coronary artery, reverse saphenous vein graft to the distal right coronary artery, reverse saphenous vein graft to the ramus  intermedius with right thigh greater saphenous endoscopic vein harvesting and cardiopulmonary bypass.  SURGEON:  Lanelle Bal, MD  History of Present Illness:      Patient comes in the office today in follow-up after recent coronary artery bypass grafting.  He notes he recently saw cardiology was started on Xarelto and change his blood pressure medication.  Patient was not sure why he was started on anticoagulation. he has been gaining strength.  His sister had been staying with him but he is now staying on his own.        Zubrod Score: At the time of surgery this patient's most appropriate activity status/level should be described as: []     0    Normal activity, no symptoms [x]     1    Restricted in physical strenuous activity but ambulatory, able to do out light work []     2    Ambulatory and capable of self care, unable to do work activities, up and about                 >50 % of waking hours                                                                                   []     3    Only limited self care, in bed greater than 50% of waking hours []      4    Completely disabled, no self care, confined to bed or chair []     5    Moribund  Social History   Tobacco Use  Smoking Status Never Smoker  Smokeless Tobacco Never Used       Allergies  Allergen Reactions  . Ciprofloxacin Rash  . Escitalopram Oxalate Itching  . Quinapril Hcl Rash  . Sulfa Antibiotics Swelling    Swelling in the ankles    Current Outpatient Medications  Medication Sig Dispense Refill  . allopurinol (ZYLOPRIM) 300 MG tablet TAKE 1 TABLET BY MOUTH EVERY DAY (Patient taking differently: Take 300 mg by mouth daily. ) 90 tablet 0  . ALPRAZolam (XANAX) 0.5 MG tablet Take 1 tablet (0.5 mg total) by mouth daily as needed for sleep. 1/2 - 1 by mouth once daily as needed (Patient taking  differently: Take 0.25-0.5 mg by mouth at bedtime as needed for sleep. ) 90 tablet 2  . aspirin EC 81 MG tablet Take 81 mg by mouth daily.    [name] aspirin-acetaminophen-caffeine (EXCEDRIN EXTRA STRENGTH) 250-250-65 MG tablet Take 1-2 tablets by mouth every 6 (six) hours as needed for headache.    [name] atorvastatin (LIPITOR) 10 MG tablet Take 10 mg by mouth at bedtime.    . calcium carbonate (TUMS) 500 MG chewable tablet Chew 2 tablets (400 mg of elemental calcium total) by mouth 2 (two) times daily. (Patient taking differently: Chew 2 tablets by mouth 2 (two) times daily as needed for indigestion. ) 90 tablet 1  . carvedilol (COREG) 3.125 MG tablet Take 3.125 mg by mouth 2 (two) times daily with a meal.    . Cholecalciferol (VITAMIN D-3) 5000 units TABS Take 5,000 Units by mouth at bedtime.    . cyclobenzaprine (FLEXERIL) 5 MG tablet Take 5 mg by mouth 3 (three) times daily as needed for muscle spasms.    . diphenhydrAMINE (BENADRYL) 25 MG tablet Take 25 mg by mouth at bedtime as needed for sleep.    . furosemide (LASIX) 40 MG tablet Take 120 mg by mouth daily.     . Guaifenesin 1200 MG TB12 Take 1,200 mg by mouth 2 (two) times daily as needed (cough).    . insulin glargine (LANTUS) 100 unit/mL  SOPN Inject 40 Units into the skin at bedtime.     [name] levothyroxine (SYNTHROID, LEVOTHROID) 175 MCG tablet Take 175 mcg by mouth daily before breakfast.    . Multiple Vitamins-Minerals (PRESERVISION AREDS 2 PO) Take 1 tablet by mouth 2 (two) times daily.    [name] omega-3 acid ethyl esters (LOVAZA) 1 g capsule Take 2 g by mouth 2 (two) times daily.     [name] omeprazole (PRILOSEC) 20 MG capsule Take 20 mg by mouth daily before breakfast.     . potassium chloride SA (K-DUR,KLOR-CON) 10 MEQ tablet Take 1 tablet (10 mEq total) by mouth daily. 30 tablet 3  . rivaroxaban (XARELTO) 2.5 MG TABS tablet Take 2.5 mg by mouth 2 (two) times daily.    . sildenafil (REVATIO) 20 MG tablet Take 20 mg by mouth daily as needed.    [name] spironolactone (ALDACTONE) 25 MG tablet Take 25 mg by mouth daily.    . traMADol (ULTRAM) 50 MG tablet Take 1-2 tablets (50-100 mg total) by mouth every 6 (six) hours as needed for moderate pain. 24 tablet 0  . triamcinolone cream (KENALOG) 0.1 % Apply 1 application topically 2 (two) times daily as needed (skin irritation).    . vitamin E (VITAMIN E) 1000 UNIT capsule Take 1,000 Units by mouth at bedtime.    [name] acetaminophen (TYLENOL) 325 MG tablet Take 2 tablets (650 mg total) by mouth every 6 (six) hours as needed for mild pain (or Fever >/= 101). (Patient not taking: Reported on 05/08/2018) 20 tablet 0  . aspirin EC 325 MG EC tablet Take 1 tablet (325 mg total) by mouth daily. (Patient not taking: Reported on 05/08/2018) 30 tablet 0  . traMADol (ULTRAM) 50 MG tablet Take 1 tablet (50 mg total) by mouth every 4 (four) hours as needed for moderate pain. (Patient not taking: Reported on 05/08/2018) 30 tablet 0   No current facility-administered medications for this visit.        Physical Exam: BP 117/81   Pulse 71   Resp 20   Ht 5\' 7"  (1.702 m)  Wt 189 lb (85.7 kg)   SpO2 99% Comment: RA  BMI 29.60 kg/m   General appearance: alert and cooperative Neurologic: intact Heart: regular  rate and rhythm, S1, S2 normal, no murmur, click, rub or gallop Lungs: clear to auscultation bilaterally Abdomen: soft, non-tender; bowel sounds normal; no masses,  no organomegaly Extremities: extremities normal, atraumatic, no cyanosis , very mild bilateral pedal edema  and Homans sign is negative, no sign of DVT Wound: Sternum is stable and well-healed   Diagnostic Studies & Laboratory data:         Recent Radiology Findings: Dg Chest 2 View  Result Date: 05/08/2018 CLINICAL DATA:  Post CABG. EXAM: CHEST - 2 VIEW COMPARISON:  04/08/2018 FINDINGS: Prior median sternotomy and CABG. Borderline heart size. No confluent airspace opacities or effusions. No acute bony abnormality. IMPRESSION: Borderline heart size.  No active disease. Electronically Signed   By: Rolm Baptise M.D.   On: 05/08/2018 10:59      I have independently reviewed the above radiology findings and reviewed findings  with the patient.  Recent Labs: Lab Results  Component Value Date   WBC 6.0 04/08/2018   HGB 10.7 (L) 04/08/2018   HCT 34.8 (L) 04/08/2018   PLT 156 04/08/2018   GLUCOSE 139 (H) 04/08/2018   CHOL 190 04/01/2012   TRIG (H) 04/01/2012    525.0 Triglyceride is over 400; calculations on Lipids are invalid.   HDL 27.50 (L) 04/01/2012   LDLDIRECT 84.5 04/01/2012   LDLCALC 32 04/03/2010   ALT 30 04/05/2018   AST 24 04/05/2018   NA 141 04/08/2018   K 4.0 04/08/2018   CL 109 04/08/2018   CREATININE 1.47 (H) 04/08/2018   BUN 24 (H) 04/08/2018   CO2 27 04/08/2018   TSH 2.28 04/01/2012   INR 1.55 04/01/2018   HGBA1C 6.2 (H) 02/14/2018      Assessment / Plan:   Patient making good progress following recent coronary artery bypass grafting Currently now on anticoagulation per cardiology, aspirin dose has been decreased Patient does complain of feeling "cold", he has a history of hypothyroid and is on replacement We will plan to see the patient back in 4 to 5 weeks.   Grace Isaac 05/08/2018  12:23 PM

## 2018-06-02 ENCOUNTER — Telehealth (HOSPITAL_COMMUNITY): Payer: Self-pay | Admitting: Pharmacist

## 2018-06-05 ENCOUNTER — Ambulatory Visit (INDEPENDENT_AMBULATORY_CARE_PROVIDER_SITE_OTHER): Payer: Self-pay | Admitting: Cardiothoracic Surgery

## 2018-06-05 VITALS — BP 112/66 | HR 44 | Resp 20 | Ht 67.0 in | Wt 197.0 lb

## 2018-06-05 DIAGNOSIS — Z951 Presence of aortocoronary bypass graft: Secondary | ICD-10-CM

## 2018-06-05 DIAGNOSIS — I2510 Atherosclerotic heart disease of native coronary artery without angina pectoris: Secondary | ICD-10-CM

## 2018-06-05 NOTE — Progress Notes (Signed)
Nathan Medina 68 y.o. male DOB 1950/09/06 MRN [id number]       Nutrition  No diagnosis found. Past Medical History:  Diagnosis Date  . Abnormal liver function test 05/23/2011  . ALLERGIC RHINITIS   . ANXIETY   . Aortic atherosclerosis ([place])   . Arthritis   . Bilateral renal cysts   . Cervical spondylolysis    Moderate  . Chronic kidney disease    CKD stage 3 per office visit note 08/08/17 on chart   . COLONIC POLYPS, HX OF   . Coronary artery disease   . DEPRESSION   . DIABETES MELLITUS, TYPE II   . Diverticulitis   . Epidermal cyst   . Fatty liver   . GERD   . GOUT   . History of inguinal hernia   . HOH (hard of hearing)    elft ear  . HYPERLIPIDEMIA   . HYPERTENSION   . IBS   . 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 Left renal mass 05/23/2011  . Lumbar radicular pain 06/03/2011  . Macular degeneration    right eye  . Multinodular goiter   . Pancreatic pseudocyst    Stable  . Peritonsillar abscess   . Pulmonary nodule    Right upper lobe  . Radial neck fracture   . Shortness of breath    occasional shortness of breath  with exertion  . Thyroid disease   . Thyroid nodule    Bilateral  . Vertigo    Meds reviewed.    Current Outpatient Medications (Endocrine & Metabolic):  .  insulin glargine (LANTUS) 100 unit/mL SOPN, Inject 40 Units into the skin at bedtime.  [name]  levothyroxine (SYNTHROID, LEVOTHROID) 175 MCG tablet, Take 175 mcg by mouth daily before breakfast.  Current Outpatient Medications (Cardiovascular):  .  atorvastatin (LIPITOR) 10 MG tablet, Take 10 mg by mouth at bedtime. .  carvedilol (COREG) 3.125 MG tablet, Take 3.125 mg by mouth 2 (two) times daily with a meal. .  furosemide (LASIX) 40 MG tablet, Take 120 mg by mouth daily.  [name]  omega-3 acid ethyl esters (LOVAZA) 1 g capsule, Take 2 g by mouth 2 (two) times daily.  .  sacubitril-valsartan (ENTRESTO) 24-26  MG, Take 1 tablet by mouth 2 (two) times daily. .  sildenafil (REVATIO) 20 MG tablet, Take 100 mg by mouth daily as needed.  [name]  spironolactone (ALDACTONE) 25 MG tablet, Take 25 mg by mouth daily.  Current Outpatient Medications (Respiratory):  .  diphenhydrAMINE (BENADRYL) 25 MG tablet, Take 25 mg by mouth at bedtime as needed for sleep. .  Guaifenesin 1200 MG TB12, Take 1,200 mg by mouth 2 (two) times daily as needed (cough).  Current Outpatient Medications (Analgesics):  .  acetaminophen (TYLENOL) 325 MG tablet, Take 2 tablets (650 mg total) by mouth every 6 (six) hours as needed for mild pain (or Fever >/= 101). [name]  allopurinol (ZYLOPRIM) 300 MG tablet, TAKE 1 TABLET BY MOUTH EVERY DAY (Patient taking differently: Take 300 mg by mouth daily. ) .  aspirin EC 325 MG EC tablet, Take 1 tablet (325 mg total) by mouth daily. [name]  aspirin EC 81 MG tablet, Take 81 mg by mouth daily. [name]  aspirin-acetaminophen-caffeine (EXCEDRIN EXTRA STRENGTH) 250-250-65 MG tablet, Take 1-2 tablets by mouth every 6 (six) hours as needed for headache. .  traMADol (ULTRAM) 50 MG tablet, Take 1-2 tablets (50-100 mg total) by mouth every  6 (six) hours as needed for moderate pain.  Current Outpatient Medications (Hematological):  .  rivaroxaban (XARELTO) 2.5 MG TABS tablet, Take 2.5 mg by mouth 2 (two) times daily.  Current Outpatient Medications (Other):  [name]  ALPRAZolam (XANAX) 0.5 MG tablet, Take 1 tablet (0.5 mg total) by mouth daily as needed for sleep. 1/2 - 1 by mouth once daily as needed (Patient taking differently: Take 0.25-0.5 mg by mouth at bedtime as needed for sleep. ) .  calcium carbonate (TUMS) 500 MG chewable tablet, Chew 2 tablets (400 mg of elemental calcium total) by mouth 2 (two) times daily. (Patient taking differently: Chew 2 tablets by mouth 2 (two) times daily as needed for indigestion. ) .  Cholecalciferol (VITAMIN D-3) 5000 units TABS, Take 5,000 Units by mouth at bedtime. .  cyclobenzaprine  (FLEXERIL) 5 MG tablet, Take 5 mg by mouth 3 (three) times daily as needed for muscle spasms. .  Multiple Vitamins-Minerals (PRESERVISION AREDS 2 PO), Take 1 tablet by mouth 2 (two) times daily. [name]  omeprazole (PRILOSEC) 20 MG capsule, Take 20 mg by mouth daily before breakfast.  .  potassium chloride SA (K-DUR,KLOR-CON) 10 MEQ tablet, Take 1 tablet (10 mEq total) by mouth daily. [name]  triamcinolone cream (KENALOG) 0.1 %, Apply 1 application topically 2 (two) times daily as needed (skin irritation). .  vitamin E (VITAMIN E) 1000 UNIT capsule, Take 1,000 Units by mouth at bedtime.   HT: Ht Readings from Last 1 Encounters:  06/05/18 5\' 7"  (1.702 m)    WT: Wt Readings from Last 5 Encounters:  06/05/18 197 lb (89.4 kg)  05/08/18 189 lb (85.7 kg)  04/09/18 202 lb 6.4 oz (91.8 kg)  03/28/18 196 lb 6.4 oz (89.1 kg)  03/20/18 194 lb (88 kg)     BMI = 30.85 (06/05/18)  Current tobacco use? No       Labs:  Lipid Panel     Component Value Date/Time   CHOL 190 04/01/2012 0822   TRIG (H) 04/01/2012 0822    525.0 Triglyceride is over 400; calculations on Lipids are invalid.   HDL 27.50 (L) 04/01/2012 0822   CHOLHDL 7 04/01/2012 0822   VLDL 105.0 (H) 04/01/2012 0822   LDLCALC 32 04/03/2010 1018   LDLDIRECT 84.5 04/01/2012 0822    Lab Results  Component Value Date   HGBA1C 6.2 (H) 02/14/2018   CBG (last 3)  No results for input(s): GLUCAP in the last 72 hours.  Nutrition Diagnosis ? Food-and nutrition-related knowledge deficit related to lack of exposure to information as related to diagnosis of: ? CVD ? Type 2 Diabetes ? Obese  I = 30-34.9 related to excessive energy intake as evidenced by a BMI = 30.85 (06/05/18)  Nutrition Goal(s):  ? To be determined  Plan:  Pt to attend nutrition classes ? Nutrition I ? Nutrition II ? Portion Distortion  ? Diabetes Blitz ? Diabetes Q & A Will provide client-centered nutrition education as part of interdisciplinary care.   Monitor and evaluate  progress toward nutrition goal with team.  Laurina Bustle, MS, RD, LDN 06/05/2018 8:21 PM

## 2018-06-05 NOTE — Progress Notes (Signed)
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 POSTOPERATIVE DIAGNOSIS:  Coronary occlusive disease with decreased left ventricular function and high risk stress test. SURGICAL PROCEDURE:  Coronary artery bypass grafting x3 with the left internal mammary to the left anterior descending coronary artery, reverse saphenous vein graft to the distal right coronary artery, reverse saphenous vein graft to the ramus  intermedius with right thigh greater saphenous endoscopic vein harvesting and cardiopulmonary bypass. SURGEON:  Lanelle Bal, MD  History of Present Illness:      Patient returns to the office today in follow-up after coronary artery bypass grafting November 2019.  The patient has been slow to progress has required modification of his medications for underlying heart failure.  He has been titrating his diuretic therapy.  He is ready to enroll in cardiac rehab next week     Zubrod Score: At the time of surgery this patient's most appropriate activity status/level should be described as: []     0    Normal activity, no symptoms [x]     1    Restricted in physical strenuous activity but ambulatory, able to do out light work []     2    Ambulatory and capable of self care, unable to do work activities, up and about                 >50 % of waking hours                                                                                   []     3    Only limited self care, in bed greater than 50% of waking hours []     4    Completely disabled, no self care, confined to bed or  chair []     5    Moribund  Social History   Tobacco Use  Smoking Status Never Smoker  Smokeless Tobacco Never Used       Allergies  Allergen Reactions  . Ciprofloxacin Rash  . Escitalopram Oxalate Itching  . Quinapril Hcl Rash  . Sulfa Antibiotics Swelling    Swelling in the ankles    Current Outpatient Medications  Medication Sig Dispense Refill  . acetaminophen (TYLENOL) 325 MG tablet Take 2 tablets (650 mg total) by mouth every 6 (six) hours as needed for mild pain (or Fever >/= 101). 20 tablet 0  . allopurinol (ZYLOPRIM) 300 MG tablet TAKE 1 TABLET BY MOUTH EVERY DAY (Patient taking differently: Take 300 mg by mouth daily. ) 90 tablet 0  . ALPRAZolam (XANAX) 0.5 MG tablet Take 1 tablet (0.5 mg total) by mouth daily as needed  for sleep. 1/2 - 1 by mouth once daily as needed (Patient taking differently: Take 0.25-0.5 mg by mouth at bedtime as needed for sleep. ) 90 tablet 2  . aspirin EC 325 MG EC tablet Take 1 tablet (325 mg total) by mouth daily. 30 tablet 0  . aspirin EC 81 MG tablet Take 81 mg by mouth daily.    [name] aspirin-acetaminophen-caffeine (EXCEDRIN EXTRA STRENGTH) 250-250-65 MG tablet Take 1-2 tablets by mouth every 6 (six) hours as needed for headache.    [name] atorvastatin (LIPITOR) 10 MG tablet Take 10 mg by mouth at bedtime.    . calcium carbonate (TUMS) 500 MG chewable tablet Chew 2 tablets (400 mg of elemental calcium total) by mouth 2 (two) times daily. (Patient taking differently: Chew 2 tablets by mouth 2 (two) times daily as needed for indigestion. ) 90 tablet 1  . carvedilol (COREG) 3.125 MG tablet Take 3.125 mg by mouth 2 (two) times daily with a meal.    . Cholecalciferol (VITAMIN D-3) 5000 units TABS Take 5,000 Units by mouth at bedtime.    . cyclobenzaprine (FLEXERIL) 5 MG tablet Take 5 mg by mouth 3 (three) times daily as needed for muscle spasms.    . diphenhydrAMINE (BENADRYL) 25 MG tablet Take 25 mg by mouth at bedtime as needed for sleep.    . furosemide  (LASIX) 40 MG tablet Take 120 mg by mouth daily.     . Guaifenesin 1200 MG TB12 Take 1,200 mg by mouth 2 (two) times daily as needed (cough).    . insulin glargine (LANTUS) 100 unit/mL SOPN Inject 40 Units into the skin at bedtime.     [name] levothyroxine (SYNTHROID, LEVOTHROID) 175 MCG tablet Take 175 mcg by mouth daily before breakfast.    . Multiple Vitamins-Minerals (PRESERVISION AREDS 2 PO) Take 1 tablet by mouth 2 (two) times daily.    [name] omega-3 acid ethyl esters (LOVAZA) 1 g capsule Take 2 g by mouth 2 (two) times daily.     [name] omeprazole (PRILOSEC) 20 MG capsule Take 20 mg by mouth daily before breakfast.     . potassium chloride SA (K-DUR,KLOR-CON) 10 MEQ tablet Take 1 tablet (10 mEq total) by mouth daily. 30 tablet 3  . rivaroxaban (XARELTO) 2.5 MG TABS tablet Take 2.5 mg by mouth 2 (two) times daily.    . sacubitril-valsartan (ENTRESTO) 24-26 MG Take 1 tablet by mouth 2 (two) times daily.    . sildenafil (REVATIO) 20 MG tablet Take 100 mg by mouth daily as needed.     [name] spironolactone (ALDACTONE) 25 MG tablet Take 25 mg by mouth daily.    . traMADol (ULTRAM) 50 MG tablet Take 1-2 tablets (50-100 mg total) by mouth every 6 (six) hours as needed for moderate pain. 24 tablet 0  . triamcinolone cream (KENALOG) 0.1 % Apply 1 application topically 2 (two) times daily as needed (skin irritation).    . vitamin E (VITAMIN E) 1000 UNIT capsule Take 1,000 Units by mouth at bedtime.     No current facility-administered medications for this visit.        Physical Exam: BP 112/66   Pulse (!) 44   Resp 20   Ht 5\' 7"  (1.702 m)   Wt 197 lb (89.4 kg)   SpO2 99% Comment: RA  BMI 30.85 kg/m  General appearance: alert, cooperative and no distress Head: Normocephalic, without obvious abnormality, atraumatic Neck: no adenopathy, no carotid bruit, no JVD, supple, symmetrical, trachea midline and thyroid not  enlarged, symmetric, no tenderness/mass/nodules Lymph nodes: Cervical, supraclavicular, and  axillary nodes normal. Resp: clear to auscultation bilaterally Cardio: regular rate and rhythm, S1, S2 normal, no murmur, click, rub or gallop GI: soft, non-tender; bowel sounds normal; no masses,  no organomegaly Extremities: extremities normal, atraumatic, no cyanosis , mild pedal edema bilaterally    Diagnostic Studies & Laboratory data:         Recent Radiology Findings: No results found.    I have independently reviewed the above radiology findings and reviewed findings  with the patient.  Recent Labs: Lab Results  Component Value Date   WBC 6.0 04/08/2018   HGB 10.7 (L) 04/08/2018   HCT 34.8 (L) 04/08/2018   PLT 156 04/08/2018   GLUCOSE 139 (H) 04/08/2018   CHOL 190 04/01/2012   TRIG (H) 04/01/2012    525.0 Triglyceride is over 400; calculations on Lipids are invalid.   HDL 27.50 (L) 04/01/2012   LDLDIRECT 84.5 04/01/2012   LDLCALC 32 04/03/2010   ALT 30 04/05/2018   AST 24 04/05/2018   NA 141 04/08/2018   K 4.0 04/08/2018   CL 109 04/08/2018   CREATININE 1.47 (H) 04/08/2018   BUN 24 (H) 04/08/2018   CO2 27 04/08/2018   TSH 2.28 04/01/2012   INR 1.55 04/01/2018   HGBA1C 6.2 (H) 02/14/2018      Assessment / Plan:   #1 status post coronary artery bypass grafting, with known underlying depressed LV function, needs continued monitoring of his diuretic therapy #2 history of groundglass opacity right upper lung, to have a follow-up CT of the chest in March of this year which will be approximately 6 months following his last scan done in August 2019. I will see the patient back in March following his CT scan   Grace Isaac 06/05/2018 3:33 PM

## 2018-06-05 NOTE — Telephone Encounter (Signed)
Cardiac Rehab - Pharmacy Resident Documentation   Patient unable to be reached after three call attempts. Please complete allergy verification and medication review during patient's cardiac rehab appointment.    Gwenlyn Found, Sherian Rein D PGY1 Pharmacy Resident  Phone [phone] 06/05/2018   11:25 AM

## 2018-06-06 ENCOUNTER — Telehealth (HOSPITAL_COMMUNITY): Payer: Self-pay

## 2018-06-10 ENCOUNTER — Encounter (HOSPITAL_COMMUNITY)
Admission: RE | Admit: 2018-06-10 | Discharge: 2018-06-10 | Disposition: A | Discharge status: Home / Self Care | Payer: Medicare Other | Source: Ambulatory Visit | Attending: Cardiology | Admitting: Cardiology

## 2018-06-10 ENCOUNTER — Encounter (HOSPITAL_COMMUNITY): Payer: Self-pay

## 2018-06-10 ENCOUNTER — Telehealth (HOSPITAL_COMMUNITY): Payer: Self-pay | Admitting: Cardiac Rehabilitation

## 2018-06-10 VITALS — Ht 67.0 in | Wt 197.8 lb

## 2018-06-10 DIAGNOSIS — Z951 Presence of aortocoronary bypass graft: Secondary | ICD-10-CM | POA: Insufficient documentation

## 2018-06-10 NOTE — Progress Notes (Signed)
Cardiac Rehab Medication Review by a Pharmacist  Does the patient  feel that his/her medications are working for him/her?  yes  Has the patient been experiencing any side effects to the medications prescribed?  Not sure  Does the patient measure his/her own blood pressure or blood glucose at home?  yes   Does the patient have any problems obtaining medications due to transportation or finances?   no  Understanding of regimen: excellent Understanding of indications: excellent Potential of compliance: excellent    Nurse comments: Nathan Medina says his hands and feet feel cold he is not sure if this is related to his medication. Patient is compliant with is medications and has a good understanding of what he takes and why.    Harrell Gave RN BSN 06/10/2018 10:06 AM

## 2018-06-10 NOTE — Progress Notes (Signed)
Nathan Medina 68 y.o. male DOB: July 05, 1950 MRN: [id number]      Nutrition Note  1. 04/01/18 CABG x3    Past Medical History:  Diagnosis Date  . Abnormal liver function test 05/23/2011  . ALLERGIC RHINITIS   . ANXIETY   . Aortic atherosclerosis ([place])   . Arthritis   . Bilateral renal cysts   . Cervical spondylolysis    Moderate  . Chronic kidney disease    CKD stage 3 per office visit note 08/08/17 on chart   . COLONIC POLYPS, HX OF   . Coronary artery disease   . DEPRESSION   . DIABETES MELLITUS, TYPE II   . Diverticulitis   . Epidermal cyst   . Fatty liver   . GERD   . GOUT   . History of inguinal hernia   . HOH (hard of hearing)    elft ear  . HYPERLIPIDEMIA   . HYPERTENSION   . IBS   . 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 Left renal mass 05/23/2011  . Lumbar radicular pain 06/03/2011  . Macular degeneration    right eye  . Multinodular goiter   . Pancreatic pseudocyst    Stable  . Peritonsillar abscess   . Pulmonary nodule    Right upper lobe  . Radial neck fracture   . Shortness of breath    occasional shortness of breath  with exertion  . Thyroid disease   . Thyroid nodule    Bilateral  . Vertigo    Meds reviewed.   Current Outpatient Medications (Endocrine & Metabolic):  .  insulin glargine (LANTUS) 100 unit/mL SOPN, Inject 40 Units into the skin at bedtime.  [name]  levothyroxine (SYNTHROID, LEVOTHROID) 175 MCG tablet, Take 175 mcg by mouth daily before breakfast.  Current Outpatient Medications (Cardiovascular):  .  atorvastatin (LIPITOR) 10 MG tablet, Take 10 mg by mouth at bedtime. .  carvedilol (COREG) 3.125 MG tablet, Take 3.125 mg by mouth 2 (two) times daily with a meal. .  furosemide (LASIX) 40 MG tablet, Take 120 mg by mouth daily.  [name]  omega-3 acid ethyl esters (LOVAZA) 1 g capsule, Take 2 g by mouth 2 (two) times daily.  .  sacubitril-valsartan (ENTRESTO)  24-26 MG, Take 1 tablet by mouth 2 (two) times daily. .  sildenafil (REVATIO) 20 MG tablet, Take 100 mg by mouth daily as needed.  [name]  spironolactone (ALDACTONE) 25 MG tablet, Take 25 mg by mouth daily.  Current Outpatient Medications (Respiratory):  .  diphenhydrAMINE (BENADRYL) 25 MG tablet, Take 25 mg by mouth at bedtime as needed for sleep. .  Guaifenesin 1200 MG TB12, Take 1,200 mg by mouth 2 (two) times daily as needed (cough).  Current Outpatient Medications (Analgesics):  .  acetaminophen (TYLENOL) 325 MG tablet, Take 2 tablets (650 mg total) by mouth every 6 (six) hours as needed for mild pain (or Fever >/= 101). [name]  allopurinol (ZYLOPRIM) 300 MG tablet, TAKE 1 TABLET BY MOUTH EVERY DAY (Patient taking differently: Take 300 mg by mouth daily. ) .  aspirin EC 81 MG tablet, Take 81 mg by mouth daily. [name]  aspirin-acetaminophen-caffeine (EXCEDRIN EXTRA STRENGTH) 250-250-65 MG tablet, Take 1-2 tablets by mouth every 6 (six) hours as needed for headache. .  traMADol (ULTRAM) 50 MG tablet, Take 1-2 tablets (50-100 mg total) by mouth every 6 (six) hours as needed for moderate pain.  Current Outpatient Medications (Hematological):  [name]  rivaroxaban (XARELTO) 2.5 MG TABS tablet, Take 2.5 mg by mouth 2 (two) times daily.  Current Outpatient Medications (Other):  [name]  ALPRAZolam (XANAX) 0.5 MG tablet, Take 1 tablet (0.5 mg total) by mouth daily as needed for sleep. 1/2 - 1 by mouth once daily as needed (Patient taking differently: Take 0.25-0.5 mg by mouth at bedtime as needed for sleep. ) .  calcium carbonate (TUMS) 500 MG chewable tablet, Chew 2 tablets (400 mg of elemental calcium total) by mouth 2 (two) times daily. (Patient taking differently: Chew 2 tablets by mouth 2 (two) times daily as needed for indigestion. ) .  Cholecalciferol (VITAMIN D-3) 5000 units TABS, Take 5,000 Units by mouth at bedtime. .  cyclobenzaprine (FLEXERIL) 5 MG tablet, Take 5 mg by mouth 3 (three) times daily as needed for  muscle spasms. .  Multiple Vitamins-Minerals (PRESERVISION AREDS 2 PO), Take 1 tablet by mouth 2 (two) times daily. [name]  omeprazole (PRILOSEC) 20 MG capsule, Take 20 mg by mouth daily before breakfast.  .  triamcinolone cream (KENALOG) 0.1 %, Apply 1 application topically 2 (two) times daily as needed (skin irritation). .  vitamin E (VITAMIN E) 1000 UNIT capsule, Take 1,000 Units by mouth at bedtime.   HT: Ht Readings from Last 1 Encounters:  06/10/18 5\' 7"  (1.702 m)    WT: Wt Readings from Last 5 Encounters:  06/10/18 197 lb 12 oz (89.7 kg)  06/05/18 197 lb (89.4 kg)  05/08/18 189 lb (85.7 kg)  04/09/18 202 lb 6.4 oz (91.8 kg)  03/28/18 196 lb 6.4 oz (89.1 kg)     Body mass index is 30.97 kg/m.   Current tobacco use? No  Labs:  Lipid Panel     Component Value Date/Time   CHOL 190 04/01/2012 0822   TRIG (H) 04/01/2012 0822    525.0 Triglyceride is over 400; calculations on Lipids are invalid.   HDL 27.50 (L) 04/01/2012 0822   CHOLHDL 7 04/01/2012 0822   VLDL 105.0 (H) 04/01/2012 0822   LDLCALC 32 04/03/2010 1018   LDLDIRECT 84.5 04/01/2012 0822    Lab Results  Component Value Date   HGBA1C 6.2 (H) 02/14/2018   CBG (last 3)  No results for input(s): GLUCAP in the last 72 hours.  Nutrition Note Spoke with pt. Nutrition plan and goals reviewed with pt. Pt is following Step 2 of the Therapeutic Lifestyle Changes diet. Pt wants to lose wt. Pt has not actively been trying to lose wt. Wt loss tips reviewed (label reading, how to build a healthy plate, healthy food choices, portion sizes, weighing and measuring portions for accuracy, eating frequently across the day). Pt has Type 2 Diabetes. Last A1c indicates blood glucose well-controlled. This Probation officer went over Diabetes Education test results. Pt checks CBG's 1 times a day. Fasting CBG's reportedly 110's-160's mg/dL. Per discussion, pt does not use canned/convenience foods often. Pt does not add salt to food. Pt does not eat out  frequently. Pt expressed understanding of the information reviewed. Pt aware of nutrition education classes offered and would like to attend nutrition classes.  Nutrition Diagnosis ? Food-and nutrition-related knowledge deficit related to lack of exposure to information as related to diagnosis of: ? CVD ? Type 2 Diabetes ? Obese  I = 30-34.9 related to excessive energy intake as evidenced by a Body mass index is 30.97 kg/m.  Nutrition Intervention ? Pt's individual nutrition plan and goals reviewed with pt. ? Pt given handouts for: ? Nutrition I class ? Nutrition  II class  ? Diabetes Blitz Class ? Diabetes Q & A class   Nutrition Goal(s):  ? Pt to identify and limit food sources of saturated fat, trans fat, refined carbohydrates and sodium ? Pt to identify food quantities necessary to achieve weight loss of 6-24 lbs. at graduation from cardiac rehab ? Pt to eat a variety of non-starchy vegetables  Plan:  ? Pt to attend nutrition classes ? Nutrition I ? Nutrition II ? Portion Distortion  ? Diabetes Blitz ? Diabetes Q & Ae determined ? Will provide client-centered nutrition education as part of interdisciplinary care ? Monitor and evaluate progress toward nutrition goal with team.   Laurina Bustle, MS, RD, LDN 06/10/2018 4:01 PM

## 2018-06-10 NOTE — Telephone Encounter (Signed)
-----   Message from [facility], MD sent at 06/10/2018 12:48 PM EST ----- Regarding: RE: cardiac rehab Please send me a telemetry report of his PVC's. I'll take a look.  I am okay with moderate physical activity. I would avoid heavy weight lifting.  Thanks Manish  Manish Esther Hardy, MD [facility] [place] Pager: [phone] Office: [phone] If no answer Cell [phone]   ----- Message ----- From: Lowell Guitar, RN Sent: 06/10/2018  11:47 AM EST To: [name], MD Subject: cardiac rehab                                  Dear Dr. Virgina Jock,  Pt attended cardiac rehab orientation today.  He had frequent PVCs.  Aymptomatic.  Is this expected for him?  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  Are there any activity restrictions?  And please indicate blood pressure parameters at rest and with exercise.  Thank you, Andi Hence, RN, BSN Cardiac Pulmonary Rehab

## 2018-06-10 NOTE — Progress Notes (Signed)
Cardiac Individual Treatment Plan  Patient Details  Name: Nathan Medina MRN: [id number] Date of Birth: Jun 15, 1950 Referring Provider:   Flowsheet Row CARDIAC REHAB PHASE II ORIENTATION from 06/10/2018 in [place]  Referring Provider  Dr. 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  acetaminophen (TYLENOL) 325 MG tablet, Take 2 tablets (650 mg total) by mouth every 6 (six) hours as needed for mild pain (or Fever >/= 101)., Disp: 20 tablet, Rfl: 0 .  allopurinol (ZYLOPRIM) 300 MG tablet, TAKE 1 TABLET BY MOUTH EVERY DAY (Patient taking differently: Take 300 mg by mouth daily. ), Disp: 90 tablet, Rfl: 0 .  ALPRAZolam (XANAX) 0.5 MG tablet, Take 1 tablet (0.5 mg total) by mouth daily as needed for sleep. 1/2 - 1 by mouth once daily as needed (Patient taking differently: Take 0.25-0.5 mg by mouth at bedtime as needed for sleep. ), Disp: 90 tablet, Rfl: 2 .  aspirin EC 81 MG tablet, Take 81 mg by mouth daily., Disp: , Rfl:  .  aspirin-acetaminophen-caffeine (EXCEDRIN EXTRA STRENGTH) 250-250-65 MG tablet, Take 1-2 tablets by mouth every 6 (six) hours as needed for headache., Disp: , Rfl:  .  atorvastatin (LIPITOR) 10 MG tablet, Take 10 mg by mouth at bedtime., Disp: , Rfl:  .  calcium carbonate (TUMS) 500 MG chewable tablet, Chew 2 tablets (400 mg of elemental calcium total) by mouth 2 (two) times daily. (Patient taking differently: Chew 2 tablets by mouth 2 (two) times daily as needed for indigestion. ), Disp: 90 tablet, Rfl: 1 .  carvedilol (COREG) 3.125 MG tablet, Take 3.125 mg by mouth 2 (two) times daily with a meal., Disp: , Rfl:  .  Cholecalciferol (VITAMIN D-3) 5000 units TABS, Take 5,000 Units by mouth at bedtime.,  Disp: , Rfl:  .  cyclobenzaprine (FLEXERIL) 5 MG tablet, Take 5 mg by mouth 3 (three) times daily as needed for muscle spasms., Disp: , Rfl:  .  diphenhydrAMINE (BENADRYL) 25 MG tablet, Take 25 mg by mouth at bedtime as needed for sleep., Disp: , Rfl:  .  furosemide (LASIX) 40 MG tablet, Take 120 mg by mouth daily. , Disp: , Rfl:  .  Guaifenesin 1200 MG TB12, Take 1,200 mg by mouth 2 (two) times daily as needed (cough)., Disp: , Rfl:  .  insulin glargine (LANTUS) 100 unit/mL SOPN, Inject 40 Units into the skin at bedtime. , Disp: , Rfl:  .  levothyroxine (SYNTHROID, LEVOTHROID) 175 MCG tablet, Take 175 mcg by mouth daily before breakfast., Disp: , Rfl:  .  Multiple Vitamins-Minerals (PRESERVISION AREDS 2 PO), Take 1 tablet by mouth 2 (two) times daily., Disp: , Rfl:  .  omega-3 acid ethyl esters (LOVAZA) 1 g capsule, Take 2 g by mouth 2 (two) times daily. , Disp: , Rfl:  .  omeprazole (PRILOSEC) 20 MG capsule, Take 20 mg by mouth daily before breakfast. , Disp: , Rfl:  .  rivaroxaban (XARELTO) 2.5 MG TABS tablet, Take 2.5 mg by mouth 2 (two) times daily., Disp: , Rfl:  .  sacubitril-valsartan (ENTRESTO) 24-26 MG, Take 1 tablet by mouth 2 (two) times daily., Disp: , Rfl:  .  sildenafil (REVATIO) 20 MG tablet, Take 100 mg by mouth  daily as needed. , Disp: , Rfl:  .  spironolactone (ALDACTONE) 25 MG tablet, Take 25 mg by mouth daily., Disp: , Rfl:  .  traMADol (ULTRAM) 50 MG tablet, Take 1-2 tablets (50-100 mg total) by mouth every 6 (six) hours as needed for moderate pain., Disp: 24 tablet, Rfl: 0 .  triamcinolone cream (KENALOG) 0.1 %, Apply 1 application topically 2 (two) times daily as needed (skin irritation)., Disp: , Rfl:  .  vitamin E (VITAMIN E) 1000 UNIT capsule, Take 1,000 Units by mouth at bedtime., Disp: , Rfl:   Past Medical History: Past Medical History:  Diagnosis Date  . Abnormal liver function test 05/23/2011  . ALLERGIC RHINITIS   . ANXIETY   . Aortic atherosclerosis ([place])    . Arthritis   . Bilateral renal cysts   . Cervical spondylolysis    Moderate  . Chronic kidney disease    CKD stage 3 per office visit note 08/08/17 on chart   . COLONIC POLYPS, HX OF   . Coronary artery disease   . DEPRESSION   . DIABETES MELLITUS, TYPE II   . Diverticulitis   . Epidermal cyst   . Fatty liver   . GERD   . GOUT   . History of inguinal hernia   . HOH (hard of hearing)    elft ear  . HYPERLIPIDEMIA   . HYPERTENSION   . IBS   . 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 Left renal mass 05/23/2011  . Lumbar radicular pain 06/03/2011  . Macular degeneration    right eye  . Multinodular goiter   . Pancreatic pseudocyst    Stable  . Peritonsillar abscess   . Pulmonary nodule    Right upper lobe  . Radial neck fracture   . Shortness of breath    occasional shortness of breath  with exertion  . Thyroid disease   . Thyroid nodule    Bilateral  . Vertigo     Tobacco Use: Social History   Tobacco Use  Smoking Status Never Smoker  Smokeless Tobacco Never Used    Labs: Recent Review Flowsheet Data    Labs for ITP Cardiac and Pulmonary Rehab Latest Ref Rng & Units 04/01/2018 04/01/2018 04/01/2018 04/02/2018 04/04/2018   Cholestrol 0 - 200 mg/dL - - - - -   LDLCALC 0 - 99 mg/dL - - - - -   LDLDIRECT mg/dL - - - - -   HDL >39.00 mg/dL - - - - -   Trlycerides 0.0 - 149.0 mg/dL - - - - -   Hemoglobin A1c 4.8 - 5.6 % - - - - -   PHART 7.350 - 7.450 7.325(L) - 7.372 - -   PCO2ART 32.0 - 48.0 mmHg 24.3(L) - 40.2 - -   HCO3 20.0 - 28.0 mmol/L 12.7(L) - 23.2 - -   TCO2 22 - 32 mmol/L 13(L) 24 24 26  -   ACIDBASEDEF 0.0 - 2.0 mmol/L 12.0(H) - 2.0 - -   O2SAT % 97.0 - 89.0 - 69.6      Capillary Blood Glucose: Lab Results  Component Value Date   GLUCAP 164 (H) 04/09/2018   GLUCAP 126 (H) 04/09/2018   GLUCAP 130 (H) 04/08/2018   GLUCAP 147 (H) 04/08/2018   GLUCAP 138 (H)  04/08/2018     Exercise Target Goals: Exercise Program Goal: Individual exercise prescription set using results from initial 6 min walk test  and THRR while considering  patient's activity barriers and safety.   Exercise Prescription Goal: Initial exercise prescription builds to 30-45 minutes a day of aerobic activity, 2-3 days per week.  Home exercise guidelines will be given to patient during program as part of exercise prescription that the participant will acknowledge.  Activity Barriers & Risk Stratification: Activity Barriers & Cardiac Risk Stratification - 06/10/18 1038    Activity Barriers & Cardiac Risk Stratification          Activity Barriers  Arthritis;Joint Problems    Cardiac Risk Stratification  High           6 Minute Walk: 6 Minute Walk    6 Minute Walk    Row Name 06/10/18 1037   Phase  Initial   Distance  1157 feet   Walk Time  6 minutes   # of Rest Breaks  0   MPH  2.19   METS  2.07   RPE  12   VO2 Peak  7.25   Symptoms  No   Resting HR  72 bpm   Resting BP  108/58   Resting Oxygen Saturation   96 %   Exercise Oxygen Saturation  during 6 min walk  97 %   Max Ex. HR  64 bpm   Max Ex. BP  102/54   2 Minute Post BP  102/78          Oxygen Initial Assessment:   Oxygen Re-Evaluation:   Oxygen Discharge (Final Oxygen Re-Evaluation):   Initial Exercise Prescription: Initial Exercise Prescription - 06/10/18 1100    Date of Initial Exercise RX and Referring Provider          Date  06/10/18    Referring Provider  Dr. Virgina Jock    Expected Discharge Date  09/15/18        NuStep          Level  2    SPM  75    Minutes  10    METs  2        Arm Ergometer          Level  1    Watts  30    Minutes  10    METs  2.75        Track          Laps  7    Minutes  10    METs  2.23        Prescription Details          Frequency (times per week)  3    Duration  Progress to 30 minutes of continuous aerobic without signs/symptoms of  physical distress        Intensity          THRR 40-80% of Max Heartrate  61-122    Ratings of Perceived Exertion  11-13        Progression          Progression  Continue to progress workloads to maintain intensity without signs/symptoms of physical distress.        Resistance Training          Training Prescription  Yes    Weight  2 lbs.     Reps  10-15           Perform Capillary Blood Glucose checks as needed.  Exercise Prescription Changes:   Exercise Comments:   Exercise Goals and Review: Exercise Goals    Exercise Goals  [place] Name 06/10/18 0900   Increase Physical Activity  Yes   Intervention  Provide advice, education, support and counseling about physical activity/exercise needs.;Develop an individualized exercise prescription for aerobic and resistive training based on initial evaluation findings, risk stratification, comorbidities and participant's personal goals.   Expected Outcomes  Short Term: Attend rehab on a regular basis to increase amount of physical activity.;Long Term: Add in home exercise to make exercise part of routine and to increase amount of physical activity.;Long Term: Exercising regularly at least 3-5 days a week.   Increase Strength and Stamina  Yes   Intervention  Provide advice, education, support and counseling about physical activity/exercise needs.;Develop an individualized exercise prescription for aerobic and resistive training based on initial evaluation findings, risk stratification, comorbidities and participant's personal goals.   Expected Outcomes  Short Term: Increase workloads from initial exercise prescription for resistance, speed, and METs.;Short Term: Perform resistance training exercises routinely during rehab and add in resistance training at home;Long Term: Improve cardiorespiratory fitness, muscular endurance and strength as measured by increased METs and functional capacity (6MWT)   Able to understand and use rate of  perceived exertion (RPE) scale  Yes   Intervention  Provide education and explanation on how to use RPE scale   Expected Outcomes  Short Term: Able to use RPE daily in rehab to express subjective intensity level;Long Term:  Able to use RPE to guide intensity level when exercising independently   Knowledge and understanding of Target Heart Rate Range (THRR)  Yes   Intervention  Provide education and explanation of THRR including how the numbers were predicted and where they are located for reference   Expected Outcomes  Short Term: Able to state/look up THRR;Long Term: Able to use THRR to govern intensity when exercising independently;Short Term: Able to use daily as guideline for intensity in rehab   Able to check pulse independently  Yes   Intervention  Provide education and demonstration on how to check pulse in carotid and radial arteries.;Review the importance of being able to check your own pulse for safety during independent exercise   Expected Outcomes  Short Term: Able to explain why pulse checking is important during independent exercise;Long Term: Able to check pulse independently and accurately   Understanding of Exercise Prescription  Yes   Intervention  Provide education, explanation, and written materials on patient's individual exercise prescription   Expected Outcomes  Short Term: Able to explain program exercise prescription;Long Term: Able to explain home exercise prescription to exercise independently          Exercise Goals Re-Evaluation :   Discharge Exercise Prescription (Final Exercise Prescription Changes):   Nutrition:  Target Goals: Understanding of nutrition guidelines, daily intake of sodium 1500mg , cholesterol 200mg , calories 30% from fat and 7% or less from saturated fats, daily to have 5 or more servings of fruits and vegetables.  Biometrics: Pre Biometrics - 06/10/18 1048    Pre Biometrics          Height  5\' 7"  (1.702 m)    Weight  89.7 kg    Waist  Circumference  41 inches    Hip Circumference  41.75 inches    Waist to Hip Ratio  0.98 %    BMI (Calculated)  30.97    Triceps Skinfold  15.5 mm    % Body Fat  29.5 %    Grip Strength  35.5 kg    Flexibility  0 in    Single Leg Stand  1.56  seconds            Nutrition Therapy Plan and Nutrition Goals:   Nutrition Assessments:   Nutrition Goals Re-Evaluation:   Nutrition Goals Re-Evaluation:   Nutrition Goals Discharge (Final Nutrition Goals Re-Evaluation):   Psychosocial: Target Goals: Acknowledge presence or absence of significant depression and/or stress, maximize coping skills, provide positive support system. Participant is able to verbalize types and ability to use techniques and skills needed for reducing stress and depression.  Initial Review & Psychosocial Screening: Initial Psych Review & Screening - 06/10/18 1140    Initial Review          Current issues with  None Identified        Family Dynamics          Good Support System?  Yes   family/friends        Barriers          Psychosocial barriers to participate in program  There are no identifiable barriers or psychosocial needs.        Screening Interventions          Interventions  Encouraged to exercise           Quality of Life Scores: Quality of Life - 06/10/18 1103    Quality of Life          Select  Quality of Life        Quality of Life Scores          Health/Function Pre  20.7 %    Socioeconomic Pre  26.8 %    Psych/Spiritual Pre  23.7 %    Family Pre  30 %    GLOBAL Pre  24 %          Scores of 19 and below usually indicate a poorer quality of life in these areas.  A difference of  2-3 points is a clinically meaningful difference.  A difference of 2-3 points in the total score of the Quality of Life Index has been associated with significant improvement in overall quality of life, self-image, physical symptoms, and general health in studies assessing change in quality of  life.  PHQ-9: Recent Review Flowsheet Data    There is no flowsheet data to display.     Interpretation of Total Score  Total Score Depression Severity:  1-4 = Minimal depression, 5-9 = Mild depression, 10-14 = Moderate depression, 15-19 = Moderately severe depression, 20-27 = Severe depression   Psychosocial Evaluation and Intervention:   Psychosocial Re-Evaluation:   Psychosocial Discharge (Final Psychosocial Re-Evaluation):   Vocational Rehabilitation: Provide vocational rehab assistance to qualifying candidates.   Vocational Rehab Evaluation & Intervention: Vocational Rehab - 06/10/18 1149    Initial Vocational Rehab Evaluation & Intervention          Assessment shows need for Vocational Rehabilitation  --   not completed in [facility] packet           Education: Education Goals: Education classes will be provided on a weekly basis, covering required topics. Participant will state understanding/return demonstration of topics presented.  Learning Barriers/Preferences: Learning Barriers/Preferences - 06/10/18 1050    Learning Barriers/Preferences          Learning Barriers  Hearing;Sight    Learning Preferences  Audio;Computer/Internet;Group Instruction;Individual Instruction;Pictoral;Skilled Demonstration;Verbal Instruction;Video;Written Material           Education Topics: Count Your Pulse:  -Group instruction provided by verbal instruction, demonstration, patient participation and written materials to support subject.  Instructors address  importance of being able to find your pulse and how to count your pulse when at home without a heart monitor.  Patients get hands on experience counting their pulse with staff help and individually.   Heart Attack, Angina, and Risk Factor Modification:  -Group instruction provided by verbal instruction, video, and written materials to support subject.  Instructors address signs and symptoms of angina and heart attacks.    Also  discuss risk factors for heart disease and how to make changes to improve heart health risk factors.   Functional Fitness:  -Group instruction provided by verbal instruction, demonstration, patient participation, and written materials to support subject.  Instructors address safety measures for doing things around the house.  Discuss how to get up and down off the floor, how to pick things up properly, how to safely get out of a chair without assistance, and balance training.   Meditation and Mindfulness:  -Group instruction provided by verbal instruction, patient participation, and written materials to support subject.  Instructor addresses importance of mindfulness and meditation practice to help reduce stress and improve awareness.  Instructor also leads participants through a meditation exercise.    Stretching for Flexibility and Mobility:  -Group instruction provided by verbal instruction, patient participation, and written materials to support subject.  Instructors lead participants through series of stretches that are designed to increase flexibility thus improving mobility.  These stretches are additional exercise for major muscle groups that are typically performed during regular warm up and cool down.   Hands Only CPR:  -Group verbal, video, and participation provides a basic overview of AHA guidelines for community CPR. Role-play of emergencies allow participants the opportunity to practice calling for help and chest compression technique with discussion of AED use.   Hypertension: -Group verbal and written instruction that provides a basic overview of hypertension including the most recent diagnostic guidelines, risk factor reduction with self-care instructions and medication management.    Nutrition I class: Heart Healthy Eating:  -Group instruction provided by PowerPoint slides, verbal discussion, and written materials to support subject matter. The instructor gives an  explanation and review of the Therapeutic Lifestyle Changes diet recommendations, which includes a discussion on lipid goals, dietary fat, sodium, fiber, plant stanol/sterol esters, sugar, and the components of a well-balanced, healthy diet.   Nutrition II class: Lifestyle Skills:  -Group instruction provided by PowerPoint slides, verbal discussion, and written materials to support subject matter. The instructor gives an explanation and review of label reading, grocery shopping for heart health, heart healthy recipe modifications, and ways to make healthier choices when eating out.   Diabetes Question & Answer:  -Group instruction provided by PowerPoint slides, verbal discussion, and written materials to support subject matter. The instructor gives an explanation and review of diabetes co-morbidities, pre- and post-prandial blood glucose goals, pre-exercise blood glucose goals, signs, symptoms, and treatment of hypoglycemia and hyperglycemia, and foot care basics.   Diabetes Blitz:  -Group instruction provided by PowerPoint slides, verbal discussion, and written materials to support subject matter. The instructor gives an explanation and review of the physiology behind type 1 and type 2 diabetes, diabetes medications and rational behind using different medications, pre- and post-prandial blood glucose recommendations and Hemoglobin A1c goals, diabetes diet, and exercise including blood glucose guidelines for exercising safely.    Portion Distortion:  -Group instruction provided by PowerPoint slides, verbal discussion, written materials, and food models to support subject matter. The instructor gives an explanation of serving size versus portion size, changes in  portions sizes over the last 20 years, and what consists of a serving from each food group.   Stress Management:  -Group instruction provided by verbal instruction, video, and written materials to support subject matter.  Instructors  review role of stress in heart disease and how to cope with stress positively.     Exercising on Your Own:  -Group instruction provided by verbal instruction, power point, and written materials to support subject.  Instructors discuss benefits of exercise, components of exercise, frequency and intensity of exercise, and end points for exercise.  Also discuss use of nitroglycerin and activating EMS.  Review options of places to exercise outside of rehab.  Review guidelines for sex with heart disease.   Cardiac Drugs I:  -Group instruction provided by verbal instruction and written materials to support subject.  Instructor reviews cardiac drug classes: antiplatelets, anticoagulants, beta blockers, and statins.  Instructor discusses reasons, side effects, and lifestyle considerations for each drug class.   Cardiac Drugs II:  -Group instruction provided by verbal instruction and written materials to support subject.  Instructor reviews cardiac drug classes: angiotensin converting enzyme inhibitors (ACE-I), angiotensin II receptor blockers (ARBs), nitrates, and calcium channel blockers.  Instructor discusses reasons, side effects, and lifestyle considerations for each drug class.   Anatomy and Physiology of the Circulatory System:  Group verbal and written instruction and models provide basic cardiac anatomy and physiology, with the coronary electrical and arterial systems. Review of: AMI, Angina, Valve disease, Heart Failure, Peripheral Artery Disease, Cardiac Arrhythmia, Pacemakers, and the ICD.   Other Education:  -Group or individual verbal, written, or video instructions that support the educational goals of the cardiac rehab program.   Holiday Eating Survival Tips:  -Group instruction provided by PowerPoint slides, verbal discussion, and written materials to support subject matter. The instructor gives patients tips, tricks, and techniques to help them not only survive but enjoy the holidays  despite the onslaught of food that accompanies the holidays.   Knowledge Questionnaire Score: Knowledge Questionnaire Score - 06/10/18 1050    Knowledge Questionnaire Score          Pre Score  19/24           Core Components/Risk Factors/Patient Goals at Admission: Personal Goals and Risk Factors at Admission - 06/10/18 1051    Core Components/Risk Factors/Patient Goals on Admission           Weight Management  Yes;Weight Maintenance;Weight Loss    Admit Weight  197 lb 12 oz (89.7 kg)    Expected Outcomes  Short Term: Continue to assess and modify interventions until short term weight is achieved;Long Term: Adherence to nutrition and physical activity/exercise program aimed toward attainment of established weight goal;Weight Maintenance: Understanding of the daily nutrition guidelines, which includes 25-35% calories from fat, 7% or less cal from saturated fats, less than 200mg  cholesterol, less than 1.5gm of sodium, & 5 or more servings of fruits and vegetables daily;Weight Loss: Understanding of general recommendations for a balanced deficit meal plan, which promotes 1-2 lb weight loss per week and includes a negative energy balance of [phone] kcal/d;Understanding recommendations for meals to include 15-35% energy as protein, 25-35% energy from fat, 35-60% energy from carbohydrates, less than 200mg  of dietary cholesterol, 20-35 gm of total fiber daily;Understanding of distribution of calorie intake throughout the day with the consumption of 4-5 meals/snacks    Diabetes  Yes    Intervention  Provide education about signs/symptoms and action to take for hypo/hyperglycemia.;Provide education about proper nutrition,  including hydration, and aerobic/resistive exercise prescription along with prescribed medications to achieve blood glucose in normal ranges: Fasting glucose 65-99 mg/dL    Expected Outcomes  Short Term: Participant verbalizes understanding of the signs/symptoms and immediate care of  hyper/hypoglycemia, proper foot care and importance of medication, aerobic/resistive exercise and nutrition plan for blood glucose control.;Long Term: Attainment of HbA1C < 7%.    Hypertension  Yes    Intervention  Provide education on lifestyle modifcations including regular physical activity/exercise, weight management, moderate sodium restriction and increased consumption of fresh fruit, vegetables, and low fat dairy, alcohol moderation, and smoking cessation.;Monitor prescription use compliance.    Expected Outcomes  Short Term: Continued assessment and intervention until BP is < 140/47mm HG in hypertensive participants. < 130/64mm HG in hypertensive participants with diabetes, heart failure or chronic kidney disease.;Long Term: Maintenance of blood pressure at goal levels.    Lipids  Yes    Intervention  Provide education and support for participant on nutrition & aerobic/resistive exercise along with prescribed medications to achieve LDL 70mg , HDL >40mg .    Expected Outcomes  Short Term: Participant states understanding of desired cholesterol values and is compliant with medications prescribed. Participant is following exercise prescription and nutrition guidelines.;Long Term: Cholesterol controlled with medications as prescribed, with individualized exercise RX and with personalized nutrition plan. Value goals: LDL < 70mg , HDL > 40 mg.    Stress  Yes    Intervention  Offer individual and/or small group education and counseling on adjustment to heart disease, stress management and health-related lifestyle change. Teach and support self-help strategies.;Refer participants experiencing significant psychosocial distress to appropriate mental health specialists for further evaluation and treatment. When possible, include family members and significant others in education/counseling sessions.    Expected Outcomes  Short Term: Participant demonstrates changes in health-related behavior, relaxation and  other stress management skills, ability to obtain effective social support, and compliance with psychotropic medications if prescribed.;Long Term: Emotional wellbeing is indicated by absence of clinically significant psychosocial distress or social isolation.           Core Components/Risk Factors/Patient Goals Review:    Core Components/Risk Factors/Patient Goals at Discharge (Final Review):    ITP Comments: ITP Comments    Row Name 06/10/18 0851   ITP Comments  Medical Director- Dr. Fransico Him, MD      Comments: Patient attended orientation from 0803 to 1017  to review rules and guidelines for program. Completed 6 minute walk test, Intitial ITP, and exercise prescription.  VSS. Telemetry-sinus rhythm, frequent PVC.   Asymptomatic.  Andi Hence, RN, BSN Cardiac Pulmonary Rehab 06/10/18 11:58 AM

## 2018-06-18 ENCOUNTER — Telehealth: Payer: Self-pay | Admitting: Internal Medicine

## 2018-06-18 ENCOUNTER — Other Ambulatory Visit: Payer: Self-pay | Admitting: Cardiothoracic Surgery

## 2018-06-18 ENCOUNTER — Ambulatory Visit (HOSPITAL_COMMUNITY): Payer: Medicare Other

## 2018-06-18 ENCOUNTER — Encounter (HOSPITAL_COMMUNITY)
Admission: RE | Admit: 2018-06-18 | Discharge: 2018-06-18 | Disposition: A | Discharge status: Home / Self Care | Payer: Medicare Other | Source: Ambulatory Visit | Attending: Cardiology | Admitting: Cardiology

## 2018-06-18 DIAGNOSIS — Z951 Presence of aortocoronary bypass graft: Secondary | ICD-10-CM

## 2018-06-18 DIAGNOSIS — R918 Other nonspecific abnormal finding of lung field: Secondary | ICD-10-CM

## 2018-06-18 LAB — GLUCOSE, CAPILLARY
Glucose-Capillary: 135 mg/dL — ABNORMAL HIGH (ref 70–99)
Glucose-Capillary: 139 mg/dL — ABNORMAL HIGH (ref 70–99)

## 2018-06-18 NOTE — Telephone Encounter (Signed)
Previous phone note, Canceled records belong to Dr. Cathlean Cower

## 2018-06-18 NOTE — Telephone Encounter (Signed)
Rec'd from [facility] forwarded 5 pages to Dr. Scarlette Calico

## 2018-06-18 NOTE — Progress Notes (Signed)
Daily Session Note  Patient Details  Name: Nathan Medina MRN: [id number] Date of Birth: 26-Jun-1950 Referring Provider:     CARDIAC REHAB PHASE II ORIENTATION from 06/10/2018 in [place]  Referring Provider  Dr. Virgina Jock      Encounter Date: 06/18/2018  Check In: Session Check In - 06/18/18 1148      Check-In   Supervising physician immediately available to respond to emergencies  [facility] immediately available    Physician(s)  Dr. Waldron Labs    Location  [facility]    Staff Present  Barnet Pall, RN, BSN;Brittany Durene Fruits, BS, ACSM CEP, Exercise Physiologist;Tyara Carol Ada, MS,ACSM CEP, Exercise Physiologist;Dalton Kris Mouton, MS, Exercise Physiologist;Azir Muzyka Karle Starch, RN, BSN    Medication changes reported      No    Fall or balance concerns reported     No    Tobacco Cessation  No Change    Warm-up and Cool-down  Performed as group-led instruction    Resistance Training Performed  No    VAD Patient?  No    PAD/SET Patient?  No      Pain Assessment   Currently in Pain?  No/denies    Multiple Pain Sites  No       Capillary Blood Glucose: Results for orders placed or performed during the hospital encounter of 06/18/18 (from the past 24 hour(s))  Glucose, capillary     Status: Abnormal   Collection Time: 06/18/18 11:26 AM  Result Value Ref Range   Glucose-Capillary 135 (H) 70 - 99 mg/dL  Glucose, capillary     Status: Abnormal   Collection Time: 06/18/18 12:12 PM  Result Value Ref Range   Glucose-Capillary 139 (H) 70 - 99 mg/dL    Exercise Prescription Changes - 06/18/18 1300      Response to Exercise   Blood Pressure (Admit)  102/64    Blood Pressure (Exercise)  122/68    Blood Pressure (Exit)  104/64    Heart Rate (Admit)  72 bpm    Heart Rate (Exercise)  87 bpm    Heart Rate (Exit)  72 bpm    Rating of Perceived Exertion (Exercise)  12    Perceived Dyspnea (Exercise)  0    Symptoms  None    Comments  Pt  oriented to exercise equipment    Duration  Progress to 30 minutes of  aerobic without signs/symptoms of physical distress    Intensity  THRR unchanged      Progression   Progression  Continue to progress workloads to maintain intensity without signs/symptoms of physical distress.    Average METs  2.39      Resistance Training   Training Prescription  No      Interval Training   Interval Training  No      NuStep   Level  2    SPM  70    Minutes  10    METs  1.5      Arm Ergometer   Level  1    Watts  30    Minutes  10    METs  2.75      Track   Laps  11    Minutes  10    METs  2.91       Social History   Tobacco Use  Smoking Status Never Smoker  Smokeless Tobacco Never Used    Goals Met:  Exercise tolerated well  Goals Unmet:  Not Applicable  Comments: Pt started cardiac rehab today.  Pt tolerated light exercise without difficulty. VSS, telemetry-SR with frequent PVC's, asymptomatic.  Medication list reconciled. Pt denies barriers to medicaiton compliance.  PSYCHOSOCIAL ASSESSMENT:  PHQ-1. Pt reports ability to manage his chronic depression with supportive family. No psychosocial needs identified at this time, no psychosocial interventions necessary.   Pt oriented to exercise equipment and routine.    Understanding verbalized.    Dr. Fransico Him is Medical Director for Cardiac Rehab at [facility].

## 2018-06-18 NOTE — Telephone Encounter (Signed)
Rec'd from [facility] forwarded 4 pages to Dr. Cathlean Cower

## 2018-06-20 ENCOUNTER — Encounter (HOSPITAL_COMMUNITY)
Admission: RE | Admit: 2018-06-20 | Discharge: 2018-06-20 | Disposition: A | Discharge status: Home / Self Care | Payer: Medicare Other | Source: Ambulatory Visit | Attending: Cardiology | Admitting: Cardiology

## 2018-06-20 ENCOUNTER — Ambulatory Visit (HOSPITAL_COMMUNITY): Payer: Medicare Other

## 2018-06-20 DIAGNOSIS — Z951 Presence of aortocoronary bypass graft: Secondary | ICD-10-CM | POA: Diagnosis not present

## 2018-06-20 LAB — GLUCOSE, CAPILLARY: Glucose-Capillary: 159 mg/dL — ABNORMAL HIGH (ref 70–99)

## 2018-06-23 ENCOUNTER — Encounter (HOSPITAL_COMMUNITY)
Admission: RE | Admit: 2018-06-23 | Discharge: 2018-06-23 | Disposition: A | Discharge status: Home / Self Care | Payer: Medicare Other | Source: Ambulatory Visit | Attending: Cardiology | Admitting: Cardiology

## 2018-06-23 ENCOUNTER — Ambulatory Visit (HOSPITAL_COMMUNITY): Payer: Medicare Other

## 2018-06-23 DIAGNOSIS — Z951 Presence of aortocoronary bypass graft: Secondary | ICD-10-CM | POA: Diagnosis not present

## 2018-06-25 ENCOUNTER — Encounter (HOSPITAL_COMMUNITY): Payer: Medicare Other

## 2018-06-25 ENCOUNTER — Ambulatory Visit (HOSPITAL_COMMUNITY): Payer: Medicare Other

## 2018-06-25 ENCOUNTER — Telehealth (HOSPITAL_COMMUNITY): Payer: Self-pay | Admitting: Internal Medicine

## 2018-06-26 NOTE — Progress Notes (Signed)
Cardiac Individual Treatment Plan  Patient Details  Name: Nathan Medina MRN: [id number] Date of Birth: [birth year] Referring Provider:     [place] from 06/10/2018 in [place]  Referring Provider  Dr. 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  acetaminophen (TYLENOL) 325 MG tablet, Take 2 tablets (650 mg total) by mouth every 6 (six) hours as needed for mild pain (or Fever >/= 101)., Disp: 20 tablet, Rfl: 0 .  allopurinol (ZYLOPRIM) 300 MG tablet, TAKE 1 TABLET BY MOUTH EVERY DAY (Patient taking differently: Take 300 mg by mouth daily. ), Disp: 90 tablet, Rfl: 0 .  ALPRAZolam (XANAX) 0.5 MG tablet, Take 1 tablet (0.5 mg total) by mouth daily as needed for sleep. 1/2 - 1 by mouth once daily as needed (Patient taking differently: Take 0.25-0.5 mg by mouth at bedtime as needed for sleep. ), Disp: 90 tablet, Rfl: 2 .  aspirin EC 81 MG tablet, Take 81 mg by mouth daily., Disp: , Rfl:  .  aspirin-acetaminophen-caffeine (EXCEDRIN EXTRA STRENGTH) 250-250-65 MG tablet, Take 1-2 tablets by mouth every 6 (six) hours as needed for headache., Disp: , Rfl:  .  atorvastatin (LIPITOR) 10 MG tablet, Take 10 mg by mouth at bedtime., Disp: , Rfl:  .  calcium carbonate (TUMS) 500 MG chewable tablet, Chew 2 tablets (400 mg of elemental calcium total) by mouth 2 (two) times daily. (Patient taking differently: Chew 2 tablets by mouth 2 (two) times daily as needed for indigestion. ), Disp: 90 tablet, Rfl: 1 .  carvedilol (COREG) 3.125 MG tablet, Take 3.125 mg by mouth 2 (two) times daily with a meal., Disp: , Rfl:  .  Cholecalciferol (VITAMIN D-3) 5000 units TABS, Take 5,000 Units by mouth at bedtime., Disp: , Rfl:  .   cyclobenzaprine (FLEXERIL) 5 MG tablet, Take 5 mg by mouth 3 (three) times daily as needed for muscle spasms., Disp: , Rfl:  .  diphenhydrAMINE (BENADRYL) 25 MG tablet, Take 25 mg by mouth at bedtime as needed for sleep., Disp: , Rfl:  .  furosemide (LASIX) 40 MG tablet, Take 120 mg by mouth daily. , Disp: , Rfl:  .  Guaifenesin 1200 MG TB12, Take 1,200 mg by mouth 2 (two) times daily as needed (cough)., Disp: , Rfl:  .  insulin glargine (LANTUS) 100 unit/mL SOPN, Inject 40 Units into the skin at bedtime. , Disp: , Rfl:  .  levothyroxine (SYNTHROID, LEVOTHROID) 175 MCG tablet, Take 175 mcg by mouth daily before breakfast., Disp: , Rfl:  .  Multiple Vitamins-Minerals (PRESERVISION AREDS 2 PO), Take 1 tablet by mouth 2 (two) times daily., Disp: , Rfl:  .  omega-3 acid ethyl esters (LOVAZA) 1 g capsule, Take 2 g by mouth 2 (two) times daily. , Disp: , Rfl:  .  omeprazole (PRILOSEC) 20 MG capsule, Take 20 mg by mouth daily before breakfast. , Disp: , Rfl:  .  rivaroxaban (XARELTO) 2.5 MG TABS tablet, Take 2.5 mg by mouth 2 (two) times daily., Disp: , Rfl:  .  sacubitril-valsartan (ENTRESTO) 24-26 MG, Take 1 tablet by mouth 2 (two) times daily., Disp: , Rfl:  .  sildenafil (REVATIO) 20 MG tablet, Take 100 mg by mouth  daily as needed. , Disp: , Rfl:  .  spironolactone (ALDACTONE) 25 MG tablet, Take 25 mg by mouth daily., Disp: , Rfl:  .  traMADol (ULTRAM) 50 MG tablet, Take 1-2 tablets (50-100 mg total) by mouth every 6 (six) hours as needed for moderate pain., Disp: 24 tablet, Rfl: 0 .  triamcinolone cream (KENALOG) 0.1 %, Apply 1 application topically 2 (two) times daily as needed (skin irritation)., Disp: , Rfl:  .  vitamin E (VITAMIN E) 1000 UNIT capsule, Take 1,000 Units by mouth at bedtime., Disp: , Rfl:   Past Medical History: Past Medical History:  Diagnosis Date  . Abnormal liver function test 05/23/2011  . ALLERGIC RHINITIS   . ANXIETY   . Aortic atherosclerosis ([place])   . Arthritis   .  Bilateral renal cysts   . Cervical spondylolysis    Moderate  . Chronic kidney disease    CKD stage 3 per office visit note 08/08/17 on chart   . COLONIC POLYPS, HX OF   . Coronary artery disease   . DEPRESSION   . DIABETES MELLITUS, TYPE II   . Diverticulitis   . Epidermal cyst   . Fatty liver   . GERD   . GOUT   . History of inguinal hernia   . HOH (hard of hearing)    elft ear  . HYPERLIPIDEMIA   . HYPERTENSION   . IBS   . 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 Left renal mass 05/23/2011  . Lumbar radicular pain 06/03/2011  . Macular degeneration    right eye  . Multinodular goiter   . Pancreatic pseudocyst    Stable  . Peritonsillar abscess   . Pulmonary nodule    Right upper lobe  . Radial neck fracture   . Shortness of breath    occasional shortness of breath  with exertion  . Thyroid disease   . Thyroid nodule    Bilateral  . Vertigo     Tobacco Use: Social History   Tobacco Use  Smoking Status Never Smoker  Smokeless Tobacco Never Used    Labs: Recent Review Flowsheet Data    Labs for ITP Cardiac and Pulmonary Rehab Latest Ref Rng & Units 04/01/2018 04/01/2018 04/01/2018 04/02/2018 04/04/2018   Cholestrol 0 - 200 mg/dL - - - - -   LDLCALC 0 - 99 mg/dL - - - - -   LDLDIRECT mg/dL - - - - -   HDL >39.00 mg/dL - - - - -   Trlycerides 0.0 - 149.0 mg/dL - - - - -   Hemoglobin A1c 4.8 - 5.6 % - - - - -   PHART 7.350 - 7.450 7.325(L) - 7.372 - -   PCO2ART 32.0 - 48.0 mmHg 24.3(L) - 40.2 - -   HCO3 20.0 - 28.0 mmol/L 12.7(L) - 23.2 - -   TCO2 22 - 32 mmol/L 13(L) 24 24 26  -   ACIDBASEDEF 0.0 - 2.0 mmol/L 12.0(H) - 2.0 - -   O2SAT % 97.0 - 89.0 - 69.6      Capillary Blood Glucose: Lab Results  Component Value Date   GLUCAP 159 (H) 06/20/2018   GLUCAP 139 (H) 06/18/2018   GLUCAP 135 (H) 06/18/2018   GLUCAP 164 (H) 04/09/2018   GLUCAP 126 (H) 04/09/2018      Exercise Target Goals: Exercise Program Goal: Individual exercise prescription set using results from initial 6 min walk test  and THRR while considering  patient's activity barriers and safety.   Exercise Prescription Goal: Initial exercise prescription builds to 30-45 minutes a day of aerobic activity, 2-3 days per week.  Home exercise guidelines will be given to patient during program as part of exercise prescription that the participant will acknowledge.  Activity Barriers & Risk Stratification: Activity Barriers & Cardiac Risk Stratification - 06/10/18 1038      Activity Barriers & Cardiac Risk Stratification   Activity Barriers  Arthritis;Joint Problems    Cardiac Risk Stratification  High       6 Minute Walk: 6 Minute Walk    Row Name 06/10/18 1037         6 Minute Walk   Phase  Initial     Distance  1157 feet     Walk Time  6 minutes     # of Rest Breaks  0     MPH  2.19     METS  2.07     RPE  12     VO2 Peak  7.25     Symptoms  No     Resting HR  72 bpm     Resting BP  108/58     Resting Oxygen Saturation   96 %     Exercise Oxygen Saturation  during 6 min walk  97 %     Max Ex. HR  64 bpm     Max Ex. BP  102/54     2 Minute Post BP  102/78        Oxygen Initial Assessment:   Oxygen Re-Evaluation:   Oxygen Discharge (Final Oxygen Re-Evaluation):   Initial Exercise Prescription: Initial Exercise Prescription - 06/10/18 1100      Date of Initial Exercise RX and Referring Provider   Date  06/10/18    Referring Provider  Dr. Virgina Jock    Expected Discharge Date  09/15/18      NuStep   Level  2    SPM  75    Minutes  10    METs  2      Arm Ergometer   Level  1    Watts  30    Minutes  10    METs  2.75      Track   Laps  7    Minutes  10    METs  2.23      Prescription Details   Frequency (times per week)  3    Duration  Progress to 30 minutes of continuous aerobic without signs/symptoms of physical distress      Intensity    THRR 40-80% of Max Heartrate  61-122    Ratings of Perceived Exertion  11-13      Progression   Progression  Continue to progress workloads to maintain intensity without signs/symptoms of physical distress.      Resistance Training   Training Prescription  Yes    Weight  2 lbs.     Reps  10-15       Perform Capillary Blood Glucose checks as needed.  Exercise Prescription Changes: Exercise Prescription Changes    Row Name 06/18/18 1300             Response to Exercise   Blood Pressure (Admit)  102/64       Blood Pressure (Exercise)  122/68       Blood Pressure (Exit)  104/64       Heart Rate (Admit)  72 bpm  Heart Rate (Exercise)  87 bpm       Heart Rate (Exit)  72 bpm       Rating of Perceived Exertion (Exercise)  12       Perceived Dyspnea (Exercise)  0       Symptoms  None       Comments  Pt oriented to exercise equipment       Duration  Progress to 30 minutes of  aerobic without signs/symptoms of physical distress       Intensity  THRR unchanged         Progression   Progression  Continue to progress workloads to maintain intensity without signs/symptoms of physical distress.       Average METs  2.39         Resistance Training   Training Prescription  No         Interval Training   Interval Training  No         NuStep   Level  2       SPM  70       Minutes  10       METs  1.5         Arm Ergometer   Level  1       Watts  30       Minutes  10       METs  2.75         Track   Laps  11       Minutes  10       METs  2.91          Exercise Comments: Exercise Comments    Row Name 06/18/18 1336           Exercise Comments  Pt's first day of exercise. Pt responded well to workloads. Pt oriented to exercise prescriptions. Will continue to monitor and progress pt as tolerated.           Exercise Goals and Review: Exercise Goals    Row Name 06/10/18 0900             Exercise Goals   Increase Physical Activity  Yes       Intervention   Provide advice, education, support and counseling about physical activity/exercise needs.;Develop an individualized exercise prescription for aerobic and resistive training based on initial evaluation findings, risk stratification, comorbidities and participant's personal goals.       Expected Outcomes  Short Term: Attend rehab on a regular basis to increase amount of physical activity.;Long Term: Add in home exercise to make exercise part of routine and to increase amount of physical activity.;Long Term: Exercising regularly at least 3-5 days a week.       Increase Strength and Stamina  Yes       Intervention  Provide advice, education, support and counseling about physical activity/exercise needs.;Develop an individualized exercise prescription for aerobic and resistive training based on initial evaluation findings, risk stratification, comorbidities and participant's personal goals.       Expected Outcomes  Short Term: Increase workloads from initial exercise prescription for resistance, speed, and METs.;Short Term: Perform resistance training exercises routinely during rehab and add in resistance training at home;Long Term: Improve cardiorespiratory fitness, muscular endurance and strength as measured by increased METs and functional capacity (6MWT)       Able to understand and use rate of perceived exertion (RPE) scale  Yes       Intervention  Provide  education and explanation on how to use RPE scale       Expected Outcomes  Short Term: Able to use RPE daily in rehab to express subjective intensity level;Long Term:  Able to use RPE to guide intensity level when exercising independently       Knowledge and understanding of Target Heart Rate Range (THRR)  Yes       Intervention  Provide education and explanation of THRR including how the numbers were predicted and where they are located for reference       Expected Outcomes  Short Term: Able to state/look up THRR;Long Term: Able to use THRR to govern  intensity when exercising independently;Short Term: Able to use daily as guideline for intensity in rehab       Able to check pulse independently  Yes       Intervention  Provide education and demonstration on how to check pulse in carotid and radial arteries.;Review the importance of being able to check your own pulse for safety during independent exercise       Expected Outcomes  Short Term: Able to explain why pulse checking is important during independent exercise;Long Term: Able to check pulse independently and accurately       Understanding of Exercise Prescription  Yes       Intervention  Provide education, explanation, and written materials on patient's individual exercise prescription       Expected Outcomes  Short Term: Able to explain program exercise prescription;Long Term: Able to explain home exercise prescription to exercise independently          Exercise Goals Re-Evaluation :   Discharge Exercise Prescription (Final Exercise Prescription Changes): Exercise Prescription Changes - 06/18/18 1300      Response to Exercise   Blood Pressure (Admit)  102/64    Blood Pressure (Exercise)  122/68    Blood Pressure (Exit)  104/64    Heart Rate (Admit)  72 bpm    Heart Rate (Exercise)  87 bpm    Heart Rate (Exit)  72 bpm    Rating of Perceived Exertion (Exercise)  12    Perceived Dyspnea (Exercise)  0    Symptoms  None    Comments  Pt oriented to exercise equipment    Duration  Progress to 30 minutes of  aerobic without signs/symptoms of physical distress    Intensity  THRR unchanged      Progression   Progression  Continue to progress workloads to maintain intensity without signs/symptoms of physical distress.    Average METs  2.39      Resistance Training   Training Prescription  No      Interval Training   Interval Training  No      NuStep   Level  2    SPM  70    Minutes  10    METs  1.5      Arm Ergometer   Level  1    Watts  30    Minutes  10    METs  2.75       Track   Laps  11    Minutes  10    METs  2.91       Nutrition:  Target Goals: Understanding of nutrition guidelines, daily intake of sodium 1500mg , cholesterol 200mg , calories 30% from fat and 7% or less from saturated fats, daily to have 5 or more servings of fruits and vegetables.  Biometrics: Pre Biometrics - 06/10/18 1048  Pre Biometrics   Height  5\' 7"  (1.702 m)    Weight  89.7 kg    Waist Circumference  41 inches    Hip Circumference  41.75 inches    Waist to Hip Ratio  0.98 %    BMI (Calculated)  30.97    Triceps Skinfold  15.5 mm    % Body Fat  29.5 %    Grip Strength  35.5 kg    Flexibility  0 in    Single Leg Stand  1.56 seconds        Nutrition Therapy Plan and Nutrition Goals: Nutrition Therapy & Goals - 06/10/18 1559      Nutrition Therapy   Diet  heart healthy, carb modified      Personal Nutrition Goals   Nutrition Goal  Pt to identify and limit food sources of saturated fat, trans fat, refined carbohydrates and sodium    Personal Goal #2  Pt to identify food quantities necessary to achieve weight loss of 6-24 lbs. at graduation from cardiac rehab    Personal Goal #3  Pt to eat a variety of non-starchy vegetables      Intervention Plan   Intervention  Prescribe, educate and counsel regarding individualized specific dietary modifications aiming towards targeted core components such as weight, hypertension, lipid management, diabetes, heart failure and other comorbidities.    Expected Outcomes  Short Term Goal: Understand basic principles of dietary content, such as calories, fat, sodium, cholesterol and nutrients.;Long Term Goal: Adherence to prescribed nutrition plan.       Nutrition Assessments: Nutrition Assessments - 06/10/18 1608      MEDFICTS Scores   Pre Score  39       Nutrition Goals Re-Evaluation: Nutrition Goals Re-Evaluation    Row Name 06/10/18 1559             Goals   Current Weight  197 lb 12 oz (89.7 kg)           Nutrition Goals Re-Evaluation: Nutrition Goals Re-Evaluation    [place] Name 06/10/18 1559             Goals   Current Weight  197 lb 12 oz (89.7 kg)          Nutrition Goals Discharge (Final Nutrition Goals Re-Evaluation): Nutrition Goals Re-Evaluation - 06/10/18 1559      Goals   Current Weight  197 lb 12 oz (89.7 kg)       Psychosocial: Target Goals: Acknowledge presence or absence of significant depression and/or stress, maximize coping skills, provide positive support system. Participant is able to verbalize types and ability to use techniques and skills needed for reducing stress and depression.  Initial Review & Psychosocial Screening: Initial Psych Review & Screening - 06/10/18 1140      Initial Review   Current issues with  None Identified      Family Dynamics   Good Support System?  Yes   family/friends      Barriers   Psychosocial barriers to participate in program  There are no identifiable barriers or psychosocial needs.      Screening Interventions   Interventions  Encouraged to exercise       Quality of Life Scores: Quality of Life - 06/10/18 1103      Quality of Life   Select  Quality of Life      Quality of Life Scores   Health/Function Pre  20.7 %    Socioeconomic Pre  26.8 %  Psych/Spiritual Pre  23.7 %    Family Pre  30 %    GLOBAL Pre  24 %      Scores of 19 and below usually indicate a poorer quality of life in these areas.  A difference of  2-3 points is a clinically meaningful difference.  A difference of 2-3 points in the total score of the Quality of Life Index has been associated with significant improvement in overall quality of life, self-image, physical symptoms, and general health in studies assessing change in quality of life.  PHQ-9: Recent Review Flowsheet Data    Depression screen [facility] 2/9 06/18/2018   Decreased Interest 0   Down, Depressed, Hopeless 1   PHQ - 2 Score 1     Interpretation of Total Score  Total  Score Depression Severity:  1-4 = Minimal depression, 5-9 = Mild depression, 10-14 = Moderate depression, 15-19 = Moderately severe depression, 20-27 = Severe depression   Psychosocial Evaluation and Intervention: Psychosocial Evaluation - 06/18/18 1521      Psychosocial Evaluation & Interventions   Interventions  Stress management education;Encouraged to exercise with the program and follow exercise prescription;Relaxation education    Comments  Nathan Medina has a history of depression but reports that he is managing it well at the moment.      Expected Outcomes  Nathan Medina will report continued ability to manage his depression.     Continue Psychosocial Services   Follow up required by staff       Psychosocial Re-Evaluation: Psychosocial Re-Evaluation    [place] Name 06/26/18 1400             Psychosocial Re-Evaluation   Current issues with  History of Depression       Comments  No intervention necessay.        Expected Outcomes  Nathan Medina will continue to manage his depression.       Interventions  Stress management education;Relaxation education;Encouraged to attend Cardiac Rehabilitation for the exercise       Continue Psychosocial Services   Follow up required by staff          Psychosocial Discharge (Final Psychosocial Re-Evaluation): Psychosocial Re-Evaluation - 06/26/18 1400      Psychosocial Re-Evaluation   Current issues with  History of Depression    Comments  No intervention necessay.     Expected Outcomes  Nathan Medina will continue to manage his depression.    Interventions  Stress management education;Relaxation education;Encouraged to attend Cardiac Rehabilitation for the exercise    Continue Psychosocial Services   Follow up required by staff       Vocational Rehabilitation: Provide vocational rehab assistance to qualifying candidates.   Vocational Rehab Evaluation & Intervention: Vocational Rehab - 06/18/18 1522      Initial Vocational Rehab Evaluation & Intervention    Assessment shows need for Vocational Rehabilitation  No       Education: Education Goals: Education classes will be provided on a weekly basis, covering required topics. Participant will state understanding/return demonstration of topics presented.  Learning Barriers/Preferences: Learning Barriers/Preferences - 06/10/18 1050      Learning Barriers/Preferences   Learning Barriers  Hearing;Sight    Learning Preferences  Audio;Computer/Internet;Group Instruction;Individual Instruction;Pictoral;Skilled Demonstration;Verbal Instruction;Video;Written Material       Education Topics: Count Your Pulse:  -Group instruction provided by verbal instruction, demonstration, patient participation and written materials to support subject.  Instructors address importance of being able to find your pulse and how to count your pulse when at home without  a heart monitor.  Patients get hands on experience counting their pulse with staff help and individually.   Heart Attack, Angina, and Risk Factor Modification:  -Group instruction provided by verbal instruction, video, and written materials to support subject.  Instructors address signs and symptoms of angina and heart attacks.    Also discuss risk factors for heart disease and how to make changes to improve heart health risk factors.   Functional Fitness:  -Group instruction provided by verbal instruction, demonstration, patient participation, and written materials to support subject.  Instructors address safety measures for doing things around the house.  Discuss how to get up and down off the floor, how to pick things up properly, how to safely get out of a chair without assistance, and balance training.   Meditation and Mindfulness:  -Group instruction provided by verbal instruction, patient participation, and written materials to support subject.  Instructor addresses importance of mindfulness and meditation practice to help reduce stress and improve  awareness.  Instructor also leads participants through a meditation exercise.    Stretching for Flexibility and Mobility:  -Group instruction provided by verbal instruction, patient participation, and written materials to support subject.  Instructors lead participants through series of stretches that are designed to increase flexibility thus improving mobility.  These stretches are additional exercise for major muscle groups that are typically performed during regular warm up and cool down.   Hands Only CPR:  -Group verbal, video, and participation provides a basic overview of AHA guidelines for community CPR. Role-play of emergencies allow participants the opportunity to practice calling for help and chest compression technique with discussion of AED use.   Hypertension: -Group verbal and written instruction that provides a basic overview of hypertension including the most recent diagnostic guidelines, risk factor reduction with self-care instructions and medication management.    Nutrition I class: Heart Healthy Eating:  -Group instruction provided by PowerPoint slides, verbal discussion, and written materials to support subject matter. The instructor gives an explanation and review of the Therapeutic Lifestyle Changes diet recommendations, which includes a discussion on lipid goals, dietary fat, sodium, fiber, plant stanol/sterol esters, sugar, and the components of a well-balanced, healthy diet.   Nutrition II class: Lifestyle Skills:  -Group instruction provided by PowerPoint slides, verbal discussion, and written materials to support subject matter. The instructor gives an explanation and review of label reading, grocery shopping for heart health, heart healthy recipe modifications, and ways to make healthier choices when eating out.   Diabetes Question & Answer:  -Group instruction provided by PowerPoint slides, verbal discussion, and written materials to support subject matter. The  instructor gives an explanation and review of diabetes co-morbidities, pre- and post-prandial blood glucose goals, pre-exercise blood glucose goals, signs, symptoms, and treatment of hypoglycemia and hyperglycemia, and foot care basics.   Diabetes Blitz:  -Group instruction provided by PowerPoint slides, verbal discussion, and written materials to support subject matter. The instructor gives an explanation and review of the physiology behind type 1 and type 2 diabetes, diabetes medications and rational behind using different medications, pre- and post-prandial blood glucose recommendations and Hemoglobin A1c goals, diabetes diet, and exercise including blood glucose guidelines for exercising safely.    Portion Distortion:  -Group instruction provided by PowerPoint slides, verbal discussion, written materials, and food models to support subject matter. The instructor gives an explanation of serving size versus portion size, changes in portions sizes over the last 20 years, and what consists of a serving from each food group.  Stress Management:  -Group instruction provided by verbal instruction, video, and written materials to support subject matter.  Instructors review role of stress in heart disease and how to cope with stress positively.     CARDIAC REHAB PHASE II EXERCISE from 06/18/2018 in [place]  Date  06/18/18  Educator  RN  Instruction Review Code  2- Demonstrated Understanding      Exercising on Your Own:  -Group instruction provided by verbal instruction, power point, and written materials to support subject.  Instructors discuss benefits of exercise, components of exercise, frequency and intensity of exercise, and end points for exercise.  Also discuss use of nitroglycerin and activating EMS.  Review options of places to exercise outside of rehab.  Review guidelines for sex with heart disease.   Cardiac Drugs I:  -Group instruction provided by  verbal instruction and written materials to support subject.  Instructor reviews cardiac drug classes: antiplatelets, anticoagulants, beta blockers, and statins.  Instructor discusses reasons, side effects, and lifestyle considerations for each drug class.   Cardiac Drugs II:  -Group instruction provided by verbal instruction and written materials to support subject.  Instructor reviews cardiac drug classes: angiotensin converting enzyme inhibitors (ACE-I), angiotensin II receptor blockers (ARBs), nitrates, and calcium channel blockers.  Instructor discusses reasons, side effects, and lifestyle considerations for each drug class.   Anatomy and Physiology of the Circulatory System:  Group verbal and written instruction and models provide basic cardiac anatomy and physiology, with the coronary electrical and arterial systems. Review of: AMI, Angina, Valve disease, Heart Failure, Peripheral Artery Disease, Cardiac Arrhythmia, Pacemakers, and the ICD.   Other Education:  -Group or individual verbal, written, or video instructions that support the educational goals of the cardiac rehab program.   Holiday Eating Survival Tips:  -Group instruction provided by PowerPoint slides, verbal discussion, and written materials to support subject matter. The instructor gives patients tips, tricks, and techniques to help them not only survive but enjoy the holidays despite the onslaught of food that accompanies the holidays.   Knowledge Questionnaire Score: Knowledge Questionnaire Score - 06/10/18 1050      Knowledge Questionnaire Score   Pre Score  19/24       Core Components/Risk Factors/Patient Goals at Admission: Personal Goals and Risk Factors at Admission - 06/10/18 1051      Core Components/Risk Factors/Patient Goals on Admission    Weight Management  Yes;Weight Maintenance;Weight Loss    Admit Weight  197 lb 12 oz (89.7 kg)    Expected Outcomes  Short Term: Continue to assess and modify  interventions until short term weight is achieved;Long Term: Adherence to nutrition and physical activity/exercise program aimed toward attainment of established weight goal;Weight Maintenance: Understanding of the daily nutrition guidelines, which includes 25-35% calories from fat, 7% or less cal from saturated fats, less than 200mg  cholesterol, less than 1.5gm of sodium, & 5 or more servings of fruits and vegetables daily;Weight Loss: Understanding of general recommendations for a balanced deficit meal plan, which promotes 1-2 lb weight loss per week and includes a negative energy balance of [phone] kcal/d;Understanding recommendations for meals to include 15-35% energy as protein, 25-35% energy from fat, 35-60% energy from carbohydrates, less than 200mg  of dietary cholesterol, 20-35 gm of total fiber daily;Understanding of distribution of calorie intake throughout the day with the consumption of 4-5 meals/snacks    Diabetes  Yes    Intervention  Provide education about signs/symptoms and action to take for hypo/hyperglycemia.;Provide education about  proper nutrition, including hydration, and aerobic/resistive exercise prescription along with prescribed medications to achieve blood glucose in normal ranges: Fasting glucose 65-99 mg/dL    Expected Outcomes  Short Term: Participant verbalizes understanding of the signs/symptoms and immediate care of hyper/hypoglycemia, proper foot care and importance of medication, aerobic/resistive exercise and nutrition plan for blood glucose control.;Long Term: Attainment of HbA1C < 7%.    Hypertension  Yes    Intervention  Provide education on lifestyle modifcations including regular physical activity/exercise, weight management, moderate sodium restriction and increased consumption of fresh fruit, vegetables, and low fat dairy, alcohol moderation, and smoking cessation.;Monitor prescription use compliance.    Expected Outcomes  Short Term: Continued assessment and  intervention until BP is < 140/2mm HG in hypertensive participants. < 130/59mm HG in hypertensive participants with diabetes, heart failure or chronic kidney disease.;Long Term: Maintenance of blood pressure at goal levels.    Lipids  Yes    Intervention  Provide education and support for participant on nutrition & aerobic/resistive exercise along with prescribed medications to achieve LDL 70mg , HDL >40mg .    Expected Outcomes  Short Term: Participant states understanding of desired cholesterol values and is compliant with medications prescribed. Participant is following exercise prescription and nutrition guidelines.;Long Term: Cholesterol controlled with medications as prescribed, with individualized exercise RX and with personalized nutrition plan. Value goals: LDL < 70mg , HDL > 40 mg.    Stress  Yes    Intervention  Offer individual and/or small group education and counseling on adjustment to heart disease, stress management and health-related lifestyle change. Teach and support self-help strategies.;Refer participants experiencing significant psychosocial distress to appropriate mental health specialists for further evaluation and treatment. When possible, include family members and significant others in education/counseling sessions.    Expected Outcomes  Short Term: Participant demonstrates changes in health-related behavior, relaxation and other stress management skills, ability to obtain effective social support, and compliance with psychotropic medications if prescribed.;Long Term: Emotional wellbeing is indicated by absence of clinically significant psychosocial distress or social isolation.       Core Components/Risk Factors/Patient Goals Review:  Goals and Risk Factor Review    Row Name 06/18/18 1522             Core Components/Risk Factors/Patient Goals Review   Personal Goals Review  Diabetes;Hypertension;Stress;Lipids;Weight Management/Obesity       Review  Pt with multiple CAD  RFs willing to participate in CR exercise.  Nathan Medina would like to increase his stamina        Expected Outcomes  Pt will continue to participate in CR exercise, nutrition, and lifestyle modification opportunities.           Core Components/Risk Factors/Patient Goals at Discharge (Final Review):  Goals and Risk Factor Review - 06/18/18 1522      Core Components/Risk Factors/Patient Goals Review   Personal Goals Review  Diabetes;Hypertension;Stress;Lipids;Weight Management/Obesity    Review  Pt with multiple CAD RFs willing to participate in CR exercise.  Nathan Medina would like to increase his stamina     Expected Outcomes  Pt will continue to participate in CR exercise, nutrition, and lifestyle modification opportunities.        ITP Comments: ITP Comments    Row Name 06/10/18 0851 06/18/18 1520         ITP Comments  Medical Director- Dr. Fransico Him, MD  30 Day ITP Review.  Pt started exercise today and tolerated it well.         Comments: See ITP Comments.

## 2018-06-27 ENCOUNTER — Encounter (HOSPITAL_COMMUNITY)
Admission: RE | Admit: 2018-06-27 | Discharge: 2018-06-27 | Disposition: A | Discharge status: Home / Self Care | Payer: Medicare Other | Source: Ambulatory Visit | Attending: Cardiology | Admitting: Cardiology

## 2018-06-27 ENCOUNTER — Ambulatory Visit (HOSPITAL_COMMUNITY): Payer: Medicare Other

## 2018-06-27 DIAGNOSIS — Z951 Presence of aortocoronary bypass graft: Secondary | ICD-10-CM | POA: Diagnosis not present

## 2018-06-28 ENCOUNTER — Other Ambulatory Visit: Payer: Self-pay | Admitting: Cardiology

## 2018-06-30 ENCOUNTER — Encounter (HOSPITAL_COMMUNITY)
Admission: RE | Admit: 2018-06-30 | Discharge: 2018-06-30 | Disposition: A | Discharge status: Home / Self Care | Payer: Medicare Other | Source: Ambulatory Visit | Attending: Cardiology | Admitting: Cardiology

## 2018-06-30 ENCOUNTER — Ambulatory Visit (HOSPITAL_COMMUNITY): Payer: Medicare Other

## 2018-06-30 DIAGNOSIS — Z951 Presence of aortocoronary bypass graft: Secondary | ICD-10-CM

## 2018-07-01 ENCOUNTER — Other Ambulatory Visit: Payer: Self-pay

## 2018-07-01 ENCOUNTER — Other Ambulatory Visit (HOSPITAL_COMMUNITY): Payer: Self-pay | Admitting: Cardiology

## 2018-07-01 ENCOUNTER — Telehealth: Payer: Self-pay

## 2018-07-01 DIAGNOSIS — I5032 Chronic diastolic (congestive) heart failure: Secondary | ICD-10-CM

## 2018-07-01 MED ORDER — SACUBITRIL-VALSARTAN 49-51 MG PO TABS: 1.0000 | ORAL_TABLET | Freq: Two times a day (BID) | ORAL | Status: DC

## 2018-07-01 MED ORDER — SACUBITRIL-VALSARTAN 49-51 MG PO TABS: 1.0000 | ORAL_TABLET | Freq: Two times a day (BID) | ORAL | 3 refills | Status: DC

## 2018-07-01 NOTE — Telephone Encounter (Signed)
Please send prescription for Entresto 49-51 mg bid 60 pills with 3 refills.  Thanks MJP

## 2018-07-01 NOTE — Telephone Encounter (Signed)
PT called and wanted to know if you were going to prescribe him some more Entresto?

## 2018-07-02 ENCOUNTER — Ambulatory Visit (HOSPITAL_COMMUNITY): Payer: Medicare Other

## 2018-07-02 ENCOUNTER — Encounter (HOSPITAL_COMMUNITY)
Admission: RE | Admit: 2018-07-02 | Discharge: 2018-07-02 | Disposition: A | Discharge status: Home / Self Care | Payer: Medicare Other | Source: Ambulatory Visit | Attending: Cardiology | Admitting: Cardiology

## 2018-07-02 DIAGNOSIS — Z951 Presence of aortocoronary bypass graft: Secondary | ICD-10-CM | POA: Diagnosis not present

## 2018-07-02 LAB — BASIC METABOLIC PANEL
BUN/Creatinine Ratio: 26 — ABNORMAL HIGH (ref 10–24)
BUN: 42 mg/dL — ABNORMAL HIGH (ref 8–27)
CO2: 23 mmol/L (ref 20–29)
Calcium: 10 mg/dL (ref 8.6–10.2)
Chloride: 102 mmol/L (ref 96–106)
Creatinine, Ser: 1.6 mg/dL — ABNORMAL HIGH (ref 0.76–1.27)
GFR calc Af Amer: 50 mL/min/{1.73_m2} — ABNORMAL LOW (ref >59)
GFR calc non Af Amer: 44 mL/min/{1.73_m2} — ABNORMAL LOW (ref >59)
Glucose: 116 mg/dL — ABNORMAL HIGH (ref 65–99)
Potassium: 4.4 mmol/L (ref 3.5–5.2)
Sodium: 140 mmol/L (ref 134–144)

## 2018-07-02 LAB — LIPID PANEL W/O CHOL/HDL RATIO
Cholesterol, Total: 112 mg/dL (ref 100–199)
HDL: 32 mg/dL — ABNORMAL LOW (ref >39)
LDL Calculated: 53 mg/dL (ref 0–99)
Triglycerides: 134 mg/dL (ref 0–149)
VLDL Cholesterol Cal: 27 mg/dL (ref 5–40)

## 2018-07-03 ENCOUNTER — Ambulatory Visit: Payer: Medicare Other | Admitting: Cardiology

## 2018-07-03 VITALS — BP 99/64 | HR 64 | Ht 67.0 in | Wt 195.0 lb

## 2018-07-03 DIAGNOSIS — I10 Essential (primary) hypertension: Secondary | ICD-10-CM

## 2018-07-04 ENCOUNTER — Encounter (HOSPITAL_COMMUNITY)
Admission: RE | Admit: 2018-07-04 | Discharge: 2018-07-04 | Disposition: A | Discharge status: Home / Self Care | Payer: Medicare Other | Source: Ambulatory Visit | Attending: Cardiology | Admitting: Cardiology

## 2018-07-04 ENCOUNTER — Ambulatory Visit (HOSPITAL_COMMUNITY): Payer: Medicare Other

## 2018-07-04 DIAGNOSIS — Z951 Presence of aortocoronary bypass graft: Secondary | ICD-10-CM

## 2018-07-07 ENCOUNTER — Encounter (HOSPITAL_COMMUNITY)
Admission: RE | Admit: 2018-07-07 | Discharge: 2018-07-07 | Disposition: A | Discharge status: Home / Self Care | Payer: Medicare Other | Source: Ambulatory Visit | Attending: Cardiology | Admitting: Cardiology

## 2018-07-07 ENCOUNTER — Ambulatory Visit (HOSPITAL_COMMUNITY): Payer: Medicare Other

## 2018-07-07 DIAGNOSIS — Z951 Presence of aortocoronary bypass graft: Secondary | ICD-10-CM | POA: Diagnosis not present

## 2018-07-07 NOTE — Progress Notes (Signed)
I have reviewed a Home Exercise Prescription with [facility] [facility] . Nathan Medina is not currently exercising at home.  The patient was advised to walk 4 days a week for 30-45 minutes.  Shann and I discussed how to progress their exercise prescription.  The patient stated that their goals were to increase walking pace.  The patient stated that they understand the exercise prescription.  We reviewed exercise guidelines, target heart rate during exercise, RPE Scale, weather conditions, NTG use, endpoints for exercise, warmup and cool down.  Patient is encouraged to come to me with any questions. I will continue to follow up with the patient to assist them with progression and safety.

## 2018-07-09 ENCOUNTER — Ambulatory Visit (HOSPITAL_COMMUNITY): Payer: Medicare Other

## 2018-07-09 ENCOUNTER — Encounter (HOSPITAL_COMMUNITY)
Admission: RE | Admit: 2018-07-09 | Discharge: 2018-07-09 | Disposition: A | Discharge status: Home / Self Care | Payer: Medicare Other | Source: Ambulatory Visit | Attending: Cardiology | Admitting: Cardiology

## 2018-07-09 DIAGNOSIS — Z951 Presence of aortocoronary bypass graft: Secondary | ICD-10-CM

## 2018-07-09 NOTE — Progress Notes (Signed)
Nathan Medina 68 y.o. male Nutrition Note Spoke with pt. Nutrition plan and goals reviewed with pt. Pt is following heart healthy diet. Pt wants to lose wt, has been watching portion size and increasing veggies since orientation. Additional heart healthy weight loss tips reviewed with patient today (label reading, how to build a healthy plate, healthy food choices, portion sizes, weighing and measuring portions for accuracy, eating frequently across the day, planning ahead, goal setting). Pt has Type 2 Diabetes, his last A1c indicates blood glucose well-controlled. Pt checks CBG's 1 times a day. Fasting CBG's reportedly 110's-160's mg/dL. Per discussion, pt does not use canned/convenience foods often. Pt does not add salt to food. Pt does not eat out frequently. Pt expressed understanding of the information reviewed. Pt aware of nutrition education classes offered and would like to attend nutrition classes.  Lab Results  Component Value Date   HGBA1C 6.2 (H) 02/14/2018    Wt Readings from Last 3 Encounters:  07/03/18 195 lb (88.5 kg)  06/10/18 197 lb 12 oz (89.7 kg)  06/05/18 197 lb (89.4 kg)    Nutrition Diagnosis ? Food-and nutrition-related knowledge deficit related to lack of exposure to information as related to diagnosis of: ? CVD ? Type 2 Diabetes ? Obese  I = 30-34.9 related to excessive energy intake as evidenced by a Body mass index is 30.97 kg/m.  Nutrition Intervention ? Pt's individual nutrition plan and goals reviewed with pt.  Nutrition Goal(s):  ? Pt to identify and limit food sources of saturated fat, trans fat, refined carbohydrates and sodium ? Pt to identify food quantities necessary to achieve weight loss of 6-24 lbs. at graduation from cardiac rehab ? Pt to eat a variety of non-starchy vegetables  Plan:  ? Pt to attend nutrition classes ? Nutrition I ? Nutrition II ? Portion Distortion  ? Diabetes Blitz ? Diabetes Q & Ae determined ? Will provide client-centered  nutrition education as part of interdisciplinary care ? Monitor and evaluate progress toward nutrition goal with team.    Laurina Bustle, MS, RD, LDN 07/09/2018 12:08 PM

## 2018-07-11 ENCOUNTER — Ambulatory Visit (HOSPITAL_COMMUNITY): Payer: Medicare Other

## 2018-07-11 ENCOUNTER — Encounter (HOSPITAL_COMMUNITY)
Admission: RE | Admit: 2018-07-11 | Discharge: 2018-07-11 | Disposition: A | Discharge status: Home / Self Care | Payer: Medicare Other | Source: Ambulatory Visit | Attending: Cardiology | Admitting: Cardiology

## 2018-07-11 DIAGNOSIS — Z951 Presence of aortocoronary bypass graft: Secondary | ICD-10-CM | POA: Diagnosis not present

## 2018-07-13 ENCOUNTER — Other Ambulatory Visit: Payer: Self-pay | Admitting: Cardiology

## 2018-07-14 ENCOUNTER — Ambulatory Visit (HOSPITAL_COMMUNITY): Payer: Medicare Other

## 2018-07-14 ENCOUNTER — Encounter (HOSPITAL_COMMUNITY)
Admission: RE | Admit: 2018-07-14 | Discharge: 2018-07-14 | Disposition: A | Discharge status: Home / Self Care | Payer: Medicare Other | Source: Ambulatory Visit | Attending: Cardiology | Admitting: Cardiology

## 2018-07-14 DIAGNOSIS — Z951 Presence of aortocoronary bypass graft: Secondary | ICD-10-CM | POA: Diagnosis not present

## 2018-07-16 ENCOUNTER — Other Ambulatory Visit: Payer: Self-pay | Admitting: Cardiology

## 2018-07-16 ENCOUNTER — Ambulatory Visit (HOSPITAL_COMMUNITY): Payer: Medicare Other

## 2018-07-16 ENCOUNTER — Encounter (HOSPITAL_COMMUNITY)
Admission: RE | Admit: 2018-07-16 | Discharge: 2018-07-16 | Disposition: A | Discharge status: Home / Self Care | Payer: Medicare Other | Source: Ambulatory Visit | Attending: Cardiology | Admitting: Cardiology

## 2018-07-16 DIAGNOSIS — Z951 Presence of aortocoronary bypass graft: Secondary | ICD-10-CM

## 2018-07-16 DIAGNOSIS — I255 Ischemic cardiomyopathy: Secondary | ICD-10-CM

## 2018-07-17 NOTE — Progress Notes (Signed)
BP is borderline low but asymptomatic. Will continue to monitor. Encouraged to contact for any dizziness or lightheadedness.

## 2018-07-18 ENCOUNTER — Encounter (HOSPITAL_COMMUNITY)
Admission: RE | Admit: 2018-07-18 | Discharge: 2018-07-18 | Disposition: A | Discharge status: Home / Self Care | Payer: Medicare Other | Source: Ambulatory Visit | Attending: Cardiology | Admitting: Cardiology

## 2018-07-18 ENCOUNTER — Ambulatory Visit (HOSPITAL_COMMUNITY): Payer: Medicare Other

## 2018-07-18 DIAGNOSIS — Z951 Presence of aortocoronary bypass graft: Secondary | ICD-10-CM

## 2018-07-21 ENCOUNTER — Encounter (HOSPITAL_COMMUNITY)
Admission: RE | Admit: 2018-07-21 | Discharge: 2018-07-21 | Disposition: A | Discharge status: Home / Self Care | Payer: Medicare Other | Source: Ambulatory Visit | Attending: Cardiology | Admitting: Cardiology

## 2018-07-21 ENCOUNTER — Ambulatory Visit (HOSPITAL_COMMUNITY): Payer: Medicare Other

## 2018-07-21 DIAGNOSIS — Z951 Presence of aortocoronary bypass graft: Secondary | ICD-10-CM | POA: Diagnosis present

## 2018-07-23 ENCOUNTER — Encounter (HOSPITAL_COMMUNITY)
Admission: RE | Admit: 2018-07-23 | Discharge: 2018-07-23 | Disposition: A | Discharge status: Home / Self Care | Payer: Medicare Other | Source: Ambulatory Visit | Attending: Cardiology | Admitting: Cardiology

## 2018-07-23 ENCOUNTER — Ambulatory Visit (HOSPITAL_COMMUNITY): Payer: Medicare Other

## 2018-07-23 DIAGNOSIS — Z951 Presence of aortocoronary bypass graft: Secondary | ICD-10-CM | POA: Diagnosis not present

## 2018-07-23 NOTE — Progress Notes (Signed)
Patient is here for follow up visit.  Subjective:   @Patient  ID: Nathan Medina, male    DOB: [birth year], 68 y.o.   MRN: [id number]  Chief Complaint  Patient presents with  . Cardiomyopathy  . Dizziness  . Leg Pain    HPI   68 year old Caucasian male with hypertension, CKD stage III, and 2 diabetes mellitus, ischemic cardiomyopathy EF 35-40%, coronary artery disease s/p CABGX3 (LIMA-LAD, SVG-dRCA, SVG-ramus) by Dr. Servando Snare on 04/01/2018, stable renal and lung nodules followed by specialists, s/p thyroidectomy.  At last visit, I increased his Entresto to 49-51 mg bid, continued coreg 3.125 mg bid, spironolactone to 25 mg daily, lasix 40/80 mg alternate day. Repeat echocardiogram is ordered for 08/2018.  He is here for follow up for 3 months.  He has been working diligently with cardiac rehab.  He complains of lightheadedness, correlating with systolic blood pressures of less than 120 mmHg.  He has been drinking about 60 ounces of water every day.  He also complains of burning sensation in his legs when he sleeps at night, that gets better with walking.  Overall, he complains that he feels no different with his overall energy levels and fatigue before and after the bypass surgery.  Of note, he recently saw his endocrinologist Dr. Buddy Duty who increase his levothyroxine dose.   Past Medical History:  Diagnosis Date  . Abnormal liver function test 05/23/2011  . ALLERGIC RHINITIS   . ANXIETY   . Aortic atherosclerosis ([place])   . Arthritis   . Bilateral renal cysts   . Cervical spondylolysis    Moderate  . Chronic kidney disease    CKD stage 3 per office visit note 08/08/17 on chart   . COLONIC POLYPS, HX OF   . Coronary artery disease   . DEPRESSION   . DIABETES MELLITUS, TYPE II   . Diverticulitis   . Epidermal cyst   . Fatty liver   . GERD   . GOUT   . History of inguinal hernia   . HOH (hard of hearing)    elft ear  . HYPERLIPIDEMIA   . HYPERTENSION   . IBS   .  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 Left renal mass 05/23/2011  . Lumbar radicular pain 06/03/2011  . Macular degeneration    right eye  . Multinodular goiter   . Pancreatic pseudocyst    Stable  . Peritonsillar abscess   . Pulmonary nodule    Right upper lobe  . Radial neck fracture   . Shortness of breath    occasional shortness of breath  with exertion  . Thyroid disease   . Thyroid nodule    Bilateral  . Vertigo     Past Surgical History:  Procedure Laterality Date  . APPENDECTOMY  02/12/11  . COLON SURGERY  08/11/10   sigmoid colectomy  . COLON SURGERY  02/12/11   colostomy takedown  . COLONOSCOPY    . CORONARY ARTERY BYPASS GRAFT N/A 04/01/2018   Procedure: CORONARY ARTERY BYPASS GRAFTING (CABG) x Three, using left internal mammary artery and right leg greater saphenous vein harvested endoscopically;  Surgeon: Grace Isaac, MD;  Location: [facility];  Service: Open Heart Surgery;  Laterality: N/A;  . CYST REMOVAL TRUNK Right 02/20/2018   Procedure: epidermal cyst excision right lower back;  Surgeon: [name], MD;  Location: [facility];  Service: General;  Laterality: Right;  . HERNIA  REPAIR    . INSERTION OF MESH N/A 08/20/2012   Procedure: INSERTION OF MESH;  Surgeon: [name], MD;  Location: [facility];  Service: General;  Laterality: N/A;  . LEFT HEART CATH AND CORONARY ANGIOGRAPHY N/A 01/14/2018   Procedure: LEFT HEART CATH AND CORONARY ANGIOGRAPHY;  Surgeon: [name], MD;  Location: [facility];  Service: Cardiovascular;  Laterality: N/A;  . LYSIS OF ADHESION  08/20/2012   Procedure: LYSIS OF ADHESION;  Surgeon: [name], MD;  Location: [facility];  Service: General;;  . SEPTOPLASTY  age 4  . TEE WITHOUT CARDIOVERSION N/A 04/01/2018   Procedure: TRANSESOPHAGEAL ECHOCARDIOGRAM (TEE);  Surgeon: Grace Isaac, MD;  Location: [facility];  Service: Open Heart  Surgery;  Laterality: N/A;  . THYROIDECTOMY N/A 10/17/2017   Procedure: TOTAL THYROIDECTOMY;  Surgeon: [name], MD;  Location: [facility];  Service: General;  Laterality: N/A;  . TONSILLECTOMY Right 08/01/2016   Procedure: INCISION AND DRAINAGE RIGHT PERI-TONSILLAR ABSCESS;  Surgeon: [name], MD;  Location: [facility];  Service: ENT;  Laterality: Right;  . ULTRASOUND GUIDANCE FOR VASCULAR ACCESS  01/14/2018   Procedure: Ultrasound Guidance For Vascular Access;  Surgeon: [name], MD;  Location: [facility];  Service: Cardiovascular;;  . VENTRAL HERNIA REPAIR  08/20/2012   Procedure: HERNIA REPAIR VENTRAL ADULT;  Surgeon: [name], MD;  Location: [facility];  Service: General;;    Social History   Socioeconomic History  . Marital status: Single    Spouse name: Not on file  . Number of children: Not on file  . Years of education: Not on file  . Highest education level: Not on file  Occupational History  . Not on file  Social Needs  . Financial resource strain: Not on file  . Food insecurity:    Worry: Not on file    Inability: Not on file  . Transportation needs:    Medical: Not on file    Non-medical: Not on file  Tobacco Use  . Smoking status: Never Smoker  . Smokeless tobacco: Never Used  Substance and Sexual Activity  . Alcohol use: Yes    Comment: occ  . Drug use: No  . Sexual activity: Not on file  Lifestyle  . Physical activity:    Days per week: Not on file    Minutes per session: Not on file  . Stress: Not on file  Relationships  . Social connections:    Talks on phone: Not on file    Gets together: Not on file    Attends religious service: Not on file    Active member of club or organization: Not on file    Attends meetings of clubs or organizations: Not on file    Relationship status: Not on file  . Intimate partner violence:    Fear of current or ex partner: Not on file    Emotionally abused: Not on file    Physically abused: Not on  file    Forced sexual activity: Not on file  Other Topics Concern  . Not on file  Social History Narrative  . Not on file    Current Outpatient Medications on File Prior to Visit  Medication Sig Dispense Refill  . acetaminophen (TYLENOL) 325 MG tablet Take 2 tablets (650 mg total) by mouth every 6 (six) hours as needed for mild pain (or Fever >/= 101). 20 tablet 0  . allopurinol (ZYLOPRIM) 300 MG  tablet TAKE 1 TABLET BY MOUTH EVERY DAY (Patient taking differently: Take 300 mg by mouth daily. ) 90 tablet 0  . ALPRAZolam (XANAX) 0.5 MG tablet Take 1 tablet (0.5 mg total) by mouth daily as needed for sleep. 1/2 - 1 by mouth once daily as needed (Patient taking differently: Take 0.25-0.5 mg by mouth at bedtime as needed for sleep. ) 90 tablet 2  . aspirin EC 81 MG tablet Take 81 mg by mouth daily.    [name] aspirin-acetaminophen-caffeine (EXCEDRIN EXTRA STRENGTH) 250-250-65 MG tablet Take 1-2 tablets by mouth every 6 (six) hours as needed for headache.    [name] atorvastatin (LIPITOR) 10 MG tablet Take 10 mg by mouth at bedtime.    . calcium carbonate (TUMS) 500 MG chewable tablet Chew 2 tablets (400 mg of elemental calcium total) by mouth 2 (two) times daily. (Patient taking differently: Chew 2 tablets by mouth 2 (two) times daily as needed for indigestion. ) 90 tablet 1  . carvedilol (COREG) 3.125 MG tablet TAKE 1 TABLET BY MOUTH TWICE DAILY 60 tablet 0  . Cholecalciferol (VITAMIN D-3) 5000 units TABS Take 5,000 Units by mouth at bedtime.    . cyclobenzaprine (FLEXERIL) 5 MG tablet Take 5 mg by mouth 3 (three) times daily as needed for muscle spasms.    . diphenhydrAMINE (BENADRYL) 25 MG tablet Take 25 mg by mouth at bedtime as needed for sleep.    . furosemide (LASIX) 40 MG tablet Take 120 mg by mouth daily.     . Guaifenesin 1200 MG TB12 Take 1,200 mg by mouth 2 (two) times daily as needed (cough).    . insulin glargine (LANTUS) 100 unit/mL SOPN Inject 40 Units into the skin at bedtime.     [name]  levothyroxine (SYNTHROID, LEVOTHROID) 175 MCG tablet Take 175 mcg by mouth daily before breakfast.    . Multiple Vitamins-Minerals (PRESERVISION AREDS 2 PO) Take 1 tablet by mouth 2 (two) times daily.    [name] omega-3 acid ethyl esters (LOVAZA) 1 g capsule Take 2 g by mouth 2 (two) times daily.     [name] omeprazole (PRILOSEC) 20 MG capsule Take 20 mg by mouth daily before breakfast.     . sacubitril-valsartan (ENTRESTO) 49-51 MG Take 1 tablet by mouth 2 (two) times daily. 60 tablet 3  . sildenafil (REVATIO) 20 MG tablet Take 100 mg by mouth daily as needed.     [name] spironolactone (ALDACTONE) 25 MG tablet Take 25 mg by mouth daily.    . traMADol (ULTRAM) 50 MG tablet Take 1-2 tablets (50-100 mg total) by mouth every 6 (six) hours as needed for moderate pain. 24 tablet 0  . triamcinolone cream (KENALOG) 0.1 % Apply 1 application topically 2 (two) times daily as needed (skin irritation).    . vitamin E (VITAMIN E) 1000 UNIT capsule Take 1,000 Units by mouth at bedtime.    Alveda Reasons 2.5 MG TABS tablet TAKE 1 TABLET BY MOUTH TWICE DAILY 60 tablet 0   No current facility-administered medications on file prior to visit.     Cardiovascular studies:  Echocardiogram 04/24/2018: 1. Technically limited study, only apical views could be obtained. 2. Left ventricle cavity is normal in size. Apical dyskinesia. Visual EF is 35-40%. Doppler evidence of grade I (impaired) diastolic dysfunction. 3. Left atrial cavity is mildly dilated. 4. Mild (Grade I) mitral regurgitation. 5. Mild tricuspid regurgitation. No evidence of pulmonary hypertension. 6. No diagnostic change c.f. echo. of 12/26/2017.  EKG 04/14/2018: Sinus rhythm 72 bpm.  Normal axis. Normal conduction. 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 Low-voltage.  Post Bypass TEE 04/01/2018: Tricuspid, Pulmonic, Mitral and Aortic valve unchanged. Anterior and anteroseptal wall motion improved, LVEF > 55% (CO > 6) with vasopressor support. No dissection noted after cannula  removed.    Cath 01/14/2018: LM: Normal LAD: Ostial 100% occluded. 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 RPDA distally occluded. Good surgical revascularization targets seen. LVEDP: Normal Recommendation: Diabetic patient with reduced LVEF and high Syntax score and moderate LAD territory ischemia superimposed on infarct. Recommend CVTS evaluation for CABG  Arterial duplex 11/19/2017: Normal examination. No evidence of hemodynamically significant peripheral arterial disease.  CT chest 08/06/2017: - persistent right upper lobe nodule with increase in size compared to 2018 PET scan 08/26/2017: - very low metabolic activity in the right upper lobe nodule. SUV 1.3. No other significant uptake  Labs: Labs 06/13/2018: Glucose 248. BUN/Cr 32/1.32. eGFR 55. Na/K 141/4.6  Labs 05/12/2018: Glucose 202. BUN/Cr 43/1.65. eGFR 42. Na/K 142/4.7  Labs 04/22/2018: Glucose 162. BUN/Cr 24/1.43. eGFR 50. Na/K 140/4.3  Labs 01/08/2018: Glucose 138. BUN/Cr 30/1.55. EGFR 53. Na/K 143/3.9 H/H 15/46. MCV 94. Platelets 144.  Labs 11/26/2017: RBC 5.43, Hct 49.3, Plt 121, CBC otherwise normal.  Labs 10/18/2017: Potassium 4.4, creatinine 1.79, EGFR 38, BMP otherwise normal.  Review of Systems  Constitution: Positive for malaise/fatigue. Negative for decreased appetite, weight gain and weight loss.  HENT: Negative for congestion.   Eyes: Negative for visual disturbance.  Cardiovascular: Negative for chest pain, claudication, dyspnea on exertion, leg swelling, palpitations and syncope.  Respiratory: Negative for shortness of breath.   Endocrine: Negative for cold intolerance.  Hematologic/Lymphatic: Does not bruise/bleed easily.  Skin: Negative for itching and rash.  Musculoskeletal: Negative for myalgias.  Gastrointestinal:  Negative for abdominal pain, nausea and vomiting.  Genitourinary: Negative for dysuria.  Neurological: Positive for paresthesias (Bilateral legs). Negative for dizziness and weakness.  Psychiatric/Behavioral: The patient is not nervous/anxious.   All other systems reviewed and are negative.      Objective:   Vitals:   07/24/18 1008  BP: 110/66  Pulse: (!) 50  SpO2: 98%     Physical Exam  Constitutional: He is oriented to person, place, and time. He appears well-developed and well-nourished. No distress.  HENT:  Head: Normocephalic and atraumatic.  Eyes: Pupils are equal, round, and reactive to light. Conjunctivae are normal.  Neck: No JVD present.  Cardiovascular: Regular rhythm and intact distal pulses. Bradycardia present.  Pulmonary/Chest: Effort normal and breath sounds normal. He has no wheezes. He has no rales.  Sternotomy scar   Abdominal: Soft. Bowel sounds are normal. There is no rebound.  Musculoskeletal:        General: Edema (Trace) present.  Lymphadenopathy:    He has no cervical adenopathy.  Neurological: He is alert and oriented to person, place, and time. No cranial nerve deficit.  Skin: Skin is warm and dry.  Psychiatric: He has a normal mood and affect.  Nursing note and vitals reviewed.       Assessment & Recommendations:    68 year old Caucasian male with hypertension, CKD stage III, and 2 diabetes mellitus, ischemic cardiomyopathy EF 35-40%, coronary artery disease s/p CABGX3 (LIMA-LAD, SVG-dRCA, SVG-ramus) by Dr. Servando Snare on 04/01/2018, stable renal and lung nodules followed by specialists, s/p thyroidectomy.  Ischemic cardiomyopathy: NYHA class II symptoms. Continue Entresto to 49-51 mg bid.  His lightheadedness symptoms are likely due to inadequate hydration while on Entresto.  I have encouraged him to increase his fluid intake.  If symptoms still do not improve, I will then consider reducing Entresto dose.  Continue Coreg 3.125 mg bid for  now. Continue spironolactone 25 mg daily. Continue lasix 40 mg daily. Repeat echocardiogram in 08/2018.  Fatigue: While his fatigue symptoms have persisted post CABG, I suspect this may be related to hypothyroidism. Will see if symptoms improve with increase in levothyroxine dose.   CAD: S/p CABG. No angina symptoms. Continue aspirin to 81 mg daily and add Xarelto 2.5 mg bid (COMPASS trial).  Dilated aortic root: 4.1 cm on CT without contrast in 07/2017. It does not match with aortic root diameter on echocardiogram-likely underestimated. He has an upcoming CT chest on 03/20 to monitor his lung nodules. Will discuss with Dr. Servando Snare if we should switch it to CTA. If so, he should hold ENtresto the day before and the day of CTA, and hydrate himself liberally, to avoid CIN.   Postop Afib: No recurrence. Will monitor.  Hyperlipidemia: Well controlled. TG have also improved. Continue lipitor 10 mg daily.    Management of DM, CKD III, renal and lung nodules as per PCP and other specialists.  I will see him back in 3 months with repeat BMP.   [name], MD [facility] Pager: [phone] Office: [phone] If no answer Cell [phone]

## 2018-07-24 ENCOUNTER — Ambulatory Visit: Payer: Medicare Other | Admitting: Cardiology

## 2018-07-24 ENCOUNTER — Encounter: Payer: Self-pay | Admitting: Cardiology

## 2018-07-24 DIAGNOSIS — I77810 Thoracic aortic ectasia: Secondary | ICD-10-CM

## 2018-07-24 DIAGNOSIS — I5022 Chronic systolic (congestive) heart failure: Secondary | ICD-10-CM

## 2018-07-24 DIAGNOSIS — R911 Solitary pulmonary nodule: Secondary | ICD-10-CM

## 2018-07-24 DIAGNOSIS — I255 Ischemic cardiomyopathy: Secondary | ICD-10-CM | POA: Diagnosis not present

## 2018-07-24 DIAGNOSIS — I509 Heart failure, unspecified: Secondary | ICD-10-CM

## 2018-07-24 HISTORY — DX: Solitary pulmonary nodule: R91.1

## 2018-07-24 HISTORY — DX: Thoracic aortic ectasia: I77.810

## 2018-07-24 HISTORY — DX: Ischemic cardiomyopathy: I25.5

## 2018-07-24 HISTORY — DX: Heart failure, unspecified: I50.9

## 2018-07-24 MED ORDER — SPIRONOLACTONE 25 MG PO TABS: 25.0000 mg | ORAL_TABLET | Freq: Every day | ORAL | 3 refills | Status: DC

## 2018-07-24 NOTE — Progress Notes (Signed)
Cardiac Individual Treatment Plan  Patient Details  Name: Nathan Medina MRN: [id number] Date of Birth: 28-Mar-1951 Referring Provider:     [facility] from 06/10/2018 in [place]  Referring Provider  Dr. 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  acetaminophen (TYLENOL) 325 MG tablet, Take 2 tablets (650 mg total) by mouth every 6 (six) hours as needed for mild pain (or Fever >/= 101)., Disp: 20 tablet, Rfl: 0 .  allopurinol (ZYLOPRIM) 300 MG tablet, TAKE 1 TABLET BY MOUTH EVERY DAY (Patient taking differently: Take 300 mg by mouth daily. ), Disp: 90 tablet, Rfl: 0 .  ALPRAZolam (XANAX) 0.5 MG tablet, Take 1 tablet (0.5 mg total) by mouth daily as needed for sleep. 1/2 - 1 by mouth once daily as needed (Patient taking differently: Take 0.25-0.5 mg by mouth at bedtime as needed for sleep. ), Disp: 90 tablet, Rfl: 2 .  aspirin EC 81 MG tablet, Take 81 mg by mouth daily., Disp: , Rfl:  .  aspirin-acetaminophen-caffeine (EXCEDRIN EXTRA STRENGTH) 250-250-65 MG tablet, Take 1-2 tablets by mouth every 6 (six) hours as needed for headache., Disp: , Rfl:  .  atorvastatin (LIPITOR) 10 MG tablet, Take 10 mg by mouth at bedtime., Disp: , Rfl:  .  calcium carbonate (TUMS) 500 MG chewable tablet, Chew 2 tablets (400 mg of elemental calcium total) by mouth 2 (two) times daily. (Patient taking differently: Chew 2 tablets by mouth 2 (two) times daily as needed for indigestion. ), Disp: 90 tablet, Rfl: 1 .  carvedilol (COREG) 3.125 MG tablet, TAKE 1 TABLET BY MOUTH TWICE DAILY, Disp: 60 tablet, Rfl: 0 .  Cholecalciferol (VITAMIN D-3) 5000 units TABS, Take 5,000 Units by mouth at bedtime., Disp: , Rfl:  .  cyclobenzaprine  (FLEXERIL) 5 MG tablet, Take 5 mg by mouth 3 (three) times daily as needed for muscle spasms., Disp: , Rfl:  .  diphenhydrAMINE (BENADRYL) 25 MG tablet, Take 25 mg by mouth at bedtime as needed for sleep., Disp: , Rfl:  .  furosemide (LASIX) 40 MG tablet, Take 120 mg by mouth daily. , Disp: , Rfl:  .  Guaifenesin 1200 MG TB12, Take 1,200 mg by mouth 2 (two) times daily as needed (cough)., Disp: , Rfl:  .  insulin glargine (LANTUS) 100 unit/mL SOPN, Inject 40 Units into the skin at bedtime. , Disp: , Rfl:  .  levothyroxine (SYNTHROID, LEVOTHROID) 175 MCG tablet, Take 200 mcg by mouth daily before breakfast. , Disp: , Rfl:  .  Multiple Vitamins-Minerals (PRESERVISION AREDS 2 PO), Take 1 tablet by mouth 2 (two) times daily., Disp: , Rfl:  .  omega-3 acid ethyl esters (LOVAZA) 1 g capsule, Take 2 g by mouth 2 (two) times daily. , Disp: , Rfl:  .  omeprazole (PRILOSEC) 20 MG capsule, Take 20 mg by mouth daily before breakfast. , Disp: , Rfl:  .  sacubitril-valsartan (ENTRESTO) 49-51 MG, Take 1 tablet by mouth 2 (two) times daily., Disp: 60 tablet, Rfl: 3 .  sildenafil (REVATIO) 20 MG tablet, Take 100 mg by mouth daily as needed. , Disp: , Rfl:  .  spironolactone (ALDACTONE) 25 MG tablet, Take 1 tablet (25 mg total) by mouth  daily., Disp: 90 tablet, Rfl: 3 .  traMADol (ULTRAM) 50 MG tablet, Take 1-2 tablets (50-100 mg total) by mouth every 6 (six) hours as needed for moderate pain., Disp: 24 tablet, Rfl: 0 .  triamcinolone cream (KENALOG) 0.1 %, Apply 1 application topically 2 (two) times daily as needed (skin irritation)., Disp: , Rfl:  .  vitamin E (VITAMIN E) 1000 UNIT capsule, Take 1,000 Units by mouth at bedtime., Disp: , Rfl:  .  XARELTO 2.5 MG TABS tablet, TAKE 1 TABLET BY MOUTH TWICE DAILY, Disp: 60 tablet, Rfl: 0  Past Medical History: Past Medical History:  Diagnosis Date  . Abnormal liver function test 05/23/2011  . ALLERGIC RHINITIS   . ANXIETY   . Aortic atherosclerosis ([place])   .  Arthritis   . Ascending aorta dilation ([facility]) 07/24/2018  . Bilateral renal cysts   . Cervical spondylolysis    Moderate  . CHF (congestive heart failure) ([place]) 07/24/2018  . Chronic kidney disease    CKD stage 3 per office visit note 08/08/17 on chart   . COLONIC POLYPS, HX OF   . Coronary artery disease   . DEPRESSION   . DIABETES MELLITUS, TYPE II   . Diverticulitis   . Epidermal cyst   . Fatty liver   . GERD   . GOUT   . History of inguinal hernia   . HOH (hard of hearing)    elft ear  . HYPERLIPIDEMIA   . HYPERTENSION   . IBS   . 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 Ischemic cardiomyopathy 07/24/2018  . Left renal mass 05/23/2011  . Lumbar radicular pain 06/03/2011  . Macular degeneration    right eye  . Multinodular goiter   . Pancreatic pseudocyst    Stable  . Peritonsillar abscess   . Pulmonary nodule    Right upper lobe  . Radial neck fracture   . Right upper lobe pulmonary nodule 07/24/2018  . Shortness of breath    occasional shortness of breath  with exertion  . Thyroid disease   . Thyroid nodule    Bilateral  . Vertigo     Tobacco Use: Social History   Tobacco Use  Smoking Status Never Smoker  Smokeless Tobacco Never Used    Labs: Recent Review Flowsheet Data    Labs for ITP Cardiac and Pulmonary Rehab Latest Ref Rng & Units 04/01/2018 04/01/2018 04/02/2018 04/04/2018 07/01/2018   Cholestrol 100 - 199 mg/dL - - - - 112   LDLCALC 0 - 99 mg/dL - - - - 53   LDLDIRECT mg/dL - - - - -   HDL >39 mg/dL - - - - 32(L)   Trlycerides 0 - 149 mg/dL - - - - 134   Hemoglobin A1c 4.8 - 5.6 % - - - - -   PHART 7.350 - 7.450 - 7.372 - - -   PCO2ART 32.0 - 48.0 mmHg - 40.2 - - -   HCO3 20.0 - 28.0 mmol/L - 23.2 - - -   TCO2 22 - 32 mmol/L 24 24 26  - -   ACIDBASEDEF 0.0 - 2.0 mmol/L - 2.0 - - -   O2SAT % - 89.0 - 69.6 -      Capillary Blood Glucose: Lab Results  Component  Value Date   GLUCAP 159 (H) 06/20/2018   GLUCAP 139 (H) 06/18/2018   GLUCAP 135 (H) 06/18/2018   GLUCAP 164 (H) 04/09/2018  GLUCAP 126 (H) 04/09/2018     Exercise Target Goals: Exercise Program Goal: Individual exercise prescription set using results from initial 6 min walk test and THRR while considering  patient's activity barriers and safety.   Exercise Prescription Goal: Initial exercise prescription builds to 30-45 minutes a day of aerobic activity, 2-3 days per week.  Home exercise guidelines will be given to patient during program as part of exercise prescription that the participant will acknowledge.  Activity Barriers & Risk Stratification: Activity Barriers & Cardiac Risk Stratification - 06/10/18 1038      Activity Barriers & Cardiac Risk Stratification   Activity Barriers  Arthritis;Joint Problems    Cardiac Risk Stratification  High       6 Minute Walk: 6 Minute Walk    Row Name 06/10/18 1037         6 Minute Walk   Phase  Initial     Distance  1157 feet     Walk Time  6 minutes     # of Rest Breaks  0     MPH  2.19     METS  2.07     RPE  12     VO2 Peak  7.25     Symptoms  No     Resting HR  72 bpm     Resting BP  108/58     Resting Oxygen Saturation   96 %     Exercise Oxygen Saturation  during 6 min walk  97 %     Max Ex. HR  64 bpm     Max Ex. BP  102/54     2 Minute Post BP  102/78        Oxygen Initial Assessment:   Oxygen Re-Evaluation:   Oxygen Discharge (Final Oxygen Re-Evaluation):   Initial Exercise Prescription: Initial Exercise Prescription - 06/10/18 1100      Date of Initial Exercise RX and Referring Provider   Date  06/10/18    Referring Provider  Dr. Virgina Jock    Expected Discharge Date  09/15/18      NuStep   Level  2    SPM  75    Minutes  10    METs  2      Arm Ergometer   Level  1    Watts  30    Minutes  10    METs  2.75      Track   Laps  7    Minutes  10    METs  2.23      Prescription Details    Frequency (times per week)  3    Duration  Progress to 30 minutes of continuous aerobic without signs/symptoms of physical distress      Intensity   THRR 40-80% of Max Heartrate  61-122    Ratings of Perceived Exertion  11-13      Progression   Progression  Continue to progress workloads to maintain intensity without signs/symptoms of physical distress.      Resistance Training   Training Prescription  Yes    Weight  2 lbs.     Reps  10-15       Perform Capillary Blood Glucose checks as needed.  Exercise Prescription Changes: Exercise Prescription Changes    Row Name 06/18/18 1300 06/27/18 1416 07/07/18 1200 07/11/18 1417       Response to Exercise   Blood Pressure (Admit)  102/64  112/62  -  118/60  Blood Pressure (Exercise)  122/68  120/70  -  90/62    Blood Pressure (Exit)  104/64  98/70  -  102/60    Heart Rate (Admit)  72 bpm  77 bpm  -  77 bpm    Heart Rate (Exercise)  87 bpm  86 bpm  -  91 bpm    Heart Rate (Exit)  72 bpm  77 bpm  -  77 bpm    Rating of Perceived Exertion (Exercise)  12  13  -  13    Perceived Dyspnea (Exercise)  0  0  -  0    Symptoms  None  None  -  None    Comments  Pt oriented to exercise equipment  None  -  None    Duration  Progress to 30 minutes of  aerobic without signs/symptoms of physical distress  Progress to 30 minutes of  aerobic without signs/symptoms of physical distress  -  Progress to 30 minutes of  aerobic without signs/symptoms of physical distress    Intensity  THRR unchanged  THRR unchanged  -  THRR unchanged      Progression   Progression  Continue to progress workloads to maintain intensity without signs/symptoms of physical distress.  Continue to progress workloads to maintain intensity without signs/symptoms of physical distress.  -  Continue to progress workloads to maintain intensity without signs/symptoms of physical distress.    Average METs  2.39  2.53  -  2.7      Resistance Training   Training Prescription  No   Yes  -  Yes    Weight  -  2lbs  -  4lbs    Reps  -  10-15  -  10-15    Time  -  10 Minutes  -  10 Minutes      Interval Training   Interval Training  No  No  -  No      NuStep   Level  2  2  -  2    SPM  70  70  -  70    Minutes  10  10  -  10    METs  1.5  1.9  -  3.2      Arm Ergometer   Level  1  1  -  1    Watts  30  30  -  30    Minutes  10  10  -  10    METs  2.75  2.75  -  2.75      Track   Laps  11  11  -  12    Minutes  10  10  -  10    METs  2.91  2.91  -  3.07      Home Exercise Plan   Plans to continue exercise at  -  -  Home (comment)  Home (comment)    Frequency  -  -  Add 4 additional days to program exercise sessions.  Add 4 additional days to program exercise sessions.    Initial Home Exercises Provided  -  -  07/07/18  07/07/18       Exercise Comments: Exercise Comments    Row Name 06/18/18 1336 07/07/18 1234 07/24/18 0802       Exercise Comments  Pt's first day of exercise. Pt responded well to workloads. Pt oriented to exercise prescriptions. Will continue to monitor and progress pt  as tolerated.   home exercise complete  Pt's home exercise program has been reviewed. Pt is not exercising at home. Will continue to motivate pt to exercise at home. Will continue to monitor and progress pt as tolerated.         Exercise Goals and Review: Exercise Goals    Row Name 06/10/18 0900             Exercise Goals   Increase Physical Activity  Yes       Intervention  Provide advice, education, support and counseling about physical activity/exercise needs.;Develop an individualized exercise prescription for aerobic and resistive training based on initial evaluation findings, risk stratification, comorbidities and participant's personal goals.       Expected Outcomes  Short Term: Attend rehab on a regular basis to increase amount of physical activity.;Long Term: Add in home exercise to make exercise part of routine and to increase amount of physical  activity.;Long Term: Exercising regularly at least 3-5 days a week.       Increase Strength and Stamina  Yes       Intervention  Provide advice, education, support and counseling about physical activity/exercise needs.;Develop an individualized exercise prescription for aerobic and resistive training based on initial evaluation findings, risk stratification, comorbidities and participant's personal goals.       Expected Outcomes  Short Term: Increase workloads from initial exercise prescription for resistance, speed, and METs.;Short Term: Perform resistance training exercises routinely during rehab and add in resistance training at home;Long Term: Improve cardiorespiratory fitness, muscular endurance and strength as measured by increased METs and functional capacity (6MWT)       Able to understand and use rate of perceived exertion (RPE) scale  Yes       Intervention  Provide education and explanation on how to use RPE scale       Expected Outcomes  Short Term: Able to use RPE daily in rehab to express subjective intensity level;Long Term:  Able to use RPE to guide intensity level when exercising independently       Knowledge and understanding of Target Heart Rate Range (THRR)  Yes       Intervention  Provide education and explanation of THRR including how the numbers were predicted and where they are located for reference       Expected Outcomes  Short Term: Able to state/look up THRR;Long Term: Able to use THRR to govern intensity when exercising independently;Short Term: Able to use daily as guideline for intensity in rehab       Able to check pulse independently  Yes       Intervention  Provide education and demonstration on how to check pulse in carotid and radial arteries.;Review the importance of being able to check your own pulse for safety during independent exercise       Expected Outcomes  Short Term: Able to explain why pulse checking is important during independent exercise;Long Term: Able  to check pulse independently and accurately       Understanding of Exercise Prescription  Yes       Intervention  Provide education, explanation, and written materials on patient's individual exercise prescription       Expected Outcomes  Short Term: Able to explain program exercise prescription;Long Term: Able to explain home exercise prescription to exercise independently          Exercise Goals Re-Evaluation : Exercise Goals Re-Evaluation    [place] Name 07/07/18 1233  Exercise Goal Re-Evaluation   Exercise Goals Review  Increase Physical Activity;Increase Strength and Stamina;Able to understand and use rate of perceived exertion (RPE) scale;Knowledge and understanding of Target Heart Rate Range (THRR);Able to check pulse independently;Understanding of Exercise Prescription       Comments  Reviewed HEP with pt. Pt voices understanding.           Discharge Exercise Prescription (Final Exercise Prescription Changes): Exercise Prescription Changes - 07/11/18 1417      Response to Exercise   Blood Pressure (Admit)  118/60    Blood Pressure (Exercise)  90/62    Blood Pressure (Exit)  102/60    Heart Rate (Admit)  77 bpm    Heart Rate (Exercise)  91 bpm    Heart Rate (Exit)  77 bpm    Rating of Perceived Exertion (Exercise)  13    Perceived Dyspnea (Exercise)  0    Symptoms  None    Comments  None    Duration  Progress to 30 minutes of  aerobic without signs/symptoms of physical distress    Intensity  THRR unchanged      Progression   Progression  Continue to progress workloads to maintain intensity without signs/symptoms of physical distress.    Average METs  2.7      Resistance Training   Training Prescription  Yes    Weight  4lbs    Reps  10-15    Time  10 Minutes      Interval Training   Interval Training  No      NuStep   Level  2    SPM  70    Minutes  10    METs  3.2      Arm Ergometer   Level  1    Watts  30    Minutes  10    METs  2.75       Track   Laps  12    Minutes  10    METs  3.07      Home Exercise Plan   Plans to continue exercise at  Home (comment)    Frequency  Add 4 additional days to program exercise sessions.    Initial Home Exercises Provided  07/07/18       Nutrition:  Target Goals: Understanding of nutrition guidelines, daily intake of sodium 1500mg , cholesterol 200mg , calories 30% from fat and 7% or less from saturated fats, daily to have 5 or more servings of fruits and vegetables.  Biometrics: Pre Biometrics - 06/10/18 1048      Pre Biometrics   Height  5\' 7"  (1.702 m)    Weight  89.7 kg    Waist Circumference  41 inches    Hip Circumference  41.75 inches    Waist to Hip Ratio  0.98 %    BMI (Calculated)  30.97    Triceps Skinfold  15.5 mm    % Body Fat  29.5 %    Grip Strength  35.5 kg    Flexibility  0 in    Single Leg Stand  1.56 seconds        Nutrition Therapy Plan and Nutrition Goals: Nutrition Therapy & Goals - 06/10/18 1559      Nutrition Therapy   Diet  heart healthy, carb modified      Personal Nutrition Goals   Nutrition Goal  Pt to identify and limit food sources of saturated fat, trans fat, refined carbohydrates and sodium  Personal Goal #2  Pt to identify food quantities necessary to achieve weight loss of 6-24 lbs. at graduation from cardiac rehab    Personal Goal #3  Pt to eat a variety of non-starchy vegetables      Intervention Plan   Intervention  Prescribe, educate and counsel regarding individualized specific dietary modifications aiming towards targeted core components such as weight, hypertension, lipid management, diabetes, heart failure and other comorbidities.    Expected Outcomes  Short Term Goal: Understand basic principles of dietary content, such as calories, fat, sodium, cholesterol and nutrients.;Long Term Goal: Adherence to prescribed nutrition plan.       Nutrition Assessments: Nutrition Assessments - 06/10/18 1608      MEDFICTS Scores   Pre  Score  39       Nutrition Goals Re-Evaluation: Nutrition Goals Re-Evaluation    Row Name 06/10/18 1559             Goals   Current Weight  197 lb 12 oz (89.7 kg)          Nutrition Goals Re-Evaluation: Nutrition Goals Re-Evaluation    [place] Name 06/10/18 1559             Goals   Current Weight  197 lb 12 oz (89.7 kg)          Nutrition Goals Discharge (Final Nutrition Goals Re-Evaluation): Nutrition Goals Re-Evaluation - 06/10/18 1559      Goals   Current Weight  197 lb 12 oz (89.7 kg)       Psychosocial: Target Goals: Acknowledge presence or absence of significant depression and/or stress, maximize coping skills, provide positive support system. Participant is able to verbalize types and ability to use techniques and skills needed for reducing stress and depression.  Initial Review & Psychosocial Screening: Initial Psych Review & Screening - 06/10/18 1140      Initial Review   Current issues with  None Identified      Family Dynamics   Good Support System?  Yes   family/friends      Barriers   Psychosocial barriers to participate in program  There are no identifiable barriers or psychosocial needs.      Screening Interventions   Interventions  Encouraged to exercise       Quality of Life Scores: Quality of Life - 06/10/18 1103      Quality of Life   Select  Quality of Life      Quality of Life Scores   Health/Function Pre  20.7 %    Socioeconomic Pre  26.8 %    Psych/Spiritual Pre  23.7 %    Family Pre  30 %    GLOBAL Pre  24 %      Scores of 19 and below usually indicate a poorer quality of life in these areas.  A difference of  2-3 points is a clinically meaningful difference.  A difference of 2-3 points in the total score of the Quality of Life Index has been associated with significant improvement in overall quality of life, self-image, physical symptoms, and general health in studies assessing change in quality of life.  PHQ-9: Recent  Review Flowsheet Data    Depression screen [facility] 2/9 06/18/2018   Decreased Interest 0   Down, Depressed, Hopeless 1   PHQ - 2 Score 1     Interpretation of Total Score  Total Score Depression Severity:  1-4 = Minimal depression, 5-9 = Mild depression, 10-14 = Moderate depression, 15-19 = Moderately  severe depression, 20-27 = Severe depression   Psychosocial Evaluation and Intervention: Psychosocial Evaluation - 06/18/18 1521      Psychosocial Evaluation & Interventions   Interventions  Stress management education;Encouraged to exercise with the program and follow exercise prescription;Relaxation education    Comments  Nathan Medina has a history of depression but reports that he is managing it well at the moment.      Expected Outcomes  Nathan Medina will report continued ability to manage his depression.     Continue Psychosocial Services   Follow up required by staff       Psychosocial Re-Evaluation: Psychosocial Re-Evaluation    [place] Name 06/26/18 1400 07/24/18 1444           Psychosocial Re-Evaluation   Current issues with  History of Depression  History of Depression      Comments  No intervention necessay.   Nathan Medina reports increased fatigue.  Emotional support given.       Expected Outcomes  Nathan Medina will continue to manage his depression.  Nathan Medina will continue to manage his depression.      Interventions  Stress management education;Relaxation education;Encouraged to attend Cardiac Rehabilitation for the exercise  Stress management education;Relaxation education;Encouraged to attend Cardiac Rehabilitation for the exercise      Continue Psychosocial Services   Follow up required by staff  Follow up required by staff         Psychosocial Discharge (Final Psychosocial Re-Evaluation): Psychosocial Re-Evaluation - 07/24/18 1444      Psychosocial Re-Evaluation   Current issues with  History of Depression    Comments  Nathan Medina reports increased fatigue.  Emotional support given.     Expected  Outcomes  Nathan Medina will continue to manage his depression.    Interventions  Stress management education;Relaxation education;Encouraged to attend Cardiac Rehabilitation for the exercise    Continue Psychosocial Services   Follow up required by staff       Vocational Rehabilitation: Provide vocational rehab assistance to qualifying candidates.   Vocational Rehab Evaluation & Intervention: Vocational Rehab - 06/18/18 1522      Initial Vocational Rehab Evaluation & Intervention   Assessment shows need for Vocational Rehabilitation  No       Education: Education Goals: Education classes will be provided on a weekly basis, covering required topics. Participant will state understanding/return demonstration of topics presented.  Learning Barriers/Preferences: Learning Barriers/Preferences - 06/10/18 1050      Learning Barriers/Preferences   Learning Barriers  Hearing;Sight    Learning Preferences  Audio;Computer/Internet;Group Instruction;Individual Instruction;Pictoral;Skilled Demonstration;Verbal Instruction;Video;Written Material       Education Topics: Count Your Pulse:  -Group instruction provided by verbal instruction, demonstration, patient participation and written materials to support subject.  Instructors address importance of being able to find your pulse and how to count your pulse when at home without a heart monitor.  Patients get hands on experience counting their pulse with staff help and individually.   CARDIAC REHAB PHASE II EXERCISE from 07/23/2018 in [place]  Date  07/18/18  Educator  RN  Instruction Review Code  2- Demonstrated Understanding      Heart Attack, Angina, and Risk Factor Modification:  -Group instruction provided by verbal instruction, video, and written materials to support subject.  Instructors address signs and symptoms of angina and heart attacks.    Also discuss risk factors for heart disease and how to make  changes to improve heart health risk factors.   Functional Fitness:  -Group instruction provided  by verbal instruction, demonstration, patient participation, and written materials to support subject.  Instructors address safety measures for doing things around the house.  Discuss how to get up and down off the floor, how to pick things up properly, how to safely get out of a chair without assistance, and balance training.   Meditation and Mindfulness:  -Group instruction provided by verbal instruction, patient participation, and written materials to support subject.  Instructor addresses importance of mindfulness and meditation practice to help reduce stress and improve awareness.  Instructor also leads participants through a meditation exercise.    CARDIAC REHAB PHASE II EXERCISE from 07/23/2018 in [place]  Date  07/23/18  Educator  RN  Instruction Review Code  2- Demonstrated Understanding      Stretching for Flexibility and Mobility:  -Group instruction provided by verbal instruction, patient participation, and written materials to support subject.  Instructors lead participants through series of stretches that are designed to increase flexibility thus improving mobility.  These stretches are additional exercise for major muscle groups that are typically performed during regular warm up and cool down.   Hands Only CPR:  -Group verbal, video, and participation provides a basic overview of AHA guidelines for community CPR. Role-play of emergencies allow participants the opportunity to practice calling for help and chest compression technique with discussion of AED use.   Hypertension: -Group verbal and written instruction that provides a basic overview of hypertension including the most recent diagnostic guidelines, risk factor reduction with self-care instructions and medication management.   CARDIAC REHAB PHASE II EXERCISE from 07/23/2018 in [place]  Date  06/27/18  Educator  RN  Instruction Review Code  2- Demonstrated Understanding       Nutrition I class: Heart Healthy Eating:  -Group instruction provided by PowerPoint slides, verbal discussion, and written materials to support subject matter. The instructor gives an explanation and review of the Therapeutic Lifestyle Changes diet recommendations, which includes a discussion on lipid goals, dietary fat, sodium, fiber, plant stanol/sterol esters, sugar, and the components of a well-balanced, healthy diet.   Nutrition II class: Lifestyle Skills:  -Group instruction provided by PowerPoint slides, verbal discussion, and written materials to support subject matter. The instructor gives an explanation and review of label reading, grocery shopping for heart health, heart healthy recipe modifications, and ways to make healthier choices when eating out.   Diabetes Question & Answer:  -Group instruction provided by PowerPoint slides, verbal discussion, and written materials to support subject matter. The instructor gives an explanation and review of diabetes co-morbidities, pre- and post-prandial blood glucose goals, pre-exercise blood glucose goals, signs, symptoms, and treatment of hypoglycemia and hyperglycemia, and foot care basics.   Diabetes Blitz:  -Group instruction provided by PowerPoint slides, verbal discussion, and written materials to support subject matter. The instructor gives an explanation and review of the physiology behind type 1 and type 2 diabetes, diabetes medications and rational behind using different medications, pre- and post-prandial blood glucose recommendations and Hemoglobin A1c goals, diabetes diet, and exercise including blood glucose guidelines for exercising safely.    Portion Distortion:  -Group instruction provided by PowerPoint slides, verbal discussion, written materials, and food models to support subject matter. The  instructor gives an explanation of serving size versus portion size, changes in portions sizes over the last 20 years, and what consists of a serving from each food group.   Stress Management:  -Group instruction provided by verbal instruction,  video, and written materials to support subject matter.  Instructors review role of stress in heart disease and how to cope with stress positively.     CARDIAC REHAB PHASE II EXERCISE from 07/23/2018 in [place]  Date  06/18/18  Educator  RN  Instruction Review Code  2- Demonstrated Understanding      Exercising on Your Own:  -Group instruction provided by verbal instruction, power point, and written materials to support subject.  Instructors discuss benefits of exercise, components of exercise, frequency and intensity of exercise, and end points for exercise.  Also discuss use of nitroglycerin and activating EMS.  Review options of places to exercise outside of rehab.  Review guidelines for sex with heart disease.   CARDIAC REHAB PHASE II EXERCISE from 07/23/2018 in [place]  Date  07/16/18  Educator  EP  Instruction Review Code  2- Demonstrated Understanding      Cardiac Drugs I:  -Group instruction provided by verbal instruction and written materials to support subject.  Instructor reviews cardiac drug classes: antiplatelets, anticoagulants, beta blockers, and statins.  Instructor discusses reasons, side effects, and lifestyle considerations for each drug class.   Cardiac Drugs II:  -Group instruction provided by verbal instruction and written materials to support subject.  Instructor reviews cardiac drug classes: angiotensin converting enzyme inhibitors (ACE-I), angiotensin II receptor blockers (ARBs), nitrates, and calcium channel blockers.  Instructor discusses reasons, side effects, and lifestyle considerations for each drug class.   CARDIAC REHAB PHASE II EXERCISE from 07/23/2018  in [place]  Date  07/02/18  Instruction Review Code  2- Demonstrated Understanding      Anatomy and Physiology of the Circulatory System:  Group verbal and written instruction and models provide basic cardiac anatomy and physiology, with the coronary electrical and arterial systems. Review of: AMI, Angina, Valve disease, Heart Failure, Peripheral Artery Disease, Cardiac Arrhythmia, Pacemakers, and the ICD.   Other Education:  -Group or individual verbal, written, or video instructions that support the educational goals of the cardiac rehab program.   Holiday Eating Survival Tips:  -Group instruction provided by PowerPoint slides, verbal discussion, and written materials to support subject matter. The instructor gives patients tips, tricks, and techniques to help them not only survive but enjoy the holidays despite the onslaught of food that accompanies the holidays.   Knowledge Questionnaire Score: Knowledge Questionnaire Score - 06/10/18 1050      Knowledge Questionnaire Score   Pre Score  19/24       Core Components/Risk Factors/Patient Goals at Admission: Personal Goals and Risk Factors at Admission - 06/10/18 1051      Core Components/Risk Factors/Patient Goals on Admission    Weight Management  Yes;Weight Maintenance;Weight Loss    Admit Weight  197 lb 12 oz (89.7 kg)    Expected Outcomes  Short Term: Continue to assess and modify interventions until short term weight is achieved;Long Term: Adherence to nutrition and physical activity/exercise program aimed toward attainment of established weight goal;Weight Maintenance: Understanding of the daily nutrition guidelines, which includes 25-35% calories from fat, 7% or less cal from saturated fats, less than 200mg  cholesterol, less than 1.5gm of sodium, & 5 or more servings of fruits and vegetables daily;Weight Loss: Understanding of general recommendations for a balanced deficit meal plan, which  promotes 1-2 lb weight loss per week and includes a negative energy balance of [phone] kcal/d;Understanding recommendations for meals to include 15-35% energy as protein,  25-35% energy from fat, 35-60% energy from carbohydrates, less than 200mg  of dietary cholesterol, 20-35 gm of total fiber daily;Understanding of distribution of calorie intake throughout the day with the consumption of 4-5 meals/snacks    Diabetes  Yes    Intervention  Provide education about signs/symptoms and action to take for hypo/hyperglycemia.;Provide education about proper nutrition, including hydration, and aerobic/resistive exercise prescription along with prescribed medications to achieve blood glucose in normal ranges: Fasting glucose 65-99 mg/dL    Expected Outcomes  Short Term: Participant verbalizes understanding of the signs/symptoms and immediate care of hyper/hypoglycemia, proper foot care and importance of medication, aerobic/resistive exercise and nutrition plan for blood glucose control.;Long Term: Attainment of HbA1C < 7%.    Hypertension  Yes    Intervention  Provide education on lifestyle modifcations including regular physical activity/exercise, weight management, moderate sodium restriction and increased consumption of fresh fruit, vegetables, and low fat dairy, alcohol moderation, and smoking cessation.;Monitor prescription use compliance.    Expected Outcomes  Short Term: Continued assessment and intervention until BP is < 140/22mm HG in hypertensive participants. < 130/17mm HG in hypertensive participants with diabetes, heart failure or chronic kidney disease.;Long Term: Maintenance of blood pressure at goal levels.    Lipids  Yes    Intervention  Provide education and support for participant on nutrition & aerobic/resistive exercise along with prescribed medications to achieve LDL 70mg , HDL >40mg .    Expected Outcomes  Short Term: Participant states understanding of desired cholesterol values and is compliant  with medications prescribed. Participant is following exercise prescription and nutrition guidelines.;Long Term: Cholesterol controlled with medications as prescribed, with individualized exercise RX and with personalized nutrition plan. Value goals: LDL < 70mg , HDL > 40 mg.    Stress  Yes    Intervention  Offer individual and/or small group education and counseling on adjustment to heart disease, stress management and health-related lifestyle change. Teach and support self-help strategies.;Refer participants experiencing significant psychosocial distress to appropriate mental health specialists for further evaluation and treatment. When possible, include family members and significant others in education/counseling sessions.    Expected Outcomes  Short Term: Participant demonstrates changes in health-related behavior, relaxation and other stress management skills, ability to obtain effective social support, and compliance with psychotropic medications if prescribed.;Long Term: Emotional wellbeing is indicated by absence of clinically significant psychosocial distress or social isolation.       Core Components/Risk Factors/Patient Goals Review:  Goals and Risk Factor Review    Row Name 06/18/18 1522 07/24/18 1444           Core Components/Risk Factors/Patient Goals Review   Personal Goals Review  Diabetes;Hypertension;Stress;Lipids;Weight Management/Obesity  Diabetes;Hypertension;Stress;Lipids;Weight Management/Obesity      Review  Pt with multiple CAD RFs willing to participate in CR exercise.  Mitchelle would like to increase his stamina   Pt with multiple CAD RFs willing to participate in CR exercise.  Nathan Medina is tolerating exercise well.  VSS.  He plans to only complete 26 sessions.       Expected Outcomes  Pt will continue to participate in CR exercise, nutrition, and lifestyle modification opportunities.   Pt will continue to participate in CR exercise, nutrition, and lifestyle modification  opportunities.          Core Components/Risk Factors/Patient Goals at Discharge (Final Review):  Goals and Risk Factor Review - 07/24/18 1444      Core Components/Risk Factors/Patient Goals Review   Personal Goals Review  Diabetes;Hypertension;Stress;Lipids;Weight Management/Obesity    Review  Pt with  multiple CAD RFs willing to participate in CR exercise.  Nathan Medina is tolerating exercise well.  VSS.  He plans to only complete 26 sessions.     Expected Outcomes  Pt will continue to participate in CR exercise, nutrition, and lifestyle modification opportunities.        ITP Comments: ITP Comments    Row Name 06/10/18 0851 06/18/18 1520 07/24/18 1434       ITP Comments  Medical Director- Dr. Fransico Him, MD  30 Day ITP Review.  Pt started exercise today and tolerated it well.  30 Day ITP Review.  Pt is tolerating exercise.  He feels that he is fatigued more than he has been.  He has not slept as well d/t tingling in his legs.         Comments: See ITP Comments.

## 2018-07-25 ENCOUNTER — Encounter (HOSPITAL_COMMUNITY)
Admission: RE | Admit: 2018-07-25 | Discharge: 2018-07-25 | Disposition: A | Discharge status: Home / Self Care | Payer: Medicare Other | Source: Ambulatory Visit | Attending: Cardiology | Admitting: Cardiology

## 2018-07-25 ENCOUNTER — Ambulatory Visit (HOSPITAL_COMMUNITY): Payer: Medicare Other

## 2018-07-25 DIAGNOSIS — Z951 Presence of aortocoronary bypass graft: Secondary | ICD-10-CM

## 2018-07-28 ENCOUNTER — Encounter (HOSPITAL_COMMUNITY)
Admission: RE | Admit: 2018-07-28 | Discharge: 2018-07-28 | Disposition: A | Discharge status: Home / Self Care | Payer: Medicare Other | Source: Ambulatory Visit | Attending: Cardiology | Admitting: Cardiology

## 2018-07-28 ENCOUNTER — Ambulatory Visit (HOSPITAL_COMMUNITY): Payer: Medicare Other

## 2018-07-28 DIAGNOSIS — Z951 Presence of aortocoronary bypass graft: Secondary | ICD-10-CM

## 2018-07-30 ENCOUNTER — Ambulatory Visit (HOSPITAL_COMMUNITY): Payer: Medicare Other

## 2018-07-30 ENCOUNTER — Other Ambulatory Visit: Payer: Self-pay

## 2018-07-30 ENCOUNTER — Encounter (HOSPITAL_COMMUNITY)
Admission: RE | Admit: 2018-07-30 | Discharge: 2018-07-30 | Disposition: A | Discharge status: Home / Self Care | Payer: Medicare Other | Source: Ambulatory Visit | Attending: Cardiology | Admitting: Cardiology

## 2018-07-30 DIAGNOSIS — Z951 Presence of aortocoronary bypass graft: Secondary | ICD-10-CM | POA: Diagnosis not present

## 2018-07-31 ENCOUNTER — Ambulatory Visit: Payer: Self-pay | Admitting: Cardiology

## 2018-08-01 ENCOUNTER — Ambulatory Visit (HOSPITAL_COMMUNITY): Payer: Medicare Other

## 2018-08-01 ENCOUNTER — Other Ambulatory Visit: Payer: Self-pay

## 2018-08-01 ENCOUNTER — Encounter (HOSPITAL_COMMUNITY)
Admission: RE | Admit: 2018-08-01 | Discharge: 2018-08-01 | Disposition: A | Discharge status: Home / Self Care | Payer: Medicare Other | Source: Ambulatory Visit | Attending: Cardiology | Admitting: Cardiology

## 2018-08-01 DIAGNOSIS — Z951 Presence of aortocoronary bypass graft: Secondary | ICD-10-CM

## 2018-08-04 ENCOUNTER — Ambulatory Visit (HOSPITAL_COMMUNITY): Payer: Medicare Other

## 2018-08-04 ENCOUNTER — Encounter (HOSPITAL_COMMUNITY): Payer: Medicare Other

## 2018-08-04 ENCOUNTER — Telehealth (HOSPITAL_COMMUNITY): Payer: Self-pay | Admitting: Internal Medicine

## 2018-08-06 ENCOUNTER — Encounter (HOSPITAL_COMMUNITY): Payer: Medicare Other

## 2018-08-06 ENCOUNTER — Ambulatory Visit (HOSPITAL_COMMUNITY): Payer: Medicare Other

## 2018-08-07 ENCOUNTER — Ambulatory Visit: Payer: Medicare Other | Admitting: Cardiothoracic Surgery

## 2018-08-07 ENCOUNTER — Other Ambulatory Visit: Payer: Medicare Other

## 2018-08-08 ENCOUNTER — Ambulatory Visit (HOSPITAL_COMMUNITY): Payer: Medicare Other

## 2018-08-08 ENCOUNTER — Encounter (HOSPITAL_COMMUNITY): Payer: Medicare Other

## 2018-08-11 ENCOUNTER — Ambulatory Visit (HOSPITAL_COMMUNITY): Payer: Medicare Other

## 2018-08-11 ENCOUNTER — Encounter (HOSPITAL_COMMUNITY): Payer: Medicare Other

## 2018-08-13 ENCOUNTER — Ambulatory Visit (HOSPITAL_COMMUNITY): Payer: Medicare Other

## 2018-08-13 ENCOUNTER — Encounter (HOSPITAL_COMMUNITY): Payer: Medicare Other

## 2018-08-14 ENCOUNTER — Telehealth (HOSPITAL_COMMUNITY): Payer: Self-pay

## 2018-08-14 NOTE — Progress Notes (Signed)
Cardiac Individual Treatment Plan  Patient Details  Name: Nathan Medina MRN: [id number] Date of Birth: 28-Mar-1951 Referring Provider:     [place] from 06/10/2018 in [place]  Referring Provider  Dr. 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  acetaminophen (TYLENOL) 325 MG tablet, Take 2 tablets (650 mg total) by mouth every 6 (six) hours as needed for mild pain (or Fever >/= 101)., Disp: 20 tablet, Rfl: 0 .  allopurinol (ZYLOPRIM) 300 MG tablet, TAKE 1 TABLET BY MOUTH EVERY DAY (Patient taking differently: Take 300 mg by mouth daily. ), Disp: 90 tablet, Rfl: 0 .  ALPRAZolam (XANAX) 0.5 MG tablet, Take 1 tablet (0.5 mg total) by mouth daily as needed for sleep. 1/2 - 1 by mouth once daily as needed (Patient taking differently: Take 0.25-0.5 mg by mouth at bedtime as needed for sleep. ), Disp: 90 tablet, Rfl: 2 .  aspirin EC 81 MG tablet, Take 81 mg by mouth daily., Disp: , Rfl:  .  aspirin-acetaminophen-caffeine (EXCEDRIN EXTRA STRENGTH) 250-250-65 MG tablet, Take 1-2 tablets by mouth every 6 (six) hours as needed for headache., Disp: , Rfl:  .  atorvastatin (LIPITOR) 10 MG tablet, Take 10 mg by mouth at bedtime., Disp: , Rfl:  .  calcium carbonate (TUMS) 500 MG chewable tablet, Chew 2 tablets (400 mg of elemental calcium total) by mouth 2 (two) times daily. (Patient taking differently: Chew 2 tablets by mouth 2 (two) times daily as needed for indigestion. ), Disp: 90 tablet, Rfl: 1 .  carvedilol (COREG) 3.125 MG tablet, TAKE 1 TABLET BY MOUTH TWICE DAILY, Disp: 60 tablet, Rfl: 0 .  Cholecalciferol (VITAMIN D-3) 5000 units TABS, Take 5,000 Units by mouth at bedtime., Disp: , Rfl:  .  cyclobenzaprine  (FLEXERIL) 5 MG tablet, Take 5 mg by mouth 3 (three) times daily as needed for muscle spasms., Disp: , Rfl:  .  diphenhydrAMINE (BENADRYL) 25 MG tablet, Take 25 mg by mouth at bedtime as needed for sleep., Disp: , Rfl:  .  furosemide (LASIX) 40 MG tablet, Take 120 mg by mouth daily. , Disp: , Rfl:  .  Guaifenesin 1200 MG TB12, Take 1,200 mg by mouth 2 (two) times daily as needed (cough)., Disp: , Rfl:  .  insulin glargine (LANTUS) 100 unit/mL SOPN, Inject 40 Units into the skin at bedtime. , Disp: , Rfl:  .  levothyroxine (SYNTHROID, LEVOTHROID) 175 MCG tablet, Take 200 mcg by mouth daily before breakfast. , Disp: , Rfl:  .  Multiple Vitamins-Minerals (PRESERVISION AREDS 2 PO), Take 1 tablet by mouth 2 (two) times daily., Disp: , Rfl:  .  omega-3 acid ethyl esters (LOVAZA) 1 g capsule, Take 2 g by mouth 2 (two) times daily. , Disp: , Rfl:  .  omeprazole (PRILOSEC) 20 MG capsule, Take 20 mg by mouth daily before breakfast. , Disp: , Rfl:  .  sacubitril-valsartan (ENTRESTO) 49-51 MG, Take 1 tablet by mouth 2 (two) times daily., Disp: 60 tablet, Rfl: 3 .  sildenafil (REVATIO) 20 MG tablet, Take 100 mg by mouth daily as needed. , Disp: , Rfl:  .  spironolactone (ALDACTONE) 25 MG tablet, Take 1 tablet (25 mg total) by mouth  daily., Disp: 90 tablet, Rfl: 3 .  traMADol (ULTRAM) 50 MG tablet, Take 1-2 tablets (50-100 mg total) by mouth every 6 (six) hours as needed for moderate pain., Disp: 24 tablet, Rfl: 0 .  triamcinolone cream (KENALOG) 0.1 %, Apply 1 application topically 2 (two) times daily as needed (skin irritation)., Disp: , Rfl:  .  vitamin E (VITAMIN E) 1000 UNIT capsule, Take 1,000 Units by mouth at bedtime., Disp: , Rfl:  .  XARELTO 2.5 MG TABS tablet, TAKE 1 TABLET BY MOUTH TWICE DAILY, Disp: 60 tablet, Rfl: 0  Past Medical History: Past Medical History:  Diagnosis Date  . Abnormal liver function test 05/23/2011  . ALLERGIC RHINITIS   . ANXIETY   . Aortic atherosclerosis ([place])   .  Arthritis   . Ascending aorta dilation ([facility]) 07/24/2018  . Bilateral renal cysts   . Cervical spondylolysis    Moderate  . CHF (congestive heart failure) ([place]) 07/24/2018  . Chronic kidney disease    CKD stage 3 per office visit note 08/08/17 on chart   . COLONIC POLYPS, HX OF   . Coronary artery disease   . DEPRESSION   . DIABETES MELLITUS, TYPE II   . Diverticulitis   . Epidermal cyst   . Fatty liver   . GERD   . GOUT   . History of inguinal hernia   . HOH (hard of hearing)    elft ear  . HYPERLIPIDEMIA   . HYPERTENSION   . IBS   . 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 Ischemic cardiomyopathy 07/24/2018  . Left renal mass 05/23/2011  . Lumbar radicular pain 06/03/2011  . Macular degeneration    right eye  . Multinodular goiter   . Pancreatic pseudocyst    Stable  . Peritonsillar abscess   . Pulmonary nodule    Right upper lobe  . Radial neck fracture   . Right upper lobe pulmonary nodule 07/24/2018  . Shortness of breath    occasional shortness of breath  with exertion  . Thyroid disease   . Thyroid nodule    Bilateral  . Vertigo     Tobacco Use: Social History   Tobacco Use  Smoking Status Never Smoker  Smokeless Tobacco Never Used    Labs: Recent Review Flowsheet Data    Labs for ITP Cardiac and Pulmonary Rehab Latest Ref Rng & Units 04/01/2018 04/01/2018 04/02/2018 04/04/2018 07/01/2018   Cholestrol 100 - 199 mg/dL - - - - 112   LDLCALC 0 - 99 mg/dL - - - - 53   LDLDIRECT mg/dL - - - - -   HDL >39 mg/dL - - - - 32(L)   Trlycerides 0 - 149 mg/dL - - - - 134   Hemoglobin A1c 4.8 - 5.6 % - - - - -   PHART 7.350 - 7.450 - 7.372 - - -   PCO2ART 32.0 - 48.0 mmHg - 40.2 - - -   HCO3 20.0 - 28.0 mmol/L - 23.2 - - -   TCO2 22 - 32 mmol/L 24 24 26  - -   ACIDBASEDEF 0.0 - 2.0 mmol/L - 2.0 - - -   O2SAT % - 89.0 - 69.6 -      Capillary Blood Glucose: Lab Results  Component  Value Date   GLUCAP 159 (H) 06/20/2018   GLUCAP 139 (H) 06/18/2018   GLUCAP 135 (H) 06/18/2018   GLUCAP 164 (H) 04/09/2018  GLUCAP 126 (H) 04/09/2018     Exercise Target Goals: Exercise Program Goal: Individual exercise prescription set using results from initial 6 min walk test and THRR while considering  patient's activity barriers and safety.   Exercise Prescription Goal: Initial exercise prescription builds to 30-45 minutes a day of aerobic activity, 2-3 days per week.  Home exercise guidelines will be given to patient during program as part of exercise prescription that the participant will acknowledge.  Activity Barriers & Risk Stratification: Activity Barriers & Cardiac Risk Stratification - 06/10/18 1038      Activity Barriers & Cardiac Risk Stratification   Activity Barriers  Arthritis;Joint Problems    Cardiac Risk Stratification  High       6 Minute Walk: 6 Minute Walk    Row Name 06/10/18 1037         6 Minute Walk   Phase  Initial     Distance  1157 feet     Walk Time  6 minutes     # of Rest Breaks  0     MPH  2.19     METS  2.07     RPE  12     VO2 Peak  7.25     Symptoms  No     Resting HR  72 bpm     Resting BP  108/58     Resting Oxygen Saturation   96 %     Exercise Oxygen Saturation  during 6 min walk  97 %     Max Ex. HR  64 bpm     Max Ex. BP  102/54     2 Minute Post BP  102/78        Oxygen Initial Assessment:   Oxygen Re-Evaluation:   Oxygen Discharge (Final Oxygen Re-Evaluation):   Initial Exercise Prescription: Initial Exercise Prescription - 06/10/18 1100      Date of Initial Exercise RX and Referring Provider   Date  06/10/18    Referring Provider  Dr. Virgina Jock    Expected Discharge Date  09/15/18      NuStep   Level  2    SPM  75    Minutes  10    METs  2      Arm Ergometer   Level  1    Watts  30    Minutes  10    METs  2.75      Track   Laps  7    Minutes  10    METs  2.23      Prescription Details    Frequency (times per week)  3    Duration  Progress to 30 minutes of continuous aerobic without signs/symptoms of physical distress      Intensity   THRR 40-80% of Max Heartrate  61-122    Ratings of Perceived Exertion  11-13      Progression   Progression  Continue to progress workloads to maintain intensity without signs/symptoms of physical distress.      Resistance Training   Training Prescription  Yes    Weight  2 lbs.     Reps  10-15       Perform Capillary Blood Glucose checks as needed.  Exercise Prescription Changes: Exercise Prescription Changes    Row Name 06/18/18 1300 06/27/18 1416 07/07/18 1200 07/11/18 1417 07/21/18 1432     Response to Exercise   Blood Pressure (Admit)  102/64  112/62  -  118/60  120/70  Blood Pressure (Exercise)  122/68  120/70  -  90/62  128/70   Blood Pressure (Exit)  104/64  98/70  -  102/60  128/70   Heart Rate (Admit)  72 bpm  77 bpm  -  77 bpm  70 bpm   Heart Rate (Exercise)  87 bpm  86 bpm  -  91 bpm  95 bpm   Heart Rate (Exit)  72 bpm  77 bpm  -  77 bpm  70 bpm   Rating of Perceived Exertion (Exercise)  12  13  -  13  11   Perceived Dyspnea (Exercise)  0  0  -  0  0   Symptoms  None  None  -  None  None   Comments  Pt oriented to exercise equipment  None  -  None  None   Duration  Progress to 30 minutes of  aerobic without signs/symptoms of physical distress  Progress to 30 minutes of  aerobic without signs/symptoms of physical distress  -  Progress to 30 minutes of  aerobic without signs/symptoms of physical distress  Progress to 30 minutes of  aerobic without signs/symptoms of physical distress   Intensity  THRR unchanged  THRR unchanged  -  THRR unchanged  THRR unchanged     Progression   Progression  Continue to progress workloads to maintain intensity without signs/symptoms of physical distress.  Continue to progress workloads to maintain intensity without signs/symptoms of physical distress.  -  Continue to progress workloads  to maintain intensity without signs/symptoms of physical distress.  Continue to progress workloads to maintain intensity without signs/symptoms of physical distress.   Average METs  2.39  2.53  -  2.7  3.3     Resistance Training   Training Prescription  No  Yes  -  Yes  Yes   Weight  -  2lbs  -  4lbs  5lbs   Reps  -  10-15  -  10-15  10-15   Time  -  10 Minutes  -  10 Minutes  10 Minutes     Interval Training   Interval Training  No  No  -  No  No     NuStep   Level  2  2  -  2  3   SPM  70  70  -  70  95   Minutes  10  10  -  10  10   METs  1.5  1.9  -  3.2  4.2     Arm Ergometer   Level  1  1  -  1  1   Watts  30  30  -  30  30   Minutes  10  10  -  10  10   METs  2.75  2.75  -  2.75  2.77     Track   Laps  11  11  -  12  12   Minutes  10  10  -  10  10   METs  2.91  2.91  -  3.07  3.07     Home Exercise Plan   Plans to continue exercise at  -  -  Home (comment)  Home (comment)  Home (comment) Walking   Frequency  -  -  Add 4 additional days to program exercise sessions.  Add 4 additional days to program exercise sessions.  Add 4 additional days to program exercise  sessions.   Initial Home Exercises Provided  -  -  07/07/18  07/07/18  07/07/18   Row Name 08/01/18 1434             Response to Exercise   Blood Pressure (Admit)  116/70       Blood Pressure (Exercise)  122/64       Blood Pressure (Exit)  104/68       Heart Rate (Admit)  85 bpm       Heart Rate (Exercise)  92 bpm       Heart Rate (Exit)  73 bpm       Rating of Perceived Exertion (Exercise)  11       Perceived Dyspnea (Exercise)  0       Symptoms  None       Comments  None       Duration  Progress to 30 minutes of  aerobic without signs/symptoms of physical distress       Intensity  THRR unchanged         Progression   Progression  Continue to progress workloads to maintain intensity without signs/symptoms of physical distress.       Average METs  2.83         Resistance Training   Training  Prescription  Yes       Weight  5lbs       Reps  10-15       Time  10 Minutes         Interval Training   Interval Training  No         NuStep   Level  3       SPM  95       Minutes  10       METs  2.8         Arm Ergometer   Level  1       Watts  30       Minutes  10       METs  2.78         Track   Laps  11       Minutes  10       METs  2.91         Home Exercise Plan   Plans to continue exercise at  Home (comment) Walking       Frequency  Add 4 additional days to program exercise sessions.       Initial Home Exercises Provided  07/07/18          Exercise Comments: Exercise Comments    Row Name 06/18/18 1336 07/07/18 1234 07/24/18 0802 08/08/18 1436     Exercise Comments  Pt's first day of exercise. Pt responded well to workloads. Pt oriented to exercise prescriptions. Will continue to monitor and progress pt as tolerated.   home exercise complete  Pt's home exercise program has been reviewed. Pt is not exercising at home. Will continue to motivate pt to exercise at home. Will continue to monitor and progress pt as tolerated.   Pt has been responding well to exercise prescription. Exercise currently on hold due to department closure per CDC recommendation to help prevent COVID-19 spread.        Exercise Goals and Review: Exercise Goals    Row Name 06/10/18 0900             Exercise Goals   Increase Physical Activity  Yes  Intervention  Provide advice, education, support and counseling about physical activity/exercise needs.;Develop an individualized exercise prescription for aerobic and resistive training based on initial evaluation findings, risk stratification, comorbidities and participant's personal goals.       Expected Outcomes  Short Term: Attend rehab on a regular basis to increase amount of physical activity.;Long Term: Add in home exercise to make exercise part of routine and to increase amount of physical activity.;Long Term: Exercising regularly  at least 3-5 days a week.       Increase Strength and Stamina  Yes       Intervention  Provide advice, education, support and counseling about physical activity/exercise needs.;Develop an individualized exercise prescription for aerobic and resistive training based on initial evaluation findings, risk stratification, comorbidities and participant's personal goals.       Expected Outcomes  Short Term: Increase workloads from initial exercise prescription for resistance, speed, and METs.;Short Term: Perform resistance training exercises routinely during rehab and add in resistance training at home;Long Term: Improve cardiorespiratory fitness, muscular endurance and strength as measured by increased METs and functional capacity (6MWT)       Able to understand and use rate of perceived exertion (RPE) scale  Yes       Intervention  Provide education and explanation on how to use RPE scale       Expected Outcomes  Short Term: Able to use RPE daily in rehab to express subjective intensity level;Long Term:  Able to use RPE to guide intensity level when exercising independently       Knowledge and understanding of Target Heart Rate Range (THRR)  Yes       Intervention  Provide education and explanation of THRR including how the numbers were predicted and where they are located for reference       Expected Outcomes  Short Term: Able to state/look up THRR;Long Term: Able to use THRR to govern intensity when exercising independently;Short Term: Able to use daily as guideline for intensity in rehab       Able to check pulse independently  Yes       Intervention  Provide education and demonstration on how to check pulse in carotid and radial arteries.;Review the importance of being able to check your own pulse for safety during independent exercise       Expected Outcomes  Short Term: Able to explain why pulse checking is important during independent exercise;Long Term: Able to check pulse independently and accurately        Understanding of Exercise Prescription  Yes       Intervention  Provide education, explanation, and written materials on patient's individual exercise prescription       Expected Outcomes  Short Term: Able to explain program exercise prescription;Long Term: Able to explain home exercise prescription to exercise independently          Exercise Goals Re-Evaluation : Exercise Goals Re-Evaluation    [place] Name 07/07/18 1233             Exercise Goal Re-Evaluation   Exercise Goals Review  Increase Physical Activity;Increase Strength and Stamina;Able to understand and use rate of perceived exertion (RPE) scale;Knowledge and understanding of Target Heart Rate Range (THRR);Able to check pulse independently;Understanding of Exercise Prescription       Comments  Reviewed HEP with pt. Pt voices understanding.           Discharge Exercise Prescription (Final Exercise Prescription Changes): Exercise Prescription Changes - 08/01/18 1434  Response to Exercise   Blood Pressure (Admit)  116/70    Blood Pressure (Exercise)  122/64    Blood Pressure (Exit)  104/68    Heart Rate (Admit)  85 bpm    Heart Rate (Exercise)  92 bpm    Heart Rate (Exit)  73 bpm    Rating of Perceived Exertion (Exercise)  11    Perceived Dyspnea (Exercise)  0    Symptoms  None    Comments  None    Duration  Progress to 30 minutes of  aerobic without signs/symptoms of physical distress    Intensity  THRR unchanged      Progression   Progression  Continue to progress workloads to maintain intensity without signs/symptoms of physical distress.    Average METs  2.83      Resistance Training   Training Prescription  Yes    Weight  5lbs    Reps  10-15    Time  10 Minutes      Interval Training   Interval Training  No      NuStep   Level  3    SPM  95    Minutes  10    METs  2.8      Arm Ergometer   Level  1    Watts  30    Minutes  10    METs  2.78      Track   Laps  11    Minutes  10    METs   2.91      Home Exercise Plan   Plans to continue exercise at  Home (comment)   Walking   Frequency  Add 4 additional days to program exercise sessions.    Initial Home Exercises Provided  07/07/18       Nutrition:  Target Goals: Understanding of nutrition guidelines, daily intake of sodium <1584m, cholesterol <2070m calories 30% from fat and 7% or less from saturated fats, daily to have 5 or more servings of fruits and vegetables.  Biometrics: Pre Biometrics - 06/10/18 1048      Pre Biometrics   Height  5' 7"  (1.702 m)    Weight  89.7 kg    Waist Circumference  41 inches    Hip Circumference  41.75 inches    Waist to Hip Ratio  0.98 %    BMI (Calculated)  30.97    Triceps Skinfold  15.5 mm    % Body Fat  29.5 %    Grip Strength  35.5 kg    Flexibility  0 in    Single Leg Stand  1.56 seconds        Nutrition Therapy Plan and Nutrition Goals: Nutrition Therapy & Goals - 06/10/18 1559      Nutrition Therapy   Diet  heart healthy, carb modified      Personal Nutrition Goals   Nutrition Goal  Pt to identify and limit food sources of saturated fat, trans fat, refined carbohydrates and sodium    Personal Goal #2  Pt to identify food quantities necessary to achieve weight loss of 6-24 lbs. at graduation from cardiac rehab    Personal Goal #3  Pt to eat a variety of non-starchy vegetables      Intervention Plan   Intervention  Prescribe, educate and counsel regarding individualized specific dietary modifications aiming towards targeted core components such as weight, hypertension, lipid management, diabetes, heart failure and other comorbidities.    Expected Outcomes  Short  Term Goal: Understand basic principles of dietary content, such as calories, fat, sodium, cholesterol and nutrients.;Long Term Goal: Adherence to prescribed nutrition plan.       Nutrition Assessments: Nutrition Assessments - 06/10/18 1608      MEDFICTS Scores   Pre Score  39       Nutrition  Goals Re-Evaluation: Nutrition Goals Re-Evaluation    Row Name 06/10/18 1559             Goals   Current Weight  197 lb 12 oz (89.7 kg)          Nutrition Goals Re-Evaluation: Nutrition Goals Re-Evaluation    [place] Name 06/10/18 1559             Goals   Current Weight  197 lb 12 oz (89.7 kg)          Nutrition Goals Discharge (Final Nutrition Goals Re-Evaluation): Nutrition Goals Re-Evaluation - 06/10/18 1559      Goals   Current Weight  197 lb 12 oz (89.7 kg)       Psychosocial: Target Goals: Acknowledge presence or absence of significant depression and/or stress, maximize coping skills, provide positive support system. Participant is able to verbalize types and ability to use techniques and skills needed for reducing stress and depression.  Initial Review & Psychosocial Screening: Initial Psych Review & Screening - 06/10/18 1140      Initial Review   Current issues with  None Identified      Family Dynamics   Good Support System?  Yes   family/friends      Barriers   Psychosocial barriers to participate in program  There are no identifiable barriers or psychosocial needs.      Screening Interventions   Interventions  Encouraged to exercise       Quality of Life Scores: Quality of Life - 06/10/18 1103      Quality of Life   Select  Quality of Life      Quality of Life Scores   Health/Function Pre  20.7 %    Socioeconomic Pre  26.8 %    Psych/Spiritual Pre  23.7 %    Family Pre  30 %    GLOBAL Pre  24 %      Scores of 19 and below usually indicate a poorer quality of life in these areas.  A difference of  2-3 points is a clinically meaningful difference.  A difference of 2-3 points in the total score of the Quality of Life Index has been associated with significant improvement in overall quality of life, self-image, physical symptoms, and general health in studies assessing change in quality of life.  PHQ-9: Recent Review Flowsheet Data     Depression screen [facility] 2/9 06/18/2018   Decreased Interest 0   Down, Depressed, Hopeless 1   PHQ - 2 Score 1     Interpretation of Total Score  Total Score Depression Severity:  1-4 = Minimal depression, 5-9 = Mild depression, 10-14 = Moderate depression, 15-19 = Moderately severe depression, 20-27 = Severe depression   Psychosocial Evaluation and Intervention: Psychosocial Evaluation - 06/18/18 1521      Psychosocial Evaluation & Interventions   Interventions  Stress management education;Encouraged to exercise with the program and follow exercise prescription;Relaxation education    Comments  Nathan Medina has a history of depression but reports that he is managing it well at the moment.      Expected Outcomes  Nathan Medina will report continued ability to manage his  depression.     Continue Psychosocial Services   Follow up required by staff       Psychosocial Re-Evaluation: Psychosocial Re-Evaluation    Nathan Medina Name 06/26/18 1400 07/24/18 1444 08/14/18 0919         Psychosocial Re-Evaluation   Current issues with  History of Depression  History of Depression  History of Depression     Comments  No intervention necessay.   Nathan Medina reports increased fatigue.  Emotional support given.   Nathan Medina reports impovement in his fatigue.  Emotional support given.      Expected Outcomes  Nathan Medina will continue to manage his depression.  Nathan Medina will continue to manage his depression.  Nathan Medina will continue to manage his depression.     Interventions  Stress management education;Relaxation education;Encouraged to attend Cardiac Rehabilitation for the exercise  Stress management education;Relaxation education;Encouraged to attend Cardiac Rehabilitation for the exercise  Stress management education;Relaxation education;Encouraged to attend Cardiac Rehabilitation for the exercise     Continue Psychosocial Services   Follow up required by staff  Follow up required by staff  No Follow up required        Psychosocial  Discharge (Final Psychosocial Re-Evaluation): Psychosocial Re-Evaluation - 08/14/18 0919      Psychosocial Re-Evaluation   Current issues with  History of Depression    Comments  Nathan Medina reports impovement in his fatigue.  Emotional support given.     Expected Outcomes  Nathan Medina will continue to manage his depression.    Interventions  Stress management education;Relaxation education;Encouraged to attend Cardiac Rehabilitation for the exercise    Continue Psychosocial Services   No Follow up required       Vocational Rehabilitation: Provide vocational rehab assistance to qualifying candidates.   Vocational Rehab Evaluation & Intervention: Vocational Rehab - 06/18/18 1522      Initial Vocational Rehab Evaluation & Intervention   Assessment shows need for Vocational Rehabilitation  No       Education: Education Goals: Education classes will be provided on a weekly basis, covering required topics. Participant will state understanding/return demonstration of topics presented.  Learning Barriers/Preferences: Learning Barriers/Preferences - 06/10/18 1050      Learning Barriers/Preferences   Learning Barriers  Hearing;Sight    Learning Preferences  Audio;Computer/Internet;Group Instruction;Individual Instruction;Pictoral;Skilled Demonstration;Verbal Instruction;Video;Written Material       Education Topics: Count Your Pulse:  -Group instruction provided by verbal instruction, demonstration, patient participation and written materials to support subject.  Instructors address importance of being able to find your pulse and how to count your pulse when at home without a heart monitor.  Patients get hands on experience counting their pulse with staff help and individually.   CARDIAC REHAB PHASE II EXERCISE from 08/01/2018 in [place]  Date  07/18/18  Educator  RN  Instruction Review Code  2- Demonstrated Understanding      Heart Attack, Angina, and Risk  Factor Modification:  -Group instruction provided by verbal instruction, video, and written materials to support subject.  Instructors address signs and symptoms of angina and heart attacks.    Also discuss risk factors for heart disease and how to make changes to improve heart health risk factors.   Functional Fitness:  -Group instruction provided by verbal instruction, demonstration, patient participation, and written materials to support subject.  Instructors address safety measures for doing things around the house.  Discuss how to get up and down off the floor, how to pick things up properly, how to safely get  out of a chair without assistance, and balance training.   Meditation and Mindfulness:  -Group instruction provided by verbal instruction, patient participation, and written materials to support subject.  Instructor addresses importance of mindfulness and meditation practice to help reduce stress and improve awareness.  Instructor also leads participants through a meditation exercise.    CARDIAC REHAB PHASE II EXERCISE from 08/01/2018 in [place]  Date  07/23/18  Educator  RN  Instruction Review Code  2- Demonstrated Understanding      Stretching for Flexibility and Mobility:  -Group instruction provided by verbal instruction, patient participation, and written materials to support subject.  Instructors lead participants through series of stretches that are designed to increase flexibility thus improving mobility.  These stretches are additional exercise for major muscle groups that are typically performed during regular warm up and cool down.   Hands Only CPR:  -Group verbal, video, and participation provides a basic overview of AHA guidelines for community CPR. Role-play of emergencies allow participants the opportunity to practice calling for help and chest compression technique with discussion of AED use.   Hypertension: -Group verbal and  written instruction that provides a basic overview of hypertension including the most recent diagnostic guidelines, risk factor reduction with self-care instructions and medication management.   CARDIAC REHAB PHASE II EXERCISE from 08/01/2018 in [place]  Date  07/26/18  Educator  RN  Instruction Review Code  2- Demonstrated Understanding       Nutrition I class: Heart Healthy Eating:  -Group instruction provided by PowerPoint slides, verbal discussion, and written materials to support subject matter. The instructor gives an explanation and review of the Therapeutic Lifestyle Changes diet recommendations, which includes a discussion on lipid goals, dietary fat, sodium, fiber, plant stanol/sterol esters, sugar, and the components of a well-balanced, healthy diet.   Nutrition II class: Lifestyle Skills:  -Group instruction provided by PowerPoint slides, verbal discussion, and written materials to support subject matter. The instructor gives an explanation and review of label reading, grocery shopping for heart health, heart healthy recipe modifications, and ways to make healthier choices when eating out.   Diabetes Question & Answer:  -Group instruction provided by PowerPoint slides, verbal discussion, and written materials to support subject matter. The instructor gives an explanation and review of diabetes co-morbidities, pre- and post-prandial blood glucose goals, pre-exercise blood glucose goals, signs, symptoms, and treatment of hypoglycemia and hyperglycemia, and foot care basics.   Diabetes Blitz:  -Group instruction provided by PowerPoint slides, verbal discussion, and written materials to support subject matter. The instructor gives an explanation and review of the physiology behind type 1 and type 2 diabetes, diabetes medications and rational behind using different medications, pre- and post-prandial blood glucose recommendations and Hemoglobin A1c  goals, diabetes diet, and exercise including blood glucose guidelines for exercising safely.    Portion Distortion:  -Group instruction provided by PowerPoint slides, verbal discussion, written materials, and food models to support subject matter. The instructor gives an explanation of serving size versus portion size, changes in portions sizes over the last 20 years, and what consists of a serving from each food group.   Stress Management:  -Group instruction provided by verbal instruction, video, and written materials to support subject matter.  Instructors review role of stress in heart disease and how to cope with stress positively.     CARDIAC REHAB PHASE II EXERCISE from 08/01/2018 in [place]  Date  06/18/18  Educator  RN  Instruction Review Code  2- Demonstrated Understanding      Exercising on Your Own:  -Group instruction provided by verbal instruction, power point, and written materials to support subject.  Instructors discuss benefits of exercise, components of exercise, frequency and intensity of exercise, and end points for exercise.  Also discuss use of nitroglycerin and activating EMS.  Review options of places to exercise outside of rehab.  Review guidelines for sex with heart disease.   CARDIAC REHAB PHASE II EXERCISE from 08/01/2018 in [place]  Date  07/16/18  Educator  EP  Instruction Review Code  2- Demonstrated Understanding      Cardiac Drugs I:  -Group instruction provided by verbal instruction and written materials to support subject.  Instructor reviews cardiac drug classes: antiplatelets, anticoagulants, beta blockers, and statins.  Instructor discusses reasons, side effects, and lifestyle considerations for each drug class.   Cardiac Drugs II:  -Group instruction provided by verbal instruction and written materials to support subject.  Instructor reviews cardiac drug classes: angiotensin  converting enzyme inhibitors (ACE-I), angiotensin II receptor blockers (ARBs), nitrates, and calcium channel blockers.  Instructor discusses reasons, side effects, and lifestyle considerations for each drug class.   CARDIAC REHAB PHASE II EXERCISE from 08/01/2018 in [place]  Date  07/02/18  Instruction Review Code  2- Demonstrated Understanding      Anatomy and Physiology of the Circulatory System:  Group verbal and written instruction and models provide basic cardiac anatomy and physiology, with the coronary electrical and arterial systems. Review of: AMI, Angina, Valve disease, Heart Failure, Peripheral Artery Disease, Cardiac Arrhythmia, Pacemakers, and the ICD.   Other Education:  -Group or individual verbal, written, or video instructions that support the educational goals of the cardiac rehab program.   Holiday Eating Survival Tips:  -Group instruction provided by PowerPoint slides, verbal discussion, and written materials to support subject matter. The instructor gives patients tips, tricks, and techniques to help them not only survive but enjoy the holidays despite the onslaught of food that accompanies the holidays.   Knowledge Questionnaire Score: Knowledge Questionnaire Score - 06/10/18 1050      Knowledge Questionnaire Score   Pre Score  19/24       Core Components/Risk Factors/Patient Goals at Admission: Personal Goals and Risk Factors at Admission - 06/10/18 1051      Core Components/Risk Factors/Patient Goals on Admission    Weight Management  Yes;Weight Maintenance;Weight Loss    Admit Weight  197 lb 12 oz (89.7 kg)    Expected Outcomes  Short Term: Continue to assess and modify interventions until short term weight is achieved;Long Term: Adherence to nutrition and physical activity/exercise program aimed toward attainment of established weight goal;Weight Maintenance: Understanding of the daily nutrition guidelines, which includes  25-35% calories from fat, 7% or less cal from saturated fats, less than 235m cholesterol, less than 1.5gm of sodium, & 5 or more servings of fruits and vegetables daily;Weight Loss: Understanding of general recommendations for a balanced deficit meal plan, which promotes 1-2 lb weight loss per week and includes a negative energy balance of [phone] kcal/d;Understanding recommendations for meals to include 15-35% energy as protein, 25-35% energy from fat, 35-60% energy from carbohydrates, less than 2015mof dietary cholesterol, 20-35 gm of total fiber daily;Understanding of distribution of calorie intake throughout the day with the consumption of 4-5 meals/snacks    Diabetes  Yes    Intervention  Provide education about signs/symptoms and action to take for hypo/hyperglycemia.;Provide education about proper nutrition, including hydration, and aerobic/resistive exercise prescription along with prescribed medications to achieve blood glucose in normal ranges: Fasting glucose 65-99 mg/dL    Expected Outcomes  Short Term: Participant verbalizes understanding of the signs/symptoms and immediate care of hyper/hypoglycemia, proper foot care and importance of medication, aerobic/resistive exercise and nutrition plan for blood glucose control.;Long Term: Attainment of HbA1C < 7%.    Hypertension  Yes    Intervention  Provide education on lifestyle modifcations including regular physical activity/exercise, weight management, moderate sodium restriction and increased consumption of fresh fruit, vegetables, and low fat dairy, alcohol moderation, and smoking cessation.;Monitor prescription use compliance.    Expected Outcomes  Short Term: Continued assessment and intervention until BP is < 140/22m HG in hypertensive participants. < 130/843mHG in hypertensive participants with diabetes, heart failure or chronic kidney disease.;Long Term: Maintenance of blood pressure at goal levels.    Lipids  Yes    Intervention   Provide education and support for participant on nutrition & aerobic/resistive exercise along with prescribed medications to achieve LDL <7076mHDL >14m68m  Expected Outcomes  Short Term: Participant states understanding of desired cholesterol values and is compliant with medications prescribed. Participant is following exercise prescription and nutrition guidelines.;Long Term: Cholesterol controlled with medications as prescribed, with individualized exercise RX and with personalized nutrition plan. Value goals: LDL < 70mg74mL > 40 mg.    Stress  Yes    Intervention  Offer individual and/or small group education and counseling on adjustment to heart disease, stress management and health-related lifestyle change. Teach and support self-help strategies.;Refer participants experiencing significant psychosocial distress to appropriate mental health specialists for further evaluation and treatment. When possible, include family members and significant others in education/counseling sessions.    Expected Outcomes  Short Term: Participant demonstrates changes in health-related behavior, relaxation and other stress management skills, ability to obtain effective social support, and compliance with psychotropic medications if prescribed.;Long Term: Emotional wellbeing is indicated by absence of clinically significant psychosocial distress or social isolation.       Core Components/Risk Factors/Patient Goals Review:  Goals and Risk Factor Review    Row Name 06/18/18 1522 07/24/18 1444 08/07/18 1029         Core Components/Risk Factors/Patient Goals Review   Personal Goals Review  Diabetes;Hypertension;Stress;Lipids;Weight Management/Obesity  Diabetes;Hypertension;Stress;Lipids;Weight Management/Obesity  Diabetes;Hypertension;Stress;Lipids;Weight Management/Obesity     Review  Pt with multiple CAD RFs willing to participate in CR exercise.  Nathan Medina like to increase his stamina   Pt with multiple CAD RFs  willing to participate in CR exercise.  Nathan Medina exercise well.   Pt continues to tolerate exercise well.  Nathan [place]with his MD and received some recommendations.  Nathan Medina that his quality of sleep has improved some.  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      Expected Outcomes  Pt will continue to participate in CR exercise, nutrition, and lifestyle modification opportunities.   Pt will continue to participate in CR exercise, nutrition, and lifestyle modification opportunities.   Pt will continue to participate in CR exercise, nutrition, and lifestyle modification opportunities.         Core Components/Risk Factors/Patient Goals at Discharge (Final Review):  Goals and Risk Factor Review - 08/07/18 1029      Core Components/Risk Factors/Patient Goals Review   Personal Goals Review  Diabetes;Hypertension;Stress;Lipids;Weight Management/Obesity    Review  Pt continues to tolerate exercise well.  Nathan Medina Medina with his MD and received some recommendations.  Nathan Medina says that his quality of sleep has improved some.  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     Expected Outcomes  Pt will continue to participate in CR exercise, nutrition, and lifestyle modification opportunities.        ITP Comments: ITP Comments    Row Name 06/10/18 0851 06/18/18 1520 07/24/18 1434 08/07/18 1027     ITP Comments  Medical Director- Dr. Fransico Him, MD  30 Day ITP Review.  Pt started exercise today and tolerated it well.  30 Day ITP Review.  Pt is tolerating exercise.  He feels that he is fatigued more than he has been.  He has not slept as well d/t tingling in his legs.   30 Day ITP Review.  Pt continues to tolerate exercise well.  Nathan Medina Medina with his MD and received some recommendations.  Nathan Medina says that his quality of  sleep has improved some.  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        Comments: See ITP Comments.

## 2018-08-14 NOTE — Telephone Encounter (Signed)
Pt called regarding extended closure of Cardiac Rehab for 4 weeks due to COVID-19, tentative reopen date of 09/08/2018.   Left Pt voicemail.  Carma Lair MS, ACSM CEP 2:06 PM 08/14/2018

## 2018-08-15 ENCOUNTER — Encounter (HOSPITAL_COMMUNITY): Payer: Medicare Other

## 2018-08-15 ENCOUNTER — Ambulatory Visit (HOSPITAL_COMMUNITY): Payer: Medicare Other

## 2018-08-16 ENCOUNTER — Other Ambulatory Visit: Payer: Self-pay | Admitting: Cardiology

## 2018-08-18 ENCOUNTER — Ambulatory Visit (HOSPITAL_COMMUNITY): Payer: Medicare Other

## 2018-08-18 ENCOUNTER — Encounter (HOSPITAL_COMMUNITY): Payer: Medicare Other

## 2018-08-20 ENCOUNTER — Encounter (HOSPITAL_COMMUNITY): Payer: Medicare Other

## 2018-08-20 ENCOUNTER — Ambulatory Visit (HOSPITAL_COMMUNITY): Payer: Medicare Other

## 2018-08-22 ENCOUNTER — Ambulatory Visit (HOSPITAL_COMMUNITY): Payer: Medicare Other

## 2018-08-22 ENCOUNTER — Encounter (HOSPITAL_COMMUNITY): Payer: Medicare Other

## 2018-08-25 ENCOUNTER — Ambulatory Visit (HOSPITAL_COMMUNITY): Payer: Medicare Other

## 2018-08-25 ENCOUNTER — Encounter (HOSPITAL_COMMUNITY): Payer: Medicare Other

## 2018-08-27 ENCOUNTER — Encounter (HOSPITAL_COMMUNITY): Payer: Medicare Other

## 2018-08-27 ENCOUNTER — Telehealth (HOSPITAL_COMMUNITY): Payer: Self-pay | Admitting: *Deleted

## 2018-08-27 ENCOUNTER — Ambulatory Visit (HOSPITAL_COMMUNITY): Payer: Medicare Other

## 2018-08-27 NOTE — Telephone Encounter (Signed)
Called to notify patient that the cardiac and pulmonary rehabilitation department remains closed at this time due to COVID-19 restrictions.Pt verbalizes understanding. Pt is walking and riding stationary bike 10-15 minutes at a time at least 3 days/week at home.   Sol Passer, MS, ACSM CEP 08/27/2018 1149

## 2018-08-29 ENCOUNTER — Other Ambulatory Visit: Payer: Self-pay | Admitting: Cardiology

## 2018-08-29 ENCOUNTER — Encounter (HOSPITAL_COMMUNITY): Payer: Medicare Other

## 2018-08-29 ENCOUNTER — Ambulatory Visit (HOSPITAL_COMMUNITY): Payer: Medicare Other

## 2018-09-01 ENCOUNTER — Ambulatory Visit (HOSPITAL_COMMUNITY): Payer: Medicare Other

## 2018-09-01 ENCOUNTER — Encounter (HOSPITAL_COMMUNITY): Payer: Medicare Other

## 2018-09-02 ENCOUNTER — Other Ambulatory Visit: Payer: Medicare Other

## 2018-09-03 ENCOUNTER — Encounter (HOSPITAL_COMMUNITY): Payer: Medicare Other

## 2018-09-03 ENCOUNTER — Ambulatory Visit (HOSPITAL_COMMUNITY): Payer: Medicare Other

## 2018-09-03 NOTE — Progress Notes (Signed)
[address]       [place] [address]             [phone]                  Nathan Medina [place] Medical Record #[id number] Date of Birth: Nov 22, 1950  Referring OZ:[id number], Thayer Jew, MD Primary Cardiology: Primary Care:Pharr, Thayer Jew, MD Telehealth Visit     Virtual Visit via Telephone Note   This visit type was conducted due to national recommendations for restrictions regarding the COVID-19 Pandemic (e.g. social distancing) in an effort to limit this patient's exposure and mitigate transmission in our community.  Due to his co-morbid illnesses, this patient is at least at moderate risk for complications without adequate follow up.  This format is felt to be most appropriate for this patient at this time.  The patient did not have access to video technology/had technical difficulties with video requiring transitioning to audio format only (telephone).  All issues noted in this document were discussed and addressed.  No physical exam could be performed with this format.  Please refer to the patient's chart for his  consent to telehealth for [facility].   This visit type was conducted due to national recommendations for restrictions regarding the COVID-19 Pandemic (e.g. social distancing).  This format is felt to be most appropriate for this patient at this time.  All issues noted in this document were discussed and addressed.  No physical exam was performed (except for noted visual exam findings with Video Visits).   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 POSTOPERATIVE DIAGNOSIS:  Coronary occlusive disease with decreased left  ventricular function and high risk stress test. SURGICAL PROCEDURE:  Coronary artery bypass grafting x3 with the left internal mammary to the left anterior descending coronary artery, reverse saphenous vein graft to the distal right coronary artery, reverse saphenous vein graft to the ramus  intermedius with right thigh greater saphenous endoscopic vein harvesting and cardiopulmonary bypass. SURGEON:  Lanelle Bal, MD  History of Present Illness:           I contacted Nathan Medina remotely due to the limitations of the current COVID pandemic on 09/04/2018  at  11:57 AM  verifying that I was speaking to Nathan Medina whose birthday is 12-16-1950.   I discussed limitations of the evaluation and management  of patients remotely without  the benefit of physical exam.  The patient was agreeable with proceeding with a remote/ not face to face visit.      Patient  Called today in follow-up after coronary artery bypass grafting November 2019 and follow up for history of groundglass opacity right upper lung.  Patient was originally seen for the incidental finding of a groundglass opacity in his right upper lobe.  This is been noticed on CT scan in March 2019, and again repeated evaluation in August.  During his evaluation he was also referred to urology and is been followed for abnormal CT of the kidney.  He then underwent cardiac catheterization was found to have significantly decreased LV function and severe three-vessel coronary artery disease and underwent coronary artery bypass grafting x3 April 01, 2018.  He has  been followed by cardiology had heart failure medications adjusted and overall says that he has much improved.  His overall stamina is better.  He denies anginal symptoms.  Does send some swelling in his leg related to the vein harvest.   He has had no symptoms of COVID infection and has been appropriately sequestering himself.    Zubrod Score: At the time of surgery this patients  most appropriate activity status/level should be described as: []     0    Normal activity, no symptoms [x]     1    Restricted in physical strenuous activity but ambulatory, able to do out light work []     2    Ambulatory and capable of self care, unable to do work activities, up and about                 >50 % of waking hours                                                                                   []     3    Only limited self care, in bed greater than 50% of waking hours []     4    Completely disabled, no self care, confined to bed or chair []     5    Moribund  Social History   Tobacco Use  Smoking Status Never Smoker  Smokeless Tobacco Never Used       Allergies  Allergen Reactions   Ciprofloxacin Rash   Escitalopram Oxalate Itching   Quinapril Hcl Rash   Sulfa Antibiotics Swelling    Swelling in the ankles    Current Outpatient Medications  Medication Sig Dispense Refill   acetaminophen (TYLENOL) 325 MG tablet Take 2 tablets (650 mg total) by mouth every 6 (six) hours as needed for mild pain (or Fever >/= 101). 20 tablet 0   allopurinol (ZYLOPRIM) 300 MG tablet TAKE 1 TABLET BY MOUTH EVERY DAY (Patient taking differently: Take 300 mg by mouth daily. ) 90 tablet 0   ALPRAZolam (XANAX) 0.5 MG tablet Take 1 tablet (0.5 mg total) by mouth daily as needed for sleep. 1/2 - 1 by mouth once daily as needed (Patient taking differently: Take 0.25-0.5 mg by mouth at bedtime as needed for sleep. ) 90 tablet 2   aspirin EC 81 MG tablet Take 81 mg by mouth daily.     aspirin-acetaminophen-caffeine (EXCEDRIN EXTRA STRENGTH) 250-250-65 MG tablet Take 1-2 tablets by mouth every 6 (six) hours as needed for headache.     atorvastatin (LIPITOR) 10 MG tablet Take 10 mg by mouth at bedtime.     calcium carbonate (TUMS) 500 MG chewable tablet Chew 2 tablets (400 mg of elemental calcium total) by mouth 2 (two) times daily. (Patient taking differently: Chew 2 tablets by mouth 2 (two)  times daily as needed for indigestion. ) 90 tablet 1   carvedilol (COREG) 3.125 MG tablet TAKE 1 TABLET BY MOUTH TWICE DAILY 60 tablet 0   Cholecalciferol (VITAMIN D-3) 5000 units TABS Take 5,000 Units by mouth at bedtime.     cyclobenzaprine (FLEXERIL) 5 MG tablet Take 5 mg by mouth 3 (three)  times daily as needed for muscle spasms.     diphenhydrAMINE (BENADRYL) 25 MG tablet Take 25 mg by mouth at bedtime as needed for sleep.     furosemide (LASIX) 40 MG tablet Take 120 mg by mouth daily.      Guaifenesin 1200 MG TB12 Take 1,200 mg by mouth 2 (two) times daily as needed (cough).     insulin glargine (LANTUS) 100 unit/mL SOPN Inject 40 Units into the skin at bedtime.      levothyroxine (SYNTHROID, LEVOTHROID) 175 MCG tablet Take 200 mcg by mouth daily before breakfast.      Multiple Vitamins-Minerals (PRESERVISION AREDS 2 PO) Take 1 tablet by mouth 2 (two) times daily.     omega-3 acid ethyl esters (LOVAZA) 1 g capsule Take 2 g by mouth 2 (two) times daily.      omeprazole (PRILOSEC) 20 MG capsule Take 20 mg by mouth daily before breakfast.      sacubitril-valsartan (ENTRESTO) 49-51 MG Take 1 tablet by mouth 2 (two) times daily. 60 tablet 3   sildenafil (REVATIO) 20 MG tablet Take 100 mg by mouth daily as needed.      spironolactone (ALDACTONE) 25 MG tablet Take 1 tablet (25 mg total) by mouth daily. 90 tablet 3   traMADol (ULTRAM) 50 MG tablet Take 1-2 tablets (50-100 mg total) by mouth every 6 (six) hours as needed for moderate pain. 24 tablet 0   triamcinolone cream (KENALOG) 0.1 % Apply 1 application topically 2 (two) times daily as needed (skin irritation).     vitamin E (VITAMIN E) 1000 UNIT capsule Take 1,000 Units by mouth at bedtime.     XARELTO 2.5 MG TABS tablet Take 1 tablet by mouth twice daily 60 tablet 0   No current facility-administered medications for this visit.        Physical Exam: There were no vitals taken for this visit.  Patient describes no  problems with his incisions, no sternal popping or clicking is appreciated by the patient  Diagnostic Studies & Laboratory data:         Recent Radiology Findings: Ct Chest Wo Contrast  Result Date: 09/04/2018 CLINICAL DATA:  Follow-up lung nodule EXAM: CT CHEST WITHOUT CONTRAST TECHNIQUE: Multidetector CT imaging of the chest was performed following the standard protocol without IV contrast. COMPARISON:  PET-CT, 01/01/2018, 08/26/2017, CT chest, 08/06/2017, CT abdomen pelvis, 06/11/2018 FINDINGS: Cardiovascular: Three-vessel coronary artery calcifications. Normal heart size. No pericardial effusion. Mediastinum/Nodes: No enlarged mediastinal, hilar, or axillary lymph nodes. Thyroid gland, trachea, and esophagus demonstrate no significant findings. Lungs/Pleura: No change in a ground-glass nodule of the posterior right upper lobe measuring approximately 1.5 cm (series 3, image 31). No pleural effusion or pneumothorax. Upper Abdomen: No acute abnormality. Partially imaged mass of the left kidney (series 2, image 178), previously characterized as suspicious for malignancy by CT. Gallstones. Cystic lesion of the pancreatic tail measuring 2.9 cm (series 2, image 24). Musculoskeletal: No chest wall mass or suspicious bone lesions identified. IMPRESSION: 1. No change in a ground-glass nodule of the posterior right upper lobe measuring approximately 1.5 cm (series 3, image 31). 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 Recommend additional follow-up in 1 year and annually through 5 years of stability. 2. Partially imaged mass of the left kidney (series 2, image 178), previously characterized as suspicious for malignancy by CT. Cystic lesion of the pancreatic tail measuring 2.9 cm (series 2, image 24). 3.  Coronary artery disease. Electronically Signed  By: Eddie Candle M.D.   On: 09/04/2018 10:40      I have independently reviewed the above radiology  findings and reviewed findings  with the patient.  Recent Labs: Lab Results  Component Value Date   WBC 6.0 04/08/2018   HGB 10.7 (L) 04/08/2018   HCT 34.8 (L) 04/08/2018   PLT 156 04/08/2018   GLUCOSE 116 (H) 07/01/2018   CHOL 112 07/01/2018   TRIG 134 07/01/2018   HDL 32 (L) 07/01/2018   LDLDIRECT 84.5 04/01/2012   LDLCALC 53 07/01/2018   ALT 30 04/05/2018   AST 24 04/05/2018   NA 140 07/01/2018   K 4.4 07/01/2018   CL 102 07/01/2018   CREATININE 1.60 (H) 07/01/2018   BUN 42 (H) 07/01/2018   CO2 23 07/01/2018   TSH 2.28 04/01/2012   INR 1.55 04/01/2018   HGBA1C 6.2 (H) 02/14/2018      Assessment / Plan:   #1 status post coronary artery bypass grafting, with known underlying depressed LV function preoperative echocardiogram showed 45 to 45% ejection fraction, needs continued monitoring of his diuretic therapy-patient has a follow-up echocardiogram arranged by Dr. Virgina Jock #2 history of groundglass opacity right upper lung- No change in a ground-glass nodule of the posterior right upper lobe measuring approximately 1.5 cm (series 3, image 31). 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 Recommend additional follow-up in 1 year and annually through 5 years of stability.  I reviewed these findings with the patient and recommended that between 10 and 12 months we obtain a follow-up CT scan of the chest.  He is agreeable with this approach. #3 patient notes that he continues to have follow-up through urology for   2.6 cm enhancing lesion at lateral left lower kidney  I spent in excess of  14   minutes of direct contact time with the patient during the conduct of this telephone.video  virtual office consultation.  Level 1  (99441)             5-10 minutes Level 2  (99442)            11-20 minutes Level 3  (99443)            21-30 minutes  Grace Isaac 09/04/2018 11:57 AM

## 2018-09-04 ENCOUNTER — Other Ambulatory Visit: Payer: Medicare Other

## 2018-09-04 ENCOUNTER — Other Ambulatory Visit: Payer: Self-pay

## 2018-09-04 ENCOUNTER — Telehealth (INDEPENDENT_AMBULATORY_CARE_PROVIDER_SITE_OTHER): Payer: Medicare Other | Admitting: Cardiothoracic Surgery

## 2018-09-04 ENCOUNTER — Ambulatory Visit
Admission: RE | Admit: 2018-09-04 | Discharge: 2018-09-04 | Disposition: A | Discharge status: Home / Self Care | Payer: Medicare Other | Source: Ambulatory Visit | Attending: Cardiothoracic Surgery | Admitting: Cardiothoracic Surgery

## 2018-09-04 DIAGNOSIS — R918 Other nonspecific abnormal finding of lung field: Secondary | ICD-10-CM

## 2018-09-04 DIAGNOSIS — I2510 Atherosclerotic heart disease of native coronary artery without angina pectoris: Secondary | ICD-10-CM

## 2018-09-04 NOTE — Progress Notes (Signed)
F/u on right upper lung with Chest CT, HX of CABG. Reviewed all medications.  Mr Doell would prefer a telephone call from Dr Servando Snare to review Chest CT.

## 2018-09-05 ENCOUNTER — Ambulatory Visit (HOSPITAL_COMMUNITY): Payer: Medicare Other

## 2018-09-05 ENCOUNTER — Encounter (HOSPITAL_COMMUNITY): Payer: Medicare Other

## 2018-09-08 ENCOUNTER — Ambulatory Visit (HOSPITAL_COMMUNITY): Payer: Medicare Other

## 2018-09-08 ENCOUNTER — Encounter (HOSPITAL_COMMUNITY): Payer: Medicare Other

## 2018-09-10 ENCOUNTER — Encounter (HOSPITAL_COMMUNITY): Payer: Medicare Other

## 2018-09-10 ENCOUNTER — Ambulatory Visit (HOSPITAL_COMMUNITY): Payer: Medicare Other

## 2018-09-12 ENCOUNTER — Ambulatory Visit (HOSPITAL_COMMUNITY): Payer: Medicare Other

## 2018-09-12 ENCOUNTER — Encounter (HOSPITAL_COMMUNITY): Payer: Medicare Other

## 2018-09-15 ENCOUNTER — Encounter (HOSPITAL_COMMUNITY): Payer: Medicare Other

## 2018-09-15 ENCOUNTER — Ambulatory Visit (HOSPITAL_COMMUNITY): Payer: Medicare Other

## 2018-09-17 ENCOUNTER — Encounter (HOSPITAL_COMMUNITY): Payer: Medicare Other

## 2018-09-17 ENCOUNTER — Ambulatory Visit (HOSPITAL_COMMUNITY): Payer: Medicare Other

## 2018-09-19 ENCOUNTER — Ambulatory Visit (HOSPITAL_COMMUNITY): Payer: Medicare Other

## 2018-09-19 ENCOUNTER — Encounter (HOSPITAL_COMMUNITY): Payer: Medicare Other

## 2018-10-03 ENCOUNTER — Other Ambulatory Visit: Payer: Self-pay | Admitting: Cardiology

## 2018-10-03 MED ORDER — CARVEDILOL 6.25 MG PO TABS: 6.2500 mg | ORAL_TABLET | Freq: Two times a day (BID) | ORAL | 3 refills | Status: DC

## 2018-10-10 LAB — BASIC METABOLIC PANEL
BUN/Creatinine Ratio: 22 (ref 10–24)
BUN: 35 mg/dL — ABNORMAL HIGH (ref 8–27)
CO2: 23 mmol/L (ref 20–29)
Calcium: 9.5 mg/dL (ref 8.6–10.2)
Chloride: 107 mmol/L — ABNORMAL HIGH (ref 96–106)
Creatinine, Ser: 1.59 mg/dL — ABNORMAL HIGH (ref 0.76–1.27)
GFR calc Af Amer: 51 mL/min/{1.73_m2} — ABNORMAL LOW (ref >59)
GFR calc non Af Amer: 44 mL/min/{1.73_m2} — ABNORMAL LOW (ref >59)
Glucose: 132 mg/dL — ABNORMAL HIGH (ref 65–99)
Potassium: 4.3 mmol/L (ref 3.5–5.2)
Sodium: 142 mmol/L (ref 134–144)

## 2018-10-10 NOTE — Progress Notes (Signed)
Phone number is not working. 

## 2018-10-16 ENCOUNTER — Telehealth (HOSPITAL_COMMUNITY): Payer: Self-pay

## 2018-10-16 ENCOUNTER — Telehealth (HOSPITAL_COMMUNITY): Payer: Self-pay | Admitting: *Deleted

## 2018-10-16 NOTE — Telephone Encounter (Signed)
Please contact letter I regards to virtual cardiac rehab sent to address on file in epic. Await response, if no response by June 12th will discharge pt from the program.  Maurice Small RN, BSN Cardiac and Pulmonary Rehab Nurse Navigator

## 2018-10-16 NOTE — Telephone Encounter (Signed)
Phone call made to Pt to provide information about virtual cardiac rehab. Pt did not answer. Message was left for Pt to return call.  

## 2018-10-23 ENCOUNTER — Other Ambulatory Visit: Payer: Self-pay | Admitting: Cardiology

## 2018-10-23 ENCOUNTER — Ambulatory Visit: Payer: Medicare Other | Admitting: Cardiology

## 2018-10-24 NOTE — Telephone Encounter (Signed)
Please fill

## 2018-10-27 ENCOUNTER — Ambulatory Visit (INDEPENDENT_AMBULATORY_CARE_PROVIDER_SITE_OTHER): Payer: Medicare Other

## 2018-10-27 ENCOUNTER — Encounter: Payer: Self-pay | Admitting: Cardiology

## 2018-10-27 ENCOUNTER — Other Ambulatory Visit: Payer: Self-pay

## 2018-10-27 ENCOUNTER — Ambulatory Visit: Payer: Medicare Other | Admitting: Cardiology

## 2018-10-27 VITALS — BP 116/75 | HR 54 | Temp 98.0°F | Ht 67.0 in | Wt 199.1 lb

## 2018-10-27 DIAGNOSIS — I2510 Atherosclerotic heart disease of native coronary artery without angina pectoris: Secondary | ICD-10-CM | POA: Diagnosis not present

## 2018-10-27 DIAGNOSIS — I10 Essential (primary) hypertension: Secondary | ICD-10-CM | POA: Diagnosis not present

## 2018-10-27 DIAGNOSIS — Z951 Presence of aortocoronary bypass graft: Secondary | ICD-10-CM

## 2018-10-27 DIAGNOSIS — I255 Ischemic cardiomyopathy: Secondary | ICD-10-CM | POA: Diagnosis not present

## 2018-10-27 MED ORDER — RIVAROXABAN 2.5 MG PO TABS: 2.5000 mg | ORAL_TABLET | Freq: Two times a day (BID) | ORAL | 3 refills | Status: DC

## 2018-10-27 NOTE — Progress Notes (Addendum)
Patient is here for follow up visit.  Subjective:   @Patient  ID: Nathan Medina, male    DOB: 01-27-1951, 68 y.o.   MRN: [id number]  Chief Complaint  Patient presents with  . Coronary Artery Disease  . Congestive Heart Failure  . Hypertension  . Follow-up    47molabs, echo results   68year old Caucasian male with hypertension, CKD stage III, and 2 diabetes mellitus, ischemic cardiomyopathy EF 35-40%, coronary artery disease s/p CABGX3 (LIMA-LAD, SVG-dRCA, SVG-ramus) by Dr. GServando Snareon 04/01/2018, stable renal and lung nodules followed by specialists, s/p thyroidectomy.  At last visit, I continued his Entresto to 49-51 mg bid, continued coreg 3.125 mg bid, spironolactone to 25 mg daily, lasix 40/80 mg alternate day. Underwent echocardiogram today. He is here for follow up for 3 months.    States that his dizziness is improved and fatigue is also improved and he has started to get back to his routine activity.  Leg edema has essentially resolved with furosemide.  He has not had any chest pain, dizziness or syncope.  Past Medical History:  Diagnosis Date  . Abnormal liver function test 05/23/2011  . ALLERGIC RHINITIS   . ANXIETY   . Aortic atherosclerosis ([facility]   . Arthritis   . Ascending aorta dilation ([facility]) 07/24/2018  . Bilateral renal cysts   . Cervical spondylolysis    Moderate  . CHF (congestive heart failure) ([facility] 07/24/2018  . Chronic kidney disease    CKD stage 3 per office visit note 08/08/17 on chart   . COLONIC POLYPS, HX OF   . Coronary artery disease   . DEPRESSION   . DIABETES MELLITUS, TYPE II   . Diverticulitis   . Epidermal cyst   . Fatty liver   . GERD   . GOUT   . History of inguinal hernia   . HOH (hard of hearing)    elft ear  . HYPERLIPIDEMIA   . HYPERTENSION   . IBS   . 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 Ischemic cardiomyopathy 07/24/2018  . Left renal  mass 05/23/2011  . Lumbar radicular pain 06/03/2011  . Macular degeneration    right eye  . Multinodular goiter   . Pancreatic pseudocyst    Stable  . Peritonsillar abscess   . Pulmonary nodule    Right upper lobe  . Radial neck fracture   . Right upper lobe pulmonary nodule 07/24/2018  . Shortness of breath    occasional shortness of breath  with exertion  . Thyroid disease   . Thyroid nodule    Bilateral  . Vertigo     Past Surgical History:  Procedure Laterality Date  . APPENDECTOMY  02/12/11  . COLON SURGERY  08/11/10   sigmoid colectomy  . COLON SURGERY  02/12/11   colostomy takedown  . COLONOSCOPY    . CORONARY ARTERY BYPASS GRAFT N/A 04/01/2018   Procedure: CORONARY ARTERY BYPASS GRAFTING (CABG) x Three, using left internal mammary artery and right leg greater saphenous vein harvested endoscopically;  Surgeon: GGrace Isaac MD;  [facility] [facility]  Service: Open Heart Surgery;  Laterality: N/A;  . CYST REMOVAL TRUNK Right 02/20/2018   Procedure: epidermal cyst excision right lower back;  Surgeon: GArmandina Gemma MD;  Location: [facility];  Service: General;  Laterality: Right;  . HERNIA REPAIR    . INSERTION OF MESH N/A 08/20/2012   Procedure: INSERTION OF MESH;  Surgeon: Luella Cook III, MD;  Location: [facility];  Service: General;  Laterality: N/A;  . LEFT HEART CATH AND CORONARY ANGIOGRAPHY N/A 01/14/2018   Procedure: LEFT HEART CATH AND CORONARY ANGIOGRAPHY;  Surgeon: [name], MD;  Location: [facility];  Service: Cardiovascular;  Laterality: N/A;  . LYSIS OF ADHESION  08/20/2012   Procedure: LYSIS OF ADHESION;  Surgeon: [name], MD;  Location: [facility];  Service: General;;  . SEPTOPLASTY  age 2  . TEE WITHOUT CARDIOVERSION N/A 04/01/2018   Procedure: TRANSESOPHAGEAL ECHOCARDIOGRAM (TEE);  Surgeon: Grace Isaac, MD;  Location: [place];  Service: Open Heart Surgery;  Laterality: N/A;  . THYROIDECTOMY N/A 10/17/2017   Procedure: TOTAL THYROIDECTOMY;   Surgeon: [name], MD;  Location: [facility];  Service: General;  Laterality: N/A;  . TONSILLECTOMY Right 08/01/2016   Procedure: INCISION AND DRAINAGE RIGHT PERI-TONSILLAR ABSCESS;  Surgeon: [name], MD;  Location: [facility];  Service: ENT;  Laterality: Right;  . ULTRASOUND GUIDANCE FOR VASCULAR ACCESS  01/14/2018   Procedure: Ultrasound Guidance For Vascular Access;  Surgeon: [name], MD;  Location: [facility];  Service: Cardiovascular;;  . VENTRAL HERNIA REPAIR  08/20/2012   Procedure: HERNIA REPAIR VENTRAL ADULT;  Surgeon: [name], MD;  Location: [facility];  Service: General;;    Social History   Socioeconomic History  . Marital status: Single    Spouse name: Not on file  . Number of children: 0  . Years of education: Not on file  . Highest education level: Not on file  Occupational History  . Not on file  Social Needs  . Financial resource strain: Not on file  . Food insecurity:    Worry: Not on file    Inability: Not on file  . Transportation needs:    Medical: Not on file    Non-medical: Not on file  Tobacco Use  . Smoking status: Never Smoker  . Smokeless tobacco: Never Used  Substance and Sexual Activity  . Alcohol use: Yes    Comment: occ  . Drug use: No  . Sexual activity: Not on file  Lifestyle  . Physical activity:    Days per week: Not on file    Minutes per session: Not on file  . Stress: Not on file  Relationships  . Social connections:    Talks on phone: Not on file    Gets together: Not on file    Attends religious service: Not on file    Active member of club or organization: Not on file    Attends meetings of clubs or organizations: Not on file    Relationship status: Not on file  . Intimate partner violence:    Fear of current or ex partner: Not on file    Emotionally abused: Not on file    Physically abused: Not on file    Forced sexual activity: Not on file  Other Topics Concern  . Not on file  Social History  Narrative  . Not on file    Current Outpatient Medications on File Prior to Visit  Medication Sig Dispense Refill  . acetaminophen (TYLENOL) 325 MG tablet Take 2 tablets (650 mg total) by mouth every 6 (six) hours as needed for mild pain (or Fever >/= 101). 20 tablet 0  . allopurinol (ZYLOPRIM) 300 MG tablet TAKE 1 TABLET BY MOUTH EVERY DAY (Patient taking differently: Take 300 mg by mouth daily. ) 90  tablet 0  . ALPRAZolam (XANAX) 0.5 MG tablet Take 1 tablet (0.5 mg total) by mouth daily as needed for sleep. 1/2 - 1 by mouth once daily as needed (Patient taking differently: Take 0.25-0.5 mg by mouth at bedtime as needed for sleep. ) 90 tablet 2  . aspirin EC 81 MG tablet Take 81 mg by mouth daily.    [name] atorvastatin (LIPITOR) 10 MG tablet Take 10 mg by mouth at bedtime.    . calcium carbonate (TUMS) 500 MG chewable tablet Chew 2 tablets (400 mg of elemental calcium total) by mouth 2 (two) times daily. (Patient taking differently: Chew 2 tablets by mouth 2 (two) times daily as needed for indigestion. ) 90 tablet 1  . carvedilol (COREG) 6.25 MG tablet Take 1 tablet (6.25 mg total) by mouth 2 (two) times daily. 60 tablet 3  . Cholecalciferol (VITAMIN D-3) 5000 units TABS Take 5,000 Units by mouth at bedtime.    . diphenhydrAMINE (BENADRYL) 25 MG tablet Take 25 mg by mouth at bedtime as needed for sleep.    . furosemide (LASIX) 40 MG tablet Take by mouth. 2 tabs qod rotating 1 tab qod    . Guaifenesin 1200 MG TB12 Take 1,200 mg by mouth 2 (two) times daily as needed (cough).    . insulin glargine (LANTUS) 100 unit/mL SOPN Inject 44 Units into the skin at bedtime.     [name] levothyroxine (SYNTHROID) 200 MCG tablet Take 200 mcg by mouth daily before breakfast.     . Multiple Vitamins-Minerals (PRESERVISION AREDS 2 PO) Take 1 tablet by mouth 2 (two) times daily.    [name] omega-3 acid ethyl esters (LOVAZA) 1 g capsule Take 2 g by mouth 2 (two) times daily.     [name] omeprazole (PRILOSEC) 20 MG capsule Take 20 mg by  mouth daily before breakfast.     . sacubitril-valsartan (ENTRESTO) 49-51 MG Take 1 tablet by mouth 2 (two) times daily. 60 tablet 3  . sildenafil (REVATIO) 20 MG tablet Take 100 mg by mouth daily as needed.     . traMADol (ULTRAM) 50 MG tablet Take 1-2 tablets (50-100 mg total) by mouth every 6 (six) hours as needed for moderate pain. 24 tablet 0  . triamcinolone cream (KENALOG) 0.1 % Apply 1 application topically 2 (two) times daily as needed (skin irritation).    Alveda Reasons 2.5 MG TABS tablet Take 1 tablet by mouth twice daily 60 tablet 0  . aspirin-acetaminophen-caffeine (EXCEDRIN EXTRA STRENGTH) 250-250-65 MG tablet Take 1-2 tablets by mouth every 6 (six) hours as needed for headache.     No current facility-administered medications on file prior to visit.     Cardiovascular studies:  Echocardiogram 10/27/2018:  Poor echo window. Wall motion abnormality has reduced sensitivity. Left ventricle cavity is normal in size. Mild concentric hypertrophy of the left ventricle. Doppler evidence of grade I (impaired) diastolic dysfunction, normal LAP. Mildly depressed LV systolic function with visual EF 45-50%. Abnormal septal wall motion due to post-operative coronary artery bypass graft. I cannot exclude Mid anterolateral and Apical lateral hypokinesis. Calculated EF 49%. Mild (Grade I) mitral regurgitation. The aortic root is mildly dilated at 3.8 cm. Compared to the study done on 04/24/2018, EF appears to have improved from 35 to 40%.  Post Bypass TEE 04/01/2018: Tricuspid, Pulmonic, Mitral and Aortic valve unchanged. Anterior and anteroseptal wall motion improved, LVEF > 55% (CO > 6) with vasopressor support. No dissection noted after cannula removed.    Cath 01/14/2018: LM: Normal  LAD: Ostial 100% occluded. Distal and apical LAD 90% stenoses          Grade 2 right-to-left collaterals from RPLA Ramus: Large vessel with tandem 80-90% proximal and mid 60% stenosis LCx: Mild prox disease RCA:  Prox 40% mid focal 70% stenoses. RPDA distally occluded. Good surgical revascularization targets seen. LVEDP: Normal Recommendation: Diabetic patient with reduced LVEF and high Syntax score and moderate LAD territory ischemia superimposed on infarct. Recommend CVTS evaluation for CABG  Arterial duplex 11/19/2017: Normal examination. No evidence of hemodynamically significant peripheral arterial disease.  CT chest 08/06/2017: - persistent right upper lobe nodule with increase in size compared to 2018 PET scan 08/26/2017: - very low metabolic activity in the right upper lobe nodule. SUV 1.3. No other significant uptake  Labs: Labs 06/13/2018: Glucose 248. BUN/Cr 32/1.32. eGFR 55. Na/K 141/4.6  Labs 05/12/2018: Glucose 202. BUN/Cr 43/1.65. eGFR 42. Na/K 142/4.7  Labs 04/22/2018: Glucose 162. BUN/Cr 24/1.43. eGFR 50. Na/K 140/4.3  Labs 01/08/2018: Glucose 138. BUN/Cr 30/1.55. EGFR 53. Na/K 143/3.9 H/H 15/46. MCV 94. Platelets 144.  Labs 11/26/2017: RBC 5.43, Hct 49.3, Plt 121, CBC otherwise normal.  Labs 10/18/2017: Potassium 4.4, creatinine 1.79, EGFR 38, BMP otherwise normal.  Review of Systems  Constitution: Positive for malaise/fatigue. Negative for decreased appetite, weight gain and weight loss.  HENT: Negative for congestion.   Eyes: Negative for visual disturbance.  Cardiovascular: Negative for chest pain, claudication, dyspnea on exertion, leg swelling, palpitations and syncope.  Respiratory: Negative for shortness of breath.   Endocrine: Negative for cold intolerance.  Hematologic/Lymphatic: Does not bruise/bleed easily.  Skin: Negative for itching and rash.  Musculoskeletal: Negative for myalgias.  Gastrointestinal: Negative for abdominal pain, nausea and vomiting.  Genitourinary: Negative for dysuria.  Neurological: Positive for paresthesias (Bilateral legs). Negative for dizziness and weakness.  Psychiatric/Behavioral: The patient is not nervous/anxious.   All other  systems reviewed and are negative.     Objective:   Vitals:   10/27/18 1229  BP: 116/75  Pulse: (!) 54  Temp: 98 F (36.7 C)  SpO2: 98%    Physical Exam  Constitutional: He is oriented to person, place, and time. He appears well-developed and well-nourished. No distress.  HENT:  Head: Normocephalic and atraumatic.  Eyes: Pupils are equal, round, and reactive to light. Conjunctivae are normal.  Neck: No JVD present.  Cardiovascular: Regular rhythm and intact distal pulses. Bradycardia present.  Pulmonary/Chest: Effort normal and breath sounds normal. He has no wheezes. He has no rales.  Sternotomy scar   Abdominal: Soft. Bowel sounds are normal. There is no rebound.  Musculoskeletal:        General: No edema.  Lymphadenopathy:    He has no cervical adenopathy.  Neurological: He is alert and oriented to person, place, and time. No cranial nerve deficit.  Skin: Skin is warm and dry.  Psychiatric: He has a normal mood and affect.  Nursing note and vitals reviewed.     Assessment & Recommendations:    68 year old Caucasian male with hypertension, CKD stage III, and 2 diabetes mellitus, ischemic cardiomyopathy EF 35-40%, coronary artery disease s/p CABGX3 (LIMA-LAD, SVG-dRCA, SVG-ramus) by Dr. Servando Snare on 04/01/2018, stable renal and lung nodules followed by specialists, s/p thyroidectomy.  Coronary artery disease involving native coronary artery of native heart without angina pectoris - s/p CABGX3 (LIMA-LAD, SVG-dRCA, SVG-ramus) by Dr. Servando Snare on 04/01/2018, - Plan: EKG 12-Lead  Hx of CABG - CABGX3 (LIMA-LAD, SVG-dRCA, SVG-ramus) by Dr. Servando Snare on 04/01/2018,  Ischemic cardiomyopathy  Essential  hypertension  EKG 10/27/2018: Sinus bradycardia at rate of 52 beats minute, left atrial abnormality, inferior infarct old, anterolateral infarct old.  Nonspecific T abnormality, cannot exclude anterolateral ischemia. No significant change from  EKG 04/14/2018.  Recommendation:   Patient is not in any acute decompensated heart failure.  His echocardiogram reveals very mildly reduced LVEF around 45% done today.  Advised the patient to take furosemide as little as possible, is presently taking 2 tablets and one tablet every other day, advised him to take one tablet every day for a few days and then see whether he could take it one every other day.  He is presently on Xarelto 2.5 mg daily for CAD along with aspirin 81 mg daily.  His aortic root measures 3.8 cm on the echocardiogram today. 4.1 cm on CT without contrast in 07/2017.  He had repeat CT scan for 16 2020 without contrast hence not evaluated again.  Irrespective of this, the aortic root diameter is only mildly enlarged, Hence should not matter as we're able to on echocardiogram,  Can be monitored on annual basis.  Blood pressure is stable, fatigue is improved, I have encouraged him to walk on a daily basis.  No changes in the medications were done today. He will see Dr. Wonda Amis back in 3 months with repeat BMP.   Adrian Prows, MD, [facility] 10/27/2018, 1:24 PM [facility]. [place] Pager: [phone] Office: [phone] If no answer Cell [phone]

## 2018-11-06 ENCOUNTER — Other Ambulatory Visit: Payer: Self-pay | Admitting: Cardiology

## 2018-11-06 ENCOUNTER — Ambulatory Visit: Payer: Medicare Other | Admitting: Cardiology

## 2018-11-07 ENCOUNTER — Other Ambulatory Visit: Payer: Self-pay

## 2018-11-07 NOTE — Telephone Encounter (Signed)
Please fill if its ok

## 2018-11-10 NOTE — Progress Notes (Signed)
Discharge Progress Report  Patient Details  Name: Nathan Medina MRN: [id number] Date of Birth: [birth year] Referring Provider:     Jansen from 06/10/2018 in [place]  Referring Provider  Dr. 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 Virgina Jock    Expected Discharge Date  09/15/18      NuStep   Level  2    SPM  75    Minutes  10    METs  2      Arm Ergometer   Level  1    Watts  30    Minutes  10    METs  2.75      Track   Laps  7    Minutes  10    METs  2.23      Prescription Details   Frequency (times per week)  3    Duration  Progress to 30 minutes of continuous aerobic without signs/symptoms of physical distress      Intensity   THRR 40-80% of Max Heartrate  61-122    Ratings of Perceived Exertion  11-13      Progression   Progression  Continue to progress workloads to maintain intensity without signs/symptoms of physical distress.      Resistance Training   Training Prescription  Yes    Weight  2 lbs.     Reps  10-15       Discharge Exercise Prescription (Final Exercise Prescription Changes): Exercise Prescription Changes - 08/01/18 1434      Response to Exercise   Blood Pressure (Admit)  116/70    Blood Pressure (Exercise)  122/64    Blood Pressure (Exit)  104/68    Heart Rate (Admit)  85 bpm    Heart Rate (Exercise)  92 bpm    Heart Rate (Exit)  73 bpm    Rating of Perceived Exertion (Exercise)  11    Perceived Dyspnea (Exercise)  0    Symptoms  None    Comments   None    Duration  Progress to 30 minutes of  aerobic without signs/symptoms of physical distress    Intensity  THRR unchanged      Progression   Progression  Continue to progress workloads to maintain intensity without signs/symptoms of physical distress.    Average METs  2.83      Resistance Training   Training Prescription  Yes    Weight  5lbs    Reps  10-15    Time  10 Minutes      Interval Training   Interval Training  No      NuStep   Level  3    SPM  95    Minutes  10    METs  2.8      Arm Ergometer   Level  1    Watts  30    Minutes  10    METs  2.78  Track   Laps  11    Minutes  10    METs  2.91      Home Exercise Plan   Plans to continue exercise at  Home (comment)   Walking   Frequency  Add 4 additional days to program exercise sessions.    Initial Home Exercises Provided  07/07/18       Functional Capacity: 6 Minute Walk    Row Name 06/10/18 1037         6 Minute Walk   Phase  Initial     Distance  1157 feet     Walk Time  6 minutes     # of Rest Breaks  0     MPH  2.19     METS  2.07     RPE  12     VO2 Peak  7.25     Symptoms  No     Resting HR  72 bpm     Resting BP  108/58     Resting Oxygen Saturation   96 %     Exercise Oxygen Saturation  during 6 min walk  97 %     Max Ex. HR  64 bpm     Max Ex. BP  102/54     2 Minute Post BP  102/78        Psychological, QOL, Others - Outcomes: PHQ 2/9: Depression screen PHQ 2/9 06/18/2018  Decreased Interest 0  Down, Depressed, Hopeless 1  PHQ - 2 Score 1  Some recent data might be hidden    Quality of Life: Quality of Life - 06/10/18 1103      Quality of Life   Select  Quality of Life      Quality of Life Scores   Health/Function Pre  20.7 %    Socioeconomic Pre  26.8 %    Psych/Spiritual Pre  23.7 %    Family Pre  30 %    GLOBAL Pre  24 %       Personal Goals: Goals established at orientation with interventions provided to work toward goal. Personal Goals and Risk  Factors at Admission - 06/10/18 1051      Core Components/Risk Factors/Patient Goals on Admission    Weight Management  Yes;Weight Maintenance;Weight Loss    Admit Weight  197 lb 12 oz (89.7 kg)    Expected Outcomes  Short Term: Continue to assess and modify interventions until short term weight is achieved;Long Term: Adherence to nutrition and physical activity/exercise program aimed toward attainment of established weight goal;Weight Maintenance: Understanding of the daily nutrition guidelines, which includes 25-35% calories from fat, 7% or less cal from saturated fats, less than 219m cholesterol, less than 1.5gm of sodium, & 5 or more servings of fruits and vegetables daily;Weight Loss: Understanding of general recommendations for a balanced deficit meal plan, which promotes 1-2 lb weight loss per week and includes a negative energy balance of [phone] kcal/d;Understanding recommendations for meals to include 15-35% energy as protein, 25-35% energy from fat, 35-60% energy from carbohydrates, less than 209mof dietary cholesterol, 20-35 gm of total fiber daily;Understanding of distribution of calorie intake throughout the day with the consumption of 4-5 meals/snacks    Diabetes  Yes    Intervention  Provide education about signs/symptoms and action to take for hypo/hyperglycemia.;Provide education about proper nutrition, including hydration, and aerobic/resistive exercise prescription along with prescribed medications to achieve blood glucose in normal ranges: Fasting glucose 65-99 mg/dL  Expected Outcomes  Short Term: Participant verbalizes understanding of the signs/symptoms and immediate care of hyper/hypoglycemia, proper foot care and importance of medication, aerobic/resistive exercise and nutrition plan for blood glucose control.;Long Term: Attainment of HbA1C < 7%.    Hypertension  Yes    Intervention  Provide education on lifestyle modifcations including regular physical activity/exercise,  weight management, moderate sodium restriction and increased consumption of fresh fruit, vegetables, and low fat dairy, alcohol moderation, and smoking cessation.;Monitor prescription use compliance.    Expected Outcomes  Short Term: Continued assessment and intervention until BP is < 140/110m HG in hypertensive participants. < 130/846mHG in hypertensive participants with diabetes, heart failure or chronic kidney disease.;Long Term: Maintenance of blood pressure at goal levels.    Lipids  Yes    Intervention  Provide education and support for participant on nutrition & aerobic/resistive exercise along with prescribed medications to achieve LDL <7059mHDL >57m36m  Expected Outcomes  Short Term: Participant states understanding of desired cholesterol values and is compliant with medications prescribed. Participant is following exercise prescription and nutrition guidelines.;Long Term: Cholesterol controlled with medications as prescribed, with individualized exercise RX and with personalized nutrition plan. Value goals: LDL < 70mg45mL > 40 mg.    Stress  Yes    Intervention  Offer individual and/or small group education and counseling on adjustment to heart disease, stress management and health-related lifestyle change. Teach and support self-help strategies.;Refer participants experiencing significant psychosocial distress to appropriate mental health specialists for further evaluation and treatment. When possible, include family members and significant others in education/counseling sessions.    Expected Outcomes  Short Term: Participant demonstrates changes in health-related behavior, relaxation and other stress management skills, ability to obtain effective social support, and compliance with psychotropic medications if prescribed.;Long Term: Emotional wellbeing is indicated by absence of clinically significant psychosocial distress or social isolation.        Personal Goals Discharge: Goals and  Risk Factor Review    Row Name 06/18/18 1522 07/24/18 1444 08/07/18 1029         Core Components/Risk Factors/Patient Goals Review   Personal Goals Review  Diabetes;Hypertension;Stress;Lipids;Weight Management/Obesity  Diabetes;Hypertension;Stress;Lipids;Weight Management/Obesity  Diabetes;Hypertension;Stress;Lipids;Weight Management/Obesity     Review  Pt with multiple CAD RFs willing to participate in CR exercise.  RoberJaquild like to increase his stamina   Pt with multiple CAD RFs willing to participate in CR exercise.  RoberKonstantinolerating exercise well.   Pt continues to tolerate exercise well.  RoberHerbie [place] his MD and received some recommendations.  RoberExodus that his quality of sleep has improved some.  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      Expected Outcomes  Pt will continue to participate in CR exercise, nutrition, and lifestyle modification opportunities.   Pt will continue to participate in CR exercise, nutrition, and lifestyle modification opportunities.   Pt will continue to participate in CR exercise, nutrition, and lifestyle modification opportunities.         Exercise Goals and Review: Exercise Goals    Row Name 06/10/18 0900             Exercise Goals   Increase Physical Activity  Yes       Intervention  Provide advice, education, support and counseling about physical activity/exercise needs.;Develop an individualized exercise prescription for aerobic and resistive training based on initial evaluation findings, risk stratification, comorbidities and participant's personal goals.  Expected Outcomes  Short Term: Attend rehab on a regular basis to increase amount of physical activity.;Long Term: Add in home exercise to make exercise part of routine and to increase amount of physical activity.;Long Term: Exercising regularly at least 3-5 days a week.       Increase  Strength and Stamina  Yes       Intervention  Provide advice, education, support and counseling about physical activity/exercise needs.;Develop an individualized exercise prescription for aerobic and resistive training based on initial evaluation findings, risk stratification, comorbidities and participant's personal goals.       Expected Outcomes  Short Term: Increase workloads from initial exercise prescription for resistance, speed, and METs.;Short Term: Perform resistance training exercises routinely during rehab and add in resistance training at home;Long Term: Improve cardiorespiratory fitness, muscular endurance and strength as measured by increased METs and functional capacity (6MWT)       Able to understand and use rate of perceived exertion (RPE) scale  Yes       Intervention  Provide education and explanation on how to use RPE scale       Expected Outcomes  Short Term: Able to use RPE daily in rehab to express subjective intensity level;Long Term:  Able to use RPE to guide intensity level when exercising independently       Knowledge and understanding of Target Heart Rate Range (THRR)  Yes       Intervention  Provide education and explanation of THRR including how the numbers were predicted and where they are located for reference       Expected Outcomes  Short Term: Able to state/look up THRR;Long Term: Able to use THRR to govern intensity when exercising independently;Short Term: Able to use daily as guideline for intensity in rehab       Able to check pulse independently  Yes       Intervention  Provide education and demonstration on how to check pulse in carotid and radial arteries.;Review the importance of being able to check your own pulse for safety during independent exercise       Expected Outcomes  Short Term: Able to explain why pulse checking is important during independent exercise;Long Term: Able to check pulse independently and accurately       Understanding of Exercise  Prescription  Yes       Intervention  Provide education, explanation, and written materials on patient's individual exercise prescription       Expected Outcomes  Short Term: Able to explain program exercise prescription;Long Term: Able to explain home exercise prescription to exercise independently          Exercise Goals Re-Evaluation: Exercise Goals Re-Evaluation    [place] Name 07/07/18 1233             Exercise Goal Re-Evaluation   Exercise Goals Review  Increase Physical Activity;Increase Strength and Stamina;Able to understand and use rate of perceived exertion (RPE) scale;Knowledge and understanding of Target Heart Rate Range (THRR);Able to check pulse independently;Understanding of Exercise Prescription       Comments  Reviewed HEP with pt. Pt voices understanding.           Nutrition & Weight - Outcomes: Pre Biometrics - 06/10/18 1048      Pre Biometrics   Height  5' 7"  (1.702 m)    Weight  89.7 kg    Waist Circumference  41 inches    Hip Circumference  41.75 inches    Waist to Hip Ratio  0.98 %  BMI (Calculated)  30.97    Triceps Skinfold  15.5 mm    % Body Fat  29.5 %    Grip Strength  35.5 kg    Flexibility  0 in    Single Leg Stand  1.56 seconds        Nutrition: Nutrition Therapy & Goals - 06/10/18 1559      Nutrition Therapy   Diet  heart healthy, carb modified      Personal Nutrition Goals   Nutrition Goal  Pt to identify and limit food sources of saturated fat, trans fat, refined carbohydrates and sodium    Personal Goal #2  Pt to identify food quantities necessary to achieve weight loss of 6-24 lbs. at graduation from cardiac rehab    Personal Goal #3  Pt to eat a variety of non-starchy vegetables      Intervention Plan   Intervention  Prescribe, educate and counsel regarding individualized specific dietary modifications aiming towards targeted core components such as weight, hypertension, lipid management, diabetes, heart failure and other  comorbidities.    Expected Outcomes  Short Term Goal: Understand basic principles of dietary content, such as calories, fat, sodium, cholesterol and nutrients.;Long Term Goal: Adherence to prescribed nutrition plan.       Nutrition Discharge: Nutrition Assessments - 06/10/18 1608      MEDFICTS Scores   Pre Score  39       Education Questionnaire Score: Knowledge Questionnaire Score - 06/10/18 1050      Knowledge Questionnaire Score   Pre Score  19/24       Goals reviewed with patient; copy given to patient.

## 2018-11-10 NOTE — Addendum Note (Signed)
Encounter addended by: Noel Christmas, RN on: 11/10/2018 2:32 PM  Actions taken: Clinical Note Signed

## 2019-01-09 NOTE — Telephone Encounter (Signed)
From pt

## 2019-01-20 ENCOUNTER — Other Ambulatory Visit: Payer: Self-pay | Admitting: Cardiology

## 2019-01-30 ENCOUNTER — Other Ambulatory Visit: Payer: Self-pay

## 2019-01-30 ENCOUNTER — Encounter: Payer: Self-pay | Admitting: Cardiology

## 2019-01-30 ENCOUNTER — Ambulatory Visit (INDEPENDENT_AMBULATORY_CARE_PROVIDER_SITE_OTHER): Payer: Medicare Other | Admitting: Cardiology

## 2019-01-30 VITALS — BP 123/71 | HR 55 | Temp 97.1°F | Ht 67.0 in | Wt 207.0 lb

## 2019-01-30 DIAGNOSIS — I255 Ischemic cardiomyopathy: Secondary | ICD-10-CM | POA: Diagnosis not present

## 2019-01-30 DIAGNOSIS — I25810 Atherosclerosis of coronary artery bypass graft(s) without angina pectoris: Secondary | ICD-10-CM

## 2019-01-30 DIAGNOSIS — I10 Essential (primary) hypertension: Secondary | ICD-10-CM

## 2019-01-30 NOTE — Progress Notes (Signed)
Patient is here for follow up visit.  Subjective:   @Patient  ID: Nathan Medina, male    DOB: 1950-07-26, 68 y.o.   MRN: [id number]  Chief Complaint  Patient presents with  . Coronary Artery Disease  . Congestive Heart Failure  . Follow-up    3 month     68 year old Caucasian male with hypertension, CKD stage III, and 2 diabetes mellitus, ischemic cardiomyopathy with recovered EF, coronary artery disease s/p CABGX3 (LIMA-LAD, SVG-dRCA, SVG-ramus) by Dr. Servando Snare on 04/01/2018, stable renal and lung nodules followed by specialists, s/p thyroidectomy.  Patient has been doing fairly well from cardiac standpoint.  He denies any dyspnea on exertion.  He has mild stable leg edema.  He is currently using Lasix 40 mg every other day.  On a separate note, he has noticed increased frequency of urination and is going to see his primary care doctor for evaluation of possible UTI.  Few weeks ago, he had stopped Xarelto after he started itching.  However, patient has persisted 3 weeks after stopping Xarelto and only cleared after a course of prednisone.  Patient wants to know if he can resume Xarelto.  Past Medical History:  Diagnosis Date  . Abnormal liver function test 05/23/2011  . ALLERGIC RHINITIS   . ANXIETY   . Aortic atherosclerosis ([place])   . Arthritis   . Ascending aorta dilation ([facility]) 07/24/2018  . Bilateral renal cysts   . Cervical spondylolysis    Moderate  . CHF (congestive heart failure) ([place]) 07/24/2018  . Chronic kidney disease    CKD stage 3 per office visit note 08/08/17 on chart   . COLONIC POLYPS, HX OF   . Coronary artery disease   . DEPRESSION   . DIABETES MELLITUS, TYPE II   . Diverticulitis   . Epidermal cyst   . Fatty liver   . GERD   . GOUT   . History of inguinal hernia   . HOH (hard of hearing)    elft ear  . HYPERLIPIDEMIA   . HYPERTENSION   . IBS   . 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 Ischemic cardiomyopathy 07/24/2018  . Left renal mass 05/23/2011  . Lumbar radicular pain 06/03/2011  . Macular degeneration    right eye  . Multinodular goiter   . Pancreatic pseudocyst    Stable  . Peritonsillar abscess   . Pulmonary nodule    Right upper lobe  . Radial neck fracture   . Right upper lobe pulmonary nodule 07/24/2018  . Shortness of breath    occasional shortness of breath  with exertion  . Thyroid disease   . Thyroid nodule    Bilateral  . Vertigo     Past Surgical History:  Procedure Laterality Date  . APPENDECTOMY  02/12/11  . COLON SURGERY  08/11/10   sigmoid colectomy  . COLON SURGERY  02/12/11   colostomy takedown  . COLONOSCOPY    . CORONARY ARTERY BYPASS GRAFT N/A 04/01/2018   Procedure: CORONARY ARTERY BYPASS GRAFTING (CABG) x Three, using left internal mammary artery and right leg greater saphenous vein harvested endoscopically;  Surgeon: Grace Isaac, MD;  Location: [facility];  Service: Open Heart Surgery;  Laterality: N/A;  . CYST REMOVAL TRUNK Right 02/20/2018   Procedure: epidermal cyst excision right lower back;  Surgeon: [name], MD;  Location: [facility];  Service: General;  Laterality: Right;  . HERNIA REPAIR    .  INSERTION OF MESH N/A 08/20/2012   Procedure: INSERTION OF MESH;  Surgeon: [name], MD;  Location: [facility];  Service: General;  Laterality: N/A;  . LEFT HEART CATH AND CORONARY ANGIOGRAPHY N/A 01/14/2018   Procedure: LEFT HEART CATH AND CORONARY ANGIOGRAPHY;  Surgeon: [name], MD;  Location: [facility];  Service: Cardiovascular;  Laterality: N/A;  . LYSIS OF ADHESION  08/20/2012   Procedure: LYSIS OF ADHESION;  Surgeon: [name], MD;  Location: [facility];  Service: General;;  . SEPTOPLASTY  age 65  . TEE WITHOUT CARDIOVERSION N/A 04/01/2018   Procedure: TRANSESOPHAGEAL ECHOCARDIOGRAM (TEE);  Surgeon: Grace Isaac, MD;  Location: [facility];  Service: Open Heart Surgery;  Laterality:  N/A;  . THYROIDECTOMY N/A 10/17/2017   Procedure: TOTAL THYROIDECTOMY;  Surgeon: [name], MD;  Location: [facility];  Service: General;  Laterality: N/A;  . TONSILLECTOMY Right 08/01/2016   Procedure: INCISION AND DRAINAGE RIGHT PERI-TONSILLAR ABSCESS;  Surgeon: [name], MD;  Location: [facility];  Service: ENT;  Laterality: Right;  . ULTRASOUND GUIDANCE FOR VASCULAR ACCESS  01/14/2018   Procedure: Ultrasound Guidance For Vascular Access;  Surgeon: [name], MD;  Location: [facility];  Service: Cardiovascular;;  . VENTRAL HERNIA REPAIR  08/20/2012   Procedure: HERNIA REPAIR VENTRAL ADULT;  Surgeon: [name], MD;  Location: [facility];  Service: General;;    Social History   Socioeconomic History  . Marital status: Single    Spouse name: Not on file  . Number of children: 0  . Years of education: Not on file  . Highest education level: Not on file  Occupational History  . Not on file  Social Needs  . Financial resource strain: Not on file  . Food insecurity    Worry: Not on file    Inability: Not on file  . Transportation needs    Medical: Not on file    Non-medical: Not on file  Tobacco Use  . Smoking status: Never Smoker  . Smokeless tobacco: Never Used  Substance and Sexual Activity  . Alcohol use: Yes    Comment: occ  . Drug use: No  . Sexual activity: Not on file  Lifestyle  . Physical activity    Days per week: Not on file    Minutes per session: Not on file  . Stress: Not on file  Relationships  . Social Herbalist on phone: Not on file    Gets together: Not on file    Attends religious service: Not on file    Active member of club or organization: Not on file    Attends meetings of clubs or organizations: Not on file    Relationship status: Not on file  . Intimate partner violence    Fear of current or ex partner: Not on file    Emotionally abused: Not on file    Physically abused: Not on file    Forced sexual activity: Not  on file  Other Topics Concern  . Not on file  Social History Narrative  . Not on file    Current Outpatient Medications on File Prior to Visit  Medication Sig Dispense Refill  . acetaminophen (TYLENOL) 325 MG tablet Take 2 tablets (650 mg total) by mouth every 6 (six) hours as needed for mild pain (or Fever >/= 101). 20 tablet 0  . allopurinol (ZYLOPRIM) 300 MG tablet TAKE 1 TABLET BY MOUTH EVERY  DAY (Patient taking differently: Take 300 mg by mouth daily. ) 90 tablet 0  . ALPRAZolam (XANAX) 0.5 MG tablet Take 1 tablet (0.5 mg total) by mouth daily as needed for sleep. 1/2 - 1 by mouth once daily as needed (Patient taking differently: Take 0.25-0.5 mg by mouth at bedtime as needed for sleep. ) 90 tablet 2  . aspirin EC 81 MG tablet Take 81 mg by mouth daily.    [name] atorvastatin (LIPITOR) 10 MG tablet Take 10 mg by mouth at bedtime.    . calcium carbonate (TUMS) 500 MG chewable tablet Chew 2 tablets (400 mg of elemental calcium total) by mouth 2 (two) times daily. (Patient taking differently: Chew 2 tablets by mouth 2 (two) times daily as needed for indigestion. ) 90 tablet 1  . carvedilol (COREG) 6.25 MG tablet Take 1 tablet by mouth twice daily 60 tablet 0  . Cholecalciferol (VITAMIN D-3) 5000 units TABS Take 5,000 Units by mouth at bedtime.    . diphenhydrAMINE (BENADRYL) 25 MG tablet Take 25 mg by mouth at bedtime as needed for sleep.    [name] ENTRESTO 49-51 MG Take 1 tablet by mouth twice daily 120 tablet 2  . furosemide (LASIX) 40 MG tablet Take by mouth. 2 tabs qod rotating 1 tab qod    . Guaifenesin 1200 MG TB12 Take 1,200 mg by mouth 2 (two) times daily as needed (cough).    . insulin glargine (LANTUS) 100 unit/mL SOPN Inject 44 Units into the skin at bedtime.     [name] levothyroxine (SYNTHROID) 200 MCG tablet Take 200 mcg by mouth daily before breakfast.     . Multiple Vitamins-Minerals (PRESERVISION AREDS 2 PO) Take 1 tablet by mouth 2 (two) times daily.    [name] omega-3 acid ethyl esters  (LOVAZA) 1 g capsule Take 2 g by mouth 2 (two) times daily.     [name] omeprazole (PRILOSEC) 20 MG capsule Take 20 mg by mouth daily before breakfast.     . sildenafil (REVATIO) 20 MG tablet Take 100 mg by mouth daily as needed.     . traMADol (ULTRAM) 50 MG tablet Take 1-2 tablets (50-100 mg total) by mouth every 6 (six) hours as needed for moderate pain. 24 tablet 0  . triamcinolone cream (KENALOG) 0.1 % Apply 1 application topically 2 (two) times daily as needed (skin irritation).    Alveda Reasons 2.5 MG TABS tablet Take 1 tablet by mouth twice daily 60 tablet 3   No current facility-administered medications on file prior to visit.     Cardiovascular studies:  Echocardiogram 10/27/2018 :  Poor echo window. Wall motion abnormality has reduced sensitivity. Left ventricle cavity is normal in size. Mild concentric hypertrophy of the left ventricle. Doppler evidence of grade I (impaired) diastolic dysfunction, normal LAP. Mildly depressed LV systolic function with visual EF 45-50%. Abnormal septal wall motion due to post-operative coronary artery bypass graft. I cannot exclude Mid anterolateral and Apical lateral hypokinesis. Calculated EF 49%. Mild (Grade I) mitral regurgitation. The aortic root is mildly dilated at 3.8 cm. Compared to the study done on 04/24/2018, EF appears to have improved from 35 to 40%.  CT Chest w/o contrast 09/04/2018: 1. No change in a ground-glass nodule of the posterior right upper lobe measuring approximately 1.5 cm (series 3, image 31). 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 Recommend additional follow-up in 1 year and annually through 5 years of stability. 2. Partially imaged mass of the left  kidney (series 2, image 178), previously characterized as suspicious for malignancy by CT. Cystic lesion of the pancreatic tail measuring 2.9 cm (series 2, image 24). 3.  Coronary artery disease.  Post Bypass TEE  04/01/2018: Tricuspid, Pulmonic, Mitral and Aortic valve unchanged. Anterior and anteroseptal wall motion improved, LVEF > 55% (CO > 6) with vasopressor support. No dissection noted after cannula removed.    Cath 01/14/2018: LM: Normal LAD: Ostial 100% occluded. 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 RPDA distally occluded. Good surgical revascularization targets seen. LVEDP: Normal Recommendation: Diabetic patient with reduced LVEF and high Syntax score and moderate LAD territory ischemia superimposed on infarct. Recommend CVTS evaluation for CABG  Arterial duplex 11/19/2017: Normal examination. No evidence of hemodynamically significant peripheral arterial disease.  CT chest 08/06/2017: - persistent right upper lobe nodule with increase in size compared to 2018 PET scan 08/26/2017: - very low metabolic activity in the right upper lobe nodule. SUV 1.3. No other significant uptake  Labs: Results for ALISSA, DECOSMO (MRN [id number]) as of 01/30/2019 08:26  Ref. Range 07/01/2018 08:22 10/09/2018 08:34  Sodium Latest Ref Range: 134 - 144 mmol/L 140 142  Potassium Latest Ref Range: 3.5 - 5.2 mmol/L 4.4 4.3  Chloride Latest Ref Range: 96 - 106 mmol/L 102 107 (H)  CO2 Latest Ref Range: 20 - 29 mmol/L 23 23  Glucose Latest Ref Range: 65 - 99 mg/dL 116 (H) 132 (H)  BUN Latest Ref Range: 8 - 27 mg/dL 42 (H) 35 (H)  Creatinine Latest Ref Range: 0.76 - 1.27 mg/dL 1.60 (H) 1.59 (H)  Calcium Latest Ref Range: 8.6 - 10.2 mg/dL 10.0 9.5  BUN/Creatinine Ratio Latest Ref Range: 10 - 24  26 (H) 22  GFR, Est Non African American Latest Ref Range: >59 mL/min/1.73 44 (L) 44 (L)  GFR, Est African American Latest Ref Range: >59 mL/min/1.73 50 (L) 51 (L)  Cholesterol, Total Latest Ref Range: 100 - 199 mg/dL 112   HDL Cholesterol  Latest Ref Range: >39 mg/dL 32 (L)   LDL (calc) Latest Ref Range: 0 - 99 mg/dL 53   Triglycerides Latest Ref Range: 0 - 149 mg/dL 134   VLDL Cholesterol Cal Latest Ref Range: 5 - 40 mg/dL 27    Review of Systems  Constitution: Positive for malaise/fatigue. Negative for decreased appetite, weight gain and weight loss.  HENT: Negative for congestion.   Eyes: Negative for visual disturbance.  Cardiovascular: Negative for chest pain, claudication, dyspnea on exertion, leg swelling, palpitations and syncope.  Respiratory: Negative for shortness of breath.   Endocrine: Negative for cold intolerance.  Hematologic/Lymphatic: Does not bruise/bleed easily.  Skin: Negative for itching and rash.  Musculoskeletal: Negative for myalgias.  Gastrointestinal: Negative for abdominal pain, nausea and vomiting.  Genitourinary: Negative for dysuria.  Neurological: Positive for paresthesias (Bilateral legs). Negative for dizziness and weakness.  Psychiatric/Behavioral: The patient is not nervous/anxious.   All other systems reviewed and are negative.      Objective:    Vitals:   01/30/19 0824  BP: 123/71  Pulse: (!) 55  Temp: (!) 97.1 F (36.2 C)  SpO2: 99%     Physical Exam  Constitutional: He is oriented to person, place, and time. He appears well-developed and well-nourished. No distress.  HENT:  Head: Normocephalic and  atraumatic.  Eyes: Pupils are equal, round, and reactive to light. Conjunctivae are normal.  Neck: No JVD present.  Cardiovascular: Normal rate, regular rhythm and intact distal pulses.  Pulmonary/Chest: Effort normal and breath sounds normal. He has no wheezes. He has no rales.  Sternotomy scar   Abdominal: Soft. Bowel sounds are normal. There is no rebound.  Musculoskeletal:        General: Edema (Trace) present.  Lymphadenopathy:    He has no cervical adenopathy.  Neurological: He is alert and oriented to person, place, and time. No cranial nerve deficit.  Skin:  Skin is warm and dry.  Psychiatric: He has a normal mood and affect.  Nursing note and vitals reviewed.       Assessment & Recommendations:    68 year old Caucasian male with hypertension, CKD stage III, and 2 diabetes mellitus, ischemic cardiomyopathy with recovered EF, coronary artery disease s/p CABGX3 (LIMA-LAD, SVG-dRCA, SVG-ramus) by Dr. Servando Snare on 04/01/2018, stable renal and lung nodules followed by specialists, s/p thyroidectomy.  Ischemic cardiomyopathy: Recovered EF Continue Entresto to 49-51 mg bid.   We discussed resuming spironolactone to help with his leg edema.  However, patient tells me that spironolactone was stopped previously by Dr. Jimmy Footman due to concerns regarding his CKD.   Continue Coreg 3.125 mg bid for now. Continue lasix 40 mg as needed Repeat echocardiogram in 10/2018.  CAD: S/p CABG. No angina symptoms. Continue aspirin to 81 mg daily. Okay to resume Xarelto 2.5 mg bid, as I do not think itching was related to Xarelto.  Dilated aortic root: Mild dilatation. Monitor on echocardiogram  Postop Afib: No recurrence. Will monitor.  Hyperlipidemia: Well controlled. TG have also improved. Continue lipitor 10 mg daily.    Management of DM, CKD III, renal and lung nodules as per PCP and other specialists.  I will see him back in 3 months.  [name], MD [facility] Pager: [phone] Office: [phone] If no answer Cell [phone]

## 2019-02-26 ENCOUNTER — Other Ambulatory Visit: Payer: Self-pay | Admitting: Cardiology

## 2019-03-31 ENCOUNTER — Other Ambulatory Visit: Payer: Self-pay | Admitting: Cardiology

## 2019-05-04 ENCOUNTER — Ambulatory Visit: Payer: Medicare Other | Admitting: Cardiology

## 2019-05-05 ENCOUNTER — Other Ambulatory Visit: Payer: Self-pay

## 2019-05-05 ENCOUNTER — Ambulatory Visit: Payer: Medicare Other | Admitting: Cardiology

## 2019-05-05 ENCOUNTER — Encounter: Payer: Self-pay | Admitting: Cardiology

## 2019-05-05 VITALS — BP 136/84 | HR 72 | Temp 97.8°F | Ht 67.0 in | Wt 199.0 lb

## 2019-05-05 DIAGNOSIS — I77810 Thoracic aortic ectasia: Secondary | ICD-10-CM | POA: Diagnosis not present

## 2019-05-05 DIAGNOSIS — I5022 Chronic systolic (congestive) heart failure: Secondary | ICD-10-CM

## 2019-05-05 DIAGNOSIS — I10 Essential (primary) hypertension: Secondary | ICD-10-CM

## 2019-05-05 DIAGNOSIS — I25810 Atherosclerosis of coronary artery bypass graft(s) without angina pectoris: Secondary | ICD-10-CM

## 2019-05-05 DIAGNOSIS — I255 Ischemic cardiomyopathy: Secondary | ICD-10-CM | POA: Diagnosis not present

## 2019-05-05 MED ORDER — CARVEDILOL 6.25 MG PO TABS: 6.2500 mg | ORAL_TABLET | Freq: Two times a day (BID) | ORAL | 3 refills | Status: AC

## 2019-05-05 MED ORDER — CARVEDILOL 6.25 MG PO TABS: 6.2500 mg | ORAL_TABLET | Freq: Two times a day (BID) | ORAL | 3 refills | Status: DC

## 2019-05-05 NOTE — Progress Notes (Signed)
Patient is here for follow up visit.  Subjective:   @Patient  ID: Nathan Medina, male    DOB: [birth year], 68 y.o.   MRN: [id number]  Chief Complaint  Patient presents with  . Coronary Artery Disease  . Follow-up    3 month    68 year old Caucasian male with hypertension, CKD stage III, and 2 diabetes mellitus, ischemic cardiomyopathy with recovered EF, coronary artery disease s/p CABGX3 (LIMA-LAD, SVG-dRCA, SVG-ramus) by Dr. Servando Snare on 04/01/2018, stable renal and lung nodules followed by specialists, s/p thyroidectomy.  Patient has been doing fairly well from cardiac standpoint.  He denies any dyspnea on exertion.  He has occasional mild leg edema.  He is currently using Lasix 40 mg daily. He recently underwent bloodwork through his uroligist, results not available to me.    Past Medical History:  Diagnosis Date  . Abnormal liver function test 05/23/2011  . ALLERGIC RHINITIS   . ANXIETY   . Aortic atherosclerosis ([place])   . Arthritis   . Ascending aorta dilation ([facility]) 07/24/2018  . Bilateral renal cysts   . Cervical spondylolysis    Moderate  . CHF (congestive heart failure) ([place]) 07/24/2018  . Chronic kidney disease    CKD stage 3 per office visit note 08/08/17 on chart   . COLONIC POLYPS, HX OF   . Coronary artery disease   . DEPRESSION   . DIABETES MELLITUS, TYPE II   . Diverticulitis   . Epidermal cyst   . Fatty liver   . GERD   . GOUT   . History of inguinal hernia   . HOH (hard of hearing)    elft ear  . HYPERLIPIDEMIA   . HYPERTENSION   . IBS   . 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 Ischemic cardiomyopathy 07/24/2018  . Left renal mass 05/23/2011  . Lumbar radicular pain 06/03/2011  . Macular degeneration    right eye  . Multinodular goiter   . Pancreatic pseudocyst    Stable  . Peritonsillar abscess   . Pulmonary nodule    Right upper lobe  . Radial neck fracture     . Right upper lobe pulmonary nodule 07/24/2018  . Shortness of breath    occasional shortness of breath  with exertion  . Thyroid disease   . Thyroid nodule    Bilateral  . Vertigo     Past Surgical History:  Procedure Laterality Date  . APPENDECTOMY  02/12/11  . COLON SURGERY  08/11/10   sigmoid colectomy  . COLON SURGERY  02/12/11   colostomy takedown  . COLONOSCOPY    . CORONARY ARTERY BYPASS GRAFT N/A 04/01/2018   Procedure: CORONARY ARTERY BYPASS GRAFTING (CABG) x Three, using left internal mammary artery and right leg greater saphenous vein harvested endoscopically;  Surgeon: Grace Isaac, MD;  Location: [facility];  Service: Open Heart Surgery;  Laterality: N/A;  . CYST REMOVAL TRUNK Right 02/20/2018   Procedure: epidermal cyst excision right lower back;  Surgeon: [name], MD;  Location: [facility];  Service: General;  Laterality: Right;  . HERNIA REPAIR    . INSERTION OF MESH N/A 08/20/2012   Procedure: INSERTION OF MESH;  Surgeon: [name], MD;  Location: [facility];  Service: General;  Laterality: N/A;  . LEFT HEART CATH AND CORONARY ANGIOGRAPHY N/A 01/14/2018   Procedure: LEFT HEART CATH AND CORONARY ANGIOGRAPHY;  Surgeon: [name], MD;  Location:  [facility];  Service: Cardiovascular;  Laterality: N/A;  . LYSIS OF ADHESION  08/20/2012   Procedure: LYSIS OF ADHESION;  Surgeon: [name], MD;  Location: [facility];  Service: General;;  . SEPTOPLASTY  age 65  . TEE WITHOUT CARDIOVERSION N/A 04/01/2018   Procedure: TRANSESOPHAGEAL ECHOCARDIOGRAM (TEE);  Surgeon: Grace Isaac, MD;  Location: [facility];  Service: Open Heart Surgery;  Laterality: N/A;  . THYROIDECTOMY N/A 10/17/2017   Procedure: TOTAL THYROIDECTOMY;  Surgeon: [name], MD;  Location: [facility];  Service: General;  Laterality: N/A;  . TONSILLECTOMY Right 08/01/2016   Procedure: INCISION AND DRAINAGE RIGHT PERI-TONSILLAR ABSCESS;  Surgeon: [name], MD;  Location: [facility];  Service: ENT;   Laterality: Right;  . ULTRASOUND GUIDANCE FOR VASCULAR ACCESS  01/14/2018   Procedure: Ultrasound Guidance For Vascular Access;  Surgeon: [name], MD;  Location: [facility];  Service: Cardiovascular;;  . VENTRAL HERNIA REPAIR  08/20/2012   Procedure: HERNIA REPAIR VENTRAL ADULT;  Surgeon: [name], MD;  Location: [facility];  Service: General;;    Social History   Socioeconomic History  . Marital status: Single    Spouse name: Not on file  . Number of children: 0  . Years of education: Not on file  . Highest education level: Not on file  Occupational History  . Not on file  Tobacco Use  . Smoking status: Never Smoker  . Smokeless tobacco: Never Used  Substance and Sexual Activity  . Alcohol use: Yes    Comment: occ  . Drug use: No  . Sexual activity: Not on file  Other Topics Concern  . Not on file  Social History Narrative  . Not on file   Social Determinants of Health   Financial Resource Strain:   . Difficulty of Paying Living Expenses: Not on file  Food Insecurity:   . Worried About Charity fundraiser in the Last Year: Not on file  . Ran Out of Food in the Last Year: Not on file  Transportation Needs:   . Lack of Transportation (Medical): Not on file  . Lack of Transportation (Non-Medical): Not on file  Physical Activity:   . Days of Exercise per Week: Not on file  . Minutes of Exercise per Session: Not on file  Stress:   . Feeling of Stress : Not on file  Social Connections:   . Frequency of Communication with Friends and Family: Not on file  . Frequency of Social Gatherings with Friends and Family: Not on file  . Attends Religious Services: Not on file  . Active Member of Clubs or Organizations: Not on file  . Attends Archivist Meetings: Not on file  . Marital Status: Not on file  Intimate Partner Violence:   . Fear of Current or Ex-Partner: Not on file  . Emotionally Abused: Not on file  . Physically Abused: Not on file    . Sexually Abused: Not on file    Current Outpatient Medications on File Prior to Visit  Medication Sig Dispense Refill  . acetaminophen (TYLENOL) 325 MG tablet Take 2 tablets (650 mg total) by mouth every 6 (six) hours as needed for mild pain (or Fever >/= 101). 20 tablet 0  . allopurinol (ZYLOPRIM) 300 MG tablet TAKE 1 TABLET BY MOUTH EVERY DAY (Patient taking differently: Take 300 mg by mouth daily. ) 90 tablet 0  . ALPRAZolam (XANAX) 0.5 MG tablet Take 1 tablet (  0.5 mg total) by mouth daily as needed for sleep. 1/2 - 1 by mouth once daily as needed (Patient taking differently: Take 0.25-0.5 mg by mouth at bedtime as needed for sleep. ) 90 tablet 2  . aspirin EC 81 MG tablet Take 81 mg by mouth daily.    [name] atorvastatin (LIPITOR) 10 MG tablet Take 10 mg by mouth at bedtime.    . calcium carbonate (TUMS) 500 MG chewable tablet Chew 2 tablets (400 mg of elemental calcium total) by mouth 2 (two) times daily. (Patient taking differently: Chew 2 tablets by mouth 2 (two) times daily as needed for indigestion. ) 90 tablet 1  . carvedilol (COREG) 6.25 MG tablet Take 1 tablet by mouth twice daily 60 tablet 0  . Cholecalciferol (VITAMIN D-3) 5000 units TABS Take 5,000 Units by mouth at bedtime.    . diphenhydrAMINE (BENADRYL) 25 MG tablet Take 25 mg by mouth at bedtime as needed for sleep.    [name] ENTRESTO 49-51 MG Take 1 tablet by mouth twice daily 120 tablet 2  . furosemide (LASIX) 40 MG tablet Take by mouth. 2 tabs qod rotating 1 tab qod    . insulin glargine (LANTUS) 100 unit/mL SOPN Inject 44 Units into the skin at bedtime.    [name] levothyroxine (SYNTHROID) 200 MCG tablet Take 212 mcg by mouth daily before breakfast.     . Multiple Vitamins-Minerals (PRESERVISION AREDS 2 PO) Take 1 tablet by mouth 2 (two) times daily.    [name] omega-3 acid ethyl esters (LOVAZA) 1 g capsule Take 2 g by mouth 2 (two) times daily.     [name] omeprazole (PRILOSEC) 20 MG capsule Take 20 mg by mouth daily before breakfast.     .  sildenafil (REVATIO) 20 MG tablet Take 100 mg by mouth daily as needed.     . traMADol (ULTRAM) 50 MG tablet Take 1-2 tablets (50-100 mg total) by mouth every 6 (six) hours as needed for moderate pain. 24 tablet 0  . triamcinolone cream (KENALOG) 0.1 % Apply 1 application topically 2 (two) times daily as needed (skin irritation).    . TRULICITY [id number] SOPN Inject 0.75 mg into the skin once a week.    Alveda Reasons 2.5 MG TABS tablet Take 1 tablet by mouth twice daily 60 tablet 0   No current facility-administered medications on file prior to visit.    Cardiovascular studies:  Echocardiogram 10/27/2018 :  Poor echo window. Wall motion abnormality has reduced sensitivity. Left ventricle cavity is normal in size. Mild concentric hypertrophy of the left ventricle. Doppler evidence of grade I (impaired) diastolic dysfunction, normal LAP. Mildly depressed LV systolic function with visual EF 45-50%. Abnormal septal wall motion due to post-operative coronary artery bypass graft. I cannot exclude Mid anterolateral and Apical lateral hypokinesis. Calculated EF 49%. Mild (Grade I) mitral regurgitation. The aortic root is mildly dilated at 3.8 cm. Compared to the study done on 04/24/2018, EF appears to have improved from 35 to 40%.  CT Chest w/o contrast 09/04/2018: 1. No change in a ground-glass nodule of the posterior right upper lobe measuring approximately 1.5 cm (series 3, image 31). 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 Recommend additional follow-up in 1 year and annually through 5 years of stability. 2. Partially imaged mass of the left kidney (series 2, image 178), previously characterized as suspicious for malignancy by CT. Cystic lesion of the pancreatic tail measuring 2.9 cm (series 2, image 24). 3.  Coronary  artery disease.  Post Bypass TEE 04/01/2018: Tricuspid, Pulmonic, Mitral and Aortic valve unchanged. Anterior and  anteroseptal wall motion improved, LVEF > 55% (CO > 6) with vasopressor support. No dissection noted after cannula removed.    Cath 01/14/2018: LM: Normal LAD: Ostial 100% occluded. 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 RPDA distally occluded. Good surgical revascularization targets seen. LVEDP: Normal Recommendation: Diabetic patient with reduced LVEF and high Syntax score and moderate LAD territory ischemia superimposed on infarct. Recommend CVTS evaluation for CABG  Arterial duplex 11/19/2017: Normal examination. No evidence of hemodynamically significant peripheral arterial disease.  CT chest 08/06/2017: - persistent right upper lobe nodule with increase in size compared to 2018 PET scan 08/26/2017: - very low metabolic activity in the right upper lobe nodule. SUV 1.3. No other significant uptake  Labs: Results for ROLLA, HAGIE (MRN [id number]) as of 01/30/2019 08:26  Ref. Range 07/01/2018 08:22 10/09/2018 08:34  Sodium Latest Ref Range: 134 - 144 mmol/L 140 142  Potassium Latest Ref Range: 3.5 - 5.2 mmol/L 4.4 4.3  Chloride Latest Ref Range: 96 - 106 mmol/L 102 107 (H)  CO2 Latest Ref Range: 20 - 29 mmol/L 23 23  Glucose Latest Ref Range: 65 - 99 mg/dL 116 (H) 132 (H)  BUN Latest Ref Range: 8 - 27 mg/dL 42 (H) 35 (H)  Creatinine Latest Ref Range: 0.76 - 1.27 mg/dL 1.60 (H) 1.59 (H)  Calcium Latest Ref Range: 8.6 - 10.2 mg/dL 10.0 9.5  BUN/Creatinine Ratio Latest Ref Range: 10 - 24  26 (H) 22  GFR, Est Non African American Latest Ref Range: >59 mL/min/1.73 44 (L) 44 (L)  GFR, Est African American Latest Ref Range: >59 mL/min/1.73 50 (L) 51 (L)  Cholesterol, Total Latest Ref Range: 100 - 199 mg/dL 112   HDL Cholesterol Latest Ref Range: >39 mg/dL 32 (L)   LDL (calc) Latest Ref Range: 0 - 99 mg/dL 53    Triglycerides Latest Ref Range: 0 - 149 mg/dL 134   VLDL Cholesterol Cal Latest Ref Range: 5 - 40 mg/dL 27    Review of Systems  Constitution: Positive for malaise/fatigue. Negative for decreased appetite, weight gain and weight loss.  HENT: Negative for congestion.   Eyes: Negative for visual disturbance.  Cardiovascular: Negative for chest pain, claudication, dyspnea on exertion, leg swelling, palpitations and syncope.  Respiratory: Negative for shortness of breath.   Endocrine: Negative for cold intolerance.  Hematologic/Lymphatic: Does not bruise/bleed easily.  Skin: Negative for itching and rash.  Musculoskeletal: Negative for myalgias.  Gastrointestinal: Negative for abdominal pain, nausea and vomiting.  Genitourinary: Negative for dysuria.  Neurological: Positive for paresthesias (Bilateral legs). Negative for dizziness and weakness.  Psychiatric/Behavioral: The patient is not nervous/anxious.   All other systems reviewed and are negative.      Objective:    Vitals:   05/05/19 1312  BP: 136/84  Pulse: 72  Temp: 97.8 F (36.6 C)  SpO2: 97%     Physical Exam  Constitutional: He is oriented to person, place, and time. He appears well-developed and well-nourished. No distress.  HENT:  Head: Normocephalic and atraumatic.  Eyes: Pupils are equal, round, and reactive to light. Conjunctivae are normal.  Neck: No JVD present.  Cardiovascular: Normal rate, regular rhythm and intact distal pulses.  Pulmonary/Chest:  Effort normal and breath sounds normal. He has no wheezes. He has no rales.  Sternotomy scar   Abdominal: Soft. Bowel sounds are normal. There is no rebound.  Musculoskeletal:        General: Edema (Trace) present.  Lymphadenopathy:    He has no cervical adenopathy.  Neurological: He is alert and oriented to person, place, and time. No cranial nerve deficit.  Skin: Skin is warm and dry.  Psychiatric: He has a normal mood and affect.  Nursing note and vitals  reviewed.       Assessment & Recommendations:    68 year old Caucasian male with hypertension, CKD stage III, and 2 diabetes mellitus, ischemic cardiomyopathy with recovered EF, coronary artery disease s/p CABGX3 (LIMA-LAD, SVG-dRCA, SVG-ramus) by Dr. Servando Snare on 04/01/2018, stable renal and lung nodules followed by specialists, s/p thyroidectomy.  Ischemic cardiomyopathy: Recovered EF Continue Entresto to 49-51 mg bid, coreg 6.25 mg bid.  Continue lasix 40 mg. Will obtain labs from Urology. Will check repeat BMP and echocardiogram in 10/2019.  CAD: S/p CABG. No angina symptoms. Continue aspirin to 81 mg daily, Xarelto 2.5 mg bid.  Dilated aortic root: Mild dilatation. Monitor on echocardiogram, next in 10/2019.  Postop Afib: No recurrence. Will monitor.  Hyperlipidemia: Well controlled. TG have also improved. Continue lipitor 10 mg daily.    Management of DM, CKD III, renal and lung nodules as per PCP and other specialists.  I will see him back in 6 months.  [name], MD [facility] [place] Pager: [phone] Office: [phone] If no answer Cell [phone]

## 2019-05-10 ENCOUNTER — Other Ambulatory Visit: Payer: Self-pay | Admitting: Cardiology

## 2019-05-12 LAB — BASIC METABOLIC PANEL
BUN/Creatinine Ratio: 22 (ref 10–24)
BUN: 31 mg/dL — ABNORMAL HIGH (ref 8–27)
CO2: 21 mmol/L (ref 20–29)
Calcium: 9.5 mg/dL (ref 8.6–10.2)
Chloride: 103 mmol/L (ref 96–106)
Creatinine, Ser: 1.44 mg/dL — ABNORMAL HIGH (ref 0.76–1.27)
GFR calc Af Amer: 57 mL/min/{1.73_m2} — ABNORMAL LOW (ref >59)
GFR calc non Af Amer: 49 mL/min/{1.73_m2} — ABNORMAL LOW (ref >59)
Glucose: 178 mg/dL — ABNORMAL HIGH (ref 65–99)
Potassium: 4.4 mmol/L (ref 3.5–5.2)
Sodium: 141 mmol/L (ref 134–144)

## 2019-05-26 DIAGNOSIS — E119 Type 2 diabetes mellitus without complications: Secondary | ICD-10-CM | POA: Diagnosis not present

## 2019-06-01 DIAGNOSIS — E890 Postprocedural hypothyroidism: Secondary | ICD-10-CM | POA: Diagnosis not present

## 2019-06-01 DIAGNOSIS — Z7189 Other specified counseling: Secondary | ICD-10-CM | POA: Diagnosis not present

## 2019-06-01 DIAGNOSIS — E1122 Type 2 diabetes mellitus with diabetic chronic kidney disease: Secondary | ICD-10-CM | POA: Diagnosis not present

## 2019-06-01 DIAGNOSIS — N1831 Chronic kidney disease, stage 3a: Secondary | ICD-10-CM | POA: Diagnosis not present

## 2019-06-01 DIAGNOSIS — Z794 Long term (current) use of insulin: Secondary | ICD-10-CM | POA: Diagnosis not present

## 2019-06-01 DIAGNOSIS — Z202 Contact with and (suspected) exposure to infections with a predominantly sexual mode of transmission: Secondary | ICD-10-CM | POA: Diagnosis not present

## 2019-06-01 DIAGNOSIS — E785 Hyperlipidemia, unspecified: Secondary | ICD-10-CM | POA: Diagnosis not present

## 2019-06-01 DIAGNOSIS — I2510 Atherosclerotic heart disease of native coronary artery without angina pectoris: Secondary | ICD-10-CM | POA: Diagnosis not present

## 2019-06-01 DIAGNOSIS — Z85850 Personal history of malignant neoplasm of thyroid: Secondary | ICD-10-CM | POA: Diagnosis not present

## 2019-06-05 ENCOUNTER — Other Ambulatory Visit: Payer: Self-pay | Admitting: Cardiology

## 2019-06-08 DIAGNOSIS — H40013 Open angle with borderline findings, low risk, bilateral: Secondary | ICD-10-CM | POA: Diagnosis not present

## 2019-06-27 ENCOUNTER — Other Ambulatory Visit: Payer: Self-pay | Admitting: Cardiology

## 2019-06-29 DIAGNOSIS — N2889 Other specified disorders of kidney and ureter: Secondary | ICD-10-CM | POA: Diagnosis not present

## 2019-06-29 DIAGNOSIS — R809 Proteinuria, unspecified: Secondary | ICD-10-CM | POA: Diagnosis not present

## 2019-06-29 DIAGNOSIS — I2510 Atherosclerotic heart disease of native coronary artery without angina pectoris: Secondary | ICD-10-CM | POA: Diagnosis not present

## 2019-06-29 DIAGNOSIS — E1122 Type 2 diabetes mellitus with diabetic chronic kidney disease: Secondary | ICD-10-CM | POA: Diagnosis not present

## 2019-06-29 DIAGNOSIS — I129 Hypertensive chronic kidney disease with stage 1 through stage 4 chronic kidney disease, or unspecified chronic kidney disease: Secondary | ICD-10-CM | POA: Diagnosis not present

## 2019-06-29 DIAGNOSIS — N183 Chronic kidney disease, stage 3 unspecified: Secondary | ICD-10-CM | POA: Diagnosis not present

## 2019-06-29 DIAGNOSIS — M109 Gout, unspecified: Secondary | ICD-10-CM | POA: Diagnosis not present

## 2019-06-29 DIAGNOSIS — N2581 Secondary hyperparathyroidism of renal origin: Secondary | ICD-10-CM | POA: Diagnosis not present

## 2019-06-29 DIAGNOSIS — D631 Anemia in chronic kidney disease: Secondary | ICD-10-CM | POA: Diagnosis not present

## 2019-07-07 DIAGNOSIS — Z86010 Personal history of colonic polyps: Secondary | ICD-10-CM | POA: Diagnosis not present

## 2019-07-07 DIAGNOSIS — K219 Gastro-esophageal reflux disease without esophagitis: Secondary | ICD-10-CM | POA: Diagnosis not present

## 2019-07-07 DIAGNOSIS — Z9049 Acquired absence of other specified parts of digestive tract: Secondary | ICD-10-CM | POA: Diagnosis not present

## 2019-07-07 DIAGNOSIS — Z8679 Personal history of other diseases of the circulatory system: Secondary | ICD-10-CM | POA: Diagnosis not present

## 2019-07-17 ENCOUNTER — Other Ambulatory Visit: Payer: Self-pay | Admitting: Cardiology

## 2019-07-21 DIAGNOSIS — L03011 Cellulitis of right finger: Secondary | ICD-10-CM | POA: Diagnosis not present

## 2019-07-21 DIAGNOSIS — L03012 Cellulitis of left finger: Secondary | ICD-10-CM | POA: Diagnosis not present

## 2019-07-23 ENCOUNTER — Other Ambulatory Visit: Payer: Self-pay | Admitting: *Deleted

## 2019-07-23 DIAGNOSIS — R911 Solitary pulmonary nodule: Secondary | ICD-10-CM

## 2019-08-05 DIAGNOSIS — Z1159 Encounter for screening for other viral diseases: Secondary | ICD-10-CM | POA: Diagnosis not present

## 2019-08-10 DIAGNOSIS — K3189 Other diseases of stomach and duodenum: Secondary | ICD-10-CM | POA: Diagnosis not present

## 2019-08-10 DIAGNOSIS — K2980 Duodenitis without bleeding: Secondary | ICD-10-CM | POA: Diagnosis not present

## 2019-08-10 DIAGNOSIS — Z86010 Personal history of colonic polyps: Secondary | ICD-10-CM | POA: Diagnosis not present

## 2019-08-10 DIAGNOSIS — K209 Esophagitis, unspecified without bleeding: Secondary | ICD-10-CM | POA: Diagnosis not present

## 2019-08-10 DIAGNOSIS — K589 Irritable bowel syndrome without diarrhea: Secondary | ICD-10-CM | POA: Diagnosis not present

## 2019-08-10 DIAGNOSIS — K219 Gastro-esophageal reflux disease without esophagitis: Secondary | ICD-10-CM | POA: Diagnosis not present

## 2019-08-10 DIAGNOSIS — D125 Benign neoplasm of sigmoid colon: Secondary | ICD-10-CM | POA: Diagnosis not present

## 2019-08-10 DIAGNOSIS — K319 Disease of stomach and duodenum, unspecified: Secondary | ICD-10-CM | POA: Diagnosis not present

## 2019-08-10 DIAGNOSIS — K2930 Chronic superficial gastritis without bleeding: Secondary | ICD-10-CM | POA: Diagnosis not present

## 2019-08-13 DIAGNOSIS — K209 Esophagitis, unspecified without bleeding: Secondary | ICD-10-CM | POA: Diagnosis not present

## 2019-08-13 DIAGNOSIS — K319 Disease of stomach and duodenum, unspecified: Secondary | ICD-10-CM | POA: Diagnosis not present

## 2019-08-13 DIAGNOSIS — D125 Benign neoplasm of sigmoid colon: Secondary | ICD-10-CM | POA: Diagnosis not present

## 2019-08-13 DIAGNOSIS — K2930 Chronic superficial gastritis without bleeding: Secondary | ICD-10-CM | POA: Diagnosis not present

## 2019-08-17 MED ORDER — XARELTO 2.5 MG PO TABS: 2.5000 mg | ORAL_TABLET | Freq: Two times a day (BID) | ORAL | Status: DC

## 2019-08-17 NOTE — Telephone Encounter (Signed)
Please refill Xarelto 2.5 mg bid 180 pills X3 refills and let patient know.  Thanks MJP

## 2019-08-17 NOTE — Telephone Encounter (Signed)
Called pt no answer, left a vm about his medication.

## 2019-08-17 NOTE — Telephone Encounter (Signed)
Please read

## 2019-08-28 DIAGNOSIS — L609 Nail disorder, unspecified: Secondary | ICD-10-CM | POA: Diagnosis not present

## 2019-09-01 DIAGNOSIS — E890 Postprocedural hypothyroidism: Secondary | ICD-10-CM | POA: Diagnosis not present

## 2019-09-01 DIAGNOSIS — Z85850 Personal history of malignant neoplasm of thyroid: Secondary | ICD-10-CM | POA: Diagnosis not present

## 2019-09-01 DIAGNOSIS — E1122 Type 2 diabetes mellitus with diabetic chronic kidney disease: Secondary | ICD-10-CM | POA: Diagnosis not present

## 2019-09-01 DIAGNOSIS — I2510 Atherosclerotic heart disease of native coronary artery without angina pectoris: Secondary | ICD-10-CM | POA: Diagnosis not present

## 2019-09-01 DIAGNOSIS — N1831 Chronic kidney disease, stage 3a: Secondary | ICD-10-CM | POA: Diagnosis not present

## 2019-09-01 DIAGNOSIS — Z794 Long term (current) use of insulin: Secondary | ICD-10-CM | POA: Diagnosis not present

## 2019-09-02 DIAGNOSIS — L03011 Cellulitis of right finger: Secondary | ICD-10-CM | POA: Diagnosis not present

## 2019-09-07 ENCOUNTER — Other Ambulatory Visit: Payer: Self-pay

## 2019-09-07 MED ORDER — ENTRESTO 49-51 MG PO TABS: 1.0000 | ORAL_TABLET | Freq: Two times a day (BID) | ORAL | 1 refills | Status: DC

## 2019-09-10 ENCOUNTER — Ambulatory Visit: Payer: Medicare Other | Admitting: Cardiothoracic Surgery

## 2019-09-10 ENCOUNTER — Ambulatory Visit
Admission: RE | Admit: 2019-09-10 | Discharge: 2019-09-10 | Disposition: A | Discharge status: Home / Self Care | Payer: Medicare Other | Source: Ambulatory Visit | Attending: Cardiothoracic Surgery | Admitting: Cardiothoracic Surgery

## 2019-09-10 ENCOUNTER — Other Ambulatory Visit: Payer: Self-pay

## 2019-09-10 VITALS — BP 142/98 | HR 58 | Temp 97.7°F | Resp 20 | Ht 67.5 in | Wt 209.8 lb

## 2019-09-10 DIAGNOSIS — Z951 Presence of aortocoronary bypass graft: Secondary | ICD-10-CM

## 2019-09-10 DIAGNOSIS — R911 Solitary pulmonary nodule: Secondary | ICD-10-CM | POA: Diagnosis not present

## 2019-09-10 NOTE — Progress Notes (Signed)
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 POSTOPERATIVE DIAGNOSIS:  Coronary occlusive disease with decreased left ventricular function and high risk stress test. SURGICAL PROCEDURE:  Coronary artery bypass grafting x3 with the left internal mammary to the left anterior descending coronary artery, reverse saphenous vein graft to the distal right coronary artery, reverse saphenous vein graft to the ramus  intermedius with right thigh greater saphenous endoscopic vein harvesting and cardiopulmonary bypass. SURGEON:  Lanelle Bal, MD  History of Present Illness:     Patient seen today in follow-up after coronary artery bypass grafting November 2019 and follow up for history of groundglass opacity right upper lung.  Patient was originally seen for the incidental finding of a groundglass opacity in his right upper lobe.  This is been noticed on CT scan in March 2019, and again repeated evaluation in August 2019.  During his evaluation he was also referred to urology and is been followed for abnormal CT of the kidney.  He then underwent cardiac catheterization was found to have significantly decreased LV function and severe three-vessel coronary artery disease and underwent coronary artery bypass grafting x3 April 01, 2018.   Patient is a lifelong non-smoker.      Zubrod Score: At the time of surgery  this patient's most appropriate activity status/level should be described as: []     0    Normal activity, no symptoms [x]     1    Restricted in physical strenuous activity but ambulatory, able to do out light work []     2    Ambulatory and capable of self care, unable to do work activities, up and about                 >50 % of waking hours                                                                                   []     3    Only limited self care, in bed greater than 50% of waking hours []     4    Completely disabled, no self care, confined to bed or chair []     5    Moribund  Social History   Tobacco Use  Smoking Status Never Smoker  Smokeless Tobacco Never Used       Allergies  Allergen Reactions  . Ciprofloxacin Rash  . Escitalopram Oxalate Itching  . Quinapril Hcl Rash  . Sulfa Antibiotics Swelling    Swelling in the  ankles    Current Outpatient Medications  Medication Sig Dispense Refill  . acetaminophen (TYLENOL) 325 MG tablet Take 2 tablets (650 mg total) by mouth every 6 (six) hours as needed for mild pain (or Fever >/= 101). 20 tablet 0  . allopurinol (ZYLOPRIM) 300 MG tablet TAKE 1 TABLET BY MOUTH EVERY DAY (Patient taking differently: Take 300 mg by mouth daily. ) 90 tablet 0  . ALPRAZolam (XANAX) 0.5 MG tablet Take 1 tablet (0.5 mg total) by mouth daily as needed for sleep. 1/2 - 1 by mouth once daily as needed (Patient taking differently: Take 0.25-0.5 mg by mouth at bedtime as needed for sleep. ) 90 tablet 2  . aspirin EC 81 MG tablet Take 81 mg by mouth daily.    [name] atorvastatin (LIPITOR) 10 MG tablet Take 10 mg by mouth at bedtime.    . calcium carbonate (TUMS) 500 MG chewable tablet Chew 2 tablets (400 mg of elemental calcium total) by mouth 2 (two) times daily. (Patient taking differently: Chew 2 tablets by mouth 2 (two) times daily as needed for indigestion. ) 90 tablet 1  . carvedilol (COREG) 6.25 MG tablet Take 1 tablet (6.25 mg total) by mouth 2 (two)  times daily. 180 tablet 3  . Cholecalciferol (VITAMIN D-3) 5000 units TABS Take 5,000 Units by mouth at bedtime.    . diphenhydrAMINE (BENADRYL) 25 MG tablet Take 25 mg by mouth at bedtime as needed for sleep.    . furosemide (LASIX) 40 MG tablet Take by mouth. 2 tabs qod rotating 1 tab qod    . insulin glargine (LANTUS) 100 unit/mL SOPN Inject 44 Units into the skin at bedtime.    [name] levothyroxine (SYNTHROID) 200 MCG tablet Take 212 mcg by mouth daily before breakfast.     . Multiple Vitamins-Minerals (PRESERVISION AREDS 2 PO) Take 1 tablet by mouth 2 (two) times daily.    [name] omega-3 acid ethyl esters (LOVAZA) 1 g capsule Take 2 g by mouth 2 (two) times daily.     [name] omeprazole (PRILOSEC) 20 MG capsule Take 20 mg by mouth daily before breakfast.     . rivaroxaban (XARELTO) 2.5 MG TABS tablet Take 1 tablet (2.5 mg total) by mouth 2 (two) times daily. 180 tablet 03  . sacubitril-valsartan (ENTRESTO) 49-51 MG Take 1 tablet by mouth 2 (two) times daily. 180 tablet 1  . sildenafil (REVATIO) 20 MG tablet Take 100 mg by mouth daily as needed.     . traMADol (ULTRAM) 50 MG tablet Take 1-2 tablets (50-100 mg total) by mouth every 6 (six) hours as needed for moderate pain. 24 tablet 0  . triamcinolone cream (KENALOG) 0.1 % Apply 1 application topically 2 (two) times daily as needed (skin irritation).    . TRULICITY [id number] SOPN Inject 0.75 mg into the skin once a week.     No current facility-administered medications for this visit.       Physical Exam: BP (!) 142/98 Comment: manual check  Pulse (!) 58   Temp 97.7 F (36.5 C) (Temporal)   Resp 20   Ht 5' 7.5" (1.715 m)   Wt 209 lb 12.8 oz (95.2 kg)   SpO2 98% Comment: RA  BMI 32.37 kg/m   General appearance: alert, cooperative and no distress Head: Normocephalic, without obvious abnormality, atraumatic Neck: no adenopathy, no carotid bruit, no JVD, supple, symmetrical, trachea midline and thyroid not enlarged, symmetric, no  tenderness/mass/nodules Lymph nodes: Cervical, supraclavicular, and axillary nodes normal. Resp: clear  to auscultation bilaterally Cardio: regular rate and rhythm, S1, S2 normal, no murmur, click, rub or gallop GI: soft, non-tender; bowel sounds normal; no masses,  no organomegaly Extremities: extremities normal, atraumatic, no cyanosis or edema and Homans sign is negative, no sign of DVT Neurologic: Grossly normal   Diagnostic Studies & Laboratory data:         Recent Radiology Findings:  CT CHEST WO CONTRAST  Result Date: 09/10/2019 CLINICAL DATA:  69 year old male with history of pulmonary nodule. EXAM: CT CHEST WITHOUT CONTRAST TECHNIQUE: Multidetector CT imaging of the chest was performed following the standard protocol without IV contrast. COMPARISON:  Chest CT 09/04/2018. FINDINGS: Cardiovascular: Heart size is normal. There is no significant pericardial fluid, thickening or pericardial calcification. There is aortic atherosclerosis, as well as atherosclerosis of the great vessels of the mediastinum and the coronary arteries, including calcified atherosclerotic plaque in the left main, left anterior descending, left circumflex and right coronary arteries. Status post median sternotomy for CABG including LIMA to the LAD. Calcifications of the aortic valve. Mediastinum/Nodes: No pathologically enlarged mediastinal or hilar lymph nodes. Please note that accurate exclusion of hilar adenopathy is limited on noncontrast CT scans. Esophagus is unremarkable in appearance. No axillary lymphadenopathy. Lungs/Pleura: Ground-glass attenuation nodule in the right upper lobe near the apex (axial image 27 of series 8) currently measuring 1.8 x 1.7 cm with no central solid component. No other suspicious appearing pulmonary nodules or masses are noted. No acute consolidative airspace disease. No pleural effusions. Upper Abdomen: Small calcified gallstones lying dependently in the gallbladder. Incompletely  imaged exophytic low-attenuation lesion in the interpolar region of the right kidney measuring up to 5.5 cm in diameter, not characterized on today's non-contrast CT examination, but similar to the prior study and statistically likely to represent a cyst. 2.7 cm low-attenuation lesion in the body of the pancreas, similar to the prior examination, also incompletely characterized on today's noncontrast CT examination. Aortic atherosclerosis. Musculoskeletal: Median sternotomy wires. There are no aggressive appearing lytic or blastic lesions noted in the visualized portions of the skeleton. IMPRESSION: 1. Slight growth of a ground-glass attenuation nodule in the right upper lobe which currently measures 1.8 x 1.7 cm. No central solid component. Repeat noncontrast chest CT is recommended in 12 months to re-evaluate this lesion. 2. There are calcifications of the aortic valve. Echocardiographic correlation for evaluation of potential valvular dysfunction may be warranted if clinically indicated. 3. Aortic atherosclerosis, in addition to left main and 3 vessel coronary artery disease. Status post median sternotomy for CABG including LIMA to the LAD. 4. Cholelithiasis without evidence of acute cholecystitis at this time. 5. Additional incidental findings, similar to prior studies, as above. Electronically Signed   By: Vinnie Langton M.D.   On: 09/10/2019 12:23   I have independently reviewed the above radiology studies  and reviewed the findings with the patient.     Ct Chest Wo Contrast  Result Date: 09/04/2018 CLINICAL DATA:  Follow-up lung nodule EXAM: CT CHEST WITHOUT CONTRAST TECHNIQUE: Multidetector CT imaging of the chest was performed following the standard protocol without IV contrast. COMPARISON:  PET-CT, 01/01/2018, 08/26/2017, CT chest, 08/06/2017, CT abdomen pelvis, 06/11/2018 FINDINGS: Cardiovascular: Three-vessel coronary artery calcifications. Normal heart size. No pericardial effusion.  Mediastinum/Nodes: No enlarged mediastinal, hilar, or axillary lymph nodes. Thyroid gland, trachea, and esophagus demonstrate no significant findings. Lungs/Pleura: No change in a ground-glass nodule of the posterior right upper lobe measuring approximately 1.5 cm (series 3, image 31). No pleural effusion or pneumothorax. Upper  Abdomen: No acute abnormality. Partially imaged mass of the left kidney (series 2, image 178), previously characterized as suspicious for malignancy by CT. Gallstones. Cystic lesion of the pancreatic tail measuring 2.9 cm (series 2, image 24). Musculoskeletal: No chest wall mass or suspicious bone lesions identified. IMPRESSION: 1. No change in a ground-glass nodule of the posterior right upper lobe measuring approximately 1.5 cm (series 3, image 31). 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 Recommend additional follow-up in 1 year and annually through 5 years of stability. 2. Partially imaged mass of the left kidney (series 2, image 178), previously characterized as suspicious for malignancy by CT. Cystic lesion of the pancreatic tail measuring 2.9 cm (series 2, image 24). 3.  Coronary artery disease. Electronically Signed   By: Eddie Candle M.D.   On: 09/04/2018 10:40    Recent Labs: Lab Results  Component Value Date   WBC 6.0 04/08/2018   HGB 10.7 (L) 04/08/2018   HCT 34.8 (L) 04/08/2018   PLT 156 04/08/2018   GLUCOSE 178 (H) 05/11/2019   CHOL 112 07/01/2018   TRIG 134 07/01/2018   HDL 32 (L) 07/01/2018   LDLDIRECT 84.5 04/01/2012   LDLCALC 53 07/01/2018   ALT 30 04/05/2018   AST 24 04/05/2018   NA 141 05/11/2019   K 4.4 05/11/2019   CL 103 05/11/2019   CREATININE 1.44 (H) 05/11/2019   BUN 31 (H) 05/11/2019   CO2 21 05/11/2019   TSH 2.28 04/01/2012   INR 1.55 04/01/2018   HGBA1C 6.2 (H) 02/14/2018      Assessment / Plan:   #1 status post coronary artery bypass grafting, with known underlying depressed  LV function preoperative echocardiogram showed 45 to 45% ejection fraction currently patient stable from a cardiac standpoint without recurrent symptoms or evidence of congestive heart failure.  #2 history of groundglass opacity right upper lung-the groundglass opacity in the right upper lobe appears very minimally larger with no solid component.  I discussed with the patient the various options including continued surveillance, navigation bronchoscopy and biopsy, or surgical resection.  The patient is very concerned about general anesthesia.  He is agreeable to follow-up CT scan which we will arrange for 8 months from now-in CT format if we see continued enlargement pursue navigation bronchoscopy with biopsy.  #3 patient notes that he continues to have follow-up through urology for   2.6 cm enhancing lesion at lateral left lower kidney-followed by urology    Grace Isaac 09/10/2019 12:45 PM

## 2019-09-29 DIAGNOSIS — K200 Eosinophilic esophagitis: Secondary | ICD-10-CM | POA: Diagnosis not present

## 2019-09-29 DIAGNOSIS — Z8679 Personal history of other diseases of the circulatory system: Secondary | ICD-10-CM | POA: Diagnosis not present

## 2019-09-29 DIAGNOSIS — Z9049 Acquired absence of other specified parts of digestive tract: Secondary | ICD-10-CM | POA: Diagnosis not present

## 2019-09-29 DIAGNOSIS — Z86010 Personal history of colonic polyps: Secondary | ICD-10-CM | POA: Diagnosis not present

## 2019-09-29 DIAGNOSIS — K219 Gastro-esophageal reflux disease without esophagitis: Secondary | ICD-10-CM | POA: Diagnosis not present

## 2019-10-18 ENCOUNTER — Emergency Department (HOSPITAL_COMMUNITY): Payer: Medicare PPO

## 2019-10-18 ENCOUNTER — Inpatient Hospital Stay (HOSPITAL_COMMUNITY)
Admission: EM | Admit: 2019-10-18 | Discharge: 2019-10-27 | DRG: 501 | Disposition: A | Discharge status: Rehabilitation Facility | Payer: Medicare PPO | Attending: Orthopedic Surgery | Admitting: Orthopedic Surgery

## 2019-10-18 ENCOUNTER — Other Ambulatory Visit: Payer: Self-pay

## 2019-10-18 ENCOUNTER — Encounter (HOSPITAL_COMMUNITY): Payer: Self-pay

## 2019-10-18 DIAGNOSIS — F29 Unspecified psychosis not due to a substance or known physiological condition: Secondary | ICD-10-CM | POA: Diagnosis not present

## 2019-10-18 DIAGNOSIS — Z03818 Encounter for observation for suspected exposure to other biological agents ruled out: Secondary | ICD-10-CM | POA: Diagnosis not present

## 2019-10-18 DIAGNOSIS — K219 Gastro-esophageal reflux disease without esophagitis: Secondary | ICD-10-CM | POA: Diagnosis not present

## 2019-10-18 DIAGNOSIS — Z881 Allergy status to other antibiotic agents status: Secondary | ICD-10-CM

## 2019-10-18 DIAGNOSIS — W19XXXA Unspecified fall, initial encounter: Secondary | ICD-10-CM | POA: Diagnosis not present

## 2019-10-18 DIAGNOSIS — S76111A Strain of right quadriceps muscle, fascia and tendon, initial encounter: Secondary | ICD-10-CM | POA: Diagnosis present

## 2019-10-18 DIAGNOSIS — Y92008 Other place in unspecified non-institutional (private) residence as the place of occurrence of the external cause: Secondary | ICD-10-CM

## 2019-10-18 DIAGNOSIS — F419 Anxiety disorder, unspecified: Secondary | ICD-10-CM | POA: Diagnosis present

## 2019-10-18 DIAGNOSIS — I2510 Atherosclerotic heart disease of native coronary artery without angina pectoris: Secondary | ICD-10-CM | POA: Diagnosis not present

## 2019-10-18 DIAGNOSIS — S92354A Nondisplaced fracture of fifth metatarsal bone, right foot, initial encounter for closed fracture: Secondary | ICD-10-CM | POA: Diagnosis not present

## 2019-10-18 DIAGNOSIS — R52 Pain, unspecified: Secondary | ICD-10-CM | POA: Diagnosis not present

## 2019-10-18 DIAGNOSIS — M109 Gout, unspecified: Secondary | ICD-10-CM | POA: Diagnosis not present

## 2019-10-18 DIAGNOSIS — Z7982 Long term (current) use of aspirin: Secondary | ICD-10-CM

## 2019-10-18 DIAGNOSIS — I5020 Unspecified systolic (congestive) heart failure: Secondary | ICD-10-CM | POA: Diagnosis not present

## 2019-10-18 DIAGNOSIS — S8001XA Contusion of right knee, initial encounter: Secondary | ICD-10-CM | POA: Diagnosis present

## 2019-10-18 DIAGNOSIS — Z79899 Other long term (current) drug therapy: Secondary | ICD-10-CM

## 2019-10-18 DIAGNOSIS — E1122 Type 2 diabetes mellitus with diabetic chronic kidney disease: Secondary | ICD-10-CM | POA: Diagnosis not present

## 2019-10-18 DIAGNOSIS — Z751 Person awaiting admission to adequate facility elsewhere: Secondary | ICD-10-CM

## 2019-10-18 DIAGNOSIS — Z8719 Personal history of other diseases of the digestive system: Secondary | ICD-10-CM

## 2019-10-18 DIAGNOSIS — Z882 Allergy status to sulfonamides status: Secondary | ICD-10-CM

## 2019-10-18 DIAGNOSIS — S76111D Strain of right quadriceps muscle, fascia and tendon, subsequent encounter: Secondary | ICD-10-CM | POA: Diagnosis not present

## 2019-10-18 DIAGNOSIS — S9031XA Contusion of right foot, initial encounter: Secondary | ICD-10-CM | POA: Diagnosis present

## 2019-10-18 DIAGNOSIS — Z8249 Family history of ischemic heart disease and other diseases of the circulatory system: Secondary | ICD-10-CM

## 2019-10-18 DIAGNOSIS — S52502A Unspecified fracture of the lower end of left radius, initial encounter for closed fracture: Secondary | ICD-10-CM | POA: Diagnosis not present

## 2019-10-18 DIAGNOSIS — S76191A Other specified injury of right quadriceps muscle, fascia and tendon, initial encounter: Secondary | ICD-10-CM | POA: Diagnosis not present

## 2019-10-18 DIAGNOSIS — Z7401 Bed confinement status: Secondary | ICD-10-CM | POA: Diagnosis not present

## 2019-10-18 DIAGNOSIS — Z888 Allergy status to other drugs, medicaments and biological substances status: Secondary | ICD-10-CM

## 2019-10-18 DIAGNOSIS — S52502D Unspecified fracture of the lower end of left radius, subsequent encounter for closed fracture with routine healing: Secondary | ICD-10-CM | POA: Diagnosis not present

## 2019-10-18 DIAGNOSIS — M25461 Effusion, right knee: Secondary | ICD-10-CM | POA: Diagnosis not present

## 2019-10-18 DIAGNOSIS — E785 Hyperlipidemia, unspecified: Secondary | ICD-10-CM | POA: Diagnosis present

## 2019-10-18 DIAGNOSIS — S92351A Displaced fracture of fifth metatarsal bone, right foot, initial encounter for closed fracture: Secondary | ICD-10-CM | POA: Diagnosis not present

## 2019-10-18 DIAGNOSIS — I130 Hypertensive heart and chronic kidney disease with heart failure and stage 1 through stage 4 chronic kidney disease, or unspecified chronic kidney disease: Secondary | ICD-10-CM | POA: Diagnosis not present

## 2019-10-18 DIAGNOSIS — E039 Hypothyroidism, unspecified: Secondary | ICD-10-CM | POA: Diagnosis not present

## 2019-10-18 DIAGNOSIS — S92354D Nondisplaced fracture of fifth metatarsal bone, right foot, subsequent encounter for fracture with routine healing: Secondary | ICD-10-CM | POA: Diagnosis not present

## 2019-10-18 DIAGNOSIS — Z20822 Contact with and (suspected) exposure to covid-19: Secondary | ICD-10-CM | POA: Diagnosis not present

## 2019-10-18 DIAGNOSIS — H3530 Unspecified macular degeneration: Secondary | ICD-10-CM | POA: Diagnosis present

## 2019-10-18 DIAGNOSIS — I255 Ischemic cardiomyopathy: Secondary | ICD-10-CM | POA: Diagnosis present

## 2019-10-18 DIAGNOSIS — Z79890 Hormone replacement therapy (postmenopausal): Secondary | ICD-10-CM

## 2019-10-18 DIAGNOSIS — Z823 Family history of stroke: Secondary | ICD-10-CM

## 2019-10-18 DIAGNOSIS — Z951 Presence of aortocoronary bypass graft: Secondary | ICD-10-CM

## 2019-10-18 DIAGNOSIS — S86819A Strain of other muscle(s) and tendon(s) at lower leg level, unspecified leg, initial encounter: Secondary | ICD-10-CM | POA: Diagnosis present

## 2019-10-18 DIAGNOSIS — Y9301 Activity, walking, marching and hiking: Secondary | ICD-10-CM | POA: Diagnosis present

## 2019-10-18 DIAGNOSIS — T148XXA Other injury of unspecified body region, initial encounter: Secondary | ICD-10-CM | POA: Diagnosis not present

## 2019-10-18 DIAGNOSIS — M2550 Pain in unspecified joint: Secondary | ICD-10-CM | POA: Diagnosis not present

## 2019-10-18 DIAGNOSIS — Z7901 Long term (current) use of anticoagulants: Secondary | ICD-10-CM

## 2019-10-18 DIAGNOSIS — S59292A Other physeal fracture of lower end of radius, left arm, initial encounter for closed fracture: Secondary | ICD-10-CM | POA: Diagnosis not present

## 2019-10-18 DIAGNOSIS — I10 Essential (primary) hypertension: Secondary | ICD-10-CM | POA: Diagnosis not present

## 2019-10-18 DIAGNOSIS — M25532 Pain in left wrist: Secondary | ICD-10-CM | POA: Diagnosis not present

## 2019-10-18 DIAGNOSIS — R519 Headache, unspecified: Secondary | ICD-10-CM | POA: Diagnosis not present

## 2019-10-18 DIAGNOSIS — N183 Chronic kidney disease, stage 3 unspecified: Secondary | ICD-10-CM | POA: Diagnosis present

## 2019-10-18 DIAGNOSIS — W010XXA Fall on same level from slipping, tripping and stumbling without subsequent striking against object, initial encounter: Secondary | ICD-10-CM | POA: Diagnosis present

## 2019-10-18 DIAGNOSIS — M542 Cervicalgia: Secondary | ICD-10-CM | POA: Diagnosis not present

## 2019-10-18 DIAGNOSIS — G8918 Other acute postprocedural pain: Secondary | ICD-10-CM | POA: Diagnosis not present

## 2019-10-18 HISTORY — PX: KNEE ARTHROSCOPY: SHX127

## 2019-10-18 LAB — BASIC METABOLIC PANEL
Anion gap: 12 (ref 5–15)
BUN: 21 mg/dL (ref 8–23)
CO2: 23 mmol/L (ref 22–32)
Calcium: 9.4 mg/dL (ref 8.9–10.3)
Chloride: 107 mmol/L (ref 98–111)
Creatinine, Ser: 1.35 mg/dL — ABNORMAL HIGH (ref 0.61–1.24)
GFR calc Af Amer: >60 mL/min (ref >60)
GFR calc non Af Amer: 53 mL/min — ABNORMAL LOW (ref >60)
Glucose, Bld: 180 mg/dL — ABNORMAL HIGH (ref 70–99)
Potassium: 3.7 mmol/L (ref 3.5–5.1)
Sodium: 142 mmol/L (ref 135–145)

## 2019-10-18 LAB — CBC WITH DIFFERENTIAL/PLATELET
Abs Immature Granulocytes: 0.03 10*3/uL (ref 0.00–0.07)
Basophils Absolute: 0.1 10*3/uL (ref 0.0–0.1)
Basophils Relative: 1 %
Eosinophils Absolute: 0.3 10*3/uL (ref 0.0–0.5)
Eosinophils Relative: 3 %
HCT: 49.6 % (ref 39.0–52.0)
Hemoglobin: 16.6 g/dL (ref 13.0–17.0)
Immature Granulocytes: 0 %
Lymphocytes Relative: 22 %
Lymphs Abs: 1.9 10*3/uL (ref 0.7–4.0)
MCH: 32 pg (ref 26.0–34.0)
MCHC: 33.5 g/dL (ref 30.0–36.0)
MCV: 95.8 fL (ref 80.0–100.0)
Monocytes Absolute: 0.5 10*3/uL (ref 0.1–1.0)
Monocytes Relative: 6 %
Neutro Abs: 6 10*3/uL (ref 1.7–7.7)
Neutrophils Relative %: 68 %
Platelets: 136 10*3/uL — ABNORMAL LOW (ref 150–400)
RBC: 5.18 MIL/uL (ref 4.22–5.81)
RDW: 14.4 % (ref 11.5–15.5)
WBC: 8.8 10*3/uL (ref 4.0–10.5)
nRBC: 0 % (ref 0.0–0.2)

## 2019-10-18 LAB — HIV ANTIBODY (ROUTINE TESTING W REFLEX): HIV Screen 4th Generation wRfx: NONREACTIVE

## 2019-10-18 LAB — SARS CORONAVIRUS 2 BY RT PCR (HOSPITAL ORDER, PERFORMED IN ~~LOC~~ HOSPITAL LAB): SARS Coronavirus 2: NEGATIVE

## 2019-10-18 LAB — GLUCOSE, CAPILLARY: Glucose-Capillary: 130 mg/dL — ABNORMAL HIGH (ref 70–99)

## 2019-10-18 MED ORDER — HYDROMORPHONE HCL 1 MG/ML IJ SOLN
0.5000 mg | Freq: Once | INTRAMUSCULAR | Status: AC
  Administered 2019-10-18: 0.5 mg via INTRAVENOUS
  Filled 2019-10-18: qty 1

## 2019-10-18 MED ORDER — HYDROCODONE-ACETAMINOPHEN 5-325 MG PO TABS
1.0000 | ORAL_TABLET | ORAL | Status: DC | PRN
  Administered 2019-10-18: 1 via ORAL
  Filled 2019-10-18: qty 1

## 2019-10-18 MED ORDER — ACETAMINOPHEN 325 MG PO TABS
325.0000 mg | ORAL_TABLET | Freq: Four times a day (QID) | ORAL | Status: DC | PRN
  Administered 2019-10-19: 650 mg via ORAL
  Filled 2019-10-18: qty 2

## 2019-10-18 MED ORDER — METHOCARBAMOL 500 MG PO TABS
500.0000 mg | ORAL_TABLET | Freq: Four times a day (QID) | ORAL | Status: DC | PRN
  Administered 2019-10-18 – 2019-10-19 (×2): 500 mg via ORAL
  Filled 2019-10-18 (×2): qty 1

## 2019-10-18 MED ORDER — POLYETHYLENE GLYCOL 3350 17 G PO PACK: 17.0000 g | PACK | Freq: Every day | ORAL | Status: DC | PRN

## 2019-10-18 MED ORDER — HYDROCODONE-ACETAMINOPHEN 7.5-325 MG PO TABS
1.0000 | ORAL_TABLET | ORAL | Status: DC | PRN
  Administered 2019-10-19: 1 via ORAL
  Filled 2019-10-18: qty 2

## 2019-10-18 MED ORDER — DOCUSATE SODIUM 100 MG PO CAPS
100.0000 mg | ORAL_CAPSULE | Freq: Two times a day (BID) | ORAL | Status: DC
  Administered 2019-10-18 – 2019-10-19 (×3): 100 mg via ORAL
  Filled 2019-10-18 (×3): qty 1

## 2019-10-18 MED ORDER — SODIUM CHLORIDE 0.9 % IV SOLN: INTRAVENOUS | Status: DC

## 2019-10-18 MED ORDER — METHOCARBAMOL 1000 MG/10ML IJ SOLN
500.0000 mg | Freq: Four times a day (QID) | INTRAVENOUS | Status: DC | PRN
  Filled 2019-10-18: qty 5

## 2019-10-18 MED ORDER — MORPHINE SULFATE (PF) 2 MG/ML IV SOLN: 0.5000 mg | INTRAVENOUS | Status: DC | PRN

## 2019-10-18 NOTE — Plan of Care (Signed)

## 2019-10-18 NOTE — ED Provider Notes (Signed)
[facility] EMERGENCY DEPARTMENT Provider Note   CSN: [id number] Arrival date & time: 10/18/19  [id number]     History Chief Complaint  Patient presents with  . Fall    Nathan Medina is a 69 y.o. male.  HPI HPI Comments: Nathan Medina is a 69 y.o. male who presents to the Emergency Department complaining of a fall.  Patient was walking down his hall this morning and slipped in "cat vomit".  He states he fell to his left side and landed on his left wrist.  He is right-hand dominant.  He additionally notes striking his right knee but is unsure how.  He reports associated mild pain in the left wrist and exquisite pain along the right knee.  There is some moderate swelling circumferentially around the right knee.  Patient does not remember striking his head but there is a small abrasion with no bleeding to his left parietal scalp.  No hematoma.  Patient is anticoagulated.  No fevers, chills, chest pain, shortness of breath, abdominal pain, nausea, vomiting, diarrhea, syncope.    Past Medical History:  Diagnosis Date  . Abnormal liver function test 05/23/2011  . ALLERGIC RHINITIS   . ANXIETY   . Aortic atherosclerosis ([place])   . Arthritis   . Ascending aorta dilation (HCC) 07/24/2018  . Bilateral renal cysts   . Cervical spondylolysis    Moderate  . CHF (congestive heart failure) ([place]) 07/24/2018  . Chronic kidney disease    CKD stage 3 per office visit note 08/08/17 on chart   . COLONIC POLYPS, HX OF   . Coronary artery disease   . DEPRESSION   . DIABETES MELLITUS, TYPE II   . Diverticulitis   . Epidermal cyst   . Fatty liver   . GERD   . GOUT   . History of inguinal hernia   . HOH (hard of hearing)    elft ear  . HYPERLIPIDEMIA   . HYPERTENSION   . IBS   . 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 Ischemic cardiomyopathy 07/24/2018  . Left renal mass 05/23/2011  . Lumbar  radicular pain 06/03/2011  . Macular degeneration    right eye  . Multinodular goiter   . Pancreatic pseudocyst    Stable  . Peritonsillar abscess   . Pulmonary nodule    Right upper lobe  . Radial neck fracture   . Right upper lobe pulmonary nodule 07/24/2018  . Shortness of breath    occasional shortness of breath  with exertion  . Thyroid disease   . Thyroid nodule    Bilateral  . Vertigo     Patient Active Problem List   Diagnosis Date Noted  . Ischemic cardiomyopathy 07/24/2018  . Right upper lobe pulmonary nodule 07/24/2018  . CHF (congestive heart failure) ([place]) 07/24/2018  . Ascending aorta dilation (HCC) 07/24/2018  . 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 Coronary artery disease 01/15/2018  . S/P thyroidectomy 01/15/2018  . S/P cardiac cath 01/14/2018  . Abnormal stress test 01/12/2018  . 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 Multiple thyroid nodules 10/16/2017  . Peritonsillar abscess 08/01/2016  . Renal failure (ARF), acute on chronic (HCC) 08/01/2016  . Tonsil, abscess 07/31/2016  . Back pain 04/03/2012  . Ventral hernia 10/22/2011  . Lumbar radicular pain 06/03/2011  . PSA elevation 06/01/2011  . Left renal mass 05/23/2011  . Abnormal liver function  test 05/23/2011  . Rash 04/02/2011  . Hearing loss 11/28/2010  . Preventative health care 09/17/2010  . 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 PERIPHERAL EDEMA 11/04/2009  . Epidermal cyst 08/01/2007  . Type 2 diabetes mellitus with renal manifestations ([place]) 06/25/2007  . HYPERLIPIDEMIA 06/25/2007  . GOUT 06/25/2007  . ANXIETY 06/25/2007  . DEPRESSION 06/25/2007  . Essential hypertension 06/25/2007  . ALLERGIC RHINITIS 06/25/2007  . GERD 06/25/2007  . IBS 06/25/2007  . COLONIC POLYPS, HX OF 06/25/2007    Past Surgical History:  Procedure Laterality Date  . APPENDECTOMY  02/12/11  . COLON SURGERY  08/11/10   sigmoid colectomy  . COLON SURGERY  02/12/11   colostomy takedown  .  COLONOSCOPY    . CORONARY ARTERY BYPASS GRAFT N/A 04/01/2018   Procedure: CORONARY ARTERY BYPASS GRAFTING (CABG) x Three, using left internal mammary artery and right leg greater saphenous vein harvested endoscopically;  Surgeon: Grace Isaac, MD;  Location: [facility];  Service: Open Heart Surgery;  Laterality: N/A;  . CYST REMOVAL TRUNK Right 02/20/2018   Procedure: epidermal cyst excision right lower back;  Surgeon: [name], MD;  Location: [facility];  Service: General;  Laterality: Right;  . HERNIA REPAIR    . INSERTION OF MESH N/A 08/20/2012   Procedure: INSERTION OF MESH;  Surgeon: [name], MD;  Location: [facility];  Service: General;  Laterality: N/A;  . LEFT HEART CATH AND CORONARY ANGIOGRAPHY N/A 01/14/2018   Procedure: LEFT HEART CATH AND CORONARY ANGIOGRAPHY;  Surgeon: [name], MD;  Location: [facility];  Service: Cardiovascular;  Laterality: N/A;  . LYSIS OF ADHESION  08/20/2012   Procedure: LYSIS OF ADHESION;  Surgeon: [name], MD;  Location: [facility];  Service: General;;  . SEPTOPLASTY  age 69  . TEE WITHOUT CARDIOVERSION N/A 04/01/2018   Procedure: TRANSESOPHAGEAL ECHOCARDIOGRAM (TEE);  Surgeon: Grace Isaac, MD;  Location: [facility];  Service: Open Heart Surgery;  Laterality: N/A;  . THYROIDECTOMY N/A 10/17/2017   Procedure: TOTAL THYROIDECTOMY;  Surgeon: [name], MD;  Location: [facility];  Service: General;  Laterality: N/A;  . TONSILLECTOMY Right 08/01/2016   Procedure: INCISION AND DRAINAGE RIGHT PERI-TONSILLAR ABSCESS;  Surgeon: [name], MD;  Location: [facility];  Service: ENT;  Laterality: Right;  . ULTRASOUND GUIDANCE FOR VASCULAR ACCESS  01/14/2018   Procedure: Ultrasound Guidance For Vascular Access;  Surgeon: [name], MD;  Location: [facility];  Service: Cardiovascular;;  . VENTRAL HERNIA REPAIR  08/20/2012   Procedure: HERNIA REPAIR VENTRAL ADULT;  Surgeon: [name], MD;  Location: [facility];  Service: General;;        Family History  Problem Relation Age of Onset  . Hypertension Paternal Aunt   . Stroke Maternal Grandmother   . Hypertension Maternal Grandmother     Social History   Tobacco Use  . Smoking status: Never Smoker  . Smokeless tobacco: Never Used  Substance Use Topics  . Alcohol use: Yes    Comment: occ  . Drug use: No    Home Medications Prior to Admission medications   Medication Sig Start Date End Date Taking? Authorizing Provider  acetaminophen (TYLENOL) 325 MG tablet Take 2 tablets (650 mg total) by mouth every 6 (six) hours as needed for mild pain (or Fever >/= 101). 08/03/16   Arrien, Jimmy Picket, MD  allopurinol (ZYLOPRIM) 300 MG tablet TAKE 1 TABLET BY MOUTH EVERY DAY Patient taking differently: Take  300 mg by mouth daily.  05/16/13   Biagio Borg, MD  ALPRAZolam Duanne Moron) 0.5 MG tablet Take 1 tablet (0.5 mg total) by mouth daily as needed for sleep. 1/2 - 1 by mouth once daily as needed Patient taking differently: Take 0.25-0.5 mg by mouth at bedtime as needed for sleep.  04/03/12   Biagio Borg, MD  aspirin EC 81 MG tablet Take 81 mg by mouth daily.    [provider]  atorvastatin (LIPITOR) 10 MG tablet Take 10 mg by mouth at bedtime.    [provider]  calcium carbonate (TUMS) 500 MG chewable tablet Chew 2 tablets (400 mg of elemental calcium total) by mouth 2 (two) times daily. Patient taking differently: Chew 2 tablets by mouth 2 (two) times daily as needed for indigestion.  10/18/17   [name], MD  carvedilol (COREG) 6.25 MG tablet Take 1 tablet (6.25 mg total) by mouth 2 (two) times daily. 05/05/19   Patwardhan, Reynold Bowen, MD  Cholecalciferol (VITAMIN D-3) 5000 units TABS Take 5,000 Units by mouth at bedtime.    [provider]  diphenhydrAMINE (BENADRYL) 25 MG tablet Take 25 mg by mouth at bedtime as needed for sleep.    [provider]  furosemide (LASIX) 40 MG tablet Take by mouth. 2 tabs qod rotating 1 tab qod     [provider]  insulin glargine (LANTUS) 100 unit/mL SOPN Inject 44 Units into the skin at bedtime.    [provider]  levothyroxine (SYNTHROID) 200 MCG tablet Take 212 mcg by mouth daily before breakfast.     [provider]  Multiple Vitamins-Minerals (PRESERVISION AREDS 2 PO) Take 1 tablet by mouth 2 (two) times daily.    [provider]  omega-3 acid ethyl esters (LOVAZA) 1 g capsule Take 2 g by mouth 2 (two) times daily.     [provider]  omeprazole (PRILOSEC) 20 MG capsule Take 20 mg by mouth daily before breakfast.     [provider]  rivaroxaban (XARELTO) 2.5 MG TABS tablet Take 1 tablet (2.5 mg total) by mouth 2 (two) times daily. 08/17/19   Patwardhan, Manish J, MD  sacubitril-valsartan (ENTRESTO) 49-51 MG Take 1 tablet by mouth 2 (two) times daily. 09/07/19   Patwardhan, Reynold Bowen, MD  sildenafil (REVATIO) 20 MG tablet Take 100 mg by mouth daily as needed.     [provider]  traMADol (ULTRAM) 50 MG tablet Take 1-2 tablets (50-100 mg total) by mouth every 6 (six) hours as needed for moderate pain. 10/18/17   [name], MD  triamcinolone cream (KENALOG) 0.1 % Apply 1 application topically 2 (two) times daily as needed (skin irritation).    [provider]  TRULICITY [id number] SOPN Inject 0.75 mg into the skin once a week. 02/26/19   [provider]    Allergies    Ciprofloxacin, Escitalopram oxalate, Quinapril hcl, and Sulfa antibiotics  Review of Systems   Review of Systems  All other systems reviewed and are negative. Ten systems reviewed and are negative for acute change, except as noted in the HPI.   Physical Exam Updated Vital Signs BP (!) 150/90 (BP Location: Right Arm)   Pulse 67   Temp 98.3 F (36.8 C) (Oral)   Resp 18   SpO2 99%   Physical Exam Vitals and nursing note reviewed.  Constitutional:      General: He is not in acute distress.    Appearance: Normal appearance. He  is not ill-appearing, toxic-appearing or diaphoretic.  HENT:     Head: Normocephalic.     Comments: Small abrasion noted to the left parietal scalp.  No active bleeding.  No hematoma.    Right Ear: External ear normal.     Left Ear: External ear normal.     Nose: Nose normal.     Mouth/Throat:     Mouth: Mucous membranes are moist.     Pharynx: Oropharynx is clear. No oropharyngeal exudate or posterior oropharyngeal erythema.  Eyes:     General: No scleral icterus.       Right eye: No discharge.        Left eye: No discharge.     Extraocular Movements: Extraocular movements intact.     Conjunctiva/sclera: Conjunctivae normal.     Pupils: Pupils are equal, round, and reactive to light.  Neck:     Comments: No midline C, T, L-spine tenderness. Cardiovascular:     Rate and Rhythm: Normal rate and regular rhythm.     Pulses: Normal pulses.     Heart sounds: Normal heart sounds. No murmur. No friction rub. No gallop.   Pulmonary:     Effort: Pulmonary effort is normal. No respiratory distress.     Breath sounds: Normal breath sounds. No stridor. No wheezing, rhonchi or rales.  Chest:     Chest wall: No tenderness.  Abdominal:     General: Abdomen is flat.     Palpations: Abdomen is soft.     Tenderness: There is no abdominal tenderness.     Comments: Small region of ecchymosis noted to the right abdomen.  Musculoskeletal:        General: Swelling, tenderness and signs of injury present. No deformity. Normal range of motion.     Cervical back: Normal range of motion and neck supple. No tenderness.     Right lower leg: Edema present.     Left lower leg: No edema.     Comments: Mild diffuse pain noted along the left wrist.  No visible erythema or ecchymosis.  Full range of motion of the left wrist.  Patient able to flex and extend all the fingers of the left hand.  2+ radial pulses noted bilaterally.  Distal sensation intact.  Diffuse pain and swelling noted to the right knee.  Unable  to assess range of motion as well as strength in the right lower extremity secondary to pain.  Distal sensation intact.  Palpable pedal pulses.  Mild diffuse pain noted in the lateral aspect of the right foot.  Skin:    General: Skin is warm and dry.  Neurological:     General: No focal deficit present.     Mental Status: He is alert and oriented to person, place, and time.     Comments: Patient is oriented to person, place, time.  He is phonating clearly and coherently.  He phonates in complete sentences.  Finger-to-nose intact bilaterally with no visible signs of dysmetria.  Negative pronator drift.  Distal sensation intact.  Grip strength 5 out of 5 and equal bilaterally.  No facial droop.  Extraocular movements intact.  Psychiatric:        Mood and Affect: Mood normal.        Behavior: Behavior normal.    ED Results / Procedures / Treatments   Labs (all labs ordered are listed, but only abnormal results are displayed) Labs Reviewed  BASIC METABOLIC PANEL - Abnormal; Notable for the following components:  Result Value   Glucose, Bld 180 (*)    Creatinine, Ser 1.35 (*)    GFR calc non Af Amer 53 (*)    All other components within normal limits  CBC WITH DIFFERENTIAL/PLATELET - Abnormal; Notable for the following components:   Platelets 136 (*)    All other components within normal limits  SARS CORONAVIRUS 2 BY RT PCR [facility] ORDER, [place] [facility])    EKG EKG Interpretation  Date/Time:  Sunday Oct 18 2019 09:09:48 EDT Ventricular Rate:  65 PR Interval:    QRS Duration: 108 QT Interval:  534 QTC Calculation: 556 R Axis:   29 Text Interpretation: Sinus rhythm Short PR interval Probable anterior infarct, age indeterminate Prolonged QT interval Baseline wander in lead(s) V3 V5 Confirmed by Quintella Reichert [phone]) on 10/18/2019 9:11:21 AM   Radiology DG Wrist Complete Left  Result Date: 10/18/2019 CLINICAL DATA:  Pain after trauma EXAM: LEFT WRIST  - COMPLETE 3+ VIEW COMPARISON:  None. FINDINGS: There is a faint lucency through the radial metaphysis with a cortical break seen on the lateral view. These findings are consistent with a subtle radial metaphysis fracture. No other fractures are identified. IMPRESSION: Subtle nondisplaced fracture through the radial metaphysis. Electronically Signed   By: Dorise Bullion III M.D   On: 10/18/2019 10:12   CT Head Wo Contrast  Result Date: 10/18/2019 CLINICAL DATA:  69 year old male with headache and neck pain following fall today. Initial encounter. EXAM: CT HEAD WITHOUT CONTRAST CT CERVICAL SPINE WITHOUT CONTRAST TECHNIQUE: Multidetector CT imaging of the head and cervical spine was performed following the standard protocol without intravenous contrast. Multiplanar CT image reconstructions of the cervical spine were also generated. COMPARISON:  None. FINDINGS: CT HEAD FINDINGS Brain: No evidence of acute infarction, hemorrhage, hydrocephalus, extra-axial collection or mass lesion/mass effect. Chronic small-vessel white matter ischemic changes are noted. Vascular: Carotid and vertebrobasilar atherosclerotic calcifications are noted. Skull: Normal. Negative for fracture or focal lesion. Sinuses/Orbits: No acute finding. Other: None CT CERVICAL SPINE FINDINGS Alignment: Normal. Skull base and vertebrae: No acute fracture. No primary bone lesion or focal pathologic process. Soft tissues and spinal canal: No prevertebral fluid or swelling. No visible canal hematoma. Disc levels: Moderate degenerative disc disease/spondylosis from C4-C7 identified contributing to mild central spinal and bony foraminal narrowing at these levels. Upper chest: No acute abnormality. Other: A 0.8 x 1 cm RIGHT parotid mass is identified (image 17: Series 4). IMPRESSION: 1. No evidence of acute intracranial abnormality. Chronic small-vessel white matter ischemic changes. 2. No static evidence of acute injury to the cervical spine.  Degenerative changes as described. 3. 0.8 x 1 cm RIGHT parotid mass - nonspecific but consider ENT follow-up/consultation. Electronically Signed   By: Margarette Canada M.D.   On: 10/18/2019 10:22   CT Cervical Spine Wo Contrast  Result Date: 10/18/2019 CLINICAL DATA:  69 year old male with headache and neck pain following fall today. Initial encounter. EXAM: CT HEAD WITHOUT CONTRAST CT CERVICAL SPINE WITHOUT CONTRAST TECHNIQUE: Multidetector CT imaging of the head and cervical spine was performed following the standard protocol without intravenous contrast. Multiplanar CT image reconstructions of the cervical spine were also generated. COMPARISON:  None. FINDINGS: CT HEAD FINDINGS Brain: No evidence of acute infarction, hemorrhage, hydrocephalus, extra-axial collection or mass lesion/mass effect. Chronic small-vessel white matter ischemic changes are noted. Vascular: Carotid and vertebrobasilar atherosclerotic calcifications are noted. Skull: Normal. Negative for fracture or focal lesion. Sinuses/Orbits: No acute finding. Other: None CT CERVICAL SPINE FINDINGS Alignment:  Normal. Skull base and vertebrae: No acute fracture. No primary bone lesion or focal pathologic process. Soft tissues and spinal canal: No prevertebral fluid or swelling. No visible canal hematoma. Disc levels: Moderate degenerative disc disease/spondylosis from C4-C7 identified contributing to mild central spinal and bony foraminal narrowing at these levels. Upper chest: No acute abnormality. Other: A 0.8 x 1 cm RIGHT parotid mass is identified (image 17: Series 4). IMPRESSION: 1. No evidence of acute intracranial abnormality. Chronic small-vessel white matter ischemic changes. 2. No static evidence of acute injury to the cervical spine. Degenerative changes as described. 3. 0.8 x 1 cm RIGHT parotid mass - nonspecific but consider ENT follow-up/consultation. Electronically Signed   By: Margarette Canada M.D.   On: 10/18/2019 10:22   DG Knee Complete 4  Views Right  Result Date: 10/18/2019 CLINICAL DATA:  Pain after fall EXAM: RIGHT KNEE - COMPLETE 4+ VIEW COMPARISON:  None. FINDINGS: The patella is relatively low lying. There is a suprapatellar effusion. There is also significant edema and soft tissue swelling superior to the patella. No other fractures. IMPRESSION: 1. A low lying patella with significant soft tissue swelling superior to the patella raises the possibility of a quadriceps tendon rupture. Recommend clinical correlation. MRI could further assess if clinically ambiguous. 2. Suprapatellar joint effusion. 3. The visualized fibula, tibia, and femur are intact. No patellar fracture is identified. Electronically Signed   By: Dorise Bullion III M.D   On: 10/18/2019 10:16   DG Foot Complete Right  Result Date: 10/18/2019 CLINICAL DATA:  Pain after trauma EXAM: RIGHT FOOT COMPLETE - 3+ VIEW COMPARISON:  None. FINDINGS: There is a fracture through the base of the fifth metatarsal. IMPRESSION: There is a fracture through the base of the fifth metatarsal. No other abnormalities. Electronically Signed   By: Dorise Bullion III M.D   On: 10/18/2019 10:17    Procedures Procedures (including critical care time)  Medications Ordered in ED Medications  HYDROmorphone (DILAUDID) injection 0.5 mg (0.5 mg Intravenous Given 10/18/19 0922)  HYDROmorphone (DILAUDID) injection 0.5 mg (0.5 mg Intravenous Given 10/18/19 1333)    ED Course  I have reviewed the triage vital signs and the nursing notes.  Pertinent labs & imaging results that were available during my care of the patient were reviewed by me and considered in my medical decision making (see chart for details).    MDM Rules/Calculators/A&P                      Patient is a 69 year old male that presents with signs and symptoms as noted above after a mechanical fall this morning.  Patient is anticoagulated with Xarelto and due to this obtained CT scan of the head and neck as well as x-rays  of the right foot, knee, left wrist.  CT scan shows no evidence of acute intracranial abnormality.  No acute injury to the cervical spine.  There is a 0.8 x 1 cm right parotid mass.  Patient notes he has an ENT already.  I informed him of this mass and he plans on following up with them.  X-ray of the right foot shows a fracture through the base of the fifth metatarsal.  He also has a low-lying patella on the right side with significant soft tissue swelling superior to the patella which raises the possibility of a quadriceps tendon rupture.  Lastly, there is a subtle nondisplaced fracture through the radial metaphysis.  I discussed this patient with Dr. Alvan Dame at [facility].  He recommended we place him in a cam boot, knee immobilizer, splint of the left wrist.  There is some concern that because patient lives alone, it would not be safe to discharge him home.  Dr. Alvan Dame is going to allow [place] to admit him to his service.  I discussed this with the patient.  He understands that he is likely going to have surgery to his right quadriceps tendon in 2 to 3 days due to his anticoagulated state.  His questions were answered and he was amenable with this plan.  His vital signs are stable.  Final Clinical Impression(s) / ED Diagnoses Final diagnoses:  Fall, initial encounter  Closed nondisplaced fracture of fifth metatarsal bone of right foot, initial encounter  Closed fracture of distal end of left radius, unspecified fracture morphology, initial encounter  Effusion of right knee   Rx / DC Orders ED Discharge Orders    None       Rayna Sexton, PA-C 10/18/19 1357    Quintella Reichert, MD 10/19/19 0710

## 2019-10-18 NOTE — Care Management (Signed)
COVID swabbed the patient left nares  [place] DNP,MSN,RN

## 2019-10-18 NOTE — Progress Notes (Addendum)
Pt arrived to unit  fromED accompanied by ED staff. Alert/oriented in no acute distress.situated to room/equipments Welcome guide/menu provided with no complaints. Questions and concerns were answered satisfactorily. Pt has been informed that facility is not responsible for any losses/damages to any personal valuables/belongings. Or has to options to hand it to security for safe keepin. 3 side rails up and call bell/phone within reach and all wheels locked.

## 2019-10-18 NOTE — ED Triage Notes (Signed)
Pt brought to ED via EMS from home for mechanical fall sustained by slipping in cat vomit. No LOC, no obvious deformities per EMS. Pt unable to fully straighten right leg, pain reported in leg from knee up. Pain also reported pain in left wrist with swelling, pt states he caught himself on wrist during fall. Pt on eliquis. 100 mcg fentanyl given en route by EMS. Pt A&O x4, VSS.

## 2019-10-18 NOTE — H&P (Signed)
LATERRANCE REISTER is an 69 y.o. male.    Chief Complaint:  Fall with left wrist pain, right knee pain and right foot pain   HPI: Pt is a 69 y.o. male complaining of left wrist, right knee and right foot pain.  RAYNARD BIEGER is a 69 y.o. male who presents to the Emergency Department complaining of a fall.  Patient was walking down his hall this morning and slipped in "cat vomit".  He states he fell to his left side and landed on his left wrist.  He is right-hand dominant.  He additionally notes striking his right knee but is unsure how.  He reports associated mild pain in the left wrist and exquisite pain along the right knee.  There is some moderate swelling circumferentially around the right knee.  Patient does not remember striking his head but there is a small abrasion with no bleeding to his left parietal scalp.  No hematoma.  Patient is anticoagulated.  No fevers, chills, chest pain, shortness of breath, abdominal pain, nausea, vomiting, diarrhea, syncope.  PCP:  [name], MD  D/C Plans: To be determined following appropriate treatment plan  PMH: Past Medical History:  Diagnosis Date  . Abnormal liver function test 05/23/2011  . ALLERGIC RHINITIS   . ANXIETY   . Aortic atherosclerosis ([place])   . Arthritis   . Ascending aorta dilation (H[facility]) 07/24/2018  . Bilateral renal cysts   . Cervical spondylolysis    Moderate  . CHF (congestive heart failure) ([place]) 07/24/2018  . Chronic kidney disease    CKD stage 3 per office visit note 08/08/17 on chart   . COLONIC POLYPS, HX OF   . Coronary artery disease   . DEPRESSION   . DIABETES MELLITUS, TYPE II   . Diverticulitis   . Epidermal cyst   . Fatty liver   . GERD   . GOUT   . History of inguinal hernia   . HOH (hard of hearing)    elft ear  . HYPERLIPIDEMIA   . HYPERTENSION   . IBS   . 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 Ischemic  cardiomyopathy 07/24/2018  . Left renal mass 05/23/2011  . Lumbar radicular pain 06/03/2011  . Macular degeneration    right eye  . Multinodular goiter   . Pancreatic pseudocyst    Stable  . Peritonsillar abscess   . Pulmonary nodule    Right upper lobe  . Radial neck fracture   . Right upper lobe pulmonary nodule 07/24/2018  . Shortness of breath    occasional shortness of breath  with exertion  . Thyroid disease   . Thyroid nodule    Bilateral  . Vertigo     PSH: Past Surgical History:  Procedure Laterality Date  . APPENDECTOMY  02/12/11  . COLON SURGERY  08/11/10   sigmoid colectomy  . COLON SURGERY  02/12/11   colostomy takedown  . COLONOSCOPY    . CORONARY ARTERY BYPASS GRAFT N/A 04/01/2018   Procedure: CORONARY ARTERY BYPASS GRAFTING (CABG) x Three, using left internal mammary artery and right leg greater saphenous vein harvested endoscopically;  Surgeon: Grace Isaac, MD;  Location: [facility];  Service: Open Heart Surgery;  Laterality: N/A;  . CYST REMOVAL TRUNK Right 02/20/2018   Procedure: epidermal cyst excision right lower back;  Surgeon: [name], MD;  Location: [facility];  Service: General;  Laterality: Right;  . HERNIA REPAIR    .  INSERTION OF MESH N/A 08/20/2012   Procedure: INSERTION OF MESH;  Surgeon: [name], MD;  Location: [facility];  Service: General;  Laterality: N/A;  . LEFT HEART CATH AND CORONARY ANGIOGRAPHY N/A 01/14/2018   Procedure: LEFT HEART CATH AND CORONARY ANGIOGRAPHY;  Surgeon: [name], MD;  Location: [facility];  Service: Cardiovascular;  Laterality: N/A;  . LYSIS OF ADHESION  08/20/2012   Procedure: LYSIS OF ADHESION;  Surgeon: [name], MD;  Location: [facility];  Service: General;;  . SEPTOPLASTY  age 56  . TEE WITHOUT CARDIOVERSION N/A 04/01/2018   Procedure: TRANSESOPHAGEAL ECHOCARDIOGRAM (TEE);  Surgeon: Grace Isaac, MD;  Location: [facility];  Service: Open Heart Surgery;  Laterality: N/A;  . THYROIDECTOMY N/A  10/17/2017   Procedure: TOTAL THYROIDECTOMY;  Surgeon: [name], MD;  Location: [facility];  Service: General;  Laterality: N/A;  . TONSILLECTOMY Right 08/01/2016   Procedure: INCISION AND DRAINAGE RIGHT PERI-TONSILLAR ABSCESS;  Surgeon: [name], MD;  Location: [facility];  Service: ENT;  Laterality: Right;  . ULTRASOUND GUIDANCE FOR VASCULAR ACCESS  01/14/2018   Procedure: Ultrasound Guidance For Vascular Access;  Surgeon: [name], MD;  Location: [facility];  Service: Cardiovascular;;  . VENTRAL HERNIA REPAIR  08/20/2012   Procedure: HERNIA REPAIR VENTRAL ADULT;  Surgeon: [name], MD;  Location[facility];  Service: General;;    Social History:  reports that he has never smoked. He has never used smokeless tobacco. He reports current alcohol use. He reports that he does not use drugs.  Allergies:  Allergies  Allergen Reactions  . Ciprofloxacin Rash  . Escitalopram Oxalate Itching  . Quinapril Hcl Rash  . Sulfa Antibiotics Swelling    Swelling in the ankles    Medications: Medications Prior to Admission  Medication Sig Dispense Refill  . acetaminophen (TYLENOL) 325 MG tablet Take 2 tablets (650 mg total) by mouth every 6 (six) hours as needed for mild pain (or Fever >/= 101). 20 tablet 0  . allopurinol (ZYLOPRIM) 300 MG tablet TAKE 1 TABLET BY MOUTH EVERY DAY (Patient taking differently: Take 300 mg by mouth daily. ) 90 tablet 0  . ALPRAZolam (XANAX) 0.5 MG tablet Take 1 tablet (0.5 mg total) by mouth daily as needed for sleep. 1/2 - 1 by mouth once daily as needed (Patient taking differently: Take 0.25-0.5 mg by mouth at bedtime as needed for sleep. ) 90 tablet 2  . aspirin EC 81 MG tablet Take 81 mg by mouth every other day.     [name] aspirin-acetaminophen-caffeine (EXCEDRIN MIGRAINE) 250-250-65 MG tablet Take 2 tablets by mouth every 6 (six) hours as needed for headache.    [name] atorvastatin (LIPITOR) 10 MG tablet Take 10 mg by mouth at bedtime.    . calcium  carbonate (TUMS) 500 MG chewable tablet Chew 2 tablets (400 mg of elemental calcium total) by mouth 2 (two) times daily. (Patient taking differently: Chew 2 tablets by mouth 2 (two) times daily as needed for indigestion. ) 90 tablet 1  . carvedilol (COREG) 6.25 MG tablet Take 1 tablet (6.25 mg total) by mouth 2 (two) times daily. 180 tablet 3  . Cholecalciferol (VITAMIN D-3) 5000 units TABS Take 5,000 Units by mouth at bedtime.    . diphenhydrAMINE (BENADRYL) 25 MG tablet Take 25 mg by mouth at bedtime as needed for sleep.    . furosemide (LASIX) 40 MG tablet Take 40 mg by mouth every  other day.     [name] guaiFENesin (MUCINEX) 600 MG 12 hr tablet Take 1,200 mg by mouth 2 (two) times daily as needed for cough or to loosen phlegm.    [name] ibuprofen (ADVIL) 200 MG tablet Take 200 mg by mouth every 6 (six) hours as needed for moderate pain.    [name] insulin glargine (LANTUS) 100 unit/mL SOPN Inject 44 Units into the skin at bedtime.    [name] levothyroxine (SYNTHROID) 50 MCG tablet Take 125 mcg by mouth daily. Taking 2.5 tablets daily    . Multiple Vitamins-Minerals (PRESERVISION AREDS 2 PO) Take 1 tablet by mouth 2 (two) times daily.    [name] omega-3 acid ethyl esters (LOVAZA) 1 g capsule Take 2 g by mouth 2 (two) times daily.     . pantoprazole (PROTONIX) 40 MG tablet Take 40 mg by mouth 2 (two) times daily.    . rivaroxaban (XARELTO) 2.5 MG TABS tablet Take 1 tablet (2.5 mg total) by mouth 2 (two) times daily. 180 tablet 03  . sacubitril-valsartan (ENTRESTO) 49-51 MG Take 1 tablet by mouth 2 (two) times daily. 180 tablet 1  . sildenafil (REVATIO) 20 MG tablet Take 100 mg by mouth daily as needed.     . traMADol (ULTRAM) 50 MG tablet Take 1-2 tablets (50-100 mg total) by mouth every 6 (six) hours as needed for moderate pain. 24 tablet 0  . triamcinolone cream (KENALOG) 0.1 % Apply 1 application topically 2 (two) times daily as needed (skin irritation).    . TRULICITY [id number] SOPN Inject 0.75 mg into the skin once a  week. Monday      Results for orders placed or performed during the hospital encounter of 10/18/19 (from the past 48 hour(s))  Basic metabolic panel     Status: Abnormal   Collection Time: 10/18/19 10:19 AM  Result Value Ref Range   Sodium 142 135 - 145 mmol/L   Potassium 3.7 3.5 - 5.1 mmol/L   Chloride 107 98 - 111 mmol/L   CO2 23 22 - 32 mmol/L   Glucose, Bld 180 (H) 70 - 99 mg/dL    Comment: Glucose reference range applies only to samples taken after fasting for at least 8 hours.   BUN 21 8 - 23 mg/dL   Creatinine, Ser 1.35 (H) 0.61 - 1.24 mg/dL   Calcium 9.4 8.9 - 10.3 mg/dL   GFR calc non Af Amer 53 (L) >60 mL/min   GFR calc Af Amer >60 >60 mL/min   Anion gap 12 5 - 15    Comment: Performed at [facility] [address] [place], [place] [address]  CBC with Differential     Status: Abnormal   Collection Time: 10/18/19 10:19 AM  Result Value Ref Range   WBC 8.8 4.0 - 10.5 K/uL   RBC 5.18 4.22 - 5.81 MIL/uL   Hemoglobin 16.6 13.0 - 17.0 g/dL   HCT 49.6 39.0 - 52.0 %   MCV 95.8 80.0 - 100.0 fL   MCH 32.0 26.0 - 34.0 pg   MCHC 33.5 30.0 - 36.0 g/dL   RDW 14.4 11.5 - 15.5 %   Platelets 136 (L) 150 - 400 K/uL    Comment: REPEATED TO VERIFY   nRBC 0.0 0.0 - 0.2 %   Neutrophils Relative % 68 %   Neutro Abs 6.0 1.7 - 7.7 K/uL   Lymphocytes Relative 22 %   Lymphs Abs 1.9 0.7 - 4.0 K/uL   Monocytes Relative 6 %  Monocytes Absolute 0.5 0.1 - 1.0 K/uL   Eosinophils Relative 3 %   Eosinophils Absolute 0.3 0.0 - 0.5 K/uL   Basophils Relative 1 %   Basophils Absolute 0.1 0.0 - 0.1 K/uL   Immature Granulocytes 0 %   Abs Immature Granulocytes 0.03 0.00 - 0.07 K/uL    Comment: Performed at [facility], [place] [address], [place], [place] [address]  SARS Coronavirus 2 by RT PCR (hospital order, performed in [facility] hospital lab) Nasopharyngeal Nasopharyngeal Swab     Status: None   Collection Time: 10/18/19  1:55 PM   Specimen: Nasopharyngeal Swab  Result Value Ref  Range   SARS Coronavirus 2 NEGATIVE NEGATIVE    Comment: (NOTE) SARS-CoV-2 target nucleic acids are NOT DETECTED. The SARS-CoV-2 RNA is generally detectable in upper and lower respiratory specimens during the acute phase of infection. The lowest concentration of SARS-CoV-2 viral copies this assay can detect is 250 copies / mL. A negative result does not preclude SARS-CoV-2 infection and should not be used as the sole basis for treatment or other patient management decisions.  A negative result may occur with improper specimen collection / handling, submission of specimen other than nasopharyngeal swab, presence of viral mutation(s) within the areas targeted by this assay, and inadequate number of viral copies (<250 copies / mL). A negative result must be combined with clinical observations, patient history, and epidemiological information. Fact Sheet for Patients:   StrictlyIdeas.no Fact Sheet for Healthcare Providers: BankingDealers.co.za This test is not yet approved or cleared  by the [place] FDA and has been authorized for detection and/or diagnosis of SARS-CoV-2 by FDA under an Emergency Use Authorization (EUA).  This EUA will remain in effect (meaning this test can be used) for the duration of the COVID-19 declaration under Section 564(b)(1) of the Act, 21 U.S.C. section 360bbb-3(b)(1), unless the authorization is terminated or revoked sooner. Performed at [facility], [place] [address] [place], [place] [address]    DG Wrist Complete Left  Result Date: 10/18/2019 CLINICAL DATA:  Pain after trauma EXAM: LEFT WRIST - COMPLETE 3+ VIEW COMPARISON:  None. FINDINGS: There is a faint lucency through the radial metaphysis with a cortical break seen on the lateral view. These findings are consistent with a subtle radial metaphysis fracture. No other fractures are identified. IMPRESSION: Subtle nondisplaced fracture through the  radial metaphysis. Electronically Signed   By: Dorise Bullion III M.D   On: 10/18/2019 10:12   CT Head Wo Contrast  Result Date: 10/18/2019 CLINICAL DATA:  69 year old male with headache and neck pain following fall today. Initial encounter. EXAM: CT HEAD WITHOUT CONTRAST CT CERVICAL SPINE WITHOUT CONTRAST TECHNIQUE: Multidetector CT imaging of the head and cervical spine was performed following the standard protocol without intravenous contrast. Multiplanar CT image reconstructions of the cervical spine were also generated. COMPARISON:  None. FINDINGS: CT HEAD FINDINGS Brain: No evidence of acute infarction, hemorrhage, hydrocephalus, extra-axial collection or mass lesion/mass effect. Chronic small-vessel white matter ischemic changes are noted. Vascular: Carotid and vertebrobasilar atherosclerotic calcifications are noted. Skull: Normal. Negative for fracture or focal lesion. Sinuses/Orbits: No acute finding. Other: None CT CERVICAL SPINE FINDINGS Alignment: Normal. Skull base and vertebrae: No acute fracture. No primary bone lesion or focal pathologic process. Soft tissues and spinal canal: No prevertebral fluid or swelling. No visible canal hematoma. Disc levels: Moderate degenerative disc disease/spondylosis from C4-C7 identified contributing to mild central spinal and bony foraminal narrowing at these levels. Upper chest: No  acute abnormality. Other: A 0.8 x 1 cm RIGHT parotid mass is identified (image 17: Series 4). IMPRESSION: 1. No evidence of acute intracranial abnormality. Chronic small-vessel white matter ischemic changes. 2. No static evidence of acute injury to the cervical spine. Degenerative changes as described. 3. 0.8 x 1 cm RIGHT parotid mass - nonspecific but consider ENT follow-up/consultation. Electronically Signed   By: Margarette Canada M.D.   On: 10/18/2019 10:22   CT Cervical Spine Wo Contrast  Result Date: 10/18/2019 CLINICAL DATA:  69 year old male with headache and neck pain following  fall today. Initial encounter. EXAM: CT HEAD WITHOUT CONTRAST CT CERVICAL SPINE WITHOUT CONTRAST TECHNIQUE: Multidetector CT imaging of the head and cervical spine was performed following the standard protocol without intravenous contrast. Multiplanar CT image reconstructions of the cervical spine were also generated. COMPARISON:  None. FINDINGS: CT HEAD FINDINGS Brain: No evidence of acute infarction, hemorrhage, hydrocephalus, extra-axial collection or mass lesion/mass effect. Chronic small-vessel white matter ischemic changes are noted. Vascular: Carotid and vertebrobasilar atherosclerotic calcifications are noted. Skull: Normal. Negative for fracture or focal lesion. Sinuses/Orbits: No acute finding. Other: None CT CERVICAL SPINE FINDINGS Alignment: Normal. Skull base and vertebrae: No acute fracture. No primary bone lesion or focal pathologic process. Soft tissues and spinal canal: No prevertebral fluid or swelling. No visible canal hematoma. Disc levels: Moderate degenerative disc disease/spondylosis from C4-C7 identified contributing to mild central spinal and bony foraminal narrowing at these levels. Upper chest: No acute abnormality. Other: A 0.8 x 1 cm RIGHT parotid mass is identified (image 17: Series 4). IMPRESSION: 1. No evidence of acute intracranial abnormality. Chronic small-vessel white matter ischemic changes. 2. No static evidence of acute injury to the cervical spine. Degenerative changes as described. 3. 0.8 x 1 cm RIGHT parotid mass - nonspecific but consider ENT follow-up/consultation. Electronically Signed   By: Margarette Canada M.D.   On: 10/18/2019 10:22   DG Knee Complete 4 Views Right  Result Date: 10/18/2019 CLINICAL DATA:  Pain after fall EXAM: RIGHT KNEE - COMPLETE 4+ VIEW COMPARISON:  None. FINDINGS: The patella is relatively low lying. There is a suprapatellar effusion. There is also significant edema and soft tissue swelling superior to the patella. No other fractures. IMPRESSION: 1.  A low lying patella with significant soft tissue swelling superior to the patella raises the possibility of a quadriceps tendon rupture. Recommend clinical correlation. MRI could further assess if clinically ambiguous. 2. Suprapatellar joint effusion. 3. The visualized fibula, tibia, and femur are intact. No patellar fracture is identified. Electronically Signed   By: Dorise Bullion III M.D   On: 10/18/2019 10:16   DG Foot Complete Right  Result Date: 10/18/2019 CLINICAL DATA:  Pain after trauma EXAM: RIGHT FOOT COMPLETE - 3+ VIEW COMPARISON:  None. FINDINGS: There is a fracture through the base of the fifth metatarsal. IMPRESSION: There is a fracture through the base of the fifth metatarsal. No other abnormalities. Electronically Signed   By: Dorise Bullion III M.D   On: 10/18/2019 10:17    ROS: Review of Systems -  All other systems reviewed and are negative. Ten systems reviewed and are negative for acute change, except as noted in the HPI.  Physican Exam: Blood pressure (!) 122/94, pulse 81, temperature 98 F (36.7 C), resp. rate 18, height 5' 7.5" (1.715 m), SpO2 97 %.  Physical Exam Vitals and nursing note reviewed.  Constitutional:      General: He is not in acute distress.    Appearance: Normal appearance.  He is not ill-appearing, toxic-appearing or diaphoretic.  HENT:     Head: Normocephalic.     Comments: Small abrasion noted to the left parietal scalp.  No active bleeding.  No hematoma.    Right Ear: External ear normal.     Left Ear: External ear normal.     Nose: Nose normal.     Mouth/Throat:     Mouth: Mucous membranes are moist.     Pharynx: Oropharynx is clear. No oropharyngeal exudate or posterior oropharyngeal erythema.  Eyes:     General: No scleral icterus.       Right eye: No discharge.        Left eye: No discharge.     Extraocular Movements: Extraocular movements intact.     Conjunctiva/sclera: Conjunctivae normal.     Pupils: Pupils are equal, round, and  reactive to light.  Neck:     Comments: No midline C, T, L-spine tenderness. Cardiovascular:     Rate and Rhythm: Normal rate and regular rhythm.     Pulses: Normal pulses.     Heart sounds: Normal heart sounds. No murmur. No friction rub. No gallop.   Pulmonary:     Effort: Pulmonary effort is normal. No respiratory distress.     Breath sounds: Normal breath sounds. No stridor. No wheezing, rhonchi or rales.  Chest:     Chest wall: No tenderness.  Abdominal:     General: Abdomen is flat.     Palpations: Abdomen is soft.     Tenderness: There is no abdominal tenderness.     Comments: Small region of ecchymosis noted to the right abdomen.  Musculoskeletal:        General: Swelling, tenderness and signs of injury present. No deformity. Normal range of motion.     Cervical back: Normal range of motion and neck supple. No tenderness.     Right lower leg: Edema present.     Left lower leg: No edema.     Comments: Mild diffuse pain noted along the left wrist.  No visible erythema or ecchymosis.  Full range of motion of the left wrist.  Patient able to flex and extend all the fingers of the left hand.  2+ radial pulses noted bilaterally.  Distal sensation intact.  Diffuse pain and swelling noted to the right knee.  Unable to assess range of motion as well as strength in the right lower extremity secondary to pain.  Distal sensation intact.  Palpable pedal pulses.  Mild diffuse pain noted in the lateral aspect of the right foot.  Skin:    General: Skin is warm and dry.  Neurological:     General: No focal deficit present.     Mental Status: He is alert and oriented to person, place, and time.     Comments: Patient is oriented to person, place, time.  He is phonating clearly and coherently.  He phonates in complete sentences.  Finger-to-nose intact bilaterally with no visible signs of dysmetria.  Negative pronator drift.  Distal sensation intact.  Grip strength 5 out of 5 and equal  bilaterally.  No facial droop.  Extraocular movements intact.  Psychiatric:        Mood and Affect: Mood normal.        Behavior: Behavior normal.   Assessment/Plan Assessment:  1. Left distal radius fracture - have Ortho tech place in volar splint, NWB LUE until healed 2. Right quad tendon rupture - will need to have this repaired.  Given use  of Xarelto I will plan on addressing this Tuesday 3. Right fifth metatarsal base fracture - likely to be treated non-operatively with Cam Walker boot, WBAT   Admit for now to allow to be off Xarelto for couple of days prior to OR Urgent orders to follow tomorrow for surgery Tuesday   Pietro Cassis. Alvan Dame, MD  10/18/2019, 6:22 PM

## 2019-10-18 NOTE — Progress Notes (Signed)
Orthopedic Tech Progress Note Patient Details:  RIPKEN MONTERROSA Apr 16, 1951 [id number]  Ortho Devices Type of Ortho Device: CAM walker, Knee Immobilizer, Velcro wrist splint Ortho Device/Splint Location: RLE,LUE Ortho Device/Splint Interventions: Ordered, Application   Post Interventions Patient Tolerated: Well Instructions Provided: Care of device, Adjustment of device   Charlynn Salih 10/18/2019, 5:57 PM

## 2019-10-19 LAB — CBC
HCT: 44.1 % (ref 39.0–52.0)
Hemoglobin: 14.9 g/dL (ref 13.0–17.0)
MCH: 32 pg (ref 26.0–34.0)
MCHC: 33.8 g/dL (ref 30.0–36.0)
MCV: 94.6 fL (ref 80.0–100.0)
Platelets: UNDETERMINED 10*3/uL (ref 150–400)
RBC: 4.66 MIL/uL (ref 4.22–5.81)
RDW: 14.6 % (ref 11.5–15.5)
WBC: 8.5 10*3/uL (ref 4.0–10.5)
nRBC: 0 % (ref 0.0–0.2)

## 2019-10-19 LAB — BASIC METABOLIC PANEL
Anion gap: 10 (ref 5–15)
BUN: 28 mg/dL — ABNORMAL HIGH (ref 8–23)
CO2: 22 mmol/L (ref 22–32)
Calcium: 8.6 mg/dL — ABNORMAL LOW (ref 8.9–10.3)
Chloride: 109 mmol/L (ref 98–111)
Creatinine, Ser: 1.59 mg/dL — ABNORMAL HIGH (ref 0.61–1.24)
GFR calc Af Amer: 51 mL/min — ABNORMAL LOW (ref >60)
GFR calc non Af Amer: 44 mL/min — ABNORMAL LOW (ref >60)
Glucose, Bld: 137 mg/dL — ABNORMAL HIGH (ref 70–99)
Potassium: 3.6 mmol/L (ref 3.5–5.1)
Sodium: 141 mmol/L (ref 135–145)

## 2019-10-19 LAB — SURGICAL PCR SCREEN
MRSA, PCR: NEGATIVE
Staphylococcus aureus: NEGATIVE

## 2019-10-19 LAB — GLUCOSE, CAPILLARY: Glucose-Capillary: 115 mg/dL — ABNORMAL HIGH (ref 70–99)

## 2019-10-19 MED ORDER — CHLORHEXIDINE GLUCONATE 4 % EX LIQD
60.0000 mL | Freq: Once | CUTANEOUS | Status: AC
  Administered 2019-10-20: 4 via TOPICAL

## 2019-10-19 MED ORDER — CEFAZOLIN SODIUM-DEXTROSE 2-4 GM/100ML-% IV SOLN
2.0000 g | INTRAVENOUS | Status: AC
  Administered 2019-10-20: 2 g via INTRAVENOUS
  Filled 2019-10-19: qty 100

## 2019-10-19 MED ORDER — SODIUM CHLORIDE 0.9 % IV SOLN: INTRAVENOUS | Status: DC

## 2019-10-19 MED ORDER — CEFAZOLIN SODIUM-DEXTROSE 2-4 GM/100ML-% IV SOLN: 2.0000 g | INTRAVENOUS | Status: DC

## 2019-10-19 MED ORDER — POVIDONE-IODINE 10 % EX SWAB: 2.0000 "application " | Freq: Once | CUTANEOUS | Status: DC

## 2019-10-19 NOTE — Progress Notes (Signed)
Subjective: 69 y/o male admitted yesterday after a fall at home.  He has a left distal radius fracture and a right 5th MT base fracture as well as a quad tendon rupture.  He is on Xarelto at home.  Last dose Saturday night.  He c/o some soreness in the right knee but says it feels a bit better.  On the OR schedule for tomorrow.  He lives alone.   Objective: Vital signs in last 24 hours: Temp:  [97.7 F (36.5 C)-98.6 F (37 C)] 98.6 F (37 C) (05/31 0756) Pulse Rate:  [67-92] 75 (05/31 0756) Resp:  [12-19] 16 (05/31 0756) BP: (105-145)/(70-96) 115/75 (05/31 0756) SpO2:  [93 %-98 %] 93 % (05/31 0756) Weight:  [97.1 kg] 97.1 kg (05/31 0624)  Intake/Output from previous day: 05/30 0701 - 05/31 0700 In: 649.7 [P.O.:240; I.V.:409.7] Out: -  Intake/Output this shift: No intake/output data recorded.  Recent Labs    10/18/19 1019 10/19/19 0343  HGB 16.6 14.9   Recent Labs    10/18/19 1019 10/19/19 0343  WBC 8.8 8.5  RBC 5.18 4.66  HCT 49.6 44.1  PLT 136* PLATELET CLUMPS NOTED ON SMEAR, UNABLE TO ESTIMATE   Recent Labs    10/18/19 1019 10/19/19 0343  NA 142 141  K 3.7 3.6  CL 107 109  CO2 23 22  BUN 21 28*  CREATININE 1.35* 1.59*  GLUCOSE 180* 137*  CALCIUM 9.4 8.6*   No results for input(s): LABPT, INR in the last 72 hours.  PE:  wn wd male in nad.  A and O x 4.  Mood and affect normal.  EOMI.  resp unlabored.  R foot with TTP at the 5th MT base.  5/5 strength in eversion.  Sens to LT intact.  Knee immobilizer in place and fitting  well.  Xray:  R foot films show a non displaced comminuted 5th MT base avulsion fracture.    Assessment/Plan: R quad tendon rupture - hold Xarelto.  To OR tomorrow for surgical repair.  NWB on R LE for the time being.  NPO after midnight.  L distal radius fracture - splint.  NWB.  R 5th MT base fracture - closed treatment.  WBAT on heel in cam boot.     Wylene Simmer 10/19/2019, 9:26 AM

## 2019-10-19 NOTE — Plan of Care (Signed)
  Problem: Education: Goal: Knowledge of General Education information will improve Description: Including pain rating scale, medication(s)/side effects and non-pharmacologic comfort measures 10/19/2019 0807 by Rodell Perna, RN Outcome: Progressing 10/18/2019 1901 by Rodell Perna, RN Outcome: Progressing   Problem: Health Behavior/Discharge Planning: Goal: Ability to manage health-related needs will improve 10/19/2019 0807 by Rodell Perna, RN Outcome: Progressing 10/18/2019 1901 by Rodell Perna, RN Outcome: Progressing   Problem: Clinical Measurements: Goal: Ability to maintain clinical measurements within normal limits will improve 10/19/2019 0807 by Rodell Perna, RN Outcome: Progressing 10/18/2019 1901 by Rodell Perna, RN Outcome: Progressing Goal: Will remain free from infection Outcome: Progressing   Problem: Activity: Goal: Risk for activity intolerance will decrease 10/19/2019 0807 by Rodell Perna, RN Outcome: Progressing 10/18/2019 1901 by Rodell Perna, RN Outcome: Progressing   Problem: Nutrition: Goal: Adequate nutrition will be maintained Outcome: Progressing   Problem: Coping: Goal: Level of anxiety will decrease 10/19/2019 0807 by Rodell Perna, RN Outcome: Progressing 10/18/2019 1901 by Rodell Perna, RN Outcome: Progressing   Problem: Elimination: Goal: Will not experience complications related to bowel motility 10/19/2019 0807 by Rodell Perna, RN Outcome: Progressing 10/18/2019 1901 by Rodell Perna, RN Outcome: Progressing   Problem: Pain Managment: Goal: General experience of comfort will improve 10/19/2019 0807 by Rodell Perna, RN Outcome: Progressing 10/18/2019 1901 by Rodell Perna, RN Outcome: Progressing   Problem: Safety: Goal: Ability to remain free from injury will improve 10/19/2019 0807 by Rodell Perna, RN Outcome: Progressing 10/18/2019 1901 by Rodell Perna, RN Outcome: Progressing   Problem: Skin  Integrity: Goal: Risk for impaired skin integrity will decrease Outcome: Progressing

## 2019-10-19 NOTE — Plan of Care (Signed)

## 2019-10-20 ENCOUNTER — Inpatient Hospital Stay (HOSPITAL_COMMUNITY): Payer: Medicare PPO

## 2019-10-20 ENCOUNTER — Encounter (HOSPITAL_COMMUNITY)
Admission: EM | Disposition: A | Discharge status: Rehabilitation Facility | Payer: Self-pay | Source: Home / Self Care | Attending: Orthopedic Surgery

## 2019-10-20 ENCOUNTER — Encounter (HOSPITAL_COMMUNITY): Payer: Self-pay | Admitting: Orthopedic Surgery

## 2019-10-20 HISTORY — PX: QUADRICEPS TENDON REPAIR: SHX756

## 2019-10-20 LAB — GLUCOSE, CAPILLARY
Glucose-Capillary: 116 mg/dL — ABNORMAL HIGH (ref 70–99)
Glucose-Capillary: 119 mg/dL — ABNORMAL HIGH (ref 70–99)
Glucose-Capillary: 226 mg/dL — ABNORMAL HIGH (ref 70–99)

## 2019-10-20 LAB — HEMOGLOBIN A1C
Hgb A1c MFr Bld: 7.1 % — ABNORMAL HIGH (ref 4.8–5.6)
Mean Plasma Glucose: 157.07 mg/dL

## 2019-10-20 SURGERY — REPAIR, TENDON, QUADRICEPS
Anesthesia: Regional | Site: Knee | Laterality: Right

## 2019-10-20 MED ORDER — PHENYLEPHRINE 40 MCG/ML (10ML) SYRINGE FOR IV PUSH (FOR BLOOD PRESSURE SUPPORT)
PREFILLED_SYRINGE | INTRAVENOUS | Status: AC
  Filled 2019-10-20: qty 20

## 2019-10-20 MED ORDER — HYDROCODONE-ACETAMINOPHEN 7.5-325 MG PO TABS
1.0000 | ORAL_TABLET | ORAL | Status: DC | PRN
  Administered 2019-10-25: 1 via ORAL
  Filled 2019-10-20 (×2): qty 1

## 2019-10-20 MED ORDER — LACTATED RINGERS IV SOLN: INTRAVENOUS | Status: DC

## 2019-10-20 MED ORDER — LIDOCAINE HCL (CARDIAC) PF 100 MG/5ML IV SOSY
PREFILLED_SYRINGE | INTRAVENOUS | Status: DC | PRN
  Administered 2019-10-20: 70 mg via INTRATRACHEAL

## 2019-10-20 MED ORDER — PANTOPRAZOLE SODIUM 40 MG PO TBEC
40.0000 mg | DELAYED_RELEASE_TABLET | Freq: Two times a day (BID) | ORAL | Status: DC
  Administered 2019-10-20 – 2019-10-27 (×14): 40 mg via ORAL
  Filled 2019-10-20 (×14): qty 1

## 2019-10-20 MED ORDER — MENTHOL 3 MG MT LOZG: 1.0000 | LOZENGE | OROMUCOSAL | Status: DC | PRN

## 2019-10-20 MED ORDER — LIDOCAINE 2% (20 MG/ML) 5 ML SYRINGE
INTRAMUSCULAR | Status: AC
  Filled 2019-10-20: qty 5

## 2019-10-20 MED ORDER — ONDANSETRON HCL 4 MG/2ML IJ SOLN
INTRAMUSCULAR | Status: AC
  Filled 2019-10-20: qty 2

## 2019-10-20 MED ORDER — BUPIVACAINE HCL (PF) 0.25 % IJ SOLN
INTRAMUSCULAR | Status: AC
  Filled 2019-10-20: qty 30

## 2019-10-20 MED ORDER — DOCUSATE SODIUM 100 MG PO CAPS
100.0000 mg | ORAL_CAPSULE | Freq: Two times a day (BID) | ORAL | Status: DC
  Administered 2019-10-21 – 2019-10-27 (×10): 100 mg via ORAL
  Filled 2019-10-20 (×12): qty 1

## 2019-10-20 MED ORDER — FENTANYL CITRATE (PF) 250 MCG/5ML IJ SOLN
INTRAMUSCULAR | Status: AC
  Filled 2019-10-20: qty 5

## 2019-10-20 MED ORDER — BISACODYL 10 MG RE SUPP: 10.0000 mg | Freq: Every day | RECTAL | Status: DC | PRN

## 2019-10-20 MED ORDER — HYDROCODONE-ACETAMINOPHEN 5-325 MG PO TABS
1.0000 | ORAL_TABLET | ORAL | Status: DC | PRN
  Administered 2019-10-24 – 2019-10-26 (×2): 1 via ORAL
  Filled 2019-10-20 (×2): qty 1

## 2019-10-20 MED ORDER — 0.9 % SODIUM CHLORIDE (POUR BTL) OPTIME
TOPICAL | Status: DC | PRN
  Administered 2019-10-20: 1000 mL

## 2019-10-20 MED ORDER — FENTANYL CITRATE (PF) 250 MCG/5ML IJ SOLN
INTRAMUSCULAR | Status: DC | PRN
  Administered 2019-10-20: 50 ug via INTRAVENOUS

## 2019-10-20 MED ORDER — ATORVASTATIN CALCIUM 10 MG PO TABS
10.0000 mg | ORAL_TABLET | Freq: Every day | ORAL | Status: DC
  Administered 2019-10-20 – 2019-10-26 (×7): 10 mg via ORAL
  Filled 2019-10-20 (×7): qty 1

## 2019-10-20 MED ORDER — METHOCARBAMOL 1000 MG/10ML IJ SOLN
500.0000 mg | Freq: Four times a day (QID) | INTRAVENOUS | Status: DC | PRN
  Filled 2019-10-20: qty 5

## 2019-10-20 MED ORDER — MAGNESIUM CITRATE PO SOLN: 1.0000 | Freq: Once | ORAL | Status: DC | PRN

## 2019-10-20 MED ORDER — ALUM & MAG HYDROXIDE-SIMETH 200-200-20 MG/5ML PO SUSP: 15.0000 mL | ORAL | Status: DC | PRN

## 2019-10-20 MED ORDER — DEXAMETHASONE SODIUM PHOSPHATE 10 MG/ML IJ SOLN
10.0000 mg | Freq: Once | INTRAMUSCULAR | Status: AC
  Administered 2019-10-21: 10 mg via INTRAVENOUS
  Filled 2019-10-20: qty 1

## 2019-10-20 MED ORDER — MIDAZOLAM HCL 2 MG/2ML IJ SOLN: 1.0000 mg | Freq: Once | INTRAMUSCULAR | Status: AC

## 2019-10-20 MED ORDER — BUPIVACAINE-EPINEPHRINE (PF) 0.5% -1:200000 IJ SOLN
INTRAMUSCULAR | Status: DC | PRN
  Administered 2019-10-20: 25 mL via PERINEURAL

## 2019-10-20 MED ORDER — ALPRAZOLAM 0.25 MG PO TABS: 0.2500 mg | ORAL_TABLET | Freq: Every evening | ORAL | Status: DC | PRN

## 2019-10-20 MED ORDER — CEFAZOLIN SODIUM-DEXTROSE 2-4 GM/100ML-% IV SOLN
2.0000 g | Freq: Four times a day (QID) | INTRAVENOUS | Status: AC
  Administered 2019-10-20 – 2019-10-21 (×2): 2 g via INTRAVENOUS
  Filled 2019-10-20 (×2): qty 100

## 2019-10-20 MED ORDER — METHOCARBAMOL 500 MG PO TABS
500.0000 mg | ORAL_TABLET | Freq: Four times a day (QID) | ORAL | Status: DC | PRN
  Administered 2019-10-26: 500 mg via ORAL
  Filled 2019-10-20: qty 1

## 2019-10-20 MED ORDER — ACETAMINOPHEN 325 MG PO TABS
325.0000 mg | ORAL_TABLET | Freq: Four times a day (QID) | ORAL | Status: DC | PRN
  Administered 2019-10-23 – 2019-10-27 (×7): 650 mg via ORAL
  Filled 2019-10-20 (×7): qty 2

## 2019-10-20 MED ORDER — FUROSEMIDE 40 MG PO TABS
40.0000 mg | ORAL_TABLET | ORAL | Status: DC
  Administered 2019-10-22 – 2019-10-26 (×3): 40 mg via ORAL
  Filled 2019-10-20 (×4): qty 1

## 2019-10-20 MED ORDER — DIPHENHYDRAMINE HCL 25 MG PO TABS: 25.0000 mg | ORAL_TABLET | Freq: Every evening | ORAL | Status: DC | PRN

## 2019-10-20 MED ORDER — FENTANYL CITRATE (PF) 100 MCG/2ML IJ SOLN: 50.0000 ug | Freq: Once | INTRAMUSCULAR | Status: AC

## 2019-10-20 MED ORDER — INSULIN ASPART 100 UNIT/ML ~~LOC~~ SOLN
0.0000 [IU] | Freq: Three times a day (TID) | SUBCUTANEOUS | Status: DC
  Administered 2019-10-21 (×3): 5 [IU] via SUBCUTANEOUS
  Administered 2019-10-22 (×2): 3 [IU] via SUBCUTANEOUS
  Administered 2019-10-23: 2 [IU] via SUBCUTANEOUS
  Administered 2019-10-23 – 2019-10-24 (×3): 3 [IU] via SUBCUTANEOUS
  Administered 2019-10-24: 5 [IU] via SUBCUTANEOUS
  Administered 2019-10-25 (×2): 3 [IU] via SUBCUTANEOUS
  Administered 2019-10-26: 2 [IU] via SUBCUTANEOUS
  Administered 2019-10-26: 5 [IU] via SUBCUTANEOUS
  Administered 2019-10-26 – 2019-10-27 (×3): 3 [IU] via SUBCUTANEOUS

## 2019-10-20 MED ORDER — INSULIN GLARGINE 100 UNIT/ML ~~LOC~~ SOLN
44.0000 [IU] | Freq: Every day | SUBCUTANEOUS | Status: DC
  Administered 2019-10-20 – 2019-10-26 (×7): 44 [IU] via SUBCUTANEOUS
  Filled 2019-10-20 (×8): qty 0.44

## 2019-10-20 MED ORDER — DIPHENHYDRAMINE HCL 12.5 MG/5ML PO ELIX: 12.5000 mg | ORAL_SOLUTION | ORAL | Status: DC | PRN

## 2019-10-20 MED ORDER — CEFAZOLIN SODIUM 1 G IJ SOLR
INTRAMUSCULAR | Status: AC
  Filled 2019-10-20: qty 20

## 2019-10-20 MED ORDER — GUAIFENESIN ER 600 MG PO TB12: 1200.0000 mg | ORAL_TABLET | Freq: Two times a day (BID) | ORAL | Status: DC | PRN

## 2019-10-20 MED ORDER — METOCLOPRAMIDE HCL 5 MG PO TABS: 5.0000 mg | ORAL_TABLET | Freq: Three times a day (TID) | ORAL | Status: DC | PRN

## 2019-10-20 MED ORDER — DEXAMETHASONE SODIUM PHOSPHATE 10 MG/ML IJ SOLN
INTRAMUSCULAR | Status: AC
  Filled 2019-10-20: qty 1

## 2019-10-20 MED ORDER — ONDANSETRON HCL 4 MG/2ML IJ SOLN: 4.0000 mg | Freq: Four times a day (QID) | INTRAMUSCULAR | Status: DC | PRN

## 2019-10-20 MED ORDER — PHENOL 1.4 % MT LIQD: 1.0000 | OROMUCOSAL | Status: DC | PRN

## 2019-10-20 MED ORDER — MIDAZOLAM HCL 2 MG/2ML IJ SOLN
INTRAMUSCULAR | Status: AC
  Administered 2019-10-20: 1 mg via INTRAVENOUS
  Filled 2019-10-20: qty 2

## 2019-10-20 MED ORDER — ACETAMINOPHEN 500 MG PO TABS
500.0000 mg | ORAL_TABLET | Freq: Four times a day (QID) | ORAL | Status: AC
  Administered 2019-10-20 – 2019-10-21 (×3): 500 mg via ORAL
  Filled 2019-10-20 (×3): qty 1

## 2019-10-20 MED ORDER — PHENYLEPHRINE 40 MCG/ML (10ML) SYRINGE FOR IV PUSH (FOR BLOOD PRESSURE SUPPORT)
PREFILLED_SYRINGE | INTRAVENOUS | Status: DC | PRN
  Administered 2019-10-20 (×3): 80 ug via INTRAVENOUS

## 2019-10-20 MED ORDER — RIVAROXABAN 2.5 MG PO TABS
2.5000 mg | ORAL_TABLET | Freq: Two times a day (BID) | ORAL | Status: DC
  Administered 2019-10-21 – 2019-10-27 (×13): 2.5 mg via ORAL
  Filled 2019-10-20 (×14): qty 1

## 2019-10-20 MED ORDER — SODIUM CHLORIDE 0.9 % IV SOLN: INTRAVENOUS | Status: DC

## 2019-10-20 MED ORDER — SACUBITRIL-VALSARTAN 49-51 MG PO TABS
1.0000 | ORAL_TABLET | Freq: Two times a day (BID) | ORAL | Status: DC
  Administered 2019-10-20 – 2019-10-27 (×14): 1 via ORAL
  Filled 2019-10-20 (×15): qty 1

## 2019-10-20 MED ORDER — MIDAZOLAM HCL 2 MG/2ML IJ SOLN
INTRAMUSCULAR | Status: AC
  Filled 2019-10-20: qty 2

## 2019-10-20 MED ORDER — ALLOPURINOL 300 MG PO TABS
300.0000 mg | ORAL_TABLET | Freq: Every day | ORAL | Status: DC
  Administered 2019-10-21 – 2019-10-27 (×7): 300 mg via ORAL
  Filled 2019-10-20 (×7): qty 1

## 2019-10-20 MED ORDER — CARVEDILOL 6.25 MG PO TABS
6.2500 mg | ORAL_TABLET | Freq: Two times a day (BID) | ORAL | Status: DC
  Administered 2019-10-20 – 2019-10-27 (×13): 6.25 mg via ORAL
  Filled 2019-10-20 (×14): qty 1

## 2019-10-20 MED ORDER — ONDANSETRON HCL 4 MG PO TABS: 4.0000 mg | ORAL_TABLET | Freq: Four times a day (QID) | ORAL | Status: DC | PRN

## 2019-10-20 MED ORDER — PROPOFOL 10 MG/ML IV BOLUS
INTRAVENOUS | Status: DC | PRN
Start: 2019-10-20 — End: 2019-10-20
  Administered 2019-10-20: 130 mg via INTRAVENOUS

## 2019-10-20 MED ORDER — DEXAMETHASONE SODIUM PHOSPHATE 10 MG/ML IJ SOLN
INTRAMUSCULAR | Status: DC | PRN
  Administered 2019-10-20: 10 mg via INTRAVENOUS

## 2019-10-20 MED ORDER — PHENYLEPHRINE HCL-NACL 10-0.9 MG/250ML-% IV SOLN
INTRAVENOUS | Status: DC | PRN
Start: 2019-10-20 — End: 2019-10-20
  Administered 2019-10-20: 20 ug/min via INTRAVENOUS

## 2019-10-20 MED ORDER — FENTANYL CITRATE (PF) 100 MCG/2ML IJ SOLN
INTRAMUSCULAR | Status: AC
  Administered 2019-10-20: 50 ug via INTRAVENOUS
  Filled 2019-10-20: qty 2

## 2019-10-20 MED ORDER — PROPOFOL 10 MG/ML IV BOLUS
INTRAVENOUS | Status: AC
  Filled 2019-10-20: qty 20

## 2019-10-20 MED ORDER — METOCLOPRAMIDE HCL 5 MG/ML IJ SOLN: 5.0000 mg | Freq: Three times a day (TID) | INTRAMUSCULAR | Status: DC | PRN

## 2019-10-20 MED ORDER — POLYETHYLENE GLYCOL 3350 17 G PO PACK
17.0000 g | PACK | Freq: Two times a day (BID) | ORAL | Status: DC
  Administered 2019-10-21 – 2019-10-26 (×4): 17 g via ORAL
  Filled 2019-10-20 (×12): qty 1

## 2019-10-20 MED ORDER — MORPHINE SULFATE (PF) 2 MG/ML IV SOLN: 0.5000 mg | INTRAVENOUS | Status: DC | PRN

## 2019-10-20 MED ORDER — ONDANSETRON HCL 4 MG/2ML IJ SOLN
INTRAMUSCULAR | Status: DC | PRN
  Administered 2019-10-20: 4 mg via INTRAVENOUS

## 2019-10-20 MED ORDER — LEVOTHYROXINE SODIUM 25 MCG PO TABS
125.0000 ug | ORAL_TABLET | Freq: Every day | ORAL | Status: DC
  Administered 2019-10-21 – 2019-10-27 (×7): 125 ug via ORAL
  Filled 2019-10-20 (×8): qty 1

## 2019-10-20 MED ORDER — FERROUS SULFATE 325 (65 FE) MG PO TABS
325.0000 mg | ORAL_TABLET | Freq: Two times a day (BID) | ORAL | Status: DC
  Administered 2019-10-20 – 2019-10-27 (×15): 325 mg via ORAL
  Filled 2019-10-20 (×15): qty 1

## 2019-10-20 SURGICAL SUPPLY — 52 items
BIT DRILL 2.8 (BIT) ×1
BIT DRILL CANN QC 2.8X165 (BIT) IMPLANT
BNDG COHESIVE 6X5 TAN STRL LF (GAUZE/BANDAGES/DRESSINGS) ×1 IMPLANT
BNDG ELASTIC 6X10 VLCR STRL LF (GAUZE/BANDAGES/DRESSINGS) ×1 IMPLANT
COVER WAND RF STERILE (DRAPES) ×1 IMPLANT
CUFF TOURN SGL QUICK 34 (TOURNIQUET CUFF) ×2
CUFF TOURN SGL QUICK 42 (TOURNIQUET CUFF) IMPLANT
CUFF TRNQT CYL 34X4.125X (TOURNIQUET CUFF) ×1 IMPLANT
DERMABOND ADVANCED (GAUZE/BANDAGES/DRESSINGS) ×1
DERMABOND ADVANCED .7 DNX12 (GAUZE/BANDAGES/DRESSINGS) IMPLANT
DRAPE INCISE IOBAN 66X45 STRL (DRAPES) ×1 IMPLANT
DRAPE U-SHAPE 47X51 STRL (DRAPES) ×2 IMPLANT
DRILL BIT 2.8MM (BIT) ×2
DRSG AQUACEL AG ADV 3.5X10 (GAUZE/BANDAGES/DRESSINGS) ×1 IMPLANT
ELECT REM PT RETURN 9FT ADLT (ELECTROSURGICAL) ×2
ELECTRODE REM PT RTRN 9FT ADLT (ELECTROSURGICAL) ×1 IMPLANT
GLOVE BIOGEL PI IND STRL 7.5 (GLOVE) ×1 IMPLANT
GLOVE BIOGEL PI IND STRL 8 (GLOVE) ×1 IMPLANT
GLOVE BIOGEL PI INDICATOR 7.5 (GLOVE) ×1
GLOVE BIOGEL PI INDICATOR 8 (GLOVE) ×1
GLOVE ORTHO TXT STRL SZ7.5 (GLOVE) ×2 IMPLANT
GLOVE SURG ORTHO 8.0 STRL STRW (GLOVE) ×2 IMPLANT
GOWN STRL REUS W/ TWL LRG LVL3 (GOWN DISPOSABLE) ×2 IMPLANT
GOWN STRL REUS W/TWL LRG LVL3 (GOWN DISPOSABLE) ×6
KIT BASIN OR (CUSTOM PROCEDURE TRAY) ×2 IMPLANT
KIT TURNOVER KIT B (KITS) ×2 IMPLANT
MANIFOLD NEPTUNE II (INSTRUMENTS) ×2 IMPLANT
NS IRRIG 1000ML POUR BTL (IV SOLUTION) ×2 IMPLANT
PACK ORTHO EXTREMITY (CUSTOM PROCEDURE TRAY) ×2 IMPLANT
PAD ARMBOARD 7.5X6 YLW CONV (MISCELLANEOUS) ×3 IMPLANT
PASSER SUT SWANSON 36MM LOOP (INSTRUMENTS) ×1 IMPLANT
SPONGE LAP 18X18 RF (DISPOSABLE) ×2 IMPLANT
SPONGE LAP 4X18 RFD (DISPOSABLE) ×1 IMPLANT
STAPLER VISISTAT 35W (STAPLE) ×1 IMPLANT
STOCKINETTE IMPERVIOUS 9X36 MD (GAUZE/BANDAGES/DRESSINGS) ×2 IMPLANT
SUT FIBERWIRE #2 38 T-5 BLUE (SUTURE) ×4
SUT MNCRL AB 4-0 PS2 18 (SUTURE) ×1 IMPLANT
SUT VIC AB 0 CT1 27 (SUTURE) ×2
SUT VIC AB 0 CT1 27XBRD ANBCTR (SUTURE) ×1 IMPLANT
SUT VIC AB 1 CT1 27 (SUTURE) ×4
SUT VIC AB 1 CT1 27XBRD ANBCTR (SUTURE) IMPLANT
SUT VIC AB 2-0 CT1 (SUTURE) ×1 IMPLANT
SUT VIC AB 2-0 CT1 27 (SUTURE)
SUT VIC AB 2-0 CT1 27XBRD (SUTURE) ×1 IMPLANT
SUT VIC AB 2-0 CT1 36 (SUTURE) ×1 IMPLANT
SUTURE FIBERWR #2 38 T-5 BLUE (SUTURE) ×1 IMPLANT
TOWEL GREEN STERILE (TOWEL DISPOSABLE) ×2 IMPLANT
TOWEL GREEN STERILE FF (TOWEL DISPOSABLE) ×2 IMPLANT
TUBE CONNECTING 12X1/4 (SUCTIONS) ×2 IMPLANT
UNDERPAD 30X36 HEAVY ABSORB (UNDERPADS AND DIAPERS) ×2 IMPLANT
WATER STERILE IRR 1000ML POUR (IV SOLUTION) ×2 IMPLANT
YANKAUER SUCT BULB TIP NO VENT (SUCTIONS) ×2 IMPLANT

## 2019-10-20 NOTE — Anesthesia Procedure Notes (Signed)
Anesthesia Regional Block: Femoral nerve block   Pre-Anesthetic Checklist: ,, timeout performed, Correct Patient, Correct Site, Correct Laterality, Correct Procedure, Correct Position, site marked, Risks and benefits discussed,  Surgical consent,  Pre-op evaluation,  At surgeon's request and post-op pain management  Laterality: Right and Lower  Prep: chloraprep       Needles:  Injection technique: Single-shot     Needle Length: 9cm  Needle Gauge: 22     Additional Needles: Arrow StimuQuik ECHO Echogenic Stimulating PNB Needle  Procedures:,,,, ultrasound used (permanent image in chart),,,,  Narrative:  Start time: 10/20/2019 1:21 PM End time: 10/20/2019 1:32 PM Injection made incrementally with aspirations every 5 mL.  Performed by: Personally  Anesthesiologist: Oleta Mouse, MD

## 2019-10-20 NOTE — Progress Notes (Signed)
Patient ID: Nathan Medina, male   DOB: May 22, 1950, 69 y.o.   MRN: [id number]  Comfortable  No events Challenges with mobilization recognized  AF VSS  To OR today for open repair of right quad tendon rupture NPO Consent ordered  Post op WBAT with knee in extension Resume Xarelto post op

## 2019-10-20 NOTE — Anesthesia Procedure Notes (Signed)
Procedure Name: LMA Insertion Date/Time: 10/20/2019 3:12 PM Performed by: Candis Shine, CRNA Pre-anesthesia Checklist: Patient identified, Emergency Drugs available, Suction available and Patient being monitored Patient Re-evaluated:Patient Re-evaluated prior to induction Oxygen Delivery Method: Circle System Utilized Preoxygenation: Pre-oxygenation with 100% oxygen Induction Type: IV induction LMA: LMA inserted LMA Size: 4.0 Number of attempts: 1 Placement Confirmation: positive ETCO2 Tube secured with: Tape Dental Injury: Teeth and Oropharynx as per pre-operative assessment

## 2019-10-20 NOTE — Anesthesia Postprocedure Evaluation (Signed)
Anesthesia Post Note  Patient: Nathan Medina  Procedure(s) Performed: REPAIR RIGHT QUADRICEP TENDON (Right Knee)     Patient location during evaluation: PACU Anesthesia Type: Regional and General Level of consciousness: awake and alert Pain management: pain level controlled Vital Signs Assessment: post-procedure vital signs reviewed and stable Respiratory status: spontaneous breathing, nonlabored ventilation, respiratory function stable and patient connected to nasal cannula oxygen Cardiovascular status: blood pressure returned to baseline and stable Postop Assessment: no apparent nausea or vomiting Anesthetic complications: no    Last Vitals:  Vitals:   10/20/19 1625 10/20/19 1640  BP: 121/73 114/68  Pulse: (!) 59 61  Resp: 12 13  Temp: 36.6 C 36.7 C  SpO2: 99% 99%    Last Pain:  Vitals:   10/20/19 1625  TempSrc:   PainSc: 0-No pain                 Barnet Glasgow

## 2019-10-20 NOTE — Plan of Care (Signed)

## 2019-10-20 NOTE — Transfer of Care (Signed)
Immediate Anesthesia Transfer of Care Note  Patient: Nathan Medina  Procedure(s) Performed: REPAIR RIGHT QUADRICEP TENDON (Right Knee)  Patient Location: PACU  Anesthesia Type:GA combined with regional for post-op pain  Level of Consciousness: awake, alert  and oriented  Airway & Oxygen Therapy: Patient Spontanous Breathing and Patient connected to face mask oxygen  Post-op Assessment: Report given to RN and Post -op Vital signs reviewed and stable  Post vital signs: Reviewed and stable  Last Vitals:  Vitals Value Taken Time  BP    Temp    Pulse    Resp    SpO2      Last Pain:  Vitals:   10/20/19 1100  TempSrc:   PainSc: 1          Complications: No apparent anesthesia complications

## 2019-10-20 NOTE — Brief Op Note (Signed)
10/18/2019 - 10/20/2019  2:51 PM  PATIENT:  Nathan Medina  69 y.o. male  PRE-OPERATIVE DIAGNOSIS:  RIGHT QUADRICEP TENDON RUPTURE  POST-OPERATIVE DIAGNOSIS:  RIGHT QUADRICEP TENDON RUPTURE  PROCEDURE:  Procedure(s): REPAIR QUADRICEP TENDON (Right)  SURGEON:  Surgeon(s) and Role:    Paralee Cancel, MD - Primary  PHYSICIAN ASSISTANT: Griffith Citron, PA-C  ANESTHESIA:   regional and general  EBL:  minimal  BLOOD ADMINISTERED:none  DRAINS: none   LOCAL MEDICATIONS USED:  NONE  SPECIMEN:  No Specimen  DISPOSITION OF SPECIMEN:  N/A  COUNTS:  YES  TOURNIQUET:  28 min at 250 mmHg  DICTATION: .Other Dictation: Dictation Number [id number]  PLAN OF CARE: Admit to inpatient   PATIENT DISPOSITION:  PACU - hemodynamically stable.   Delay start of Pharmacological VTE agent (>24hrs) due to surgical blood loss or risk of bleeding: no

## 2019-10-20 NOTE — Progress Notes (Signed)
Orthopedic Tech Progress Note Patient Details:  Nathan Medina July 21, 1950 [id number] [name] to work with patient and he already has knee immobilizer on from ortho on 10/18/2019 Patient ID: Nathan Medina, male   DOB: 1950/08/30, 69 y.o.   MRN: [id number]   [name] 10/20/2019, 7:04 PM

## 2019-10-20 NOTE — Anesthesia Preprocedure Evaluation (Addendum)
Anesthesia Evaluation  Patient identified by MRN, date of birth, ID band Patient awake    Reviewed: Allergy & Precautions, NPO status , Patient's Chart, lab work & pertinent test results  History of Anesthesia Complications Negative for: history of anesthetic complications  Airway Mallampati: II  TM Distance: >3 FB Neck ROM: Full    Dental  (+) Dental Advisory Given   Pulmonary shortness of breath, neg sleep apnea, neg recent URI,    breath sounds clear to auscultation       Cardiovascular hypertension, Pt. on home beta blockers and Pt. on medications + CAD, + CABG and +CHF   Rhythm:Regular   Left Atrium: Normal size, no evidence of LAA thrombus with PWD  velocities > 40 mm/s in LAA   Left Ventricle: Mildy dilated chamber size and reduced systolic  function, LVEF [id number], Hypokinesis of the anteroseptal wall. 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 No PFO or ASD on color flow  doppler.   Right ventricle: Normal wall thickness. Cavity is small. Mildly reduced  systolic function.   Pulmonic valve: Trace regurgitation.   Aortic Valve: Tricuspid valve noted, valve leaflets demonstrate normal  motion with minimal sclerosis or calcification.   Mitral Valve: Normal mitral annular size. Normal leaflet motion. Normal  mitral inflow waveforms on CWD.   Aorta: Grade 1 calcification descending aorta and aortic arch, no  evidence of dissection. Minimal dilation of the ascending aorta noted.   Pericardium: No significant pericardial effusion.    Neuro/Psych PSYCHIATRIC DISORDERS Anxiety Depression  Neuromuscular disease    GI/Hepatic Neg liver ROS, GERD  Controlled and Medicated,  Endo/Other  diabetes, Insulin DependentHypothyroidism Lab Results      Component                Value               Date                      HGBA1C                   6.2 (H)              02/14/2018          \  Renal/GU CRFRenal diseaseLab Results      Component                Value               Date                      CREATININE               1.59 (H)            10/19/2019                BUN                      28 (H)              10/19/2019                NA                       141                 10/19/2019  K                        3.6                 10/19/2019                CL                       109                 10/19/2019                CO2                      22                  10/19/2019                Musculoskeletal  (+) Arthritis ,   Abdominal   Peds  Hematology Lab Results      Component                Value               Date                      WBC                      8.5                 10/19/2019                HGB                      14.9                10/19/2019                HCT                      44.1                10/19/2019                MCV                      94.6                10/19/2019                PLT                                          10/19/2019            PLATELET CLUMPS NOTED ON SMEAR, UNABLE TO ESTIMATE >   Anesthesia Other Findings   Reproductive/Obstetrics                            Anesthesia Physical Anesthesia Plan  ASA: III  Anesthesia Plan: General and Regional   Post-op Pain Management:  Regional for Post-op pain   Induction: Intravenous  PONV Risk Score and Plan: 2 and Ondansetron and Dexamethasone  Airway Management Planned: Oral  ETT and LMA  Additional Equipment: None  Intra-op Plan:   Post-operative Plan: Extubation in OR  Informed Consent: I have reviewed the patients History and Physical, chart, labs and discussed the procedure including the risks, benefits and alternatives for the proposed anesthesia with the patient or authorized representative who has indicated his/her understanding and acceptance.       Plan Discussed  with: CRNA and Surgeon  Anesthesia Plan Comments:         Anesthesia Quick Evaluation

## 2019-10-20 NOTE — Op Note (Signed)
NAME: IAM, SENDER MEDICAL RECORD [id number] ACCOUNT [id number] DATE OF BIRTH:December 24, 1950 FACILITY: [facility] LOCATION: [facility] PHYSICIAN:Kalynn Declercq Marian Sorrow, MD  OPERATIVE REPORT  DATE OF PROCEDURE:  10/20/2019  PREOPERATIVE DIAGNOSIS:  Right quadriceps tendon rupture.  POSTOPERATIVE DIAGNOSIS:  Right quadriceps tendon rupture.  PROCEDURE:  Open repair of right quadriceps tendon utilizing two #2 FiberWire sutures with a Krakow weave pattern through the tendon and drill holes through the patella.  SURGEON:  Paralee Cancel, MD  ASSISTANT:  Griffith Citron, PA-C.  Note that Ms. Nehemiah Settle was present for the entirety of the case from preoperative positioning, perioperative management of operative extremity, general facilitation of the case and primary wound closure.    ANESTHESIA:  Regional plus general.  SPECIMENS:  None.  COMPLICATIONS:  None.  TOURNIQUET:  28 minutes at 250 mmHg.  INDICATIONS:  The patient is a 69 year old male who unfortunately had a slip and fall at his home.  He was brought to the emergency room due to inability to bear weight.  Radiographs revealed a fifth metatarsal base fracture, right quadriceps tendon  rupture within patella baja, as well as a nondisplaced fracture of the left distal radius.  He was admitted to the hospital due to these injuries.  Additionally, he was on Xarelto preoperatively.  Planned for surgery 48 hours after admission.  Risks,  benefits and the necessity of the procedure were discussed and reviewed.  Consent was obtained for benefit of tendon healing and improved function.  DESCRIPTION OF PROCEDURE:  The patient was brought to the operative theater.  Once adequate anesthesia, preoperative antibiotics, Ancef administered, he was positioned supine with a right thigh tourniquet placed.  The right lower extremity was then  prepped and draped in sterile fashion.  Timeout was performed, identifying the patient, the planned procedure and extremity.   The leg was exsanguinated, tourniquet elevated to 250 mmHg.  A midline incision was made, followed by soft tissue exposure  identifying an obvious quadriceps tendon rupture, as well as medial and lateral extension.  There was noted to be a large hematoma within his knee.  This was evacuated and his knee was irrigated to remove all of the old blood.  At this point, I debrided  the proximal pole of the patella to get a bony bed for better bone-tendon healing.  Once this was done, we passed two #2 FiberWire sutures in a Krakow weave pattern from medially and laterally.  With the 4 strands exiting the distal aspect of the tendon,  I then drilled the 3 parallel drill holes through the patella and passed the sutures through these.  We then applied proximal traction on the patella as distal traction was applied on 2 of the strands of suture.  I sutured down the lateral sutures  first, followed by the medial suture.  At this point, there was excellent bony contact of the tendon to bone.  Given the findings, there was some overlapping retinacular tissue that was preserved.  I then used #1 Vicryl suture and reapproximated the  medial and lateral retinaculum and extended over the anterior aspect of the knee.  Through the longitudinal splits in the patellar tendon for this passing of suture, these longitudinal splits were reapproximated using #1 Vicryl as well.  The tourniquet  was let down after 28 minutes.  There was no significant hemostasis required.  The knee was re-irrigated with normal saline solution.  At this point,  the remainder of the wound was closed with 2-0 Vicryl and a running Monocryl stitch.  The knee was then  cleaned, dried and dressed sterilely using surgical glue and Aquacel dressing.  The patient was then brought to the recovery room in stable condition, tolerating the procedure well.  Postoperatively, was this will be a challenge logistically for this his social situation as he lives alone.   Additionally, from a physical standpoint, it will be a challenge as he will have a knee immobilizer or a  T-ROM brace locked in extension with a  Cam walker boot.  This will need to be worked out while in the hospital prior to discharge.  VN/NUANCE  D:10/20/2019 T:10/20/2019 JOB:[id number]

## 2019-10-21 LAB — GLUCOSE, CAPILLARY
Glucose-Capillary: 215 mg/dL — ABNORMAL HIGH (ref 70–99)
Glucose-Capillary: 214 mg/dL — ABNORMAL HIGH (ref 70–99)
Glucose-Capillary: 248 mg/dL — ABNORMAL HIGH (ref 70–99)

## 2019-10-21 LAB — BASIC METABOLIC PANEL
Anion gap: 10 (ref 5–15)
BUN: 31 mg/dL — ABNORMAL HIGH (ref 8–23)
CO2: 21 mmol/L — ABNORMAL LOW (ref 22–32)
Calcium: 8.9 mg/dL (ref 8.9–10.3)
Chloride: 107 mmol/L (ref 98–111)
Creatinine, Ser: 1.35 mg/dL — ABNORMAL HIGH (ref 0.61–1.24)
GFR calc Af Amer: >60 mL/min (ref >60)
GFR calc non Af Amer: 53 mL/min — ABNORMAL LOW (ref >60)
Glucose, Bld: 243 mg/dL — ABNORMAL HIGH (ref 70–99)
Potassium: 4.1 mmol/L (ref 3.5–5.1)
Sodium: 138 mmol/L (ref 135–145)

## 2019-10-21 LAB — CBC
HCT: 40.7 % (ref 39.0–52.0)
Hemoglobin: 13.3 g/dL (ref 13.0–17.0)
MCH: 31.7 pg (ref 26.0–34.0)
MCHC: 32.7 g/dL (ref 30.0–36.0)
MCV: 97.1 fL (ref 80.0–100.0)
Platelets: 104 10*3/uL — ABNORMAL LOW (ref 150–400)
RBC: 4.19 MIL/uL — ABNORMAL LOW (ref 4.22–5.81)
RDW: 14.3 % (ref 11.5–15.5)
WBC: 6.1 10*3/uL (ref 4.0–10.5)
nRBC: 0 % (ref 0.0–0.2)

## 2019-10-21 NOTE — Progress Notes (Signed)
Orthopedic Tech Progress Note Patient Details:  Nathan Medina [birth year] [id number] Called in order to [facility] for a [place] Patient ID: [name], male   DOB: 1950/08/29, 69 y.o.   MRN: [id number]   Janit Pagan 10/21/2019, 8:32 AM

## 2019-10-21 NOTE — Evaluation (Signed)
Occupational Therapy Evaluation Patient Details Name: Nathan Medina MRN: [id number] DOB: January 06, 1951 Today's Date: 10/21/2019    History of Present Illness Pt is a 69 y.o. with significant PMH of CHF, CKD, DM type 2, who presents after a fall with right quadriceps tendon rupture s/p repair 10/20/2019, L distal radius fracture, and R 5th MT base fracture.    Clinical Impression   PTA pt living alone, independent for all BADL/IADL. At time of eval, pt presents with limitations due to RLE/LUE precautions s/p fall. Pt completed sit <> stand with mod A with cueing for safe limb placement with platform walker. Pt was able to complete functional mobility ~10 feet, slow and guarded with step by step cueing for limb placement and walker placement. Pt had uncontrolled descent into chair due to difficulty maneuvering RLE with transfer. Educated pt on splint wear schedule, skin integrity/checks, and importance of elevation and finger ROM for swelling. Recommend CIR at d/c for continued functional progression prior to d/c home. Suspect pt can reach mod I level in rehab. Will continue to follow per POC listed below.     Follow Up Recommendations  CIR    Equipment Recommendations  3 in 1 bedside commode;Wheelchair (measurements OT);Wheelchair cushion (measurements OT)    Recommendations for Other Services Rehab consult     Precautions / Restrictions Precautions Precautions: Fall Required Braces or Orthoses: Other Brace Other Brace: R Bledshoe brace at all times, R CAM boot Restrictions Weight Bearing Restrictions: Yes RUE Weight Bearing: Partial weight bearing RUE Partial Weight Bearing Percentage or Pounds: LUE NWB (ok to weightbearing through elbow for platform walker) RLE Weight Bearing: Weight bearing as tolerated      Mobility Bed Mobility               General bed mobility comments: up in chair  Transfers Overall transfer level: Needs assistance Equipment used: Left platform  walker Transfers: Sit to/from Stand Sit to Stand: Mod assist         General transfer comment: mod A to power into staanding with platform walker. Cues for limb placement and safety    Balance Overall balance assessment: Needs assistance Sitting-balance support: Feet supported Sitting balance-Leahy Scale: Good     Standing balance support: No upper extremity supported;During functional activity Standing balance-Leahy Scale: Fair Standing balance comment: Fair statically                           ADL either performed or assessed with clinical judgement   ADL Overall ADL's : Needs assistance/impaired Eating/Feeding: Set up;Sitting   Grooming: Set up;Sitting   Upper Body Bathing: Minimal assistance;Sitting   Lower Body Bathing: Maximal assistance;Sitting/lateral leans;Sit to/from stand   Upper Body Dressing : Minimal assistance;Sitting;Adhering to UE precautions   Lower Body Dressing: Maximal assistance;Sitting/lateral leans;Sit to/from stand   Toilet Transfer: Moderate assistance;Stand-pivot;BSC Toilet Transfer Details (indicate cue type and reason): mod A to rise and steady with platform walker, simulated with recliner Toileting- Clothing Manipulation and Hygiene: Sit to/from stand       Functional mobility during ADLs: Minimal assistance;Cueing for safety;Rolling walker       Vision Baseline Vision/History: Wears glasses Wears Glasses: At all times Patient Visual Report: No change from baseline       Perception     Praxis      Pertinent Vitals/Pain Pain Assessment: Faces Faces Pain Scale: Hurts little more Pain Location: No pain at rest. R knee pain with weightbearing Pain  Descriptors / Indicators: Grimacing;Guarding Pain Intervention(s): Monitored during session     Hand Dominance     Extremity/Trunk Assessment Upper Extremity Assessment Upper Extremity Assessment: Overall WFL for tasks assessed;LUE deficits/detail LUE Deficits /  Details: assessed splint for pressure areas, can move fingers without issue. Mild swelling   Lower Extremity Assessment Lower Extremity Assessment: Defer to PT evaluation       Communication Communication Communication: No difficulties   Cognition Arousal/Alertness: Awake/alert Behavior During Therapy: WFL for tasks assessed/performed Overall Cognitive Status: Within Functional Limits for tasks assessed                                     General Comments       Exercises Other Exercises Other Exercises: light composite flexion/extension of digits with finger ab/adduction to reduce swelling and maintain finger ROM   Shoulder Instructions      Home Living Family/patient expects to be discharged to:: Private residence Living Arrangements: Alone Available Help at Discharge: Family;Available PRN/intermittently(sister) Type of Home: House Home Access: Stairs to enter [employer] of Steps: 3 Entrance Stairs-Rails: Right;Left Home Layout: Able to live on main level with bedroom/bathroom     Bathroom Shower/Tub: Tub/shower unit;Walk-in shower         Home Equipment: Gilford Rile - 4 wheels;Wheelchair - manual          Prior Functioning/Environment Level of Independence: Independent        Comments: Driving, independent ADL's/IADL's        OT Problem List: Decreased strength;Decreased knowledge of use of DME or AE;Decreased knowledge of precautions;Decreased range of motion;Decreased activity tolerance;Impaired UE functional use;Impaired balance (sitting and/or standing);Pain      OT Treatment/Interventions: Self-care/ADL training;Therapeutic exercise;Patient/family education;Balance training;Energy conservation;Therapeutic activities;DME and/or AE instruction    OT Goals(Current goals can be found in the care plan section) Acute Rehab OT Goals Patient Stated Goal: to go home OT Goal Formulation: With patient/family Time For Goal Achievement:  11/04/19 Potential to Achieve Goals: Good  OT Frequency: Min 2X/week   Barriers to D/C:            Co-evaluation              AM-PAC OT "6 Clicks" Daily Activity     Outcome Measure Help from another person eating meals?: None Help from another person taking care of personal grooming?: A Little Help from another person toileting, which includes using toliet, bedpan, or urinal?: A Lot Help from another person bathing (including washing, rinsing, drying)?: A Lot Help from another person to put on and taking off regular upper body clothing?: A Little Help from another person to put on and taking off regular lower body clothing?: A Lot 6 Click Score: 16   End of Session Equipment Utilized During Treatment: Gait belt;Rolling walker Nurse Communication: Mobility status;Precautions;Weight bearing status  Activity Tolerance: Patient tolerated treatment well Patient left: in chair;with call bell/phone within reach;with chair alarm set;with family/visitor present  OT Visit Diagnosis: Unsteadiness on feet (R26.81);Other abnormalities of gait and mobility (R26.89);History of falling (Z91.81);Muscle weakness (generalized) (M62.81);Pain Pain - Right/Left: Right Pain - part of body: Leg                Time: 1523-1610 OT Time Calculation (min): 47 min Charges:  OT General Charges $OT Visit: 1 Visit OT Evaluation $OT Eval Moderate Complexity: 1 Mod OT Treatments $Self Care/Home Management : 23-37 mins  Zenovia Jarred,  MSOT, OTR/L Acute Rehabilitation Services [facility] Office Number: [phone] Pager: [phone]  Zenovia Jarred 10/21/2019, 6:44 PM

## 2019-10-21 NOTE — Evaluation (Addendum)
Physical Therapy Evaluation Patient Details Name: Nathan Medina MRN: [id number] DOB: Apr 05, 1951 Today's Date: 10/21/2019   History of Present Illness  Pt is a 69 y.o. with significant PMH of CHF, CKD, DM type 2, who presents after a fall with right quadriceps tendon rupture s/p repair 10/20/2019, L distal radisu fracture, and R 5th MT base fracture.   Clinical Impression  Prior to admission, pt lives alone and is independent. His sister will be available for support as needed. Pt denying pain at rest; positive right knee pain with weightbearing. Ambulating 3 feet with two person moderate assist for stability and displays left knee buckle. Will likely benefit from external support. Discussed with MD Alvan Dame post session and pt can now weightbear through elbow to use a left platform walker. Will trial next session. Pt presents with decreased functional mobility secondary to pain, weakness, balance impairments. Recommending CIR to address deficits and maximize functional independence. Anticipate pt will be able to progress to modI level given motivation, age, and PLOF.     Follow Up Recommendations CIR    Equipment Recommendations  3in1 (PT);Wheelchair (measurements PT);(L platform walker)    Recommendations for Other Services       Precautions / Restrictions Precautions Precautions: Fall Required Braces or Orthoses: Other Brace Other Brace: R Bledshoe brace at all times, R CAM boot Restrictions Weight Bearing Restrictions: Yes LUE Weightbearing: NWB (ok to weightbearing through elbow for platform walker) RLE Weight Bearing: Weight bearing as tolerated      Mobility  Bed Mobility Overal bed mobility: Needs Assistance Bed Mobility: Supine to Sit     Supine to sit: Min assist     General bed mobility comments: Light minA for RLE negotiation off of bed  Transfers Overall transfer level: Needs assistance Equipment used: None Transfers: Sit to/from Stand Sit to Stand: +2  safety/equipment;Min assist         General transfer comment: MinA to power up from edge of bed, cues for R foot placement  Ambulation/Gait Ambulation/Gait assistance: Mod assist;+2 physical assistance Gait Distance (Feet): 3 Feet Assistive device: 1 person hand held assist Gait Pattern/deviations: Step-through pattern;Antalgic;Decreased weight shift to right;Decreased stance time - right Gait velocity: decreased Gait velocity interpretation: <1.31 ft/sec, indicative of household ambulator General Gait Details: Pt requiring modA + 2 for stability, positive left knee buckle  Stairs            Wheelchair Mobility    Modified Rankin (Stroke Patients Only)       Balance Overall balance assessment: Needs assistance Sitting-balance support: Feet supported Sitting balance-Leahy Scale: Good     Standing balance support: No upper extremity supported;During functional activity Standing balance-Leahy Scale: Fair Standing balance comment: Fair statically                             Pertinent Vitals/Pain Pain Assessment: Faces Faces Pain Scale: Hurts little more Pain Location: No pain at rest. R knee pain with weightbearing Pain Descriptors / Indicators: Grimacing;Guarding Pain Intervention(s): Monitored during session    Home Living Family/patient expects to be discharged to:: Private residence Living Arrangements: Alone Available Help at Discharge: Family;Available PRN/intermittently(sister) Type of Home: House Home Access: Stairs to enter Entrance Stairs-Rails: Psychiatric nurse of Steps: 3 Home Layout: Able to live on main level with bedroom/bathroom Home Equipment: Walker - 4 wheels;Wheelchair - manual      Prior Function Level of Independence: Independent  Comments: Driving, independent ADL's/IADL's     Hand Dominance        Extremity/Trunk Assessment   Upper Extremity Assessment Upper Extremity Assessment:  Overall WFL for tasks assessed    Lower Extremity Assessment Lower Extremity Assessment: RLE deficits/detail RLE Deficits / Details: quad tendon rupture s/p repair in [place]. Ankle dorsiflexion/plantarflexion WFL    Cervical / Trunk Assessment Cervical / Trunk Assessment: Normal  Communication   Communication: No difficulties  Cognition Arousal/Alertness: Awake/alert Behavior During Therapy: WFL for tasks assessed/performed Overall Cognitive Status: Within Functional Limits for tasks assessed                                        General Comments      Exercises     Assessment/Plan    PT Assessment Patient needs continued PT services  PT Problem List Decreased strength;Decreased activity tolerance;Decreased balance;Decreased mobility;Pain       PT Treatment Interventions DME instruction;Gait training;Stair training;Functional mobility training;Therapeutic activities;Therapeutic exercise;Balance training;Patient/family education    PT Goals (Current goals can be found in the Care Plan section)  Acute Rehab PT Goals Patient Stated Goal: to go home PT Goal Formulation: With patient Time For Goal Achievement: 11/04/19 Potential to Achieve Goals: Good    Frequency Min 5X/week   Barriers to discharge Decreased caregiver support      Co-evaluation               AM-PAC PT "6 Clicks" Mobility  Outcome Measure Help needed turning from your back to your side while in a flat bed without using bedrails?: None Help needed moving from lying on your back to sitting on the side of a flat bed without using bedrails?: A Little Help needed moving to and from a bed to a chair (including a wheelchair)?: A Lot Help needed standing up from a chair using your arms (e.g., wheelchair or bedside chair)?: A Little Help needed to walk in hospital room?: A Lot Help needed climbing 3-5 steps with a railing? : A Lot 6 Click Score: 16    End of Session Equipment Utilized  During Treatment: Gait belt;Right knee immobilizer Activity Tolerance: Patient tolerated treatment well Patient left: in chair;with call bell/phone within reach;with chair alarm set;with family/visitor present Nurse Communication: Mobility status PT Visit Diagnosis: Pain;Difficulty in walking, not elsewhere classified (R26.2);Other abnormalities of gait and mobility (R26.89) Pain - Right/Left: Right Pain - part of body: Knee    Time: [id number] PT Time Calculation (min) (ACUTE ONLY): 22 min   Charges:   PT Evaluation $PT Eval Moderate Complexity: 1 Mod            Wyona Almas, PT, DPT Acute Rehabilitation Services Pager [phone] Office [phone]   Deno Etienne 10/21/2019, 11:49 AM

## 2019-10-21 NOTE — Progress Notes (Signed)
     Subjective: 1 Day Post-Op Procedure(s) (LRB): REPAIR RIGHT QUADRICEP TENDON (Right)   Patient reports pain as mild, states that he really isn't having much pain.  Discussed the importance of maintain full extension at this time to allow the tissues to heal.  Discussed situation and how he really doesn't have much as regards to resources to help at home. Discussed case management to help provide potential options.    Objective:   VITALS:   Vitals:   10/20/19 1813 10/20/19 1914  BP: 116/73 133/67  Pulse: 69 (!) 59  Resp: 14 16  Temp: 98 F (36.7 C) 97.7 F (36.5 C)  SpO2: 96% 98%    Dorsiflexion/Plantar flexion intact Incision: dressing C/D/I No cellulitis present Compartment soft  LABS Recent Labs    10/18/19 1019 10/19/19 0343 10/21/19 0333  HGB 16.6 14.9 13.3  HCT 49.6 44.1 40.7  WBC 8.8 8.5 6.1  PLT 136* PLATELET CLUMPS NOTED ON SMEAR, UNABLE TO ESTIMATE 104*    Recent Labs    10/18/19 1019 10/19/19 0343 10/21/19 0333  NA 142 141 138  K 3.7 3.6 4.1  BUN 21 28* 31*  CREATININE 1.35* 1.59* 1.35*  GLUCOSE 180* 137* 243*     Assessment/Plan: 1 Day Post-Op Procedure(s) (LRB): REPAIR RIGHT QUADRICEP TENDON (Right)   Advance diet Up with therapy D/C IV fluids Discharge disposition TBD        Danae Orleans PA-C  [facility]  [place] [address], [place], [place] [address] Phone: [phone] [email or web address] Facebook  [employer]

## 2019-10-21 NOTE — Progress Notes (Signed)
Inpatient Rehab Admissions Coordinator Note:   Per PT recommendations, pt was screened for CIR candidacy by Shann Medal, PT, DPT.  At this time we are recommending a CIR consult.  Please place an order if pt would like to be considered.   Shann Medal, PT, DPT [phone] 10/21/19 1:57 PM

## 2019-10-21 NOTE — TOC CAGE-AID Note (Signed)
Transition of Care [facility]) - CAGE-AID Screening   Patient Details  Name: Nathan Medina MRN: [id number] Date of Birth: 06/02/1950  Transition of Care [facility]) CM/SW Contact:    Emeterio Reeve, [place] Phone Number: 10/21/2019, 2:25 PM   Clinical Narrative:  Pt stated he may have a glass of wine every couple of months. Pt denies substance use.   Pt asked CSW about CIR. CSW explained CIR and encouraged pt to talk to PT or CIR admissions director.   CAGE-AID Screening:    Have You Ever Felt You Ought to Cut Down on Your Drinking or Drug Use?: No Have People Annoyed You By Critizing Your Drinking Or Drug Use?: No Have You Felt Bad Or Guilty About Your Drinking Or Drug Use?: No Have You Ever Had a Drink or Used Drugs First Thing In The Morning to STeady Your Nerves or to Get Rid of a Hangover?: No CAGE-AID Score: 0  Substance Abuse Education Offered: No    Blima Ledger, [place] Social Worker [phone]

## 2019-10-21 NOTE — Plan of Care (Signed)

## 2019-10-21 NOTE — Plan of Care (Signed)
  Problem: Activity: Goal: Risk for activity intolerance will decrease Outcome: Progressing   Problem: Coping: Goal: Level of anxiety will decrease Outcome: Progressing   Problem: Pain Managment: Goal: General experience of comfort will improve Outcome: Progressing   

## 2019-10-22 LAB — BASIC METABOLIC PANEL
Anion gap: 8 (ref 5–15)
BUN: 38 mg/dL — ABNORMAL HIGH (ref 8–23)
CO2: 20 mmol/L — ABNORMAL LOW (ref 22–32)
Calcium: 8.9 mg/dL (ref 8.9–10.3)
Chloride: 110 mmol/L (ref 98–111)
Creatinine, Ser: 1.35 mg/dL — ABNORMAL HIGH (ref 0.61–1.24)
GFR calc Af Amer: >60 mL/min (ref >60)
GFR calc non Af Amer: 53 mL/min — ABNORMAL LOW (ref >60)
Glucose, Bld: 188 mg/dL — ABNORMAL HIGH (ref 70–99)
Potassium: 4 mmol/L (ref 3.5–5.1)
Sodium: 138 mmol/L (ref 135–145)

## 2019-10-22 LAB — CBC
HCT: 40.7 % (ref 39.0–52.0)
Hemoglobin: 13.6 g/dL (ref 13.0–17.0)
MCH: 32.2 pg (ref 26.0–34.0)
MCHC: 33.4 g/dL (ref 30.0–36.0)
MCV: 96.2 fL (ref 80.0–100.0)
Platelets: 125 10*3/uL — ABNORMAL LOW (ref 150–400)
RBC: 4.23 MIL/uL (ref 4.22–5.81)
RDW: 14.3 % (ref 11.5–15.5)
WBC: 12.1 10*3/uL — ABNORMAL HIGH (ref 4.0–10.5)
nRBC: 0 % (ref 0.0–0.2)

## 2019-10-22 LAB — GLUCOSE, CAPILLARY
Glucose-Capillary: 170 mg/dL — ABNORMAL HIGH (ref 70–99)
Glucose-Capillary: 139 mg/dL — ABNORMAL HIGH (ref 70–99)
Glucose-Capillary: 171 mg/dL — ABNORMAL HIGH (ref 70–99)
Glucose-Capillary: 140 mg/dL — ABNORMAL HIGH (ref 70–99)

## 2019-10-22 MED ORDER — METHOCARBAMOL 500 MG PO TABS: 500.0000 mg | ORAL_TABLET | Freq: Four times a day (QID) | ORAL | 0 refills | Status: DC | PRN

## 2019-10-22 MED ORDER — HYDROCODONE-ACETAMINOPHEN 5-325 MG PO TABS: 1.0000 | ORAL_TABLET | Freq: Three times a day (TID) | ORAL | 0 refills | Status: DC | PRN

## 2019-10-22 NOTE — Progress Notes (Signed)
Physical Therapy Treatment Patient Details Name: Nathan Medina MRN: [id number] DOB: 27-Dec-1950 Today's Date: 10/22/2019    History of Present Illness Pt is a 69 y.o. with significant PMH of CHF, CKD, DM type 2, who presents after a fall with right quadriceps tendon rupture s/p repair 10/20/2019, L distal radius fracture, and R 5th MT base fracture.     PT Comments    Pt performed gt training this session with L platform RW.  He fatigues quickly with increased activity.  Pt continues to benefit from aggressive rehab at CIR to improve strength and function before returning home.  Pt tolerated session well.  Adjusted bledsoe for fit as it was starting to slide down.     Follow Up Recommendations  CIR     Equipment Recommendations  3in1 (PT);Other (comment);Wheelchair (measurements PT)(L platform RW)    Recommendations for Other Services       Precautions / Restrictions Precautions Precautions: Fall Required Braces or Orthoses: Other Brace Other Brace: R Bledshoe brace at all times, R CAM boot Restrictions Weight Bearing Restrictions: Yes RUE Partial Weight Bearing Percentage or Pounds: LUE NWB (ok to weightbearing through elbow for platform walker) RLE Weight Bearing: Weight bearing as tolerated(in CAM walker) Other Position/Activity Restrictions: LUE NWB (ok to weightbearing through elbow for platform walker)    Mobility  Bed Mobility Overal bed mobility: Needs Assistance Bed Mobility: Sit to Supine       Sit to supine: Min assist   General bed mobility comments: Min assistance to lift RLE against gravity for positioning back to bed.  Transfers Overall transfer level: Needs assistance Equipment used: Left platform walker Transfers: Sit to/from Stand Sit to Stand: Mod assist         General transfer comment: Mod assistance to move into standing.  Cues for L UE and RLE to improve ease and maintain precautions.  Ambulation/Gait Ambulation/Gait assistance: Min  assist;Mod assist;+2 safety/equipment Gait Distance (Feet): 40 Feet Assistive device: Left platform walker Gait Pattern/deviations: Step-through pattern;Antalgic;Decreased weight shift to right;Decreased stance time - right Gait velocity: decreased   General Gait Details: Pt required min to mod assistance.  Increased assistance as activity increased. Pt required cues for RW safety and R quad control in stance phase.  Stride starts to shorten with fatigue.   Stairs             Wheelchair Mobility    Modified Rankin (Stroke Patients Only)       Balance Overall balance assessment: Needs assistance Sitting-balance support: Feet supported Sitting balance-Leahy Scale: Good       Standing balance-Leahy Scale: Fair Standing balance comment: Fair statically                            Cognition Arousal/Alertness: Awake/alert Behavior During Therapy: WFL for tasks assessed/performed Overall Cognitive Status: Within Functional Limits for tasks assessed                                        Exercises      General Comments        Pertinent Vitals/Pain Pain Assessment: Faces Faces Pain Scale: Hurts little more Pain Location: R quad at rest post ambulation. Pain Descriptors / Indicators: Grimacing;Guarding Pain Intervention(s): Monitored during session;Repositioned    Home Living  Prior Function            PT Goals (current goals can now be found in the care plan section) Acute Rehab PT Goals Patient Stated Goal: to go home Potential to Achieve Goals: Good Progress towards PT goals: Progressing toward goals    Frequency    Min 5X/week      PT Plan Current plan remains appropriate    Co-evaluation              AM-PAC PT "6 Clicks" Mobility   Outcome Measure  Help needed turning from your back to your side while in a flat bed without using bedrails?: None Help needed moving from lying on  your back to sitting on the side of a flat bed without using bedrails?: A Little Help needed moving to and from a bed to a chair (including a wheelchair)?: A Lot Help needed standing up from a chair using your arms (e.g., wheelchair or bedside chair)?: A Little Help needed to walk in hospital room?: A Lot Help needed climbing 3-5 steps with a railing? : A Lot 6 Click Score: 16    End of Session Equipment Utilized During Treatment: Gait belt;Right knee immobilizer(knee immobilized in locked out bledsoe brace.) Activity Tolerance: Patient tolerated treatment well Patient left: in bed;with call bell/phone within reach;with bed alarm set Nurse Communication: Mobility status PT Visit Diagnosis: Pain;Difficulty in walking, not elsewhere classified (R26.2);Other abnormalities of gait and mobility (R26.89) Pain - Right/Left: Right Pain - part of body: Knee     Time: [id number] PT Time Calculation (min) (ACUTE ONLY): 41 min  Charges:  $Gait Training: 8-22 mins $Therapeutic Activity: 23-37 mins                     Erasmo Leventhal , PTA Acute Rehabilitation Services Pager [phone] Office [phone]     Kendle Erker Eli Hose 10/22/2019, 6:25 PM

## 2019-10-22 NOTE — TOC Initial Note (Signed)
Transition of Care [facility]) - Initial/Assessment Note    Patient Details  Name: Nathan Medina MRN: [id number] Date of Birth: 1951-01-29  [facility]) CM/SW Contact:    Nathan Labrum, RN Phone Number: 10/22/2019, 10:14 AM  Clinical Narrative:                  Case management met with the patient and the patient lives alone and fell after stepping in cat vomit on the floor at home.  Patient states that he does not have anyone to help him at home including neighbors and friends.  Patient is a Right quad tendon repair by Dr. Alvan Dame on 6/1.  Patient is waiting on CIR approval.  I spoke with the patient to offer medicare choice regarding SNF placement in case CIR is not approved or no availability for beds.  Patient would prefer CIR and declines SNF placement at this time.  Will continue to follow for safe discharge plan.  Expected Discharge Plan: IP Rehab Facility Barriers to Discharge: (waiting on insurance authorization for CIR)   Patient Goals and CMS Choice Patient states their goals for this hospitalization and ongoing recovery are:: Patient lives alone and aims to go to CIR for rehab.  Refusing SNF for now. CMS Medicare.gov Compare Post Acute Care list provided to:: Patient Choice offered to / list presented to : Patient  Expected Discharge Plan and Services Expected Discharge Plan: [place]   Discharge Planning Services: CM Consult Post Acute Care Choice: IP Rehab   Expected Discharge Date: 10/22/19                                    Prior Living Arrangements/Services   Lives with:: Self Patient language and need for interpreter reviewed:: Yes Do you feel safe going back to the place where you live?: Yes      Need for Family Participation in Patient Care: Yes (Comment) Care giver support system in place?: Yes (comment)   Criminal Activity/Legal Involvement Pertinent to Current Situation/Hospitalization: No - Comment as  needed  Activities of Daily Living Home Assistive Devices/Equipment: None ADL Screening (condition at time of admission) Patient's cognitive ability adequate to safely complete daily activities?: Yes Is the patient deaf or have difficulty hearing?: No Does the patient have difficulty seeing, even when wearing glasses/contacts?: No Does the patient have difficulty concentrating, remembering, or making decisions?: No Patient able to express need for assistance with ADLs?: Yes Does the patient have difficulty dressing or bathing?: Yes Independently performs ADLs?: Yes (appropriate for developmental age) Does the patient have difficulty walking or climbing stairs?: Yes Weakness of Legs: Right Weakness of Arms/Hands: Left  Permission Sought/Granted Permission sought to share information with : Case Manager Permission granted to share information with : Yes, Verbal Permission Granted     Permission granted to share info w AGENCY: CIR and possibly SNF if patient approves of the facility  Permission granted to share info w Relationship: sister - Nathan Medina [phone]     Emotional Assessment   Attitude/Demeanor/Rapport: Gracious Affect (typically observed): Accepting Orientation: : Oriented to Self, Oriented to Place, Oriented to  Time, Oriented to Situation Alcohol / Substance Use: Not Applicable Psych Involvement: No (comment)  Admission diagnosis:  Patellar tendon rupture [S86.819A] Effusion of right knee [M25.461] Fall, initial encounter [W19.XXXA] Closed fracture of distal end of left radius, unspecified fracture morphology, initial encounter [S52.502A] Closed nondisplaced  fracture of fifth metatarsal bone of right foot, initial encounter [S92.354A] Rupture of right quadriceps tendon [D82.099A] Patient Active Problem List   Diagnosis Date Noted  . Patellar tendon rupture 10/18/2019  . Rupture of right quadriceps tendon 10/18/2019  . Ischemic cardiomyopathy 07/24/2018  .  Right upper lobe pulmonary nodule 07/24/2018  . CHF (congestive heart failure) ([place]) 07/24/2018  . Ascending aorta dilation (HCC) 07/24/2018  . 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 Coronary artery disease 01/15/2018  . S/P thyroidectomy 01/15/2018  . S/P cardiac cath 01/14/2018  . Abnormal stress test 01/12/2018  . 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 Multiple thyroid nodules 10/16/2017  . Peritonsillar abscess 08/01/2016  . Renal failure (ARF), acute on chronic (HCC) 08/01/2016  . Tonsil, abscess 07/31/2016  . Back pain 04/03/2012  . Ventral hernia 10/22/2011  . Lumbar radicular pain 06/03/2011  . PSA elevation 06/01/2011  . Left renal mass 05/23/2011  . Abnormal liver function test 05/23/2011  . Rash 04/02/2011  . Hearing loss 11/28/2010  . Preventative health care 09/17/2010  . 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 PERIPHERAL EDEMA 11/04/2009  . Epidermal cyst 08/01/2007  . Type 2 diabetes mellitus with renal manifestations ([place]) 06/25/2007  . HYPERLIPIDEMIA 06/25/2007  . GOUT 06/25/2007  . ANXIETY 06/25/2007  . DEPRESSION 06/25/2007  . Essential hypertension 06/25/2007  . ALLERGIC RHINITIS 06/25/2007  . GERD 06/25/2007  . IBS 06/25/2007  . COLONIC POLYPS, HX OF 06/25/2007   PCP:  [name], MD Pharmacy:   [facility], [place] [id number] Phone: [phone] Fax: [phone]     Social Determinants of Health (SDOH) Interventions    Readmission Risk Interventions Readmission Risk Prevention Plan 10/22/2019  Post Dischage Appt Complete  Medication Screening Complete  Transportation Screening Complete  Some recent data might be hidden

## 2019-10-22 NOTE — Progress Notes (Signed)
     Subjective: 2 Days Post-Op Procedure(s) (LRB): REPAIR RIGHT QUADRICEP TENDON (Right)   Patient reports pain as none, in the right knee. States that the left wrist splint bothers him more than anything at this point. Feels that he is doing well.  Discussed the process with regards to CIR placement.  Order placed and hopefully CIR will have a bed and approval for the patient.  We have discussed SNF placement otherwise, but PT is feeling he would benefit from CIR.  Once approved and a bed is available the patient will be ready to be discharged to the facility.      Objective:   VITALS:   Vitals:   10/22/19 0436 10/22/19 0740  BP: (!) 156/82 (!) 141/88  Pulse: (!) 58 60  Resp: 16 16  Temp: (!) 97.5 F (36.4 C) 98 F (36.7 C)  SpO2: 96% 99%    Right LE Dorsiflexion/Plantar flexion intact Incision: dressing C/D/I No cellulitis present Compartment soft Boot with ambulation  Left wrist Wrist splint in place  LABS Recent Labs    10/21/19 0333 10/22/19 0657  HGB 13.3 13.6  HCT 40.7 40.7  WBC 6.1 12.1*  PLT 104* 125*    Recent Labs    10/21/19 0333 10/22/19 0657  NA 138 138  K 4.1 4.0  BUN 31* 38*  CREATININE 1.35* 1.35*  GLUCOSE 243* 188*     Assessment/Plan: 2 Days Post-Op Procedure(s) (LRB): REPAIR RIGHT QUADRICEP TENDON (Right)  Order placed for CIR consult Up with therapy Discharge to CIR when approved and bed ready If not approved for CIR, SNF placement will need to be arranged        Danae Orleans PA-C  [facility]  [place] [address], [address], [place], [place] [address] Phone: [phone] [email or web address] Facebook  [employer]

## 2019-10-22 NOTE — Progress Notes (Signed)
Inpatient Rehab Admissions:  Inpatient Rehab Consult received.  I met with pt at the bedside for rehabilitation assessment and to discuss goals and expectations of an inpatient rehab admission. We discussed CIR details including expected LOS and anticipated assist at DC. Feel pt is an appropriate candidate for CIR. I have confirmed DC support at a supervision level with his sister. I will begin insurance authorization process for possible admit. Will follow up once there has been a determination.    [place], OTR/L  Rehab Admissions Coordinator  [phone] 10/22/2019 1:10 PM    

## 2019-10-23 LAB — GLUCOSE, CAPILLARY
Glucose-Capillary: 123 mg/dL — ABNORMAL HIGH (ref 70–99)
Glucose-Capillary: 173 mg/dL — ABNORMAL HIGH (ref 70–99)
Glucose-Capillary: 156 mg/dL — ABNORMAL HIGH (ref 70–99)
Glucose-Capillary: 157 mg/dL — ABNORMAL HIGH (ref 70–99)

## 2019-10-23 NOTE — Plan of Care (Signed)

## 2019-10-23 NOTE — Progress Notes (Signed)
Physical Therapy Treatment Patient Details Name: Nathan Medina MRN: [id number] DOB: 1951/05/02 Today's Date: 10/23/2019    History of Present Illness Pt is a 69 y.o. with significant PMH of CHF, CKD, DM type 2, who presents after a fall with right quadriceps tendon rupture s/p repair 10/20/2019, L distal radius fracture, and R 5th MT base fracture.     PT Comments    Pt reporting increased right quad soreness, however still quite motivated to participate in physical therapy. Session focused on gait training and strengthening exercises. Requiring minimal assist for functional mobility. Ambulating 50 feet with a walker and close chair follow. Continues with RLE weakness, balance deficits, gait abnormalities, and pain. Pt remains a great candidate for CIR to address deficits and maximize functional mobility in order to progress to a modified independent level.    Follow Up Recommendations  CIR     Equipment Recommendations  3in1 (PT);Wheelchair (measurements PT)(L platform walker)    Recommendations for Other Services       Precautions / Restrictions Precautions Precautions: Fall Required Braces or Orthoses: Other Brace Other Brace: R Bledshoe brace at all times, R CAM boot Restrictions Weight Bearing Restrictions: Yes RUE Partial Weight Bearing Percentage or Pounds: LUE NWB (ok to weightbearing through elbow for platform walker) RLE Weight Bearing: Weight bearing as tolerated Other Position/Activity Restrictions: LUE NWB (ok to weightbearing through elbow for platform walker)    Mobility  Bed Mobility Overal bed mobility: Needs Assistance Bed Mobility: Supine to Sit     Supine to sit: Min guard     General bed mobility comments: Able to progress RLE off edge of bed without physical assist  Transfers Overall transfer level: Needs assistance Equipment used: Left platform walker Transfers: Sit to/from Stand Sit to Stand: Min assist         General transfer comment:  MinA to rise from elevated bed height. cues for hand/foot positioning  Ambulation/Gait Ambulation/Gait assistance: Min assist Gait Distance (Feet): 50 Feet Assistive device: Left platform walker Gait Pattern/deviations: Step-through pattern;Antalgic;Decreased weight shift to right;Decreased stance time - right Gait velocity: decreased   General Gait Details: Cues for upright posture, weightbearing through L elbow, decreased pelvic rotation, walker proximity   Stairs             Wheelchair Mobility    Modified Rankin (Stroke Patients Only)       Balance Overall balance assessment: Needs assistance Sitting-balance support: Feet supported Sitting balance-Leahy Scale: Good     Standing balance support: Bilateral upper extremity supported;During functional activity Standing balance-Leahy Scale: Fair                              Cognition Arousal/Alertness: Awake/alert Behavior During Therapy: WFL for tasks assessed/performed Overall Cognitive Status: Within Functional Limits for tasks assessed                                        Exercises General Exercises - Lower Extremity Hip ABduction/ADduction: AAROM;Right;Supine;Other (comment)(7 reps) Straight Leg Raises: AAROM;Right;10 reps;Supine    General Comments        Pertinent Vitals/Pain Pain Assessment: Faces Faces Pain Scale: Hurts little more Pain Location: R knee Pain Descriptors / Indicators: Grimacing;Guarding Pain Intervention(s): Monitored during session;Limited activity within patient's tolerance;Ice applied    Home Living  Prior Function            PT Goals (current goals can now be found in the care plan section) Acute Rehab PT Goals Patient Stated Goal: to go home Potential to Achieve Goals: Good Progress towards PT goals: Progressing toward goals    Frequency    Min 5X/week      PT Plan Current plan remains appropriate     Co-evaluation              AM-PAC PT "6 Clicks" Mobility   Outcome Measure  Help needed turning from your back to your side while in a flat bed without using bedrails?: None Help needed moving from lying on your back to sitting on the side of a flat bed without using bedrails?: A Little Help needed moving to and from a bed to a chair (including a wheelchair)?: A Little Help needed standing up from a chair using your arms (e.g., wheelchair or bedside chair)?: A Little Help needed to walk in hospital room?: A Little Help needed climbing 3-5 steps with a railing? : A Lot 6 Click Score: 18    End of Session Equipment Utilized During Treatment: Gait belt;Other (comment)(knee brace, CAM boot) Activity Tolerance: Patient tolerated treatment well Patient left: in chair;with call bell/phone within reach;with chair alarm set Nurse Communication: Mobility status PT Visit Diagnosis: Pain;Difficulty in walking, not elsewhere classified (R26.2);Other abnormalities of gait and mobility (R26.89) Pain - Right/Left: Right Pain - part of body: Knee     Time: 6644-0347 PT Time Calculation (min) (ACUTE ONLY): 30 min  Charges:  $Gait Training: 23-37 mins                       Wyona Almas, PT, DPT Acute Rehabilitation Services Pager [phone] Office [phone]    Deno Etienne 10/23/2019, 1:46 PM

## 2019-10-23 NOTE — Progress Notes (Signed)
Inpatient Rehabilitation-Admissions Coordinator   [employer] requested a peer to peer prior to making a final determination for CIR. PM&R MD Dr. Delice Lesch agreed to complete the peer to peer. Unfortunately, despite the peer to peer conference, the patient's request has been officially denied. The patient asked about appeal options. Notified him that I should have that information by Monday and did discuss that an appeal may delay the start of his post acute rehab and that he may also continue to progress past the point of needing CIR by the time an appeal has been completed. Pt understands his rights to appeal and is currently reviewing his options (including SNF vs HH). I have notified TOC team regarding denial. If pt remains in house Monday, will follow up with him.   Raechel Ache, OTR/L  Rehab Admissions Coordinator  [phone] 10/23/2019 4:35 PM

## 2019-10-23 NOTE — Plan of Care (Signed)
  Problem: Education: Goal: Knowledge of General Education information will improve Description: Including pain rating scale, medication(s)/side effects and non-pharmacologic comfort measures Outcome: Progressing   Problem: Health Behavior/Discharge Planning: Goal: Ability to manage health-related needs will improve Outcome: Progressing   Problem: Clinical Measurements: Goal: Will remain free from infection Outcome: Progressing   

## 2019-10-23 NOTE — Progress Notes (Signed)
Subjective: 3 Days Post-Op Procedure(s) (LRB): REPAIR RIGHT QUADRICEP TENDON (Right) Patient reports pain as mild.   Patient seen in rounds for Dr. Alvan Dame. Patient is well, and has had no acute complaints or problems. No acute events overnight. He is resting in bed this morning on exam.  Plan is to go Home after hospital stay.  Objective: Vital signs in last 24 hours: Temp:  [97.8 F (36.6 C)-98.3 F (36.8 C)] 98.2 F (36.8 C) (06/04 0832) Pulse Rate:  [57-65] 57 (06/04 0832) Resp:  [16-17] 16 (06/04 0832) BP: (123-154)/(77-99) 154/96 (06/04 0832) SpO2:  [95 %-99 %] 99 % (06/04 0832)  Intake/Output from previous day:  Intake/Output Summary (Last 24 hours) at 10/23/2019 1159 Last data filed at 10/23/2019 0900 Gross per 24 hour  Intake 995.2 ml  Output 1300 ml  Net -304.8 ml    Intake/Output this shift: Total I/O In: 360 [P.O.:360] Out: 700 [Urine:700]  Labs: Recent Labs    10/21/19 0333 10/22/19 0657  HGB 13.3 13.6   Recent Labs    10/21/19 0333 10/22/19 0657  WBC 6.1 12.1*  RBC 4.19* 4.23  HCT 40.7 40.7  PLT 104* 125*   Recent Labs    10/21/19 0333 10/22/19 0657  NA 138 138  K 4.1 4.0  CL 107 110  CO2 21* 20*  BUN 31* 38*  CREATININE 1.35* 1.35*  GLUCOSE 243* 188*  CALCIUM 8.9 8.9   No results for input(s): LABPT, INR in the last 72 hours.  Exam: General - Patient is Alert and Oriented Extremity - Neurologically intact Sensation intact distally Intact pulses distally Dorsiflexion/Plantar flexion intact Dressing/Incision - clean, dry, no drainage. Aquacel dressing in place. Bledsoe dressing in place.  Motor Function - intact, moving foot and toes well on exam.   Past Medical History:  Diagnosis Date   Abnormal liver function test 05/23/2011   ALLERGIC RHINITIS    ANXIETY    Aortic atherosclerosis (HCC)    Arthritis    Ascending aorta dilation ([place]) 07/24/2018   Bilateral renal cysts    Cervical spondylolysis    Moderate   CHF (congestive  heart failure) ([place]) 07/24/2018   Chronic kidney disease    CKD stage 3 per office visit note 08/08/17 on chart    COLONIC POLYPS, HX OF    Coronary artery disease    DEPRESSION    DIABETES MELLITUS, TYPE II    Diverticulitis    Epidermal cyst    Fatty liver    GERD    GOUT    History of inguinal hernia    HOH (hard of hearing)    elft ear   HYPERLIPIDEMIA    HYPERTENSION    IBS    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, takedown and reversal September 2012   Ischemic cardiomyopathy 07/24/2018   Left renal mass 05/23/2011   Lumbar radicular pain 06/03/2011   Macular degeneration    right eye   Multinodular goiter    Pancreatic pseudocyst    Stable   Peritonsillar abscess    Pulmonary nodule    Right upper lobe   Radial neck fracture    Right upper lobe pulmonary nodule 07/24/2018   Shortness of breath    occasional shortness of breath  with exertion   Thyroid disease    Thyroid nodule    Bilateral   Vertigo     Assessment/Plan: 3 Days Post-Op Procedure(s) (LRB): REPAIR RIGHT QUADRICEP TENDON (Right) Active Problems:  Patellar tendon rupture   Rupture of right quadriceps tendon  Estimated body mass index is 33.03 kg/m as calculated from the following:   Height as of this encounter: 5' 7.5" (1.715 m).   Weight as of this encounter: 97.1 kg. Advance diet Up with therapy  DVT Prophylaxis - Xarelto Weight-bearing as tolerated NO ROM of the right knee, remain in brace Ambulate with right lower extremity boot  Plan for discharge to CIR when bed is available and he is approved. Continue working with therapy.    Griffith Citron, PA-C Orthopedic Surgery [phone] 10/23/2019, 11:59 AM

## 2019-10-24 LAB — GLUCOSE, CAPILLARY
Glucose-Capillary: 118 mg/dL — ABNORMAL HIGH (ref 70–99)
Glucose-Capillary: 164 mg/dL — ABNORMAL HIGH (ref 70–99)
Glucose-Capillary: 202 mg/dL — ABNORMAL HIGH (ref 70–99)
Glucose-Capillary: 166 mg/dL — ABNORMAL HIGH (ref 70–99)

## 2019-10-24 MED ORDER — HYDROCODONE-ACETAMINOPHEN 5-325 MG PO TABS: 1.0000 | ORAL_TABLET | Freq: Three times a day (TID) | ORAL | 0 refills | Status: DC | PRN

## 2019-10-24 MED ORDER — METHOCARBAMOL 500 MG PO TABS: 500.0000 mg | ORAL_TABLET | Freq: Four times a day (QID) | ORAL | 0 refills | Status: DC | PRN

## 2019-10-24 NOTE — Progress Notes (Addendum)
Subjective: 4 Days Post-Op Procedure(s) (LRB): REPAIR RIGHT QUADRICEP TENDON (Right) Patient reports pain as mild.   Patient seen in rounds for Dr. Alvan Dame. Patient is well, and has had no acute complaints or problems. No acute events overnight. Patient was denied admission to CIR despite peer-to-peer. Patient resting comfortably in bed this morning with sister at bedside. They are reviewing options for SNF placement. He denies CP, SHOB, N/V. Voiding without difficulty, positive BM.  Plan is to go Skilled nursing facility after hospital stay.  Objective: Vital signs in last 24 hours: Temp:  [97.7 F (36.5 C)-98.5 F (36.9 C)] 98 F (36.7 C) (06/05 0805) Pulse Rate:  [58-78] 68 (06/05 0805) Resp:  [12-18] 18 (06/05 0805) BP: (126-138)/(79-87) 138/81 (06/05 0805) SpO2:  [96 %-100 %] 96 % (06/05 0805)  Intake/Output from previous day:  Intake/Output Summary (Last 24 hours) at 10/24/2019 1054 Last data filed at 10/23/2019 1900 Gross per 24 hour  Intake 720 ml  Output 400 ml  Net 320 ml    Intake/Output this shift: No intake/output data recorded.  Labs: Recent Labs    10/22/19 0657  HGB 13.6   Recent Labs    10/22/19 0657  WBC 12.1*  RBC 4.23  HCT 40.7  PLT 125*   Recent Labs    10/22/19 0657  NA 138  K 4.0  CL 110  CO2 20*  BUN 38*  CREATININE 1.35*  GLUCOSE 188*  CALCIUM 8.9   No results for input(s): LABPT, INR in the last 72 hours.  Exam: General - Patient is Alert and Oriented Extremity - Neurologically intact Sensation intact distally Intact pulses distally Dorsiflexion/Plantar flexion intact Dressing/Incision - clean, dry, no drainage Motor Function - intact in RLE, limited due to concurrent right fifth metatarsal base fracture.   Past Medical History:  Diagnosis Date  . Abnormal liver function test 05/23/2011  . ALLERGIC RHINITIS   . ANXIETY   . Aortic atherosclerosis ([place])   . Arthritis   . Ascending aorta dilation (HCC) 07/24/2018  .  Bilateral renal cysts   . Cervical spondylolysis    Moderate  . CHF (congestive heart failure) ([place]) 07/24/2018  . Chronic kidney disease    CKD stage 3 per office visit note 08/08/17 on chart   . COLONIC POLYPS, HX OF   . Coronary artery disease   . DEPRESSION   . DIABETES MELLITUS, TYPE II   . Diverticulitis   . Epidermal cyst   . Fatty liver   . GERD   . GOUT   . History of inguinal hernia   . HOH (hard of hearing)    elft ear  . HYPERLIPIDEMIA   . HYPERTENSION   . IBS   . 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 Ischemic cardiomyopathy 07/24/2018  . Left renal mass 05/23/2011  . Lumbar radicular pain 06/03/2011  . Macular degeneration    right eye  . Multinodular goiter   . Pancreatic pseudocyst    Stable  . Peritonsillar abscess   . Pulmonary nodule    Right upper lobe  . Radial neck fracture   . Right upper lobe pulmonary nodule 07/24/2018  . Shortness of breath    occasional shortness of breath  with exertion  . Thyroid disease   . Thyroid nodule    Bilateral  . Vertigo     Assessment/Plan: 4 Days Post-Op Procedure(s) (LRB): REPAIR RIGHT QUADRICEP TENDON (Right) Active Problems:  Patellar tendon rupture   Rupture of right quadriceps tendon  Estimated body mass index is 33.03 kg/m as calculated from the following:   Height as of this encounter: 5' 7.5" (1.715 m).   Weight as of this encounter: 97.1 kg. Advance diet Up with therapy  DVT Prophylaxis - Aspirin Weight-bearing as tolerated RLE Bledsoe brace to remain locked in extension at all times RLE boot to use for ambulation  NWB LUE with splint to remain in place  Patient was denied admission to CIR despite peer to peer. He and his sister agree that discharge to SNF will be his best and safest option in this situation. They are reviewing options for SNF placement today, and he will be ready for discharge once placement is  obtained. Since it is the weekend, it will likely be Monday before this can be arranged. Continue working with PT.   Griffith Citron, PA-C Orthopedic Surgery [phone] 10/24/2019, 10:54 AM

## 2019-10-25 LAB — GLUCOSE, CAPILLARY
Glucose-Capillary: 99 mg/dL (ref 70–99)
Glucose-Capillary: 193 mg/dL — ABNORMAL HIGH (ref 70–99)
Glucose-Capillary: 189 mg/dL — ABNORMAL HIGH (ref 70–99)
Glucose-Capillary: 155 mg/dL — ABNORMAL HIGH (ref 70–99)

## 2019-10-25 NOTE — Plan of Care (Signed)
  Problem: Clinical Measurements: Goal: Respiratory complications will improve Outcome: Completed/Met Goal: Cardiovascular complication will be avoided Outcome: Completed/Met

## 2019-10-25 NOTE — TOC Progression Note (Signed)
Transition of Care [facility]) - Progression Note    Patient Details  Name: Nathan Medina MRN: [id number] Date of Birth: 06-25-1950  Transition of Care [facility]) CM/SW [place], [place] Phone Number: [phone] 10/25/2019, 2:59 PM  Clinical Narrative:     CSW spoke with patient to discuss his discharge planning. Patient stated at this point he is agreeable to a SNF. CSW reviewed current offers. Patient stated that he would like to see if more come back on Monday. CSW stated that it would be okay since insurance authorization is still needed. Patient stated that he prefers [facility] or [facility].  CSW initiated authorization through [facility]. Reference # [id number] with a start date of 6/7. Once facility is chosen Navi will need to be updated. Clinicals faxed.  TOC team will continue to monitor for discharge planning needs.  Expected Discharge Plan: IP Rehab Facility Barriers to Discharge: (waiting on insurance authorization for CIR)  Expected Discharge Plan and Services Expected Discharge Plan: [place]   Discharge Planning Services: CM Consult Post Acute Care Choice: IP Rehab   Expected Discharge Date: 10/22/19                                     Social Determinants of Health (SDOH) Interventions    Readmission Risk Interventions Readmission Risk Prevention Plan 10/22/2019  Post Dischage Appt Complete  Medication Screening Complete  Transportation Screening Complete  Some recent data might be hidden

## 2019-10-25 NOTE — Progress Notes (Addendum)
Physical Therapy Treatment Patient Details Name: Nathan Medina MRN: [id number] DOB: [birth year] Today's Date: 10/25/2019    History of Present Illness Pt is a 69 y.o. with significant PMH of CHF, CKD, DM type 2, who presents after a fall with right quadriceps tendon rupture s/p repair 10/20/2019, L distal radius fracture, and R 5th MT base fracture.     PT Comments    Pt with some regression towards physical therapy goals since PT session 2 days ago (6/4); more limited by pain and weakness today. Requiring increased assist for transfers with decreased ambulation distances. Ice applied post session and RN notified for pain medication. Despite this, pt remains highly motivated to participate and get stronger. Will still benefit from post acute rehab to address deficits and maximize functional independence.    Follow Up Recommendations  SNF     Equipment Recommendations  3in1 (PT);Other (comment);Wheelchair (measurements PT)(L platform RW)    Recommendations for Other Services       Precautions / Restrictions Precautions Precautions: Fall Required Braces or Orthoses: Other Brace Other Brace: R Bledshoe brace at all times, R CAM boot, L wrist splint Restrictions Weight Bearing Restrictions: Yes RLE Weight Bearing: Weight bearing as tolerated Other Position/Activity Restrictions: LUE NWB (ok to weightbearing through elbow for platform walker)    Mobility  Bed Mobility Overal bed mobility: Needs Assistance Bed Mobility: Sit to Supine       Sit to supine: Min assist   General bed mobility comments: MinA for RLE negotiation back into bed  Transfers Overall transfer level: Needs assistance Equipment used: Left platform walker Transfers: Sit to/from Stand Sit to Stand: Mod assist         General transfer comment: ModA to rise from recliner surface.   Ambulation/Gait Ambulation/Gait assistance: Min assist Gait Distance (Feet): 20 Feet Assistive device: Left platform  walker Gait Pattern/deviations: Step-through pattern;Antalgic;Decreased weight shift to right;Decreased stance time - right Gait velocity: decreased Gait velocity interpretation: <1.31 ft/sec, indicative of household ambulator General Gait Details: Slow pace, heavy reliance through arms, right knee instability requiring minA. Difficulty advancing LLE at times with fatigue. NT had previously donned a pillow case on R CAM boot to facilitate progression of RLE, no slippage noted and pt able to demo safe technique with it donned.    Stairs             Wheelchair Mobility    Modified Rankin (Stroke Patients Only)       Balance Overall balance assessment: Needs assistance Sitting-balance support: Feet supported Sitting balance-Leahy Scale: Good     Standing balance support: Bilateral upper extremity supported;During functional activity Standing balance-Leahy Scale: Fair                              Cognition Arousal/Alertness: Awake/alert Behavior During Therapy: WFL for tasks assessed/performed Overall Cognitive Status: Within Functional Limits for tasks assessed                                        Exercises General Exercises - Lower Extremity Long Arc Quad: Left;10 reps;Seated(with resistance) Hip Flexion/Marching: Left;10 reps;Seated    General Comments        Pertinent Vitals/Pain Pain Assessment: Faces Faces Pain Scale: Hurts whole lot Pain Location: R knee Pain Descriptors / Indicators: Grimacing;Guarding Pain Intervention(s): Limited activity within patient's tolerance;Monitored during session;Patient requesting  pain meds-RN notified;Ice applied    Home Living                      Prior Function            PT Goals (current goals can now be found in the care plan section) Acute Rehab PT Goals Patient Stated Goal: less pain, get stronger Potential to Achieve Goals: Good Progress towards PT goals: Not progressing  toward goals - comment(pain)    Frequency    Min 3X/week      PT Plan Discharge plan needs to be updated;Frequency needs to be updated    Co-evaluation              AM-PAC PT "6 Clicks" Mobility   Outcome Measure  Help needed turning from your back to your side while in a flat bed without using bedrails?: None Help needed moving from lying on your back to sitting on the side of a flat bed without using bedrails?: A Little Help needed moving to and from a bed to a chair (including a wheelchair)?: A Little Help needed standing up from a chair using your arms (e.g., wheelchair or bedside chair)?: A Lot Help needed to walk in hospital room?: A Little Help needed climbing 3-5 steps with a railing? : Total 6 Click Score: 16    End of Session Equipment Utilized During Treatment: Gait belt;Other (comment)(knee brace, CAM boot, L wrist splint) Activity Tolerance: Patient limited by pain Patient left: in bed;with call bell/phone within reach;with bed alarm set Nurse Communication: Mobility status;Patient requests pain meds PT Visit Diagnosis: Pain;Difficulty in walking, not elsewhere classified (R26.2);Other abnormalities of gait and mobility (R26.89) Pain - Right/Left: Right Pain - part of body: Knee     Time: 3614-4315 PT Time Calculation (min) (ACUTE ONLY): 28 min  Charges:  $Gait Training: 8-22 mins $Therapeutic Activity: 8-22 mins                       Wyona Almas, PT, DPT Acute Rehabilitation Services Pager [phone] Office [phone]    Deno Etienne 10/25/2019, 4:47 PM

## 2019-10-25 NOTE — Plan of Care (Signed)
  Problem: Education: Goal: Knowledge of General Education information will improve Description: Including pain rating scale, medication(s)/side effects and non-pharmacologic comfort measures Outcome: Progressing   Problem: Activity: Goal: Risk for activity intolerance will decrease Outcome: Progressing   Problem: Pain Managment: Goal: General experience of comfort will improve Outcome: Progressing   

## 2019-10-25 NOTE — Progress Notes (Signed)
Subjective: 5 Days Post-Op Procedure(s) (LRB): REPAIR RIGHT QUADRICEP TENDON (Right) Patient reports pain as mild.   Patient seen in rounds for Dr. Alvan Dame. Patient is well, and has had no acute complaints or problems other than discomfort in the right knee. He reports his pain was worse yesterday, and he feels he may have overdone his activity. He states his foot has been most comfortable when he sleeps with the boot on. His wrist has been doing well and is comfortable in the brace. He is voiding without difficulty. Denies CP, SHOB, N/V. Plan is to go Skilled nursing facility after hospital stay.  Objective: Vital signs in last 24 hours: Temp:  [97.7 F (36.5 C)-98.8 F (37.1 C)] 98 F (36.7 C) (06/06 1156) Pulse Rate:  [52-69] 67 (06/06 1156) Resp:  [12-18] 18 (06/06 1156) BP: (107-131)/(68-80) 107/72 (06/06 1156) SpO2:  [96 %-98 %] 98 % (06/06 1156)  Intake/Output from previous day:  Intake/Output Summary (Last 24 hours) at 10/25/2019 1237 Last data filed at 10/25/2019 0957 Gross per 24 hour  Intake --  Output 900 ml  Net -900 ml    Intake/Output this shift: Total I/O In: -  Out: 200 [Urine:200]  Labs: No results for input(s): HGB in the last 72 hours. No results for input(s): WBC, RBC, HCT, PLT in the last 72 hours. No results for input(s): NA, K, CL, CO2, BUN, CREATININE, GLUCOSE, CALCIUM in the last 72 hours. No results for input(s): LABPT, INR in the last 72 hours.  Exam: General - Patient is Alert and Oriented Extremity - Neurologically intact Sensation intact distally Intact pulses distally Dressing/Incision - clean, dry, no drainage Motor Function - intact, moving toes on exam.  Past Medical History:  Diagnosis Date  . Abnormal liver function test 05/23/2011  . ALLERGIC RHINITIS   . ANXIETY   . Aortic atherosclerosis ([place])   . Arthritis   . Ascending aorta dilation ([facility]) 07/24/2018  . Bilateral renal cysts   . Cervical spondylolysis    Moderate  . CHF  (congestive heart failure) ([place]) 07/24/2018  . Chronic kidney disease    CKD stage 3 per office visit note 08/08/17 on chart   . COLONIC POLYPS, HX OF   . Coronary artery disease   . DEPRESSION   . DIABETES MELLITUS, TYPE II   . Diverticulitis   . Epidermal cyst   . Fatty liver   . GERD   . GOUT   . History of inguinal hernia   . HOH (hard of hearing)    elft ear  . HYPERLIPIDEMIA   . HYPERTENSION   . IBS   . 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 Ischemic cardiomyopathy 07/24/2018  . Left renal mass 05/23/2011  . Lumbar radicular pain 06/03/2011  . Macular degeneration    right eye  . Multinodular goiter   . Pancreatic pseudocyst    Stable  . Peritonsillar abscess   . Pulmonary nodule    Right upper lobe  . Radial neck fracture   . Right upper lobe pulmonary nodule 07/24/2018  . Shortness of breath    occasional shortness of breath  with exertion  . Thyroid disease   . Thyroid nodule    Bilateral  . Vertigo     Assessment/Plan: 5 Days Post-Op Procedure(s) (LRB): REPAIR RIGHT QUADRICEP TENDON (Right) Active Problems:   Patellar tendon rupture   Rupture of right quadriceps tendon  Estimated body mass index  is 33.03 kg/m as calculated from the following:   Height as of this encounter: 5' 7.5" (1.715 m).   Weight as of this encounter: 97.1 kg. Advance diet Up with therapy  DVT Prophylaxis - Aspirin Weight-bearing as tolerated RLE NO ROM of the right knee, remain in bledsoe brace locked in extension Ambulate with right CAM boot NWB LUE with brace to remain in place  Nathan Medina is doing well. He will continue working with therapy while awaiting SNF placement. He will be ready for discharge when placement is obtained as long as he continues to meet goals with PT. Prescriptions printed in chart.   Griffith Citron, PA-C Orthopedic Surgery [phone] 10/25/2019, 12:37 PM

## 2019-10-25 NOTE — Plan of Care (Signed)

## 2019-10-26 ENCOUNTER — Other Ambulatory Visit: Payer: Medicare Other

## 2019-10-26 LAB — SARS CORONAVIRUS 2 (TAT 6-24 HRS): SARS Coronavirus 2: NEGATIVE

## 2019-10-26 LAB — GLUCOSE, CAPILLARY
Glucose-Capillary: 129 mg/dL — ABNORMAL HIGH (ref 70–99)
Glucose-Capillary: 198 mg/dL — ABNORMAL HIGH (ref 70–99)
Glucose-Capillary: 215 mg/dL — ABNORMAL HIGH (ref 70–99)
Glucose-Capillary: 196 mg/dL — ABNORMAL HIGH (ref 70–99)

## 2019-10-26 NOTE — Progress Notes (Signed)
Inpatient Rehabilitation-Admissions Coordinator   Noted pt still in house. Followed up with him this AM to see if he wanted to pursue an expedited appeal. Pt has chosen to pursue SNF at this time. I provided him with the denial letter that was faxed to [place] per his request. All questions answered.   AC will sign off.  Please call if questions.   Raechel Ache, OTR/L  Rehab Admissions Coordinator  [phone] 10/26/2019 11:25 AM

## 2019-10-26 NOTE — Care Management Important Message (Signed)
Important Message  Patient Details  Name: Nathan Medina MRN: [id number] Date of Birth: Jun 20, 1950   Medicare Important Message Given:  Yes     Orbie Pyo 10/26/2019, 1:02 PM

## 2019-10-26 NOTE — TOC Transition Note (Signed)
Transition of Care [facility]) - CM/SW Discharge Note   Patient Details  Name: Nathan Medina MRN: [id number] Date of Birth: 04-14-1951  [facility] CM/SW Contact:  Curlene Labrum, RN Phone Number: 10/26/2019, 4:45 PM   Clinical Narrative:    Case management met with the patient regarding choice for SNF placement after denial for CIR.  The patient has chosen to go to [facility] in [place] but will need to bring Trulicity, Lovaza, Xarelto, and Entresto to the facility by family for acceptance.  The patient is in agreement.  The patient was approved by insurance and ref # is [id number] from 6/7 - 6/9.  Patient will transfer to [facility] tomorrow.  Medications are filled at [facility] in [facility].   Final next level of care: IP Rehab Facility Barriers to Discharge: (waiting on insurance authorization for CIR)   Patient Goals and CMS Choice Patient states their goals for this hospitalization and ongoing recovery are:: Patient lives alone and aims to go to CIR for rehab.  Refusing SNF for now. CMS Medicare.gov Compare Post Acute Care list provided to:: Patient Choice offered to / list presented to : Patient  Discharge Placement                       Discharge Plan and Services   Discharge Planning Services: CM Consult Post Acute Care Choice: IP Rehab                               Social Determinants of Health (SDOH) Interventions     Readmission Risk Interventions Readmission Risk Prevention Plan 10/22/2019  Post Dischage Appt Complete  Medication Screening Complete  Transportation Screening Complete  Some recent data might be hidden

## 2019-10-26 NOTE — Plan of Care (Signed)
  Problem: Education: Goal: Knowledge of General Education information will improve Description: Including pain rating scale, medication(s)/side effects and non-pharmacologic comfort measures Outcome: Progressing   Problem: Activity: Goal: Risk for activity intolerance will decrease Outcome: Progressing   Problem: Pain Managment: Goal: General experience of comfort will improve Outcome: Progressing   

## 2019-10-26 NOTE — Discharge Instructions (Signed)
INSTRUCTIONS AFTER SURGERY  o Remove items at home which could result in a fall. This includes throw rugs or furniture in walking pathways o ICE to the affected joint every three hours while awake for 30 minutes at a time, for at least the first 3-5 days, and then as needed for pain and swelling.  Continue to use ice for pain and swelling. You may notice swelling that will progress down to the foot and ankle.  This is normal after surgery.  Elevate your leg when you are not up walking on it.   o Continue to use the breathing machine you got in the hospital (incentive spirometer) which will help keep your temperature down.  It is common for your temperature to cycle up and down following surgery, especially at night when you are not up moving around and exerting yourself.  The breathing machine keeps your lungs expanded and your temperature down.   DIET:  As you were doing prior to hospitalization, we recommend a well-balanced diet.  DRESSING / WOUND CARE / SHOWERING  Keep the surgical dressing until follow up.  The dressing is water proof, so you can shower without any extra covering.  IF THE DRESSING FALLS OFF or the wound gets wet inside, change the dressing with sterile gauze.  Please use good hand washing techniques before changing the dressing.  Do not use any lotions or creams on the incision until instructed by your surgeon.    ACTIVITY  o Increase activity slowly as tolerated, but follow the weight bearing instructions below.   o No driving for 6 weeks or until further direction given by your physician.  You cannot drive while taking narcotics.  o No lifting or carrying greater than 10 lbs. until further directed by your surgeon. o Avoid periods of inactivity such as sitting longer than an hour when not asleep. This helps prevent blood clots.  o You may return to work once you are authorized by your doctor.   WEIGHT BEARING   Weight bearing as tolerated with assist device (walker,  cane, etc) as directed, use it as long as suggested by your surgeon or therapist, typically at least 4-6 weeks. NO range of motion of the right knee. Remain in full extension.   CONSTIPATION  Constipation is defined medically as fewer than three stools per week and severe constipation as less than one stool per week.  Even if you have a regular bowel pattern at home, your normal regimen is likely to be disrupted due to multiple reasons following surgery.  Combination of anesthesia, postoperative narcotics, change in appetite and fluid intake all can affect your bowels.   YOU MUST use at least one of the following options; they are listed in order of increasing strength to get the job done.  They are all available over the counter, and you may need to use some, POSSIBLY even all of these options:    Drink plenty of fluids (prune juice may be helpful) and high fiber foods Colace 100 mg by mouth twice a day  Senokot for constipation as directed and as needed Dulcolax (bisacodyl), take with full glass of water  Miralax (polyethylene glycol) once or twice a day as needed.  If you have tried all these things and are unable to have a bowel movement in the first 3-4 days after surgery call either your surgeon or your primary doctor.    If you experience loose stools or diarrhea, hold the medications until you stool forms back  up.  If your symptoms do not get better within 1 week or if they get worse, check with your doctor.  If you experience "the worst abdominal pain ever" or develop nausea or vomiting, please contact the office immediately for further recommendations for treatment.   ITCHING:  If you experience itching with your medications, try taking only a single pain pill, or even half a pain pill at a time.  You can also use Benadryl over the counter for itching or also to help with sleep.   TED HOSE STOCKINGS:  Use stockings on both legs until for at least 2 weeks or as directed by physician  office. They may be removed at night for sleeping.  MEDICATIONS:  See your medication summary on the "After Visit Summary" that nursing will review with you.  You may have some home medications which will be placed on hold until you complete the course of blood thinner medication.  It is important for you to complete the blood thinner medication as prescribed.  PRECAUTIONS:  If you experience chest pain or shortness of breath - call 911 immediately for transfer to the hospital emergency department.   If you develop a fever greater that 101 F, purulent drainage from wound, increased redness or drainage from wound, foul odor from the wound/dressing, or calf pain - CONTACT YOUR SURGEON.                                                   FOLLOW-UP APPOINTMENTS:  If you do not already have a post-op appointment, please call the office for an appointment to be seen by your surgeon.  Guidelines for how soon to be seen are listed in your "After Visit Summary", but are typically between 1-4 weeks after surgery.  MAKE SURE YOU:  . Understand these instructions.  . Get help right away if you are not doing well or get worse.    Thank you for letting us be a part of your medical care team.  It is a privilege we respect greatly.  We hope these instructions will help you stay on track for a fast and full recovery!

## 2019-10-26 NOTE — Progress Notes (Signed)
Occupational Therapy Treatment Patient Details Name: Nathan Medina MRN: [id number] DOB: 06/07/1950 Today's Date: 10/26/2019    History of present illness Pt is a 69 y.o. with significant PMH of CHF, CKD, DM type 2, who presents after a fall with right quadriceps tendon rupture s/p repair 10/20/2019, L distal radius fracture, and R 5th MT base fracture.    OT comments  Pt continues to need max assist to manage CAM boot. Donned front opening gown with set up. Pt able to ambulate with min assist and chair following with platform walker into hall. Continues to need moderate assistance to rise into standing. Updated d/c plan to SNF.  Follow Up Recommendations  SNF    Equipment Recommendations  3 in 1 bedside commode;Wheelchair (measurements OT);Wheelchair cushion (measurements OT)    Recommendations for Other Services      Precautions / Restrictions Precautions Precautions: Fall Required Braces or Orthoses: Other Brace Other Brace: R Bledshoe brace at all times, R CAM boot, L wrist splint Restrictions Weight Bearing Restrictions: Yes RUE Weight Bearing: Partial weight bearing RLE Weight Bearing: Weight bearing as tolerated Other Position/Activity Restrictions: LUE NWB (ok to weightbearing through elbow for platform walker)       Mobility Bed Mobility               General bed mobility comments: pt received in chair  Transfers Overall transfer level: Needs assistance Equipment used: Left platform walker Transfers: Sit to/from Stand Sit to Stand: Mod assist         General transfer comment: ModA to rise and steady from recliner, min assist to control descent    Balance Overall balance assessment: Needs assistance   Sitting balance-Leahy Scale: Good     Standing balance support: Bilateral upper extremity supported;During functional activity Standing balance-Leahy Scale: Poor Standing balance comment: reliant on UE support                            ADL either performed or assessed with clinical judgement   ADL Overall ADL's : Needs assistance/impaired                 Upper Body Dressing : Set up;Sitting   Lower Body Dressing: Total assistance;Sitting/lateral leans Lower Body Dressing Details (indicate cue type and reason): to fasten cam boot Toilet Transfer: Minimal assistance;Ambulation;RW   Toileting- Clothing Manipulation and Hygiene: Minimal assistance;Sit to/from stand       Functional mobility during ADLs: Minimal assistance;Rolling walker       Vision       Perception     Praxis      Cognition Arousal/Alertness: Awake/alert Behavior During Therapy: WFL for tasks assessed/performed Overall Cognitive Status: Within Functional Limits for tasks assessed                                          Exercises     Shoulder Instructions       General Comments      Pertinent Vitals/ Pain       Pain Assessment: Faces Faces Pain Scale: Hurts even more Pain Location: R knee Pain Descriptors / Indicators: Grimacing;Guarding Pain Intervention(s): Monitored during session;Repositioned;Premedicated before session;Ice applied  Home Living  Prior Functioning/Environment              Frequency  Min 2X/week        Progress Toward Goals  OT Goals(current goals can now be found in the care plan section)  Progress towards OT goals: Progressing toward goals  Acute Rehab OT Goals Patient Stated Goal: less pain, get stronger OT Goal Formulation: With patient/family Time For Goal Achievement: 11/04/19 Potential to Achieve Goals: Good  Plan Discharge plan needs to be updated    Co-evaluation                 AM-PAC OT "6 Clicks" Daily Activity     Outcome Measure   Help from another person eating meals?: None Help from another person taking care of personal grooming?: A Little Help from another person toileting,  which includes using toliet, bedpan, or urinal?: A Lot Help from another person bathing (including washing, rinsing, drying)?: A Lot Help from another person to put on and taking off regular upper body clothing?: A Little Help from another person to put on and taking off regular lower body clothing?: A Little 6 Click Score: 17    End of Session Equipment Utilized During Treatment: Gait belt;Rolling walker  OT Visit Diagnosis: Unsteadiness on feet (R26.81);Other abnormalities of gait and mobility (R26.89);History of falling (Z91.81);Muscle weakness (generalized) (M62.81);Pain   Activity Tolerance Patient tolerated treatment well   Patient Left in chair;with call bell/phone within reach;with chair alarm set;with family/visitor present   Nurse Communication Mobility status;Precautions;Weight bearing status        Time: 1450-1534 OT Time Calculation (min): 44 min  Charges: OT General Charges $OT Visit: 1 Visit OT Treatments $Self Care/Home Management : 8-22 mins $Therapeutic Activity: 23-37 mins  Nestor Lewandowsky, OTR/L Acute Rehabilitation Services Pager: [phone] Office: [phone]   Malka So 10/26/2019, 3:47 PM

## 2019-10-26 NOTE — Progress Notes (Signed)
     Subjective: 6 Days Post-Op Procedure(s) (LRB): REPAIR RIGHT QUADRICEP TENDON (Right)   Patient reports pain as mild, controlled with medications.  No reported events throughout the night.  He feels that he is doing some things better, but did feel he was walking a little less as of recent.  Discussed that he has no one at home and knows about the insurance denial of CIR. Plans on going to SNF.  Ready to be discharged once available.     Objective:   VITALS:   Vitals:   10/25/19 1948 10/26/19 0408  BP: 122/79 115/74  Pulse: 67 (!) 57  Resp: 12 12  Temp: 98.4 F (36.9 C) 98.1 F (36.7 C)  SpO2: 97% 97%    Dorsiflexion/Plantar flexion intact Incision: dressing C/D/I No cellulitis present Compartment soft  LABS No results for input(s): HGB, HCT, WBC, PLT in the last 72 hours.  No results for input(s): NA, K, BUN, CREATININE, GLUCOSE in the last 72 hours.   Assessment/Plan: 6 Days Post-Op Procedure(s) (LRB): REPAIR RIGHT QUADRICEP TENDON (Right)   Up with therapy Discharge to SNF  Follow up in 2 weeks at [facility] Follow up with OLIN,Elzie Sheets D in 2 weeks.  Contact information:  [facility] [address], [address] [place] [phone]             Danae Orleans PA-C  [facility]  [place] [address], [place], [place], [place] [address] Phone: [phone] [email or web address] Facebook  [employer]

## 2019-10-26 NOTE — Discharge Summary (Addendum)
Patient ID: CAI ANFINSON MRN: [id number] DOB/AGE: 70/29/1952 70 y.o.  Admit date: 10/18/2019 Discharge date: 10/27/2019  Admission Diagnoses:  Active Problems:   Rupture of right quadriceps tendon   Discharge Diagnoses:  Same  Past Medical History:  Diagnosis Date  . Abnormal liver function test 05/23/2011  . ALLERGIC RHINITIS   . ANXIETY   . Aortic atherosclerosis ([place])   . Arthritis   . Ascending aorta dilation (HCC) 07/24/2018  . Bilateral renal cysts   . Cervical spondylolysis    Moderate  . CHF (congestive heart failure) ([place]) 07/24/2018  . Chronic kidney disease    CKD stage 3 per office visit note 08/08/17 on chart   . COLONIC POLYPS, HX OF   . Coronary artery disease   . DEPRESSION   . DIABETES MELLITUS, TYPE II   . Diverticulitis   . Epidermal cyst   . Fatty liver   . GERD   . GOUT   . History of inguinal hernia   . HOH (hard of hearing)    elft ear  . HYPERLIPIDEMIA   . HYPERTENSION   . IBS   . 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 Ischemic cardiomyopathy 07/24/2018  . Left renal mass 05/23/2011  . Lumbar radicular pain 06/03/2011  . Macular degeneration    right eye  . Multinodular goiter   . Pancreatic pseudocyst    Stable  . Peritonsillar abscess   . Pulmonary nodule    Right upper lobe  . Radial neck fracture   . Right upper lobe pulmonary nodule 07/24/2018  . Shortness of breath    occasional shortness of breath  with exertion  . Thyroid disease   . Thyroid nodule    Bilateral  . Vertigo     Surgeries: Procedure(s): REPAIR RIGHT QUADRICEP TENDON on 10/20/2019   Consultants: Treatment Team:  Paralee Cancel, MD  Discharged Condition: Improved  Hospital Course: ARBY DAHIR is an 69 y.o. male who was admitted 10/18/2019 for operative treatment of rupture right quad tendon. Patient has severe unremitting pain that affects sleep, daily activities, and  work/hobbies. After pre-op clearance the patient was taken to the operating room on 10/20/2019 and underwent  Procedure(s): REPAIR RIGHT QUADRICEP TENDON.    Patient was given perioperative antibiotics:  Anti-infectives (From admission, onward)   Start     Dose/Rate Route Frequency Ordered Stop   10/20/19 2200  ceFAZolin (ANCEF) IVPB 2g/100 mL premix     2 g 200 mL/hr over 30 Minutes Intravenous Every 6 hours 10/20/19 1741 10/21/19 0604   10/20/19 0600  ceFAZolin (ANCEF) IVPB 2g/100 mL premix     2 g 200 mL/hr over 30 Minutes Intravenous On call to O.R. 10/19/19 1216 10/20/19 1514   10/19/19 1200  ceFAZolin (ANCEF) IVPB 2g/100 mL premix  Status:  Discontinued     2 g 200 mL/hr over 30 Minutes Intravenous On call to O.R. 10/19/19 1142 10/19/19 1216       Patient was given sequential compression devices, early ambulation, and chemoprophylaxis to prevent DVT.  Patient benefited maximally from hospital stay and there were no complications.    Recent vital signs:  Patient Vitals for the past 24 hrs:  BP Temp Temp src Pulse Resp SpO2  10/27/19 0733 122/77 97.8 F (36.6 C) Oral (!) 57 16 94 %  10/27/19 0300 126/76 97.7 F (36.5 C) Oral 60 16 96 %  10/26/19 1956 127/74 98 F (  36.7 C) Oral 66 17 96 %  10/26/19 1654 120/74 98.3 F (36.8 C) Oral 68 18 99 %     Recent laboratory studies: No results for input(s): WBC, HGB, HCT, PLT, NA, K, CL, CO2, BUN, CREATININE, GLUCOSE, INR, CALCIUM in the last 72 hours.  Invalid input(s): PT, 2   Discharge Medications:   Allergies as of 10/27/2019      Reactions   Ciprofloxacin Rash   Escitalopram Oxalate Itching   Quinapril Hcl Rash   Sulfa Antibiotics Swelling   Swelling in the ankles      Medication List    STOP taking these medications   acetaminophen 325 MG tablet Commonly known as: TYLENOL   aspirin-acetaminophen-caffeine 250-250-65 MG tablet Commonly known as: EXCEDRIN MIGRAINE   ibuprofen 200 MG tablet Commonly known as:  ADVIL   traMADol 50 MG tablet Commonly known as: ULTRAM     TAKE these medications   allopurinol 300 MG tablet Commonly known as: ZYLOPRIM TAKE 1 TABLET BY MOUTH EVERY DAY   ALPRAZolam 0.5 MG tablet Commonly known as: XANAX Take 1 tablet (0.5 mg total) by mouth daily as needed for sleep. 1/2 - 1 by mouth once daily as needed What changed:   how much to take  when to take this  additional instructions   aspirin EC 81 MG tablet Take 81 mg by mouth every other day.   atorvastatin 10 MG tablet Commonly known as: LIPITOR Take 10 mg by mouth at bedtime.   calcium carbonate 500 MG chewable tablet Commonly known as: Tums Chew 2 tablets (400 mg of elemental calcium total) by mouth 2 (two) times daily. What changed:   when to take this  reasons to take this   carvedilol 6.25 MG tablet Commonly known as: COREG Take 1 tablet (6.25 mg total) by mouth 2 (two) times daily.   diphenhydrAMINE 25 MG tablet Commonly known as: BENADRYL Take 25 mg by mouth at bedtime as needed for sleep.   Entresto 49-51 MG Generic drug: sacubitril-valsartan Take 1 tablet by mouth 2 (two) times daily.   furosemide 40 MG tablet Commonly known as: LASIX Take 40 mg by mouth every other day.   guaiFENesin 600 MG 12 hr tablet Commonly known as: MUCINEX Take 1,200 mg by mouth 2 (two) times daily as needed for cough or to loosen phlegm.   HYDROcodone-acetaminophen 5-325 MG tablet Commonly known as: Norco Take 1-2 tablets by mouth every 8 (eight) hours as needed for moderate pain or severe pain.   insulin glargine 100 unit/mL Sopn Commonly known as: LANTUS Inject 44 Units into the skin at bedtime.   levothyroxine 50 MCG tablet Commonly known as: SYNTHROID Take 125 mcg by mouth daily. Taking 2.5 tablets daily   methocarbamol 500 MG tablet Commonly known as: Robaxin Take 1 tablet (500 mg total) by mouth every 6 (six) hours as needed for muscle spasms.   omega-3 acid ethyl esters 1 g  capsule Commonly known as: LOVAZA Take 2 g by mouth 2 (two) times daily.   pantoprazole 40 MG tablet Commonly known as: PROTONIX Take 40 mg by mouth 2 (two) times daily.   PRESERVISION AREDS 2 PO Take 1 tablet by mouth 2 (two) times daily.   sildenafil 20 MG tablet Commonly known as: REVATIO Take 100 mg by mouth daily as needed.   triamcinolone cream 0.1 % Commonly known as: KENALOG Apply 1 application topically 2 (two) times daily as needed (skin irritation).   Trulicity 7.85 YI/5.0YD Sopn Generic drug:  Dulaglutide Inject 0.75 mg into the skin once a week. Monday   Vitamin D-3 125 MCG (5000 UT) Tabs Take 5,000 Units by mouth at bedtime.   Xarelto 2.5 MG Tabs tablet Generic drug: rivaroxaban Take 1 tablet (2.5 mg total) by mouth 2 (two) times daily.            Discharge Care Instructions  (From admission, onward)         Start     Ordered   10/22/19 0000  Change dressing    Comments: Maintain surgical dressing until follow up in the clinic. If the edges start to pull up, may reinforce with tape. If the dressing is no longer working, may remove and cover with gauze and tape, but must keep the area dry and clean.  Call with any questions or concerns.   10/22/19 0831          Diagnostic Studies: DG Wrist Complete Left  Result Date: 10/18/2019 CLINICAL DATA:  Pain after trauma EXAM: LEFT WRIST - COMPLETE 3+ VIEW COMPARISON:  None. FINDINGS: There is a faint lucency through the radial metaphysis with a cortical break seen on the lateral view. These findings are consistent with a subtle radial metaphysis fracture. No other fractures are identified. IMPRESSION: Subtle nondisplaced fracture through the radial metaphysis. Electronically Signed   By: Dorise Bullion III M.D   On: 10/18/2019 10:12   CT Head Wo Contrast  Result Date: 10/18/2019 CLINICAL DATA:  69 year old male with headache and neck pain following fall today. Initial encounter. EXAM: CT HEAD WITHOUT  CONTRAST CT CERVICAL SPINE WITHOUT CONTRAST TECHNIQUE: Multidetector CT imaging of the head and cervical spine was performed following the standard protocol without intravenous contrast. Multiplanar CT image reconstructions of the cervical spine were also generated. COMPARISON:  None. FINDINGS: CT HEAD FINDINGS Brain: No evidence of acute infarction, hemorrhage, hydrocephalus, extra-axial collection or mass lesion/mass effect. Chronic small-vessel white matter ischemic changes are noted. Vascular: Carotid and vertebrobasilar atherosclerotic calcifications are noted. Skull: Normal. Negative for fracture or focal lesion. Sinuses/Orbits: No acute finding. Other: None CT CERVICAL SPINE FINDINGS Alignment: Normal. Skull base and vertebrae: No acute fracture. No primary bone lesion or focal pathologic process. Soft tissues and spinal canal: No prevertebral fluid or swelling. No visible canal hematoma. Disc levels: Moderate degenerative disc disease/spondylosis from C4-C7 identified contributing to mild central spinal and bony foraminal narrowing at these levels. Upper chest: No acute abnormality. Other: A 0.8 x 1 cm RIGHT parotid mass is identified (image 17: Series 4). IMPRESSION: 1. No evidence of acute intracranial abnormality. Chronic small-vessel white matter ischemic changes. 2. No static evidence of acute injury to the cervical spine. Degenerative changes as described. 3. 0.8 x 1 cm RIGHT parotid mass - nonspecific but consider ENT follow-up/consultation. Electronically Signed   By: Margarette Canada M.D.   On: 10/18/2019 10:22   CT Cervical Spine Wo Contrast  Result Date: 10/18/2019 CLINICAL DATA:  69 year old male with headache and neck pain following fall today. Initial encounter. EXAM: CT HEAD WITHOUT CONTRAST CT CERVICAL SPINE WITHOUT CONTRAST TECHNIQUE: Multidetector CT imaging of the head and cervical spine was performed following the standard protocol without intravenous contrast. Multiplanar CT image  reconstructions of the cervical spine were also generated. COMPARISON:  None. FINDINGS: CT HEAD FINDINGS Brain: No evidence of acute infarction, hemorrhage, hydrocephalus, extra-axial collection or mass lesion/mass effect. Chronic small-vessel white matter ischemic changes are noted. Vascular: Carotid and vertebrobasilar atherosclerotic calcifications are noted. Skull: Normal. Negative for fracture or focal lesion.  Sinuses/Orbits: No acute finding. Other: None CT CERVICAL SPINE FINDINGS Alignment: Normal. Skull base and vertebrae: No acute fracture. No primary bone lesion or focal pathologic process. Soft tissues and spinal canal: No prevertebral fluid or swelling. No visible canal hematoma. Disc levels: Moderate degenerative disc disease/spondylosis from C4-C7 identified contributing to mild central spinal and bony foraminal narrowing at these levels. Upper chest: No acute abnormality. Other: A 0.8 x 1 cm RIGHT parotid mass is identified (image 17: Series 4). IMPRESSION: 1. No evidence of acute intracranial abnormality. Chronic small-vessel white matter ischemic changes. 2. No static evidence of acute injury to the cervical spine. Degenerative changes as described. 3. 0.8 x 1 cm RIGHT parotid mass - nonspecific but consider ENT follow-up/consultation. Electronically Signed   By: Margarette Canada M.D.   On: 10/18/2019 10:22   DG Knee Complete 4 Views Right  Result Date: 10/18/2019 CLINICAL DATA:  Pain after fall EXAM: RIGHT KNEE - COMPLETE 4+ VIEW COMPARISON:  None. FINDINGS: The patella is relatively low lying. There is a suprapatellar effusion. There is also significant edema and soft tissue swelling superior to the patella. No other fractures. IMPRESSION: 1. A low lying patella with significant soft tissue swelling superior to the patella raises the possibility of a quadriceps tendon rupture. Recommend clinical correlation. MRI could further assess if clinically ambiguous. 2. Suprapatellar joint effusion. 3. The  visualized fibula, tibia, and femur are intact. No patellar fracture is identified. Electronically Signed   By: Dorise Bullion III M.D   On: 10/18/2019 10:16   DG Foot Complete Right  Result Date: 10/18/2019 CLINICAL DATA:  Pain after trauma EXAM: RIGHT FOOT COMPLETE - 3+ VIEW COMPARISON:  None. FINDINGS: There is a fracture through the base of the fifth metatarsal. IMPRESSION: There is a fracture through the base of the fifth metatarsal. No other abnormalities. Electronically Signed   By: Dorise Bullion III M.D   On: 10/18/2019 10:17    Disposition: Discharge disposition: 03-Skilled [place]       Discharge Instructions    Call MD / Call 911   Complete by: As directed    If you experience chest pain or shortness of breath, CALL 911 and be transported to the hospital emergency room.  If you develope a fever above 101 F, pus (white drainage) or increased drainage or redness at the wound, or calf pain, call your surgeon's office.   Change dressing   Complete by: As directed    Maintain surgical dressing until follow up in the clinic. If the edges start to pull up, may reinforce with tape. If the dressing is no longer working, may remove and cover with gauze and tape, but must keep the area dry and clean.  Call with any questions or concerns.   Constipation Prevention   Complete by: As directed    Drink plenty of fluids.  Prune juice may be helpful.  You may use a stool softener, such as Colace (over the counter) 100 mg twice a day.  Use MiraLax (over the counter) for constipation as needed.   Diet - low sodium heart healthy   Complete by: As directed    Discharge instructions   Complete by: As directed    Maintain surgical dressing until follow up in the clinic. If the edges start to pull up, may reinforce with tape. If the dressing is no longer working, may remove and cover with gauze and tape, but must keep the area dry and clean.  Follow up in 2  weeks at [facility]. Call with  any questions or concerns.   Increase activity slowly as tolerated   Complete by: As directed    Weight bearing as tolerated with assist device (walker, cane, etc) as directed, use it as long as suggested by your surgeon or therapist, typically at least 4-6 weeks right LE.  NWB through the left wrist, ok to use platform walker and WB through the elbow/upper arm.   TED hose   Complete by: As directed    Use stockings (TED hose) for 2 weeks on both leg(s).  You may remove them at night for sleeping.      Follow-up Information    Paralee Cancel, MD. Schedule an appointment as soon as possible for a visit in 2 weeks.   Specialty: Orthopedic Surgery Contact information: [address] [place] [address] [phone]            Signed: Lucille Passy [facility] 10/27/2019, 8:24 AM

## 2019-10-27 DIAGNOSIS — W19XXXA Unspecified fall, initial encounter: Secondary | ICD-10-CM | POA: Diagnosis not present

## 2019-10-27 DIAGNOSIS — F29 Unspecified psychosis not due to a substance or known physiological condition: Secondary | ICD-10-CM | POA: Diagnosis not present

## 2019-10-27 DIAGNOSIS — F322 Major depressive disorder, single episode, severe without psychotic features: Secondary | ICD-10-CM | POA: Diagnosis not present

## 2019-10-27 DIAGNOSIS — I509 Heart failure, unspecified: Secondary | ICD-10-CM | POA: Diagnosis not present

## 2019-10-27 DIAGNOSIS — I10 Essential (primary) hypertension: Secondary | ICD-10-CM | POA: Diagnosis not present

## 2019-10-27 DIAGNOSIS — K219 Gastro-esophageal reflux disease without esophagitis: Secondary | ICD-10-CM | POA: Diagnosis not present

## 2019-10-27 DIAGNOSIS — I2510 Atherosclerotic heart disease of native coronary artery without angina pectoris: Secondary | ICD-10-CM | POA: Diagnosis not present

## 2019-10-27 DIAGNOSIS — S76119A Strain of unspecified quadriceps muscle, fascia and tendon, initial encounter: Secondary | ICD-10-CM | POA: Diagnosis not present

## 2019-10-27 DIAGNOSIS — E119 Type 2 diabetes mellitus without complications: Secondary | ICD-10-CM | POA: Diagnosis not present

## 2019-10-27 DIAGNOSIS — R52 Pain, unspecified: Secondary | ICD-10-CM | POA: Diagnosis not present

## 2019-10-27 DIAGNOSIS — E039 Hypothyroidism, unspecified: Secondary | ICD-10-CM | POA: Diagnosis not present

## 2019-10-27 DIAGNOSIS — I700 Atherosclerosis of aorta: Secondary | ICD-10-CM | POA: Diagnosis not present

## 2019-10-27 DIAGNOSIS — Z79899 Other long term (current) drug therapy: Secondary | ICD-10-CM | POA: Diagnosis not present

## 2019-10-27 DIAGNOSIS — S92354D Nondisplaced fracture of fifth metatarsal bone, right foot, subsequent encounter for fracture with routine healing: Secondary | ICD-10-CM | POA: Diagnosis not present

## 2019-10-27 DIAGNOSIS — Z7401 Bed confinement status: Secondary | ICD-10-CM | POA: Diagnosis not present

## 2019-10-27 DIAGNOSIS — M2550 Pain in unspecified joint: Secondary | ICD-10-CM | POA: Diagnosis not present

## 2019-10-27 DIAGNOSIS — S52502D Unspecified fracture of the lower end of left radius, subsequent encounter for closed fracture with routine healing: Secondary | ICD-10-CM | POA: Diagnosis not present

## 2019-10-27 DIAGNOSIS — D649 Anemia, unspecified: Secondary | ICD-10-CM | POA: Diagnosis not present

## 2019-10-27 DIAGNOSIS — S52509A Unspecified fracture of the lower end of unspecified radius, initial encounter for closed fracture: Secondary | ICD-10-CM | POA: Diagnosis not present

## 2019-10-27 DIAGNOSIS — R69 Illness, unspecified: Secondary | ICD-10-CM | POA: Diagnosis not present

## 2019-10-27 DIAGNOSIS — S76111D Strain of right quadriceps muscle, fascia and tendon, subsequent encounter: Secondary | ICD-10-CM | POA: Diagnosis not present

## 2019-10-27 DIAGNOSIS — M109 Gout, unspecified: Secondary | ICD-10-CM | POA: Diagnosis not present

## 2019-10-27 LAB — GLUCOSE, CAPILLARY
Glucose-Capillary: 108 mg/dL — ABNORMAL HIGH (ref 70–99)
Glucose-Capillary: 166 mg/dL — ABNORMAL HIGH (ref 70–99)
Glucose-Capillary: 169 mg/dL — ABNORMAL HIGH (ref 70–99)

## 2019-10-27 NOTE — Progress Notes (Signed)
     Subjective: 7 Days Post-Op Procedure(s) (LRB): REPAIR RIGHT QUADRICEP TENDON (Right)   Patient reports pain as mild, pain controlled. Mobilizing well with staff to get to the bathroom. No reported events throughout the night.  Ready to be discharged to SNF when available.     Objective:   VITALS:   Vitals:   10/27/19 0300 10/27/19 0733  BP: 126/76 122/77  Pulse: 60 (!) 57  Resp: 16 16  Temp: 97.7 F (36.5 C) 97.8 F (36.6 C)  SpO2: 96% 94%    Right LE Dorsiflexion/Plantar flexion intact Incision: dressing C/D/I No cellulitis present Compartment soft Boot with ambulation  Left wrist Wrist splint in place  LABS No results for input(s): HGB, HCT, WBC, PLT in the last 72 hours.  No results for input(s): NA, K, BUN, CREATININE, GLUCOSE in the last 72 hours.   Assessment/Plan: 7 Days Post-Op Procedure(s) (LRB): REPAIR RIGHT QUADRICEP TENDON (Right)   Up with therapy Discharge to SNF  Follow up in 2 weeks at [facility] Follow up with OLIN,MATTHEW D in 2 weeks.  Contact information:  [facility] [address] [place] [phone]             Danae Orleans PA-C  [facility]  [place] [address] [place], [place], [place] [address] Phone: [phone] [email or web address] Facebook  [employer]

## 2019-10-27 NOTE — Progress Notes (Signed)
Pt report given to Ashley,receiving RN for [facility]; pt is going to rm 210

## 2019-10-27 NOTE — NC FL2 (Signed)
[place] LEVEL OF CARE SCREENING TOOL     IDENTIFICATION  Patient Name: Nathan Medina Birthdate: 06-20-1950 Sex: male Admission Date (Current Location): 10/18/2019  [facility] and [place] Number:  Herbalist and Address:  [facility] [place] [place], [place] [address] [place], [place] [address]      Provider Number: [id number]  Attending Physician Name and Address:  Paralee Cancel, MD  Relative Name and Phone Number:  Baraa Tubbs, sister [phone]    Current Level of Care: Hospital Recommended Level of Care: [place] Prior Approval Number:    Date Approved/Denied:   PASRR Number: [id number]  Discharge Plan: SNF    Current Diagnoses: Patient Active Problem List   Diagnosis Date Noted  . Patellar tendon rupture 10/18/2019  . Rupture of right quadriceps tendon 10/18/2019  . Ischemic cardiomyopathy 07/24/2018  . Right upper lobe pulmonary nodule 07/24/2018  . CHF (congestive heart failure) ([place]) 07/24/2018  . Ascending aorta dilation (HCC) 07/24/2018  . 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 Coronary artery disease 01/15/2018  . S/P thyroidectomy 01/15/2018  . S/P cardiac cath 01/14/2018  . Abnormal stress test 01/12/2018  . 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 Multiple thyroid nodules 10/16/2017  . Peritonsillar abscess 08/01/2016  . Renal failure (ARF), acute on chronic (HCC) 08/01/2016  . Tonsil, abscess 07/31/2016  . Back pain 04/03/2012  . Ventral hernia 10/22/2011  . Lumbar radicular pain 06/03/2011  . PSA elevation 06/01/2011  . Left renal mass 05/23/2011  . Abnormal liver function test 05/23/2011  . Rash 04/02/2011  . Hearing loss 11/28/2010  . Preventative health care 09/17/2010  . 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 PERIPHERAL EDEMA 11/04/2009  . Epidermal cyst 08/01/2007  . Type 2 diabetes mellitus with renal manifestations ([place]) 06/25/2007  . HYPERLIPIDEMIA 06/25/2007  . GOUT  06/25/2007  . ANXIETY 06/25/2007  . DEPRESSION 06/25/2007  . Essential hypertension 06/25/2007  . ALLERGIC RHINITIS 06/25/2007  . GERD 06/25/2007  . IBS 06/25/2007  . COLONIC POLYPS, HX OF 06/25/2007    Orientation RESPIRATION BLADDER Height & Weight     Self, Time, Situation, Place  Normal Continent Weight: 97.1 kg Height:  5' 7.5" (171.5 cm)  BEHAVIORAL SYMPTOMS/MOOD NEUROLOGICAL BOWEL NUTRITION STATUS      Continent Diet(see discharge summary)  AMBULATORY STATUS COMMUNICATION OF NEEDS Skin   Extensive Assist Verbally Surgical wounds                       Personal Care Assistance Level of Assistance  Bathing, Dressing Bathing Assistance: Limited assistance   Dressing Assistance: Limited assistance     Functional Limitations Info  Sight, Hearing, Speech Sight Info: Adequate Hearing Info: Adequate Speech Info: Adequate    SPECIAL CARE FACTORS FREQUENCY  PT (By licensed PT), OT (By licensed OT)     PT Frequency: 5 x per week OT Frequency: 5 x per week            Contractures Contractures Info: Not present    Additional Factors Info  Code Status, Allergies, Insulin Sliding Scale Code Status Info: Full Allergies Info: cipro,Escitalopram Oxalate, quinapril, sulga           Current Medications (10/27/2019):  This is the current hospital active medication list Current Facility-Administered Medications  Medication Dose Route Frequency Provider Last Rate Last Admin  . 0.9 %  sodium chloride infusion   Intravenous Continuous Paralee Cancel, MD 50 mL/hr at 10/18/19  [place] at 10/18/19 1848  . 0.9 %  sodium chloride infusion   Intravenous Continuous Maurice March, PA-C 75 mL/hr at 10/20/19 1816 New Bag at 10/20/19 1816  . acetaminophen (TYLENOL) tablet 325-650 mg  325-650 mg Oral Q6H PRN Maurice March, PA-C   650 mg at 10/27/19 0820  . allopurinol (ZYLOPRIM) tablet 300 mg  300 mg Oral Daily Maurice March, PA-C   300 mg at 10/27/19 0820  .  ALPRAZolam Duanne Moron) tablet 0.25-0.5 mg  0.25-0.5 mg Oral QHS PRN Maurice March, PA-C      . alum & mag hydroxide-simeth (MAALOX/MYLANTA) 200-200-20 MG/5ML suspension 15 mL  15 mL Oral Q4H PRN Maurice March, PA-C      . atorvastatin (LIPITOR) tablet 10 mg  10 mg Oral QHS Maurice March, PA-C   10 mg at 10/26/19 2200  . bisacodyl (DULCOLAX) suppository 10 mg  10 mg Rectal Daily PRN Maurice March, PA-C      . carvedilol (COREG) tablet 6.25 mg  6.25 mg Oral BID Maurice March, PA-C   6.25 mg at 10/27/19 0820  . diphenhydrAMINE (BENADRYL) 12.5 MG/5ML elixir 12.5-25 mg  12.5-25 mg Oral Q4H PRN Maurice March, PA-C      . docusate sodium (COLACE) capsule 100 mg  100 mg Oral BID Maurice March, PA-C   100 mg at 10/27/19 0820  . ferrous sulfate tablet 325 mg  325 mg Oral BID WC Maurice March, PA-C   325 mg at 10/27/19 4765  . furosemide (LASIX) tablet 40 mg  40 mg Oral QODAY Maurice March, PA-C   40 mg at 10/26/19 0910  . guaiFENesin (MUCINEX) 12 hr tablet 1,200 mg  1,200 mg Oral BID PRN Maurice March, PA-C      . HYDROcodone-acetaminophen (NORCO) 7.5-325 MG per tablet 1-2 tablet  1-2 tablet Oral Q4H PRN Maurice March, PA-C   1 tablet at 10/25/19 1511  . HYDROcodone-acetaminophen (NORCO/VICODIN) 5-325 MG per tablet 1-2 tablet  1-2 tablet Oral Q4H PRN Maurice March, PA-C   1 tablet at 10/26/19 0656  . insulin aspart (novoLOG) injection 0-15 Units  0-15 Units Subcutaneous TID WC Maurice March, PA-C   5 Units at 10/26/19 1741  . insulin glargine (LANTUS) injection 44 Units  44 Units Subcutaneous QHS Maurice March, [place]   44 Units at 10/26/19 2202  . lactated ringers infusion   Intravenous Continuous [place], Karyl Kinnier, MD 10 mL/hr at 10/20/19 1345 Restarted at 10/20/19 1607  . levothyroxine (SYNTHROID) tablet 125 mcg  125 mcg Oral Daily Maurice March, [place]   125 mcg at 10/27/19 4650  . magnesium citrate solution 1 Bottle  1 Bottle Oral Once PRN Maurice March, PA-C      . menthol-cetylpyridinium (CEPACOL) lozenge 3 mg  1 lozenge Oral PRN Griffith Citron R, PA-C       Or  . phenol (CHLORASEPTIC) mouth spray 1 spray  1 spray Mouth/Throat PRN Maurice March, PA-C      . methocarbamol (ROBAXIN) tablet 500 mg  500 mg Oral Q6H PRN Maurice March, PA-C   500 mg at 10/26/19 1543   Or  . methocarbamol (ROBAXIN) 500 mg in dextrose 5 % 50 mL IVPB  500 mg Intravenous Q6H PRN Maurice March, PA-C      . metoCLOPramide (REGLAN) tablet 5-10 mg  5-10 mg Oral Q8H PRN Maurice March, PA-C  Or  . metoCLOPramide (REGLAN) injection 5-10 mg  5-10 mg Intravenous Q8H PRN Maurice March, PA-C      . morphine 2 MG/ML injection 0.5-1 mg  0.5-1 mg Intravenous Q2H PRN Maurice March, PA-C      . ondansetron (ZOFRAN) tablet 4 mg  4 mg Oral Q6H PRN Maurice March, PA-C       Or  . ondansetron (ZOFRAN) injection 4 mg  4 mg Intravenous Q6H PRN Maurice March, PA-C      . pantoprazole (PROTONIX) EC tablet 40 mg  40 mg Oral BID Maurice March, PA-C   40 mg at 10/27/19 0820  . polyethylene glycol (MIRALAX / GLYCOLAX) packet 17 g  17 g Oral BID Maurice March, PA-C   17 g at 10/26/19 0848  . rivaroxaban (XARELTO) tablet 2.5 mg  2.5 mg Oral BID Maurice March, PA-C   2.5 mg at 10/27/19 6606  . sacubitril-valsartan (ENTRESTO) 49-51 mg per tablet  1 tablet Oral BID Maurice March, [place]   1 tablet at 10/27/19 3016     Discharge Medications: Please see discharge summary for a list of discharge medications.  Relevant Imaging Results:  Relevant Lab Results:   Additional Information SS# [id number]  Curlene Labrum, RN

## 2019-10-27 NOTE — TOC Transition Note (Signed)
Transition of Care [facility]) - CM/SW Discharge Note   Patient Details  Name: Nathan Medina MRN: [id number] Date of Birth: May 04, 1951  [facility]) CM/SW Contact:  Curlene Labrum, RN Phone Number: 10/27/2019, 11:33 AM   Clinical Narrative:    Case management met with the patient and confirmed with the patient for patient's transfer to [facility] today.  I called [facility] and the patient will be going to room 210 and report to be called to [phone].  PTAR was coordinated for discharge at 2 pm today.  A copy of the FL2 and clinicals were sent to [facility].   Final next level of care: IP Rehab Facility Barriers to Discharge: (waiting on insurance authorization for CIR)   Patient Goals and CMS Choice Patient states their goals for this hospitalization and ongoing recovery are:: Patient lives alone and aims to go to CIR for rehab.  Refusing SNF for now. CMS Medicare.gov Compare Post Acute Care list provided to:: Patient Choice offered to / list presented to : Patient  Discharge Placement                       Discharge Plan and Services   Discharge Planning Services: CM Consult Post Acute Care Choice: IP Rehab                               Social Determinants of Health (SDOH) Interventions     Readmission Risk Interventions Readmission Risk Prevention Plan 10/22/2019  Post Dischage Appt Complete  Medication Screening Complete  Transportation Screening Complete  Some recent data might be hidden

## 2019-10-28 DIAGNOSIS — F322 Major depressive disorder, single episode, severe without psychotic features: Secondary | ICD-10-CM | POA: Diagnosis not present

## 2019-10-28 DIAGNOSIS — I700 Atherosclerosis of aorta: Secondary | ICD-10-CM | POA: Diagnosis not present

## 2019-10-28 DIAGNOSIS — S76119A Strain of unspecified quadriceps muscle, fascia and tendon, initial encounter: Secondary | ICD-10-CM | POA: Diagnosis not present

## 2019-10-28 DIAGNOSIS — I509 Heart failure, unspecified: Secondary | ICD-10-CM | POA: Diagnosis not present

## 2019-11-05 ENCOUNTER — Ambulatory Visit: Payer: Medicare Other | Admitting: Cardiology

## 2019-11-14 DIAGNOSIS — F322 Major depressive disorder, single episode, severe without psychotic features: Secondary | ICD-10-CM | POA: Diagnosis not present

## 2019-11-14 DIAGNOSIS — S76119A Strain of unspecified quadriceps muscle, fascia and tendon, initial encounter: Secondary | ICD-10-CM | POA: Diagnosis not present

## 2019-11-14 DIAGNOSIS — I700 Atherosclerosis of aorta: Secondary | ICD-10-CM | POA: Diagnosis not present

## 2019-11-14 DIAGNOSIS — S52509A Unspecified fracture of the lower end of unspecified radius, initial encounter for closed fracture: Secondary | ICD-10-CM | POA: Diagnosis not present

## 2019-11-15 DIAGNOSIS — I509 Heart failure, unspecified: Secondary | ICD-10-CM | POA: Diagnosis not present

## 2019-11-15 DIAGNOSIS — I700 Atherosclerosis of aorta: Secondary | ICD-10-CM | POA: Diagnosis not present

## 2019-11-15 DIAGNOSIS — S92351D Displaced fracture of fifth metatarsal bone, right foot, subsequent encounter for fracture with routine healing: Secondary | ICD-10-CM | POA: Diagnosis not present

## 2019-11-15 DIAGNOSIS — I255 Ischemic cardiomyopathy: Secondary | ICD-10-CM | POA: Diagnosis not present

## 2019-11-15 DIAGNOSIS — S52502D Unspecified fracture of the lower end of left radius, subsequent encounter for closed fracture with routine healing: Secondary | ICD-10-CM | POA: Diagnosis not present

## 2019-11-15 DIAGNOSIS — I2510 Atherosclerotic heart disease of native coronary artery without angina pectoris: Secondary | ICD-10-CM | POA: Diagnosis not present

## 2019-11-15 DIAGNOSIS — I130 Hypertensive heart and chronic kidney disease with heart failure and stage 1 through stage 4 chronic kidney disease, or unspecified chronic kidney disease: Secondary | ICD-10-CM | POA: Diagnosis not present

## 2019-11-15 DIAGNOSIS — E1122 Type 2 diabetes mellitus with diabetic chronic kidney disease: Secondary | ICD-10-CM | POA: Diagnosis not present

## 2019-11-15 DIAGNOSIS — S76111D Strain of right quadriceps muscle, fascia and tendon, subsequent encounter: Secondary | ICD-10-CM | POA: Diagnosis not present

## 2019-11-17 DIAGNOSIS — I130 Hypertensive heart and chronic kidney disease with heart failure and stage 1 through stage 4 chronic kidney disease, or unspecified chronic kidney disease: Secondary | ICD-10-CM | POA: Diagnosis not present

## 2019-11-17 DIAGNOSIS — S92351D Displaced fracture of fifth metatarsal bone, right foot, subsequent encounter for fracture with routine healing: Secondary | ICD-10-CM | POA: Diagnosis not present

## 2019-11-17 DIAGNOSIS — S52502D Unspecified fracture of the lower end of left radius, subsequent encounter for closed fracture with routine healing: Secondary | ICD-10-CM | POA: Diagnosis not present

## 2019-11-17 DIAGNOSIS — I255 Ischemic cardiomyopathy: Secondary | ICD-10-CM | POA: Diagnosis not present

## 2019-11-17 DIAGNOSIS — E1122 Type 2 diabetes mellitus with diabetic chronic kidney disease: Secondary | ICD-10-CM | POA: Diagnosis not present

## 2019-11-17 DIAGNOSIS — I2510 Atherosclerotic heart disease of native coronary artery without angina pectoris: Secondary | ICD-10-CM | POA: Diagnosis not present

## 2019-11-17 DIAGNOSIS — I509 Heart failure, unspecified: Secondary | ICD-10-CM | POA: Diagnosis not present

## 2019-11-17 DIAGNOSIS — I700 Atherosclerosis of aorta: Secondary | ICD-10-CM | POA: Diagnosis not present

## 2019-11-17 DIAGNOSIS — S76111D Strain of right quadriceps muscle, fascia and tendon, subsequent encounter: Secondary | ICD-10-CM | POA: Diagnosis not present

## 2019-11-19 DIAGNOSIS — I509 Heart failure, unspecified: Secondary | ICD-10-CM | POA: Diagnosis not present

## 2019-11-19 DIAGNOSIS — I130 Hypertensive heart and chronic kidney disease with heart failure and stage 1 through stage 4 chronic kidney disease, or unspecified chronic kidney disease: Secondary | ICD-10-CM | POA: Diagnosis not present

## 2019-11-19 DIAGNOSIS — S92351D Displaced fracture of fifth metatarsal bone, right foot, subsequent encounter for fracture with routine healing: Secondary | ICD-10-CM | POA: Diagnosis not present

## 2019-11-19 DIAGNOSIS — I2510 Atherosclerotic heart disease of native coronary artery without angina pectoris: Secondary | ICD-10-CM | POA: Diagnosis not present

## 2019-11-19 DIAGNOSIS — S52502D Unspecified fracture of the lower end of left radius, subsequent encounter for closed fracture with routine healing: Secondary | ICD-10-CM | POA: Diagnosis not present

## 2019-11-19 DIAGNOSIS — S76111D Strain of right quadriceps muscle, fascia and tendon, subsequent encounter: Secondary | ICD-10-CM | POA: Diagnosis not present

## 2019-11-19 DIAGNOSIS — E1122 Type 2 diabetes mellitus with diabetic chronic kidney disease: Secondary | ICD-10-CM | POA: Diagnosis not present

## 2019-11-19 DIAGNOSIS — I700 Atherosclerosis of aorta: Secondary | ICD-10-CM | POA: Diagnosis not present

## 2019-11-19 DIAGNOSIS — I255 Ischemic cardiomyopathy: Secondary | ICD-10-CM | POA: Diagnosis not present

## 2019-11-20 DIAGNOSIS — S76111D Strain of right quadriceps muscle, fascia and tendon, subsequent encounter: Secondary | ICD-10-CM | POA: Diagnosis not present

## 2019-11-20 DIAGNOSIS — S92351D Displaced fracture of fifth metatarsal bone, right foot, subsequent encounter for fracture with routine healing: Secondary | ICD-10-CM | POA: Diagnosis not present

## 2019-11-20 DIAGNOSIS — I255 Ischemic cardiomyopathy: Secondary | ICD-10-CM | POA: Diagnosis not present

## 2019-11-20 DIAGNOSIS — S52502D Unspecified fracture of the lower end of left radius, subsequent encounter for closed fracture with routine healing: Secondary | ICD-10-CM | POA: Diagnosis not present

## 2019-11-20 DIAGNOSIS — I130 Hypertensive heart and chronic kidney disease with heart failure and stage 1 through stage 4 chronic kidney disease, or unspecified chronic kidney disease: Secondary | ICD-10-CM | POA: Diagnosis not present

## 2019-11-20 DIAGNOSIS — I509 Heart failure, unspecified: Secondary | ICD-10-CM | POA: Diagnosis not present

## 2019-11-20 DIAGNOSIS — I2510 Atherosclerotic heart disease of native coronary artery without angina pectoris: Secondary | ICD-10-CM | POA: Diagnosis not present

## 2019-11-20 DIAGNOSIS — E1122 Type 2 diabetes mellitus with diabetic chronic kidney disease: Secondary | ICD-10-CM | POA: Diagnosis not present

## 2019-11-20 DIAGNOSIS — I700 Atherosclerosis of aorta: Secondary | ICD-10-CM | POA: Diagnosis not present

## 2019-11-23 DIAGNOSIS — I255 Ischemic cardiomyopathy: Secondary | ICD-10-CM | POA: Diagnosis not present

## 2019-11-23 DIAGNOSIS — S76111D Strain of right quadriceps muscle, fascia and tendon, subsequent encounter: Secondary | ICD-10-CM | POA: Diagnosis not present

## 2019-11-23 DIAGNOSIS — I130 Hypertensive heart and chronic kidney disease with heart failure and stage 1 through stage 4 chronic kidney disease, or unspecified chronic kidney disease: Secondary | ICD-10-CM | POA: Diagnosis not present

## 2019-11-23 DIAGNOSIS — S92351D Displaced fracture of fifth metatarsal bone, right foot, subsequent encounter for fracture with routine healing: Secondary | ICD-10-CM | POA: Diagnosis not present

## 2019-11-23 DIAGNOSIS — I700 Atherosclerosis of aorta: Secondary | ICD-10-CM | POA: Diagnosis not present

## 2019-11-23 DIAGNOSIS — E1122 Type 2 diabetes mellitus with diabetic chronic kidney disease: Secondary | ICD-10-CM | POA: Diagnosis not present

## 2019-11-23 DIAGNOSIS — S52502D Unspecified fracture of the lower end of left radius, subsequent encounter for closed fracture with routine healing: Secondary | ICD-10-CM | POA: Diagnosis not present

## 2019-11-23 DIAGNOSIS — I509 Heart failure, unspecified: Secondary | ICD-10-CM | POA: Diagnosis not present

## 2019-11-23 DIAGNOSIS — I2510 Atherosclerotic heart disease of native coronary artery without angina pectoris: Secondary | ICD-10-CM | POA: Diagnosis not present

## 2019-11-24 DIAGNOSIS — I700 Atherosclerosis of aorta: Secondary | ICD-10-CM | POA: Diagnosis not present

## 2019-11-24 DIAGNOSIS — I130 Hypertensive heart and chronic kidney disease with heart failure and stage 1 through stage 4 chronic kidney disease, or unspecified chronic kidney disease: Secondary | ICD-10-CM | POA: Diagnosis not present

## 2019-11-24 DIAGNOSIS — I2510 Atherosclerotic heart disease of native coronary artery without angina pectoris: Secondary | ICD-10-CM | POA: Diagnosis not present

## 2019-11-24 DIAGNOSIS — S92351D Displaced fracture of fifth metatarsal bone, right foot, subsequent encounter for fracture with routine healing: Secondary | ICD-10-CM | POA: Diagnosis not present

## 2019-11-24 DIAGNOSIS — S52502D Unspecified fracture of the lower end of left radius, subsequent encounter for closed fracture with routine healing: Secondary | ICD-10-CM | POA: Diagnosis not present

## 2019-11-24 DIAGNOSIS — I255 Ischemic cardiomyopathy: Secondary | ICD-10-CM | POA: Diagnosis not present

## 2019-11-24 DIAGNOSIS — I509 Heart failure, unspecified: Secondary | ICD-10-CM | POA: Diagnosis not present

## 2019-11-24 DIAGNOSIS — E1122 Type 2 diabetes mellitus with diabetic chronic kidney disease: Secondary | ICD-10-CM | POA: Diagnosis not present

## 2019-11-24 DIAGNOSIS — S76111D Strain of right quadriceps muscle, fascia and tendon, subsequent encounter: Secondary | ICD-10-CM | POA: Diagnosis not present

## 2019-11-25 DIAGNOSIS — Z4789 Encounter for other orthopedic aftercare: Secondary | ICD-10-CM | POA: Diagnosis not present

## 2019-11-25 DIAGNOSIS — S92351D Displaced fracture of fifth metatarsal bone, right foot, subsequent encounter for fracture with routine healing: Secondary | ICD-10-CM | POA: Diagnosis not present

## 2019-11-25 DIAGNOSIS — I2510 Atherosclerotic heart disease of native coronary artery without angina pectoris: Secondary | ICD-10-CM | POA: Diagnosis not present

## 2019-11-25 DIAGNOSIS — I509 Heart failure, unspecified: Secondary | ICD-10-CM | POA: Diagnosis not present

## 2019-11-25 DIAGNOSIS — S52502D Unspecified fracture of the lower end of left radius, subsequent encounter for closed fracture with routine healing: Secondary | ICD-10-CM | POA: Diagnosis not present

## 2019-11-25 DIAGNOSIS — S76111D Strain of right quadriceps muscle, fascia and tendon, subsequent encounter: Secondary | ICD-10-CM | POA: Diagnosis not present

## 2019-11-25 DIAGNOSIS — E1122 Type 2 diabetes mellitus with diabetic chronic kidney disease: Secondary | ICD-10-CM | POA: Diagnosis not present

## 2019-11-25 DIAGNOSIS — I700 Atherosclerosis of aorta: Secondary | ICD-10-CM | POA: Diagnosis not present

## 2019-11-25 DIAGNOSIS — I130 Hypertensive heart and chronic kidney disease with heart failure and stage 1 through stage 4 chronic kidney disease, or unspecified chronic kidney disease: Secondary | ICD-10-CM | POA: Diagnosis not present

## 2019-11-25 DIAGNOSIS — I255 Ischemic cardiomyopathy: Secondary | ICD-10-CM | POA: Diagnosis not present

## 2019-11-27 DIAGNOSIS — S52502D Unspecified fracture of the lower end of left radius, subsequent encounter for closed fracture with routine healing: Secondary | ICD-10-CM | POA: Diagnosis not present

## 2019-11-27 DIAGNOSIS — I130 Hypertensive heart and chronic kidney disease with heart failure and stage 1 through stage 4 chronic kidney disease, or unspecified chronic kidney disease: Secondary | ICD-10-CM | POA: Diagnosis not present

## 2019-11-27 DIAGNOSIS — E1122 Type 2 diabetes mellitus with diabetic chronic kidney disease: Secondary | ICD-10-CM | POA: Diagnosis not present

## 2019-11-27 DIAGNOSIS — I700 Atherosclerosis of aorta: Secondary | ICD-10-CM | POA: Diagnosis not present

## 2019-11-27 DIAGNOSIS — I509 Heart failure, unspecified: Secondary | ICD-10-CM | POA: Diagnosis not present

## 2019-11-27 DIAGNOSIS — I255 Ischemic cardiomyopathy: Secondary | ICD-10-CM | POA: Diagnosis not present

## 2019-11-27 DIAGNOSIS — S92351D Displaced fracture of fifth metatarsal bone, right foot, subsequent encounter for fracture with routine healing: Secondary | ICD-10-CM | POA: Diagnosis not present

## 2019-11-27 DIAGNOSIS — S76111D Strain of right quadriceps muscle, fascia and tendon, subsequent encounter: Secondary | ICD-10-CM | POA: Diagnosis not present

## 2019-11-27 DIAGNOSIS — I2510 Atherosclerotic heart disease of native coronary artery without angina pectoris: Secondary | ICD-10-CM | POA: Diagnosis not present

## 2019-11-30 DIAGNOSIS — I255 Ischemic cardiomyopathy: Secondary | ICD-10-CM | POA: Diagnosis not present

## 2019-11-30 DIAGNOSIS — S92351D Displaced fracture of fifth metatarsal bone, right foot, subsequent encounter for fracture with routine healing: Secondary | ICD-10-CM | POA: Diagnosis not present

## 2019-11-30 DIAGNOSIS — S76111D Strain of right quadriceps muscle, fascia and tendon, subsequent encounter: Secondary | ICD-10-CM | POA: Diagnosis not present

## 2019-11-30 DIAGNOSIS — I130 Hypertensive heart and chronic kidney disease with heart failure and stage 1 through stage 4 chronic kidney disease, or unspecified chronic kidney disease: Secondary | ICD-10-CM | POA: Diagnosis not present

## 2019-11-30 DIAGNOSIS — I2510 Atherosclerotic heart disease of native coronary artery without angina pectoris: Secondary | ICD-10-CM | POA: Diagnosis not present

## 2019-11-30 DIAGNOSIS — I700 Atherosclerosis of aorta: Secondary | ICD-10-CM | POA: Diagnosis not present

## 2019-11-30 DIAGNOSIS — I509 Heart failure, unspecified: Secondary | ICD-10-CM | POA: Diagnosis not present

## 2019-11-30 DIAGNOSIS — S52502D Unspecified fracture of the lower end of left radius, subsequent encounter for closed fracture with routine healing: Secondary | ICD-10-CM | POA: Diagnosis not present

## 2019-11-30 DIAGNOSIS — E1122 Type 2 diabetes mellitus with diabetic chronic kidney disease: Secondary | ICD-10-CM | POA: Diagnosis not present

## 2019-12-01 DIAGNOSIS — E785 Hyperlipidemia, unspecified: Secondary | ICD-10-CM | POA: Diagnosis not present

## 2019-12-01 DIAGNOSIS — E039 Hypothyroidism, unspecified: Secondary | ICD-10-CM | POA: Diagnosis not present

## 2019-12-01 DIAGNOSIS — K862 Cyst of pancreas: Secondary | ICD-10-CM | POA: Diagnosis not present

## 2019-12-01 DIAGNOSIS — R918 Other nonspecific abnormal finding of lung field: Secondary | ICD-10-CM | POA: Diagnosis not present

## 2019-12-01 DIAGNOSIS — K118 Other diseases of salivary glands: Secondary | ICD-10-CM | POA: Diagnosis not present

## 2019-12-01 DIAGNOSIS — N390 Urinary tract infection, site not specified: Secondary | ICD-10-CM | POA: Diagnosis not present

## 2019-12-01 DIAGNOSIS — E1122 Type 2 diabetes mellitus with diabetic chronic kidney disease: Secondary | ICD-10-CM | POA: Diagnosis not present

## 2019-12-01 DIAGNOSIS — N2889 Other specified disorders of kidney and ureter: Secondary | ICD-10-CM | POA: Diagnosis not present

## 2019-12-01 DIAGNOSIS — I10 Essential (primary) hypertension: Secondary | ICD-10-CM | POA: Diagnosis not present

## 2019-12-01 DIAGNOSIS — Z125 Encounter for screening for malignant neoplasm of prostate: Secondary | ICD-10-CM | POA: Diagnosis not present

## 2019-12-02 DIAGNOSIS — E1122 Type 2 diabetes mellitus with diabetic chronic kidney disease: Secondary | ICD-10-CM | POA: Diagnosis not present

## 2019-12-02 DIAGNOSIS — S76111D Strain of right quadriceps muscle, fascia and tendon, subsequent encounter: Secondary | ICD-10-CM | POA: Diagnosis not present

## 2019-12-02 DIAGNOSIS — I130 Hypertensive heart and chronic kidney disease with heart failure and stage 1 through stage 4 chronic kidney disease, or unspecified chronic kidney disease: Secondary | ICD-10-CM | POA: Diagnosis not present

## 2019-12-02 DIAGNOSIS — I255 Ischemic cardiomyopathy: Secondary | ICD-10-CM | POA: Diagnosis not present

## 2019-12-02 DIAGNOSIS — Z8781 Personal history of (healed) traumatic fracture: Secondary | ICD-10-CM | POA: Diagnosis not present

## 2019-12-02 DIAGNOSIS — Z85850 Personal history of malignant neoplasm of thyroid: Secondary | ICD-10-CM | POA: Diagnosis not present

## 2019-12-02 DIAGNOSIS — E559 Vitamin D deficiency, unspecified: Secondary | ICD-10-CM | POA: Diagnosis not present

## 2019-12-02 DIAGNOSIS — I2510 Atherosclerotic heart disease of native coronary artery without angina pectoris: Secondary | ICD-10-CM | POA: Diagnosis not present

## 2019-12-02 DIAGNOSIS — S52502D Unspecified fracture of the lower end of left radius, subsequent encounter for closed fracture with routine healing: Secondary | ICD-10-CM | POA: Diagnosis not present

## 2019-12-02 DIAGNOSIS — S92351D Displaced fracture of fifth metatarsal bone, right foot, subsequent encounter for fracture with routine healing: Secondary | ICD-10-CM | POA: Diagnosis not present

## 2019-12-02 DIAGNOSIS — Z13820 Encounter for screening for osteoporosis: Secondary | ICD-10-CM | POA: Diagnosis not present

## 2019-12-02 DIAGNOSIS — I700 Atherosclerosis of aorta: Secondary | ICD-10-CM | POA: Diagnosis not present

## 2019-12-02 DIAGNOSIS — E890 Postprocedural hypothyroidism: Secondary | ICD-10-CM | POA: Diagnosis not present

## 2019-12-02 DIAGNOSIS — Z794 Long term (current) use of insulin: Secondary | ICD-10-CM | POA: Diagnosis not present

## 2019-12-02 DIAGNOSIS — I509 Heart failure, unspecified: Secondary | ICD-10-CM | POA: Diagnosis not present

## 2019-12-02 DIAGNOSIS — N1831 Chronic kidney disease, stage 3a: Secondary | ICD-10-CM | POA: Diagnosis not present

## 2019-12-03 DIAGNOSIS — I255 Ischemic cardiomyopathy: Secondary | ICD-10-CM | POA: Diagnosis not present

## 2019-12-03 DIAGNOSIS — S92351D Displaced fracture of fifth metatarsal bone, right foot, subsequent encounter for fracture with routine healing: Secondary | ICD-10-CM | POA: Diagnosis not present

## 2019-12-03 DIAGNOSIS — E1122 Type 2 diabetes mellitus with diabetic chronic kidney disease: Secondary | ICD-10-CM | POA: Diagnosis not present

## 2019-12-03 DIAGNOSIS — I2510 Atherosclerotic heart disease of native coronary artery without angina pectoris: Secondary | ICD-10-CM | POA: Diagnosis not present

## 2019-12-03 DIAGNOSIS — I509 Heart failure, unspecified: Secondary | ICD-10-CM | POA: Diagnosis not present

## 2019-12-03 DIAGNOSIS — I130 Hypertensive heart and chronic kidney disease with heart failure and stage 1 through stage 4 chronic kidney disease, or unspecified chronic kidney disease: Secondary | ICD-10-CM | POA: Diagnosis not present

## 2019-12-03 DIAGNOSIS — S52502D Unspecified fracture of the lower end of left radius, subsequent encounter for closed fracture with routine healing: Secondary | ICD-10-CM | POA: Diagnosis not present

## 2019-12-03 DIAGNOSIS — S76111D Strain of right quadriceps muscle, fascia and tendon, subsequent encounter: Secondary | ICD-10-CM | POA: Diagnosis not present

## 2019-12-03 DIAGNOSIS — I700 Atherosclerosis of aorta: Secondary | ICD-10-CM | POA: Diagnosis not present

## 2019-12-07 ENCOUNTER — Other Ambulatory Visit: Payer: Self-pay | Admitting: Internal Medicine

## 2019-12-07 DIAGNOSIS — Z86010 Personal history of colonic polyps: Secondary | ICD-10-CM | POA: Diagnosis not present

## 2019-12-07 DIAGNOSIS — I130 Hypertensive heart and chronic kidney disease with heart failure and stage 1 through stage 4 chronic kidney disease, or unspecified chronic kidney disease: Secondary | ICD-10-CM | POA: Diagnosis not present

## 2019-12-07 DIAGNOSIS — S76111D Strain of right quadriceps muscle, fascia and tendon, subsequent encounter: Secondary | ICD-10-CM | POA: Diagnosis not present

## 2019-12-07 DIAGNOSIS — K200 Eosinophilic esophagitis: Secondary | ICD-10-CM | POA: Diagnosis not present

## 2019-12-07 DIAGNOSIS — S52502D Unspecified fracture of the lower end of left radius, subsequent encounter for closed fracture with routine healing: Secondary | ICD-10-CM | POA: Diagnosis not present

## 2019-12-07 DIAGNOSIS — I700 Atherosclerosis of aorta: Secondary | ICD-10-CM | POA: Diagnosis not present

## 2019-12-07 DIAGNOSIS — I255 Ischemic cardiomyopathy: Secondary | ICD-10-CM | POA: Diagnosis not present

## 2019-12-07 DIAGNOSIS — E1122 Type 2 diabetes mellitus with diabetic chronic kidney disease: Secondary | ICD-10-CM | POA: Diagnosis not present

## 2019-12-07 DIAGNOSIS — I509 Heart failure, unspecified: Secondary | ICD-10-CM | POA: Diagnosis not present

## 2019-12-07 DIAGNOSIS — Z8781 Personal history of (healed) traumatic fracture: Secondary | ICD-10-CM

## 2019-12-07 DIAGNOSIS — Z9049 Acquired absence of other specified parts of digestive tract: Secondary | ICD-10-CM | POA: Diagnosis not present

## 2019-12-07 DIAGNOSIS — K219 Gastro-esophageal reflux disease without esophagitis: Secondary | ICD-10-CM | POA: Diagnosis not present

## 2019-12-07 DIAGNOSIS — Z8679 Personal history of other diseases of the circulatory system: Secondary | ICD-10-CM | POA: Diagnosis not present

## 2019-12-07 DIAGNOSIS — S92351D Displaced fracture of fifth metatarsal bone, right foot, subsequent encounter for fracture with routine healing: Secondary | ICD-10-CM | POA: Diagnosis not present

## 2019-12-07 DIAGNOSIS — I2510 Atherosclerotic heart disease of native coronary artery without angina pectoris: Secondary | ICD-10-CM | POA: Diagnosis not present

## 2019-12-07 DIAGNOSIS — K862 Cyst of pancreas: Secondary | ICD-10-CM | POA: Diagnosis not present

## 2019-12-09 DIAGNOSIS — S92351D Displaced fracture of fifth metatarsal bone, right foot, subsequent encounter for fracture with routine healing: Secondary | ICD-10-CM | POA: Diagnosis not present

## 2019-12-09 DIAGNOSIS — S52502D Unspecified fracture of the lower end of left radius, subsequent encounter for closed fracture with routine healing: Secondary | ICD-10-CM | POA: Diagnosis not present

## 2019-12-09 DIAGNOSIS — I255 Ischemic cardiomyopathy: Secondary | ICD-10-CM | POA: Diagnosis not present

## 2019-12-09 DIAGNOSIS — I509 Heart failure, unspecified: Secondary | ICD-10-CM | POA: Diagnosis not present

## 2019-12-09 DIAGNOSIS — I2510 Atherosclerotic heart disease of native coronary artery without angina pectoris: Secondary | ICD-10-CM | POA: Diagnosis not present

## 2019-12-09 DIAGNOSIS — I700 Atherosclerosis of aorta: Secondary | ICD-10-CM | POA: Diagnosis not present

## 2019-12-09 DIAGNOSIS — S76111D Strain of right quadriceps muscle, fascia and tendon, subsequent encounter: Secondary | ICD-10-CM | POA: Diagnosis not present

## 2019-12-09 DIAGNOSIS — E1122 Type 2 diabetes mellitus with diabetic chronic kidney disease: Secondary | ICD-10-CM | POA: Diagnosis not present

## 2019-12-09 DIAGNOSIS — I130 Hypertensive heart and chronic kidney disease with heart failure and stage 1 through stage 4 chronic kidney disease, or unspecified chronic kidney disease: Secondary | ICD-10-CM | POA: Diagnosis not present

## 2019-12-10 ENCOUNTER — Other Ambulatory Visit: Payer: Medicare PPO

## 2019-12-11 DIAGNOSIS — I429 Cardiomyopathy, unspecified: Secondary | ICD-10-CM | POA: Diagnosis not present

## 2019-12-11 DIAGNOSIS — I2510 Atherosclerotic heart disease of native coronary artery without angina pectoris: Secondary | ICD-10-CM | POA: Diagnosis not present

## 2019-12-11 DIAGNOSIS — S52502D Unspecified fracture of the lower end of left radius, subsequent encounter for closed fracture with routine healing: Secondary | ICD-10-CM | POA: Diagnosis not present

## 2019-12-11 DIAGNOSIS — N183 Chronic kidney disease, stage 3 unspecified: Secondary | ICD-10-CM | POA: Diagnosis not present

## 2019-12-11 DIAGNOSIS — I10 Essential (primary) hypertension: Secondary | ICD-10-CM | POA: Diagnosis not present

## 2019-12-11 DIAGNOSIS — I255 Ischemic cardiomyopathy: Secondary | ICD-10-CM | POA: Diagnosis not present

## 2019-12-11 DIAGNOSIS — R911 Solitary pulmonary nodule: Secondary | ICD-10-CM | POA: Diagnosis not present

## 2019-12-11 DIAGNOSIS — N2581 Secondary hyperparathyroidism of renal origin: Secondary | ICD-10-CM | POA: Diagnosis not present

## 2019-12-11 DIAGNOSIS — I130 Hypertensive heart and chronic kidney disease with heart failure and stage 1 through stage 4 chronic kidney disease, or unspecified chronic kidney disease: Secondary | ICD-10-CM | POA: Diagnosis not present

## 2019-12-11 DIAGNOSIS — S76111D Strain of right quadriceps muscle, fascia and tendon, subsequent encounter: Secondary | ICD-10-CM | POA: Diagnosis not present

## 2019-12-11 DIAGNOSIS — I700 Atherosclerosis of aorta: Secondary | ICD-10-CM | POA: Diagnosis not present

## 2019-12-11 DIAGNOSIS — E781 Pure hyperglyceridemia: Secondary | ICD-10-CM | POA: Diagnosis not present

## 2019-12-11 DIAGNOSIS — E1122 Type 2 diabetes mellitus with diabetic chronic kidney disease: Secondary | ICD-10-CM | POA: Diagnosis not present

## 2019-12-11 DIAGNOSIS — Z0000 Encounter for general adult medical examination without abnormal findings: Secondary | ICD-10-CM | POA: Diagnosis not present

## 2019-12-11 DIAGNOSIS — S92351D Displaced fracture of fifth metatarsal bone, right foot, subsequent encounter for fracture with routine healing: Secondary | ICD-10-CM | POA: Diagnosis not present

## 2019-12-11 DIAGNOSIS — I509 Heart failure, unspecified: Secondary | ICD-10-CM | POA: Diagnosis not present

## 2019-12-11 DIAGNOSIS — K118 Other diseases of salivary glands: Secondary | ICD-10-CM | POA: Diagnosis not present

## 2019-12-15 DIAGNOSIS — S76111D Strain of right quadriceps muscle, fascia and tendon, subsequent encounter: Secondary | ICD-10-CM | POA: Diagnosis not present

## 2019-12-15 DIAGNOSIS — E1122 Type 2 diabetes mellitus with diabetic chronic kidney disease: Secondary | ICD-10-CM | POA: Diagnosis not present

## 2019-12-15 DIAGNOSIS — S52502D Unspecified fracture of the lower end of left radius, subsequent encounter for closed fracture with routine healing: Secondary | ICD-10-CM | POA: Diagnosis not present

## 2019-12-15 DIAGNOSIS — I2510 Atherosclerotic heart disease of native coronary artery without angina pectoris: Secondary | ICD-10-CM | POA: Diagnosis not present

## 2019-12-15 DIAGNOSIS — I130 Hypertensive heart and chronic kidney disease with heart failure and stage 1 through stage 4 chronic kidney disease, or unspecified chronic kidney disease: Secondary | ICD-10-CM | POA: Diagnosis not present

## 2019-12-15 DIAGNOSIS — I255 Ischemic cardiomyopathy: Secondary | ICD-10-CM | POA: Diagnosis not present

## 2019-12-15 DIAGNOSIS — I509 Heart failure, unspecified: Secondary | ICD-10-CM | POA: Diagnosis not present

## 2019-12-15 DIAGNOSIS — S92351D Displaced fracture of fifth metatarsal bone, right foot, subsequent encounter for fracture with routine healing: Secondary | ICD-10-CM | POA: Diagnosis not present

## 2019-12-15 DIAGNOSIS — I700 Atherosclerosis of aorta: Secondary | ICD-10-CM | POA: Diagnosis not present

## 2019-12-16 DIAGNOSIS — I700 Atherosclerosis of aorta: Secondary | ICD-10-CM | POA: Diagnosis not present

## 2019-12-16 DIAGNOSIS — E1122 Type 2 diabetes mellitus with diabetic chronic kidney disease: Secondary | ICD-10-CM | POA: Diagnosis not present

## 2019-12-16 DIAGNOSIS — S92351D Displaced fracture of fifth metatarsal bone, right foot, subsequent encounter for fracture with routine healing: Secondary | ICD-10-CM | POA: Diagnosis not present

## 2019-12-16 DIAGNOSIS — S52502D Unspecified fracture of the lower end of left radius, subsequent encounter for closed fracture with routine healing: Secondary | ICD-10-CM | POA: Diagnosis not present

## 2019-12-16 DIAGNOSIS — S76111D Strain of right quadriceps muscle, fascia and tendon, subsequent encounter: Secondary | ICD-10-CM | POA: Diagnosis not present

## 2019-12-16 DIAGNOSIS — I509 Heart failure, unspecified: Secondary | ICD-10-CM | POA: Diagnosis not present

## 2019-12-16 DIAGNOSIS — I130 Hypertensive heart and chronic kidney disease with heart failure and stage 1 through stage 4 chronic kidney disease, or unspecified chronic kidney disease: Secondary | ICD-10-CM | POA: Diagnosis not present

## 2019-12-16 DIAGNOSIS — I2510 Atherosclerotic heart disease of native coronary artery without angina pectoris: Secondary | ICD-10-CM | POA: Diagnosis not present

## 2019-12-16 DIAGNOSIS — I255 Ischemic cardiomyopathy: Secondary | ICD-10-CM | POA: Diagnosis not present

## 2019-12-18 DIAGNOSIS — I2510 Atherosclerotic heart disease of native coronary artery without angina pectoris: Secondary | ICD-10-CM | POA: Diagnosis not present

## 2019-12-18 DIAGNOSIS — I130 Hypertensive heart and chronic kidney disease with heart failure and stage 1 through stage 4 chronic kidney disease, or unspecified chronic kidney disease: Secondary | ICD-10-CM | POA: Diagnosis not present

## 2019-12-18 DIAGNOSIS — S52502D Unspecified fracture of the lower end of left radius, subsequent encounter for closed fracture with routine healing: Secondary | ICD-10-CM | POA: Diagnosis not present

## 2019-12-18 DIAGNOSIS — S92351D Displaced fracture of fifth metatarsal bone, right foot, subsequent encounter for fracture with routine healing: Secondary | ICD-10-CM | POA: Diagnosis not present

## 2019-12-18 DIAGNOSIS — I509 Heart failure, unspecified: Secondary | ICD-10-CM | POA: Diagnosis not present

## 2019-12-18 DIAGNOSIS — S76111D Strain of right quadriceps muscle, fascia and tendon, subsequent encounter: Secondary | ICD-10-CM | POA: Diagnosis not present

## 2019-12-18 DIAGNOSIS — E1122 Type 2 diabetes mellitus with diabetic chronic kidney disease: Secondary | ICD-10-CM | POA: Diagnosis not present

## 2019-12-18 DIAGNOSIS — I255 Ischemic cardiomyopathy: Secondary | ICD-10-CM | POA: Diagnosis not present

## 2019-12-18 DIAGNOSIS — I700 Atherosclerosis of aorta: Secondary | ICD-10-CM | POA: Diagnosis not present

## 2019-12-21 DIAGNOSIS — S92351D Displaced fracture of fifth metatarsal bone, right foot, subsequent encounter for fracture with routine healing: Secondary | ICD-10-CM | POA: Diagnosis not present

## 2019-12-21 DIAGNOSIS — I130 Hypertensive heart and chronic kidney disease with heart failure and stage 1 through stage 4 chronic kidney disease, or unspecified chronic kidney disease: Secondary | ICD-10-CM | POA: Diagnosis not present

## 2019-12-21 DIAGNOSIS — I509 Heart failure, unspecified: Secondary | ICD-10-CM | POA: Diagnosis not present

## 2019-12-21 DIAGNOSIS — S76111D Strain of right quadriceps muscle, fascia and tendon, subsequent encounter: Secondary | ICD-10-CM | POA: Diagnosis not present

## 2019-12-21 DIAGNOSIS — I2510 Atherosclerotic heart disease of native coronary artery without angina pectoris: Secondary | ICD-10-CM | POA: Diagnosis not present

## 2019-12-21 DIAGNOSIS — E1122 Type 2 diabetes mellitus with diabetic chronic kidney disease: Secondary | ICD-10-CM | POA: Diagnosis not present

## 2019-12-21 DIAGNOSIS — S52502D Unspecified fracture of the lower end of left radius, subsequent encounter for closed fracture with routine healing: Secondary | ICD-10-CM | POA: Diagnosis not present

## 2019-12-21 DIAGNOSIS — I255 Ischemic cardiomyopathy: Secondary | ICD-10-CM | POA: Diagnosis not present

## 2019-12-21 DIAGNOSIS — I700 Atherosclerosis of aorta: Secondary | ICD-10-CM | POA: Diagnosis not present

## 2019-12-22 DIAGNOSIS — K118 Other diseases of salivary glands: Secondary | ICD-10-CM | POA: Diagnosis not present

## 2019-12-28 DIAGNOSIS — S92351D Displaced fracture of fifth metatarsal bone, right foot, subsequent encounter for fracture with routine healing: Secondary | ICD-10-CM | POA: Diagnosis not present

## 2019-12-28 DIAGNOSIS — S52502D Unspecified fracture of the lower end of left radius, subsequent encounter for closed fracture with routine healing: Secondary | ICD-10-CM | POA: Diagnosis not present

## 2019-12-28 DIAGNOSIS — Z4789 Encounter for other orthopedic aftercare: Secondary | ICD-10-CM | POA: Diagnosis not present

## 2019-12-29 DIAGNOSIS — I130 Hypertensive heart and chronic kidney disease with heart failure and stage 1 through stage 4 chronic kidney disease, or unspecified chronic kidney disease: Secondary | ICD-10-CM | POA: Diagnosis not present

## 2019-12-29 DIAGNOSIS — S92351D Displaced fracture of fifth metatarsal bone, right foot, subsequent encounter for fracture with routine healing: Secondary | ICD-10-CM | POA: Diagnosis not present

## 2019-12-29 DIAGNOSIS — I255 Ischemic cardiomyopathy: Secondary | ICD-10-CM | POA: Diagnosis not present

## 2019-12-29 DIAGNOSIS — S76111D Strain of right quadriceps muscle, fascia and tendon, subsequent encounter: Secondary | ICD-10-CM | POA: Diagnosis not present

## 2019-12-29 DIAGNOSIS — I700 Atherosclerosis of aorta: Secondary | ICD-10-CM | POA: Diagnosis not present

## 2019-12-29 DIAGNOSIS — I2510 Atherosclerotic heart disease of native coronary artery without angina pectoris: Secondary | ICD-10-CM | POA: Diagnosis not present

## 2019-12-29 DIAGNOSIS — S52502D Unspecified fracture of the lower end of left radius, subsequent encounter for closed fracture with routine healing: Secondary | ICD-10-CM | POA: Diagnosis not present

## 2019-12-29 DIAGNOSIS — E1122 Type 2 diabetes mellitus with diabetic chronic kidney disease: Secondary | ICD-10-CM | POA: Diagnosis not present

## 2019-12-29 DIAGNOSIS — I509 Heart failure, unspecified: Secondary | ICD-10-CM | POA: Diagnosis not present

## 2019-12-30 DIAGNOSIS — S76111D Strain of right quadriceps muscle, fascia and tendon, subsequent encounter: Secondary | ICD-10-CM | POA: Diagnosis not present

## 2019-12-30 DIAGNOSIS — S92351D Displaced fracture of fifth metatarsal bone, right foot, subsequent encounter for fracture with routine healing: Secondary | ICD-10-CM | POA: Diagnosis not present

## 2019-12-30 DIAGNOSIS — E1122 Type 2 diabetes mellitus with diabetic chronic kidney disease: Secondary | ICD-10-CM | POA: Diagnosis not present

## 2019-12-30 DIAGNOSIS — S52502D Unspecified fracture of the lower end of left radius, subsequent encounter for closed fracture with routine healing: Secondary | ICD-10-CM | POA: Diagnosis not present

## 2019-12-30 DIAGNOSIS — I700 Atherosclerosis of aorta: Secondary | ICD-10-CM | POA: Diagnosis not present

## 2019-12-30 DIAGNOSIS — I130 Hypertensive heart and chronic kidney disease with heart failure and stage 1 through stage 4 chronic kidney disease, or unspecified chronic kidney disease: Secondary | ICD-10-CM | POA: Diagnosis not present

## 2019-12-30 DIAGNOSIS — I255 Ischemic cardiomyopathy: Secondary | ICD-10-CM | POA: Diagnosis not present

## 2019-12-30 DIAGNOSIS — I2510 Atherosclerotic heart disease of native coronary artery without angina pectoris: Secondary | ICD-10-CM | POA: Diagnosis not present

## 2019-12-30 DIAGNOSIS — I509 Heart failure, unspecified: Secondary | ICD-10-CM | POA: Diagnosis not present

## 2019-12-31 DIAGNOSIS — S92351D Displaced fracture of fifth metatarsal bone, right foot, subsequent encounter for fracture with routine healing: Secondary | ICD-10-CM | POA: Diagnosis not present

## 2019-12-31 DIAGNOSIS — S52502D Unspecified fracture of the lower end of left radius, subsequent encounter for closed fracture with routine healing: Secondary | ICD-10-CM | POA: Diagnosis not present

## 2019-12-31 DIAGNOSIS — I700 Atherosclerosis of aorta: Secondary | ICD-10-CM | POA: Diagnosis not present

## 2019-12-31 DIAGNOSIS — I130 Hypertensive heart and chronic kidney disease with heart failure and stage 1 through stage 4 chronic kidney disease, or unspecified chronic kidney disease: Secondary | ICD-10-CM | POA: Diagnosis not present

## 2019-12-31 DIAGNOSIS — I2510 Atherosclerotic heart disease of native coronary artery without angina pectoris: Secondary | ICD-10-CM | POA: Diagnosis not present

## 2019-12-31 DIAGNOSIS — S76111D Strain of right quadriceps muscle, fascia and tendon, subsequent encounter: Secondary | ICD-10-CM | POA: Diagnosis not present

## 2019-12-31 DIAGNOSIS — I509 Heart failure, unspecified: Secondary | ICD-10-CM | POA: Diagnosis not present

## 2019-12-31 DIAGNOSIS — I255 Ischemic cardiomyopathy: Secondary | ICD-10-CM | POA: Diagnosis not present

## 2019-12-31 DIAGNOSIS — E1122 Type 2 diabetes mellitus with diabetic chronic kidney disease: Secondary | ICD-10-CM | POA: Diagnosis not present

## 2020-01-04 DIAGNOSIS — S52502D Unspecified fracture of the lower end of left radius, subsequent encounter for closed fracture with routine healing: Secondary | ICD-10-CM | POA: Diagnosis not present

## 2020-01-04 DIAGNOSIS — S92351D Displaced fracture of fifth metatarsal bone, right foot, subsequent encounter for fracture with routine healing: Secondary | ICD-10-CM | POA: Diagnosis not present

## 2020-01-04 DIAGNOSIS — I700 Atherosclerosis of aorta: Secondary | ICD-10-CM | POA: Diagnosis not present

## 2020-01-04 DIAGNOSIS — I509 Heart failure, unspecified: Secondary | ICD-10-CM | POA: Diagnosis not present

## 2020-01-04 DIAGNOSIS — I255 Ischemic cardiomyopathy: Secondary | ICD-10-CM | POA: Diagnosis not present

## 2020-01-04 DIAGNOSIS — E1122 Type 2 diabetes mellitus with diabetic chronic kidney disease: Secondary | ICD-10-CM | POA: Diagnosis not present

## 2020-01-04 DIAGNOSIS — I130 Hypertensive heart and chronic kidney disease with heart failure and stage 1 through stage 4 chronic kidney disease, or unspecified chronic kidney disease: Secondary | ICD-10-CM | POA: Diagnosis not present

## 2020-01-04 DIAGNOSIS — S76111D Strain of right quadriceps muscle, fascia and tendon, subsequent encounter: Secondary | ICD-10-CM | POA: Diagnosis not present

## 2020-01-04 DIAGNOSIS — I2510 Atherosclerotic heart disease of native coronary artery without angina pectoris: Secondary | ICD-10-CM | POA: Diagnosis not present

## 2020-01-07 DIAGNOSIS — I509 Heart failure, unspecified: Secondary | ICD-10-CM | POA: Diagnosis not present

## 2020-01-07 DIAGNOSIS — I700 Atherosclerosis of aorta: Secondary | ICD-10-CM | POA: Diagnosis not present

## 2020-01-07 DIAGNOSIS — S52502D Unspecified fracture of the lower end of left radius, subsequent encounter for closed fracture with routine healing: Secondary | ICD-10-CM | POA: Diagnosis not present

## 2020-01-07 DIAGNOSIS — E1122 Type 2 diabetes mellitus with diabetic chronic kidney disease: Secondary | ICD-10-CM | POA: Diagnosis not present

## 2020-01-07 DIAGNOSIS — I255 Ischemic cardiomyopathy: Secondary | ICD-10-CM | POA: Diagnosis not present

## 2020-01-07 DIAGNOSIS — S92351D Displaced fracture of fifth metatarsal bone, right foot, subsequent encounter for fracture with routine healing: Secondary | ICD-10-CM | POA: Diagnosis not present

## 2020-01-07 DIAGNOSIS — I2510 Atherosclerotic heart disease of native coronary artery without angina pectoris: Secondary | ICD-10-CM | POA: Diagnosis not present

## 2020-01-07 DIAGNOSIS — S76111D Strain of right quadriceps muscle, fascia and tendon, subsequent encounter: Secondary | ICD-10-CM | POA: Diagnosis not present

## 2020-01-07 DIAGNOSIS — I130 Hypertensive heart and chronic kidney disease with heart failure and stage 1 through stage 4 chronic kidney disease, or unspecified chronic kidney disease: Secondary | ICD-10-CM | POA: Diagnosis not present

## 2020-01-11 DIAGNOSIS — I509 Heart failure, unspecified: Secondary | ICD-10-CM | POA: Diagnosis not present

## 2020-01-11 DIAGNOSIS — S92351D Displaced fracture of fifth metatarsal bone, right foot, subsequent encounter for fracture with routine healing: Secondary | ICD-10-CM | POA: Diagnosis not present

## 2020-01-11 DIAGNOSIS — I2510 Atherosclerotic heart disease of native coronary artery without angina pectoris: Secondary | ICD-10-CM | POA: Diagnosis not present

## 2020-01-11 DIAGNOSIS — I130 Hypertensive heart and chronic kidney disease with heart failure and stage 1 through stage 4 chronic kidney disease, or unspecified chronic kidney disease: Secondary | ICD-10-CM | POA: Diagnosis not present

## 2020-01-11 DIAGNOSIS — S76111D Strain of right quadriceps muscle, fascia and tendon, subsequent encounter: Secondary | ICD-10-CM | POA: Diagnosis not present

## 2020-01-11 DIAGNOSIS — I255 Ischemic cardiomyopathy: Secondary | ICD-10-CM | POA: Diagnosis not present

## 2020-01-11 DIAGNOSIS — S52502D Unspecified fracture of the lower end of left radius, subsequent encounter for closed fracture with routine healing: Secondary | ICD-10-CM | POA: Diagnosis not present

## 2020-01-11 DIAGNOSIS — E1122 Type 2 diabetes mellitus with diabetic chronic kidney disease: Secondary | ICD-10-CM | POA: Diagnosis not present

## 2020-01-11 DIAGNOSIS — I700 Atherosclerosis of aorta: Secondary | ICD-10-CM | POA: Diagnosis not present

## 2020-01-12 ENCOUNTER — Telehealth: Payer: Self-pay

## 2020-01-12 NOTE — Telephone Encounter (Signed)
Per last ov note, pt was to have repeat BMP. He just rescheduled echo and f/u appt but wants to know if you can use the BMP from 10/2019 or if he still needs to have it done again. Please respond to clinical so they can call pt to advise.//ah

## 2020-01-12 NOTE — Telephone Encounter (Signed)
No need to repeat labs

## 2020-01-13 DIAGNOSIS — I700 Atherosclerosis of aorta: Secondary | ICD-10-CM | POA: Diagnosis not present

## 2020-01-13 DIAGNOSIS — I255 Ischemic cardiomyopathy: Secondary | ICD-10-CM | POA: Diagnosis not present

## 2020-01-13 DIAGNOSIS — S92351D Displaced fracture of fifth metatarsal bone, right foot, subsequent encounter for fracture with routine healing: Secondary | ICD-10-CM | POA: Diagnosis not present

## 2020-01-13 DIAGNOSIS — I130 Hypertensive heart and chronic kidney disease with heart failure and stage 1 through stage 4 chronic kidney disease, or unspecified chronic kidney disease: Secondary | ICD-10-CM | POA: Diagnosis not present

## 2020-01-13 DIAGNOSIS — E042 Nontoxic multinodular goiter: Secondary | ICD-10-CM | POA: Diagnosis not present

## 2020-01-13 DIAGNOSIS — E1122 Type 2 diabetes mellitus with diabetic chronic kidney disease: Secondary | ICD-10-CM | POA: Diagnosis not present

## 2020-01-13 DIAGNOSIS — S76111D Strain of right quadriceps muscle, fascia and tendon, subsequent encounter: Secondary | ICD-10-CM | POA: Diagnosis not present

## 2020-01-13 DIAGNOSIS — I509 Heart failure, unspecified: Secondary | ICD-10-CM | POA: Diagnosis not present

## 2020-01-13 DIAGNOSIS — I2510 Atherosclerotic heart disease of native coronary artery without angina pectoris: Secondary | ICD-10-CM | POA: Diagnosis not present

## 2020-01-13 DIAGNOSIS — S52502D Unspecified fracture of the lower end of left radius, subsequent encounter for closed fracture with routine healing: Secondary | ICD-10-CM | POA: Diagnosis not present

## 2020-01-15 DIAGNOSIS — E1122 Type 2 diabetes mellitus with diabetic chronic kidney disease: Secondary | ICD-10-CM | POA: Diagnosis not present

## 2020-01-15 DIAGNOSIS — D631 Anemia in chronic kidney disease: Secondary | ICD-10-CM | POA: Diagnosis not present

## 2020-01-15 DIAGNOSIS — N183 Chronic kidney disease, stage 3 unspecified: Secondary | ICD-10-CM | POA: Diagnosis not present

## 2020-01-15 DIAGNOSIS — N2889 Other specified disorders of kidney and ureter: Secondary | ICD-10-CM | POA: Diagnosis not present

## 2020-01-15 DIAGNOSIS — I2510 Atherosclerotic heart disease of native coronary artery without angina pectoris: Secondary | ICD-10-CM | POA: Diagnosis not present

## 2020-01-15 DIAGNOSIS — N2581 Secondary hyperparathyroidism of renal origin: Secondary | ICD-10-CM | POA: Diagnosis not present

## 2020-01-15 DIAGNOSIS — R809 Proteinuria, unspecified: Secondary | ICD-10-CM | POA: Diagnosis not present

## 2020-01-15 DIAGNOSIS — I129 Hypertensive chronic kidney disease with stage 1 through stage 4 chronic kidney disease, or unspecified chronic kidney disease: Secondary | ICD-10-CM | POA: Diagnosis not present

## 2020-01-15 DIAGNOSIS — M109 Gout, unspecified: Secondary | ICD-10-CM | POA: Diagnosis not present

## 2020-01-18 ENCOUNTER — Other Ambulatory Visit: Payer: Self-pay

## 2020-01-18 ENCOUNTER — Ambulatory Visit: Payer: Medicare PPO

## 2020-01-18 DIAGNOSIS — I25810 Atherosclerosis of coronary artery bypass graft(s) without angina pectoris: Secondary | ICD-10-CM | POA: Diagnosis not present

## 2020-01-18 DIAGNOSIS — I255 Ischemic cardiomyopathy: Secondary | ICD-10-CM | POA: Diagnosis not present

## 2020-01-18 DIAGNOSIS — I77810 Thoracic aortic ectasia: Secondary | ICD-10-CM

## 2020-01-19 NOTE — Telephone Encounter (Signed)
LMOM advising.//ah

## 2020-01-22 DIAGNOSIS — H40023 Open angle with borderline findings, high risk, bilateral: Secondary | ICD-10-CM | POA: Diagnosis not present

## 2020-01-26 DIAGNOSIS — I5022 Chronic systolic (congestive) heart failure: Secondary | ICD-10-CM | POA: Insufficient documentation

## 2020-01-26 NOTE — Progress Notes (Signed)
Rescheduled

## 2020-01-27 DIAGNOSIS — M25661 Stiffness of right knee, not elsewhere classified: Secondary | ICD-10-CM | POA: Diagnosis not present

## 2020-01-27 DIAGNOSIS — M25561 Pain in right knee: Secondary | ICD-10-CM | POA: Diagnosis not present

## 2020-01-28 ENCOUNTER — Ambulatory Visit: Payer: Medicare PPO | Admitting: Cardiology

## 2020-01-28 ENCOUNTER — Encounter: Payer: Self-pay | Admitting: Cardiology

## 2020-01-28 ENCOUNTER — Other Ambulatory Visit: Payer: Self-pay

## 2020-01-28 VITALS — BP 131/87 | HR 55 | Resp 17 | Ht 67.0 in | Wt 209.0 lb

## 2020-01-28 DIAGNOSIS — I10 Essential (primary) hypertension: Secondary | ICD-10-CM | POA: Diagnosis not present

## 2020-01-28 DIAGNOSIS — I77810 Thoracic aortic ectasia: Secondary | ICD-10-CM | POA: Diagnosis not present

## 2020-01-28 DIAGNOSIS — I25810 Atherosclerosis of coronary artery bypass graft(s) without angina pectoris: Secondary | ICD-10-CM | POA: Diagnosis not present

## 2020-01-28 DIAGNOSIS — I255 Ischemic cardiomyopathy: Secondary | ICD-10-CM | POA: Diagnosis not present

## 2020-01-28 NOTE — Progress Notes (Signed)
Patient is here for follow up visit.  Subjective:   @Patient  ID: Nathan Medina, male    DOB: January 15, 1951, 69 y.o.   MRN: [id number]  Chief Complaint  Patient presents with  . Coronary Artery Disease  . Congestive Heart Failure  . Follow-up    6 month  . Results    echo    69 year old Caucasian male with hypertension, CKD stage III, and 2 diabetes mellitus, ischemic cardiomyopathy with recovered EF, coronary artery disease s/p CABGX3 (LIMA-LAD, SVG-dRCA, SVG-ramus) by Dr. Servando Snare on 04/01/2018, stable renal and lung nodules followed by specialists, s/p thyroidectomy.  Patient has been doing fairly well from cardiac standpoint.  He denies any dyspnea on exertion. Leg edema is stable. He recently underwent left knee replacement. He is recovering from the same.    Current Outpatient Medications on File Prior to Visit  Medication Sig Dispense Refill  . allopurinol (ZYLOPRIM) 300 MG tablet TAKE 1 TABLET BY MOUTH EVERY DAY (Patient taking differently: Take 300 mg by mouth daily. ) 90 tablet 0  . ALPRAZolam (XANAX) 0.5 MG tablet Take 1 tablet (0.5 mg total) by mouth daily as needed for sleep. 1/2 - 1 by mouth once daily as needed (Patient taking differently: Take 0.25-0.5 mg by mouth at bedtime as needed for sleep. ) 90 tablet 2  . aspirin EC 81 MG tablet Take 81 mg by mouth every other day.     [name] atorvastatin (LIPITOR) 10 MG tablet Take 10 mg by mouth at bedtime.    . calcium carbonate (TUMS) 500 MG chewable tablet Chew 2 tablets (400 mg of elemental calcium total) by mouth 2 (two) times daily. (Patient taking differently: Chew 2 tablets by mouth 2 (two) times daily as needed for indigestion. ) 90 tablet 1  . carvedilol (COREG) 6.25 MG tablet Take 1 tablet (6.25 mg total) by mouth 2 (two) times daily. 180 tablet 3  . Cholecalciferol (VITAMIN D-3) 5000 units TABS Take 5,000 Units by mouth at bedtime.    . diphenhydrAMINE (BENADRYL) 25 MG tablet Take 25 mg by mouth at bedtime as needed  for sleep.    . furosemide (LASIX) 40 MG tablet Take 40 mg by mouth every other day.     [name] guaiFENesin (MUCINEX) 600 MG 12 hr tablet Take 1,200 mg by mouth 2 (two) times daily as needed for cough or to loosen phlegm.    [name] HYDROcodone-acetaminophen (NORCO) 5-325 MG tablet Take 1-2 tablets by mouth every 8 (eight) hours as needed for moderate pain or severe pain. 20 tablet 0  . insulin glargine (LANTUS) 100 unit/mL SOPN Inject 44 Units into the skin at bedtime.    [name] levothyroxine (SYNTHROID) 50 MCG tablet Take 125 mcg by mouth daily. Taking 2.5 tablets daily    . methocarbamol (ROBAXIN) 500 MG tablet Take 1 tablet (500 mg total) by mouth every 6 (six) hours as needed for muscle spasms. 30 tablet 0  . Multiple Vitamins-Minerals (PRESERVISION AREDS 2 PO) Take 1 tablet by mouth 2 (two) times daily.    [name] omega-3 acid ethyl esters (LOVAZA) 1 g capsule Take 2 g by mouth 2 (two) times daily.     . pantoprazole (PROTONIX) 40 MG tablet Take 40 mg by mouth 2 (two) times daily.    . rivaroxaban (XARELTO) 2.5 MG TABS tablet Take 1 tablet (2.5 mg total) by mouth 2 (two) times daily. 180 tablet 03  . sacubitril-valsartan (ENTRESTO) 49-51 MG Take 1 tablet by mouth 2 (two) times daily.  180 tablet 1  . sildenafil (REVATIO) 20 MG tablet Take 100 mg by mouth daily as needed.     . triamcinolone cream (KENALOG) 0.1 % Apply 1 application topically 2 (two) times daily as needed (skin irritation).    . TRULICITY 7.82 NF/6.2ZH SOPN Inject 0.75 mg into the skin once a week. Monday     No current facility-administered medications on file prior to visit.    Cardiovascular studies:  EKG 01/28/2020: Sinus rhythm 62 bpm Old anterolateral infarct Poor R wave progression  Echocardiogram 01/18/2020:  Left ventricle cavity is normal in size. Moderate concentric hypertrophy  of the left ventricle. Abnormal septal wall motion due to post-operative  coronary artery bypass graft. Limited visualization of endocardial  borders.  LVEF probably 40-45%. Doppler evidence of grade I (impaired)  diastolic dysfunction, normal LAP.  Left atrial cavity is moderately dilated at 4.3 cm. The interatrial Septum  is thin and mobile but appears to be intact by 2D and CF Doppler  interrogation.  Trileaflet aortic valve with mild calcification. Trace aortic stenosis and  regurgitation.  Mild (Grade I) mitral regurgitation.  Mild tricuspid regurgitation. Estimated pulmonary artery systolic pressure  24 mmHg.  The aortic root is dilated at 4 cm.  No significant change compared to previous study on 10/27/2018.  CT Chest w/o contrast 09/04/2018: 1. No change in a ground-glass nodule of the posterior right upper lobe measuring approximately 1.5 cm (series 3, image 31). 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 Recommend additional follow-up in 1 year and annually through 5 years of stability. 2. Partially imaged mass of the left kidney (series 2, image 178), previously characterized as suspicious for malignancy by CT. Cystic lesion of the pancreatic tail measuring 2.9 cm (series 2, image 24). 3.  Coronary artery disease.  Post Bypass TEE 04/01/2018: Tricuspid, Pulmonic, Mitral and Aortic valve unchanged. Anterior and anteroseptal wall motion improved, LVEF > 55% (CO > 6) with vasopressor support. No dissection noted after cannula removed.    Cath 01/14/2018: LM: Normal LAD: Ostial 100% occluded. 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 RPDA distally occluded. Good surgical revascularization targets seen. LVEDP: Normal Recommendation: Diabetic patient with reduced LVEF and high Syntax score and moderate LAD territory ischemia superimposed on infarct. Recommend CVTS evaluation for CABG  Arterial  duplex 11/19/2017: Normal examination. No evidence of hemodynamically significant peripheral arterial disease.  CT chest 08/06/2017: - persistent right upper lobe nodule with increase in size compared to 2018 PET scan 08/26/2017: - very low metabolic activity in the right upper lobe nodule. SUV 1.3. No other significant uptake  Labs: 10/22/2019: Glucose 188, BUN/Cr 38/1.35. EGFR 53. Na/K 138/4.0.  H/H 13/40. MCV 96. Platelets 125 HbA1C 7.1%  06/2018: Chol 112, TG 134, HDL 32, LDL 53   Review of Systems  Cardiovascular: Negative for chest pain, dyspnea on exertion, leg swelling, palpitations and syncope.       Objective:    Vitals:   01/28/20 1609  BP: 131/87  Pulse: (!) 55  Resp: 17  SpO2: 98%       Physical Exam Vitals and nursing note reviewed.  Constitutional:      General: He is not in acute distress. Neck:     Vascular: No JVD.  Cardiovascular:  Rate and Rhythm: Normal rate and regular rhythm.     Heart sounds: Normal heart sounds. No murmur heard.   Pulmonary:     Effort: Pulmonary effort is normal.     Breath sounds: Normal breath sounds. No wheezing or rales.         Assessment & Recommendations:   69 year old Caucasian male with hypertension, CKD stage III, and 2 diabetes mellitus, ischemic cardiomyopathy with recovered EF, coronary artery disease s/p CABGX3 (LIMA-LAD, SVG-dRCA, SVG-ramus) by Dr. Servando Snare on 04/01/2018, stable renal and lung nodules followed by specialists, s/p thyroidectomy, stable thrombocytopenia  Ischemic cardiomyopathy: Recovered EF 40-45% (12/2019) Continue Entresto to 49-51 mg bid, coreg 6.25 mg bid.  Continue lasix 40 mg.  CAD: S/p CABG. No angina symptoms. Continue aspirin to 81 mg daily, Xarelto 2.5 mg bid.  Dilated aortic root: Mild dilatation, stable at 4.0 c, (echocardiogram 12/2019).  Postop Afib: No recurrence. Will monitor.  Hyperlipidemia: Well controlled. TG have also improved. Continue lipitor 10 mg daily.    Thrombocytopenia: Mild, stable. No bleeding. Continue f/u w/PCP.   Management of DM, CKD III, renal and lung nodules as per PCP and other specialists.  F/u in 1 year  [name], MD [facility] [place] Pager: [phone] Office: [phone] If no answer Cell [phone]

## 2020-02-02 DIAGNOSIS — M25561 Pain in right knee: Secondary | ICD-10-CM | POA: Diagnosis not present

## 2020-02-02 DIAGNOSIS — M25661 Stiffness of right knee, not elsewhere classified: Secondary | ICD-10-CM | POA: Diagnosis not present

## 2020-02-04 DIAGNOSIS — M25661 Stiffness of right knee, not elsewhere classified: Secondary | ICD-10-CM | POA: Diagnosis not present

## 2020-02-04 DIAGNOSIS — M25561 Pain in right knee: Secondary | ICD-10-CM | POA: Diagnosis not present

## 2020-02-09 ENCOUNTER — Other Ambulatory Visit: Payer: Self-pay

## 2020-02-09 ENCOUNTER — Ambulatory Visit
Admission: RE | Admit: 2020-02-09 | Discharge: 2020-02-09 | Disposition: A | Discharge status: Home / Self Care | Payer: Medicare PPO | Source: Ambulatory Visit | Attending: Internal Medicine | Admitting: Internal Medicine

## 2020-02-09 DIAGNOSIS — M25561 Pain in right knee: Secondary | ICD-10-CM | POA: Diagnosis not present

## 2020-02-09 DIAGNOSIS — M8588 Other specified disorders of bone density and structure, other site: Secondary | ICD-10-CM | POA: Diagnosis not present

## 2020-02-09 DIAGNOSIS — Z8781 Personal history of (healed) traumatic fracture: Secondary | ICD-10-CM

## 2020-02-09 DIAGNOSIS — M810 Age-related osteoporosis without current pathological fracture: Secondary | ICD-10-CM | POA: Diagnosis not present

## 2020-02-09 DIAGNOSIS — M25661 Stiffness of right knee, not elsewhere classified: Secondary | ICD-10-CM | POA: Diagnosis not present

## 2020-02-11 DIAGNOSIS — M25561 Pain in right knee: Secondary | ICD-10-CM | POA: Diagnosis not present

## 2020-02-11 DIAGNOSIS — M25661 Stiffness of right knee, not elsewhere classified: Secondary | ICD-10-CM | POA: Diagnosis not present

## 2020-02-16 DIAGNOSIS — M25561 Pain in right knee: Secondary | ICD-10-CM | POA: Diagnosis not present

## 2020-02-16 DIAGNOSIS — M25661 Stiffness of right knee, not elsewhere classified: Secondary | ICD-10-CM | POA: Diagnosis not present

## 2020-02-18 DIAGNOSIS — M25661 Stiffness of right knee, not elsewhere classified: Secondary | ICD-10-CM | POA: Diagnosis not present

## 2020-02-18 DIAGNOSIS — M25561 Pain in right knee: Secondary | ICD-10-CM | POA: Diagnosis not present

## 2020-02-22 DIAGNOSIS — E890 Postprocedural hypothyroidism: Secondary | ICD-10-CM | POA: Diagnosis not present

## 2020-02-24 DIAGNOSIS — M25661 Stiffness of right knee, not elsewhere classified: Secondary | ICD-10-CM | POA: Diagnosis not present

## 2020-02-24 DIAGNOSIS — M25561 Pain in right knee: Secondary | ICD-10-CM | POA: Diagnosis not present

## 2020-02-24 IMAGING — CR DG CHEST 2V
2 series · 2 of 2 positions shown · non-contrast
Comparison: 10/17/2017

CLINICAL DATA: Preop admit for CABG

EXAM:
CHEST - 2 VIEW

[w chest pa]
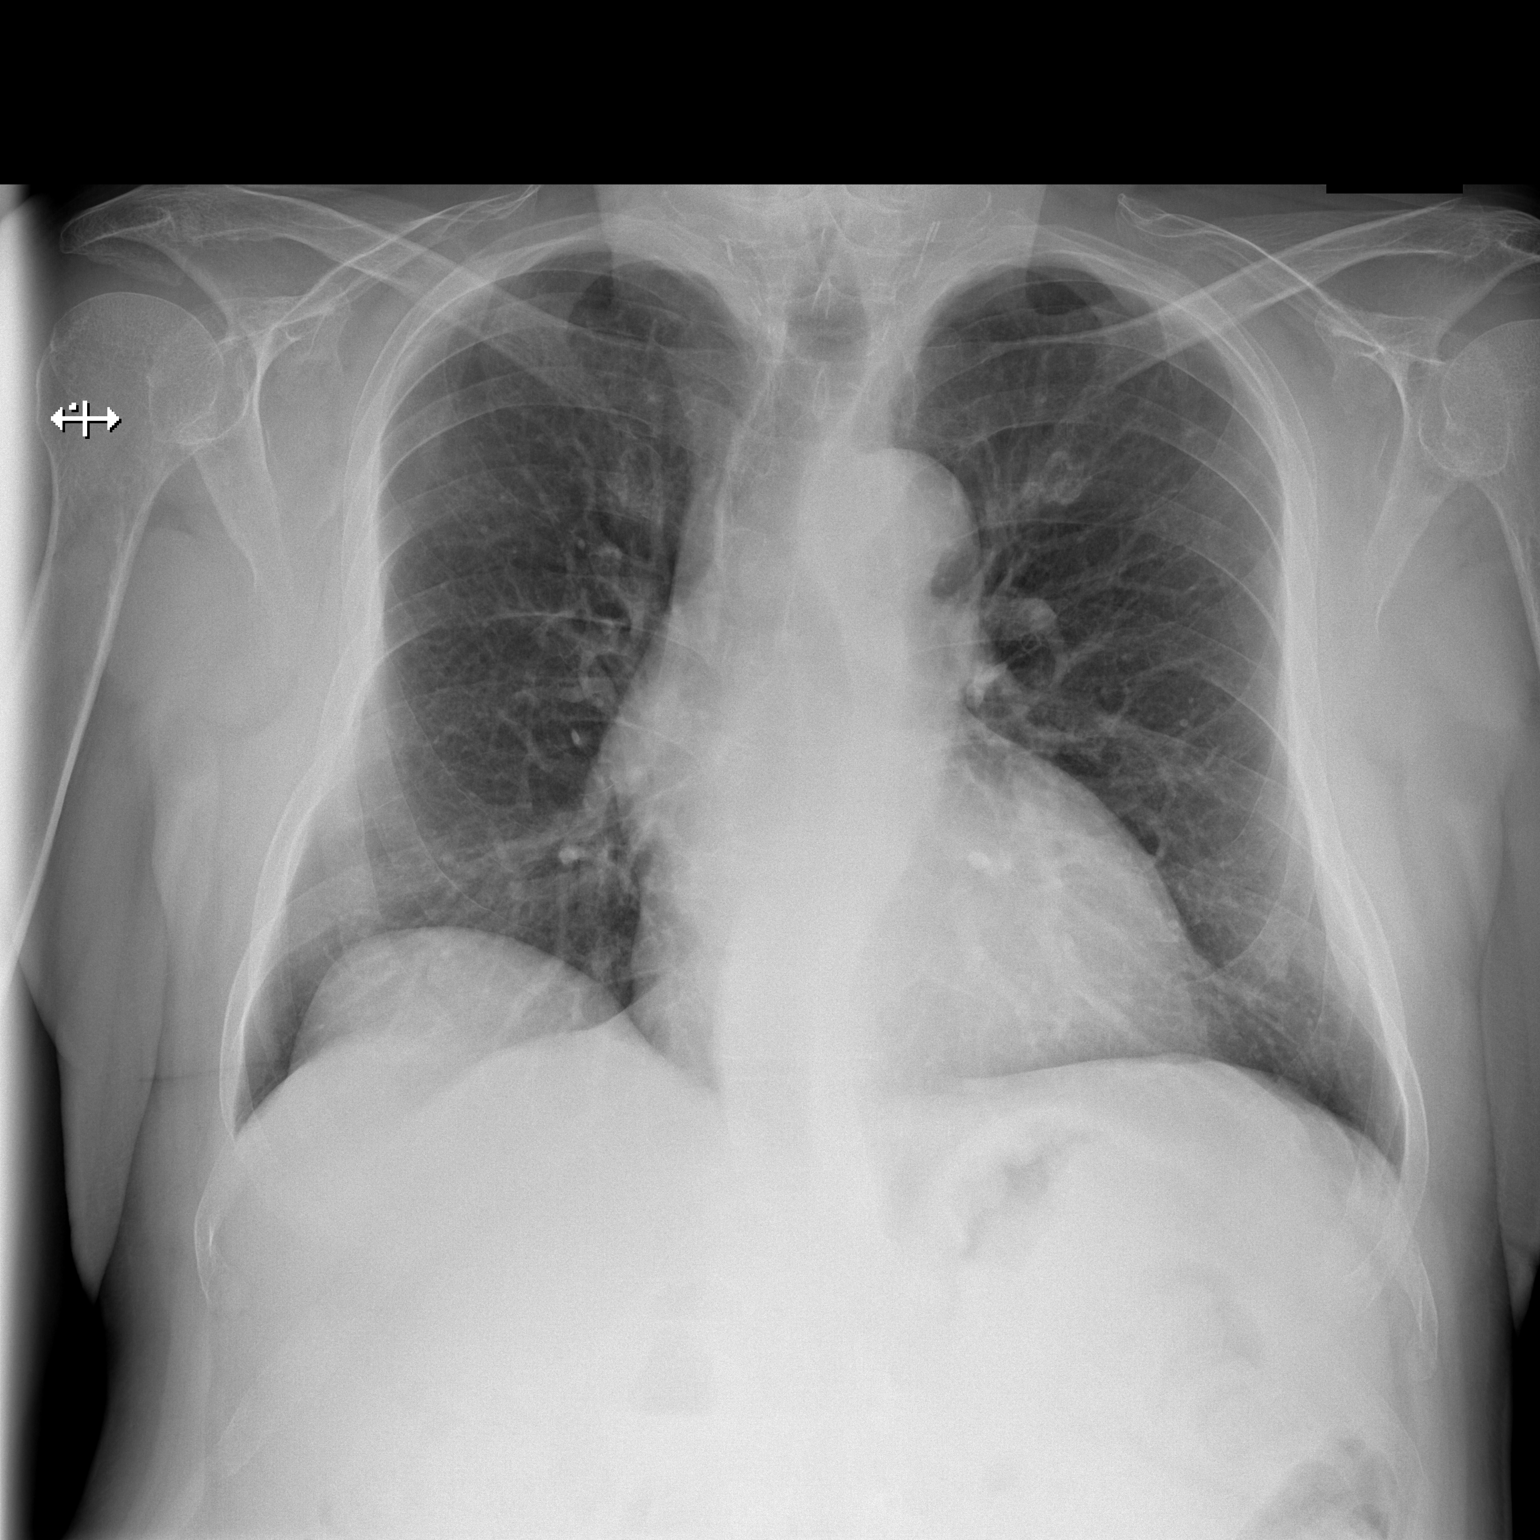

[w chest lat]
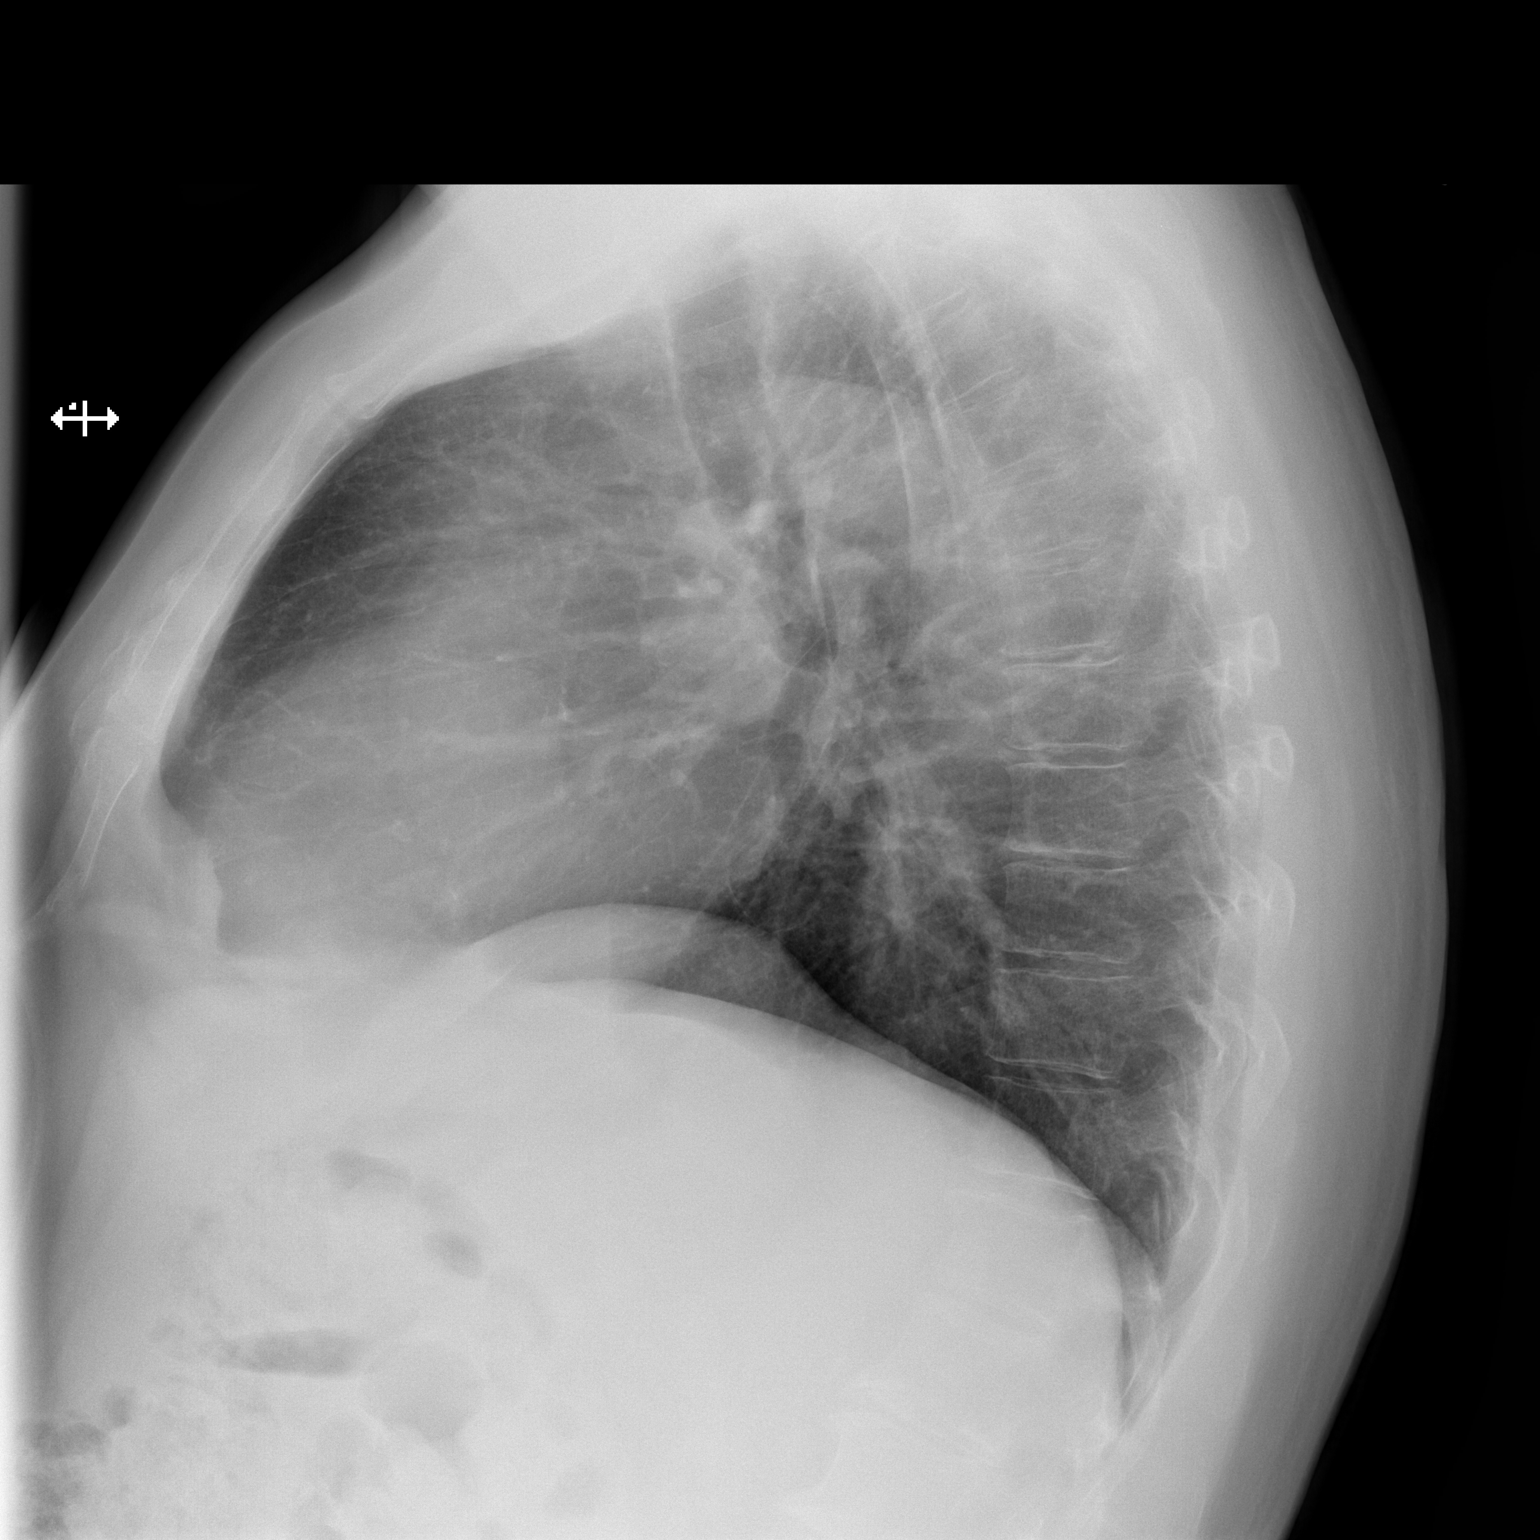

[2 of 2 positions shown; findings below may reference images not displayed]

FINDINGS: Lobulation of the right diaphragm. No acute opacity or pleural
effusion. Stable cardiomediastinal silhouette. No pneumothorax.
IMPRESSION: No active cardiopulmonary disease.

## 2020-02-26 DIAGNOSIS — M25561 Pain in right knee: Secondary | ICD-10-CM | POA: Diagnosis not present

## 2020-02-26 DIAGNOSIS — M25661 Stiffness of right knee, not elsewhere classified: Secondary | ICD-10-CM | POA: Diagnosis not present

## 2020-02-27 ENCOUNTER — Other Ambulatory Visit: Payer: Self-pay | Admitting: Cardiology

## 2020-02-29 DIAGNOSIS — I700 Atherosclerosis of aorta: Secondary | ICD-10-CM | POA: Diagnosis not present

## 2020-02-29 DIAGNOSIS — C649 Malignant neoplasm of unspecified kidney, except renal pelvis: Secondary | ICD-10-CM | POA: Diagnosis not present

## 2020-02-29 DIAGNOSIS — D4102 Neoplasm of uncertain behavior of left kidney: Secondary | ICD-10-CM | POA: Diagnosis not present

## 2020-02-29 DIAGNOSIS — K8020 Calculus of gallbladder without cholecystitis without obstruction: Secondary | ICD-10-CM | POA: Diagnosis not present

## 2020-02-29 DIAGNOSIS — Z85850 Personal history of malignant neoplasm of thyroid: Secondary | ICD-10-CM | POA: Diagnosis not present

## 2020-03-03 ENCOUNTER — Ambulatory Visit
Admission: RE | Admit: 2020-03-03 | Discharge: 2020-03-03 | Disposition: A | Discharge status: Home / Self Care | Payer: Medicare PPO | Source: Ambulatory Visit | Attending: Internal Medicine | Admitting: Internal Medicine

## 2020-03-03 ENCOUNTER — Other Ambulatory Visit: Payer: Self-pay | Admitting: Internal Medicine

## 2020-03-03 DIAGNOSIS — E1122 Type 2 diabetes mellitus with diabetic chronic kidney disease: Secondary | ICD-10-CM | POA: Diagnosis not present

## 2020-03-03 DIAGNOSIS — M549 Dorsalgia, unspecified: Secondary | ICD-10-CM | POA: Diagnosis not present

## 2020-03-03 DIAGNOSIS — Z794 Long term (current) use of insulin: Secondary | ICD-10-CM | POA: Diagnosis not present

## 2020-03-03 DIAGNOSIS — M810 Age-related osteoporosis without current pathological fracture: Secondary | ICD-10-CM

## 2020-03-03 DIAGNOSIS — E890 Postprocedural hypothyroidism: Secondary | ICD-10-CM | POA: Diagnosis not present

## 2020-03-03 DIAGNOSIS — S52502D Unspecified fracture of the lower end of left radius, subsequent encounter for closed fracture with routine healing: Secondary | ICD-10-CM | POA: Diagnosis not present

## 2020-03-03 DIAGNOSIS — Z8781 Personal history of (healed) traumatic fracture: Secondary | ICD-10-CM | POA: Diagnosis not present

## 2020-03-03 DIAGNOSIS — S92354D Nondisplaced fracture of fifth metatarsal bone, right foot, subsequent encounter for fracture with routine healing: Secondary | ICD-10-CM | POA: Diagnosis not present

## 2020-03-03 DIAGNOSIS — M545 Low back pain, unspecified: Secondary | ICD-10-CM | POA: Diagnosis not present

## 2020-03-03 DIAGNOSIS — N1831 Chronic kidney disease, stage 3a: Secondary | ICD-10-CM | POA: Diagnosis not present

## 2020-03-03 DIAGNOSIS — Z85850 Personal history of malignant neoplasm of thyroid: Secondary | ICD-10-CM | POA: Diagnosis not present

## 2020-03-08 DIAGNOSIS — M25561 Pain in right knee: Secondary | ICD-10-CM | POA: Diagnosis not present

## 2020-03-08 DIAGNOSIS — M25661 Stiffness of right knee, not elsewhere classified: Secondary | ICD-10-CM | POA: Diagnosis not present

## 2020-03-10 DIAGNOSIS — M25661 Stiffness of right knee, not elsewhere classified: Secondary | ICD-10-CM | POA: Diagnosis not present

## 2020-03-10 DIAGNOSIS — M25561 Pain in right knee: Secondary | ICD-10-CM | POA: Diagnosis not present

## 2020-03-14 DIAGNOSIS — D4102 Neoplasm of uncertain behavior of left kidney: Secondary | ICD-10-CM | POA: Diagnosis not present

## 2020-03-14 DIAGNOSIS — R350 Frequency of micturition: Secondary | ICD-10-CM | POA: Diagnosis not present

## 2020-03-14 DIAGNOSIS — N401 Enlarged prostate with lower urinary tract symptoms: Secondary | ICD-10-CM | POA: Diagnosis not present

## 2020-03-15 DIAGNOSIS — M25661 Stiffness of right knee, not elsewhere classified: Secondary | ICD-10-CM | POA: Diagnosis not present

## 2020-03-15 DIAGNOSIS — M25561 Pain in right knee: Secondary | ICD-10-CM | POA: Diagnosis not present

## 2020-03-17 DIAGNOSIS — M25561 Pain in right knee: Secondary | ICD-10-CM | POA: Diagnosis not present

## 2020-03-17 DIAGNOSIS — M25661 Stiffness of right knee, not elsewhere classified: Secondary | ICD-10-CM | POA: Diagnosis not present

## 2020-03-21 ENCOUNTER — Other Ambulatory Visit: Payer: Self-pay | Admitting: Urology

## 2020-03-21 DIAGNOSIS — N2889 Other specified disorders of kidney and ureter: Secondary | ICD-10-CM

## 2020-03-22 DIAGNOSIS — M25661 Stiffness of right knee, not elsewhere classified: Secondary | ICD-10-CM | POA: Diagnosis not present

## 2020-03-22 DIAGNOSIS — M25561 Pain in right knee: Secondary | ICD-10-CM | POA: Diagnosis not present

## 2020-03-23 ENCOUNTER — Other Ambulatory Visit: Payer: Self-pay

## 2020-03-23 ENCOUNTER — Ambulatory Visit
Admission: RE | Admit: 2020-03-23 | Discharge: 2020-03-23 | Disposition: A | Discharge status: Home / Self Care | Payer: Medicare PPO | Source: Ambulatory Visit | Attending: Urology | Admitting: Urology

## 2020-03-23 ENCOUNTER — Encounter: Payer: Self-pay | Admitting: *Deleted

## 2020-03-23 DIAGNOSIS — N2889 Other specified disorders of kidney and ureter: Secondary | ICD-10-CM | POA: Diagnosis not present

## 2020-03-23 HISTORY — PX: IR RADIOLOGIST EVAL & MGMT: IMG5224

## 2020-03-23 NOTE — Progress Notes (Signed)
Patient ID: Nathan Medina, male   DOB: 22-Apr-1951, 69 y.o.   MRN: [id number]       Chief Complaint:  Slow enlarging left renal mass  Referring Physician(s): Eskridge,Matthew  History of Present Illness: Nathan Medina is a 70 y.o. male with multiple comorbidities including hypertension, chronic kidney disease, diabetes, ischemic cardiomyopathy, status post bypass 2019.  He has also had a prior thyroidectomy.  He is referred today because of a slowly enlarging left renal midpole exophytic lesion.  Surveillance imaging dates back to 2016 when the renal lesion measured 1.6 cm.  He is also had an MRI scan in April 2019 confirming a slow enlarging 2.6 cm enhancing exophytic left renal lesion concerning for neoplasm.  Continue surveillance imaging most recently 02/29/2020 confirms continued slow enlargement of the lesion now just under 4 cm.  By imaging, the lesion is oval with smooth margins and very subtle enhancement.  Lesion is also solid by ultrasound.  Imaging findings are most compatible with a low-grade renal neoplasm such as a papillary renal cell carcinoma or oncocytoma.  Because of his multiple comorbidities and several prior surgeries he is interested in a more minimally invasive procedure and treatment such as cryoablation.  Past Medical History:  Diagnosis Date  . Abnormal liver function test 05/23/2011  . ALLERGIC RHINITIS   . ANXIETY   . Aortic atherosclerosis ([place])   . Arthritis   . Ascending aorta dilation ([facility]) 07/24/2018  . Bilateral renal cysts   . Cervical spondylolysis    Moderate  . CHF (congestive heart failure) ([place]) 07/24/2018  . Chronic kidney disease    CKD stage 3 per office visit note 08/08/17 on chart   . COLONIC POLYPS, HX OF   . Coronary artery disease   . DEPRESSION   . DIABETES MELLITUS, TYPE II   . Diverticulitis   . Epidermal cyst   . Fatty liver   . GERD   . GOUT   . History of inguinal hernia   . HOH (hard of hearing)    elft ear  .  HYPERLIPIDEMIA   . HYPERTENSION   . IBS   . 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 Ischemic cardiomyopathy 07/24/2018  . Left renal mass 05/23/2011  . Lumbar radicular pain 06/03/2011  . Macular degeneration    right eye  . Multinodular goiter   . Pancreatic pseudocyst    Stable  . Peritonsillar abscess   . Pulmonary nodule    Right upper lobe  . Radial neck fracture   . Right upper lobe pulmonary nodule 07/24/2018  . Shortness of breath    occasional shortness of breath  with exertion  . Thyroid disease   . Thyroid nodule    Bilateral  . Vertigo     Past Surgical History:  Procedure Laterality Date  . APPENDECTOMY  02/12/11  . COLON SURGERY  08/11/10   sigmoid colectomy  . COLON SURGERY  02/12/11   colostomy takedown  . COLONOSCOPY    . CORONARY ARTERY BYPASS GRAFT N/A 04/01/2018   Procedure: CORONARY ARTERY BYPASS GRAFTING (CABG) x Three, using left internal mammary artery and right leg greater saphenous vein harvested endoscopically;  Surgeon: Grace Isaac, MD;  Location: [place];  Service: Open Heart Surgery;  Laterality: N/A;  . CYST REMOVAL TRUNK Right 02/20/2018   Procedure: epidermal cyst excision right lower back;  Surgeon: [name], MD;  Location: [facility];  Service:  General;  Laterality: Right;  . HERNIA REPAIR    . INSERTION OF MESH N/A 08/20/2012   Procedure: INSERTION OF MESH;  Surgeon: [name], MD;  Location: [facility];  Service: General;  Laterality: N/A;  . KNEE ARTHROSCOPY Right 10/18/2019  . LEFT HEART CATH AND CORONARY ANGIOGRAPHY N/A 01/14/2018   Procedure: LEFT HEART CATH AND CORONARY ANGIOGRAPHY;  Surgeon: [name], MD;  Location: [place] [facility];  Service: Cardiovascular;  Laterality: N/A;  . LYSIS OF ADHESION  08/20/2012   Procedure: LYSIS OF ADHESION;  Surgeon: [name], MD;  Location: [facility];  Service: General;;  . QUADRICEPS TENDON REPAIR  Right 10/20/2019   Procedure: REPAIR RIGHT QUADRICEP TENDON;  Surgeon: Paralee Cancel, MD;  Location: [place];  Service: Orthopedics;  Laterality: Right;  . SEPTOPLASTY  age 37  . TEE WITHOUT CARDIOVERSION N/A 04/01/2018   Procedure: TRANSESOPHAGEAL ECHOCARDIOGRAM (TEE);  Surgeon: Grace Isaac, MD;  Location: [place];  Service: Open Heart Surgery;  Laterality: N/A;  . THYROIDECTOMY N/A 10/17/2017   Procedure: TOTAL THYROIDECTOMY;  Surgeon: [name], MD;  Location: [facility];  Service: General;  Laterality: N/A;  . TONSILLECTOMY Right 08/01/2016   Procedure: INCISION AND DRAINAGE RIGHT PERI-TONSILLAR ABSCESS;  Surgeon: [name], MD;  Location: [facility];  Service: ENT;  Laterality: Right;  . ULTRASOUND GUIDANCE FOR VASCULAR ACCESS  01/14/2018   Procedure: Ultrasound Guidance For Vascular Access;  Surgeon: [name], MD;  Location: [place] [facility];  Service: Cardiovascular;;  . VENTRAL HERNIA REPAIR  08/20/2012   Procedure: HERNIA REPAIR VENTRAL ADULT;  Surgeon: [name], MD;  Location[facility];  Service: General;;    Allergies: Ciprofloxacin, Escitalopram oxalate, Quinapril hcl, and Sulfa antibiotics  Medications: Prior to Admission medications   Medication Sig Start Date End Date Taking? Authorizing Provider  allopurinol (ZYLOPRIM) 300 MG tablet TAKE 1 TABLET BY MOUTH EVERY DAY Patient taking differently: Take 300 mg by mouth daily.  05/16/13   Biagio Borg, MD  ALPRAZolam Duanne Moron) 0.5 MG tablet Take 1 tablet (0.5 mg total) by mouth daily as needed for sleep. 1/2 - 1 by mouth once daily as needed Patient taking differently: Take 0.25-0.5 mg by mouth at bedtime as needed for sleep.  04/03/12   Biagio Borg, MD  aspirin EC 81 MG tablet Take 81 mg by mouth every other day.     [provider]  atorvastatin (LIPITOR) 10 MG tablet Take 10 mg by mouth at bedtime.    [provider]  calcium carbonate (TUMS) 500 MG chewable tablet Chew 2 tablets (400 mg of  elemental calcium total) by mouth 2 (two) times daily. Patient taking differently: Chew 2 tablets by mouth 2 (two) times daily as needed for indigestion.  10/18/17   [name], MD  carvedilol (COREG) 6.25 MG tablet Take 1 tablet (6.25 mg total) by mouth 2 (two) times daily. 05/05/19   Patwardhan, Reynold Bowen, MD  Cholecalciferol (VITAMIN D-3) 5000 units TABS Take 5,000 Units by mouth at bedtime.    [provider]  diphenhydrAMINE (BENADRYL) 25 MG tablet Take 25 mg by mouth at bedtime as needed for sleep.    [provider]  ENTRESTO 49-51 MG Take 1 tablet by mouth twice daily 02/29/20   Patwardhan, Reynold Bowen, MD  furosemide (LASIX) 40 MG tablet Take 40 mg by mouth every other day.     [provider]  guaiFENesin (MUCINEX) 600 MG 12  hr tablet Take 1,200 mg by mouth 2 (two) times daily as needed for cough or to loosen phlegm.    [provider]  insulin glargine (LANTUS) 100 unit/mL SOPN Inject 44 Units into the skin at bedtime.    [provider]  levothyroxine (SYNTHROID) 50 MCG tablet Take 125 mcg by mouth daily. Taking 2.5 tablets daily 10/04/19   [provider]  methocarbamol (ROBAXIN) 500 MG tablet Take 1 tablet (500 mg total) by mouth every 6 (six) hours as needed for muscle spasms. 10/24/19   Maurice March, PA-C  Multiple Vitamins-Minerals (PRESERVISION AREDS 2 PO) Take 1 tablet by mouth 2 (two) times daily.    [provider]  omega-3 acid ethyl esters (LOVAZA) 1 g capsule Take 2 g by mouth 2 (two) times daily.     [provider]  pantoprazole (PROTONIX) 40 MG tablet Take 40 mg by mouth 2 (two) times daily. 09/29/19   [provider]  rivaroxaban (XARELTO) 2.5 MG TABS tablet Take 1 tablet (2.5 mg total) by mouth 2 (two) times daily. 08/17/19   Patwardhan, Reynold Bowen, MD  sildenafil (REVATIO) 20 MG tablet Take 100 mg by mouth daily as needed.     [provider]  triamcinolone cream (KENALOG) 0.1 % Apply 1  application topically 2 (two) times daily as needed (skin irritation).    [provider]  TRULICITY 6.21 HY/8.6VH SOPN Inject 0.75 mg into the skin once a week. Monday 02/26/19   [provider]     Family History  Problem Relation Age of Onset  . Hypertension Paternal Aunt   . Stroke Maternal Grandmother   . Hypertension Maternal Grandmother     Social History   Socioeconomic History  . Marital status: Single    Spouse name: Not on file  . Number of children: 0  . Years of education: Not on file  . Highest education level: Not on file  Occupational History  . Not on file  Tobacco Use  . Smoking status: Never Smoker  . Smokeless tobacco: Never Used  Vaping Use  . Vaping Use: Never used  Substance and Sexual Activity  . Alcohol use: Yes    Comment: occ  . Drug use: No  . Sexual activity: Not on file  Other Topics Concern  . Not on file  Social History Narrative  . Not on file   Social Determinants of Health   Financial Resource Strain:   . Difficulty of Paying Living Expenses: Not on file  Food Insecurity:   . Worried About Charity fundraiser in the Last Year: Not on file  . Ran Out of Food in the Last Year: Not on file  Transportation Needs:   . Lack of Transportation (Medical): Not on file  . Lack of Transportation (Non-Medical): Not on file  Physical Activity:   . Days of Exercise per Week: Not on file  . Minutes of Exercise per Session: Not on file  Stress:   . Feeling of Stress : Not on file  Social Connections:   . Frequency of Communication with Friends and Family: Not on file  . Frequency of Social Gatherings with Friends and Family: Not on file  . Attends Religious Services: Not on file  . Active Member of Clubs or Organizations: Not on file  . Attends Archivist Meetings: Not on file  . Marital Status: Not on file     Review of Systems  Review of Systems: A 12  point ROS discussed and pertinent positives are indicated  in the HPI above.  All other systems are negative.  Physical Exam No direct physical exam was performed, telephone health visit only today because of Covid pandemic Vital Signs: There were no vitals taken for this visit.  Imaging: DG Lumbar Spine 2-3 Views  Result Date: 03/05/2020 CLINICAL DATA:  Atraumatic low back pain for 3 years EXAM: LUMBAR SPINE - 2-3 VIEW COMPARISON:  05/18/2011 FINDINGS: Frontal and lateral views of the lumbar spine demonstrate 5 non-rib-bearing lumbar type vertebral bodies with stable mild left convex scoliosis centered at L3. Otherwise alignment is anatomic. There is progressive spondylosis throughout the lumbar spine greatest at the L2-3 and L3-4 levels. No acute fractures. Sacroiliac joints are normal. Mild progressive atherosclerosis of the abdominal aorta. IMPRESSION: 1. Progressive mid lumbar spondylosis. 2. Stable mild left convex scoliosis. 3. No acute bony abnormality. Electronically Signed   By: Randa Ngo M.D.   On: 03/05/2020 09:06    Labs:  CBC: Recent Labs    10/18/19 1019 10/19/19 0343 10/21/19 0333 10/22/19 0657  WBC 8.8 8.5 6.1 12.1*  HGB 16.6 14.9 13.3 13.6  HCT 49.6 44.1 40.7 40.7  PLT 136* PLATELET CLUMPS NOTED ON SMEAR, UNABLE TO ESTIMATE 104* 125*    COAGS: No results for input(s): INR, APTT in the last 8760 hours.  BMP: Recent Labs    10/18/19 1019 10/19/19 0343 10/21/19 0333 10/22/19 0657  NA 142 141 138 138  K 3.7 3.6 4.1 4.0  CL 107 109 107 110  CO2 23 22 21* 20*  GLUCOSE 180* 137* 243* 188*  BUN 21 28* 31* 38*  CALCIUM 9.4 8.6* 8.9 8.9  CREATININE 1.35* 1.59* 1.35* 1.35*  GFRNONAA 53* 44* 53* 53*  GFRAA >60 51* >60 >60    LIVER FUNCTION TESTS: No results for input(s): BILITOT, AST, ALT, ALKPHOS, PROT, ALBUMIN in the last 8760 hours.   Assessment and Plan:  Slowly enlarging exophytic left midpole renal mass with subtle enhancement by CT and MRI.  Lesion is also solid by ultrasound.  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  Minimally invasive treatment options including CT-guided biopsy and cryoablation were reviewed in detail.  He understands the procedure does require general anesthesia but since this is a minimally invasive procedure often he can go home the same day once recovered.  All questions were addressed.  He is a full understanding of the procedure.  After discussion he would like to electively proceed with the cryoablation in January 2022 following the holidays.  Plan: Scheduled for elective CT-guided biopsy and cryoablation at [facility] in January 2022.  Thank you for this interesting consult.  I greatly enjoyed meeting Nathan Medina and look forward to participating in their care.  A copy of this report was sent to the requesting provider on this date.  Electronically Signed: Greggory Keen 03/23/2020, 9:28 AM   I spent a total of  40 Minutes   in remote  clinical consultation, greater than 50% of which was counseling/coordinating care for this patient with a left renal neoplasm.    Visit type: Audio only (telephone). Audio (no video) only due to patient's lack of internet/smartphone capability. Alternative for in-person consultation at [facility], [address] [place], [place], [place]. This visit type was conducted due to national recommendations for restrictions regarding the COVID-19 Pandemic (e.g. social distancing).  This format is felt to be most  appropriate for this patient at this time.  All issues noted in this document were discussed and addressed.

## 2020-03-24 ENCOUNTER — Other Ambulatory Visit: Payer: Self-pay | Admitting: Urology

## 2020-03-24 DIAGNOSIS — M25661 Stiffness of right knee, not elsewhere classified: Secondary | ICD-10-CM | POA: Diagnosis not present

## 2020-03-24 DIAGNOSIS — N2889 Other specified disorders of kidney and ureter: Secondary | ICD-10-CM

## 2020-03-24 DIAGNOSIS — M25561 Pain in right knee: Secondary | ICD-10-CM | POA: Diagnosis not present

## 2020-03-28 DIAGNOSIS — M25661 Stiffness of right knee, not elsewhere classified: Secondary | ICD-10-CM | POA: Diagnosis not present

## 2020-03-28 DIAGNOSIS — M25561 Pain in right knee: Secondary | ICD-10-CM | POA: Diagnosis not present

## 2020-04-07 ENCOUNTER — Other Ambulatory Visit: Payer: Self-pay | Admitting: Cardiothoracic Surgery

## 2020-04-07 DIAGNOSIS — M810 Age-related osteoporosis without current pathological fracture: Secondary | ICD-10-CM | POA: Diagnosis not present

## 2020-04-07 DIAGNOSIS — E890 Postprocedural hypothyroidism: Secondary | ICD-10-CM | POA: Diagnosis not present

## 2020-04-07 DIAGNOSIS — S92351D Displaced fracture of fifth metatarsal bone, right foot, subsequent encounter for fracture with routine healing: Secondary | ICD-10-CM | POA: Diagnosis not present

## 2020-04-07 DIAGNOSIS — E1122 Type 2 diabetes mellitus with diabetic chronic kidney disease: Secondary | ICD-10-CM | POA: Diagnosis not present

## 2020-04-07 DIAGNOSIS — Z951 Presence of aortocoronary bypass graft: Secondary | ICD-10-CM

## 2020-04-07 DIAGNOSIS — R911 Solitary pulmonary nodule: Secondary | ICD-10-CM

## 2020-04-07 DIAGNOSIS — S52502D Unspecified fracture of the lower end of left radius, subsequent encounter for closed fracture with routine healing: Secondary | ICD-10-CM | POA: Diagnosis not present

## 2020-04-08 DIAGNOSIS — M25561 Pain in right knee: Secondary | ICD-10-CM | POA: Diagnosis not present

## 2020-04-08 DIAGNOSIS — M25661 Stiffness of right knee, not elsewhere classified: Secondary | ICD-10-CM | POA: Diagnosis not present

## 2020-04-13 DIAGNOSIS — M810 Age-related osteoporosis without current pathological fracture: Secondary | ICD-10-CM | POA: Diagnosis not present

## 2020-04-13 DIAGNOSIS — Z85850 Personal history of malignant neoplasm of thyroid: Secondary | ICD-10-CM | POA: Diagnosis not present

## 2020-04-13 DIAGNOSIS — E1122 Type 2 diabetes mellitus with diabetic chronic kidney disease: Secondary | ICD-10-CM | POA: Diagnosis not present

## 2020-04-13 DIAGNOSIS — Z794 Long term (current) use of insulin: Secondary | ICD-10-CM | POA: Diagnosis not present

## 2020-04-13 DIAGNOSIS — E890 Postprocedural hypothyroidism: Secondary | ICD-10-CM | POA: Diagnosis not present

## 2020-04-13 DIAGNOSIS — Z8781 Personal history of (healed) traumatic fracture: Secondary | ICD-10-CM | POA: Diagnosis not present

## 2020-04-20 DIAGNOSIS — M25661 Stiffness of right knee, not elsewhere classified: Secondary | ICD-10-CM | POA: Diagnosis not present

## 2020-04-20 DIAGNOSIS — M25561 Pain in right knee: Secondary | ICD-10-CM | POA: Diagnosis not present

## 2020-04-21 DIAGNOSIS — K118 Other diseases of salivary glands: Secondary | ICD-10-CM | POA: Diagnosis not present

## 2020-05-05 ENCOUNTER — Other Ambulatory Visit: Payer: Self-pay

## 2020-05-05 ENCOUNTER — Ambulatory Visit
Admission: RE | Admit: 2020-05-05 | Discharge: 2020-05-05 | Disposition: A | Discharge status: Home / Self Care | Payer: Medicare PPO | Source: Ambulatory Visit | Attending: Cardiothoracic Surgery | Admitting: Cardiothoracic Surgery

## 2020-05-05 ENCOUNTER — Ambulatory Visit: Payer: Medicare PPO | Admitting: Cardiothoracic Surgery

## 2020-05-05 VITALS — BP 110/70 | HR 60 | Temp 97.6°F | Resp 20 | Ht 67.0 in | Wt 213.0 lb

## 2020-05-05 DIAGNOSIS — E890 Postprocedural hypothyroidism: Secondary | ICD-10-CM | POA: Diagnosis not present

## 2020-05-05 DIAGNOSIS — C73 Malignant neoplasm of thyroid gland: Secondary | ICD-10-CM | POA: Diagnosis not present

## 2020-05-05 DIAGNOSIS — R911 Solitary pulmonary nodule: Secondary | ICD-10-CM | POA: Diagnosis not present

## 2020-05-05 DIAGNOSIS — I2510 Atherosclerotic heart disease of native coronary artery without angina pectoris: Secondary | ICD-10-CM | POA: Diagnosis not present

## 2020-05-05 DIAGNOSIS — Z951 Presence of aortocoronary bypass graft: Secondary | ICD-10-CM

## 2020-05-05 DIAGNOSIS — I700 Atherosclerosis of aorta: Secondary | ICD-10-CM | POA: Diagnosis not present

## 2020-05-05 NOTE — Progress Notes (Signed)
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 POSTOPERATIVE DIAGNOSIS:  Coronary occlusive disease with decreased left ventricular function and high risk stress test. SURGICAL PROCEDURE:  Coronary artery bypass grafting x3 with the left internal mammary to the left anterior descending coronary artery, reverse saphenous vein graft to the distal right coronary artery, reverse saphenous vein graft to the ramus  intermedius with right thigh greater saphenous endoscopic vein harvesting and cardiopulmonary bypass. SURGEON:  Lanelle Bal, MD  History of Present Illness:     Patient seen today in follow-up after coronary artery bypass grafting November 2019 and follow up for history of groundglass opacity right upper lung.  Patient was originally seen for the incidental finding of a groundglass opacity in his right upper lobe.  This is been noticed on CT scan in March 2019, and again repeated evaluation in August 2019.  During his evaluation he was also referred to urology and is been followed for abnormal CT of the kidney.  He then underwent cardiac catheterization was found to have significantly decreased LV function and severe three-vessel coronary artery disease and underwent coronary artery bypass grafting x3 April 01, 2018.  He notes still episodes of pedal edema.  Patient is a lifelong non-smoker.       Zubrod Score: At the time of surgery this patient's most appropriate activity status/level should be described as: []     0    Normal activity, no symptoms [x]     1    Restricted in physical strenuous activity but ambulatory, able to do out light work []     2    Ambulatory and capable of self care, unable to do work activities, up and about                 >50 % of waking hours                                                                                   []     3    Only limited self care, in bed greater than 50% of waking hours []     4    Completely disabled, no self care, confined to bed or chair []     5    Moribund  Social History   Tobacco Use  Smoking Status Never Smoker  Smokeless Tobacco Never Used       Allergies  Allergen Reactions  . Ciprofloxacin Rash  . Escitalopram Oxalate Itching  . Quinapril Hcl Rash  . Sulfa Antibiotics  Swelling    Swelling in the ankles    Current Outpatient Medications  Medication Sig Dispense Refill  . allopurinol (ZYLOPRIM) 300 MG tablet TAKE 1 TABLET BY MOUTH EVERY DAY (Patient taking differently: Take 300 mg by mouth daily. ) 90 tablet 0  . ALPRAZolam (XANAX) 0.5 MG tablet Take 1 tablet (0.5 mg total) by mouth daily as needed for sleep. 1/2 - 1 by mouth once daily as needed (Patient taking differently: Take 0.25-0.5 mg by mouth at bedtime as needed for sleep. ) 90 tablet 2  . aspirin EC 81 MG tablet Take 81 mg by mouth every other day.     [name] atorvastatin (LIPITOR) 10 MG tablet Take 10 mg by mouth at bedtime.    . calcium carbonate (TUMS) 500 MG chewable tablet Chew 2 tablets (400 mg of elemental calcium total) by mouth 2 (two) times daily. (Patient taking differently: Chew 2 tablets by mouth 2 (two) times daily as needed for indigestion. ) 90 tablet 1  . carvedilol (COREG) 6.25 MG tablet Take 1 tablet (6.25 mg total) by mouth 2 (two) times daily. 180 tablet 3  . Cholecalciferol (VITAMIN D-3) 5000 units TABS Take 5,000 Units by mouth at  bedtime.    . diphenhydrAMINE (BENADRYL) 25 MG tablet Take 25 mg by mouth at bedtime as needed for sleep.    [name] ENTRESTO 49-51 MG Take 1 tablet by mouth twice daily 180 tablet 0  . furosemide (LASIX) 40 MG tablet Take 40 mg by mouth every other day.     [name] guaiFENesin (MUCINEX) 600 MG 12 hr tablet Take 1,200 mg by mouth 2 (two) times daily as needed for cough or to loosen phlegm.    . insulin glargine (LANTUS) 100 unit/mL SOPN Inject 44 Units into the skin at bedtime.    [name] levothyroxine (SYNTHROID) 50 MCG tablet Take 125 mcg by mouth daily. Taking 2.5 tablets daily    . methocarbamol (ROBAXIN) 500 MG tablet Take 1 tablet (500 mg total) by mouth every 6 (six) hours as needed for muscle spasms. 30 tablet 0  . Multiple Vitamins-Minerals (PRESERVISION AREDS 2 PO) Take 1 tablet by mouth 2 (two) times daily.    [name] omega-3 acid ethyl esters (LOVAZA) 1 g capsule Take 2 g by mouth 2 (two) times daily.     . pantoprazole (PROTONIX) 40 MG tablet Take 40 mg by mouth 2 (two) times daily.    . rivaroxaban (XARELTO) 2.5 MG TABS tablet Take 1 tablet (2.5 mg total) by mouth 2 (two) times daily. 180 tablet 03  . sildenafil (REVATIO) 20 MG tablet Take 100 mg by mouth daily as needed.     . triamcinolone cream (KENALOG) 0.1 % Apply 1 application topically 2 (two) times daily as needed (skin irritation).    . TRULICITY 5.46 EV/0.3JK SOPN Inject 0.75 mg into the skin once a week. Monday     No current facility-administered medications for this visit.       Physical Exam: BP 110/70   Pulse 60   Temp 97.6 F (36.4 C) (Skin)   Resp 20   Ht 5\' 7"  (1.702 m)   Wt 213 lb (96.6 kg)   SpO2 97% Comment: RA  BMI 33.36 kg/m  General appearance: alert Head: Normocephalic, without obvious abnormality, atraumatic Neck: no adenopathy, no carotid bruit, no JVD, supple, symmetrical, trachea midline and thyroid not enlarged, symmetric, no tenderness/mass/nodules Back: negative, symmetric, no curvature. ROM normal. No CVA  tenderness. Cardio: regular rate and rhythm, S1,  S2 normal, no murmur, click, rub or gallop GI: soft, non-tender; bowel sounds normal; no masses,  no organomegaly Extremities: edema mild pedal edema bilaterial Neurologic: Grossly normal   Diagnostic Studies & Laboratory data:         Recent Radiology Findings: CT Super D Chest Wo Contrast  Result Date: 05/05/2020 CLINICAL DATA:  Thyroid cancer, status post thyroidectomy EXAM: CT CHEST WITHOUT CONTRAST TECHNIQUE: Multidetector CT imaging of the chest was performed using thin slice collimation for electromagnetic bronchoscopy planning purposes, without intravenous contrast. COMPARISON:  09/10/2019 FINDINGS: Cardiovascular: Aortic atherosclerosis. Normal heart size. Three-vessel coronary artery calcifications. No pericardial effusion. Mediastinum/Nodes: No enlarged mediastinal, hilar, or axillary lymph nodes. Status post thyroidectomy. No evidence of locally recurrent soft tissue or lymphadenopathy in the partially imaged neck. Trachea, and esophagus demonstrate no significant findings. Lungs/Pleura: No significant interval change in a ground-glass nodule of the posterior right pulmonary apex measuring 1.8 x 1.7 cm (series 8, image 21). No pleural effusion or pneumothorax. Upper Abdomen: No acute abnormality. Unchanged, partially exophytic cystic lesion of the lateral midportion of the right kidney. Gallstones in the gallbladder. Musculoskeletal: No chest wall mass or suspicious bone lesions identified. IMPRESSION: 1. No significant interval change in a ground-glass nodule of the posterior right pulmonary apex measuring 1.8 x 1.7 cm. Findings remain moderately concerning for adenocarcinoma although nonspecific. Recommend additional follow-up at 12 months to assess for stability. 2. Status post thyroidectomy. No evidence of locally recurrent soft tissue or lymphadenopathy in the partially imaged neck. Morphology of pulmonary nodule above is not consistent  with metastatic thyroid cancer. 3. Coronary artery disease. Aortic Atherosclerosis (ICD10-I70.0). Electronically Signed   By: Eddie Candle M.D.   On: 05/05/2020 11:44   CT CHEST WO CONTRAST  Result Date: 09/10/2019 CLINICAL DATA:  69 year old male with history of pulmonary nodule. EXAM: CT CHEST WITHOUT CONTRAST TECHNIQUE: Multidetector CT imaging of the chest was performed following the standard protocol without IV contrast. COMPARISON:  Chest CT 09/04/2018. FINDINGS: Cardiovascular: Heart size is normal. There is no significant pericardial fluid, thickening or pericardial calcification. There is aortic atherosclerosis, as well as atherosclerosis of the great vessels of the mediastinum and the coronary arteries, including calcified atherosclerotic plaque in the left main, left anterior descending, left circumflex and right coronary arteries. Status post median sternotomy for CABG including LIMA to the LAD. Calcifications of the aortic valve. Mediastinum/Nodes: No pathologically enlarged mediastinal or hilar lymph nodes. Please note that accurate exclusion of hilar adenopathy is limited on noncontrast CT scans. Esophagus is unremarkable in appearance. No axillary lymphadenopathy. Lungs/Pleura: Ground-glass attenuation nodule in the right upper lobe near the apex (axial image 27 of series 8) currently measuring 1.8 x 1.7 cm with no central solid component. No other suspicious appearing pulmonary nodules or masses are noted. No acute consolidative airspace disease. No pleural effusions. Upper Abdomen: Small calcified gallstones lying dependently in the gallbladder. Incompletely imaged exophytic low-attenuation lesion in the interpolar region of the right kidney measuring up to 5.5 cm in diameter, not characterized on today's non-contrast CT examination, but similar to the prior study and statistically likely to represent a cyst. 2.7 cm low-attenuation lesion in the body of the pancreas, similar to the prior  examination, also incompletely characterized on today's noncontrast CT examination. Aortic atherosclerosis. Musculoskeletal: Median sternotomy wires. There are no aggressive appearing lytic or blastic lesions noted in the visualized portions of the skeleton. IMPRESSION: 1. Slight growth of a ground-glass attenuation nodule in the right upper lobe which currently measures 1.8 x  1.7 cm. No central solid component. Repeat noncontrast chest CT is recommended in 12 months to re-evaluate this lesion. 2. There are calcifications of the aortic valve. Echocardiographic correlation for evaluation of potential valvular dysfunction may be warranted if clinically indicated. 3. Aortic atherosclerosis, in addition to left main and 3 vessel coronary artery disease. Status post median sternotomy for CABG including LIMA to the LAD. 4. Cholelithiasis without evidence of acute cholecystitis at this time. 5. Additional incidental findings, similar to prior studies, as above. Electronically Signed   By: Vinnie Langton M.D.   On: 09/10/2019 12:23    Ct Chest Wo Contrast  Result Date: 09/04/2018 CLINICAL DATA:  Follow-up lung nodule EXAM: CT CHEST WITHOUT CONTRAST TECHNIQUE: Multidetector CT imaging of the chest was performed following the standard protocol without IV contrast. COMPARISON:  PET-CT, 01/01/2018, 08/26/2017, CT chest, 08/06/2017, CT abdomen pelvis, 06/11/2018 FINDINGS: Cardiovascular: Three-vessel coronary artery calcifications. Normal heart size. No pericardial effusion. Mediastinum/Nodes: No enlarged mediastinal, hilar, or axillary lymph nodes. Thyroid gland, trachea, and esophagus demonstrate no significant findings. Lungs/Pleura: No change in a ground-glass nodule of the posterior right upper lobe measuring approximately 1.5 cm (series 3, image 31). No pleural effusion or pneumothorax. Upper Abdomen: No acute abnormality. Partially imaged mass of the left kidney (series 2, image 178), previously characterized as  suspicious for malignancy by CT. Gallstones. Cystic lesion of the pancreatic tail measuring 2.9 cm (series 2, image 24). Musculoskeletal: No chest wall mass or suspicious bone lesions identified. IMPRESSION: 1. No change in a ground-glass nodule of the posterior right upper lobe measuring approximately 1.5 cm (series 3, image 31). 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 Recommend additional follow-up in 1 year and annually through 5 years of stability. 2. Partially imaged mass of the left kidney (series 2, image 178), previously characterized as suspicious for malignancy by CT. Cystic lesion of the pancreatic tail measuring 2.9 cm (series 2, image 24). 3.  Coronary artery disease. Electronically Signed   By: Eddie Candle M.D.   On: 09/04/2018 10:40    Recent Labs: Lab Results  Component Value Date   WBC 12.1 (H) 10/22/2019   HGB 13.6 10/22/2019   HCT 40.7 10/22/2019   PLT 125 (L) 10/22/2019   GLUCOSE 188 (H) 10/22/2019   CHOL 112 07/01/2018   TRIG 134 07/01/2018   HDL 32 (L) 07/01/2018   LDLDIRECT 84.5 04/01/2012   LDLCALC 53 07/01/2018   ALT 30 04/05/2018   AST 24 04/05/2018   NA 138 10/22/2019   K 4.0 10/22/2019   CL 110 10/22/2019   CREATININE 1.35 (H) 10/22/2019   BUN 38 (H) 10/22/2019   CO2 20 (L) 10/22/2019   TSH 2.28 04/01/2012   INR 1.55 04/01/2018   HGBA1C 7.1 (H) 10/19/2019      Assessment / Plan:   #1 status post coronary artery bypass grafting, with known underlying depressed LV function preoperative echocardiogram showed 45 to 45% ejection fraction currently patient stable from a cardiac standpoint without recurrent symptoms or evidence of congestive heart failure.  #2 history of groundglass opacity right upper lung-No significant interval change in a ground-glass nodule of the posterior right pulmonary apex measuring 1.8 x 1.7 cm. Findings remain moderately concerning for adenocarcinoma although nonspecific.  Recommend additional follow-up at 12 months to assess for stability.    #3 patient notes that he continues to have follow-up through urology for   2.6 cm enhancing lesion at lateral left lower kidney-followed by urology -Scheduled  for elective CT-guided biopsy and cryoablation at [facility] in January 2022.     Grace Isaac 05/05/2020 2:10 PM

## 2020-05-19 DIAGNOSIS — E890 Postprocedural hypothyroidism: Secondary | ICD-10-CM | POA: Diagnosis not present

## 2020-06-07 ENCOUNTER — Other Ambulatory Visit: Payer: Self-pay | Admitting: Cardiology

## 2020-06-10 DIAGNOSIS — H40023 Open angle with borderline findings, high risk, bilateral: Secondary | ICD-10-CM | POA: Diagnosis not present

## 2020-06-10 DIAGNOSIS — E113292 Type 2 diabetes mellitus with mild nonproliferative diabetic retinopathy without macular edema, left eye: Secondary | ICD-10-CM | POA: Diagnosis not present

## 2020-06-10 DIAGNOSIS — H353112 Nonexudative age-related macular degeneration, right eye, intermediate dry stage: Secondary | ICD-10-CM | POA: Diagnosis not present

## 2020-06-13 ENCOUNTER — Other Ambulatory Visit (HOSPITAL_COMMUNITY): Payer: Self-pay | Admitting: Interventional Radiology

## 2020-06-13 DIAGNOSIS — N2889 Other specified disorders of kidney and ureter: Secondary | ICD-10-CM

## 2020-06-14 NOTE — Patient Instructions (Addendum)
DUE TO COVID-19 ONLY ONE VISITOR IS ALLOWED TO COME WITH YOU AND STAY IN THE WAITING ROOM ONLY DURING PRE OP AND PROCEDURE DAY OF SURGERY. THE 1 VISITOR  MAY VISIT WITH YOU AFTER SURGERY IN YOUR PRIVATE ROOM DURING VISITING HOURS ONLY!  YOU NEED TO HAVE A COVID 19 TEST ON_1/29______ @_10 :15______, THIS TEST MUST BE DONE BEFORE SURGERY,  COVID TESTING SITE [address]  [id number], IT IS ON THE RIGHT GOING OUT [address] APPROXIMATELY  2 MINUTES PAST [employer] ON THE RIGHT. ONCE YOUR COVID TEST IS COMPLETED,  PLEASE BEGIN THE QUARANTINE INSTRUCTIONS AS OUTLINED IN YOUR HANDOUT.                Nathan Medina    Your procedure is scheduled on: 06/22/20   Report to [facility] Main  Entrance   Report to Radiology at 10:00 AM     Call this number if you have problems the morning of surgery [phone]    Remember: Do not eat food or drink liquids :After Midnight.   BRUSH YOUR TEETH MORNING OF SURGERY AND RINSE YOUR MOUTH OUT, NO CHEWING GUM CANDY OR MINTS.     Take these medicines the morning of surgery with A SIP OF WATER: Carvedilol, Entrestro,Allopurinol, Levothyroxine, Pantoprazole  DO NOT TAKE ANY DIABETIC MEDICATIONS DAY OF YOUR SURGERY   How to Manage Your Diabetes Before and After Surgery  Why is it important to control my blood sugar before and after surgery? . Improving blood sugar levels before and after surgery helps healing and can limit problems. . A way of improving blood sugar control is eating a healthy diet by: o  Eating less sugar and carbohydrates o  Increasing activity/exercise o  Talking with your doctor about reaching your blood sugar goals . High blood sugars (greater than 180 mg/dL) can raise your risk of infections and slow your recovery, so you will need to focus on controlling your diabetes during the weeks before surgery. . Make sure that the doctor who takes care of your diabetes knows about your planned surgery  including the date and location.  How do I manage my blood sugar before surgery? . Check your blood sugar at least 4 times a day, starting 2 days before surgery, to make sure that the level is not too high or low. o Check your blood sugar the morning of your surgery when you wake up and every 2 hours until you get to the Short Stay unit. . If your blood sugar is less than 70 mg/dL, you will need to treat for low blood sugar: o Do not take insulin. o Treat a low blood sugar (less than 70 mg/dL) with  cup of clear juice (cranberry or apple), 4 glucose tablets, OR glucose gel. o Recheck blood sugar in 15 minutes after treatment (to make sure it is greater than 70 mg/dL). If your blood sugar is not greater than 70 mg/dL on recheck, call [phone] for further instructions. . Report your blood sugar to the short stay nurse when you get to Short Stay.  . If you are admitted to the hospital after surgery: o Your blood sugar will be checked by the staff and you will probably be given insulin after surgery (instead of oral diabetes medicines) to make sure you have good blood sugar levels. o The goal for blood sugar control after surgery is 80-180 mg/dL.   WHAT DO I DO ABOUT MY DIABETES MEDICATION?  [name]  Do not take oral diabetes medicines (pills) the morning of surgery.  . THE NIGHT BEFORE SURGERY, take 22  units of lantus   insulin.       . THE MORNING OF SURGERY, take 0  units of  insulin.  . The day of surgery, do not take other diabetes injectables, including Byetta (exenatide), Bydureon (exenatide ER), Victoza (liraglutide), or Trulicity (dulaglutide).                   You may not have any metal on your body including              piercings  Do not wear jewelry, lotions, powders or deodorant                          Men may shave face and neck.   Do not bring valuables to the hospital. [facility] IS NOT             RESPONSIBLE   FOR VALUABLES.  Contacts, dentures or bridgework may  not be worn into surgery.       Pati Special Instructions: N/A              Please read over the following fact sheets you were given: _____________________________________________________________________             [facility] - Preparing for Surgery Before surgery, you can play an important role.   Because skin is not sterile, your skin needs to be as free of germs as possible .  You can reduce the number of germs on your skin by washing with CHG (chlorahexidine gluconate) soap before surgery.   CHG is an antiseptic cleaner which kills germs and bonds with the skin to continue killing germs even after washing. Please DO NOT use if you have an allergy to CHG or antibacterial soaps.   If your skin becomes reddened/irritated stop using the CHG and inform your nurse when you arrive at Short Stay. You may shave your face/neck.  Please follow these instructions carefully:  1.  Shower with CHG Soap the night before surgery and the  morning of Surgery.  2.  If you choose to wash your hair, wash your hair first as usual with your  normal  shampoo.  3.  After you shampoo, rinse your hair and body thoroughly to remove the  shampoo.                                        4.  Use CHG as you would any other liquid soap.  You can apply chg directly  to the skin and wash                       Gently with a scrungie or clean washcloth.  5.  Apply the CHG Soap to your body ONLY FROM THE NECK DOWN.   Do not use on face/ open                           Wound or open sores. Avoid contact with eyes, ears mouth and genitals (private parts).                       Wash face,  Genitals (private parts) with your normal soap.  6.  Wash thoroughly, paying special attention to the area where your surgery  will be performed.  7.  Thoroughly rinse your body with warm water from the neck down.  8.  DO NOT shower/wash with your normal soap after using and rinsing off  the CHG Soap.             9.  Pat  yourself dry with a clean towel.            10.  Wear clean pajamas.            11.  Place clean sheets on your bed the night of your first shower and do not  sleep with pets. Day of Surgery : Do not apply any lotions/deodorants the morning of surgery.  Please wear clean clothes to the hospital/surgery center.  FAILURE TO FOLLOW THESE INSTRUCTIONS MAY RESULT IN THE CANCELLATION OF YOUR SURGERY PATIENT SIGNATURE_________________________________  NURSE SIGNATURE__________________________________  ________________________________________________________________________

## 2020-06-15 ENCOUNTER — Encounter (HOSPITAL_COMMUNITY): Payer: Self-pay

## 2020-06-15 ENCOUNTER — Other Ambulatory Visit: Payer: Self-pay

## 2020-06-15 ENCOUNTER — Encounter (HOSPITAL_COMMUNITY)
Admission: RE | Admit: 2020-06-15 | Discharge: 2020-06-15 | Disposition: A | Discharge status: Home / Self Care | Payer: Medicare PPO | Source: Ambulatory Visit | Attending: Interventional Radiology | Admitting: Interventional Radiology

## 2020-06-15 DIAGNOSIS — Z01812 Encounter for preprocedural laboratory examination: Secondary | ICD-10-CM | POA: Diagnosis not present

## 2020-06-15 HISTORY — DX: Acute myocardial infarction, unspecified: I21.9

## 2020-06-15 HISTORY — DX: Peripheral vascular disease, unspecified: I73.9

## 2020-06-15 LAB — BASIC METABOLIC PANEL
Anion gap: 11 (ref 5–15)
BUN: 29 mg/dL — ABNORMAL HIGH (ref 8–23)
CO2: 26 mmol/L (ref 22–32)
Calcium: 9.4 mg/dL (ref 8.9–10.3)
Chloride: 104 mmol/L (ref 98–111)
Creatinine, Ser: 1.3 mg/dL — ABNORMAL HIGH (ref 0.61–1.24)
GFR, Estimated: 59 mL/min — ABNORMAL LOW (ref >60)
Glucose, Bld: 107 mg/dL — ABNORMAL HIGH (ref 70–99)
Potassium: 4.2 mmol/L (ref 3.5–5.1)
Sodium: 141 mmol/L (ref 135–145)

## 2020-06-15 LAB — HEMOGLOBIN A1C
Hgb A1c MFr Bld: 6.6 % — ABNORMAL HIGH (ref 4.8–5.6)
Mean Plasma Glucose: 142.72 mg/dL

## 2020-06-15 LAB — CBC
HCT: 47.8 % (ref 39.0–52.0)
Hemoglobin: 16 g/dL (ref 13.0–17.0)
MCH: 32.4 pg (ref 26.0–34.0)
MCHC: 33.5 g/dL (ref 30.0–36.0)
MCV: 96.8 fL (ref 80.0–100.0)
Platelets: 121 10*3/uL — ABNORMAL LOW (ref 150–400)
RBC: 4.94 MIL/uL (ref 4.22–5.81)
RDW: 13.3 % (ref 11.5–15.5)
WBC: 6.6 10*3/uL (ref 4.0–10.5)
nRBC: 0 % (ref 0.0–0.2)

## 2020-06-15 LAB — GLUCOSE, CAPILLARY: Glucose-Capillary: 101 mg/dL — ABNORMAL HIGH (ref 70–99)

## 2020-06-15 MED ORDER — LACTATED RINGERS IV SOLN
INTRAVENOUS | Status: DC
  Filled 2020-06-15: qty 1000

## 2020-06-15 MED ORDER — CHLORHEXIDINE GLUCONATE 0.12 % MT SOLN
15.0000 mL | Freq: Once | OROMUCOSAL | Status: DC
  Filled 2020-06-15: qty 15

## 2020-06-15 MED ORDER — ORAL CARE MOUTH RINSE
15.0000 mL | Freq: Once | OROMUCOSAL | Status: DC
  Filled 2020-06-15: qty 15

## 2020-06-15 NOTE — Progress Notes (Signed)
Anesthesia Chart Review   Case: [id number] Date/Time: 06/22/20 1130   Procedure: CT WITH ANESTHESIA  CRYOABLATION (N/A )   Anesthesia type: General   Pre-op diagnosis: LEFT RENAL MASS,THYROID CANCER   [facility]   Surgeons: Greggory Keen, MD      DISCUSSION:70 y.o. never smoker with h/o HTN, GERD, CAD (CABG 2019), CHF, DM II insulin dependent, CKD Stage III, left renal mass, thyroid cancer scheduled for above procedure 06/22/20 with Dr. Jiles Harold.   Pt last seen by cardiology 05/05/2020. Per OV note, "status post coronary artery bypass grafting, with known underlying depressed LV function preoperative echocardiogram showed 45 to 45% ejection fraction currently patient stable from a cardiac standpoint without recurrent symptoms or evidence of congestive heart failure."  S/p right quadriceps tendon repair 10/20/2019, no anesthesia complications noted.  VS: BP (!) 137/94   Pulse (!) 59   Temp 36.6 C (Oral)   Resp 20   Ht 5\' 7"  (1.702 m)   Wt 93.9 kg   SpO2 100%   BMI 32.42 kg/m   PROVIDERS: [name], MD is PCP   Lanelle Bal, MD is Cardiothoracic Surgeon LABS: Labs reviewed: Acceptable for surgery. (all labs ordered are listed, but only abnormal results are displayed)  Labs Reviewed  BASIC METABOLIC PANEL - Abnormal; Notable for the following components:      Result Value   Glucose, Bld 107 (*)    BUN 29 (*)    Creatinine, Ser 1.30 (*)    GFR, Estimated 59 (*)    All other components within normal limits  CBC - Abnormal; Notable for the following components:   Platelets 121 (*)    All other components within normal limits  GLUCOSE, CAPILLARY - Abnormal; Notable for the following components:   Glucose-Capillary 101 (*)    All other components within normal limits  HEMOGLOBIN A1C     IMAGES:   EKG:  EKG 01/28/2020: Sinus rhythm 62 bpm Old anterolateral infarct Poor R wave progression  CV: Echo 01/18/2020 Echocardiogram 01/18/2020:  Left  ventricle cavity is normal in size. Moderate concentric hypertrophy  of the left ventricle. Abnormal septal wall motion due to post-operative  coronary artery bypass graft. Limited visualization of endocardial  borders. LVEF probably 40-45%. Doppler evidence of grade I (impaired)  diastolic dysfunction, normal LAP.  Left atrial cavity is moderately dilated at 4.3 cm. The interatrial Septum  is thin and mobile but appears to be intact by 2D and CF Doppler  interrogation.  Trileaflet aortic valve with mild calcification. Trace aortic stenosis and  regurgitation.  Mild (Grade I) mitral regurgitation.  Mild tricuspid regurgitation. Estimated pulmonary artery systolic pressure  24 mmHg.  The aortic root is dilated at 4 cm.  No significant change compared to previous study on 10/27/2018.  Echo 04/01/2018  Left Atrium: Normal size, no evidence of LAA thrombus with PWD  velocities > 40 mm/s in LAA   Left Ventricle: Mildy dilated chamber size and reduced systolic  function, LVEF 82-50%, Hypokinesis of the anteroseptal wall. 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 No PFO or ASD on color flow  doppler.   Right ventricle: Normal wall thickness. Cavity is small. Mildly reduced  systolic function.   Pulmonic valve: Trace regurgitation.   Aortic Valve: Tricuspid valve noted, valve leaflets demonstrate normal  motion with minimal sclerosis or calcification.   Mitral Valve: Normal mitral annular size. Normal leaflet motion. Normal  mitral  inflow waveforms on CWD.   Aorta: Grade 1 calcification descending aorta and aortic arch, no  evidence of dissection. Minimal dilation of the ascending aorta noted.   Pericardium: No significant pericardial effusion.  Past Medical History:  Diagnosis Date  . Abnormal liver function test 05/23/2011  . ALLERGIC RHINITIS   . ANXIETY   . Aortic atherosclerosis ([place])   . Arthritis   . Ascending  aorta dilation ([facility]) 07/24/2018  . Bilateral renal cysts   . Cervical spondylolysis    Moderate  . CHF (congestive heart failure) ([place]) 07/24/2018  . Chronic kidney disease    CKD stage 3 per office visit note 08/08/17 on chart   . COLONIC POLYPS, HX OF   . Coronary artery disease   . DEPRESSION   . DIABETES MELLITUS, TYPE II   . Diverticulitis   . Epidermal cyst   . Fatty liver   . GERD   . GOUT   . History of inguinal hernia   . HOH (hard of hearing)    elft ear  . HYPERLIPIDEMIA   . HYPERTENSION   . IBS   . 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 Ischemic cardiomyopathy 07/24/2018  . Left renal mass 05/23/2011  . Lumbar radicular pain 06/03/2011  . Macular degeneration    right eye  . Multinodular goiter   . Myocardial infarction ([place])    silent MI in Pt's 8s  . Pancreatic pseudocyst    Stable  . Peripheral vascular disease ([place])   . Peritonsillar abscess   . Pulmonary nodule    Right upper lobe  . Radial neck fracture   . Right upper lobe pulmonary nodule 07/24/2018  . Thyroid disease   . Thyroid nodule    Bilateral  . Vertigo     Past Surgical History:  Procedure Laterality Date  . APPENDECTOMY  02/12/11  . COLON SURGERY  08/11/10   sigmoid colectomy  . COLON SURGERY  02/12/11   colostomy takedown  . COLONOSCOPY    . CORONARY ARTERY BYPASS GRAFT N/A 04/01/2018   Procedure: CORONARY ARTERY BYPASS GRAFTING (CABG) x Three, using left internal mammary artery and right leg greater saphenous vein harvested endoscopically;  Surgeon: Grace Isaac, MD;  Location: [facility];  Service: Open Heart Surgery;  Laterality: N/A;  . CYST REMOVAL TRUNK Right 02/20/2018   Procedure: epidermal cyst excision right lower back;  Surgeon: [name], MD;  [facility];  Service: General;  Laterality: Right;  . HERNIA REPAIR    . INSERTION OF MESH N/A 08/20/2012   Procedure: INSERTION OF MESH;  Surgeon:  [name], MD;  [facility];  Service: General;  Laterality: N/A;  . IR RADIOLOGIST EVAL & MGMT  03/23/2020  . KNEE ARTHROSCOPY Right 10/18/2019  . LEFT HEART CATH AND CORONARY ANGIOGRAPHY N/A 01/14/2018   Procedure: LEFT HEART CATH AND CORONARY ANGIOGRAPHY;  Surgeon: [name], MD;  Location: [facility];  Service: Cardiovascular;  Laterality: N/A;  . LYSIS OF ADHESION  08/20/2012   Procedure: LYSIS OF ADHESION;  Surgeon: [name], MD;  [facility];  Service: General;;  . QUADRICEPS TENDON REPAIR Right 10/20/2019   Procedure: REPAIR RIGHT QUADRICEP TENDON;  Surgeon: Paralee Cancel, MD;  Location: [place];  Service: Orthopedics;  Laterality: Right;  . SEPTOPLASTY  age 25  . TEE WITHOUT CARDIOVERSION N/A 04/01/2018   Procedure: TRANSESOPHAGEAL ECHOCARDIOGRAM (TEE);  Surgeon: Grace Isaac, MD;  Location: [facility];  Service: Open Heart Surgery;  Laterality: N/A;  . THYROIDECTOMY N/A 10/17/2017   Procedure: TOTAL THYROIDECTOMY;  Surgeon: [name], MD;  [facility];  Service: General;  Laterality: N/A;  . TONSILLECTOMY Right 08/01/2016   Procedure: INCISION AND DRAINAGE RIGHT PERI-TONSILLAR ABSCESS;  Surgeon: [name], MD;  [facility];  Service: ENT;  Laterality: Right;  . ULTRASOUND GUIDANCE FOR VASCULAR ACCESS  01/14/2018   Procedure: Ultrasound Guidance For Vascular Access;  Surgeon: [name], MD;  Location: [facility];  Service: Cardiovascular;;  . VENTRAL HERNIA REPAIR  08/20/2012   Procedure: HERNIA REPAIR VENTRAL ADULT;  Surgeon: [name], MD;  [facility];  Service: General;;    MEDICATIONS: . alendronate (FOSAMAX) 70 MG tablet  . allopurinol (ZYLOPRIM) 300 MG tablet  . ALPRAZolam (XANAX) 0.5 MG tablet  . aspirin EC 81 MG tablet  . atorvastatin (LIPITOR) 10 MG tablet  . calcium carbonate (TUMS) 500 MG chewable tablet  . carvedilol (COREG) 6.25 MG tablet  . Cholecalciferol (VITAMIN D-3) 25 MCG (1000 UT)  CAPS  . diphenhydrAMINE (BENADRYL) 25 MG tablet  . ENTRESTO 49-51 MG  . furosemide (LASIX) 40 MG tablet  . guaiFENesin (MUCINEX) 600 MG 12 hr tablet  . insulin glargine (LANTUS) 100 unit/mL SOPN  . levothyroxine (SYNTHROID) 175 MCG tablet  . Multiple Vitamins-Minerals (PRESERVISION AREDS 2 PO)  . omega-3 acid ethyl esters (LOVAZA) 1 g capsule  . pantoprazole (PROTONIX) 40 MG tablet  . rivaroxaban (XARELTO) 2.5 MG TABS tablet  . sildenafil (REVATIO) 20 MG tablet  . traMADol (ULTRAM) 50 MG tablet  . triamcinolone cream (KENALOG) 0.1 %  . TRULICITY [id number] [id number] SOPN   . chlorhexidine (PERIDEX) 0.12 % solution 15 mL   Or  . MEDLINE mouth rinse  . lactated ringers infusion   Konrad Felix, PA-C [facility] [phone]

## 2020-06-15 NOTE — Progress Notes (Signed)
COVID Vaccine Completed:Yes Date COVID Vaccine completed:07/17/19- Booster- 10?15/21 COVID vaccine manufacturer: Pfizer     PCP - Dr. Audie Pinto Cardiologist - Dr. Virgina Jock  Chest x-ray - 09/10/19-epic EKG - 01/28/20-epic Stress Test - no ECHO - 01/18/20-epic Cardiac Cath - 2019- CABG x3 2019-epic Pacemaker/ICD device last checked:NA  Sleep Study - no CPAP -   Fasting Blood Sugar - 67-140 Checks Blood Sugar _____ times a day. continuous  Blood Thinner Instructions:ASA and Xarelto/ Dr. Virgina Jock Aspirin Instructions:Stop 3 days prior to DOS / [facility](Pharm) Last Dose:06/20/20  Anesthesia review:   Patient denies shortness of breath, fever, cough and chest pain at PAT appointment  yes Patient verbalized understanding of instructions that were given to them at the PAT appointment. Patient was also instructed that they will need to review over the PAT instructions again at home before surgery. Yes . Pt can climb 1 flight of stairs, work around the house and ADLs with out SOB

## 2020-06-18 ENCOUNTER — Other Ambulatory Visit (HOSPITAL_COMMUNITY)
Admission: RE | Admit: 2020-06-18 | Discharge: 2020-06-18 | Disposition: A | Discharge status: Home / Self Care | Payer: Medicare PPO | Source: Ambulatory Visit | Attending: Interventional Radiology | Admitting: Interventional Radiology

## 2020-06-18 DIAGNOSIS — Z20822 Contact with and (suspected) exposure to covid-19: Secondary | ICD-10-CM | POA: Insufficient documentation

## 2020-06-18 DIAGNOSIS — Z01812 Encounter for preprocedural laboratory examination: Secondary | ICD-10-CM | POA: Insufficient documentation

## 2020-06-18 LAB — SARS CORONAVIRUS 2 (TAT 6-24 HRS): SARS Coronavirus 2: NEGATIVE

## 2020-06-21 ENCOUNTER — Other Ambulatory Visit: Payer: Self-pay | Admitting: Radiology

## 2020-06-22 ENCOUNTER — Ambulatory Visit (HOSPITAL_COMMUNITY): Payer: Medicare PPO

## 2020-06-22 ENCOUNTER — Ambulatory Visit (HOSPITAL_COMMUNITY)
Admission: RE | Admit: 2020-06-22 | Discharge: 2020-06-22 | Disposition: A | Discharge status: Home / Self Care | Payer: Medicare PPO | Attending: Interventional Radiology | Admitting: Interventional Radiology

## 2020-06-22 ENCOUNTER — Ambulatory Visit (HOSPITAL_COMMUNITY): Payer: Medicare PPO | Admitting: Physician Assistant

## 2020-06-22 ENCOUNTER — Ambulatory Visit (HOSPITAL_COMMUNITY): Payer: Medicare PPO | Admitting: Anesthesiology

## 2020-06-22 ENCOUNTER — Encounter (HOSPITAL_COMMUNITY)
Admission: RE | Disposition: A | Discharge status: Home / Self Care | Payer: Self-pay | Source: Home / Self Care | Attending: Interventional Radiology

## 2020-06-22 ENCOUNTER — Encounter (HOSPITAL_COMMUNITY): Payer: Self-pay

## 2020-06-22 ENCOUNTER — Ambulatory Visit (HOSPITAL_COMMUNITY)
Admission: RE | Admit: 2020-06-22 | Discharge: 2020-06-22 | Disposition: A | Discharge status: Home / Self Care | Payer: Medicare PPO | Source: Ambulatory Visit | Attending: Interventional Radiology | Admitting: Interventional Radiology

## 2020-06-22 ENCOUNTER — Other Ambulatory Visit: Payer: Self-pay

## 2020-06-22 DIAGNOSIS — N2889 Other specified disorders of kidney and ureter: Secondary | ICD-10-CM

## 2020-06-22 DIAGNOSIS — Z5309 Procedure and treatment not carried out because of other contraindication: Secondary | ICD-10-CM | POA: Diagnosis not present

## 2020-06-22 DIAGNOSIS — Z01818 Encounter for other preprocedural examination: Secondary | ICD-10-CM

## 2020-06-22 DIAGNOSIS — E119 Type 2 diabetes mellitus without complications: Secondary | ICD-10-CM | POA: Diagnosis not present

## 2020-06-22 HISTORY — PX: RADIOLOGY WITH ANESTHESIA: SHX6223

## 2020-06-22 LAB — CBC WITH DIFFERENTIAL/PLATELET
Abs Immature Granulocytes: 0.03 10*3/uL (ref 0.00–0.07)
Basophils Absolute: 0 10*3/uL (ref 0.0–0.1)
Basophils Relative: 0 %
Eosinophils Absolute: 0.4 10*3/uL (ref 0.0–0.5)
Eosinophils Relative: 5 %
HCT: 48.4 % (ref 39.0–52.0)
Hemoglobin: 16.4 g/dL (ref 13.0–17.0)
Immature Granulocytes: 0 %
Lymphocytes Relative: 23 %
Lymphs Abs: 1.7 10*3/uL (ref 0.7–4.0)
MCH: 32.2 pg (ref 26.0–34.0)
MCHC: 33.9 g/dL (ref 30.0–36.0)
MCV: 95.1 fL (ref 80.0–100.0)
Monocytes Absolute: 0.5 10*3/uL (ref 0.1–1.0)
Monocytes Relative: 7 %
Neutro Abs: 4.6 10*3/uL (ref 1.7–7.7)
Neutrophils Relative %: 65 %
Platelets: 121 10*3/uL — ABNORMAL LOW (ref 150–400)
RBC: 5.09 MIL/uL (ref 4.22–5.81)
RDW: 13.3 % (ref 11.5–15.5)
WBC: 7.2 10*3/uL (ref 4.0–10.5)
nRBC: 0 % (ref 0.0–0.2)

## 2020-06-22 LAB — BASIC METABOLIC PANEL
Anion gap: 9 (ref 5–15)
BUN: 27 mg/dL — ABNORMAL HIGH (ref 8–23)
CO2: 23 mmol/L (ref 22–32)
Calcium: 9.4 mg/dL (ref 8.9–10.3)
Chloride: 108 mmol/L (ref 98–111)
Creatinine, Ser: 1.61 mg/dL — ABNORMAL HIGH (ref 0.61–1.24)
GFR, Estimated: 46 mL/min — ABNORMAL LOW (ref >60)
Glucose, Bld: 136 mg/dL — ABNORMAL HIGH (ref 70–99)
Potassium: 4.6 mmol/L (ref 3.5–5.1)
Sodium: 140 mmol/L (ref 135–145)

## 2020-06-22 LAB — GLUCOSE, CAPILLARY: Glucose-Capillary: 136 mg/dL — ABNORMAL HIGH (ref 70–99)

## 2020-06-22 LAB — PROTIME-INR
INR: 1.1 (ref 0.8–1.2)
Prothrombin Time: 13.9 seconds (ref 11.4–15.2)

## 2020-06-22 SURGERY — CT WITH ANESTHESIA
Anesthesia: General

## 2020-06-22 MED ORDER — CEFAZOLIN SODIUM-DEXTROSE 2-4 GM/100ML-% IV SOLN
2.0000 g | INTRAVENOUS | Status: DC
  Filled 2020-06-22: qty 100

## 2020-06-22 MED ORDER — FENTANYL CITRATE (PF) 250 MCG/5ML IJ SOLN
INTRAMUSCULAR | Status: AC
  Filled 2020-06-22: qty 5

## 2020-06-22 MED ORDER — ACETAMINOPHEN 500 MG PO TABS
1000.0000 mg | ORAL_TABLET | Freq: Once | ORAL | Status: AC
  Administered 2020-06-22: 1000 mg via ORAL
  Filled 2020-06-22: qty 2

## 2020-06-22 MED ORDER — MIDAZOLAM HCL 2 MG/2ML IJ SOLN
INTRAMUSCULAR | Status: AC
  Filled 2020-06-22: qty 2

## 2020-06-22 MED ORDER — FENTANYL CITRATE (PF) 100 MCG/2ML IJ SOLN
INTRAMUSCULAR | Status: AC
  Filled 2020-06-22: qty 2

## 2020-06-22 MED ORDER — LACTATED RINGERS IV SOLN: INTRAVENOUS | Status: DC

## 2020-06-22 NOTE — Anesthesia Preprocedure Evaluation (Addendum)
Anesthesia Evaluation  Patient identified by MRN, date of birth, ID band Patient awake    Reviewed: Allergy & Precautions, NPO status , Patient's Chart, lab work & pertinent test results  History of Anesthesia Complications Negative for: history of anesthetic complications  Airway Mallampati: II  TM Distance: >3 FB Neck ROM: Full    Dental no notable dental hx. (+) Dental Advisory Given   Pulmonary shortness of breath, neg sleep apnea, neg recent URI,    Pulmonary exam normal        Cardiovascular hypertension, Pt. on home beta blockers and Pt. on medications + CAD, + CABG and +CHF  Normal cardiovascular exam   Left Atrium: Normal size, no evidence of LAA thrombus with PWD  velocities > 40 mm/s in LAA   Left Ventricle: Mildy dilated chamber size and reduced systolic  function, LVEF 72-53%, Hypokinesis of the anteroseptal wall. 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 No PFO or ASD on color flow  doppler.   Right ventricle: Normal wall thickness. Cavity is small. Mildly reduced  systolic function.   Pulmonic valve: Trace regurgitation.   Aortic Valve: Tricuspid valve noted, valve leaflets demonstrate normal  motion with minimal sclerosis or calcification.   Mitral Valve: Normal mitral annular size. Normal leaflet motion. Normal  mitral inflow waveforms on CWD.   Aorta: Grade 1 calcification descending aorta and aortic arch, no  evidence of dissection. Minimal dilation of the ascending aorta noted.   Pericardium: No significant pericardial effusion.    Neuro/Psych PSYCHIATRIC DISORDERS Anxiety Depression  Neuromuscular disease    GI/Hepatic Neg liver ROS, GERD  Controlled and Medicated,  Endo/Other  diabetes, Insulin DependentHypothyroidism   Renal/GU CRFRenal disease            Musculoskeletal  (+) Arthritis ,   Abdominal   Peds  Hematology Lab  Results      Component                Value               Date                      WBC                      8.5                 10/19/2019                HGB                      14.9                10/19/2019                HCT                      44.1                10/19/2019                MCV                      94.6                10/19/2019  PLT                                          10/19/2019            PLATELET CLUMPS NOTED ON SMEAR, UNABLE TO ESTIMATE >   Anesthesia Other Findings   Reproductive/Obstetrics                            Anesthesia Physical  Anesthesia Plan  ASA: III  Anesthesia Plan: General   Post-op Pain Management:    Induction: Intravenous  PONV Risk Score and Plan: 2 and Ondansetron and Dexamethasone  Airway Management Planned: Oral ETT  Additional Equipment: None  Intra-op Plan:   Post-operative Plan: Extubation in OR  Informed Consent: I have reviewed the patients History and Physical, chart, labs and discussed the procedure including the risks, benefits and alternatives for the proposed anesthesia with the patient or authorized representative who has indicated his/her understanding and acceptance.     Dental advisory given  Plan Discussed with: Anesthesiologist and CRNA  Anesthesia Plan Comments: (Pt took xarelto by mistake so patient was cancelled by Dr. Reesa Chew)      Anesthesia Quick Evaluation

## 2020-06-22 NOTE — Progress Notes (Signed)
Patient ID: Nathan Medina, male   DOB: Apr 24, 1951, 70 y.o.   MRN: [id number] Patient presented to [facility] department today for elective CT-guided left renal mass biopsy and cryoablation.  Upon questioning pt he stated that he took his 2.5 mg dose of Xarelto by mistake last night, therefore procedure will be canceled for today and rescheduled for later date.  Dr. Annamaria Boots updated and spoke with patient.

## 2020-06-24 ENCOUNTER — Encounter (HOSPITAL_COMMUNITY): Payer: Self-pay | Admitting: Interventional Radiology

## 2020-06-28 NOTE — Progress Notes (Signed)
Mr. Hatlestad made aware to arrive at 10 Am 07/06/20, reviewed instructions, verbalized understanding.

## 2020-07-04 ENCOUNTER — Other Ambulatory Visit (HOSPITAL_COMMUNITY)
Admission: RE | Admit: 2020-07-04 | Discharge: 2020-07-04 | Disposition: A | Discharge status: Home / Self Care | Payer: Medicare PPO | Source: Ambulatory Visit | Attending: Interventional Radiology | Admitting: Interventional Radiology

## 2020-07-04 DIAGNOSIS — Z01812 Encounter for preprocedural laboratory examination: Secondary | ICD-10-CM | POA: Insufficient documentation

## 2020-07-04 DIAGNOSIS — Z20822 Contact with and (suspected) exposure to covid-19: Secondary | ICD-10-CM | POA: Diagnosis not present

## 2020-07-04 LAB — SARS CORONAVIRUS 2 (TAT 6-24 HRS): SARS Coronavirus 2: NEGATIVE

## 2020-07-05 ENCOUNTER — Other Ambulatory Visit: Payer: Self-pay | Admitting: Radiology

## 2020-07-05 ENCOUNTER — Other Ambulatory Visit: Payer: Self-pay | Admitting: Student

## 2020-07-06 ENCOUNTER — Encounter (HOSPITAL_COMMUNITY): Payer: Self-pay | Admitting: Interventional Radiology

## 2020-07-06 ENCOUNTER — Ambulatory Visit (HOSPITAL_COMMUNITY): Payer: Medicare PPO | Admitting: Anesthesiology

## 2020-07-06 ENCOUNTER — Observation Stay (HOSPITAL_COMMUNITY)
Admission: RE | Admit: 2020-07-06 | Discharge: 2020-07-07 | Disposition: A | Discharge status: Home / Self Care | Payer: Medicare PPO | Attending: Interventional Radiology | Admitting: Interventional Radiology

## 2020-07-06 ENCOUNTER — Encounter (HOSPITAL_COMMUNITY): Payer: Self-pay

## 2020-07-06 ENCOUNTER — Other Ambulatory Visit: Payer: Self-pay

## 2020-07-06 ENCOUNTER — Observation Stay (HOSPITAL_COMMUNITY)
Admission: RE | Admit: 2020-07-06 | Discharge: 2020-07-06 | Disposition: A | Discharge status: Home / Self Care | Payer: Medicare PPO | Source: Home / Self Care | Attending: Interventional Radiology | Admitting: Interventional Radiology

## 2020-07-06 ENCOUNTER — Encounter (HOSPITAL_COMMUNITY)
Admission: RE | Disposition: A | Discharge status: Home / Self Care | Payer: Self-pay | Source: Home / Self Care | Attending: Interventional Radiology

## 2020-07-06 DIAGNOSIS — E1122 Type 2 diabetes mellitus with diabetic chronic kidney disease: Secondary | ICD-10-CM | POA: Insufficient documentation

## 2020-07-06 DIAGNOSIS — Z7901 Long term (current) use of anticoagulants: Secondary | ICD-10-CM | POA: Diagnosis not present

## 2020-07-06 DIAGNOSIS — Z888 Allergy status to other drugs, medicaments and biological substances status: Secondary | ICD-10-CM | POA: Insufficient documentation

## 2020-07-06 DIAGNOSIS — C642 Malignant neoplasm of left kidney, except renal pelvis: Principal | ICD-10-CM | POA: Insufficient documentation

## 2020-07-06 DIAGNOSIS — I252 Old myocardial infarction: Secondary | ICD-10-CM | POA: Diagnosis not present

## 2020-07-06 DIAGNOSIS — Z79899 Other long term (current) drug therapy: Secondary | ICD-10-CM | POA: Diagnosis not present

## 2020-07-06 DIAGNOSIS — Z7982 Long term (current) use of aspirin: Secondary | ICD-10-CM | POA: Diagnosis not present

## 2020-07-06 DIAGNOSIS — Z794 Long term (current) use of insulin: Secondary | ICD-10-CM | POA: Insufficient documentation

## 2020-07-06 DIAGNOSIS — N2889 Other specified disorders of kidney and ureter: Secondary | ICD-10-CM | POA: Diagnosis not present

## 2020-07-06 DIAGNOSIS — E785 Hyperlipidemia, unspecified: Secondary | ICD-10-CM | POA: Insufficient documentation

## 2020-07-06 DIAGNOSIS — N183 Chronic kidney disease, stage 3 unspecified: Secondary | ICD-10-CM | POA: Insufficient documentation

## 2020-07-06 DIAGNOSIS — I130 Hypertensive heart and chronic kidney disease with heart failure and stage 1 through stage 4 chronic kidney disease, or unspecified chronic kidney disease: Secondary | ICD-10-CM | POA: Diagnosis not present

## 2020-07-06 DIAGNOSIS — Z79890 Hormone replacement therapy (postmenopausal): Secondary | ICD-10-CM | POA: Insufficient documentation

## 2020-07-06 DIAGNOSIS — I2510 Atherosclerotic heart disease of native coronary artery without angina pectoris: Secondary | ICD-10-CM | POA: Insufficient documentation

## 2020-07-06 DIAGNOSIS — K760 Fatty (change of) liver, not elsewhere classified: Secondary | ICD-10-CM | POA: Diagnosis not present

## 2020-07-06 DIAGNOSIS — H9190 Unspecified hearing loss, unspecified ear: Secondary | ICD-10-CM | POA: Diagnosis not present

## 2020-07-06 DIAGNOSIS — I509 Heart failure, unspecified: Secondary | ICD-10-CM | POA: Diagnosis not present

## 2020-07-06 DIAGNOSIS — E119 Type 2 diabetes mellitus without complications: Secondary | ICD-10-CM | POA: Diagnosis not present

## 2020-07-06 DIAGNOSIS — Z882 Allergy status to sulfonamides status: Secondary | ICD-10-CM | POA: Diagnosis not present

## 2020-07-06 DIAGNOSIS — E1151 Type 2 diabetes mellitus with diabetic peripheral angiopathy without gangrene: Secondary | ICD-10-CM | POA: Insufficient documentation

## 2020-07-06 DIAGNOSIS — I110 Hypertensive heart disease with heart failure: Secondary | ICD-10-CM | POA: Diagnosis not present

## 2020-07-06 DIAGNOSIS — Z951 Presence of aortocoronary bypass graft: Secondary | ICD-10-CM | POA: Insufficient documentation

## 2020-07-06 DIAGNOSIS — E890 Postprocedural hypothyroidism: Secondary | ICD-10-CM | POA: Insufficient documentation

## 2020-07-06 DIAGNOSIS — C649 Malignant neoplasm of unspecified kidney, except renal pelvis: Secondary | ICD-10-CM | POA: Diagnosis present

## 2020-07-06 DIAGNOSIS — Z881 Allergy status to other antibiotic agents status: Secondary | ICD-10-CM | POA: Diagnosis not present

## 2020-07-06 DIAGNOSIS — Z7983 Long term (current) use of bisphosphonates: Secondary | ICD-10-CM | POA: Insufficient documentation

## 2020-07-06 DIAGNOSIS — I5022 Chronic systolic (congestive) heart failure: Secondary | ICD-10-CM | POA: Diagnosis not present

## 2020-07-06 HISTORY — PX: RADIOLOGY WITH ANESTHESIA: SHX6223

## 2020-07-06 LAB — CBC WITH DIFFERENTIAL/PLATELET
Abs Immature Granulocytes: 0.02 10*3/uL (ref 0.00–0.07)
Basophils Absolute: 0.1 10*3/uL (ref 0.0–0.1)
Basophils Relative: 1 %
Eosinophils Absolute: 0.3 10*3/uL (ref 0.0–0.5)
Eosinophils Relative: 5 %
HCT: 49.1 % (ref 39.0–52.0)
Hemoglobin: 16.3 g/dL (ref 13.0–17.0)
Immature Granulocytes: 0 %
Lymphocytes Relative: 36 %
Lymphs Abs: 2.1 10*3/uL (ref 0.7–4.0)
MCH: 32.1 pg (ref 26.0–34.0)
MCHC: 33.2 g/dL (ref 30.0–36.0)
MCV: 96.8 fL (ref 80.0–100.0)
Monocytes Absolute: 0.5 10*3/uL (ref 0.1–1.0)
Monocytes Relative: 9 %
Neutro Abs: 2.9 10*3/uL (ref 1.7–7.7)
Neutrophils Relative %: 49 %
Platelets: 132 10*3/uL — ABNORMAL LOW (ref 150–400)
RBC: 5.07 MIL/uL (ref 4.22–5.81)
RDW: 13.1 % (ref 11.5–15.5)
WBC: 6 10*3/uL (ref 4.0–10.5)
nRBC: 0 % (ref 0.0–0.2)

## 2020-07-06 LAB — BASIC METABOLIC PANEL
Anion gap: 11 (ref 5–15)
BUN: 29 mg/dL — ABNORMAL HIGH (ref 8–23)
CO2: 26 mmol/L (ref 22–32)
Calcium: 9.1 mg/dL (ref 8.9–10.3)
Chloride: 106 mmol/L (ref 98–111)
Creatinine, Ser: 1.51 mg/dL — ABNORMAL HIGH (ref 0.61–1.24)
GFR, Estimated: 49 mL/min — ABNORMAL LOW (ref >60)
Glucose, Bld: 117 mg/dL — ABNORMAL HIGH (ref 70–99)
Potassium: 3.8 mmol/L (ref 3.5–5.1)
Sodium: 143 mmol/L (ref 135–145)

## 2020-07-06 LAB — GLUCOSE, CAPILLARY
Glucose-Capillary: 129 mg/dL — ABNORMAL HIGH (ref 70–99)
Glucose-Capillary: 117 mg/dL — ABNORMAL HIGH (ref 70–99)
Glucose-Capillary: 240 mg/dL — ABNORMAL HIGH (ref 70–99)

## 2020-07-06 SURGERY — CT WITH ANESTHESIA
Anesthesia: General

## 2020-07-06 MED ORDER — PANTOPRAZOLE SODIUM 40 MG PO TBEC: 40.0000 mg | DELAYED_RELEASE_TABLET | ORAL | Status: DC

## 2020-07-06 MED ORDER — PROPOFOL 10 MG/ML IV BOLUS
INTRAVENOUS | Status: DC | PRN
  Administered 2020-07-06: 150 mg via INTRAVENOUS

## 2020-07-06 MED ORDER — LIDOCAINE 2% (20 MG/ML) 5 ML SYRINGE
INTRAMUSCULAR | Status: DC | PRN
  Administered 2020-07-06: 60 mg via INTRAVENOUS

## 2020-07-06 MED ORDER — ALLOPURINOL 300 MG PO TABS
300.0000 mg | ORAL_TABLET | Freq: Every day | ORAL | Status: DC
  Administered 2020-07-07: 300 mg via ORAL
  Filled 2020-07-06: qty 1

## 2020-07-06 MED ORDER — SODIUM CHLORIDE 0.9 % IV SOLN
INTRAVENOUS | Status: AC
  Filled 2020-07-06: qty 250

## 2020-07-06 MED ORDER — ROCURONIUM BROMIDE 10 MG/ML (PF) SYRINGE
PREFILLED_SYRINGE | INTRAVENOUS | Status: DC | PRN
  Administered 2020-07-06: 100 mg via INTRAVENOUS

## 2020-07-06 MED ORDER — DEXAMETHASONE SODIUM PHOSPHATE 10 MG/ML IJ SOLN
INTRAMUSCULAR | Status: DC | PRN
  Administered 2020-07-06: 10 mg via INTRAVENOUS

## 2020-07-06 MED ORDER — LEVOTHYROXINE SODIUM 25 MCG PO TABS
175.0000 ug | ORAL_TABLET | Freq: Every day | ORAL | Status: DC
  Administered 2020-07-07: 175 ug via ORAL
  Filled 2020-07-06 (×2): qty 1

## 2020-07-06 MED ORDER — LACTATED RINGERS IV SOLN: INTRAVENOUS | Status: DC

## 2020-07-06 MED ORDER — FENTANYL CITRATE (PF) 100 MCG/2ML IJ SOLN
25.0000 ug | INTRAMUSCULAR | Status: DC | PRN
  Administered 2020-07-06: 50 ug via INTRAVENOUS

## 2020-07-06 MED ORDER — PHENYLEPHRINE HCL (PRESSORS) 10 MG/ML IV SOLN
INTRAVENOUS | Status: DC | PRN
  Administered 2020-07-06: 120 ug via INTRAVENOUS

## 2020-07-06 MED ORDER — CHLORHEXIDINE GLUCONATE 0.12 % MT SOLN
15.0000 mL | Freq: Once | OROMUCOSAL | Status: AC
  Administered 2020-07-06: 15 mL via OROMUCOSAL

## 2020-07-06 MED ORDER — SACUBITRIL-VALSARTAN 49-51 MG PO TABS
1.0000 | ORAL_TABLET | Freq: Two times a day (BID) | ORAL | Status: DC
  Administered 2020-07-06 – 2020-07-07 (×2): 1 via ORAL
  Filled 2020-07-06 (×2): qty 1

## 2020-07-06 MED ORDER — INSULIN GLARGINE 100 UNITS/ML SOLOSTAR PEN: 44.0000 [IU] | PEN_INJECTOR | Freq: Every day | SUBCUTANEOUS | Status: DC

## 2020-07-06 MED ORDER — SUGAMMADEX SODIUM 200 MG/2ML IV SOLN
INTRAVENOUS | Status: DC | PRN
  Administered 2020-07-06: 200 mg via INTRAVENOUS

## 2020-07-06 MED ORDER — FENTANYL CITRATE (PF) 100 MCG/2ML IJ SOLN
INTRAMUSCULAR | Status: AC
  Filled 2020-07-06: qty 2

## 2020-07-06 MED ORDER — SODIUM CHLORIDE 0.9 % IV SOLN
INTRAVENOUS | Status: AC
  Filled 2020-07-06: qty 500

## 2020-07-06 MED ORDER — INSULIN GLARGINE 100 UNIT/ML ~~LOC~~ SOLN
44.0000 [IU] | Freq: Every day | SUBCUTANEOUS | Status: DC
  Administered 2020-07-06: 44 [IU] via SUBCUTANEOUS
  Filled 2020-07-06: qty 0.44

## 2020-07-06 MED ORDER — CALCIUM CARBONATE ANTACID 500 MG PO CHEW: 2.0000 | CHEWABLE_TABLET | Freq: Two times a day (BID) | ORAL | Status: DC | PRN

## 2020-07-06 MED ORDER — ATORVASTATIN CALCIUM 10 MG PO TABS
10.0000 mg | ORAL_TABLET | Freq: Every day | ORAL | Status: DC
  Administered 2020-07-06: 10 mg via ORAL
  Filled 2020-07-06: qty 1

## 2020-07-06 MED ORDER — OMEGA-3-ACID ETHYL ESTERS 1 G PO CAPS
2.0000 g | ORAL_CAPSULE | Freq: Two times a day (BID) | ORAL | Status: DC
  Administered 2020-07-06 – 2020-07-07 (×2): 2 g via ORAL
  Filled 2020-07-06 (×3): qty 2

## 2020-07-06 MED ORDER — ALPRAZOLAM 0.25 MG PO TABS: 0.2500 mg | ORAL_TABLET | Freq: Every evening | ORAL | Status: DC | PRN

## 2020-07-06 MED ORDER — CEFAZOLIN SODIUM-DEXTROSE 2-4 GM/100ML-% IV SOLN
2.0000 g | INTRAVENOUS | Status: AC
  Administered 2020-07-06: 2 g via INTRAVENOUS

## 2020-07-06 MED ORDER — ONDANSETRON HCL 4 MG/2ML IJ SOLN: 4.0000 mg | Freq: Once | INTRAMUSCULAR | Status: DC | PRN

## 2020-07-06 MED ORDER — CHLORHEXIDINE GLUCONATE CLOTH 2 % EX PADS
6.0000 | MEDICATED_PAD | Freq: Every day | CUTANEOUS | Status: DC
  Administered 2020-07-06: 6 via TOPICAL

## 2020-07-06 MED ORDER — PANTOPRAZOLE SODIUM 40 MG PO TBEC
40.0000 mg | DELAYED_RELEASE_TABLET | Freq: Every day | ORAL | Status: DC
  Administered 2020-07-07: 40 mg via ORAL
  Filled 2020-07-06: qty 1

## 2020-07-06 MED ORDER — CARVEDILOL 6.25 MG PO TABS
6.2500 mg | ORAL_TABLET | Freq: Two times a day (BID) | ORAL | Status: DC
  Administered 2020-07-06 – 2020-07-07 (×2): 6.25 mg via ORAL
  Filled 2020-07-06 (×2): qty 1

## 2020-07-06 MED ORDER — ORAL CARE MOUTH RINSE: 15.0000 mL | Freq: Once | OROMUCOSAL | Status: AC

## 2020-07-06 MED ORDER — CEFAZOLIN SODIUM-DEXTROSE 2-4 GM/100ML-% IV SOLN
INTRAVENOUS | Status: AC
  Filled 2020-07-06: qty 100

## 2020-07-06 MED ORDER — FUROSEMIDE 40 MG PO TABS
40.0000 mg | ORAL_TABLET | Freq: Every day | ORAL | Status: DC
  Administered 2020-07-06 – 2020-07-07 (×2): 40 mg via ORAL
  Filled 2020-07-06 (×2): qty 1

## 2020-07-06 MED ORDER — EPHEDRINE SULFATE 50 MG/ML IJ SOLN
INTRAMUSCULAR | Status: DC | PRN
  Administered 2020-07-06: 5 mg via INTRAVENOUS

## 2020-07-06 MED ORDER — ONDANSETRON HCL 4 MG/2ML IJ SOLN
INTRAMUSCULAR | Status: DC | PRN
  Administered 2020-07-06: 4 mg via INTRAVENOUS

## 2020-07-06 MED ORDER — PHENYLEPHRINE HCL-NACL 10-0.9 MG/250ML-% IV SOLN
INTRAVENOUS | Status: DC | PRN
  Administered 2020-07-06: 40 ug/min via INTRAVENOUS

## 2020-07-06 MED ORDER — FENTANYL CITRATE (PF) 250 MCG/5ML IJ SOLN
INTRAMUSCULAR | Status: AC
  Filled 2020-07-06: qty 5

## 2020-07-06 MED ORDER — FENTANYL CITRATE (PF) 250 MCG/5ML IJ SOLN
INTRAMUSCULAR | Status: DC | PRN
  Administered 2020-07-06: 100 ug via INTRAVENOUS

## 2020-07-06 NOTE — H&P (Signed)
Chief Complaint: Patient was seen in consultation today for   Referring Physician(s): Dr. Mena Goes  Supervising Physician: Ruel Favors  Patient Status: [facility] - Out-pt  History of Present Illness: Nathan Medina is a 70 y.o. male with a medical history significant for HTN, CKD, DM, ischemic cardiomyopathy (bypass 2019), and prior thyroidectomy. He was referred to IR for a slowly enlarging left renal mid-pole exophytic lesion which was first identified on imaging in 2016. 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 He met with Dr. Miles Costain 03/23/20 to discuss possible treatment options and after a thorough discussion of the risks and benefits the patient was in agreement to proceed with an image-guided left renal biopsy and cryoablation. The patient presented to IR 06/22/20 for this procedure but did not hold his Xarelto and the procedure was rescheduled for today.    Past Medical History:  Diagnosis Date  . Abnormal liver function test 05/23/2011  . ALLERGIC RHINITIS   . ANXIETY   . Aortic atherosclerosis (HCC)   . Arthritis   . Ascending aorta dilation (HCC) 07/24/2018  . Bilateral renal cysts   . Cervical spondylolysis    Moderate  . CHF (congestive heart failure) (HCC) 07/24/2018  . Chronic kidney disease    CKD stage 3 per office visit note 08/08/17 on chart   . COLONIC POLYPS, HX OF   . Coronary artery disease   . DEPRESSION   . DIABETES MELLITUS, TYPE II   . Diverticulitis   . Epidermal cyst   . Fatty liver   . GERD   . GOUT   . History of inguinal hernia   . HOH (hard of hearing)    elft ear  . HYPERLIPIDEMIA   . HYPERTENSION   . IBS   . 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 Ischemic cardiomyopathy 07/24/2018  . Left renal mass 05/23/2011  . Lumbar  radicular pain 06/03/2011  . Macular degeneration    right eye  . Multinodular goiter   . Myocardial infarction (HCC)    silent MI in Pt's 30s  . Pancreatic pseudocyst    Stable  . Peripheral vascular disease (HCC)   . Peritonsillar abscess   . Pulmonary nodule    Right upper lobe  . Radial neck fracture   . Right upper lobe pulmonary nodule 07/24/2018  . Thyroid disease   . Thyroid nodule    Bilateral  . Vertigo     Past Surgical History:  Procedure Laterality Date  . APPENDECTOMY  02/12/11  . COLON SURGERY  08/11/10   sigmoid colectomy  . COLON SURGERY  02/12/11   colostomy takedown  . COLONOSCOPY    . CORONARY ARTERY BYPASS GRAFT N/A 04/01/2018   Procedure: CORONARY ARTERY BYPASS GRAFTING (CABG) x Three, using left internal mammary artery and right leg greater saphenous vein harvested endoscopically;  Surgeon: Delight Ovens, MD;  Location: [facility] OR;  Service: Open Heart Surgery;  Laterality: N/A;  . CYST REMOVAL TRUNK Right 02/20/2018   Procedure: epidermal cyst excision right lower back;  Surgeon: Darnell Level, MD;  Location: [facility]S;  Service: General;  Laterality: Right;  . HERNIA REPAIR    . INSERTION OF MESH N/A 08/20/2012   Procedure: INSERTION OF MESH;  Surgeon: Robyne Askew, MD;  Location: [facility];  Service: General;  Laterality: N/A;  .  IR RADIOLOGIST EVAL & MGMT  03/23/2020  . KNEE ARTHROSCOPY Right 10/18/2019  . LEFT HEART CATH AND CORONARY ANGIOGRAPHY N/A 01/14/2018   Procedure: LEFT HEART CATH AND CORONARY ANGIOGRAPHY;  Surgeon: [name], MD;  Location: [place] [facility];  Service: Cardiovascular;  Laterality: N/A;  . LYSIS OF ADHESION  08/20/2012   Procedure: LYSIS OF ADHESION;  Surgeon: [name], MD;  Location: [facility];  Service: General;;  . QUADRICEPS TENDON REPAIR Right 10/20/2019   Procedure: REPAIR RIGHT QUADRICEP TENDON;  Surgeon: Paralee Cancel, MD;  Location: [place];  Service: Orthopedics;  Laterality: Right;  . RADIOLOGY WITH ANESTHESIA N/A  06/22/2020   Procedure: CT WITH ANESTHESIA  CRYOABLATION;  Surgeon: Greggory Keen, MD;  Location: [facility];  Service: Anesthesiology;  Laterality: N/A;  . SEPTOPLASTY  age 73  . TEE WITHOUT CARDIOVERSION N/A 04/01/2018   Procedure: TRANSESOPHAGEAL ECHOCARDIOGRAM (TEE);  Surgeon: Grace Isaac, MD;  Location: [place];  Service: Open Heart Surgery;  Laterality: N/A;  . THYROIDECTOMY N/A 10/17/2017   Procedure: TOTAL THYROIDECTOMY;  Surgeon: [name], MD;  Location: [facility];  Service: General;  Laterality: N/A;  . TONSILLECTOMY Right 08/01/2016   Procedure: INCISION AND DRAINAGE RIGHT PERI-TONSILLAR ABSCESS;  Surgeon: [name], MD;  Location: [facility];  Service: ENT;  Laterality: Right;  . ULTRASOUND GUIDANCE FOR VASCULAR ACCESS  01/14/2018   Procedure: Ultrasound Guidance For Vascular Access;  Surgeon: [name], MD;  Location: [place] [facility];  Service: Cardiovascular;;  . VENTRAL HERNIA REPAIR  08/20/2012   Procedure: HERNIA REPAIR VENTRAL ADULT;  Surgeon: [name], MD;  Location: [facility];  Service: General;;    Allergies: Ciprofloxacin, Escitalopram oxalate, Quinapril hcl, and Sulfa antibiotics  Medications: Prior to Admission medications   Medication Sig Start Date End Date Taking? Authorizing Provider  alendronate (FOSAMAX) 70 MG tablet Take 70 mg by mouth once a week. Take with a full glass of water on an empty stomach.    [provider]  allopurinol (ZYLOPRIM) 300 MG tablet TAKE 1 TABLET BY MOUTH EVERY DAY Patient taking differently: Take 300 mg by mouth daily. 05/16/13   Biagio Borg, MD  ALPRAZolam Duanne Moron) 0.5 MG tablet Take 1 tablet (0.5 mg total) by mouth daily as needed for sleep. 1/2 - 1 by mouth once daily as needed Patient taking differently: Take 0.25-0.5 mg by mouth at bedtime as needed for sleep. 04/03/12   Biagio Borg, MD  aspirin EC 81 MG tablet Take 81 mg by mouth every other day.     [provider]  atorvastatin (LIPITOR) 10  MG tablet Take 10 mg by mouth at bedtime.    [provider]  calcium carbonate (TUMS) 500 MG chewable tablet Chew 2 tablets (400 mg of elemental calcium total) by mouth 2 (two) times daily. Patient taking differently: Chew 2 tablets by mouth 2 (two) times daily as needed for indigestion. 10/18/17   [name], MD  carvedilol (COREG) 6.25 MG tablet Take 1 tablet (6.25 mg total) by mouth 2 (two) times daily. 05/05/19   Patwardhan, Reynold Bowen, MD  Cholecalciferol (VITAMIN D-3) 25 MCG (1000 UT) CAPS Take 1,000 Units by mouth at bedtime.    [provider]  diphenhydrAMINE (BENADRYL) 25 MG tablet Take 25 mg by mouth at bedtime as needed for sleep.    [provider]  ENTRESTO 49-51 MG Take 1 tablet by mouth twice daily Patient taking differently: Take 1 tablet by  mouth 2 (two) times daily. 06/08/20   Patwardhan, Reynold Bowen, MD  furosemide (LASIX) 40 MG tablet Take 40 mg by mouth daily.    [provider]  guaiFENesin (MUCINEX) 600 MG 12 hr tablet Take 1,200 mg by mouth 2 (two) times daily as needed for cough or to loosen phlegm.    [provider]  insulin glargine (LANTUS) 100 unit/mL SOPN Inject 44 Units into the skin at bedtime.    [provider]  levothyroxine (SYNTHROID) 175 MCG tablet Take 175 mcg by mouth daily before breakfast. 10/04/19   [provider]  Multiple Vitamins-Minerals (PRESERVISION AREDS 2 PO) Take 1 tablet by mouth 2 (two) times daily.    [provider]  omega-3 acid ethyl esters (LOVAZA) 1 g capsule Take 2 g by mouth 2 (two) times daily.     [provider]  pantoprazole (PROTONIX) 40 MG tablet Take 40 mg by mouth every morning. 09/29/19   [provider]  rivaroxaban (XARELTO) 2.5 MG TABS tablet Take 1 tablet (2.5 mg total) by mouth 2 (two) times daily. 08/17/19   Patwardhan, Reynold Bowen, MD  sildenafil (REVATIO) 20 MG tablet Take 100 mg by mouth daily as needed (ED).    [provider]   traMADol (ULTRAM) 50 MG tablet Take 50 mg by mouth every 6 (six) hours as needed for severe pain.    [provider]  triamcinolone cream (KENALOG) 0.1 % Apply 1 application topically 2 (two) times daily as needed (skin irritation).    [provider]  TRULICITY 2.24 VH/4.6WV SOPN Inject 0.75 mg into the skin once a week. Monday 02/26/19   [provider]     Family History  Problem Relation Age of Onset  . Hypertension Paternal Aunt   . Stroke Maternal Grandmother   . Hypertension Maternal Grandmother     Social History   Socioeconomic History  . Marital status: Single    Spouse name: Not on file  . Number of children: 0  . Years of education: Not on file  . Highest education level: Not on file  Occupational History  . Not on file  Tobacco Use  . Smoking status: Never Smoker  . Smokeless tobacco: Never Used  Vaping Use  . Vaping Use: Never used  Substance and Sexual Activity  . Alcohol use: Yes    Comment: occ  . Drug use: No  . Sexual activity: Not on file  Other Topics Concern  . Not on file  Social History Narrative  . Not on file   Social Determinants of Health   Financial Resource Strain: Not on file  Food Insecurity: Not on file  Transportation Needs: Not on file  Physical Activity: Not on file  Stress: Not on file  Social Connections: Not on file    Review of Systems: A 12 point ROS discussed and pertinent positives are indicated in the HPI above.  All other systems are negative.  Review of Systems  Constitutional: Negative for appetite change and fatigue.  Respiratory: Negative for cough and shortness of breath.   Cardiovascular: Positive for leg swelling. Negative for chest pain.  Gastrointestinal: Negative for abdominal pain, diarrhea, nausea and vomiting.  Musculoskeletal: Negative for back pain.  Neurological: Negative for dizziness and headaches.    Vital Signs: BP (!) 152/92 (BP Location: Right Arm)   Pulse 71    Temp 97.6 F (36.4 C) (Oral)   Resp 20   Wt 206 lb (93.4 kg)  SpO2 96%   BMI 32.26 kg/m   Physical Exam Constitutional:      General: He is not in acute distress. HENT:     Mouth/Throat:     Mouth: Mucous membranes are moist.     Pharynx: Oropharynx is clear.  Cardiovascular:     Rate and Rhythm: Normal rate and regular rhythm.     Pulses: Normal pulses.     Heart sounds: Normal heart sounds.  Pulmonary:     Effort: Pulmonary effort is normal.     Breath sounds: Normal breath sounds.  Abdominal:     General: Bowel sounds are normal.     Palpations: Abdomen is soft.  Musculoskeletal:     Right lower leg: Edema present.     Left lower leg: Edema present.  Skin:    General: Skin is warm and dry.  Neurological:     Mental Status: He is alert and oriented to person, place, and time.  Psychiatric:        Mood and Affect: Mood normal.        Behavior: Behavior normal.        Thought Content: Thought content normal.        Judgment: Judgment normal.     Imaging: DG Chest 1 View  Result Date: 06/22/2020 CLINICAL DATA:  Preop liver biopsy. EXAM: CHEST  1 VIEW COMPARISON:  05/08/2018 and CT chest 05/05/2020. FINDINGS: Trachea is midline. Heart size stable. Lungs are clear. No pleural fluid. IMPRESSION: No acute findings. Electronically Signed   By: Lorin Picket M.D.   On: 06/22/2020 09:53    Labs:  CBC: Recent Labs    10/22/19 0657 06/15/20 1341 06/22/20 0958 07/06/20 1015  WBC 12.1* 6.6 7.2 6.0  HGB 13.6 16.0 16.4 16.3  HCT 40.7 47.8 48.4 49.1  PLT 125* 121* 121* 132*    COAGS: Recent Labs    06/22/20 0958  INR 1.1    BMP: Recent Labs    10/18/19 1019 10/19/19 0343 10/21/19 0333 10/22/19 0657 06/15/20 1341 06/22/20 0958 07/06/20 1015  NA 142 141 138 138 141 140 143  K 3.7 3.6 4.1 4.0 4.2 4.6 3.8  CL 107 109 107 110 104 108 106  CO2 23 22 21* 20* $Remov'26 23 26  'eqdisO$ GLUCOSE 180* 137* 243* 188* 107* 136* 117*  BUN 21 28* 31* 38* 29* 27* 29*  CALCIUM  9.4 8.6* 8.9 8.9 9.4 9.4 9.1  CREATININE 1.35* 1.59* 1.35* 1.35* 1.30* 1.61* 1.51*  GFRNONAA 53* 44* 53* 53* 59* 46* 49*  GFRAA >60 51* >60 >60  --   --   --     LIVER FUNCTION TESTS: No results for input(s): BILITOT, AST, ALT, ALKPHOS, PROT, ALBUMIN in the last 8760 hours.  TUMOR MARKERS: No results for input(s): AFPTM, CEA, CA199, CHROMGRNA in the last 8760 hours.  Assessment and Plan:  Left renal lesion: Nathan Medina, 70 year old male, presents today to the [facility] department for an image-guided biopsy and cryoablation of a left renal lesion. This procedure will be done with general anesthesia and overnight observation.   Risks and benefits of an image-guided left renal cryoablation were discussed with the patient including, but not limited to, failure to treat entire lesion, bleeding, infection, damage to adjacent structures, hematuria, urine leak, decrease in renal function or post procedural neuropathy.  All of the patient's questions were answered and the patient is agreeable to proceed.  Nathan Medina has been NPO. Labs and vitals have been  reviewed. His last dose of Eliquis was 07/03/20.    Consent signed and in chart.  Thank you for this interesting consult.  I greatly enjoyed meeting Nathan Medina and look forward to participating in their care.  A copy of this report was sent to the requesting provider on this date.  Electronically Signed: Soyla Dryer, AGACNP-BC [phone] 07/06/2020, 11:24 AM   I spent a total of  30 Minutes   in face to face in clinical consultation, greater than 50% of which was counseling/coordinating care for cryoablation of left renal lesion

## 2020-07-06 NOTE — Transfer of Care (Signed)
Immediate Anesthesia Transfer of Care Note  Patient: Nathan Medina  Procedure(s) Performed: CT WITH ANESTHESIA (N/A )  Patient Location: PACU  Anesthesia Type:General  Level of Consciousness: awake and alert   Airway & Oxygen Therapy: Patient Spontanous Breathing and Patient connected to face mask oxygen  Post-op Assessment: Report given to RN and Post -op Vital signs reviewed and stable  Post vital signs: Reviewed and stable  Last Vitals:  Vitals Value Taken Time  BP 122/76 07/06/20 1503  Temp    Pulse 64 07/06/20 1504  Resp 9 07/06/20 1504  SpO2 100 % 07/06/20 1504  Vitals shown include unvalidated device data.  Last Pain:  Vitals:   07/06/20 1002  TempSrc: Oral         Complications: No complications documented.

## 2020-07-06 NOTE — Procedures (Signed)
Interventional Radiology Procedure Note  Procedure: CT LEFT RENAL MASS BX AND CRYO ABLATION    Complications: None  Estimated Blood Loss:  MIN  Findings: 18 G CORE BX OF THE LEFT RENAL MASS(C/W RCC BY IMAGING)  2 ICE FORCE NEEDLES FOR CRYO ABLATION  FULL REPORT IN PACS     Tamera Punt, MD

## 2020-07-06 NOTE — Anesthesia Procedure Notes (Signed)
Procedure Name: Intubation Date/Time: 07/06/2020 12:48 PM Performed by: Sharlette Dense, CRNA Patient Re-evaluated:Patient Re-evaluated prior to induction Oxygen Delivery Method: Circle system utilized Preoxygenation: Pre-oxygenation with 100% oxygen Induction Type: IV induction Ventilation: Mask ventilation without difficulty and Oral airway inserted - appropriate to patient size Laryngoscope Size: Miller and 3 Grade View: Grade I Tube type: Oral Tube size: 8.0 mm Number of attempts: 1 Airway Equipment and Method: Stylet Placement Confirmation: ETT inserted through vocal cords under direct vision,  positive ETCO2 and breath sounds checked- equal and bilateral Secured at: 22 cm Tube secured with: Tape Dental Injury: Teeth and Oropharynx as per pre-operative assessment

## 2020-07-06 NOTE — Anesthesia Postprocedure Evaluation (Signed)
Anesthesia Post Note  Patient: Nathan Medina  Procedure(s) Performed: CT WITH ANESTHESIA (N/A )     Patient location during evaluation: PACU Anesthesia Type: General Level of consciousness: awake and alert and oriented Pain management: pain level controlled Vital Signs Assessment: post-procedure vital signs reviewed and stable Respiratory status: spontaneous breathing, nonlabored ventilation and respiratory function stable Cardiovascular status: blood pressure returned to baseline and stable Postop Assessment: no headache, no backache and no apparent nausea or vomiting Anesthetic complications: no   No complications documented.  Last Vitals:  Vitals:   07/06/20 1515 07/06/20 1530  BP: 100/85 123/73  Pulse: 62 65  Resp: 18 19  Temp:    SpO2: 100% 100%    Last Pain:  Vitals:   07/06/20 1540  TempSrc:   PainSc: [place]

## 2020-07-06 NOTE — Sedation Documentation (Signed)
Anesthesia in to sedate and monitor. 

## 2020-07-06 NOTE — Anesthesia Preprocedure Evaluation (Addendum)
Anesthesia Evaluation  Patient identified by MRN, date of birth, ID band Patient awake    Reviewed: Allergy & Precautions, NPO status , Patient's Chart, lab work & pertinent test results  Airway Mallampati: II  TM Distance: >3 FB Neck ROM: Full    Dental no notable dental hx. (+) Teeth Intact, Dental Advisory Given   Pulmonary neg pulmonary ROS,    Pulmonary exam normal breath sounds clear to auscultation       Cardiovascular hypertension, Pt. on home beta blockers and Pt. on medications + CAD, + Past MI, + CABG (2019), + Peripheral Vascular Disease and +CHF  Normal cardiovascular exam+ dysrhythmias (xarelto) Atrial Fibrillation  Rhythm:Regular Rate:Normal   last seen by cardiology 05/05/2020. Per OV note, "status post coronary artery bypass grafting, with known underlying depressed LV function preoperative echocardiogram showed 45 to 45% ejection fraction currently patient stable from a cardiac standpoint without recurrent symptoms or evidence of congestive heart failure."   Neuro/Psych PSYCHIATRIC DISORDERS Anxiety Depression    GI/Hepatic Neg liver ROS, GERD  Medicated and Controlled,  Endo/Other  diabetes, Well Controlled, Type 2, Insulin DependentHypothyroidism Obesity BMI 32 Last a1c 6.6  Renal/GU Renal disease (renal mass)  negative genitourinary   Musculoskeletal  (+) Arthritis , Osteoarthritis,    Abdominal (+) + obese,   Peds  Hematology negative hematology ROS (+)   Anesthesia Other Findings eliquis LD 2/13  Reproductive/Obstetrics negative OB ROS                           Anesthesia Physical Anesthesia Plan  ASA: III  Anesthesia Plan: General   Post-op Pain Management:    Induction: Intravenous  PONV Risk Score and Plan: 2 and Ondansetron, Dexamethasone and Treatment may vary due to age or medical condition  Airway Management Planned: Oral ETT  Additional Equipment:  None  Intra-op Plan:   Post-operative Plan: Extubation in OR  Informed Consent: I have reviewed the patients History and Physical, chart, labs and discussed the procedure including the risks, benefits and alternatives for the proposed anesthesia with the patient or authorized representative who has indicated his/her understanding and acceptance.     Dental advisory given  Plan Discussed with: CRNA  Anesthesia Plan Comments:        Anesthesia Quick Evaluation

## 2020-07-07 ENCOUNTER — Encounter (HOSPITAL_COMMUNITY): Payer: Self-pay | Admitting: Interventional Radiology

## 2020-07-07 DIAGNOSIS — E890 Postprocedural hypothyroidism: Secondary | ICD-10-CM | POA: Diagnosis not present

## 2020-07-07 DIAGNOSIS — E1122 Type 2 diabetes mellitus with diabetic chronic kidney disease: Secondary | ICD-10-CM | POA: Diagnosis not present

## 2020-07-07 DIAGNOSIS — I130 Hypertensive heart and chronic kidney disease with heart failure and stage 1 through stage 4 chronic kidney disease, or unspecified chronic kidney disease: Secondary | ICD-10-CM | POA: Diagnosis not present

## 2020-07-07 DIAGNOSIS — E785 Hyperlipidemia, unspecified: Secondary | ICD-10-CM | POA: Diagnosis not present

## 2020-07-07 DIAGNOSIS — C642 Malignant neoplasm of left kidney, except renal pelvis: Secondary | ICD-10-CM | POA: Diagnosis not present

## 2020-07-07 DIAGNOSIS — N183 Chronic kidney disease, stage 3 unspecified: Secondary | ICD-10-CM | POA: Diagnosis not present

## 2020-07-07 DIAGNOSIS — I509 Heart failure, unspecified: Secondary | ICD-10-CM | POA: Diagnosis not present

## 2020-07-07 DIAGNOSIS — E1151 Type 2 diabetes mellitus with diabetic peripheral angiopathy without gangrene: Secondary | ICD-10-CM | POA: Diagnosis not present

## 2020-07-07 DIAGNOSIS — K760 Fatty (change of) liver, not elsewhere classified: Secondary | ICD-10-CM | POA: Diagnosis not present

## 2020-07-07 LAB — CBC
HCT: 44.6 % (ref 39.0–52.0)
Hemoglobin: 14.8 g/dL (ref 13.0–17.0)
MCH: 32.3 pg (ref 26.0–34.0)
MCHC: 33.2 g/dL (ref 30.0–36.0)
MCV: 97.4 fL (ref 80.0–100.0)
Platelets: 134 10*3/uL — ABNORMAL LOW (ref 150–400)
RBC: 4.58 MIL/uL (ref 4.22–5.81)
RDW: 13.2 % (ref 11.5–15.5)
WBC: 11.7 10*3/uL — ABNORMAL HIGH (ref 4.0–10.5)
nRBC: 0 % (ref 0.0–0.2)

## 2020-07-07 LAB — BASIC METABOLIC PANEL
Anion gap: 9 (ref 5–15)
BUN: 39 mg/dL — ABNORMAL HIGH (ref 8–23)
CO2: 25 mmol/L (ref 22–32)
Calcium: 8.6 mg/dL — ABNORMAL LOW (ref 8.9–10.3)
Chloride: 104 mmol/L (ref 98–111)
Creatinine, Ser: 1.94 mg/dL — ABNORMAL HIGH (ref 0.61–1.24)
GFR, Estimated: 37 mL/min — ABNORMAL LOW (ref >60)
Glucose, Bld: 202 mg/dL — ABNORMAL HIGH (ref 70–99)
Potassium: 4.4 mmol/L (ref 3.5–5.1)
Sodium: 138 mmol/L (ref 135–145)

## 2020-07-07 LAB — SURGICAL PATHOLOGY

## 2020-07-07 LAB — GLUCOSE, CAPILLARY: Glucose-Capillary: 185 mg/dL — ABNORMAL HIGH (ref 70–99)

## 2020-07-07 NOTE — Discharge Summary (Signed)
Patient ID: Nathan Medina MRN: [id number] DOB/AGE: 1951/04/21 70 y.o.  Admit date: 07/06/2020 Discharge date: 07/07/2020  Supervising Physician: Arne Cleveland  Patient Status: [facility] - In-pt  Admission Diagnoses: Renal cell carcinoma  Discharge Diagnoses:  Active Problems:   Renal cell carcinoma [facility])   Discharged Condition: stable  Hospital Course:  Patient presented to [facility] 07/06/2020 for an image-guided left renal mass cryoablation with Dr. Annamaria Boots. Procedure occurred without major complications and patient was transferred to floor in stable condition (VSS, left flank puncture site stable) for overnight observation. No major events occurred overnight.  Patient awake and alert sitting in chair with no complaints at this time. Foley catheter removed this AM without difficulty. Patient denies left flank pain or hematuria. Left flank puncture site stable. Plan to discharge home today and follow-up with Dr. Annamaria Boots for televisit 3-4 weeks after discharge.   Consults: None  Significant Diagnostic Studies: DG Chest 1 View  Result Date: 06/22/2020 CLINICAL DATA:  Preop liver biopsy. EXAM: CHEST  1 VIEW COMPARISON:  05/08/2018 and CT chest 05/05/2020. FINDINGS: Trachea is midline. Heart size stable. Lungs are clear. No pleural fluid. IMPRESSION: No acute findings. Electronically Signed   By: Lorin Picket M.D.   On: 06/22/2020 09:53   CT GUIDE TISSUE ABLATION  Result Date: 07/06/2020 INDICATION: Enlarging exophytic left renal mass concerning for low-grade renal neoplasm by imaging EXAM: CT-GUIDED LEFT RENAL MASS 18 GAUGE CORE BIOPSY AND CRYOABLATION ANESTHESIA/SEDATION: General MEDICATIONS: ANCEF 2 G. The antibiotic was administered in an appropriate time interval prior to needle puncture of the skin. CONTRAST:  NONE. PROCEDURE: The procedure, risks, benefits, and alternatives were explained to the patient. Questions regarding the procedure were encouraged and answered. The patient  understands and consents to the procedure. The patient was placed under general anesthesia. Initial unenhanced CT was performed in a PRONE position to localize the lateral exophytic left renal mass. Imaging was correlated with the prior CT and MRI. The patient was prepped with ChloraPrep in a sterile fashion, and a sterile drape was applied covering the operative field. A sterile gown and sterile gloves were used for the procedure. Under CT guidance, 2 ice force percutaneous cryoablation probes were advanced into the superior and inferior portions of the left renal mass, separated by approximately 1.2 cm. Probe positioning was confirmed by CT prior to cryoablation. Needles were frozen in initial position. Prior to ablation, a 31 gauge coaxial guide was advanced to the lateral margin of the left renal mass. Needle position reconfirmed with CT. A single 18 gauge core biopsy obtained. Sample was placed in formalin. Needle removed. Next, an 18 gauge 10 cm trocar needle was advanced lateral to the left renal mass and adjacent to the left colon. 120 cc of dilute contrast solution was instilled for hydro dissection to further separate the renal mass from the adjacent colon. Separation from the colon was approximately 1.5 cm. Cryoablation was performed through the the 2 ice force cryo ablation needles. Initial 10 minute cycle of cryoablation was performed. This was followed by a 6 minute thaw cycle. A second 10 minute cycle of cryoablation was then performed. During ablation, periodic CT imaging was performed to monitor ice ball formation and morphology. After active thaw and cautery, the cryoablation probes were removed. Post-procedural CT was performed. COMPLICATIONS: None immediate. FINDINGS: Imaging confirms 2 cryo ablation needles placed in the superior and inferior aspects of the exophytic left renal mass for cryo ablation. Biopsy needle also inserted for 18 gauge core biopsy as  detailed above. Surveillance imaging  during the treatment confirms adequate ice ball coverage of the lesion and separation from the left descending colon by the hydro dissection. IMPRESSION: CT guided left renal mass 18 gauge core biopsy and cryoablation as detailed above. The patient will be observed overnight. Initial follow-up will be performed in approximately 4 weeks. Electronically Signed   By: Jerilynn Mages.  Shick M.D.   On: 07/06/2020 16:03   CT BIOPSY  Result Date: 07/06/2020 INDICATION: Enlarging exophytic left renal mass concerning for low-grade renal neoplasm by imaging EXAM: CT-GUIDED LEFT RENAL MASS 18 GAUGE CORE BIOPSY AND CRYOABLATION ANESTHESIA/SEDATION: General MEDICATIONS: ANCEF 2 G. The antibiotic was administered in an appropriate time interval prior to needle puncture of the skin. CONTRAST:  NONE. PROCEDURE: The procedure, risks, benefits, and alternatives were explained to the patient. Questions regarding the procedure were encouraged and answered. The patient understands and consents to the procedure. The patient was placed under general anesthesia. Initial unenhanced CT was performed in a PRONE position to localize the lateral exophytic left renal mass. Imaging was correlated with the prior CT and MRI. The patient was prepped with ChloraPrep in a sterile fashion, and a sterile drape was applied covering the operative field. A sterile gown and sterile gloves were used for the procedure. Under CT guidance, 2 ice force percutaneous cryoablation probes were advanced into the superior and inferior portions of the left renal mass, separated by approximately 1.2 cm. Probe positioning was confirmed by CT prior to cryoablation. Needles were frozen in initial position. Prior to ablation, a 58 gauge coaxial guide was advanced to the lateral margin of the left renal mass. Needle position reconfirmed with CT. A single 18 gauge core biopsy obtained. Sample was placed in formalin. Needle removed. Next, an 18 gauge 10 cm trocar needle was advanced  lateral to the left renal mass and adjacent to the left colon. 120 cc of dilute contrast solution was instilled for hydro dissection to further separate the renal mass from the adjacent colon. Separation from the colon was approximately 1.5 cm. Cryoablation was performed through the the 2 ice force cryo ablation needles. Initial 10 minute cycle of cryoablation was performed. This was followed by a 6 minute thaw cycle. A second 10 minute cycle of cryoablation was then performed. During ablation, periodic CT imaging was performed to monitor ice ball formation and morphology. After active thaw and cautery, the cryoablation probes were removed. Post-procedural CT was performed. COMPLICATIONS: None immediate. FINDINGS: Imaging confirms 2 cryo ablation needles placed in the superior and inferior aspects of the exophytic left renal mass for cryo ablation. Biopsy needle also inserted for 18 gauge core biopsy as detailed above. Surveillance imaging during the treatment confirms adequate ice ball coverage of the lesion and separation from the left descending colon by the hydro dissection. IMPRESSION: CT guided left renal mass 18 gauge core biopsy and cryoablation as detailed above. The patient will be observed overnight. Initial follow-up will be performed in approximately 4 weeks. Electronically Signed   By: Jerilynn Mages.  Shick M.D.   On: 07/06/2020 16:03    Treatments: CT-guided cryoablation of left renal mass  Discharge Exam: Blood pressure 104/70, pulse 77, temperature 98 F (36.7 C), temperature source Oral, resp. rate 16, weight 206 lb (93.4 kg), SpO2 (!) 88 %. Physical Exam Vitals and nursing note reviewed.  Constitutional:      General: He is not in acute distress.    Appearance: Normal appearance.  Cardiovascular:     Rate and Rhythm: Normal  rate and regular rhythm.     Heart sounds: Normal heart sounds. No murmur heard.   Pulmonary:     Effort: Pulmonary effort is normal. No respiratory distress.      Breath sounds: Normal breath sounds.  Musculoskeletal:     Comments: Left flank puncture site soft with minimal amount of dried blood on bandage, no active bleeding or hematoma noted.  Skin:    General: Skin is warm and dry.  Neurological:     Mental Status: He is alert and oriented to person, place, and time.     Disposition: Discharge disposition: 01-Home or Self Care       Discharge Instructions    Call MD for:  difficulty breathing, headache or visual disturbances   Complete by: As directed    Call MD for:  extreme fatigue   Complete by: As directed    Call MD for:  hives   Complete by: As directed    Call MD for:  persistant dizziness or light-headedness   Complete by: As directed    Call MD for:  persistant nausea and vomiting   Complete by: As directed    Call MD for:  redness, tenderness, or signs of infection (pain, swelling, redness, odor or green/yellow discharge around incision site)   Complete by: As directed    Call MD for:  severe uncontrolled pain   Complete by: As directed    Call MD for:  temperature >100.4   Complete by: As directed    Diet - low sodium heart healthy   Complete by: As directed    Discharge instructions   Complete by: As directed    Ok to shower 48 hours post-procedure. Recommend showering with bandage on, remove bandage immediately after showering and pat area dry. No further dressing changes needed after this- ensure area remains clean and dry until fully healed. No submerging (swimming, bathing) for 7 days post-procedure.   Increase activity slowly   Complete by: As directed    No stooping, bending, or lifting more than 10 pounds for 1 week.   Lifting restrictions   Complete by: As directed    No stooping, bending, or lifting more than 10 pounds for 1 week.   Remove dressing in 24 hours   Complete by: As directed    Following first shower. Please see showering instructions for more information on this.     Allergies as of  07/07/2020      Reactions   Ciprofloxacin Rash   Escitalopram Oxalate Itching   Quinapril Hcl Rash   Sulfa Antibiotics Swelling   Swelling in the ankles      Medication List    TAKE these medications   alendronate 70 MG tablet Commonly known as: FOSAMAX Take 70 mg by mouth once a week. Take with a full glass of water on an empty stomach.   allopurinol 300 MG tablet Commonly known as: ZYLOPRIM TAKE 1 TABLET BY MOUTH EVERY DAY   ALPRAZolam 0.5 MG tablet Commonly known as: XANAX Take 1 tablet (0.5 mg total) by mouth daily as needed for sleep. 1/2 - 1 by mouth once daily as needed What changed:   how much to take  when to take this  additional instructions   aspirin EC 81 MG tablet Take 81 mg by mouth every other day.   atorvastatin 10 MG tablet Commonly known as: LIPITOR Take 10 mg by mouth at bedtime.   calcium carbonate 500 MG chewable tablet Commonly known as: Tums  Chew 2 tablets (400 mg of elemental calcium total) by mouth 2 (two) times daily. What changed:   when to take this  reasons to take this   carvedilol 6.25 MG tablet Commonly known as: COREG Take 1 tablet (6.25 mg total) by mouth 2 (two) times daily.   diphenhydrAMINE 25 MG tablet Commonly known as: BENADRYL Take 25 mg by mouth at bedtime as needed for sleep.   Entresto 49-51 MG Generic drug: sacubitril-valsartan Take 1 tablet by mouth twice daily   furosemide 40 MG tablet Commonly known as: LASIX Take 40 mg by mouth daily.   guaiFENesin 600 MG 12 hr tablet Commonly known as: MUCINEX Take 1,200 mg by mouth 2 (two) times daily as needed for cough or to loosen phlegm.   insulin glargine 100 unit/mL Sopn Commonly known as: LANTUS Inject 44 Units into the skin at bedtime.   levothyroxine 175 MCG tablet Commonly known as: SYNTHROID Take 175 mcg by mouth daily before breakfast.   omega-3 acid ethyl esters 1 g capsule Commonly known as: LOVAZA Take 2 g by mouth 2 (two) times daily.    pantoprazole 40 MG tablet Commonly known as: PROTONIX Take 40 mg by mouth every morning.   PRESERVISION AREDS 2 PO Take 1 tablet by mouth 2 (two) times daily.   sildenafil 20 MG tablet Commonly known as: REVATIO Take 100 mg by mouth daily as needed (ED).   traMADol 50 MG tablet Commonly known as: ULTRAM Take 50 mg by mouth every 6 (six) hours as needed for severe pain.   triamcinolone 0.1 % Commonly known as: KENALOG Apply 1 application topically 2 (two) times daily as needed (skin irritation).   Trulicity 6.75 FF/6.3WG Sopn Generic drug: Dulaglutide Inject 0.75 mg into the skin once a week. Monday   Vitamin D-3 25 MCG (1000 UT) Caps Take 1,000 Units by mouth at bedtime.   Xarelto 2.5 MG Tabs tablet Generic drug: rivaroxaban Take 1 tablet (2.5 mg total) by mouth 2 (two) times daily.       Follow-up Information    Greggory Keen, MD Follow up in 4 week(s).   Specialties: Interventional Radiology, Radiology Why: Please follow-up with Dr. Annamaria Boots for televisit 3-4 weeks after discharge. Our office will call you to set up this appointment. Contact information: [address] [place] [address] [phone]                Electronically Signed: Earley Abide, PA-C 07/07/2020, 11:28 AM   I have spent Greater Than 30 Minutes discharging Nathan Medina.

## 2020-07-07 NOTE — Discharge Instructions (Signed)
Cryoablation of Renal Tumors, Care After This sheet gives you information about how to care for yourself after your procedure. Your health care provider may also give you more specific instructions. If you have problems or questions, contact your health care provider. What can I expect after the procedure? After your procedure, it is common to have pain and discomfort in the upper abdomen. You will be given pain medicines to control this. Follow these instructions at home: Incision care  Follow instructions from your health care provider about how to take care of your incision. Make sure you: ? Wash your hands with soap and water before you change your bandage (dressing). If soap and water are not available, use hand sanitizer. ? Ok to shower 48 hours following procedure. Recommend showering with bandage on, remove bandage immediately after showering and pat area dry. No further dressing changes needed after this- ensure area remains clean and dry until fully healed. ? No submerging (swimming, bathing) for 7 days post-procedure.  Check your incision area every day for signs of infection. Check for: ? Redness, swelling, or pain. ? Fluid or blood. ? Warmth. ? Pus or a bad smell.     Activity  Rest as often as needing during the first few days of recovery.  No stooping, bending, or lifting more than 10 pounds for 1 week. General instructions  To prevent or treat constipation while you are taking prescription pain medicine, your health care provider may recommend that you: ? Drink enough fluid to keep your urine clear or pale yellow. ? Take over-the-counter or prescription medicines. ? Eat foods that are high in fiber, such as fresh fruits and vegetables, whole grains, and beans. ? Limit foods that are high in fat and processed sugars, such as fried and sweet foods.  Do not use any products that contain nicotine or tobacco, such as cigarettes and e-cigarettes. If you need help quitting,  ask your health care provider.  Keep all follow-up visits as told by your health care provider. This is important- 3-4 week televisit with Dr. Annamaria Boots. Contact a health care provider if:  You cannot pass gas.  You are unable to have a bowel movement within 3 days.  You have a skin rash. Get help right away if:  You have a fever.  You have severe or lasting pain in your abdomen, shoulder, or back.  You have trouble swallowing or breathing.  You have severe weakness or dizziness.  You have chest pain or shortness of breath.  This information is not intended to replace advice given to you by your health care provider. Make sure you discuss any questions you have with your health care provider. Document Revised: 04/19/2017 Document Reviewed: 07/26/2016 Elsevier Patient Education  [place].

## 2020-07-14 DIAGNOSIS — E890 Postprocedural hypothyroidism: Secondary | ICD-10-CM | POA: Diagnosis not present

## 2020-07-18 DIAGNOSIS — N2581 Secondary hyperparathyroidism of renal origin: Secondary | ICD-10-CM | POA: Diagnosis not present

## 2020-07-18 DIAGNOSIS — E1122 Type 2 diabetes mellitus with diabetic chronic kidney disease: Secondary | ICD-10-CM | POA: Diagnosis not present

## 2020-07-18 DIAGNOSIS — I129 Hypertensive chronic kidney disease with stage 1 through stage 4 chronic kidney disease, or unspecified chronic kidney disease: Secondary | ICD-10-CM | POA: Diagnosis not present

## 2020-07-18 DIAGNOSIS — D631 Anemia in chronic kidney disease: Secondary | ICD-10-CM | POA: Diagnosis not present

## 2020-07-18 DIAGNOSIS — N2889 Other specified disorders of kidney and ureter: Secondary | ICD-10-CM | POA: Diagnosis not present

## 2020-07-18 DIAGNOSIS — N183 Chronic kidney disease, stage 3 unspecified: Secondary | ICD-10-CM | POA: Diagnosis not present

## 2020-07-26 ENCOUNTER — Ambulatory Visit
Admission: RE | Admit: 2020-07-26 | Discharge: 2020-07-26 | Disposition: A | Discharge status: Home / Self Care | Payer: Medicare PPO | Source: Ambulatory Visit | Attending: Student | Admitting: Student

## 2020-07-26 ENCOUNTER — Encounter: Payer: Self-pay | Admitting: *Deleted

## 2020-07-26 ENCOUNTER — Other Ambulatory Visit: Payer: Self-pay

## 2020-07-26 DIAGNOSIS — C642 Malignant neoplasm of left kidney, except renal pelvis: Secondary | ICD-10-CM | POA: Diagnosis not present

## 2020-07-26 HISTORY — PX: IR RADIOLOGIST EVAL & MGMT: IMG5224

## 2020-07-26 NOTE — Progress Notes (Signed)
Patient ID: Nathan Medina, male   DOB: Oct 09, 1950, 70 y.o.   MRN: [id number]       Chief Complaint:  Left renal mass  Referring Physician(s): Louk,Alexandra M  History of Present Illness: Nathan Medina is a 70 y.o. male with a complicated medical history including hypertension, chronic kidney disease, diabetes, ischemic cardiomyopathy, and prior thyroidectomy.  He was found to have a left renal midpole exophytic mass.  Surveillance imaging confirms slow growth and characteristics compatible with a low-grade neoplasm by CT.  He underwent successful biopsy and cryoablation 07/06/2020 at [facility]  He had an uneventful recovery and was discharged the following day.  Since the procedure he has done very well.  He is recovered completely.  No residual flank or abdominal pain.  No dysuria hematuria.  No other urinary tract symptoms.  Excellent functional status.  No physical limitations.  He has not had any surveillance imaging at this point.  Biopsy confirmed papillary renal cell carcinoma.  Past Medical History:  Diagnosis Date  . Abnormal liver function test 05/23/2011  . ALLERGIC RHINITIS   . ANXIETY   . Aortic atherosclerosis ([place])   . Arthritis   . Ascending aorta dilation ([facility]) 07/24/2018  . Bilateral renal cysts   . Cervical spondylolysis    Moderate  . CHF (congestive heart failure) ([place]) 07/24/2018  . Chronic kidney disease    CKD stage 3 per office visit note 08/08/17 on chart   . COLONIC POLYPS, HX OF   . Coronary artery disease   . DEPRESSION   . DIABETES MELLITUS, TYPE II   . Diverticulitis   . Epidermal cyst   . Fatty liver   . GERD   . GOUT   . History of inguinal hernia   . HOH (hard of hearing)    elft ear  . HYPERLIPIDEMIA   . HYPERTENSION   . IBS   . 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 Ischemic cardiomyopathy 07/24/2018  . Left renal mass 05/23/2011  .  Lumbar radicular pain 06/03/2011  . Macular degeneration    right eye  . Multinodular goiter   . Myocardial infarction ([place])    silent MI in Pt's 40s  . Pancreatic pseudocyst    Stable  . Peripheral vascular disease ([place])   . Peritonsillar abscess   . Pulmonary nodule    Right upper lobe  . Radial neck fracture   . Right upper lobe pulmonary nodule 07/24/2018  . Thyroid disease   . Thyroid nodule    Bilateral  . Vertigo     Past Surgical History:  Procedure Laterality Date  . APPENDECTOMY  02/12/11  . COLON SURGERY  08/11/10   sigmoid colectomy  . COLON SURGERY  02/12/11   colostomy takedown  . COLONOSCOPY    . CORONARY ARTERY BYPASS GRAFT N/A 04/01/2018   Procedure: CORONARY ARTERY BYPASS GRAFTING (CABG) x Three, using left internal mammary artery and right leg greater saphenous vein harvested endoscopically;  Surgeon: Grace Isaac, MD;  Location: [facility];  Service: Open Heart Surgery;  Laterality: N/A;  . CYST REMOVAL TRUNK Right 02/20/2018   Procedure: epidermal cyst excision right lower back;  Surgeon: [name], MD;  Location: [facility];  Service: General;  Laterality: Right;  . HERNIA REPAIR    . INSERTION OF MESH N/A 08/20/2012   Procedure: INSERTION OF MESH;  Surgeon: [name], MD;  Location: [facility];  Service: General;  Laterality: N/A;  . IR RADIOLOGIST EVAL & MGMT  03/23/2020  . KNEE ARTHROSCOPY Right 10/18/2019  . LEFT HEART CATH AND CORONARY ANGIOGRAPHY N/A 01/14/2018   Procedure: LEFT HEART CATH AND CORONARY ANGIOGRAPHY;  Surgeon: [name], MD;  Location: [facility];  Service: Cardiovascular;  Laterality: N/A;  . LYSIS OF ADHESION  08/20/2012   Procedure: LYSIS OF ADHESION;  Surgeon: [name], MD;  Location: [facility];  Service: General;;  . QUADRICEPS TENDON REPAIR Right 10/20/2019   Procedure: REPAIR RIGHT QUADRICEP TENDON;  Surgeon: Paralee Cancel, MD;  Location: [facility];  Service: Orthopedics;  Laterality: Right;  . RADIOLOGY WITH  ANESTHESIA N/A 06/22/2020   Procedure: CT WITH ANESTHESIA  CRYOABLATION;  Surgeon: Greggory Keen, MD;  Location: [facility];  Service: Anesthesiology;  Laterality: N/A;  . RADIOLOGY WITH ANESTHESIA N/A 07/06/2020   Procedure: CT WITH ANESTHESIA;  Surgeon: Greggory Keen, MD;  Location: [facility];  Service: Anesthesiology;  Laterality: N/A;  . SEPTOPLASTY  age 89  . TEE WITHOUT CARDIOVERSION N/A 04/01/2018   Procedure: TRANSESOPHAGEAL ECHOCARDIOGRAM (TEE);  Surgeon: Grace Isaac, MD;  Location: [facility];  Service: Open Heart Surgery;  Laterality: N/A;  . THYROIDECTOMY N/A 10/17/2017   Procedure: TOTAL THYROIDECTOMY;  Surgeon: [name], MD;  Location[facility];  Service: General;  Laterality: N/A;  . TONSILLECTOMY Right 08/01/2016   Procedure: INCISION AND DRAINAGE RIGHT PERI-TONSILLAR ABSCESS;  Surgeon: [name], MD;  Location: [facility];  Service: ENT;  Laterality: Right;  . ULTRASOUND GUIDANCE FOR VASCULAR ACCESS  01/14/2018   Procedure: Ultrasound Guidance For Vascular Access;  Surgeon: [name], MD;  Location: [facility];  Service: Cardiovascular;;  . VENTRAL HERNIA REPAIR  08/20/2012   Procedure: HERNIA REPAIR VENTRAL ADULT;  Surgeon: [name], MD;  Location[facility];  Service: General;;    Allergies: Ciprofloxacin, Escitalopram oxalate, Quinapril hcl, and Sulfa antibiotics  Medications: Prior to Admission medications   Medication Sig Start Date End Date Taking? Authorizing Provider  alendronate (FOSAMAX) 70 MG tablet Take 70 mg by mouth once a week. Take with a full glass of water on an empty stomach.    [provider]  allopurinol (ZYLOPRIM) 300 MG tablet TAKE 1 TABLET BY MOUTH EVERY DAY Patient taking differently: Take 300 mg by mouth daily. 05/16/13   Biagio Borg, MD  ALPRAZolam Duanne Moron) 0.5 MG tablet Take 1 tablet (0.5 mg total) by mouth daily as needed for sleep. 1/2 - 1 by mouth once daily as needed Patient taking differently: Take 0.25-0.5 mg by  mouth at bedtime as needed for sleep. 04/03/12   Biagio Borg, MD  aspirin EC 81 MG tablet Take 81 mg by mouth every other day.     [provider]  atorvastatin (LIPITOR) 10 MG tablet Take 10 mg by mouth at bedtime.    [provider]  calcium carbonate (TUMS) 500 MG chewable tablet Chew 2 tablets (400 mg of elemental calcium total) by mouth 2 (two) times daily. Patient taking differently: Chew 2 tablets by mouth 2 (two) times daily as needed for indigestion. 10/18/17   [name], MD  carvedilol (COREG) 6.25 MG tablet Take 1 tablet (6.25 mg total) by mouth 2 (two) times daily. 05/05/19   Patwardhan, Reynold Bowen, MD  Cholecalciferol (VITAMIN D-3) 25 MCG (1000 UT) CAPS Take 1,000 Units by mouth at bedtime.    [provider]  diphenhydrAMINE (BENADRYL) 25  MG tablet Take 25 mg by mouth at bedtime as needed for sleep.    [provider]  ENTRESTO 49-51 MG Take 1 tablet by mouth twice daily Patient taking differently: Take 1 tablet by mouth 2 (two) times daily. 06/08/20   Patwardhan, Reynold Bowen, MD  furosemide (LASIX) 40 MG tablet Take 40 mg by mouth daily.    [provider]  guaiFENesin (MUCINEX) 600 MG 12 hr tablet Take 1,200 mg by mouth 2 (two) times daily as needed for cough or to loosen phlegm.    [provider]  insulin glargine (LANTUS) 100 unit/mL SOPN Inject 44 Units into the skin at bedtime.    [provider]  levothyroxine (SYNTHROID) 175 MCG tablet Take 175 mcg by mouth daily before breakfast. 10/04/19   [provider]  Multiple Vitamins-Minerals (PRESERVISION AREDS 2 PO) Take 1 tablet by mouth 2 (two) times daily.    [provider]  omega-3 acid ethyl esters (LOVAZA) 1 g capsule Take 2 g by mouth 2 (two) times daily.     [provider]  pantoprazole (PROTONIX) 40 MG tablet Take 40 mg by mouth every morning. 09/29/19   [provider]  rivaroxaban (XARELTO) 2.5 MG TABS tablet Take 1 tablet  (2.5 mg total) by mouth 2 (two) times daily. 08/17/19   Patwardhan, Reynold Bowen, MD  sildenafil (REVATIO) 20 MG tablet Take 100 mg by mouth daily as needed (ED).    [provider]  traMADol (ULTRAM) 50 MG tablet Take 50 mg by mouth every 6 (six) hours as needed for severe pain.    [provider]  triamcinolone cream (KENALOG) 0.1 % Apply 1 application topically 2 (two) times daily as needed (skin irritation).    [provider]  TRULICITY 7.32 KG/2.5KY SOPN Inject 0.75 mg into the skin once a week. Monday 02/26/19   [provider]     Family History  Problem Relation Age of Onset  . Hypertension Paternal Aunt   . Stroke Maternal Grandmother   . Hypertension Maternal Grandmother     Social History   Socioeconomic History  . Marital status: Single    Spouse name: Not on file  . Number of children: 0  . Years of education: Not on file  . Highest education level: Not on file  Occupational History  . Not on file  Tobacco Use  . Smoking status: Never Smoker  . Smokeless tobacco: Never Used  Vaping Use  . Vaping Use: Never used  Substance and Sexual Activity  . Alcohol use: Yes    Comment: occ  . Drug use: No  . Sexual activity: Not on file  Other Topics Concern  . Not on file  Social History Narrative  . Not on file   Social Determinants of Health   Financial Resource Strain: Not on file  Food Insecurity: Not on file  Transportation Needs: Not on file  Physical Activity: Not on file  Stress: Not on file  Social Connections: Not on file     Review of Systems  Review of Systems: A 12 point ROS discussed and pertinent positives are indicated in the HPI above.  All other systems are negative.  Physical Exam No direct physical exam was performed, telephone health visit only today because of Covid pandemic   Vital Signs: There were no vitals taken for this visit.  Imaging: CT GUIDE TISSUE ABLATION  Result Date:  07/06/2020 INDICATION: Enlarging exophytic left renal mass concerning for low-grade renal  neoplasm by imaging EXAM: CT-GUIDED LEFT RENAL MASS 18 GAUGE CORE BIOPSY AND CRYOABLATION ANESTHESIA/SEDATION: General MEDICATIONS: ANCEF 2 G. The antibiotic was administered in an appropriate time interval prior to needle puncture of the skin. CONTRAST:  NONE. PROCEDURE: The procedure, risks, benefits, and alternatives were explained to the patient. Questions regarding the procedure were encouraged and answered. The patient understands and consents to the procedure. The patient was placed under general anesthesia. Initial unenhanced CT was performed in a PRONE position to localize the lateral exophytic left renal mass. Imaging was correlated with the prior CT and MRI. The patient was prepped with ChloraPrep in a sterile fashion, and a sterile drape was applied covering the operative field. A sterile gown and sterile gloves were used for the procedure. Under CT guidance, 2 ice force percutaneous cryoablation probes were advanced into the superior and inferior portions of the left renal mass, separated by approximately 1.2 cm. Probe positioning was confirmed by CT prior to cryoablation. Needles were frozen in initial position. Prior to ablation, a 67 gauge coaxial guide was advanced to the lateral margin of the left renal mass. Needle position reconfirmed with CT. A single 18 gauge core biopsy obtained. Sample was placed in formalin. Needle removed. Next, an 18 gauge 10 cm trocar needle was advanced lateral to the left renal mass and adjacent to the left colon. 120 cc of dilute contrast solution was instilled for hydro dissection to further separate the renal mass from the adjacent colon. Separation from the colon was approximately 1.5 cm. Cryoablation was performed through the the 2 ice force cryo ablation needles. Initial 10 minute cycle of cryoablation was performed. This was followed by a 6 minute thaw cycle. A second 10  minute cycle of cryoablation was then performed. During ablation, periodic CT imaging was performed to monitor ice ball formation and morphology. After active thaw and cautery, the cryoablation probes were removed. Post-procedural CT was performed. COMPLICATIONS: None immediate. FINDINGS: Imaging confirms 2 cryo ablation needles placed in the superior and inferior aspects of the exophytic left renal mass for cryo ablation. Biopsy needle also inserted for 18 gauge core biopsy as detailed above. Surveillance imaging during the treatment confirms adequate ice ball coverage of the lesion and separation from the left descending colon by the hydro dissection. IMPRESSION: CT guided left renal mass 18 gauge core biopsy and cryoablation as detailed above. The patient will be observed overnight. Initial follow-up will be performed in approximately 4 weeks. Electronically Signed   By: Jerilynn Mages.  Kayd Launer M.D.   On: 07/06/2020 16:03   CT BIOPSY  Result Date: 07/06/2020 INDICATION: Enlarging exophytic left renal mass concerning for low-grade renal neoplasm by imaging EXAM: CT-GUIDED LEFT RENAL MASS 18 GAUGE CORE BIOPSY AND CRYOABLATION ANESTHESIA/SEDATION: General MEDICATIONS: ANCEF 2 G. The antibiotic was administered in an appropriate time interval prior to needle puncture of the skin. CONTRAST:  NONE. PROCEDURE: The procedure, risks, benefits, and alternatives were explained to the patient. Questions regarding the procedure were encouraged and answered. The patient understands and consents to the procedure. The patient was placed under general anesthesia. Initial unenhanced CT was performed in a PRONE position to localize the lateral exophytic left renal mass. Imaging was correlated with the prior CT and MRI. The patient was prepped with ChloraPrep in a sterile fashion, and a sterile drape was applied covering the operative field. A sterile gown and sterile gloves were used for the procedure. Under CT guidance, 2 ice force  percutaneous cryoablation probes were advanced into the superior  and inferior portions of the left renal mass, separated by approximately 1.2 cm. Probe positioning was confirmed by CT prior to cryoablation. Needles were frozen in initial position. Prior to ablation, a 66 gauge coaxial guide was advanced to the lateral margin of the left renal mass. Needle position reconfirmed with CT. A single 18 gauge core biopsy obtained. Sample was placed in formalin. Needle removed. Next, an 18 gauge 10 cm trocar needle was advanced lateral to the left renal mass and adjacent to the left colon. 120 cc of dilute contrast solution was instilled for hydro dissection to further separate the renal mass from the adjacent colon. Separation from the colon was approximately 1.5 cm. Cryoablation was performed through the the 2 ice force cryo ablation needles. Initial 10 minute cycle of cryoablation was performed. This was followed by a 6 minute thaw cycle. A second 10 minute cycle of cryoablation was then performed. During ablation, periodic CT imaging was performed to monitor ice ball formation and morphology. After active thaw and cautery, the cryoablation probes were removed. Post-procedural CT was performed. COMPLICATIONS: None immediate. FINDINGS: Imaging confirms 2 cryo ablation needles placed in the superior and inferior aspects of the exophytic left renal mass for cryo ablation. Biopsy needle also inserted for 18 gauge core biopsy as detailed above. Surveillance imaging during the treatment confirms adequate ice ball coverage of the lesion and separation from the left descending colon by the hydro dissection. IMPRESSION: CT guided left renal mass 18 gauge core biopsy and cryoablation as detailed above. The patient will be observed overnight. Initial follow-up will be performed in approximately 4 weeks. Electronically Signed   By: Jerilynn Mages.  Monique Gift M.D.   On: 07/06/2020 16:03    Labs:  CBC: Recent Labs    06/15/20 1341  06/22/20 0958 07/06/20 1015 07/07/20 0810  WBC 6.6 7.2 6.0 11.7*  HGB 16.0 16.4 16.3 14.8  HCT 47.8 48.4 49.1 44.6  PLT 121* 121* 132* 134*    COAGS: Recent Labs    06/22/20 0958  INR 1.1    BMP: Recent Labs    10/18/19 1019 10/19/19 0343 10/21/19 0333 10/22/19 0657 06/15/20 1341 06/22/20 0958 07/06/20 1015 07/07/20 0810  NA 142 141 138 138 141 140 143 138  K 3.7 3.6 4.1 4.0 4.2 4.6 3.8 4.4  CL 107 109 107 110 104 108 106 104  CO2 23 22 21* 20* 26 23 26 25   GLUCOSE 180* 137* 243* 188* 107* 136* 117* 202*  BUN 21 28* 31* 38* 29* 27* 29* 39*  CALCIUM 9.4 8.6* 8.9 8.9 9.4 9.4 9.1 8.6*  CREATININE 1.35* 1.59* 1.35* 1.35* 1.30* 1.61* 1.51* 1.94*  GFRNONAA 53* 44* 53* 53* 59* 46* 49* 37*  GFRAA >60 51* >60 >60  --   --   --   --     LIVER FUNCTION TESTS: No results for input(s): BILITOT, AST, ALT, ALKPHOS, PROT, ALBUMIN in the last 8760 hours.  Assessment and Plan:  1 month status post biopsy and cryoablation of a left renal mass.  Biopsy returned papillary renal cell carcinoma.  Patient is recovered over the last month very well.  No urinary tract symptoms or physical limitations.  Plan: Scheduled for surveillance imaging CT without and with contrast in 3 months.  Electronically Signed: Greggory Keen 07/26/2020, 4:13 PM   I spent a total of    25 Minutes in remote  clinical consultation, greater than 50% of which was counseling/coordinating care for this patient with a left papillary renal cell  carcinoma.    Visit type: Audio only (telephone). Audio (no video) only due to patient's lack of internet/smartphone capability. Alternative for in-person consultation at [facility], [address], [place], [place]. This visit type was conducted due to national recommendations for restrictions regarding the COVID-19 Pandemic (e.g. social distancing).  This format is felt to be most appropriate for this patient at this time.  All issues noted in this document  were discussed and addressed.

## 2020-08-02 DIAGNOSIS — Z8679 Personal history of other diseases of the circulatory system: Secondary | ICD-10-CM | POA: Diagnosis not present

## 2020-08-02 DIAGNOSIS — Z86010 Personal history of colonic polyps: Secondary | ICD-10-CM | POA: Diagnosis not present

## 2020-08-02 DIAGNOSIS — K219 Gastro-esophageal reflux disease without esophagitis: Secondary | ICD-10-CM | POA: Diagnosis not present

## 2020-08-02 DIAGNOSIS — D696 Thrombocytopenia, unspecified: Secondary | ICD-10-CM | POA: Diagnosis not present

## 2020-08-02 DIAGNOSIS — K200 Eosinophilic esophagitis: Secondary | ICD-10-CM | POA: Diagnosis not present

## 2020-08-02 DIAGNOSIS — K862 Cyst of pancreas: Secondary | ICD-10-CM | POA: Diagnosis not present

## 2020-08-02 DIAGNOSIS — Z9049 Acquired absence of other specified parts of digestive tract: Secondary | ICD-10-CM | POA: Diagnosis not present

## 2020-08-03 ENCOUNTER — Other Ambulatory Visit: Payer: Self-pay | Admitting: Gastroenterology

## 2020-08-03 DIAGNOSIS — D696 Thrombocytopenia, unspecified: Secondary | ICD-10-CM

## 2020-08-07 ENCOUNTER — Other Ambulatory Visit: Payer: Self-pay | Admitting: Cardiology

## 2020-08-19 ENCOUNTER — Ambulatory Visit
Admission: RE | Admit: 2020-08-19 | Discharge: 2020-08-19 | Disposition: A | Discharge status: Home / Self Care | Payer: Medicare PPO | Source: Ambulatory Visit | Attending: Gastroenterology | Admitting: Gastroenterology

## 2020-08-19 DIAGNOSIS — K8020 Calculus of gallbladder without cholecystitis without obstruction: Secondary | ICD-10-CM | POA: Diagnosis not present

## 2020-08-19 DIAGNOSIS — D696 Thrombocytopenia, unspecified: Secondary | ICD-10-CM | POA: Diagnosis not present

## 2020-08-19 DIAGNOSIS — N2889 Other specified disorders of kidney and ureter: Secondary | ICD-10-CM | POA: Diagnosis not present

## 2020-08-19 DIAGNOSIS — Z85528 Personal history of other malignant neoplasm of kidney: Secondary | ICD-10-CM | POA: Diagnosis not present

## 2020-08-20 ENCOUNTER — Other Ambulatory Visit: Payer: Self-pay | Admitting: Cardiology

## 2020-08-22 DIAGNOSIS — I129 Hypertensive chronic kidney disease with stage 1 through stage 4 chronic kidney disease, or unspecified chronic kidney disease: Secondary | ICD-10-CM | POA: Diagnosis not present

## 2020-08-23 ENCOUNTER — Other Ambulatory Visit: Payer: Self-pay | Admitting: Gastroenterology

## 2020-08-23 DIAGNOSIS — R9389 Abnormal findings on diagnostic imaging of other specified body structures: Secondary | ICD-10-CM

## 2020-08-25 DIAGNOSIS — N401 Enlarged prostate with lower urinary tract symptoms: Secondary | ICD-10-CM | POA: Diagnosis not present

## 2020-09-01 DIAGNOSIS — N401 Enlarged prostate with lower urinary tract symptoms: Secondary | ICD-10-CM | POA: Diagnosis not present

## 2020-09-01 DIAGNOSIS — R350 Frequency of micturition: Secondary | ICD-10-CM | POA: Diagnosis not present

## 2020-09-01 DIAGNOSIS — Z85528 Personal history of other malignant neoplasm of kidney: Secondary | ICD-10-CM | POA: Diagnosis not present

## 2020-09-06 ENCOUNTER — Ambulatory Visit
Admission: RE | Admit: 2020-09-06 | Discharge: 2020-09-06 | Disposition: A | Discharge status: Home / Self Care | Payer: Medicare PPO | Source: Ambulatory Visit | Attending: Gastroenterology | Admitting: Gastroenterology

## 2020-09-06 DIAGNOSIS — N281 Cyst of kidney, acquired: Secondary | ICD-10-CM | POA: Diagnosis not present

## 2020-09-06 DIAGNOSIS — K862 Cyst of pancreas: Secondary | ICD-10-CM | POA: Diagnosis not present

## 2020-09-06 DIAGNOSIS — K8080 Other cholelithiasis without obstruction: Secondary | ICD-10-CM | POA: Diagnosis not present

## 2020-09-06 DIAGNOSIS — K8020 Calculus of gallbladder without cholecystitis without obstruction: Secondary | ICD-10-CM | POA: Diagnosis not present

## 2020-09-06 DIAGNOSIS — R9389 Abnormal findings on diagnostic imaging of other specified body structures: Secondary | ICD-10-CM

## 2020-09-06 MED ORDER — GADOBENATE DIMEGLUMINE 529 MG/ML IV SOLN
20.0000 mL | Freq: Once | INTRAVENOUS | Status: AC | PRN
  Administered 2020-09-06: 20 mL via INTRAVENOUS

## 2020-09-08 DIAGNOSIS — E890 Postprocedural hypothyroidism: Secondary | ICD-10-CM | POA: Diagnosis not present

## 2020-09-15 ENCOUNTER — Other Ambulatory Visit: Payer: Self-pay | Admitting: Interventional Radiology

## 2020-09-15 DIAGNOSIS — N2889 Other specified disorders of kidney and ureter: Secondary | ICD-10-CM

## 2020-09-21 DIAGNOSIS — K200 Eosinophilic esophagitis: Secondary | ICD-10-CM | POA: Diagnosis not present

## 2020-09-21 DIAGNOSIS — B3781 Candidal esophagitis: Secondary | ICD-10-CM | POA: Diagnosis not present

## 2020-09-21 DIAGNOSIS — K449 Diaphragmatic hernia without obstruction or gangrene: Secondary | ICD-10-CM | POA: Diagnosis not present

## 2020-09-21 DIAGNOSIS — K317 Polyp of stomach and duodenum: Secondary | ICD-10-CM | POA: Diagnosis not present

## 2020-09-21 DIAGNOSIS — R12 Heartburn: Secondary | ICD-10-CM | POA: Diagnosis not present

## 2020-09-21 DIAGNOSIS — K222 Esophageal obstruction: Secondary | ICD-10-CM | POA: Diagnosis not present

## 2020-09-21 DIAGNOSIS — K2930 Chronic superficial gastritis without bleeding: Secondary | ICD-10-CM | POA: Diagnosis not present

## 2020-09-26 DIAGNOSIS — K2930 Chronic superficial gastritis without bleeding: Secondary | ICD-10-CM | POA: Diagnosis not present

## 2020-09-26 DIAGNOSIS — B3781 Candidal esophagitis: Secondary | ICD-10-CM | POA: Diagnosis not present

## 2020-09-26 DIAGNOSIS — K200 Eosinophilic esophagitis: Secondary | ICD-10-CM | POA: Diagnosis not present

## 2020-09-30 DIAGNOSIS — K8020 Calculus of gallbladder without cholecystitis without obstruction: Secondary | ICD-10-CM | POA: Diagnosis not present

## 2020-10-11 DIAGNOSIS — M810 Age-related osteoporosis without current pathological fracture: Secondary | ICD-10-CM | POA: Diagnosis not present

## 2020-10-11 DIAGNOSIS — E890 Postprocedural hypothyroidism: Secondary | ICD-10-CM | POA: Diagnosis not present

## 2020-10-11 DIAGNOSIS — Z85850 Personal history of malignant neoplasm of thyroid: Secondary | ICD-10-CM | POA: Diagnosis not present

## 2020-10-11 DIAGNOSIS — Z8781 Personal history of (healed) traumatic fracture: Secondary | ICD-10-CM | POA: Diagnosis not present

## 2020-10-11 DIAGNOSIS — E1122 Type 2 diabetes mellitus with diabetic chronic kidney disease: Secondary | ICD-10-CM | POA: Diagnosis not present

## 2020-10-11 DIAGNOSIS — Z7984 Long term (current) use of oral hypoglycemic drugs: Secondary | ICD-10-CM | POA: Diagnosis not present

## 2020-10-11 DIAGNOSIS — Z794 Long term (current) use of insulin: Secondary | ICD-10-CM | POA: Diagnosis not present

## 2020-10-18 DIAGNOSIS — H40023 Open angle with borderline findings, high risk, bilateral: Secondary | ICD-10-CM | POA: Diagnosis not present

## 2020-11-01 ENCOUNTER — Ambulatory Visit
Admission: RE | Admit: 2020-11-01 | Discharge: 2020-11-01 | Disposition: A | Discharge status: Home / Self Care | Payer: Medicare PPO | Source: Ambulatory Visit | Attending: Interventional Radiology | Admitting: Interventional Radiology

## 2020-11-01 ENCOUNTER — Encounter: Payer: Self-pay | Admitting: *Deleted

## 2020-11-01 DIAGNOSIS — N2889 Other specified disorders of kidney and ureter: Secondary | ICD-10-CM | POA: Diagnosis not present

## 2020-11-01 DIAGNOSIS — Z9889 Other specified postprocedural states: Secondary | ICD-10-CM | POA: Diagnosis not present

## 2020-11-01 HISTORY — PX: IR RADIOLOGIST EVAL & MGMT: IMG5224

## 2020-11-01 NOTE — Progress Notes (Signed)
Patient ID: Nathan Medina, male   DOB: 1950-09-23, 70 y.o.   MRN: [id number]       Chief Complaint:  Left renal cell carcinoma status post ablation  Referring Physician(s): Dr. Junious Silk  History of Present Illness: Nathan Medina is a 70 y.o. male with multiple comorbidities including hypertension, chronic kidney disease, diabetes, ischemic cardiomyopathy, remote coronary bypass 2019.  He was found to have a slowly enlarging left renal midpole exophytic lesion.  Lesion was compatible with a low-grade neoplasm such as papillary renal cell carcinoma or oncocytoma by CT.  He underwent successful image guided cryoablation and biopsy performed 07/06/2020 at [facility].  Overall he continues to do very well.  He remains asymptomatic.  No flank or abdominal pain.  No dysuria hematuria.  No recent illness or fever.  Stable weight and appetite.  He has had an interval MRI to evaluate gallstones as well as a known chronic pancreatic cyst.  Review of the MRI confirms treatment changes of the left kidney lesion laterally without any area of suspicious enhancement to suggest residual or recurrent disease at this point.  He is approximately 4 months posttreatment.  Past Medical History:  Diagnosis Date   Abnormal liver function test 05/23/2011   ALLERGIC RHINITIS    ANXIETY    Aortic atherosclerosis (HCC)    Arthritis    Ascending aorta dilation ([place]) 07/24/2018   Bilateral renal cysts    Cervical spondylolysis    Moderate   CHF (congestive heart failure) ([place]) 07/24/2018   Chronic kidney disease    CKD stage 3 per office visit note 08/08/17 on chart    COLONIC POLYPS, HX OF    Coronary artery disease    DEPRESSION    DIABETES MELLITUS, TYPE II    Diverticulitis    Epidermal cyst    Fatty liver    GERD    GOUT    History of inguinal hernia    HOH (hard of hearing)    elft ear   HYPERLIPIDEMIA    HYPERTENSION    IBS    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   Sigmoid colostomy due to  diverticular perforation, takedown and reversal September 2012   Ischemic cardiomyopathy 07/24/2018   Left renal mass 05/23/2011   Lumbar radicular pain 06/03/2011   Macular degeneration    right eye   Multinodular goiter    Myocardial infarction ([place])    silent MI in Pt's 38s   Pancreatic pseudocyst    Stable   Peripheral vascular disease ([place])    Peritonsillar abscess    Pulmonary nodule    Right upper lobe   Radial neck fracture    Right upper lobe pulmonary nodule 07/24/2018   Thyroid disease    Thyroid nodule    Bilateral   Vertigo     Past Surgical History:  Procedure Laterality Date   APPENDECTOMY  02/12/11   COLON SURGERY  08/11/10   sigmoid colectomy   COLON SURGERY  02/12/11   colostomy takedown   COLONOSCOPY     CORONARY ARTERY BYPASS GRAFT N/A 04/01/2018   Procedure: CORONARY ARTERY BYPASS GRAFTING (CABG) x Three, using left internal mammary artery and right leg greater saphenous vein harvested endoscopically;  Surgeon: Grace Isaac, MD;  Location: [place];  Service: Open Heart Surgery;  Laterality: N/A;   CYST REMOVAL TRUNK Right 02/20/2018   Procedure: epidermal cyst excision right lower back;  Surgeon: [name], MD;  [facility];  Service: General;  Laterality: Right;  HERNIA REPAIR     INSERTION OF MESH N/A 08/20/2012   Procedure: INSERTION OF MESH;  Surgeon: [name], MD;  [facility];  Service: General;  Laterality: N/A;   IR RADIOLOGIST EVAL & MGMT  03/23/2020   IR RADIOLOGIST EVAL & MGMT  07/26/2020   KNEE ARTHROSCOPY Right 10/18/2019   LEFT HEART CATH AND CORONARY ANGIOGRAPHY N/A 01/14/2018   Procedure: LEFT HEART CATH AND CORONARY ANGIOGRAPHY;  Surgeon: [name], MD;  Location: [facility];  Service: Cardiovascular;  Laterality: N/A;   LYSIS OF ADHESION  08/20/2012   Procedure: LYSIS OF ADHESION;  Surgeon: [name], MD;  [facility];  Service: General;;   QUADRICEPS TENDON REPAIR Right 10/20/2019   Procedure:  REPAIR RIGHT QUADRICEP TENDON;  Surgeon: Paralee Cancel, MD;  Location: [place];  Service: Orthopedics;  Laterality: Right;   RADIOLOGY WITH ANESTHESIA N/A 06/22/2020   Procedure: CT WITH ANESTHESIA  CRYOABLATION;  Surgeon: Greggory Keen, MD;  [facility];  Service: Anesthesiology;  Laterality: N/A;   RADIOLOGY WITH ANESTHESIA N/A 07/06/2020   Procedure: CT WITH ANESTHESIA;  Surgeon: Greggory Keen, MD;  [facility];  Service: Anesthesiology;  Laterality: N/A;   SEPTOPLASTY  age 89   TEE WITHOUT CARDIOVERSION N/A 04/01/2018   Procedure: TRANSESOPHAGEAL ECHOCARDIOGRAM (TEE);  Surgeon: Grace Isaac, MD;  Location: [place];  Service: Open Heart Surgery;  Laterality: N/A;   THYROIDECTOMY N/A 10/17/2017   Procedure: TOTAL THYROIDECTOMY;  Surgeon: [name], MD;  [facility];  Service: General;  Laterality: N/A;   TONSILLECTOMY Right 08/01/2016   Procedure: INCISION AND DRAINAGE RIGHT PERI-TONSILLAR ABSCESS;  Surgeon: [name], MD;  [facility];  Service: ENT;  Laterality: Right;   ULTRASOUND GUIDANCE FOR VASCULAR ACCESS  01/14/2018   Procedure: Ultrasound Guidance For Vascular Access;  Surgeon: [name], MD;  Location: [place] [facility];  Service: Cardiovascular;;   VENTRAL HERNIA REPAIR  08/20/2012   Procedure: HERNIA REPAIR VENTRAL ADULT;  Surgeon: [name], MD;  [facility];  Service: General;;    Allergies: Ciprofloxacin, Escitalopram oxalate, Quinapril hcl, and Sulfa antibiotics  Medications: Prior to Admission medications   Medication Sig Start Date End Date Taking? Authorizing Provider  alendronate (FOSAMAX) 70 MG tablet Take 70 mg by mouth once a week. Take with a full glass of water on an empty stomach.    [provider]  allopurinol (ZYLOPRIM) 300 MG tablet TAKE 1 TABLET BY MOUTH EVERY DAY Patient taking differently: Take 300 mg by mouth daily. 05/16/13   Biagio Borg, MD  ALPRAZolam Duanne Moron) 0.5 MG tablet Take 1 tablet (0.5 mg  total) by mouth daily as needed for sleep. 1/2 - 1 by mouth once daily as needed Patient taking differently: Take 0.25-0.5 mg by mouth at bedtime as needed for sleep. 04/03/12   Biagio Borg, MD  aspirin EC 81 MG tablet Take 81 mg by mouth every other day.     [provider]  atorvastatin (LIPITOR) 10 MG tablet Take 10 mg by mouth at bedtime.    [provider]  calcium carbonate (TUMS) 500 MG chewable tablet Chew 2 tablets (400 mg of elemental calcium total) by mouth 2 (two) times daily. Patient taking differently: Chew 2 tablets by mouth 2 (two) times daily as needed for indigestion. 10/18/17   [name], MD  carvedilol (COREG) 6.25 MG tablet Take 1 tablet (6.25 mg total) by mouth 2 (  two) times daily. 05/05/19   Patwardhan, Reynold Bowen, MD  Cholecalciferol (VITAMIN D-3) 25 MCG (1000 UT) CAPS Take 1,000 Units by mouth at bedtime.    [provider]  diphenhydrAMINE (BENADRYL) 25 MG tablet Take 25 mg by mouth at bedtime as needed for sleep.    [provider]  ENTRESTO 49-51 MG Take 1 tablet by mouth twice daily 08/22/20   Patwardhan, Reynold Bowen, MD  furosemide (LASIX) 40 MG tablet Take 40 mg by mouth daily.    [provider]  guaiFENesin (MUCINEX) 600 MG 12 hr tablet Take 1,200 mg by mouth 2 (two) times daily as needed for cough or to loosen phlegm.    [provider]  insulin glargine (LANTUS) 100 unit/mL SOPN Inject 44 Units into the skin at bedtime.    [provider]  levothyroxine (SYNTHROID) 175 MCG tablet Take 175 mcg by mouth daily before breakfast. 10/04/19   [provider]  Multiple Vitamins-Minerals (PRESERVISION AREDS 2 PO) Take 1 tablet by mouth 2 (two) times daily.    [provider]  omega-3 acid ethyl esters (LOVAZA) 1 g capsule Take 2 g by mouth 2 (two) times daily.     [provider]  pantoprazole (PROTONIX) 40 MG tablet Take 40 mg by mouth every morning. 09/29/19   [provider]   sildenafil (REVATIO) 20 MG tablet Take 100 mg by mouth daily as needed (ED).    [provider]  traMADol (ULTRAM) 50 MG tablet Take 50 mg by mouth every 6 (six) hours as needed for severe pain.    [provider]  triamcinolone cream (KENALOG) 0.1 % Apply 1 application topically 2 (two) times daily as needed (skin irritation).    [provider]  TRULICITY 4.33 IR/5.1OA SOPN Inject 0.75 mg into the skin once a week. Monday 02/26/19   [provider]  XARELTO 2.5 MG TABS tablet Take 1 tablet by mouth twice daily 08/08/20   Patwardhan, Reynold Bowen, MD     Family History  Problem Relation Age of Onset   Hypertension Paternal Aunt    Stroke Maternal Grandmother    Hypertension Maternal Grandmother     Social History   Socioeconomic History   Marital status: Single    Spouse name: Not on file   Number of children: 0   Years of education: Not on file   Highest education level: Not on file  Occupational History   Not on file  Tobacco Use   Smoking status: Never   Smokeless tobacco: Never  Vaping Use   Vaping Use: Never used  Substance and Sexual Activity   Alcohol use: Yes    Comment: occ   Drug use: No   Sexual activity: Not on file  Other Topics Concern   Not on file  Social History Narrative   Not on file   Social Determinants of Health   Financial Resource Strain: Not on file  Food Insecurity: Not on file  Transportation Needs: Not on file  Physical Activity: Not on file  Stress: Not on file  Social Connections: Not on file     Review of Systems: A 12 point ROS discussed and pertinent positives are indicated in the HPI above.  All other systems are negative.  Review of Systems  Vital Signs: BP 135/80 (BP Location: Right Arm)   Pulse 61   SpO2 98%   Physical Exam Constitutional:      General: He is not in acute distress.  Appearance: He is not toxic-appearing.  Eyes:     General: No scleral icterus.     Conjunctiva/sclera: Conjunctivae normal.  Cardiovascular:     Rate and Rhythm: Normal rate and regular rhythm.     Heart sounds: No murmur heard. Pulmonary:     Effort: Pulmonary effort is normal.     Breath sounds: Normal breath sounds.  Abdominal:     General: Bowel sounds are normal.     Palpations: There is no mass.     Tenderness: There is no abdominal tenderness.  Skin:    General: Skin is warm and dry.     Coloration: Skin is not jaundiced.  Neurological:     General: No focal deficit present.     Mental Status: He is alert. Mental status is at baseline.  Psychiatric:        Mood and Affect: Mood normal.        Imaging: No results found.  Labs:  CBC: Recent Labs    06/15/20 1341 06/22/20 0958 07/06/20 1015 07/07/20 0810  WBC 6.6 7.2 6.0 11.7*  HGB 16.0 16.4 16.3 14.8  HCT 47.8 48.4 49.1 44.6  PLT 121* 121* 132* 134*    COAGS: Recent Labs    06/22/20 0958  INR 1.1    BMP: Recent Labs    06/15/20 1341 06/22/20 0958 07/06/20 1015 07/07/20 0810  NA 141 140 143 138  K 4.2 4.6 3.8 4.4  CL 104 108 106 104  CO2 26 23 26 25   GLUCOSE 107* 136* 117* 202*  BUN 29* 27* 29* 39*  CALCIUM 9.4 9.4 9.1 8.6*  CREATININE 1.30* 1.61* 1.51* 1.94*  GFRNONAA 59* 46* 49* 37*    LIVER FUNCTION TESTS: No results for input(s): BILITOT, AST, ALT, ALKPHOS, PROT, ALBUMIN in the last 8760 hours.  TUMOR MARKERS: No results for input(s): AFPTM, CEA, CA199, CHROMGRNA in the last 8760 hours.  Assessment and Plan:  72-month status post left renal cell carcinoma CT-guided cryoablation.  Overall he is doing very well.  He remains asymptomatic.  No physical limitations.  Plan: Continue surveillance imaging with a CT scan in 6 months it was a long hospital.   Electronically Signed: Greggory Keen 11/01/2020, 11:50 AM   I spent a total of    40 Minutes in face to face in clinical consultation, greater than 50% of which was counseling/coordinating care for this patient  with left renal cell carcinoma status post ablation

## 2020-11-04 DIAGNOSIS — N189 Chronic kidney disease, unspecified: Secondary | ICD-10-CM | POA: Diagnosis not present

## 2020-11-04 DIAGNOSIS — M109 Gout, unspecified: Secondary | ICD-10-CM | POA: Diagnosis not present

## 2020-11-04 DIAGNOSIS — N2889 Other specified disorders of kidney and ureter: Secondary | ICD-10-CM | POA: Diagnosis not present

## 2020-11-04 DIAGNOSIS — I2510 Atherosclerotic heart disease of native coronary artery without angina pectoris: Secondary | ICD-10-CM | POA: Diagnosis not present

## 2020-11-04 DIAGNOSIS — R809 Proteinuria, unspecified: Secondary | ICD-10-CM | POA: Diagnosis not present

## 2020-11-04 DIAGNOSIS — E1122 Type 2 diabetes mellitus with diabetic chronic kidney disease: Secondary | ICD-10-CM | POA: Diagnosis not present

## 2020-11-04 DIAGNOSIS — I129 Hypertensive chronic kidney disease with stage 1 through stage 4 chronic kidney disease, or unspecified chronic kidney disease: Secondary | ICD-10-CM | POA: Diagnosis not present

## 2020-11-04 DIAGNOSIS — N183 Chronic kidney disease, stage 3 unspecified: Secondary | ICD-10-CM | POA: Diagnosis not present

## 2020-11-04 DIAGNOSIS — N2581 Secondary hyperparathyroidism of renal origin: Secondary | ICD-10-CM | POA: Diagnosis not present

## 2020-11-04 DIAGNOSIS — D631 Anemia in chronic kidney disease: Secondary | ICD-10-CM | POA: Diagnosis not present

## 2020-11-05 ENCOUNTER — Other Ambulatory Visit: Payer: Self-pay | Admitting: Cardiology

## 2020-11-09 DIAGNOSIS — D1801 Hemangioma of skin and subcutaneous tissue: Secondary | ICD-10-CM | POA: Diagnosis not present

## 2020-11-09 DIAGNOSIS — L814 Other melanin hyperpigmentation: Secondary | ICD-10-CM | POA: Diagnosis not present

## 2020-11-09 DIAGNOSIS — L821 Other seborrheic keratosis: Secondary | ICD-10-CM | POA: Diagnosis not present

## 2020-11-18 ENCOUNTER — Other Ambulatory Visit: Payer: Self-pay | Admitting: *Deleted

## 2020-11-18 DIAGNOSIS — R911 Solitary pulmonary nodule: Secondary | ICD-10-CM

## 2020-12-08 DIAGNOSIS — E1122 Type 2 diabetes mellitus with diabetic chronic kidney disease: Secondary | ICD-10-CM | POA: Diagnosis not present

## 2020-12-08 DIAGNOSIS — E032 Hypothyroidism due to medicaments and other exogenous substances: Secondary | ICD-10-CM | POA: Diagnosis not present

## 2020-12-08 DIAGNOSIS — I10 Essential (primary) hypertension: Secondary | ICD-10-CM | POA: Diagnosis not present

## 2020-12-08 DIAGNOSIS — E785 Hyperlipidemia, unspecified: Secondary | ICD-10-CM | POA: Diagnosis not present

## 2020-12-08 DIAGNOSIS — Z125 Encounter for screening for malignant neoplasm of prostate: Secondary | ICD-10-CM | POA: Diagnosis not present

## 2020-12-10 ENCOUNTER — Other Ambulatory Visit: Payer: Self-pay | Admitting: Cardiology

## 2020-12-13 DIAGNOSIS — R911 Solitary pulmonary nodule: Secondary | ICD-10-CM | POA: Diagnosis not present

## 2020-12-13 DIAGNOSIS — E1122 Type 2 diabetes mellitus with diabetic chronic kidney disease: Secondary | ICD-10-CM | POA: Diagnosis not present

## 2020-12-13 DIAGNOSIS — K219 Gastro-esophageal reflux disease without esophagitis: Secondary | ICD-10-CM | POA: Diagnosis not present

## 2020-12-13 DIAGNOSIS — N529 Male erectile dysfunction, unspecified: Secondary | ICD-10-CM | POA: Diagnosis not present

## 2020-12-13 DIAGNOSIS — Z0001 Encounter for general adult medical examination with abnormal findings: Secondary | ICD-10-CM | POA: Diagnosis not present

## 2020-12-13 DIAGNOSIS — N2581 Secondary hyperparathyroidism of renal origin: Secondary | ICD-10-CM | POA: Diagnosis not present

## 2020-12-13 DIAGNOSIS — I700 Atherosclerosis of aorta: Secondary | ICD-10-CM | POA: Diagnosis not present

## 2020-12-13 DIAGNOSIS — I10 Essential (primary) hypertension: Secondary | ICD-10-CM | POA: Diagnosis not present

## 2020-12-13 DIAGNOSIS — D696 Thrombocytopenia, unspecified: Secondary | ICD-10-CM | POA: Diagnosis not present

## 2020-12-13 DIAGNOSIS — N2889 Other specified disorders of kidney and ureter: Secondary | ICD-10-CM | POA: Diagnosis not present

## 2020-12-14 DIAGNOSIS — Z9049 Acquired absence of other specified parts of digestive tract: Secondary | ICD-10-CM | POA: Diagnosis not present

## 2020-12-14 DIAGNOSIS — K219 Gastro-esophageal reflux disease without esophagitis: Secondary | ICD-10-CM | POA: Diagnosis not present

## 2020-12-14 DIAGNOSIS — K862 Cyst of pancreas: Secondary | ICD-10-CM | POA: Diagnosis not present

## 2020-12-14 DIAGNOSIS — K200 Eosinophilic esophagitis: Secondary | ICD-10-CM | POA: Diagnosis not present

## 2020-12-14 DIAGNOSIS — D696 Thrombocytopenia, unspecified: Secondary | ICD-10-CM | POA: Diagnosis not present

## 2020-12-14 DIAGNOSIS — Z8679 Personal history of other diseases of the circulatory system: Secondary | ICD-10-CM | POA: Diagnosis not present

## 2020-12-14 DIAGNOSIS — Z86010 Personal history of colonic polyps: Secondary | ICD-10-CM | POA: Diagnosis not present

## 2021-01-05 ENCOUNTER — Other Ambulatory Visit: Payer: Medicare PPO

## 2021-01-05 DIAGNOSIS — H353132 Nonexudative age-related macular degeneration, bilateral, intermediate dry stage: Secondary | ICD-10-CM | POA: Diagnosis not present

## 2021-01-05 DIAGNOSIS — E119 Type 2 diabetes mellitus without complications: Secondary | ICD-10-CM | POA: Diagnosis not present

## 2021-01-05 DIAGNOSIS — H43813 Vitreous degeneration, bilateral: Secondary | ICD-10-CM | POA: Diagnosis not present

## 2021-01-05 DIAGNOSIS — H33311 Horseshoe tear of retina without detachment, right eye: Secondary | ICD-10-CM | POA: Diagnosis not present

## 2021-01-05 DIAGNOSIS — H25813 Combined forms of age-related cataract, bilateral: Secondary | ICD-10-CM | POA: Diagnosis not present

## 2021-01-09 ENCOUNTER — Other Ambulatory Visit: Payer: Self-pay

## 2021-01-09 ENCOUNTER — Ambulatory Visit
Admission: RE | Admit: 2021-01-09 | Discharge: 2021-01-09 | Disposition: A | Discharge status: Home / Self Care | Payer: Medicare PPO | Source: Ambulatory Visit | Attending: Thoracic Surgery (Cardiothoracic Vascular Surgery) | Admitting: Thoracic Surgery (Cardiothoracic Vascular Surgery)

## 2021-01-09 DIAGNOSIS — R911 Solitary pulmonary nodule: Secondary | ICD-10-CM | POA: Diagnosis not present

## 2021-01-09 DIAGNOSIS — I700 Atherosclerosis of aorta: Secondary | ICD-10-CM | POA: Diagnosis not present

## 2021-01-10 ENCOUNTER — Ambulatory Visit: Payer: Medicare PPO | Admitting: Physician Assistant

## 2021-01-10 VITALS — BP 128/85 | HR 56 | Resp 20 | Wt 202.0 lb

## 2021-01-10 DIAGNOSIS — R911 Solitary pulmonary nodule: Secondary | ICD-10-CM | POA: Diagnosis not present

## 2021-01-10 NOTE — Progress Notes (Signed)
PCP is [name], MD Referring Provider is [name], MD  Chief Complaint  Patient presents with   Follow-up    6 month f/u of nodule with CT Super D today    HPI: This is a 70 year old male known, previously to Dr. Servando Snare, from coronary artery bypass grafting x 3 in November 2019. He presents today for further surveillance of a relatively vague area of nodular ground-glass in the apical segment right upper lobe.  This was found incidentally by CT in March 2019.  With the retirement of Dr. Servando Snare, patient is now here to see Dr. Roxan Hockey for further surveillance. Patient has no specific complaint at this time.   Past Medical History:  Diagnosis Date   Abnormal liver function test 05/23/2011   ALLERGIC RHINITIS    ANXIETY    Aortic atherosclerosis (HCC)    Arthritis    Ascending aorta dilation ([place]) 07/24/2018   Bilateral renal cysts    Cervical spondylolysis    Moderate   CHF (congestive heart failure) ([place]) 07/24/2018   Chronic kidney disease    CKD stage 3 per office visit note 08/08/17 on chart    COLONIC POLYPS, HX OF    Coronary artery disease    DEPRESSION    DIABETES MELLITUS, TYPE II    Diverticulitis    Epidermal cyst    Fatty liver    GERD    GOUT    History of inguinal hernia    HOH (hard of hearing)    elft ear   HYPERLIPIDEMIA    HYPERTENSION    IBS    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, takedown and reversal September 2012   Ischemic cardiomyopathy 07/24/2018   Left renal mass 05/23/2011   Lumbar radicular pain 06/03/2011   Macular degeneration    right eye   Multinodular goiter    Myocardial infarction ([place])    silent MI in Pt's 62s   Pancreatic pseudocyst    Stable   Peripheral vascular disease ([place])    Peritonsillar abscess    Pulmonary nodule    Right upper lobe   Radial neck fracture    Right upper lobe pulmonary nodule 07/24/2018   Thyroid disease    Thyroid nodule    Bilateral    Vertigo     Past Surgical History:  Procedure Laterality Date   APPENDECTOMY  02/12/11   COLON SURGERY  08/11/10   sigmoid colectomy   COLON SURGERY  02/12/11   colostomy takedown   COLONOSCOPY     CORONARY ARTERY BYPASS GRAFT N/A 04/01/2018   Procedure: CORONARY ARTERY BYPASS GRAFTING (CABG) x Three, using left internal mammary artery and right leg greater saphenous vein harvested endoscopically;  Surgeon: Grace Isaac, MD;  Location: [place];  Service: Open Heart Surgery;  Laterality: N/A;   CYST REMOVAL TRUNK Right 02/20/2018   Procedure: epidermal cyst excision right lower back;  Surgeon: [name], MD;  [facility];  Service: General;  Laterality: Right;   HERNIA REPAIR     INSERTION OF MESH N/A 08/20/2012   Procedure: INSERTION OF MESH;  Surgeon: [name], MD;  [facility];  Service: General;  Laterality: N/A;   IR RADIOLOGIST EVAL & MGMT  03/23/2020   IR RADIOLOGIST EVAL & MGMT  07/26/2020   IR RADIOLOGIST EVAL & MGMT  11/01/2020   KNEE ARTHROSCOPY Right 10/18/2019   LEFT HEART CATH AND CORONARY ANGIOGRAPHY N/A 01/14/2018  Procedure: LEFT HEART CATH AND CORONARY ANGIOGRAPHY;  Surgeon: [name], MD;  Location: [place] [facility];  Service: Cardiovascular;  Laterality: N/A;   LYSIS OF ADHESION  08/20/2012   Procedure: LYSIS OF ADHESION;  Surgeon: [name], MD;  [facility];  Service: General;;   QUADRICEPS TENDON REPAIR Right 10/20/2019   Procedure: REPAIR RIGHT QUADRICEP TENDON;  Surgeon: Paralee Cancel, MD;  Location: [place];  Service: Orthopedics;  Laterality: Right;   RADIOLOGY WITH ANESTHESIA N/A 06/22/2020   Procedure: CT WITH ANESTHESIA  CRYOABLATION;  Surgeon: Greggory Keen, MD;  [facility];  Service: Anesthesiology;  Laterality: N/A;   RADIOLOGY WITH ANESTHESIA N/A 07/06/2020   Procedure: CT WITH ANESTHESIA;  Surgeon: Greggory Keen, MD;  [facility];  Service: Anesthesiology;  Laterality: N/A;   SEPTOPLASTY  age 81   TEE WITHOUT  CARDIOVERSION N/A 04/01/2018   Procedure: TRANSESOPHAGEAL ECHOCARDIOGRAM (TEE);  Surgeon: Grace Isaac, MD;  Location: [place];  Service: Open Heart Surgery;  Laterality: N/A;   THYROIDECTOMY N/A 10/17/2017   Procedure: TOTAL THYROIDECTOMY;  Surgeon: [name], MD;  [facility];  Service: General;  Laterality: N/A;   TONSILLECTOMY Right 08/01/2016   Procedure: INCISION AND DRAINAGE RIGHT PERI-TONSILLAR ABSCESS;  Surgeon: [name], MD;  [facility];  Service: ENT;  Laterality: Right;   ULTRASOUND GUIDANCE FOR VASCULAR ACCESS  01/14/2018   Procedure: Ultrasound Guidance For Vascular Access;  Surgeon: [name], MD;  Location: [facility];  Service: Cardiovascular;;   VENTRAL HERNIA REPAIR  08/20/2012   Procedure: HERNIA REPAIR VENTRAL ADULT;  Surgeon: [name], MD;  [facility];  Service: General;;    Family History  Problem Relation Age of Onset   Hypertension Paternal Aunt    Stroke Maternal Grandmother    Hypertension Maternal Grandmother     Social History Social History   Tobacco Use   Smoking status: Never   Smokeless tobacco: Never  Vaping Use   Vaping Use: Never used  Substance Use Topics   Alcohol use: Yes    Comment: occ   Drug use: No    Current Outpatient Medications  Medication Sig Dispense Refill   alendronate (FOSAMAX) 70 MG tablet Take 70 mg by mouth once a week. Take with a full glass of water on an empty stomach.     allopurinol (ZYLOPRIM) 300 MG tablet TAKE 1 TABLET BY MOUTH EVERY DAY (Patient taking differently: Take 300 mg by mouth daily.) 90 tablet 0   ALPRAZolam (XANAX) 0.5 MG tablet Take 1 tablet (0.5 mg total) by mouth daily as needed for sleep. 1/2 - 1 by mouth once daily as needed (Patient taking differently: Take 0.25-0.5 mg by mouth at bedtime as needed for sleep.) 90 tablet 2   aspirin EC 81 MG tablet Take 81 mg by mouth every other day.      atorvastatin (LIPITOR) 10 MG tablet Take 10 mg by mouth at  bedtime.     calcium carbonate (TUMS) 500 MG chewable tablet Chew 2 tablets (400 mg of elemental calcium total) by mouth 2 (two) times daily. (Patient taking differently: Chew 2 tablets by mouth 2 (two) times daily as needed for indigestion.) 90 tablet 1   carvedilol (COREG) 6.25 MG tablet Take 1 tablet (6.25 mg total) by mouth 2 (two) times daily. 180 tablet 3   Cholecalciferol (VITAMIN D-3) 25 MCG (1000 UT) CAPS Take 1,000 Units by mouth at bedtime.  diphenhydrAMINE (BENADRYL) 25 MG tablet Take 25 mg by mouth at bedtime as needed for sleep.     ENTRESTO 49-51 MG Take 1 tablet by mouth twice daily 180 tablet 0   furosemide (LASIX) 40 MG tablet Take 40 mg by mouth daily.     guaiFENesin (MUCINEX) 600 MG 12 hr tablet Take 1,200 mg by mouth 2 (two) times daily as needed for cough or to loosen phlegm.     insulin glargine (LANTUS) 100 unit/mL SOPN Inject 44 Units into the skin at bedtime.     levothyroxine (SYNTHROID) 175 MCG tablet Take 175 mcg by mouth daily before breakfast.     Multiple Vitamins-Minerals (PRESERVISION AREDS 2 PO) Take 1 tablet by mouth 2 (two) times daily.     omega-3 acid ethyl esters (LOVAZA) 1 g capsule Take 2 g by mouth 2 (two) times daily.      pantoprazole (PROTONIX) 40 MG tablet Take 40 mg by mouth every morning.     sildenafil (REVATIO) 20 MG tablet Take 100 mg by mouth daily as needed (ED).     traMADol (ULTRAM) 50 MG tablet Take 50 mg by mouth every 6 (six) hours as needed for severe pain.     triamcinolone cream (KENALOG) 0.1 % Apply 1 application topically 2 (two) times daily as needed (skin irritation).     TRULICITY [id number] SOPN Inject 0.75 mg into the skin once a week. Monday     XARELTO 2.5 MG TABS tablet Take 1 tablet by mouth twice daily 180 tablet 0   No current facility-administered medications for this visit.    Allergies  Allergen Reactions   Ciprofloxacin Rash   Escitalopram Oxalate Itching   Quinapril Hcl Rash   Sulfa Antibiotics Swelling     Swelling in the ankles    Review of Systems  BP 128/85 (BP Location: Right Arm, Patient Position: Sitting)   Pulse (!) 56   Resp 20   Wt 202 lb (91.6 kg)   SpO2 98%   BMI 31.64 kg/m   Physical Exam: CV-RRR Pulmonary-Clear to auscultation bilaterally Extremities-No LE edema   Diagnostic Tests: Result  Narrative & Impression  CLINICAL DATA:  Pulmonary nodule.   EXAM: CT CHEST WITHOUT CONTRAST   TECHNIQUE: Multidetector CT imaging of the chest was performed using thin slice collimation for electromagnetic bronchoscopy planning purposes, without intravenous contrast.   COMPARISON:  05/05/2020, 09/10/2019 and 09/04/2018. MR abdomen 09/06/2020.   FINDINGS: Cardiovascular: Atherosclerotic calcification of the aorta and aortic valve. Ascending aorta measures 4.1 cm, stable. Heart is enlarged. No pericardial effusion.   Mediastinum/Nodes: Thyroidectomy. No pathologically enlarged mediastinal or axillary lymph nodes. Hilar regions are difficult to definitively evaluate without IV contrast but appear grossly unremarkable. Esophagus is grossly unremarkable.   Lungs/Pleura: Relatively vague area of nodular ground-glass in the apical segment right upper lobe is unchanged from the most recent prior examination, measuring 1.5 x 1.9 cm. When compared with more remote prior examinations, namely 08/06/2017, size has increased from 1.4 x 1.6 cm. No internal solid components. Mild scarring in the lingula. Lungs are otherwise clear. No pleural fluid. Airway is unremarkable.   Upper Abdomen: Liver margin is irregular. Gallstones. Right adrenal gland is unremarkable. There may be thickening of the lateral limb left adrenal gland, incompletely visualized. 5.7 cm fluid density mass off the right kidney, incompletely visualized. Visualized portion of the spleen is unremarkable. 2.8 x 3.1 cm exophytic low-attenuation lesion off the tail of the pancreas appears to have dependent  debris  and is incompletely visualized but appears similar to 05/05/2020 and MR abdomen 09/06/2020. When compared to remote prior examinations (MR abdomen 10/22/2009 and CT abdomen 09/22/2010), lesion has increased very minimally in size from 2.4 x 2.7 cm. Visualized portions of the pancreas, stomach and bowel are otherwise unremarkable.   Musculoskeletal: Degenerative changes in the spine. No worrisome lytic or sclerotic lesions.   IMPRESSION: 1. Apical segment right upper lobe nodular ground-glass lesion is unchanged from recent prior examinations but has increased slightly in size from remote prior examinations such as 08/06/2017. Low-grade adenocarcinoma remains a concern. Continued annual CT observation is recommended. 2. Ascending aortic aneurysm, stable. Recommend annual imaging followup by CTA or MRA. This recommendation follows 2010 ACCF/AHA/AATS/ACR/ASA/SCA/SCAI/SIR/STS/SVM Guidelines for the Diagnosis and Management of Patients with Thoracic Aortic Disease. Circulation. 2010; [id number]. Aortic aneurysm NOS (ICD10-I71.9). 3. Liver appears cirrhotic. 4. Cholelithiasis. 5. Exophytic lesion off the pancreatic tail, minimally enlarged from 2012 and therefore most likely a pseudocyst. 6.  Aortic atherosclerosis (ICD10-I70.0).      Impression and Plan: I discussed with patient the results of his CT super D of the chest(unchanged from the most recent examination but slightly increased since 2019 from 1.4 To 1.6 cm).  As discussed with Dr. Roxan Hockey, the patient was given the choice to biopsy this RUL nodular ground glass lesion or continue with yearly surveillance. Patient prefers to be followed with imaging and states " I have been cut on enough". Patient will return to see Dr. Roxan Hockey with a CT super D of the chest in one year.     Nani Skillern, PA-C [facility] [phone]

## 2021-01-20 DIAGNOSIS — E1151 Type 2 diabetes mellitus with diabetic peripheral angiopathy without gangrene: Secondary | ICD-10-CM | POA: Diagnosis not present

## 2021-01-20 DIAGNOSIS — Z7983 Long term (current) use of bisphosphonates: Secondary | ICD-10-CM | POA: Diagnosis not present

## 2021-01-20 DIAGNOSIS — Z882 Allergy status to sulfonamides status: Secondary | ICD-10-CM | POA: Diagnosis not present

## 2021-01-20 DIAGNOSIS — I252 Old myocardial infarction: Secondary | ICD-10-CM | POA: Diagnosis not present

## 2021-01-20 DIAGNOSIS — Z6831 Body mass index (BMI) 31.0-31.9, adult: Secondary | ICD-10-CM | POA: Diagnosis not present

## 2021-01-20 DIAGNOSIS — K219 Gastro-esophageal reflux disease without esophagitis: Secondary | ICD-10-CM | POA: Diagnosis not present

## 2021-01-20 DIAGNOSIS — M109 Gout, unspecified: Secondary | ICD-10-CM | POA: Diagnosis not present

## 2021-01-20 DIAGNOSIS — Z8249 Family history of ischemic heart disease and other diseases of the circulatory system: Secondary | ICD-10-CM | POA: Diagnosis not present

## 2021-01-20 DIAGNOSIS — Z79891 Long term (current) use of opiate analgesic: Secondary | ICD-10-CM | POA: Diagnosis not present

## 2021-01-20 DIAGNOSIS — E1143 Type 2 diabetes mellitus with diabetic autonomic (poly)neuropathy: Secondary | ICD-10-CM | POA: Diagnosis not present

## 2021-01-20 DIAGNOSIS — F325 Major depressive disorder, single episode, in full remission: Secondary | ICD-10-CM | POA: Diagnosis not present

## 2021-01-20 DIAGNOSIS — I25119 Atherosclerotic heart disease of native coronary artery with unspecified angina pectoris: Secondary | ICD-10-CM | POA: Diagnosis not present

## 2021-01-20 DIAGNOSIS — Z7901 Long term (current) use of anticoagulants: Secondary | ICD-10-CM | POA: Diagnosis not present

## 2021-01-20 DIAGNOSIS — M810 Age-related osteoporosis without current pathological fracture: Secondary | ICD-10-CM | POA: Diagnosis not present

## 2021-01-20 DIAGNOSIS — G8929 Other chronic pain: Secondary | ICD-10-CM | POA: Diagnosis not present

## 2021-01-20 DIAGNOSIS — Z79899 Other long term (current) drug therapy: Secondary | ICD-10-CM | POA: Diagnosis not present

## 2021-01-20 DIAGNOSIS — Z7982 Long term (current) use of aspirin: Secondary | ICD-10-CM | POA: Diagnosis not present

## 2021-01-20 DIAGNOSIS — Z791 Long term (current) use of non-steroidal anti-inflammatories (NSAID): Secondary | ICD-10-CM | POA: Diagnosis not present

## 2021-01-20 DIAGNOSIS — E1122 Type 2 diabetes mellitus with diabetic chronic kidney disease: Secondary | ICD-10-CM | POA: Diagnosis not present

## 2021-01-20 DIAGNOSIS — I130 Hypertensive heart and chronic kidney disease with heart failure and stage 1 through stage 4 chronic kidney disease, or unspecified chronic kidney disease: Secondary | ICD-10-CM | POA: Diagnosis not present

## 2021-01-20 DIAGNOSIS — Z794 Long term (current) use of insulin: Secondary | ICD-10-CM | POA: Diagnosis not present

## 2021-01-20 DIAGNOSIS — Z820 Family history of epilepsy and other diseases of the nervous system: Secondary | ICD-10-CM | POA: Diagnosis not present

## 2021-01-20 DIAGNOSIS — J309 Allergic rhinitis, unspecified: Secondary | ICD-10-CM | POA: Diagnosis not present

## 2021-01-20 DIAGNOSIS — F419 Anxiety disorder, unspecified: Secondary | ICD-10-CM | POA: Diagnosis not present

## 2021-01-20 DIAGNOSIS — E890 Postprocedural hypothyroidism: Secondary | ICD-10-CM | POA: Diagnosis not present

## 2021-01-20 DIAGNOSIS — H3530 Unspecified macular degeneration: Secondary | ICD-10-CM | POA: Diagnosis not present

## 2021-01-20 DIAGNOSIS — E261 Secondary hyperaldosteronism: Secondary | ICD-10-CM | POA: Diagnosis not present

## 2021-01-20 DIAGNOSIS — I509 Heart failure, unspecified: Secondary | ICD-10-CM | POA: Diagnosis not present

## 2021-01-27 ENCOUNTER — Ambulatory Visit: Payer: Medicare PPO

## 2021-01-27 ENCOUNTER — Other Ambulatory Visit: Payer: Self-pay

## 2021-01-27 DIAGNOSIS — I255 Ischemic cardiomyopathy: Secondary | ICD-10-CM

## 2021-02-01 ENCOUNTER — Other Ambulatory Visit: Payer: Self-pay | Admitting: Cardiology

## 2021-02-01 DIAGNOSIS — H25813 Combined forms of age-related cataract, bilateral: Secondary | ICD-10-CM | POA: Diagnosis not present

## 2021-02-01 DIAGNOSIS — Z794 Long term (current) use of insulin: Secondary | ICD-10-CM | POA: Diagnosis not present

## 2021-02-01 DIAGNOSIS — H353132 Nonexudative age-related macular degeneration, bilateral, intermediate dry stage: Secondary | ICD-10-CM | POA: Diagnosis not present

## 2021-02-01 DIAGNOSIS — H179 Unspecified corneal scar and opacity: Secondary | ICD-10-CM | POA: Diagnosis not present

## 2021-02-01 DIAGNOSIS — Z79899 Other long term (current) drug therapy: Secondary | ICD-10-CM | POA: Diagnosis not present

## 2021-02-01 DIAGNOSIS — E1136 Type 2 diabetes mellitus with diabetic cataract: Secondary | ICD-10-CM | POA: Diagnosis not present

## 2021-02-06 ENCOUNTER — Other Ambulatory Visit: Payer: Self-pay

## 2021-02-06 ENCOUNTER — Ambulatory Visit: Payer: Medicare PPO | Admitting: Cardiology

## 2021-02-06 ENCOUNTER — Encounter: Payer: Self-pay | Admitting: Cardiology

## 2021-02-06 VITALS — BP 133/81 | HR 57 | Temp 97.8°F | Resp 16 | Ht 67.0 in | Wt 205.0 lb

## 2021-02-06 DIAGNOSIS — I25810 Atherosclerosis of coronary artery bypass graft(s) without angina pectoris: Secondary | ICD-10-CM

## 2021-02-06 DIAGNOSIS — I255 Ischemic cardiomyopathy: Secondary | ICD-10-CM | POA: Diagnosis not present

## 2021-02-06 DIAGNOSIS — I10 Essential (primary) hypertension: Secondary | ICD-10-CM | POA: Diagnosis not present

## 2021-02-06 NOTE — Progress Notes (Signed)
Patient is here for follow up visit.  Subjective:   @Patient  ID: Nathan Medina, male    DOB: 1950/06/17, 70 y.o.   MRN: [id number]  Chief Complaint  Patient presents with   Coronary artery disease involving coronary bypass graft of    Cardiomyopathy   ascending aorta dilation   Follow-up    11 year    70 year old Caucasian male with hypertension, CKD stage III, and 2 diabetes mellitus, ischemic cardiomyopathy with recovered EF, coronary artery disease s/p CABGX3 (LIMA-LAD, SVG-dRCA, SVG-ramus) by Dr. Servando Snare on 04/01/2018, stable renal and lung nodules followed by specialists, s/p thyroidectomy.  Patient has been doing fairly well from cardiac standpoint. He denies chest pain, shortness of breath, palpitations, leg edema, orthopnea, PND, TIA/syncope.   Current Outpatient Medications on File Prior to Visit  Medication Sig Dispense Refill   alendronate (FOSAMAX) 70 MG tablet Take 70 mg by mouth once a week. Take with a full glass of water on an empty stomach.     allopurinol (ZYLOPRIM) 300 MG tablet TAKE 1 TABLET BY MOUTH EVERY DAY (Patient taking differently: Take 300 mg by mouth daily.) 90 tablet 0   ALPRAZolam (XANAX) 0.5 MG tablet Take 1 tablet (0.5 mg total) by mouth daily as needed for sleep. 1/2 - 1 by mouth once daily as needed (Patient taking differently: Take 0.25-0.5 mg by mouth at bedtime as needed for sleep.) 90 tablet 2   aspirin EC 81 MG tablet Take 81 mg by mouth every other day.      atorvastatin (LIPITOR) 10 MG tablet Take 10 mg by mouth at bedtime.     calcium carbonate (TUMS) 500 MG chewable tablet Chew 2 tablets (400 mg of elemental calcium total) by mouth 2 (two) times daily. (Patient taking differently: Chew 2 tablets by mouth 2 (two) times daily as needed for indigestion.) 90 tablet 1   carvedilol (COREG) 6.25 MG tablet Take 1 tablet (6.25 mg total) by mouth 2 (two) times daily. 180 tablet 3   Cholecalciferol (VITAMIN D-3) 25 MCG (1000 UT) CAPS Take 1,000  Units by mouth at bedtime.     diphenhydrAMINE (BENADRYL) 25 MG tablet Take 25 mg by mouth at bedtime as needed for sleep.     ENTRESTO 49-51 MG Take 1 tablet by mouth twice daily 180 tablet 0   furosemide (LASIX) 40 MG tablet Take 40 mg by mouth daily.     guaiFENesin (MUCINEX) 600 MG 12 hr tablet Take 1,200 mg by mouth 2 (two) times daily as needed for cough or to loosen phlegm.     insulin glargine (LANTUS) 100 unit/mL SOPN Inject 44 Units into the skin at bedtime.     levothyroxine (SYNTHROID) 175 MCG tablet Take 175 mcg by mouth daily before breakfast.     Multiple Vitamins-Minerals (PRESERVISION AREDS 2 PO) Take 1 tablet by mouth 2 (two) times daily.     omega-3 acid ethyl esters (LOVAZA) 1 g capsule Take 2 g by mouth 2 (two) times daily.      pantoprazole (PROTONIX) 40 MG tablet Take 40 mg by mouth every morning.     sildenafil (REVATIO) 20 MG tablet Take 100 mg by mouth daily as needed (ED).     traMADol (ULTRAM) 50 MG tablet Take 50 mg by mouth every 6 (six) hours as needed for severe pain.     triamcinolone cream (KENALOG) 0.1 % Apply 1 application topically 2 (two) times daily as needed (skin irritation).     TRULICITY 4.10  MG/0.5ML SOPN Inject 0.75 mg into the skin once a week. Monday     XARELTO 2.5 MG TABS tablet Take 1 tablet by mouth twice daily 180 tablet 0   No current facility-administered medications on file prior to visit.    Cardiovascular studies:  Echocardiogram 01/27/2021:  Study Quality: Technically difficult study.  Mildly depressed LV systolic function with visual EF 40-45%. Left  ventricle cavity is normal in size. Mild left ventricular hypertrophy.  Normal global wall motion. Abnormal septal wall motion due to  post-operative coronary artery bypass graft. Doppler evidence of grade I  (impaired) diastolic dysfunction, normal LAP.  Left atrial cavity is mildly dilated.  Mild (Grade I) mitral regurgitation.  The aortic root is dilated, Sinus of Valsalva 4.1cm.  Proximal ascending  aorta not well visualized.  Compared to study 01/18/2020 Aortic root was 4cm and now 4.1cm, otherwise  no significant change.   EKG 01/28/2020: Sinus rhythm 62 bpm Old anterolateral infarct Poor R wave progression  CT Chest w/o contrast 09/04/2018: 1. No change in a ground-glass nodule of the posterior right upper lobe measuring approximately 1.5 cm (series 3, image 31). 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 Recommend additional follow-up in 1 year and annually through 5 years of stability. 2. Partially imaged mass of the left kidney (series 2, image 178), previously characterized as suspicious for malignancy by CT. Cystic lesion of the pancreatic tail measuring 2.9 cm (series 2, image 24). 3.  Coronary artery disease.  Post Bypass TEE 04/01/2018: Tricuspid, Pulmonic, Mitral and Aortic valve unchanged. Anterior and anteroseptal wall motion improved, LVEF > 55% (CO > 6) with vasopressor support. No dissection noted after cannula removed.    Cath 01/14/2018: LM: Normal LAD: Ostial 100% occluded. 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 RPDA distally occluded. Good surgical revascularization targets seen. LVEDP: Normal Recommendation: Diabetic patient with reduced LVEF and high Syntax score and moderate LAD territory ischemia superimposed on infarct. Recommend CVTS evaluation for CABG  Arterial duplex 11/19/2017: Normal examination. No evidence of hemodynamically significant peripheral arterial disease.  CT chest 08/06/2017: - persistent right upper lobe nodule with increase in size compared to 2018 PET scan 08/26/2017: - very low metabolic activity in the right upper lobe nodule. SUV 1.3. No other significant  uptake  Labs: 10/22/2019: Glucose 188, BUN/Cr 38/1.35. EGFR 53. Na/K 138/4.0.  H/H 13/40. MCV 96. Platelets 125 HbA1C 7.1%  06/2018: Chol 112, TG 134, HDL 32, LDL 53   Review of Systems  Cardiovascular:  Negative for chest pain, dyspnea on exertion, leg swelling, palpitations and syncope.      Objective:    Vitals:   02/06/21 1029  BP: 133/81  Pulse: (!) 57  Resp: 16  Temp: 97.8 F (36.6 C)  SpO2: 95%       Physical Exam Vitals and nursing note reviewed.  Constitutional:      General: He is not in acute distress. Neck:     Vascular: No JVD.  Cardiovascular:     Rate and Rhythm: Normal rate and regular rhythm.     Heart sounds: Normal heart sounds. No murmur heard. Pulmonary:     Effort: Pulmonary effort is normal.     Breath sounds: Normal breath sounds. No wheezing or rales.  Assessment & Recommendations:   70 year old Caucasian male with hypertension, CKD stage III, and 2 diabetes mellitus, ischemic cardiomyopathy with recovered EF, coronary artery disease s/p CABGX3 (LIMA-LAD, SVG-dRCA, SVG-ramus) by Dr. Servando Snare on 04/01/2018, stable renal and lung nodules followed by specialists, s/p thyroidectomy, stable thrombocytopenia  Ischemic cardiomyopathy: Recovered EF 40-45% (12/2019) Continue Entresto to 49-51 mg bid, coreg 6.25 mg bid.  Continue lasix 40 mg.  CAD: S/p CABG. No angina symptoms. Continue aspirin to 81 mg daily, Xarelto 2.5 mg bid.  Dilated aortic root: Mild, stable Repeat echocardiogram in 1 year  Postop Afib: No recurrence. Will monitor.  Hyperlipidemia: Well controlled. TG have also improved. Continue lipitor 10 mg daily.   Thrombocytopenia: Mild, stable. No bleeding. Continue f/u w/PCP.   Management of DM, CKD III, renal and lung nodules as per PCP and other specialists.  F/u in 1 year  [name], MD [facility] Pager: [phone] Office: [phone] If no answer Cell [phone]

## 2021-03-08 ENCOUNTER — Other Ambulatory Visit: Payer: Self-pay | Admitting: Cardiology

## 2021-03-21 DIAGNOSIS — Z794 Long term (current) use of insulin: Secondary | ICD-10-CM | POA: Diagnosis not present

## 2021-03-21 DIAGNOSIS — H353132 Nonexudative age-related macular degeneration, bilateral, intermediate dry stage: Secondary | ICD-10-CM | POA: Diagnosis not present

## 2021-03-21 DIAGNOSIS — H25813 Combined forms of age-related cataract, bilateral: Secondary | ICD-10-CM | POA: Diagnosis not present

## 2021-03-21 DIAGNOSIS — E1136 Type 2 diabetes mellitus with diabetic cataract: Secondary | ICD-10-CM | POA: Diagnosis not present

## 2021-03-21 DIAGNOSIS — H179 Unspecified corneal scar and opacity: Secondary | ICD-10-CM | POA: Diagnosis not present

## 2021-03-21 DIAGNOSIS — Z79899 Other long term (current) drug therapy: Secondary | ICD-10-CM | POA: Diagnosis not present

## 2021-04-04 ENCOUNTER — Other Ambulatory Visit: Payer: Self-pay | Admitting: Interventional Radiology

## 2021-04-04 DIAGNOSIS — N2889 Other specified disorders of kidney and ureter: Secondary | ICD-10-CM

## 2021-04-06 DIAGNOSIS — I130 Hypertensive heart and chronic kidney disease with heart failure and stage 1 through stage 4 chronic kidney disease, or unspecified chronic kidney disease: Secondary | ICD-10-CM | POA: Diagnosis not present

## 2021-04-06 DIAGNOSIS — Z794 Long term (current) use of insulin: Secondary | ICD-10-CM | POA: Diagnosis not present

## 2021-04-06 DIAGNOSIS — N183 Chronic kidney disease, stage 3 unspecified: Secondary | ICD-10-CM | POA: Diagnosis not present

## 2021-04-06 DIAGNOSIS — E1122 Type 2 diabetes mellitus with diabetic chronic kidney disease: Secondary | ICD-10-CM | POA: Diagnosis not present

## 2021-04-06 DIAGNOSIS — H25812 Combined forms of age-related cataract, left eye: Secondary | ICD-10-CM | POA: Diagnosis not present

## 2021-04-06 DIAGNOSIS — I509 Heart failure, unspecified: Secondary | ICD-10-CM | POA: Diagnosis not present

## 2021-04-06 DIAGNOSIS — H25813 Combined forms of age-related cataract, bilateral: Secondary | ICD-10-CM | POA: Diagnosis not present

## 2021-04-07 DIAGNOSIS — E1151 Type 2 diabetes mellitus with diabetic peripheral angiopathy without gangrene: Secondary | ICD-10-CM | POA: Diagnosis not present

## 2021-04-07 DIAGNOSIS — H179 Unspecified corneal scar and opacity: Secondary | ICD-10-CM | POA: Diagnosis not present

## 2021-04-07 DIAGNOSIS — I130 Hypertensive heart and chronic kidney disease with heart failure and stage 1 through stage 4 chronic kidney disease, or unspecified chronic kidney disease: Secondary | ICD-10-CM | POA: Diagnosis not present

## 2021-04-07 DIAGNOSIS — Z961 Presence of intraocular lens: Secondary | ICD-10-CM | POA: Diagnosis not present

## 2021-04-07 DIAGNOSIS — Z48810 Encounter for surgical aftercare following surgery on the sense organs: Secondary | ICD-10-CM | POA: Diagnosis not present

## 2021-04-07 DIAGNOSIS — H25811 Combined forms of age-related cataract, right eye: Secondary | ICD-10-CM | POA: Diagnosis not present

## 2021-04-07 DIAGNOSIS — E1136 Type 2 diabetes mellitus with diabetic cataract: Secondary | ICD-10-CM | POA: Diagnosis not present

## 2021-04-07 DIAGNOSIS — E1122 Type 2 diabetes mellitus with diabetic chronic kidney disease: Secondary | ICD-10-CM | POA: Diagnosis not present

## 2021-04-07 DIAGNOSIS — H353132 Nonexudative age-related macular degeneration, bilateral, intermediate dry stage: Secondary | ICD-10-CM | POA: Diagnosis not present

## 2021-04-10 DIAGNOSIS — M810 Age-related osteoporosis without current pathological fracture: Secondary | ICD-10-CM | POA: Diagnosis not present

## 2021-04-10 DIAGNOSIS — E1122 Type 2 diabetes mellitus with diabetic chronic kidney disease: Secondary | ICD-10-CM | POA: Diagnosis not present

## 2021-04-10 DIAGNOSIS — E890 Postprocedural hypothyroidism: Secondary | ICD-10-CM | POA: Diagnosis not present

## 2021-04-10 DIAGNOSIS — Z85850 Personal history of malignant neoplasm of thyroid: Secondary | ICD-10-CM | POA: Diagnosis not present

## 2021-04-10 DIAGNOSIS — Z794 Long term (current) use of insulin: Secondary | ICD-10-CM | POA: Diagnosis not present

## 2021-04-20 DIAGNOSIS — R809 Proteinuria, unspecified: Secondary | ICD-10-CM | POA: Diagnosis not present

## 2021-04-20 DIAGNOSIS — N2581 Secondary hyperparathyroidism of renal origin: Secondary | ICD-10-CM | POA: Diagnosis not present

## 2021-04-20 DIAGNOSIS — N183 Chronic kidney disease, stage 3 unspecified: Secondary | ICD-10-CM | POA: Diagnosis not present

## 2021-04-20 DIAGNOSIS — I2510 Atherosclerotic heart disease of native coronary artery without angina pectoris: Secondary | ICD-10-CM | POA: Diagnosis not present

## 2021-04-20 DIAGNOSIS — N189 Chronic kidney disease, unspecified: Secondary | ICD-10-CM | POA: Diagnosis not present

## 2021-04-20 DIAGNOSIS — E1122 Type 2 diabetes mellitus with diabetic chronic kidney disease: Secondary | ICD-10-CM | POA: Diagnosis not present

## 2021-04-20 DIAGNOSIS — I129 Hypertensive chronic kidney disease with stage 1 through stage 4 chronic kidney disease, or unspecified chronic kidney disease: Secondary | ICD-10-CM | POA: Diagnosis not present

## 2021-04-20 DIAGNOSIS — D631 Anemia in chronic kidney disease: Secondary | ICD-10-CM | POA: Diagnosis not present

## 2021-04-20 DIAGNOSIS — N2889 Other specified disorders of kidney and ureter: Secondary | ICD-10-CM | POA: Diagnosis not present

## 2021-04-20 DIAGNOSIS — M109 Gout, unspecified: Secondary | ICD-10-CM | POA: Diagnosis not present

## 2021-04-21 ENCOUNTER — Encounter (HOSPITAL_COMMUNITY): Payer: Self-pay

## 2021-04-21 ENCOUNTER — Ambulatory Visit (HOSPITAL_COMMUNITY)
Admission: RE | Admit: 2021-04-21 | Discharge: 2021-04-21 | Disposition: A | Discharge status: Home / Self Care | Payer: Medicare PPO | Source: Ambulatory Visit | Attending: Interventional Radiology | Admitting: Interventional Radiology

## 2021-04-21 DIAGNOSIS — K7689 Other specified diseases of liver: Secondary | ICD-10-CM | POA: Diagnosis not present

## 2021-04-21 DIAGNOSIS — N2889 Other specified disorders of kidney and ureter: Secondary | ICD-10-CM | POA: Diagnosis not present

## 2021-04-21 DIAGNOSIS — K8020 Calculus of gallbladder without cholecystitis without obstruction: Secondary | ICD-10-CM | POA: Diagnosis not present

## 2021-04-21 DIAGNOSIS — K8689 Other specified diseases of pancreas: Secondary | ICD-10-CM | POA: Diagnosis not present

## 2021-04-21 MED ORDER — SODIUM CHLORIDE (PF) 0.9 % IJ SOLN
INTRAMUSCULAR | Status: AC
  Filled 2021-04-21: qty 50

## 2021-04-21 MED ORDER — IOHEXOL 350 MG/ML SOLN
80.0000 mL | Freq: Once | INTRAVENOUS | Status: AC | PRN
  Administered 2021-04-21: 80 mL via INTRAVENOUS

## 2021-04-26 ENCOUNTER — Telehealth: Payer: Medicare PPO

## 2021-04-27 DIAGNOSIS — K219 Gastro-esophageal reflux disease without esophagitis: Secondary | ICD-10-CM | POA: Diagnosis not present

## 2021-04-27 DIAGNOSIS — Z794 Long term (current) use of insulin: Secondary | ICD-10-CM | POA: Diagnosis not present

## 2021-04-27 DIAGNOSIS — E119 Type 2 diabetes mellitus without complications: Secondary | ICD-10-CM | POA: Diagnosis not present

## 2021-04-27 DIAGNOSIS — H25812 Combined forms of age-related cataract, left eye: Secondary | ICD-10-CM | POA: Diagnosis not present

## 2021-04-27 DIAGNOSIS — H25811 Combined forms of age-related cataract, right eye: Secondary | ICD-10-CM | POA: Diagnosis not present

## 2021-05-05 DIAGNOSIS — E890 Postprocedural hypothyroidism: Secondary | ICD-10-CM | POA: Diagnosis not present

## 2021-05-10 ENCOUNTER — Encounter: Payer: Self-pay | Admitting: *Deleted

## 2021-05-10 ENCOUNTER — Ambulatory Visit
Admission: RE | Admit: 2021-05-10 | Discharge: 2021-05-10 | Disposition: A | Discharge status: Home / Self Care | Payer: Medicare PPO | Source: Ambulatory Visit | Attending: Interventional Radiology | Admitting: Interventional Radiology

## 2021-05-10 ENCOUNTER — Other Ambulatory Visit: Payer: Self-pay

## 2021-05-10 DIAGNOSIS — C642 Malignant neoplasm of left kidney, except renal pelvis: Secondary | ICD-10-CM | POA: Diagnosis not present

## 2021-05-10 DIAGNOSIS — Z9889 Other specified postprocedural states: Secondary | ICD-10-CM | POA: Diagnosis not present

## 2021-05-10 DIAGNOSIS — N2889 Other specified disorders of kidney and ureter: Secondary | ICD-10-CM

## 2021-05-10 HISTORY — PX: IR RADIOLOGIST EVAL & MGMT: IMG5224

## 2021-05-10 NOTE — Progress Notes (Signed)
Patient ID: Nathan Medina, male   DOB: 05/19/1951, 70 y.o.   MRN: [id number]       Chief Complaint:  Left renal cell carcinoma status post cryoablation  Referring Physician(s): Dr. Junious Silk  History of Present Illness: Nathan Medina is a 70 y.o. male with chronic medical problems including hypertension, chronic kidney disease, diabetes, cardiomyopathy, status post coronary bypass.  He was found to have a enlarging left renal midpole exophytic lesion.  Lesion underwent successful biopsy and cryoablation performed 07/06/2020.  Pathology revealed papillary renal cell carcinoma.  Overall he continues to do well.  He remains asymptomatic.  No urinary tract symptoms.  No recent illness or fevers.  Interval surveillance CT on 04/21/2021 demonstrates stable ablation changes in the interpolar lateral anterior left kidney without enhancement.  No signs of residual or recurrent disease.  No new renal abnormality.  Other chronic CT findings are stable.  Past Medical History:  Diagnosis Date   Abnormal liver function test 05/23/2011   ALLERGIC RHINITIS    ANXIETY    Aortic atherosclerosis (HCC)    Arthritis    Ascending aorta dilation ([place]) 07/24/2018   Bilateral renal cysts    Cervical spondylolysis    Moderate   CHF (congestive heart failure) ([place]) 07/24/2018   Chronic kidney disease    CKD stage 3 per office visit note 08/08/17 on chart    COLONIC POLYPS, HX OF    Coronary artery disease    DEPRESSION    DIABETES MELLITUS, TYPE II    Diverticulitis    Epidermal cyst    Fatty liver    GERD    GOUT    History of inguinal hernia    HOH (hard of hearing)    elft ear   HYPERLIPIDEMIA    HYPERTENSION    IBS    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, takedown and reversal September 2012   Ischemic cardiomyopathy 07/24/2018   Left renal mass 05/23/2011   Lumbar radicular pain 06/03/2011   Macular degeneration    right eye   Multinodular goiter     Myocardial infarction ([place])    silent MI in Pt's 71s   Pancreatic pseudocyst    Stable   Peripheral vascular disease ([place])    Peritonsillar abscess    Pulmonary nodule    Right upper lobe   Radial neck fracture    Right upper lobe pulmonary nodule 07/24/2018   Thyroid disease    Thyroid nodule    Bilateral   Vertigo     Past Surgical History:  Procedure Laterality Date   APPENDECTOMY  02/12/11   COLON SURGERY  08/11/10   sigmoid colectomy   COLON SURGERY  02/12/11   colostomy takedown   COLONOSCOPY     CORONARY ARTERY BYPASS GRAFT N/A 04/01/2018   Procedure: CORONARY ARTERY BYPASS GRAFTING (CABG) x Three, using left internal mammary artery and right leg greater saphenous vein harvested endoscopically;  Surgeon: Grace Isaac, MD;  Location: [place];  Service: Open Heart Surgery;  Laterality: N/A;   CYST REMOVAL TRUNK Right 02/20/2018   Procedure: epidermal cyst excision right lower back;  Surgeon: [name], MD;  Location: [facility];  Service: General;  Laterality: Right;   HERNIA REPAIR     INSERTION OF MESH N/A 08/20/2012   Procedure: INSERTION OF MESH;  Surgeon: [name], MD;  Location: [facility];  Service: General;  Laterality: N/A;   IR RADIOLOGIST EVAL & MGMT  03/23/2020   IR RADIOLOGIST EVAL & MGMT  07/26/2020   IR RADIOLOGIST EVAL & MGMT  11/01/2020   KNEE ARTHROSCOPY Right 10/18/2019   LEFT HEART CATH AND CORONARY ANGIOGRAPHY N/A 01/14/2018   Procedure: LEFT HEART CATH AND CORONARY ANGIOGRAPHY;  Surgeon: [name], MD;  Location: [place] [facility];  Service: Cardiovascular;  Laterality: N/A;   LYSIS OF ADHESION  08/20/2012   Procedure: LYSIS OF ADHESION;  Surgeon: [name], MD;  Location: [facility];  Service: General;;   QUADRICEPS TENDON REPAIR Right 10/20/2019   Procedure: REPAIR RIGHT QUADRICEP TENDON;  Surgeon: Paralee Cancel, MD;  Location: [place];  Service: Orthopedics;  Laterality: Right;   RADIOLOGY WITH ANESTHESIA N/A 06/22/2020   Procedure: CT WITH  ANESTHESIA  CRYOABLATION;  Surgeon: Greggory Keen, MD;  Location: [facility];  Service: Anesthesiology;  Laterality: N/A;   RADIOLOGY WITH ANESTHESIA N/A 07/06/2020   Procedure: CT WITH ANESTHESIA;  Surgeon: Greggory Keen, MD;  Location: [facility];  Service: Anesthesiology;  Laterality: N/A;   SEPTOPLASTY  age 22   TEE WITHOUT CARDIOVERSION N/A 04/01/2018   Procedure: TRANSESOPHAGEAL ECHOCARDIOGRAM (TEE);  Surgeon: Grace Isaac, MD;  Location: [place];  Service: Open Heart Surgery;  Laterality: N/A;   THYROIDECTOMY N/A 10/17/2017   Procedure: TOTAL THYROIDECTOMY;  Surgeon: [name], MD;  Location[facility];  Service: General;  Laterality: N/A;   TONSILLECTOMY Right 08/01/2016   Procedure: INCISION AND DRAINAGE RIGHT PERI-TONSILLAR ABSCESS;  Surgeon: [name], MD;  Location[facility];  Service: ENT;  Laterality: Right;   ULTRASOUND GUIDANCE FOR VASCULAR ACCESS  01/14/2018   Procedure: Ultrasound Guidance For Vascular Access;  Surgeon: [name], MD;  Location: [place] [facility];  Service: Cardiovascular;;   VENTRAL HERNIA REPAIR  08/20/2012   Procedure: HERNIA REPAIR VENTRAL ADULT;  Surgeon: [name], MD;  [facility];  Service: General;;    Allergies: Ciprofloxacin, Escitalopram oxalate, Quinapril hcl, and Sulfa antibiotics  Medications: Prior to Admission medications   Medication Sig Start Date End Date Taking? Authorizing Provider  acetaminophen (TYLENOL) 500 MG tablet 1 tablet as needed    [provider]  alendronate (FOSAMAX) 70 MG tablet Take 70 mg by mouth once a week. Take with a full glass of water on an empty stomach.    [provider]  allopurinol (ZYLOPRIM) 300 MG tablet TAKE 1 TABLET BY MOUTH EVERY DAY Patient taking differently: Take 300 mg by mouth daily. 05/16/13   Biagio Borg, MD  ALPRAZolam Duanne Moron) 0.5 MG tablet Take 1 tablet (0.5 mg total) by mouth daily as needed for sleep. 1/2 - 1 by mouth once daily as needed Patient taking  differently: Take 0.25-0.5 mg by mouth at bedtime as needed for sleep. 04/03/12   Biagio Borg, MD  aspirin EC 81 MG tablet Take 81 mg by mouth every other day.     [provider]  aspirin-acetaminophen-caffeine (EXCEDRIN EXTRA STRENGTH) [phone] MG tablet 2 tablets as needed    [provider]  atorvastatin (LIPITOR) 10 MG tablet Take 10 mg by mouth at bedtime.    [provider]  calcium carbonate (TUMS) 500 MG chewable tablet Chew 2 tablets (400 mg of elemental calcium total) by mouth 2 (two) times daily. Patient taking differently: Chew 2 tablets by mouth 2 (two) times daily as needed for indigestion. 10/18/17   [name], MD  carvedilol (COREG) 6.25 MG tablet Take 1 tablet (6.25 mg total) by mouth  2 (two) times daily. 05/05/19   Patwardhan, Reynold Bowen, MD  Cholecalciferol (VITAMIN D-3) 25 MCG (1000 UT) CAPS Take 1,000 Units by mouth at bedtime.    [provider]  diphenhydrAMINE (BENADRYL) 25 MG tablet Take 25 mg by mouth at bedtime as needed for sleep.    [provider]  ENTRESTO 49-51 MG Take 1 tablet by mouth twice daily 03/09/21   Patwardhan, Reynold Bowen, MD  furosemide (LASIX) 40 MG tablet Take 40 mg by mouth daily.    [provider]  guaiFENesin (MUCINEX) 600 MG 12 hr tablet Take 1,200 mg by mouth 2 (two) times daily as needed for cough or to loosen phlegm.    [provider]  insulin glargine (LANTUS) 100 unit/mL SOPN Inject 44 Units into the skin at bedtime.    [provider]  levothyroxine (SYNTHROID) 175 MCG tablet Take 175 mcg by mouth daily before breakfast. 10/04/19   [provider]  Multiple Vitamins-Minerals (PRESERVISION AREDS 2 PO) Take 1 tablet by mouth 2 (two) times daily.    [provider]  omega-3 acid ethyl esters (LOVAZA) 1 g capsule Take 2 g by mouth 2 (two) times daily.     [provider]  pantoprazole (PROTONIX) 40 MG tablet Take 40 mg by mouth every morning.  09/29/19   [provider]  sildenafil (REVATIO) 20 MG tablet Take 100 mg by mouth daily as needed (ED).    [provider]  traMADol (ULTRAM) 50 MG tablet Take 50 mg by mouth every 6 (six) hours as needed for severe pain.    [provider]  triamcinolone cream (KENALOG) 0.1 % Apply 1 application topically 2 (two) times daily as needed (skin irritation).    [provider]  TRULICITY 5.46 EV/0.3JK SOPN Inject 0.75 mg into the skin once a week. Monday 02/26/19   [provider]  XARELTO 2.5 MG TABS tablet Take 1 tablet by mouth twice daily 02/02/21   Patwardhan, Reynold Bowen, MD     Family History  Problem Relation Age of Onset   Hypertension Paternal Aunt    Stroke Maternal Grandmother    Hypertension Maternal Grandmother     Social History   Socioeconomic History   Marital status: Single    Spouse name: Not on file   Number of children: 0   Years of education: Not on file   Highest education level: Not on file  Occupational History   Not on file  Tobacco Use   Smoking status: Never   Smokeless tobacco: Never  Vaping Use   Vaping Use: Never used  Substance and Sexual Activity   Alcohol use: Yes    Comment: occ   Drug use: No   Sexual activity: Not on file  Other Topics Concern   Not on file  Social History Narrative   Not on file   Social Determinants of Health   Financial Resource Strain: Not on file  Food Insecurity: Not on file  Transportation Needs: Not on file  Physical Activity: Not on file  Stress: Not on file  Social Connections: Not on file    ECOG Status: 0 - Asymptomatic  Review of Systems  Review of Systems: A 12 point ROS discussed and pertinent positives are indicated in the HPI above.  All other systems are negative.  Physical Exam No direct physical exam was performed   Telephone health visit only today Vital Signs: There were no vitals taken for this visit.  Imaging: CT  ABDOMEN W WO  CONTRAST  Result Date: 04/22/2021 CLINICAL DATA:  A 71 year old male presents for follow-up following cryoablation of a LEFT renal lesion. EXAM: CT ABDOMEN WITHOUT AND WITH CONTRAST TECHNIQUE: Multidetector CT imaging of the abdomen was performed following the standard protocol before and following the bolus administration of intravenous contrast. CONTRAST:  85mL OMNIPAQUE IOHEXOL 350 MG/ML SOLN COMPARISON:  February 29, 2020.  September 06, 2020. FINDINGS: Lower chest: Lung bases are clear without signs of effusion or consolidative process. Heart size remains enlarged. Hepatobiliary: Mildly lobular hepatic contours with mild fissural widening. No focal, suspicious hepatic lesion. Cholelithiasis. Portal vein is patent. Pancreas: Mild pancreatic atrophy. Lesion in the pancreatic tail measuring 3.1 x 2.6 cm with thin rim of enhancement, no signs of mural nodularity and no change from previous imaging. Spleen: Normal size and contour. Adrenals/Urinary Tract: Adrenal glands are normal. Post ablation changes in the interpolar lateral and slightly anterior LEFT kidney. Central "tumor mass post ablation measuring 2.4 x 2.3 cm, significantly reduced in size as compared to 2.9 x 2.9 cm on the prior study. Baseline density of 3 Hounsfield units, not showing nodular or crescentic enhancement or other signs of enhancement on postcontrast imaging. Stable lower pole cyst on the LEFT. Also small upper pole cyst on the LEFT. Renal vein is patent. RIGHT kidney with moderate size cyst extending from the interpolar aspect. No signs of hydronephrosis or perinephric stranding on either side. No nephrolithiasis. Stomach/Bowel: No acute gastrointestinal findings. Duodenal diverticuli beyond the ampullary level as before. No acute gastrointestinal findings. Vascular/Lymphatic: Aortic atherosclerosis. No sign of aneurysm. Smooth contour of the IVC. There is no gastrohepatic or hepatoduodenal ligament lymphadenopathy. No retroperitoneal or  mesenteric lymphadenopathy. No pelvic sidewall lymphadenopathy. Other: No ascites. Musculoskeletal: No acute bone finding. No destructive bone process. Spinal degenerative changes. IMPRESSION: Post LEFT renal ablation without signs of disease recurrence or residual disease, decreasing size of central portion of the ablation zone as expected. Lesion in the pancreatic tail with thin rim of enhancement, no internal nodularity and no substantial change since imaging as far back as 2013. This favors a benign or indolent process. Suggest attention on follow-up. Cholelithiasis. Mildly lobular hepatic contours with mild fissural widening. Correlate with any clinical or laboratory evidence of liver disease. Aortic atherosclerosis. Electronically Signed   By: Zetta Bills M.D.   On: 04/22/2021 11:43    Labs:  CBC: Recent Labs    06/15/20 1341 06/22/20 0958 07/06/20 1015 07/07/20 0810  WBC 6.6 7.2 6.0 11.7*  HGB 16.0 16.4 16.3 14.8  HCT 47.8 48.4 49.1 44.6  PLT 121* 121* 132* 134*    COAGS: Recent Labs    06/22/20 0958  INR 1.1    BMP: Recent Labs    06/15/20 1341 06/22/20 0958 07/06/20 1015 07/07/20 0810  NA 141 140 143 138  K 4.2 4.6 3.8 4.4  CL 104 108 106 104  CO2 26 23 26 25   GLUCOSE 107* 136* 117* 202*  BUN 29* 27* 29* 39*  CALCIUM 9.4 9.4 9.1 8.6*  CREATININE 1.30* 1.61* 1.51* 1.94*  GFRNONAA 59* 46* 49* 37*    LIVER FUNCTION TESTS: No results for input(s): BILITOT, AST, ALT, ALKPHOS, PROT, ALBUMIN in the last 8760 hours.    Assessment and Plan:  82-month status post biopsy and cryoablation of a papillary left renal cell carcinoma.  Surveillance imaging demonstrates a stable ablation defect without signs of residual recurrent disease.  He remains asymptomatic.  No urinary tract symptoms.  No physical limitations.  Plan: Continue surveillance imaging with CT in 12 months at [facility].     Electronically Signed: Greggory Keen 05/10/2021, 8:51 AM   I  spent a total of    25 Minutes in remote  clinical consultation, greater than 50% of which was counseling/coordinating care for this patient with renal cell carcinoma.    Visit type: Audio only (telephone). Audio (no video) only due to patient's lack of internet/smartphone capability. Alternative for in-person consultation at [facility], [address], [place], [place]. This visit type was conducted due to national recommendations for restrictions regarding the COVID-19 Pandemic (e.g. social distancing).  This format is felt to be most appropriate for this patient at this time.  All issues noted in this document were discussed and addressed.

## 2021-05-23 DIAGNOSIS — I509 Heart failure, unspecified: Secondary | ICD-10-CM | POA: Diagnosis not present

## 2021-05-23 DIAGNOSIS — H9113 Presbycusis, bilateral: Secondary | ICD-10-CM | POA: Diagnosis not present

## 2021-05-23 DIAGNOSIS — K118 Other diseases of salivary glands: Secondary | ICD-10-CM | POA: Diagnosis not present

## 2021-05-26 DIAGNOSIS — H903 Sensorineural hearing loss, bilateral: Secondary | ICD-10-CM | POA: Diagnosis not present

## 2021-06-04 ENCOUNTER — Other Ambulatory Visit: Payer: Self-pay | Admitting: Cardiology

## 2021-06-21 DIAGNOSIS — B078 Other viral warts: Secondary | ICD-10-CM | POA: Diagnosis not present

## 2021-06-26 ENCOUNTER — Other Ambulatory Visit: Payer: Self-pay | Admitting: Cardiology

## 2021-06-27 DIAGNOSIS — E890 Postprocedural hypothyroidism: Secondary | ICD-10-CM | POA: Diagnosis not present

## 2021-07-08 DIAGNOSIS — K115 Sialolithiasis: Secondary | ICD-10-CM | POA: Diagnosis not present

## 2021-07-13 DIAGNOSIS — K1120 Sialoadenitis, unspecified: Secondary | ICD-10-CM | POA: Diagnosis not present

## 2021-07-20 DIAGNOSIS — H43813 Vitreous degeneration, bilateral: Secondary | ICD-10-CM | POA: Diagnosis not present

## 2021-07-20 DIAGNOSIS — E119 Type 2 diabetes mellitus without complications: Secondary | ICD-10-CM | POA: Diagnosis not present

## 2021-07-20 DIAGNOSIS — Z794 Long term (current) use of insulin: Secondary | ICD-10-CM | POA: Diagnosis not present

## 2021-07-20 DIAGNOSIS — Z7985 Long-term (current) use of injectable non-insulin antidiabetic drugs: Secondary | ICD-10-CM | POA: Diagnosis not present

## 2021-07-20 DIAGNOSIS — Z961 Presence of intraocular lens: Secondary | ICD-10-CM | POA: Diagnosis not present

## 2021-07-20 DIAGNOSIS — H353132 Nonexudative age-related macular degeneration, bilateral, intermediate dry stage: Secondary | ICD-10-CM | POA: Diagnosis not present

## 2021-07-20 DIAGNOSIS — H33311 Horseshoe tear of retina without detachment, right eye: Secondary | ICD-10-CM | POA: Diagnosis not present

## 2021-07-27 DIAGNOSIS — K1120 Sialoadenitis, unspecified: Secondary | ICD-10-CM | POA: Diagnosis not present

## 2021-08-08 DIAGNOSIS — W19XXXA Unspecified fall, initial encounter: Secondary | ICD-10-CM | POA: Diagnosis not present

## 2021-08-08 DIAGNOSIS — S80211A Abrasion, right knee, initial encounter: Secondary | ICD-10-CM | POA: Diagnosis not present

## 2021-08-22 DIAGNOSIS — E890 Postprocedural hypothyroidism: Secondary | ICD-10-CM | POA: Diagnosis not present

## 2021-09-16 ENCOUNTER — Other Ambulatory Visit: Payer: Self-pay | Admitting: Cardiology

## 2021-09-18 ENCOUNTER — Other Ambulatory Visit: Payer: Self-pay | Admitting: Gastroenterology

## 2021-09-18 DIAGNOSIS — Q6100 Congenital renal cyst, unspecified: Secondary | ICD-10-CM

## 2021-09-18 DIAGNOSIS — K862 Cyst of pancreas: Secondary | ICD-10-CM

## 2021-09-22 DIAGNOSIS — N401 Enlarged prostate with lower urinary tract symptoms: Secondary | ICD-10-CM | POA: Diagnosis not present

## 2021-09-26 ENCOUNTER — Other Ambulatory Visit: Payer: Self-pay | Admitting: Cardiology

## 2021-09-27 DIAGNOSIS — L719 Rosacea, unspecified: Secondary | ICD-10-CM | POA: Diagnosis not present

## 2021-09-27 DIAGNOSIS — L308 Other specified dermatitis: Secondary | ICD-10-CM | POA: Diagnosis not present

## 2021-09-29 DIAGNOSIS — R972 Elevated prostate specific antigen [PSA]: Secondary | ICD-10-CM | POA: Diagnosis not present

## 2021-09-29 DIAGNOSIS — D4102 Neoplasm of uncertain behavior of left kidney: Secondary | ICD-10-CM | POA: Diagnosis not present

## 2021-09-29 DIAGNOSIS — N400 Enlarged prostate without lower urinary tract symptoms: Secondary | ICD-10-CM | POA: Diagnosis not present

## 2021-09-30 ENCOUNTER — Ambulatory Visit
Admission: RE | Admit: 2021-09-30 | Discharge: 2021-09-30 | Disposition: A | Discharge status: Home / Self Care | Payer: Medicare PPO | Source: Ambulatory Visit | Attending: Gastroenterology | Admitting: Gastroenterology

## 2021-09-30 DIAGNOSIS — K862 Cyst of pancreas: Secondary | ICD-10-CM

## 2021-09-30 DIAGNOSIS — K8689 Other specified diseases of pancreas: Secondary | ICD-10-CM | POA: Diagnosis not present

## 2021-09-30 DIAGNOSIS — N281 Cyst of kidney, acquired: Secondary | ICD-10-CM | POA: Diagnosis not present

## 2021-09-30 DIAGNOSIS — Q6100 Congenital renal cyst, unspecified: Secondary | ICD-10-CM

## 2021-09-30 DIAGNOSIS — Z9889 Other specified postprocedural states: Secondary | ICD-10-CM | POA: Diagnosis not present

## 2021-09-30 DIAGNOSIS — K8020 Calculus of gallbladder without cholecystitis without obstruction: Secondary | ICD-10-CM | POA: Diagnosis not present

## 2021-09-30 MED ORDER — GADOBENATE DIMEGLUMINE 529 MG/ML IV SOLN
20.0000 mL | Freq: Once | INTRAVENOUS | Status: AC | PRN
  Administered 2021-09-30: 20 mL via INTRAVENOUS

## 2021-10-10 DIAGNOSIS — Z85850 Personal history of malignant neoplasm of thyroid: Secondary | ICD-10-CM | POA: Diagnosis not present

## 2021-10-10 DIAGNOSIS — Z794 Long term (current) use of insulin: Secondary | ICD-10-CM | POA: Diagnosis not present

## 2021-10-10 DIAGNOSIS — E890 Postprocedural hypothyroidism: Secondary | ICD-10-CM | POA: Diagnosis not present

## 2021-10-10 DIAGNOSIS — E1122 Type 2 diabetes mellitus with diabetic chronic kidney disease: Secondary | ICD-10-CM | POA: Diagnosis not present

## 2021-10-10 DIAGNOSIS — M810 Age-related osteoporosis without current pathological fracture: Secondary | ICD-10-CM | POA: Diagnosis not present

## 2021-10-31 DIAGNOSIS — N2889 Other specified disorders of kidney and ureter: Secondary | ICD-10-CM | POA: Diagnosis not present

## 2021-10-31 DIAGNOSIS — I2510 Atherosclerotic heart disease of native coronary artery without angina pectoris: Secondary | ICD-10-CM | POA: Diagnosis not present

## 2021-10-31 DIAGNOSIS — N183 Chronic kidney disease, stage 3 unspecified: Secondary | ICD-10-CM | POA: Diagnosis not present

## 2021-10-31 DIAGNOSIS — E1122 Type 2 diabetes mellitus with diabetic chronic kidney disease: Secondary | ICD-10-CM | POA: Diagnosis not present

## 2021-10-31 DIAGNOSIS — N2581 Secondary hyperparathyroidism of renal origin: Secondary | ICD-10-CM | POA: Diagnosis not present

## 2021-10-31 DIAGNOSIS — I129 Hypertensive chronic kidney disease with stage 1 through stage 4 chronic kidney disease, or unspecified chronic kidney disease: Secondary | ICD-10-CM | POA: Diagnosis not present

## 2021-10-31 DIAGNOSIS — D631 Anemia in chronic kidney disease: Secondary | ICD-10-CM | POA: Diagnosis not present

## 2021-10-31 DIAGNOSIS — M109 Gout, unspecified: Secondary | ICD-10-CM | POA: Diagnosis not present

## 2021-10-31 DIAGNOSIS — R809 Proteinuria, unspecified: Secondary | ICD-10-CM | POA: Diagnosis not present

## 2021-11-17 ENCOUNTER — Other Ambulatory Visit: Payer: Self-pay | Admitting: Thoracic Surgery (Cardiothoracic Vascular Surgery)

## 2021-11-17 DIAGNOSIS — R911 Solitary pulmonary nodule: Secondary | ICD-10-CM

## 2021-12-04 DIAGNOSIS — Z86010 Personal history of colonic polyps: Secondary | ICD-10-CM | POA: Diagnosis not present

## 2021-12-04 DIAGNOSIS — D696 Thrombocytopenia, unspecified: Secondary | ICD-10-CM | POA: Diagnosis not present

## 2021-12-04 DIAGNOSIS — K200 Eosinophilic esophagitis: Secondary | ICD-10-CM | POA: Diagnosis not present

## 2021-12-04 DIAGNOSIS — Z8679 Personal history of other diseases of the circulatory system: Secondary | ICD-10-CM | POA: Diagnosis not present

## 2021-12-04 DIAGNOSIS — K219 Gastro-esophageal reflux disease without esophagitis: Secondary | ICD-10-CM | POA: Diagnosis not present

## 2021-12-04 DIAGNOSIS — Z9049 Acquired absence of other specified parts of digestive tract: Secondary | ICD-10-CM | POA: Diagnosis not present

## 2021-12-04 DIAGNOSIS — K862 Cyst of pancreas: Secondary | ICD-10-CM | POA: Diagnosis not present

## 2021-12-11 ENCOUNTER — Other Ambulatory Visit: Payer: Self-pay | Admitting: Cardiology

## 2021-12-14 DIAGNOSIS — E1122 Type 2 diabetes mellitus with diabetic chronic kidney disease: Secondary | ICD-10-CM | POA: Diagnosis not present

## 2021-12-14 DIAGNOSIS — Z125 Encounter for screening for malignant neoplasm of prostate: Secondary | ICD-10-CM | POA: Diagnosis not present

## 2021-12-14 DIAGNOSIS — E785 Hyperlipidemia, unspecified: Secondary | ICD-10-CM | POA: Diagnosis not present

## 2021-12-14 DIAGNOSIS — E039 Hypothyroidism, unspecified: Secondary | ICD-10-CM | POA: Diagnosis not present

## 2021-12-14 DIAGNOSIS — I10 Essential (primary) hypertension: Secondary | ICD-10-CM | POA: Diagnosis not present

## 2021-12-19 ENCOUNTER — Other Ambulatory Visit: Payer: Self-pay | Admitting: Cardiology

## 2021-12-19 DIAGNOSIS — I5022 Chronic systolic (congestive) heart failure: Secondary | ICD-10-CM | POA: Diagnosis not present

## 2021-12-19 DIAGNOSIS — E1122 Type 2 diabetes mellitus with diabetic chronic kidney disease: Secondary | ICD-10-CM | POA: Diagnosis not present

## 2021-12-19 DIAGNOSIS — Z0000 Encounter for general adult medical examination without abnormal findings: Secondary | ICD-10-CM | POA: Diagnosis not present

## 2021-12-19 DIAGNOSIS — I2510 Atherosclerotic heart disease of native coronary artery without angina pectoris: Secondary | ICD-10-CM | POA: Diagnosis not present

## 2021-12-19 DIAGNOSIS — K862 Cyst of pancreas: Secondary | ICD-10-CM | POA: Diagnosis not present

## 2021-12-19 DIAGNOSIS — N1831 Chronic kidney disease, stage 3a: Secondary | ICD-10-CM | POA: Diagnosis not present

## 2021-12-19 DIAGNOSIS — N2581 Secondary hyperparathyroidism of renal origin: Secondary | ICD-10-CM | POA: Diagnosis not present

## 2021-12-19 DIAGNOSIS — R911 Solitary pulmonary nodule: Secondary | ICD-10-CM | POA: Diagnosis not present

## 2021-12-19 DIAGNOSIS — Z23 Encounter for immunization: Secondary | ICD-10-CM | POA: Diagnosis not present

## 2022-01-02 ENCOUNTER — Ambulatory Visit: Payer: Medicare PPO | Admitting: Podiatry

## 2022-01-02 DIAGNOSIS — M2041 Other hammer toe(s) (acquired), right foot: Secondary | ICD-10-CM

## 2022-01-02 DIAGNOSIS — L600 Ingrowing nail: Secondary | ICD-10-CM | POA: Diagnosis not present

## 2022-01-02 DIAGNOSIS — G6289 Other specified polyneuropathies: Secondary | ICD-10-CM | POA: Diagnosis not present

## 2022-01-02 DIAGNOSIS — L84 Corns and callosities: Secondary | ICD-10-CM

## 2022-01-02 DIAGNOSIS — Z794 Long term (current) use of insulin: Secondary | ICD-10-CM | POA: Diagnosis not present

## 2022-01-02 DIAGNOSIS — E119 Type 2 diabetes mellitus without complications: Secondary | ICD-10-CM | POA: Diagnosis not present

## 2022-01-02 NOTE — Progress Notes (Signed)
  Subjective:  Patient ID: Nathan Medina, male    DOB: Oct 16, 1950,  MRN: [id number]  Chief Complaint  Patient presents with   Foot Problem    Neuropathy to right foot- patient stated it is getting worst- patient taking allopurinol.    Ingrown Toenail    Ingrown toenail to right hallux- medial border    71 y.o. male presents with the above complaint. History confirmed with patient.  He is taking 100 mg gabapentin 2-3 times weekly for this and seems to be helpful, so far it seems to last him a couple of days between administration.  He has a painful ingrowing lateral right hallux nail border.  There is also a painful callus that is developing on the fifth toe  Objective:  Physical Exam: warm, good capillary refill, no trophic changes or ulcerative lesions, normal DP and PT pulses, and ingrowing nail right hallux lateral border, no paronychia, not deep just at the tip, painful porokeratosis tip of  Assessment:   1. Other polyneuropathy   2. Hammertoe of right foot   3. Ingrowing right great toenail   4. Callus of foot   5. Type 2 diabetes mellitus treated with insulin ([place])      Plan:  Patient was evaluated and treated and all questions answered.  Discussed treatment of ingrowing nails if this continues to worsen including partial permanent matricectomy.  Nail was debrided in a slant back fashion with a sharp nail nipper to tolerance.  All symptomatic hyperkeratoses were safely debrided with a sterile #15 blade to patient's level of comfort without incident. We discussed preventative and palliative care of these lesions including supportive and accommodative shoegear, padding, prefabricated and custom molded accommodative orthoses, use of a pumice stone and lotions/creams daily.  Regarding his polyneuropathy he is currently taking gabapentin and this seems to be helping.  We discussed up titration of this if it continues to worsen.  He will return to see [place] as needed if it does not  improve or worsens  Return if symptoms worsen or fail to improve.

## 2022-01-18 ENCOUNTER — Ambulatory Visit
Admission: RE | Admit: 2022-01-18 | Discharge: 2022-01-18 | Disposition: A | Discharge status: Home / Self Care | Payer: Medicare PPO | Source: Ambulatory Visit | Attending: Thoracic Surgery (Cardiothoracic Vascular Surgery) | Admitting: Thoracic Surgery (Cardiothoracic Vascular Surgery)

## 2022-01-18 DIAGNOSIS — R911 Solitary pulmonary nodule: Secondary | ICD-10-CM | POA: Diagnosis not present

## 2022-01-18 DIAGNOSIS — I700 Atherosclerosis of aorta: Secondary | ICD-10-CM | POA: Diagnosis not present

## 2022-01-23 ENCOUNTER — Encounter: Payer: Self-pay | Admitting: Thoracic Surgery (Cardiothoracic Vascular Surgery)

## 2022-01-23 ENCOUNTER — Ambulatory Visit: Payer: Medicare PPO | Admitting: Thoracic Surgery (Cardiothoracic Vascular Surgery)

## 2022-01-23 VITALS — BP 138/87 | HR 54 | Resp 20 | Ht 67.0 in | Wt 199.6 lb

## 2022-01-23 DIAGNOSIS — R911 Solitary pulmonary nodule: Secondary | ICD-10-CM | POA: Diagnosis not present

## 2022-01-23 NOTE — Progress Notes (Signed)
[address]       [place] [address]             [phone]     HPI: Nathan Medina returns for a scheduled follow-up visit  Nathan Medina is a 71 year old man with a past medical history significant for CAD, CABG x3, ischemic cardiomyopathy, hypertension, dyslipidemia, type 2 diabetes, renal cell carcinoma left kidney status post ablation, and a right upper lobe lung nodule.  He has been followed for a groundglass opacity in the right upper lobe that was first noted in 2019.  There has been minimal change from scan to scan but when looking back over multiple years there has been definite growth over time.  He was last seen in the office a year ago by Lars Pinks.  We discussed the possibility of biopsy at that time, but he opted for radiographic follow-up.  He has had a pancreatic mass.  He had an MRI that showed that was consistent with a benign pseudocyst.  That is being followed elsewhere. Past Medical History:  Diagnosis Date   Abnormal liver function test 05/23/2011   ALLERGIC RHINITIS    ANXIETY    Aortic atherosclerosis (HCC)    Arthritis    Ascending aorta dilation ([place]) 07/24/2018   Bilateral renal cysts    Cervical spondylolysis    Moderate   CHF (congestive heart failure) ([place]) 07/24/2018   Chronic kidney disease    CKD stage 3 per office visit note 08/08/17 on chart    COLONIC POLYPS, HX OF    Coronary artery disease    DEPRESSION    DIABETES MELLITUS, TYPE II    Diverticulitis    Epidermal cyst    Fatty liver    GERD    GOUT    History of inguinal hernia    HOH (hard of hearing)    elft ear   HYPERLIPIDEMIA    HYPERTENSION    IBS    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, takedown and reversal September 2012   Ischemic cardiomyopathy 07/24/2018   Left renal mass 05/23/2011   Lumbar radicular pain 06/03/2011   Macular degeneration    right eye   Multinodular goiter    Myocardial infarction  [facility])    silent MI in Pt's 30s   Pancreatic pseudocyst    Stable   Peripheral vascular disease ([place])    Peritonsillar abscess    Pulmonary nodule    Right upper lobe   Radial neck fracture    Right upper lobe pulmonary nodule 07/24/2018   Thyroid disease    Thyroid nodule    Bilateral   Vertigo     Current Outpatient Medications  Medication Sig Dispense Refill   acetaminophen (TYLENOL) 500 MG tablet 1 tablet as needed     alendronate (FOSAMAX) 70 MG tablet Take 70 mg by mouth once a week. Take with a full glass of water on an empty stomach.     allopurinol (ZYLOPRIM) 300 MG tablet Take 1 tablet by mouth daily.     ALPRAZolam (XANAX) 0.5 MG tablet Take 1 tablet (0.5 mg total) by mouth daily as needed for sleep. 1/2 - 1 by mouth once daily as needed (Patient taking differently: Take 0.25-0.5 mg by mouth at bedtime as needed for sleep.) 90 tablet 2   aspirin EC 81 MG tablet Take 81 mg by mouth every other day.      aspirin-acetaminophen-caffeine ([place])  250-250-65 MG tablet 2 tablets as needed     atorvastatin (LIPITOR) 10 MG tablet Take 10 mg by mouth at bedtime.     calcium carbonate (TUMS) 500 MG chewable tablet Chew 2 tablets (400 mg of elemental calcium total) by mouth 2 (two) times daily. (Patient taking differently: Chew 2 tablets by mouth 2 (two) times daily as needed for indigestion.) 90 tablet 1   carvedilol (COREG) 6.25 MG tablet Take 1 tablet (6.25 mg total) by mouth 2 (two) times daily. 180 tablet 3   Cholecalciferol (VITAMIN D-3) 25 MCG (1000 UT) CAPS Take 1,000 Units by mouth at bedtime.     diphenhydrAMINE (BENADRYL) 25 MG tablet Take 25 mg by mouth at bedtime as needed for sleep.     ENTRESTO 49-51 MG Take 1 tablet by mouth twice daily 180 tablet 0   furosemide (LASIX) 40 MG tablet Take 40 mg by mouth daily.     gabapentin (NEURONTIN) 100 MG capsule 1-3 capsules     guaiFENesin (MUCINEX) 600 MG 12 hr tablet Take 1,200 mg by mouth 2 (two) times daily as  needed for cough or to loosen phlegm.     insulin glargine (LANTUS) 100 unit/mL SOPN Inject 44 Units into the skin at bedtime.     levothyroxine (SYNTHROID) 175 MCG tablet Take 224 mcg by mouth daily before breakfast.     Multiple Vitamins-Minerals (PRESERVISION AREDS 2 PO) Take 1 tablet by mouth 2 (two) times daily.     omega-3 acid ethyl esters (LOVAZA) 1 g capsule Take 2 g by mouth 2 (two) times daily.      pantoprazole (PROTONIX) 40 MG tablet Take 40 mg by mouth every morning.     sildenafil (REVATIO) 20 MG tablet Take 100 mg by mouth daily as needed (ED).     traMADol (ULTRAM) 50 MG tablet Take 50 mg by mouth every 6 (six) hours as needed for severe pain.     triamcinolone cream (KENALOG) 0.1 % Apply 1 application topically 2 (two) times daily as needed (skin irritation).     TRULICITY 2.95 JO/8.4ZY SOPN Inject 0.75 mg into the skin once a week. Monday     XARELTO 2.5 MG TABS tablet Take 1 tablet by mouth twice daily 180 tablet 0   No current facility-administered medications for this visit.    Physical Exam BP 138/87 (BP Location: Right Arm, Patient Position: Sitting, Cuff Size: Normal)   Pulse (!) 54   Resp 20   Ht '5\' 7"'$  (1.702 m)   Wt 199 lb 9.6 oz (90.5 kg)   SpO2 96% Comment: RA  BMI 31.76 kg/m  70 year old man in no acute distress Alert and oriented x3 with no focal deficits Lungs clear bilaterally with no rales or wheezing Cardiac bradycardic, regular No cervical or supraclavicular adenopathy  Diagnostic Tests: CT CHEST WITHOUT CONTRAST   TECHNIQUE: Multidetector CT imaging of the chest was performed using thin slice collimation for electromagnetic bronchoscopy planning purposes, without intravenous contrast.   RADIATION DOSE REDUCTION: This exam was performed according to the departmental dose-optimization program which includes automated exposure control, adjustment of the mA and/or kV according to patient size and/or use of iterative reconstruction technique.    COMPARISON:  January 09, 2021   FINDINGS: Cardiovascular: Post median sternotomy for CABG. Calcified coronary artery disease and calcified generalized aortic atherosclerosis similar to previous imaging. Heart size is stable. No pericardial effusion or nodularity. Central pulmonary vasculature is normal caliber. Limited assessment of cardiovascular structures given lack of intravenous  contrast.   Mediastinum/Nodes: No thoracic inlet, axillary, mediastinal or hilar adenopathy. Esophagus grossly normal.   Lungs/Pleura: No consolidation. No pleural effusion. Visualized airways are patent.   Ground-glass nodule in the RIGHT upper lobe without solid component measuring approximately 19 x 17 mm, within 1-2 mm of previous measurements. This is clearly larger than in March of 2019 where it measured approximately 16 x 14 mm. No new nodule.   Upper Abdomen: Cholelithiasis. No acute findings about the gallbladder. Lobular hepatic contours and mild splenomegaly.   Cystic lesion of the pancreas (image 143/2) this measures 2.7 x 2.5 cm previously when measured in a similar fashion on the exam of December of 2022 approximally 2.7 x 2.5 cm and also 2.7 x 2.6 cm in April of 2019. Recently this was evaluated with MRI. Large RIGHT renal cyst, partially imaged shown to have benign characteristics on the MRI of Oct 01, 2019 compatible with Bosniak category I lesion for which no additional dedicated follow-up imaging is recommended.   Musculoskeletal: Gynecomastia.   IMPRESSION: 1. Ground-glass nodule in the RIGHT upper lobe not substantially changed compared to most recent imaging from August of 2022, enlarged since 2019. Findings could represent a very slowly growing/indolent bronchogenic neoplasm but currently without solid component. Consider continued annual follow-up. 2. Cystic lesion of the pancreas measures 2.7 x 2.5 cm previously when measured in a similar fashion on the exam of December  of 2022 approximally 2.7 x 2.5 cm and also 2.7 x 2.6 cm in April of 2019. Recently this was evaluated with MRI. Refer to prior MRI for follow-up recommendations. Annual surveillance with MRI/MRCP is suggested based on stability. 3. Renal ablation changes are not imaged on the current study. 4. Bosniak category I lesion RIGHT renal cyst for which no additional dedicated follow-up imaging is recommended. 5. Cholelithiasis.   Aortic Atherosclerosis (ICD10-I70.0).     Electronically Signed   By: Zetta Bills M.D.   On: 01/18/2022 18:06 I personally reviewed the CT images.  Stable groundglass nodule in the right upper lobe but has increased in size over time.  No solid component.  Ascending aortic ectasia at about 3.9 to 4 cm.  Thoracic aortic atherosclerosis.  Impression: Nathan Medina is a 71 year old man with a past medical history significant for CAD, CABG x3, ischemic cardiomyopathy, hypertension, dyslipidemia, type 2 diabetes, renal cell carcinoma left kidney status post ablation, and a right upper lobe lung nodule.  Right upper lobe groundglass nodule-essentially unchanged from a year ago but definitely has increased in size dating back to 2019.  No solid component.  I discussed the probability that this is a in situ adenocarcinoma with Nathan Medina.  We discussed the options of continued radiographic follow-up, biopsy, or resection.  We reviewed the advantages and disadvantages of those.  He wishes to continue with radiographic follow-up.  He is aware that there is potential for the lesion to become invasive or aggressive in the interim as we monitor it.  That he is going to accept that risk.  Ascending aortic ectasia/thoracic aortic atherosclerosis-ascending aorta measures about 3.9 to 4 cm hard to get a more precise measurement due to postoperative changes and a noncontrast CT.  We will continue to follow as his lung nodule is followed.  CAD status post CABG x3-no anginal  symptoms.  Plan: Return in 1 year with CT chest  Melrose Nakayama, MD [facility] [phone]

## 2022-01-30 IMAGING — CR DG LUMBAR SPINE 2-3V
3 series · 3 of 3 positions shown · non-contrast
Comparison: 05/18/2011

CLINICAL DATA: Atraumatic low back pain for 3 years

EXAM:
LUMBAR SPINE - 2-3 VIEW

[t l-spine a.p.]
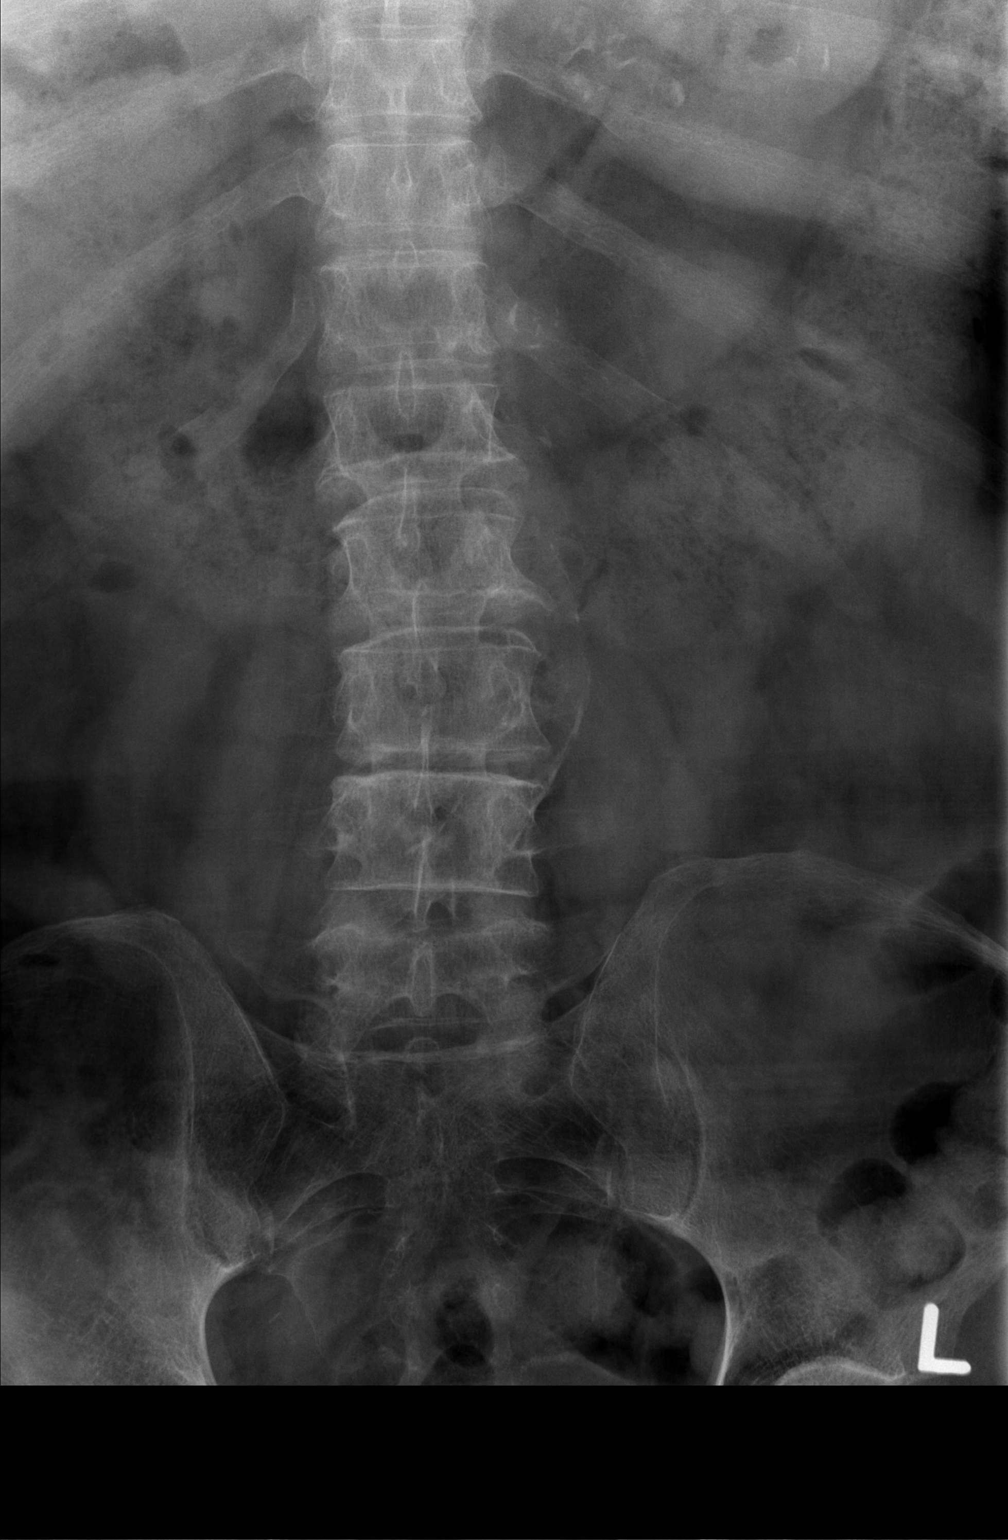

[t l-spine lat]
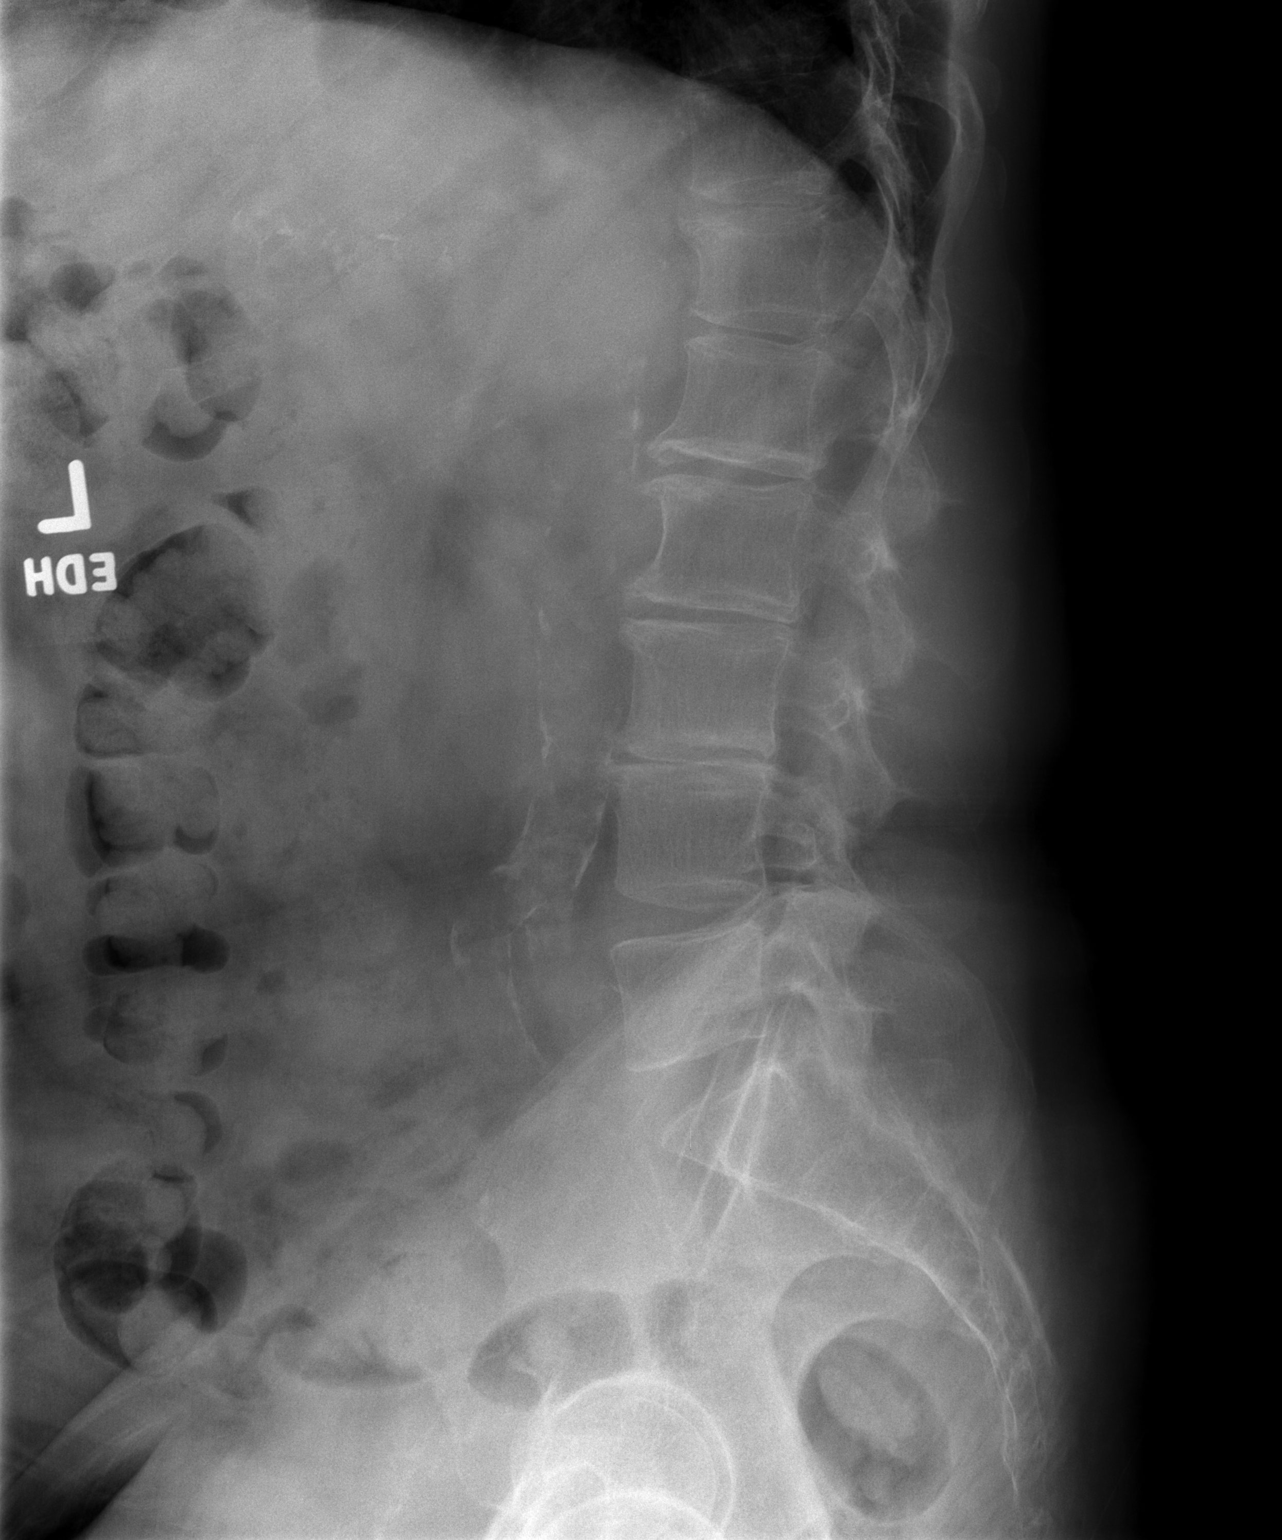

[t l-spine l5-s1 spot]
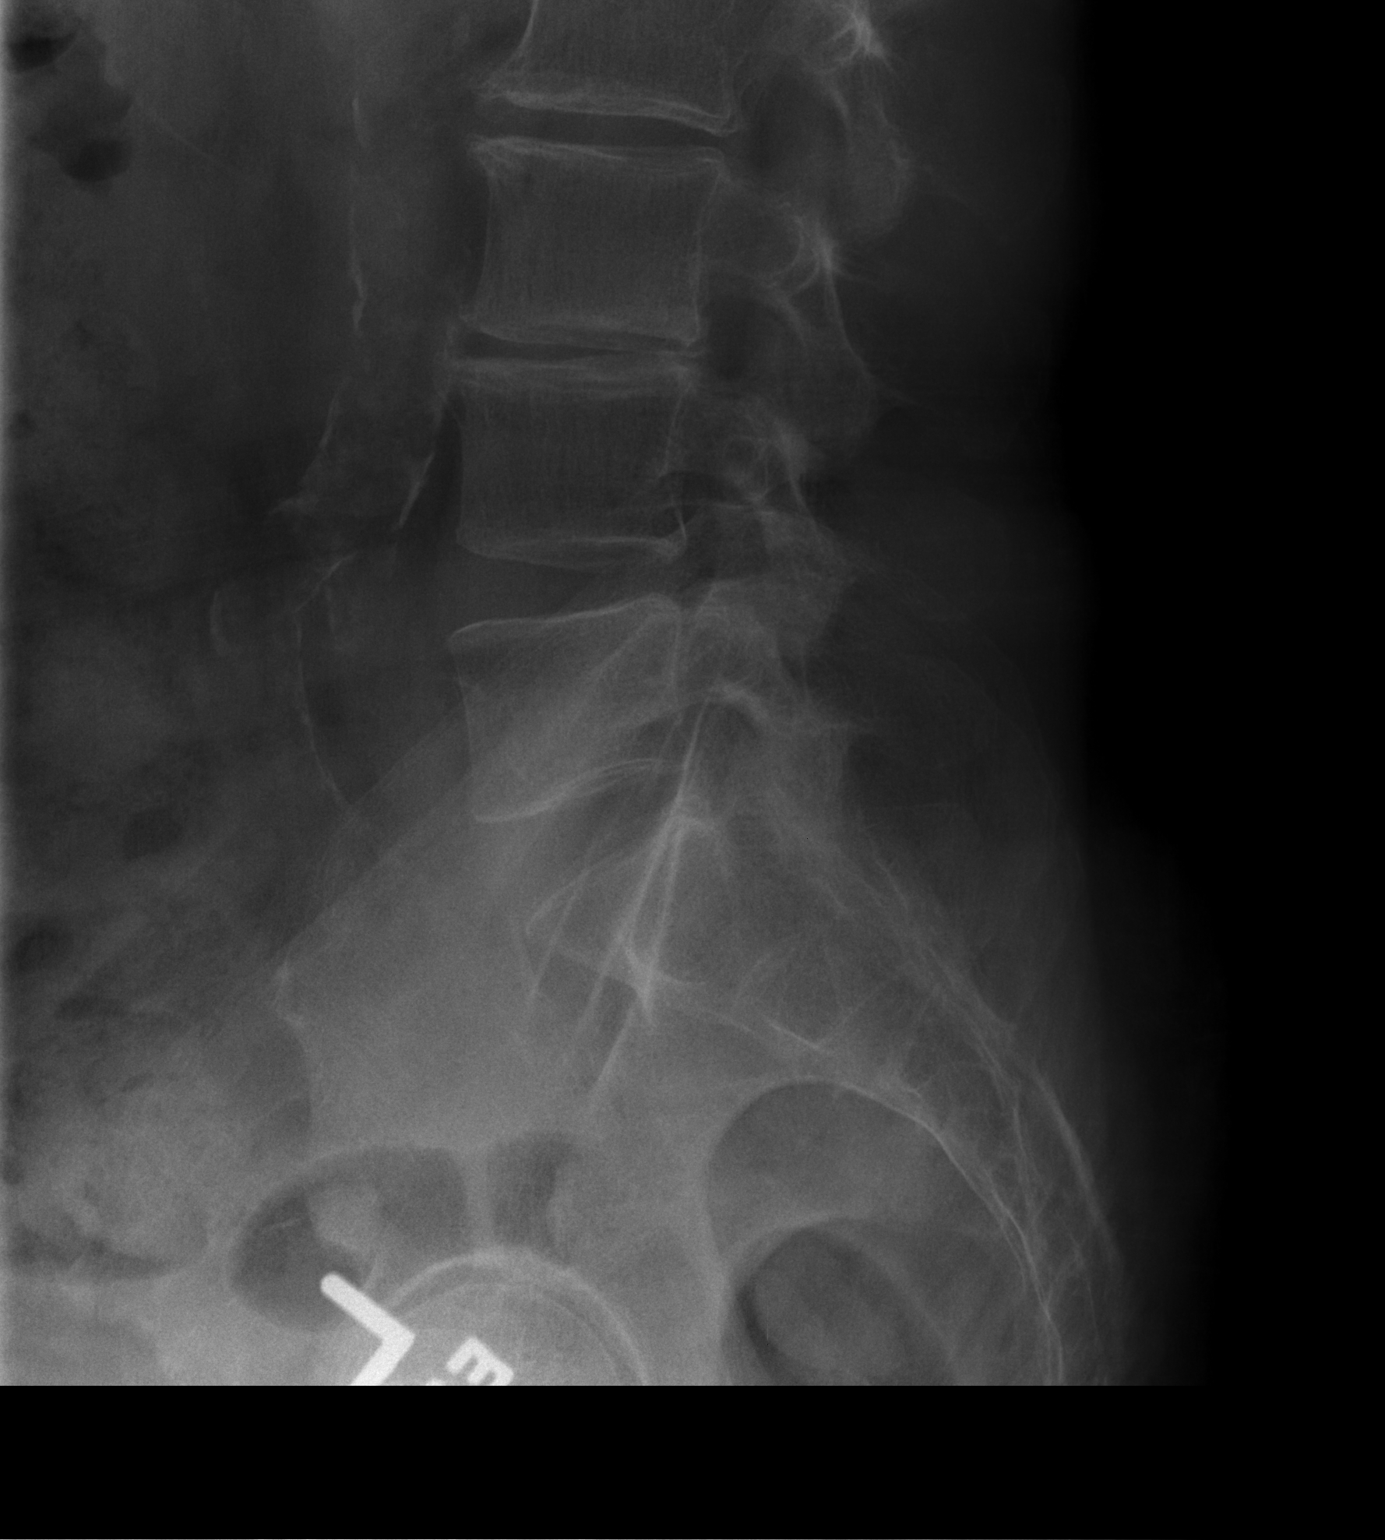

[3 of 3 positions shown; findings below may reference images not displayed]

FINDINGS: Frontal and lateral views of the lumbar spine demonstrate 5
non-rib-bearing lumbar type vertebral bodies with stable mild left
convex scoliosis centered at L3. Otherwise alignment is anatomic.
There is progressive spondylosis throughout the lumbar spine
greatest at the L2-3 and L3-4 levels. No acute fractures. Sacroiliac
joints are normal. Mild progressive atherosclerosis of the abdominal
aorta.
IMPRESSION: 1. Progressive mid lumbar spondylosis.
2. Stable mild left convex scoliosis.
3. No acute bony abnormality.

## 2022-02-06 ENCOUNTER — Ambulatory Visit: Payer: Medicare PPO

## 2022-02-06 DIAGNOSIS — I25810 Atherosclerosis of coronary artery bypass graft(s) without angina pectoris: Secondary | ICD-10-CM | POA: Diagnosis not present

## 2022-02-09 NOTE — Progress Notes (Signed)
I will see him on 10/2 at 1 PM.  Thanks MJP

## 2022-02-09 NOTE — Progress Notes (Signed)
I am off from 10/2-10/10, back on 10/11

## 2022-02-12 ENCOUNTER — Ambulatory Visit: Payer: Medicare PPO | Admitting: Cardiology

## 2022-02-15 DIAGNOSIS — E890 Postprocedural hypothyroidism: Secondary | ICD-10-CM | POA: Diagnosis not present

## 2022-02-19 ENCOUNTER — Encounter: Payer: Self-pay | Admitting: Cardiology

## 2022-02-19 ENCOUNTER — Ambulatory Visit: Payer: Medicare PPO | Admitting: Cardiology

## 2022-02-19 VITALS — BP 130/79 | HR 70 | Temp 98.0°F | Resp 16 | Ht 67.0 in | Wt 202.0 lb

## 2022-02-19 DIAGNOSIS — I25810 Atherosclerosis of coronary artery bypass graft(s) without angina pectoris: Secondary | ICD-10-CM | POA: Diagnosis not present

## 2022-02-19 NOTE — Progress Notes (Signed)
Patient is here for follow up visit.  Subjective:   @Patient  ID: Nathan Medina, male    DOB: 03-29-1951, 71 y.o.   MRN: [id number]  Chief Complaint  Patient presents with   Coronary Artery Disease   Follow-up   Results    echo    71 year old Caucasian male with hypertension, CKD stage III, and 2 diabetes mellitus, ischemic cardiomyopathy with recovered EF, coronary artery disease s/p CABGX3 (LIMA-LAD, SVG-dRCA, SVG-ramus) by Dr. Servando Snare on 04/01/2018, stable renal and lung nodules followed by specialists, s/p thyroidectomy.  Patient has been doing fairly well from cardiac standpoint. He denies chest pain, shortness of breath, palpitations, leg edema, orthopnea, PND, TIA/syncope. Reviewed recent test results with the patient, details below.     Current Outpatient Medications:    acetaminophen (TYLENOL) 500 MG tablet, 1 tablet as needed, Disp: , Rfl:    alendronate (FOSAMAX) 70 MG tablet, Take 70 mg by mouth once a week. Take with a full glass of water on an empty stomach., Disp: , Rfl:    allopurinol (ZYLOPRIM) 300 MG tablet, Take 1 tablet by mouth daily., Disp: , Rfl:    ALPRAZolam (XANAX) 0.5 MG tablet, Take 1 tablet (0.5 mg total) by mouth daily as needed for sleep. 1/2 - 1 by mouth once daily as needed (Patient taking differently: Take 0.25-0.5 mg by mouth at bedtime as needed for sleep.), Disp: 90 tablet, Rfl: 2   aspirin EC 81 MG tablet, Take 81 mg by mouth every other day. , Disp: , Rfl:    aspirin-acetaminophen-caffeine (EXCEDRIN EXTRA STRENGTH) 250-250-65 MG tablet, 2 tablets as needed, Disp: , Rfl:    atorvastatin (LIPITOR) 10 MG tablet, Take 10 mg by mouth at bedtime., Disp: , Rfl:    calcium carbonate (TUMS) 500 MG chewable tablet, Chew 2 tablets (400 mg of elemental calcium total) by mouth 2 (two) times daily. (Patient taking differently: Chew 2 tablets by mouth 2 (two) times daily as needed for indigestion.), Disp: 90 tablet, Rfl: 1   carvedilol (COREG) 6.25 MG  tablet, Take 1 tablet (6.25 mg total) by mouth 2 (two) times daily., Disp: 180 tablet, Rfl: 3   Cholecalciferol (VITAMIN D-3) 25 MCG (1000 UT) CAPS, Take 1,000 Units by mouth at bedtime., Disp: , Rfl:    diphenhydrAMINE (BENADRYL) 25 MG tablet, Take 25 mg by mouth at bedtime as needed for sleep., Disp: , Rfl:    ENTRESTO 49-51 MG, Take 1 tablet by mouth twice daily, Disp: 180 tablet, Rfl: 0   furosemide (LASIX) 40 MG tablet, Take 40 mg by mouth daily., Disp: , Rfl:    gabapentin (NEURONTIN) 100 MG capsule, 1-3 capsules, Disp: , Rfl:    guaiFENesin (MUCINEX) 600 MG 12 hr tablet, Take 1,200 mg by mouth 2 (two) times daily as needed for cough or to loosen phlegm., Disp: , Rfl:    insulin glargine (LANTUS) 100 unit/mL SOPN, Inject 44 Units into the skin at bedtime., Disp: , Rfl:    levothyroxine (SYNTHROID) 175 MCG tablet, Take 224 mcg by mouth daily before breakfast., Disp: , Rfl:    Multiple Vitamins-Minerals (PRESERVISION AREDS 2 PO), Take 1 tablet by mouth 2 (two) times daily., Disp: , Rfl:    omega-3 acid ethyl esters (LOVAZA) 1 g capsule, Take 2 g by mouth 2 (two) times daily. , Disp: , Rfl:    pantoprazole (PROTONIX) 40 MG tablet, Take 40 mg by mouth every morning., Disp: , Rfl:    sildenafil (REVATIO) 20 MG tablet, Take  100 mg by mouth daily as needed (ED)., Disp: , Rfl:    traMADol (ULTRAM) 50 MG tablet, Take 50 mg by mouth every 6 (six) hours as needed for severe pain., Disp: , Rfl:    triamcinolone cream (KENALOG) 0.1 %, Apply 1 application topically 2 (two) times daily as needed (skin irritation)., Disp: , Rfl:    TRULICITY 7.02 OV/7.8HY SOPN, Inject 0.75 mg into the skin once a week. Monday, Disp: , Rfl:    XARELTO 2.5 MG TABS tablet, Take 1 tablet by mouth twice daily, Disp: 180 tablet, Rfl: 0    Cardiovascular studies:  EKG 02/19/2022: Sinus rhythm 63 bpm  Left anterior fascicular block Old anterior and inferior infarct Nonspecific anterolateral T wave  abnormality  Echocardiogram 02/06/2022:  Normal LV systolic function with visual EF 50-55%. Left ventricle cavity  is normal in size. Mild concentric hypertrophy of the left ventricle.  Abnormal septal wall motion due to post-operative coronary artery bypass  graft. Doppler evidence of grade I (impaired) diastolic dysfunction,  normal LAP. Left ventricle regional wall motion findings: Apical anterior,  Apical inferior, Apical septal and Apical cap hypokinesis. Calculated EF  53%.  Structurally normal trileaflet aortic valve.  Trace aortic regurgitation.  Structurally normal tricuspid valve with trace regurgitation. No evidence  of pulmonary hypertension.  EF improved compared to 01/2021\  CT Chest w/o contrast 09/04/2018: 1. No change in a ground-glass nodule of the posterior right upper lobe measuring approximately 1.5 cm (series 3, image 31). 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 Recommend additional follow-up in 1 year and annually through 5 years of stability. 2. Partially imaged mass of the left kidney (series 2, image 178), previously characterized as suspicious for malignancy by CT. Cystic lesion of the pancreatic tail measuring 2.9 cm (series 2, image 24). 3.  Coronary artery disease.  Post Bypass TEE 04/01/2018: Tricuspid, Pulmonic, Mitral and Aortic valve unchanged. Anterior and anteroseptal wall motion improved, LVEF > 55% (CO > 6) with vasopressor support. No dissection noted after cannula removed.    Cath 01/14/2018: LM: Normal LAD: Ostial 100% occluded. 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 RPDA distally occluded. Good surgical revascularization targets seen. LVEDP: Normal Recommendation: Diabetic patient with reduced LVEF and high  Syntax score and moderate LAD territory ischemia superimposed on infarct. Recommend CVTS evaluation for CABG  Arterial duplex 11/19/2017: Normal examination. No evidence of hemodynamically significant peripheral arterial disease.  CT chest 08/06/2017: - persistent right upper lobe nodule with increase in size compared to 2018 PET scan 08/26/2017: - very low metabolic activity in the right upper lobe nodule. SUV 1.3. No other significant uptake  Labs: 02/15/2022: TSH 0.07  May-July 2023: Glucose 146, EGFR 52. Rest of the CMP normal HbA1C 6.7% Chol NA, TG 66, HDL NA, LDL NA   Review of Systems  Cardiovascular:  Negative for chest pain, dyspnea on exertion, leg swelling, palpitations and syncope.       Objective:    Vitals:   02/19/22 1252 02/19/22 1259  BP: 131/86 130/79  Pulse: 65 70  Resp: 16   Temp: 98 F (36.7 C)   SpO2: 98%        Physical Exam Vitals and nursing note reviewed.  Constitutional:      General: He  is not in acute distress. Neck:     Vascular: No JVD.  Cardiovascular:     Rate and Rhythm: Normal rate and regular rhythm.     Heart sounds: Normal heart sounds. No murmur heard. Pulmonary:     Effort: Pulmonary effort is normal.     Breath sounds: Normal breath sounds. No wheezing or rales.  Musculoskeletal:     Right lower leg: No edema.     Left lower leg: No edema.         Assessment & Recommendations:   71 year old Caucasian male with hypertension, CKD stage III, and 2 diabetes mellitus, ischemic cardiomyopathy with recovered EF, coronary artery disease s/p CABGX3 (LIMA-LAD, SVG-dRCA, SVG-ramus) by Dr. Servando Snare on 04/01/2018, stable renal and lung nodules followed by specialists, s/p thyroidectomy, stable thrombocytopenia  Ischemic cardiomyopathy: Recovered EF 50-55% (12/2019).  Reviewed and compared with previous echocardiograms. Apical akinesis is stable and unchanged.  Continue Entresto to 49-51 mg bid, coreg 6.25 mg bid.  Continue lasix  40 mg every other day. He was taken off Jardiance in the past by Dr. Buddy Duty, although does not recollect why. If no contraindication, could consider [place]. Will defer to Dr. Buddy Duty.   CAD: S/p CABG. No angina symptoms. Continue aspirin to 81 mg daily, Xarelto 2.5 mg bid.  Dilated aortic root: Mild, stable Has annual CT and f/u w/Dr Roxan Hockey  Postop Afib: No recurrence. Will monitor.  Hyperlipidemia: Well controlled. TG have also improved. Continue lipitor 10 mg daily.   Thrombocytopenia: Mild, stable. No bleeding. Continue f/u w/PCP.   Management of DM, CKD III, renal and lung nodules as per PCP and other specialists.  F/u in 1 year   [name], MD Pager: [phone] Office: [phone]

## 2022-03-08 ENCOUNTER — Other Ambulatory Visit: Payer: Self-pay | Admitting: Cardiology

## 2022-03-10 ENCOUNTER — Other Ambulatory Visit: Payer: Self-pay | Admitting: Cardiology

## 2022-04-03 ENCOUNTER — Other Ambulatory Visit: Payer: Self-pay | Admitting: Interventional Radiology

## 2022-04-03 DIAGNOSIS — N2889 Other specified disorders of kidney and ureter: Secondary | ICD-10-CM

## 2022-04-06 DIAGNOSIS — E1122 Type 2 diabetes mellitus with diabetic chronic kidney disease: Secondary | ICD-10-CM | POA: Diagnosis not present

## 2022-04-06 DIAGNOSIS — M810 Age-related osteoporosis without current pathological fracture: Secondary | ICD-10-CM | POA: Diagnosis not present

## 2022-04-06 DIAGNOSIS — E890 Postprocedural hypothyroidism: Secondary | ICD-10-CM | POA: Diagnosis not present

## 2022-04-06 DIAGNOSIS — Z85850 Personal history of malignant neoplasm of thyroid: Secondary | ICD-10-CM | POA: Diagnosis not present

## 2022-04-09 ENCOUNTER — Other Ambulatory Visit: Payer: Self-pay | Admitting: Internal Medicine

## 2022-04-09 DIAGNOSIS — R195 Other fecal abnormalities: Secondary | ICD-10-CM | POA: Diagnosis not present

## 2022-04-09 DIAGNOSIS — Z85850 Personal history of malignant neoplasm of thyroid: Secondary | ICD-10-CM | POA: Diagnosis not present

## 2022-04-09 DIAGNOSIS — M810 Age-related osteoporosis without current pathological fracture: Secondary | ICD-10-CM

## 2022-04-09 DIAGNOSIS — Z794 Long term (current) use of insulin: Secondary | ICD-10-CM | POA: Diagnosis not present

## 2022-04-09 DIAGNOSIS — E1122 Type 2 diabetes mellitus with diabetic chronic kidney disease: Secondary | ICD-10-CM | POA: Diagnosis not present

## 2022-04-09 DIAGNOSIS — E890 Postprocedural hypothyroidism: Secondary | ICD-10-CM | POA: Diagnosis not present

## 2022-04-30 ENCOUNTER — Ambulatory Visit (HOSPITAL_COMMUNITY)
Admission: RE | Admit: 2022-04-30 | Discharge: 2022-04-30 | Disposition: A | Discharge status: Home / Self Care | Payer: Medicare PPO | Source: Ambulatory Visit | Attending: Interventional Radiology | Admitting: Interventional Radiology

## 2022-04-30 DIAGNOSIS — E042 Nontoxic multinodular goiter: Secondary | ICD-10-CM

## 2022-04-30 DIAGNOSIS — K862 Cyst of pancreas: Secondary | ICD-10-CM | POA: Diagnosis not present

## 2022-04-30 DIAGNOSIS — I10 Essential (primary) hypertension: Secondary | ICD-10-CM

## 2022-04-30 DIAGNOSIS — Z9889 Other specified postprocedural states: Secondary | ICD-10-CM | POA: Diagnosis not present

## 2022-04-30 DIAGNOSIS — E1122 Type 2 diabetes mellitus with diabetic chronic kidney disease: Secondary | ICD-10-CM | POA: Diagnosis present

## 2022-04-30 DIAGNOSIS — Z794 Long term (current) use of insulin: Secondary | ICD-10-CM | POA: Diagnosis present

## 2022-04-30 DIAGNOSIS — R911 Solitary pulmonary nodule: Secondary | ICD-10-CM

## 2022-04-30 DIAGNOSIS — M5416 Radiculopathy, lumbar region: Secondary | ICD-10-CM

## 2022-04-30 DIAGNOSIS — N281 Cyst of kidney, acquired: Secondary | ICD-10-CM | POA: Diagnosis not present

## 2022-04-30 DIAGNOSIS — D440 Neoplasm of uncertain behavior of thyroid gland: Secondary | ICD-10-CM | POA: Diagnosis present

## 2022-04-30 DIAGNOSIS — E890 Postprocedural hypothyroidism: Secondary | ICD-10-CM | POA: Insufficient documentation

## 2022-04-30 DIAGNOSIS — Z860100 Personal history of colon polyps, unspecified: Secondary | ICD-10-CM

## 2022-04-30 DIAGNOSIS — I5022 Chronic systolic (congestive) heart failure: Secondary | ICD-10-CM | POA: Diagnosis not present

## 2022-04-30 DIAGNOSIS — R21 Rash and other nonspecific skin eruption: Secondary | ICD-10-CM

## 2022-04-30 DIAGNOSIS — R9439 Abnormal result of other cardiovascular function study: Secondary | ICD-10-CM

## 2022-04-30 DIAGNOSIS — I25810 Atherosclerosis of coronary artery bypass graft(s) without angina pectoris: Secondary | ICD-10-CM

## 2022-04-30 DIAGNOSIS — N321 Vesicointestinal fistula: Secondary | ICD-10-CM | POA: Diagnosis present

## 2022-04-30 DIAGNOSIS — L720 Epidermal cyst: Secondary | ICD-10-CM | POA: Diagnosis present

## 2022-04-30 DIAGNOSIS — I77810 Thoracic aortic ectasia: Secondary | ICD-10-CM | POA: Diagnosis not present

## 2022-04-30 DIAGNOSIS — R7989 Other specified abnormal findings of blood chemistry: Secondary | ICD-10-CM

## 2022-04-30 DIAGNOSIS — Z0000 Encounter for general adult medical examination without abnormal findings: Secondary | ICD-10-CM | POA: Diagnosis present

## 2022-04-30 DIAGNOSIS — I255 Ischemic cardiomyopathy: Secondary | ICD-10-CM

## 2022-04-30 DIAGNOSIS — N2889 Other specified disorders of kidney and ureter: Secondary | ICD-10-CM | POA: Diagnosis not present

## 2022-04-30 DIAGNOSIS — Z951 Presence of aortocoronary bypass graft: Secondary | ICD-10-CM | POA: Diagnosis not present

## 2022-04-30 DIAGNOSIS — J36 Peritonsillar abscess: Secondary | ICD-10-CM | POA: Diagnosis present

## 2022-04-30 DIAGNOSIS — R972 Elevated prostate specific antigen [PSA]: Secondary | ICD-10-CM | POA: Diagnosis present

## 2022-04-30 DIAGNOSIS — N181 Chronic kidney disease, stage 1: Secondary | ICD-10-CM | POA: Diagnosis present

## 2022-04-30 DIAGNOSIS — K8020 Calculus of gallbladder without cholecystitis without obstruction: Secondary | ICD-10-CM | POA: Diagnosis not present

## 2022-04-30 LAB — POCT I-STAT CREATININE: Creatinine, Ser: 1.4 mg/dL — ABNORMAL HIGH (ref 0.61–1.24)

## 2022-04-30 MED ORDER — IOHEXOL 300 MG/ML  SOLN
100.0000 mL | Freq: Once | INTRAMUSCULAR | Status: AC | PRN
  Administered 2022-04-30: 100 mL via INTRAVENOUS

## 2022-04-30 MED ORDER — SODIUM CHLORIDE (PF) 0.9 % IJ SOLN
INTRAMUSCULAR | Status: AC
  Filled 2022-04-30: qty 50

## 2022-05-04 ENCOUNTER — Ambulatory Visit
Admission: RE | Admit: 2022-05-04 | Discharge: 2022-05-04 | Disposition: A | Discharge status: Home / Self Care | Payer: Medicare PPO | Source: Ambulatory Visit | Attending: Interventional Radiology | Admitting: Interventional Radiology

## 2022-05-04 DIAGNOSIS — N2889 Other specified disorders of kidney and ureter: Secondary | ICD-10-CM

## 2022-05-04 NOTE — Progress Notes (Signed)
Patient ID: Nathan Medina, male   DOB: [birth year], 71 y.o.   MRN: [id number]       Chief Complaint:  Left renal cell carcinoma status post previous cryoablation  Referring Physician(s): Dr. Junious Silk  History of Present Illness: Nathan Medina is a 71 y.o. male With multiple comorbidities including hypertension, chronic kidney disease, diabetes, cardiomyopathy, and previous coronary bypass.  By imaging for other issues, he was found to have an enlarging left renal midpole exophytic lesion compatible with neoplasm by imaging.  Lesion underwent successful biopsy and ablation 07/06/2020.  Pathology revealed papillary renal cell carcinoma.  Overall he is doing well from a renal standpoint.  No urinary tract symptoms.  No hematuria, flank pain or dysuria.  No recent illness or fevers.  Interval surveillance CT 04/30/2022 confirms a stable ablation defect in the left kidney.  No signs of residual disease or recurrence.  No metastatic disease.  Other chronic findings are stable.  Past Medical History:  Diagnosis Date   Abnormal liver function test 05/23/2011   ALLERGIC RHINITIS    ANXIETY    Aortic atherosclerosis (HCC)    Arthritis    Ascending aorta dilation ([place]) 07/24/2018   Bilateral renal cysts    Cervical spondylolysis    Moderate   CHF (congestive heart failure) ([place]) 07/24/2018   Chronic kidney disease    CKD stage 3 per office visit note 08/08/17 on chart    COLONIC POLYPS, HX OF    Coronary artery disease    DEPRESSION    DIABETES MELLITUS, TYPE II    Diverticulitis    Epidermal cyst    Fatty liver    GERD    GOUT    History of inguinal hernia    HOH (hard of hearing)    elft ear   HYPERLIPIDEMIA    HYPERTENSION    IBS    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, takedown and reversal September 2012   Ischemic cardiomyopathy 07/24/2018   Left renal mass 05/23/2011   Lumbar radicular pain 06/03/2011   Macular degeneration     right eye   Multinodular goiter    Myocardial infarction ([place])    silent MI in Pt's 57s   Pancreatic pseudocyst    Stable   Peripheral vascular disease ([place])    Peritonsillar abscess    Pulmonary nodule    Right upper lobe   Radial neck fracture    Right upper lobe pulmonary nodule 07/24/2018   Thyroid disease    Thyroid nodule    Bilateral   Vertigo     Past Surgical History:  Procedure Laterality Date   APPENDECTOMY  02/12/11   COLON SURGERY  08/11/10   sigmoid colectomy   COLON SURGERY  02/12/11   colostomy takedown   COLONOSCOPY     CORONARY ARTERY BYPASS GRAFT N/A 04/01/2018   Procedure: CORONARY ARTERY BYPASS GRAFTING (CABG) x Three, using left internal mammary artery and right leg greater saphenous vein harvested endoscopically;  Surgeon: Grace Isaac, MD;  Location: [facility];  Service: Open Heart Surgery;  Laterality: N/A;   CYST REMOVAL TRUNK Right 02/20/2018   Procedure: epidermal cyst excision right lower back;  Surgeon: [name], MD;  Location: [facility];  Service: General;  Laterality: Right;   HERNIA REPAIR     INSERTION OF MESH N/A 08/20/2012   Procedure: INSERTION OF MESH;  Surgeon: [name], MD;  Location: [facility];  Service: General;  Laterality: N/A;  IR RADIOLOGIST EVAL & MGMT  03/23/2020   IR RADIOLOGIST EVAL & MGMT  07/26/2020   IR RADIOLOGIST EVAL & MGMT  11/01/2020   IR RADIOLOGIST EVAL & MGMT  05/10/2021   KNEE ARTHROSCOPY Right 10/18/2019   LEFT HEART CATH AND CORONARY ANGIOGRAPHY N/A 01/14/2018   Procedure: LEFT HEART CATH AND CORONARY ANGIOGRAPHY;  Surgeon: [name], MD;  Location: [facility];  Service: Cardiovascular;  Laterality: N/A;   LYSIS OF ADHESION  08/20/2012   Procedure: LYSIS OF ADHESION;  Surgeon: [name], MD;  Location: [facility];  Service: General;;   QUADRICEPS TENDON REPAIR Right 10/20/2019   Procedure: REPAIR RIGHT QUADRICEP TENDON;  Surgeon: Paralee Cancel, MD;  Location: [facility];  Service: Orthopedics;   Laterality: Right;   RADIOLOGY WITH ANESTHESIA N/A 06/22/2020   Procedure: CT WITH ANESTHESIA  CRYOABLATION;  Surgeon: Greggory Keen, MD;  [facility];  Service: Anesthesiology;  Laterality: N/A;   RADIOLOGY WITH ANESTHESIA N/A 07/06/2020   Procedure: CT WITH ANESTHESIA;  Surgeon: Greggory Keen, MD;  Location: [facility];  Service: Anesthesiology;  Laterality: N/A;   SEPTOPLASTY  age 37   TEE WITHOUT CARDIOVERSION N/A 04/01/2018   Procedure: TRANSESOPHAGEAL ECHOCARDIOGRAM (TEE);  Surgeon: Grace Isaac, MD;  Location: [facility];  Service: Open Heart Surgery;  Laterality: N/A;   THYROIDECTOMY N/A 10/17/2017   Procedure: TOTAL THYROIDECTOMY;  Surgeon: [name], MD;  [facility];  Service: General;  Laterality: N/A;   TONSILLECTOMY Right 08/01/2016   Procedure: INCISION AND DRAINAGE RIGHT PERI-TONSILLAR ABSCESS;  Surgeon: [name], MD;  Location[facility];  Service: ENT;  Laterality: Right;   ULTRASOUND GUIDANCE FOR VASCULAR ACCESS  01/14/2018   Procedure: Ultrasound Guidance For Vascular Access;  Surgeon: [name], MD;  Location: [facility];  Service: Cardiovascular;;   VENTRAL HERNIA REPAIR  08/20/2012   Procedure: HERNIA REPAIR VENTRAL ADULT;  Surgeon: [name], MD;  [facility];  Service: General;;    Allergies: Ciprofloxacin, Escitalopram oxalate, Quinapril hcl, and Sulfa antibiotics  Medications: Prior to Admission medications   Medication Sig Start Date End Date Taking? Authorizing Provider  acetaminophen (TYLENOL) 500 MG tablet 1 tablet as needed    [provider]  alendronate (FOSAMAX) 70 MG tablet Take 70 mg by mouth once a week. Take with a full glass of water on an empty stomach.    [provider]  allopurinol (ZYLOPRIM) 300 MG tablet Take 1 tablet by mouth daily.    [provider]  ALPRAZolam Duanne Moron) 0.5 MG tablet Take 1 tablet (0.5 mg total) by mouth daily as needed for sleep. 1/2 - 1 by mouth once daily as  needed Patient taking differently: Take 0.25-0.5 mg by mouth at bedtime as needed for sleep. 04/03/12   Biagio Borg, MD  aspirin EC 81 MG tablet Take 81 mg by mouth every other day.     [provider]  aspirin-acetaminophen-caffeine (EXCEDRIN EXTRA STRENGTH) [phone] MG tablet 2 tablets as needed    [provider]  atorvastatin (LIPITOR) 10 MG tablet Take 10 mg by mouth at bedtime.    [provider]  calcium carbonate (TUMS) 500 MG chewable tablet Chew 2 tablets (400 mg of elemental calcium total) by mouth 2 (two) times daily. Patient taking differently: Chew 2 tablets by mouth 2 (two) times daily as needed for indigestion. 10/18/17   [name], MD  carvedilol (COREG) 6.25 MG tablet Take 1 tablet (6.25  mg total) by mouth 2 (two) times daily. 05/05/19   Patwardhan, Reynold Bowen, MD  Cholecalciferol (VITAMIN D-3) 25 MCG (1000 UT) CAPS Take 1,000 Units by mouth at bedtime.    [provider]  diphenhydrAMINE (BENADRYL) 25 MG tablet Take 25 mg by mouth at bedtime as needed for sleep.    [provider]  ENTRESTO 49-51 MG Take 1 tablet by mouth twice daily 03/08/22   Patwardhan, Reynold Bowen, MD  furosemide (LASIX) 40 MG tablet Take 40 mg by mouth daily.    [provider]  gabapentin (NEURONTIN) 100 MG capsule 1-3 capsules 12/19/21   [provider]  guaiFENesin (MUCINEX) 600 MG 12 hr tablet Take 1,200 mg by mouth 2 (two) times daily as needed for cough or to loosen phlegm.    [provider]  insulin glargine (LANTUS) 100 unit/mL SOPN Inject 44 Units into the skin at bedtime.    [provider]  levothyroxine (SYNTHROID) 175 MCG tablet Take 224 mcg by mouth daily before breakfast. 10/04/19   [provider]  Multiple Vitamins-Minerals (PRESERVISION AREDS 2 PO) Take 1 tablet by mouth 2 (two) times daily.    [provider]  omega-3 acid ethyl esters (LOVAZA) 1 g capsule Take 2 g by mouth 2 (two) times daily.      [provider]  pantoprazole (PROTONIX) 40 MG tablet Take 40 mg by mouth every morning. 09/29/19   [provider]  sildenafil (REVATIO) 20 MG tablet Take 100 mg by mouth daily as needed (ED).    [provider]  traMADol (ULTRAM) 50 MG tablet Take 50 mg by mouth every 6 (six) hours as needed for severe pain.    [provider]  triamcinolone cream (KENALOG) 0.1 % Apply 1 application topically 2 (two) times daily as needed (skin irritation).    [provider]  TRULICITY 6.31 SH/7.72YO SOPN Inject 0.75 mg into the skin once a week. Monday 02/26/19   [provider]  XARELTO 2.5 MG TABS tablet Take 1 tablet by mouth twice daily 03/12/22   Patwardhan, Reynold Bowen, MD     Family History  Problem Relation Age of Onset   Hypertension Paternal Aunt    Stroke Maternal Grandmother    Hypertension Maternal Grandmother     Social History   Socioeconomic History   Marital status: Single    Spouse name: Not on file   Number of children: 0   Years of education: Not on file   Highest education level: Not on file  Occupational History   Not on file  Tobacco Use   Smoking status: Never   Smokeless tobacco: Never  Vaping Use   Vaping Use: Never used  Substance and Sexual Activity   Alcohol use: Yes    Comment: occ   Drug use: No   Sexual activity: Not on file  Other Topics Concern   Not on file  Social History Narrative   Not on file   Social Determinants of Health   Financial Resource Strain: Not on file  Food Insecurity: Not on file  Transportation Needs: Not on file  Physical Activity: Not on file  Stress: Not on file  Social Connections: Not on file     Review of Systems  Review of Systems: A 12 point ROS discussed and pertinent positives are indicated in the HPI above.  All other systems are negative.    Physical Exam No direct physical exam was performed , Telephone health visit only  today to review surveillance  imaging and plan.  Vital Signs: There were no vitals taken for this visit.  Imaging: CT ABDOMEN W WO CONTRAST  Result Date: 04/30/2022 CLINICAL DATA:  Post cryoablation of LEFT renal mass follow-up evaluation. EXAM: CT ABDOMEN WITHOUT AND WITH CONTRAST TECHNIQUE: Multidetector CT imaging of the abdomen was performed following the standard protocol before and following the bolus administration of intravenous contrast. RADIATION DOSE REDUCTION: This exam was performed according to the departmental dose-optimization program which includes automated exposure control, adjustment of the mA and/or kV according to patient size and/or use of iterative reconstruction technique. CONTRAST:  147m OMNIPAQUE IOHEXOL 300 MG/ML  SOLN COMPARISON:  Multiple prior studies most recent from April 21, 2021. FINDINGS: Lower chest: Incidental imaging of the lung bases without sign of effusion or sign of consolidative change. Three-vessel coronary artery disease. Hepatobiliary: No focal, suspicious hepatic lesion. No pericholecystic stranding. No biliary duct dilation. Portal vein is patent. Mild fissural widening of hepatic fissures. Cholelithiasis. Pancreas: Pancreatic atrophy. Cystic lesion in the tail of the pancreas measuring 2.6 cm with thin wall and without enhancement not changed since at least November of 2020. Peripheral atrophy of pancreatic parenchyma. Spleen: Splenomegaly similar to prior imaging. Adrenals/Urinary Tract: Adrenal glands are normal. LEFT kidney: In the interpolar LEFT kidney along the anterior aspect of the lateral cortex there are changes related to prior renal mass ablation. Central low attenuation measuring 2.0 x 1.8 as compared to 2.3 x 2.1 cm without signs of nodular enhancement and with peripheral halo of fibrotic changes. No signs to suggest disease recurrence. No additional suspicious renal lesions with small renal cysts in the LEFT kidney. RIGHT kidney: No suspicious renal lesion on the RIGHT.  Moderately large RIGHT renal cyst with Bosniak category I characteristics 6.4 x 5.8 cm with imperceptible wall and no nodular enhancement or other suspicious features. No dedicated imaging follow-up for Bosniak category I and II lesions is recommended. Stomach/Bowel: No acute gastrointestinal process. Pelvic bowel loops are not imaged. Vascular/Lymphatic: Aortic atherosclerosis. Atherosclerotic changes track into visceral branches. Dedicated origin of hepatic arterial supply from the aorta with SMA giving rise to splenic arterial supply. There is no gastrohepatic or hepatoduodenal ligament lymphadenopathy. No retroperitoneal or mesenteric lymphadenopathy. Other: No ascites Musculoskeletal: No acute bone finding. No destructive bone process. Spinal degenerative changes. IMPRESSION: 1. Post ablation changes in the interpolar LEFT kidney without signs of disease recurrence or metastatic disease. 2. Cystic lesion in the tail of the pancreas measuring 2.6 cm with thin wall and without enhancement not changed since at least November of 2020. This may represent a pseudocyst or indolent cystic neoplasm. Suggest attention on follow-up. 3. Cholelithiasis. 4. Three-vessel coronary artery disease. 5. Aortic atherosclerosis. 6. Lobular hepatic contours with mild splenomegaly. Correlate with any clinical or laboratory evidence of liver disease. Aortic Atherosclerosis (ICD10-I70.0). Electronically Signed   By: GZetta BillsM.D.   On: 04/30/2022 14:55    Labs:  CBC: No results for input(s): "WBC", "HGB", "HCT", "PLT" in the last 8760 hours.  COAGS: No results for input(s): "INR", "APTT" in the last 8760 hours.  BMP: Recent Labs    04/30/22 0901  CREATININE 1.40*    LIVER FUNCTION TESTS: No results for input(s): "BILITOT", "AST", "ALT", "ALKPHOS", "PROT", "ALBUMIN" in the last 8760 hours.   Assessment and Plan:  22 months status post biopsy and cryoablation of a left papillary renal cell carcinoma.   Surveillance imaging confirms a stable ablation defect without signs of residual recurrent disease.  No  new renal abnormality.  He remains asymptomatic.  Excellent functional status.  Plan: Continue surveillance imaging with CT at 12 months it was a long hospital for total of 5 years post ablation.   Thank you for this interesting consult.  I greatly enjoyed meeting ROOSVELT CHURCHWELL and look forward to participating in their care.  A copy of this report was sent to the requesting provider on this date.  Electronically Signed: Greggory Keen 05/04/2022, 9:12 AM   I spent a total of    40 Minutes in remote  clinical consultation, greater than 50% of which was counseling/coordinating care for This patient with renal cell carcinoma.    Visit type: Audio only (telephone). Audio (no video) only due to Communication of surveillance imaging.  Video not required..  Alternative for in-person consultation at [facility], [address] [place], [place], [place]. This visit type was conducted due to national recommendations for restrictions regarding the COVID-19 Pandemic (e.g. social distancing).  This format is felt to be most appropriate for this patient at this time.  All issues noted in this document were discussed and addressed.

## 2022-05-16 DIAGNOSIS — R5383 Other fatigue: Secondary | ICD-10-CM | POA: Diagnosis not present

## 2022-05-16 DIAGNOSIS — R143 Flatulence: Secondary | ICD-10-CM | POA: Diagnosis not present

## 2022-05-16 DIAGNOSIS — R4189 Other symptoms and signs involving cognitive functions and awareness: Secondary | ICD-10-CM | POA: Diagnosis not present

## 2022-05-16 DIAGNOSIS — K30 Functional dyspepsia: Secondary | ICD-10-CM | POA: Diagnosis not present

## 2022-05-16 DIAGNOSIS — R110 Nausea: Secondary | ICD-10-CM | POA: Diagnosis not present

## 2022-05-21 IMAGING — CR DG CHEST 1V
1 series · 1 of 1 positions shown · non-contrast
Comparison: 05/08/2018 and CT chest 05/05/2020.

CLINICAL DATA: Preop liver biopsy.

EXAM:
CHEST  1 VIEW

[w chest pa]
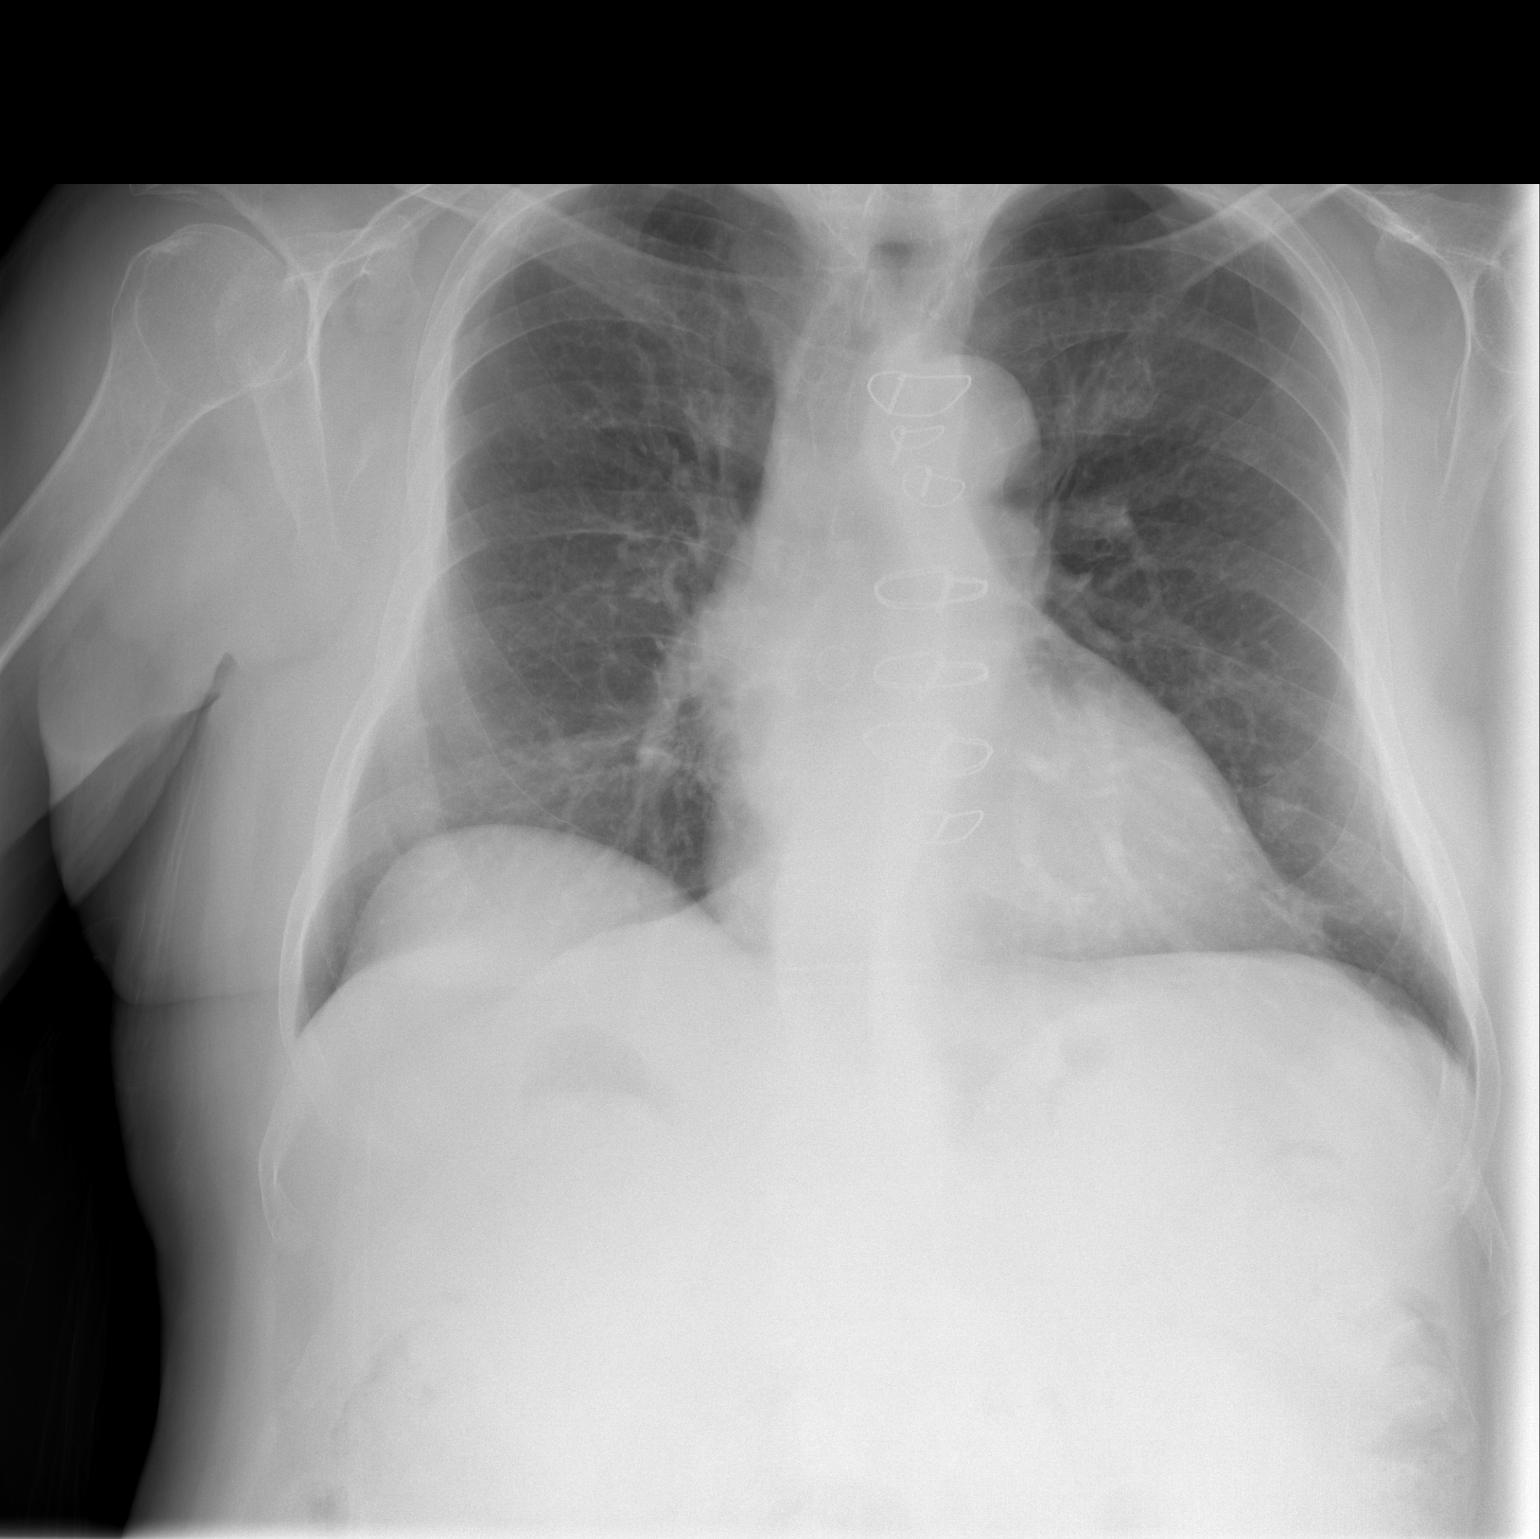

[1 of 1 positions shown; findings below may reference images not displayed]

FINDINGS: Trachea is midline. Heart size stable. Lungs are clear. No pleural
fluid.
IMPRESSION: No acute findings.

## 2022-05-24 DIAGNOSIS — E119 Type 2 diabetes mellitus without complications: Secondary | ICD-10-CM | POA: Diagnosis not present

## 2022-05-24 DIAGNOSIS — H33311 Horseshoe tear of retina without detachment, right eye: Secondary | ICD-10-CM | POA: Diagnosis not present

## 2022-05-24 DIAGNOSIS — Z961 Presence of intraocular lens: Secondary | ICD-10-CM | POA: Diagnosis not present

## 2022-05-24 DIAGNOSIS — H40003 Preglaucoma, unspecified, bilateral: Secondary | ICD-10-CM | POA: Diagnosis not present

## 2022-05-24 DIAGNOSIS — H43813 Vitreous degeneration, bilateral: Secondary | ICD-10-CM | POA: Diagnosis not present

## 2022-05-24 DIAGNOSIS — H353132 Nonexudative age-related macular degeneration, bilateral, intermediate dry stage: Secondary | ICD-10-CM | POA: Diagnosis not present

## 2022-05-28 DIAGNOSIS — H40013 Open angle with borderline findings, low risk, bilateral: Secondary | ICD-10-CM | POA: Diagnosis not present

## 2022-05-30 DIAGNOSIS — K9289 Other specified diseases of the digestive system: Secondary | ICD-10-CM | POA: Diagnosis not present

## 2022-05-30 DIAGNOSIS — R350 Frequency of micturition: Secondary | ICD-10-CM | POA: Diagnosis not present

## 2022-06-06 ENCOUNTER — Other Ambulatory Visit: Payer: Self-pay | Admitting: Cardiology

## 2022-06-11 ENCOUNTER — Other Ambulatory Visit: Payer: Self-pay | Admitting: Cardiology

## 2022-06-13 DIAGNOSIS — R350 Frequency of micturition: Secondary | ICD-10-CM | POA: Diagnosis not present

## 2022-06-13 DIAGNOSIS — R972 Elevated prostate specific antigen [PSA]: Secondary | ICD-10-CM | POA: Diagnosis not present

## 2022-06-13 DIAGNOSIS — N401 Enlarged prostate with lower urinary tract symptoms: Secondary | ICD-10-CM | POA: Diagnosis not present

## 2022-06-18 DIAGNOSIS — H40013 Open angle with borderline findings, low risk, bilateral: Secondary | ICD-10-CM | POA: Diagnosis not present

## 2022-06-19 DIAGNOSIS — N2581 Secondary hyperparathyroidism of renal origin: Secondary | ICD-10-CM | POA: Diagnosis not present

## 2022-06-19 DIAGNOSIS — D631 Anemia in chronic kidney disease: Secondary | ICD-10-CM | POA: Diagnosis not present

## 2022-06-19 DIAGNOSIS — M109 Gout, unspecified: Secondary | ICD-10-CM | POA: Diagnosis not present

## 2022-06-19 DIAGNOSIS — N2889 Other specified disorders of kidney and ureter: Secondary | ICD-10-CM | POA: Diagnosis not present

## 2022-06-19 DIAGNOSIS — N183 Chronic kidney disease, stage 3 unspecified: Secondary | ICD-10-CM | POA: Diagnosis not present

## 2022-06-19 DIAGNOSIS — R809 Proteinuria, unspecified: Secondary | ICD-10-CM | POA: Diagnosis not present

## 2022-06-19 DIAGNOSIS — I2510 Atherosclerotic heart disease of native coronary artery without angina pectoris: Secondary | ICD-10-CM | POA: Diagnosis not present

## 2022-06-19 DIAGNOSIS — I129 Hypertensive chronic kidney disease with stage 1 through stage 4 chronic kidney disease, or unspecified chronic kidney disease: Secondary | ICD-10-CM | POA: Diagnosis not present

## 2022-06-19 DIAGNOSIS — E1122 Type 2 diabetes mellitus with diabetic chronic kidney disease: Secondary | ICD-10-CM | POA: Diagnosis not present

## 2022-06-26 DIAGNOSIS — R198 Other specified symptoms and signs involving the digestive system and abdomen: Secondary | ICD-10-CM | POA: Diagnosis not present

## 2022-06-26 DIAGNOSIS — R140 Abdominal distension (gaseous): Secondary | ICD-10-CM | POA: Diagnosis not present

## 2022-06-26 DIAGNOSIS — K9289 Other specified diseases of the digestive system: Secondary | ICD-10-CM | POA: Diagnosis not present

## 2022-06-27 DIAGNOSIS — R198 Other specified symptoms and signs involving the digestive system and abdomen: Secondary | ICD-10-CM | POA: Diagnosis not present

## 2022-06-27 DIAGNOSIS — K9289 Other specified diseases of the digestive system: Secondary | ICD-10-CM | POA: Diagnosis not present

## 2022-07-31 DIAGNOSIS — K638219 Small intestinal bacterial overgrowth, unspecified: Secondary | ICD-10-CM | POA: Diagnosis not present

## 2022-07-31 DIAGNOSIS — K63829 Intestinal methanogen overgrowth, unspecified: Secondary | ICD-10-CM | POA: Diagnosis not present

## 2022-08-06 DIAGNOSIS — Z1211 Encounter for screening for malignant neoplasm of colon: Secondary | ICD-10-CM | POA: Diagnosis not present

## 2022-08-06 DIAGNOSIS — K638219 Small intestinal bacterial overgrowth, unspecified: Secondary | ICD-10-CM | POA: Diagnosis not present

## 2022-08-06 DIAGNOSIS — R198 Other specified symptoms and signs involving the digestive system and abdomen: Secondary | ICD-10-CM | POA: Diagnosis not present

## 2022-08-13 DIAGNOSIS — H353132 Nonexudative age-related macular degeneration, bilateral, intermediate dry stage: Secondary | ICD-10-CM | POA: Diagnosis not present

## 2022-08-13 DIAGNOSIS — Z961 Presence of intraocular lens: Secondary | ICD-10-CM | POA: Diagnosis not present

## 2022-08-13 DIAGNOSIS — H179 Unspecified corneal scar and opacity: Secondary | ICD-10-CM | POA: Diagnosis not present

## 2022-08-13 DIAGNOSIS — E119 Type 2 diabetes mellitus without complications: Secondary | ICD-10-CM | POA: Diagnosis not present

## 2022-08-30 ENCOUNTER — Other Ambulatory Visit: Payer: Self-pay | Admitting: Cardiology

## 2022-09-03 DIAGNOSIS — N401 Enlarged prostate with lower urinary tract symptoms: Secondary | ICD-10-CM | POA: Diagnosis not present

## 2022-09-04 ENCOUNTER — Other Ambulatory Visit: Payer: Self-pay | Admitting: Cardiology

## 2022-09-10 DIAGNOSIS — R972 Elevated prostate specific antigen [PSA]: Secondary | ICD-10-CM | POA: Diagnosis not present

## 2022-09-10 DIAGNOSIS — N400 Enlarged prostate without lower urinary tract symptoms: Secondary | ICD-10-CM | POA: Diagnosis not present

## 2022-09-10 DIAGNOSIS — R8271 Bacteriuria: Secondary | ICD-10-CM | POA: Diagnosis not present

## 2022-09-14 DIAGNOSIS — Z114 Encounter for screening for human immunodeficiency virus [HIV]: Secondary | ICD-10-CM | POA: Diagnosis not present

## 2022-09-27 ENCOUNTER — Ambulatory Visit
Admission: RE | Admit: 2022-09-27 | Discharge: 2022-09-27 | Disposition: A | Discharge status: Home / Self Care | Payer: Medicare PPO | Source: Ambulatory Visit | Attending: Internal Medicine | Admitting: Internal Medicine

## 2022-09-27 DIAGNOSIS — Z7983 Long term (current) use of bisphosphonates: Secondary | ICD-10-CM | POA: Diagnosis not present

## 2022-09-27 DIAGNOSIS — Z8781 Personal history of (healed) traumatic fracture: Secondary | ICD-10-CM | POA: Diagnosis not present

## 2022-09-27 DIAGNOSIS — M810 Age-related osteoporosis without current pathological fracture: Secondary | ICD-10-CM

## 2022-09-27 DIAGNOSIS — Z8262 Family history of osteoporosis: Secondary | ICD-10-CM | POA: Diagnosis not present

## 2022-10-09 DIAGNOSIS — M810 Age-related osteoporosis without current pathological fracture: Secondary | ICD-10-CM | POA: Diagnosis not present

## 2022-10-09 DIAGNOSIS — Z794 Long term (current) use of insulin: Secondary | ICD-10-CM | POA: Diagnosis not present

## 2022-10-09 DIAGNOSIS — E890 Postprocedural hypothyroidism: Secondary | ICD-10-CM | POA: Diagnosis not present

## 2022-10-09 DIAGNOSIS — Z85850 Personal history of malignant neoplasm of thyroid: Secondary | ICD-10-CM | POA: Diagnosis not present

## 2022-10-09 DIAGNOSIS — E1122 Type 2 diabetes mellitus with diabetic chronic kidney disease: Secondary | ICD-10-CM | POA: Diagnosis not present

## 2022-10-21 DIAGNOSIS — J029 Acute pharyngitis, unspecified: Secondary | ICD-10-CM | POA: Diagnosis not present

## 2022-10-21 DIAGNOSIS — H6992 Unspecified Eustachian tube disorder, left ear: Secondary | ICD-10-CM | POA: Diagnosis not present

## 2022-10-21 DIAGNOSIS — J329 Chronic sinusitis, unspecified: Secondary | ICD-10-CM | POA: Diagnosis not present

## 2022-10-23 DIAGNOSIS — J039 Acute tonsillitis, unspecified: Secondary | ICD-10-CM | POA: Diagnosis not present

## 2022-10-26 DIAGNOSIS — J039 Acute tonsillitis, unspecified: Secondary | ICD-10-CM | POA: Diagnosis not present

## 2022-10-26 DIAGNOSIS — J3489 Other specified disorders of nose and nasal sinuses: Secondary | ICD-10-CM | POA: Diagnosis not present

## 2022-11-25 ENCOUNTER — Other Ambulatory Visit: Payer: Self-pay | Admitting: Cardiology

## 2022-11-28 ENCOUNTER — Other Ambulatory Visit: Payer: Self-pay | Admitting: Cardiology

## 2022-12-18 DIAGNOSIS — I129 Hypertensive chronic kidney disease with stage 1 through stage 4 chronic kidney disease, or unspecified chronic kidney disease: Secondary | ICD-10-CM | POA: Diagnosis not present

## 2022-12-18 DIAGNOSIS — E1122 Type 2 diabetes mellitus with diabetic chronic kidney disease: Secondary | ICD-10-CM | POA: Diagnosis not present

## 2022-12-18 DIAGNOSIS — R972 Elevated prostate specific antigen [PSA]: Secondary | ICD-10-CM | POA: Diagnosis not present

## 2022-12-18 DIAGNOSIS — M109 Gout, unspecified: Secondary | ICD-10-CM | POA: Diagnosis not present

## 2022-12-18 DIAGNOSIS — N2889 Other specified disorders of kidney and ureter: Secondary | ICD-10-CM | POA: Diagnosis not present

## 2022-12-18 DIAGNOSIS — D631 Anemia in chronic kidney disease: Secondary | ICD-10-CM | POA: Diagnosis not present

## 2022-12-18 DIAGNOSIS — N183 Chronic kidney disease, stage 3 unspecified: Secondary | ICD-10-CM | POA: Diagnosis not present

## 2022-12-18 DIAGNOSIS — N2581 Secondary hyperparathyroidism of renal origin: Secondary | ICD-10-CM | POA: Diagnosis not present

## 2022-12-18 DIAGNOSIS — R809 Proteinuria, unspecified: Secondary | ICD-10-CM | POA: Diagnosis not present

## 2022-12-18 DIAGNOSIS — I2510 Atherosclerotic heart disease of native coronary artery without angina pectoris: Secondary | ICD-10-CM | POA: Diagnosis not present

## 2022-12-20 DIAGNOSIS — Z125 Encounter for screening for malignant neoplasm of prostate: Secondary | ICD-10-CM | POA: Diagnosis not present

## 2022-12-20 DIAGNOSIS — E559 Vitamin D deficiency, unspecified: Secondary | ICD-10-CM | POA: Diagnosis not present

## 2022-12-20 DIAGNOSIS — I10 Essential (primary) hypertension: Secondary | ICD-10-CM | POA: Diagnosis not present

## 2022-12-20 DIAGNOSIS — E1122 Type 2 diabetes mellitus with diabetic chronic kidney disease: Secondary | ICD-10-CM | POA: Diagnosis not present

## 2022-12-25 DIAGNOSIS — Z0000 Encounter for general adult medical examination without abnormal findings: Secondary | ICD-10-CM | POA: Diagnosis not present

## 2022-12-25 DIAGNOSIS — R911 Solitary pulmonary nodule: Secondary | ICD-10-CM | POA: Diagnosis not present

## 2022-12-25 DIAGNOSIS — I429 Cardiomyopathy, unspecified: Secondary | ICD-10-CM | POA: Diagnosis not present

## 2022-12-25 DIAGNOSIS — E1122 Type 2 diabetes mellitus with diabetic chronic kidney disease: Secondary | ICD-10-CM | POA: Diagnosis not present

## 2022-12-25 DIAGNOSIS — I700 Atherosclerosis of aorta: Secondary | ICD-10-CM | POA: Diagnosis not present

## 2022-12-25 DIAGNOSIS — K862 Cyst of pancreas: Secondary | ICD-10-CM | POA: Diagnosis not present

## 2022-12-25 DIAGNOSIS — D696 Thrombocytopenia, unspecified: Secondary | ICD-10-CM | POA: Diagnosis not present

## 2022-12-25 DIAGNOSIS — I5022 Chronic systolic (congestive) heart failure: Secondary | ICD-10-CM | POA: Diagnosis not present

## 2022-12-25 DIAGNOSIS — N2581 Secondary hyperparathyroidism of renal origin: Secondary | ICD-10-CM | POA: Diagnosis not present

## 2022-12-31 DIAGNOSIS — R972 Elevated prostate specific antigen [PSA]: Secondary | ICD-10-CM | POA: Diagnosis not present

## 2022-12-31 DIAGNOSIS — R350 Frequency of micturition: Secondary | ICD-10-CM | POA: Diagnosis not present

## 2022-12-31 DIAGNOSIS — N401 Enlarged prostate with lower urinary tract symptoms: Secondary | ICD-10-CM | POA: Diagnosis not present

## 2023-01-01 ENCOUNTER — Other Ambulatory Visit: Payer: Self-pay | Admitting: Thoracic Surgery (Cardiothoracic Vascular Surgery)

## 2023-01-01 DIAGNOSIS — R911 Solitary pulmonary nodule: Secondary | ICD-10-CM

## 2023-01-03 ENCOUNTER — Other Ambulatory Visit: Payer: Self-pay | Admitting: Urology

## 2023-01-03 DIAGNOSIS — R972 Elevated prostate specific antigen [PSA]: Secondary | ICD-10-CM

## 2023-01-10 ENCOUNTER — Encounter: Payer: Self-pay | Admitting: Thoracic Surgery (Cardiothoracic Vascular Surgery)

## 2023-01-22 ENCOUNTER — Ambulatory Visit
Admission: RE | Admit: 2023-01-22 | Discharge: 2023-01-22 | Disposition: A | Discharge status: Home / Self Care | Payer: Medicare PPO | Source: Ambulatory Visit | Attending: Thoracic Surgery (Cardiothoracic Vascular Surgery) | Admitting: Thoracic Surgery (Cardiothoracic Vascular Surgery)

## 2023-01-22 DIAGNOSIS — K862 Cyst of pancreas: Secondary | ICD-10-CM | POA: Diagnosis not present

## 2023-01-22 DIAGNOSIS — I700 Atherosclerosis of aorta: Secondary | ICD-10-CM | POA: Diagnosis not present

## 2023-01-22 DIAGNOSIS — R918 Other nonspecific abnormal finding of lung field: Secondary | ICD-10-CM | POA: Diagnosis not present

## 2023-01-22 DIAGNOSIS — R911 Solitary pulmonary nodule: Secondary | ICD-10-CM

## 2023-01-29 ENCOUNTER — Ambulatory Visit: Payer: Medicare PPO | Admitting: Thoracic Surgery (Cardiothoracic Vascular Surgery)

## 2023-01-29 VITALS — BP 130/85 | HR 65 | Resp 20 | Ht 67.0 in | Wt 191.0 lb

## 2023-01-29 DIAGNOSIS — R911 Solitary pulmonary nodule: Secondary | ICD-10-CM | POA: Diagnosis not present

## 2023-01-29 NOTE — Progress Notes (Signed)
[address]       [place] [address]             [phone]     HPI: Nathan Medina returns for follow-up of a groundglass opacity in his right upper lobe  Nathan Medina is a 72 year old male with a history of coronary disease, coronary bypass grafting x 3, ischemic cardiomyopathy, hypertension, dyslipidemia, type 2 diabetes, renal cell carcinoma status post ablation, and a right upper lobe groundglass opacity.  First noted to have a right upper lobe groundglass opacity in 2019.  Over time that has gotten larger but has never developed a solid component.  From scan to scan there is minimal change but over time it has definitely increased in size.  I saw him a year ago and recommended biopsy but he did not want to do so.  Therefore we will continue with radiographic follow-up.  In the interim since his last visit he has been feeling well.  Does not have any chest pain, pressure, or tightness.  No shortness of breath is of the ordinary.  No cough or fever.  Past Medical History:  Diagnosis Date   Abnormal liver function test 05/23/2011   ALLERGIC RHINITIS    ANXIETY    Aortic atherosclerosis (HCC)    Arthritis    Ascending aorta dilation (HCC) 07/24/2018   Bilateral renal cysts    Cervical spondylolysis    Moderate   CHF (congestive heart failure) (HCC) 07/24/2018   Chronic kidney disease    CKD stage 3 per office visit note 08/08/17 on chart    COLONIC POLYPS, HX OF    Coronary artery disease    DEPRESSION    DIABETES MELLITUS, TYPE II    Diverticulitis    Epidermal cyst    Fatty liver    GERD    GOUT    History of inguinal hernia    HOH (hard of hearing)    elft ear   HYPERLIPIDEMIA    HYPERTENSION    IBS    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, takedown and reversal September 2012   Ischemic cardiomyopathy 07/24/2018   Left renal mass 05/23/2011   Lumbar radicular pain 06/03/2011   Macular degeneration     right eye   Multinodular goiter    Myocardial infarction [facility])    silent MI in Pt's 30s   Pancreatic pseudocyst    Stable   Peripheral vascular disease (HCC)    Peritonsillar abscess    Pulmonary nodule    Right upper lobe   Radial neck fracture    Right upper lobe pulmonary nodule 07/24/2018   Thyroid disease    Thyroid nodule    Bilateral   Vertigo     Current Outpatient Medications  Medication Sig Dispense Refill   acetaminophen (TYLENOL) 500 MG tablet 1 tablet as needed     alendronate (FOSAMAX) 70 MG tablet Take 70 mg by mouth once a week. Take with a full glass of water on an empty stomach.     allopurinol (ZYLOPRIM) 300 MG tablet Take 1 tablet by mouth daily.     ALPRAZolam (XANAX) 0.5 MG tablet Take 1 tablet (0.5 mg total) by mouth daily as needed for sleep. 1/2 - 1 by mouth once daily as needed (Patient taking differently: Take 0.25-0.5 mg by mouth at bedtime as needed for sleep.) 90 tablet 2   aspirin EC 81 MG tablet Take 81 mg by  mouth every other day.      aspirin-acetaminophen-caffeine (EXCEDRIN EXTRA STRENGTH) 250-250-65 MG tablet 2 tablets as needed     atorvastatin (LIPITOR) 10 MG tablet Take 10 mg by mouth at bedtime.     carvedilol (COREG) 6.25 MG tablet Take 1 tablet (6.25 mg total) by mouth 2 (two) times daily. 180 tablet 3   Cholecalciferol (VITAMIN D-3) 25 MCG (1000 UT) CAPS Take 1,000 Units by mouth at bedtime.     dapagliflozin propanediol (FARXIGA) 5 MG TABS tablet Take 5 mg by mouth daily.     diphenhydrAMINE (BENADRYL) 25 MG tablet Take 25 mg by mouth at bedtime as needed for sleep.     ENTRESTO 49-51 MG Take 1 tablet by mouth twice daily 180 tablet 0   famotidine (PEPCID) 40 MG tablet Take 40 mg by mouth as needed for heartburn or indigestion.     furosemide (LASIX) 40 MG tablet Take 40 mg by mouth daily.     gabapentin (NEURONTIN) 100 MG capsule 1-3 capsules     guaiFENesin (MUCINEX) 600 MG 12 hr tablet Take 1,200 mg by mouth 2 (two) times daily as needed  for cough or to loosen phlegm.     insulin glargine (LANTUS) 100 unit/mL SOPN Inject 44 Units into the skin at bedtime.     levothyroxine (SYNTHROID) 175 MCG tablet Take 224 mcg by mouth daily before breakfast.     Multiple Vitamins-Minerals (PRESERVISION AREDS 2 PO) Take 1 tablet by mouth 2 (two) times daily.     omega-3 acid ethyl esters (LOVAZA) 1 g capsule Take 2 g by mouth 2 (two) times daily.      pantoprazole (PROTONIX) 40 MG tablet Take 40 mg by mouth every morning.     sildenafil (REVATIO) 20 MG tablet Take 100 mg by mouth daily as needed (ED).     traMADol (ULTRAM) 50 MG tablet Take 50 mg by mouth every 6 (six) hours as needed for severe pain.     triamcinolone cream (KENALOG) 0.1 % Apply 1 application topically 2 (two) times daily as needed (skin irritation).     TRULICITY 0.75 MG/0.5ML SOPN Inject 0.75 mg into the skin once a week. Monday     XARELTO 2.5 MG TABS tablet Take 1 tablet by mouth twice daily 180 tablet 0   No current facility-administered medications for this visit.    Physical Exam BP 130/85 (BP Location: Right Arm, Patient Position: Sitting)   Pulse 65   Resp 20   Ht 5\' 7"  (1.702 m)   Wt 191 lb (86.6 kg)   SpO2 97% Comment: RA  BMI 29.60 kg/m  72 year old man in no acute distress Alert and oriented x 3 with no focal deficits Lungs faint crackles at right base but otherwise clear Cardiac regular rate and rhythm with no murmur No peripheral edema  Diagnostic Tests: CT CHEST WITHOUT CONTRAST   TECHNIQUE: Multidetector CT imaging of the chest was performed following the standard protocol without IV contrast.   RADIATION DOSE REDUCTION: This exam was performed according to the departmental dose-optimization program which includes automated exposure control, adjustment of the mA and/or kV according to patient size and/or use of iterative reconstruction technique.   COMPARISON:  Chest x-ray dated January 17, 2022; abdominal MRI dated September 06, 2020    FINDINGS: Cardiovascular: Normal heart size. No pericardial effusion. Normal caliber thoracic aorta with moderate calcified plaque. Severe left main and three-vessel coronary artery calcifications status post CABG.   Mediastinum/Nodes: Esophagus unremarkable. Prior thyroidectomy.  No enlarged lymph nodes seen in the chest.   Lungs/Pleura: Central airways are patent. New ground-glass and irregular solid nodular opacities are seen in the right hemithorax. Previously described right upper lobe ground-glass nodule is stable measuring 2.4 x 2.3 cm, unchanged in size when compared with the prior exam and remeasured in similar plane. No pleural effusion or pneumothorax.   Upper Abdomen: Gallstones. Partially visualized simple appearing cyst of the right kidney, no specific follow-up imaging is necessary. Pancreatic tail lesion, measuring up to 2.7 cm, unchanged when compared with multiple prior exams.   Musculoskeletal: Prior median sternotomy. No aggressive appearing osseous lesions.   IMPRESSION: 1. New ground-glass and irregular solid nodular opacities of the right hemithorax, likely infectious or inflammatory. Recommend follow-up chest CT in 3 months to ensure resolution. 2. 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 Recommend attention on follow-up. 3. Stable cystic pancreatic tail lesion. See prior MRI of the abdomen dated September 06, 2020 for follow-up recommendations. 4. Aortic Atherosclerosis (ICD10-I70.0).     Electronically Signed   By: Allegra Lai M.D.   On: 01/22/2023 15:22 Stable pure groundglass opacity superior right upper lobe.  Multiple new solid and subsolid nodules in the right lung.  Aortic atherosclerosis.  Status post CABG.  Impression: Nathan Medina is a 72 year old male with a history of coronary disease, coronary bypass grafting x 3, ischemic cardiomyopathy,  hypertension, dyslipidemia, type 2 diabetes, renal cell carcinoma status post ablation, and a right upper lobe groundglass opacity.  Right upper lobe groundglass opacity-dates back to 2019.  Pure groundglass with no solid component.  Has slowly grown over time.  Very little change from scan to scan.  No change from his scan a year ago.  I advised him that I am still concerned this could be a low-grade malignancy and could at any point become invasive.  He wishes to continue with radiographic follow-up and does not desire any intervention.  Multiple new subsolid and solid nodules in the right lung primarily in the upper lobe.  Most likely infectious/ inflammatory in nature.  Will repeat a scan in 3 months to be on the safe side.  CAD-history of CABG.  No anginal symptoms.  Plan: Return in 3 months with CT chest  I spent over 20 minutes in review of records, images, and in consultation with Nathan Medina today Loreli Slot, MD [facility] [phone]

## 2023-01-30 ENCOUNTER — Encounter: Payer: Self-pay | Admitting: Hematology

## 2023-01-30 ENCOUNTER — Inpatient Hospital Stay: Payer: Medicare PPO

## 2023-01-30 ENCOUNTER — Inpatient Hospital Stay: Payer: Medicare PPO | Attending: Hematology | Admitting: Hematology

## 2023-01-30 ENCOUNTER — Other Ambulatory Visit: Payer: Self-pay | Admitting: *Deleted

## 2023-01-30 VITALS — BP 153/90 | HR 62 | Temp 97.5°F | Resp 18 | Ht 67.0 in | Wt 192.9 lb

## 2023-01-30 DIAGNOSIS — D696 Thrombocytopenia, unspecified: Secondary | ICD-10-CM | POA: Diagnosis not present

## 2023-01-30 DIAGNOSIS — Z7901 Long term (current) use of anticoagulants: Secondary | ICD-10-CM | POA: Diagnosis not present

## 2023-01-30 DIAGNOSIS — I509 Heart failure, unspecified: Secondary | ICD-10-CM | POA: Insufficient documentation

## 2023-01-30 LAB — VITAMIN B12: Vitamin B-12: 216 pg/mL (ref 180–914)

## 2023-01-30 LAB — CBC WITH DIFFERENTIAL (CANCER CENTER ONLY)
Abs Immature Granulocytes: 0.02 10*3/uL (ref 0.00–0.07)
Basophils Absolute: 0.1 10*3/uL (ref 0.0–0.1)
Basophils Relative: 1 %
Eosinophils Absolute: 0.8 10*3/uL — ABNORMAL HIGH (ref 0.0–0.5)
Eosinophils Relative: 13 %
HCT: 49.8 % (ref 39.0–52.0)
Hemoglobin: 16.6 g/dL (ref 13.0–17.0)
Immature Granulocytes: 0 %
Lymphocytes Relative: 34 %
Lymphs Abs: 2.1 10*3/uL (ref 0.7–4.0)
MCH: 32.1 pg (ref 26.0–34.0)
MCHC: 33.3 g/dL (ref 30.0–36.0)
MCV: 96.3 fL (ref 80.0–100.0)
Monocytes Absolute: 0.5 10*3/uL (ref 0.1–1.0)
Monocytes Relative: 8 %
Neutro Abs: 2.8 10*3/uL (ref 1.7–7.7)
Neutrophils Relative %: 44 %
Platelet Count: 119 10*3/uL — ABNORMAL LOW (ref 150–400)
RBC: 5.17 MIL/uL (ref 4.22–5.81)
RDW: 13.4 % (ref 11.5–15.5)
Smear Review: DECREASED
WBC Count: 6.3 10*3/uL (ref 4.0–10.5)
nRBC: 0 % (ref 0.0–0.2)

## 2023-01-30 LAB — IMMATURE PLATELET FRACTION: Immature Platelet Fraction: 4.4 % (ref 1.2–8.6)

## 2023-01-30 LAB — FOLATE: Folate: 30.4 ng/mL (ref >5.9)

## 2023-01-30 NOTE — Progress Notes (Signed)
[facility]   Telephone:(336) [phone] Fax:(336) [phone]   Clinic New Consult Note   Patient Care Team: Merri Brunette, MD as PCP - General (Internal Medicine)  Date of Service:  01/30/2023   CHIEF COMPLAINTS/PURPOSE OF CONSULTATION:  Thrombocytopenia [facility])   REFERRING PHYSICIAN:  Merri Brunette, MD   HISTORY OF PRESENTING ILLNESS:  Nathan Medina 72 y.o. male is a here because of Thrombocytopenia . The patient was referred by Merri Brunette, MD . The patient presents to the clinic today accompanied by a male friend.  Patient has had mild thrombocytopenia for several years.  According to our epic records, his platelet count has been in the range of 120-150K since 2009, with intermittent normal platelet.  His WBC and hemoglobin has been normal.  No significant episodes of bleeding.  On his routine physical with his PCP Dr. Renne Crigler in August 2024, labs CBC showed platelet count 100 9K, and normal WBC and hemoglobin.  He was referred to [place] for further evaluation.   He has multiple medical comorbidities, diabetes, hypertension, coronary artery disease status post bypass surgery in 2019.  He has congestive heart failure, for which he is on Eliquis, and Entresto.  He denies alcohol drinking, no history of liver disease, hepatitis or HIV.    He has a PMHx of.... Depression Anxiety Arthritis Diabetes Mellitus GERD Hypertension Myocardial Infarction Thyroid Disease CHF Kidney Disease    Socially... Single Social drinker retired  REVIEW OF SYSTEMS:   Constitutional: (-)Denies fevers, chills or abnormal night sweats Eyes:(-) Denies blurriness of vision, double vision or watery eyes Ears, nose, mouth, throat, and face: Denies mucositis or sore throat Respiratory: (-)Denies cough, dyspnea or wheezes Cardiovascular: (-)Denies palpitation, chest discomfort or lower extremity swelling Gastrointestinal: (-) Denies nausea, heartburn or change in bowel habits Skin: (-)  Denies abnormal skin rashes Lymphatics: (-) Denies new lymphadenopathy or easy bruising Neurological:(-) Denies numbness, tingling or new weaknesses Behavioral/Psych: (-) Mood is stable, no new changes  All other systems were reviewed with the patient and are negative.   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   Bilateral renal cysts    Cervical spondylolysis    Moderate   CHF (congestive heart failure) (HCC) 07/24/2018   Chronic kidney disease    CKD stage 3 per office visit note 08/08/17 on chart    COLONIC POLYPS, HX OF    Coronary artery disease    DEPRESSION    DIABETES MELLITUS, TYPE II    Diverticulitis    Epidermal cyst    Fatty liver    GERD    GOUT    History of inguinal hernia    HOH (hard of hearing)    elft ear   HYPERLIPIDEMIA    HYPERTENSION    IBS    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, takedown and reversal September 2012   Ischemic cardiomyopathy 07/24/2018   Left renal mass 05/23/2011   Lumbar radicular pain 06/03/2011   Macular degeneration    right eye   Multinodular goiter    Myocardial infarction [facility])    silent MI in Pt's 30s   Pancreatic pseudocyst    Stable   Peripheral vascular disease (HCC)    Peritonsillar abscess    Pulmonary nodule  Right upper lobe   Radial neck fracture    Right upper lobe pulmonary nodule 07/24/2018   Thyroid disease    Thyroid nodule    Bilateral   Vertigo     SURGICAL HISTORY: Past Surgical History:  Procedure Laterality Date   APPENDECTOMY  02/12/11   COLON SURGERY  08/11/10   sigmoid colectomy   COLON SURGERY  02/12/11   colostomy takedown   COLONOSCOPY     CORONARY ARTERY BYPASS GRAFT N/A 04/01/2018   Procedure: CORONARY ARTERY BYPASS GRAFTING (CABG) x Three, using left internal  mammary artery and right leg greater saphenous vein harvested endoscopically;  Surgeon: Delight Ovens, MD;  Location: [facility] OR;  Service: Open Heart Surgery;  Laterality: N/A;   CYST REMOVAL TRUNK Right 02/20/2018   Procedure: epidermal cyst excision right lower back;  Surgeon: Darnell Level, MD;  Location: [facility] ORS;  Service: General;  Laterality: Right;   HERNIA REPAIR     INSERTION OF MESH N/A 08/20/2012   Procedure: INSERTION OF MESH;  Surgeon: Robyne Askew, MD;  Location: [facility] ORS;  Service: General;  Laterality: N/A;   IR RADIOLOGIST EVAL & MGMT  03/23/2020   IR RADIOLOGIST EVAL & MGMT  07/26/2020   IR RADIOLOGIST EVAL & MGMT  11/01/2020   IR RADIOLOGIST EVAL & MGMT  05/10/2021   KNEE ARTHROSCOPY Right 10/18/2019   LEFT HEART CATH AND CORONARY ANGIOGRAPHY N/A 01/14/2018   Procedure: LEFT HEART CATH AND CORONARY ANGIOGRAPHY;  Surgeon: Elder Negus, MD;  Location: [facility] INV[facility] LAB;  Service: Cardiovascular;  Laterality: N/A;   LYSIS OF ADHESION  08/20/2012   Procedure: LYSIS OF ADHESION;  Surgeon: Robyne Askew, MD;  Location: [facility] ORS;  Service: General;;   QUADRICEPS TENDON REPAIR Right 10/20/2019   Procedure: REPAIR RIGHT QUADRICEP TENDON;  Surgeon: Durene Romans, MD;  Location: [facility] OR;  Service: Orthopedics;  Laterality: Right;   RADIOLOGY WITH ANESTHESIA N/A 06/22/2020   Procedure: CT WITH ANESTHESIA  CRYOABLATION;  Surgeon: Berdine Dance, MD;  Location: [facility] ORS;  Service: Anesthesiology;  Laterality: N/A;   RADIOLOGY WITH ANESTHESIA N/A 07/06/2020   Procedure: CT WITH ANESTHESIA;  Surgeon: Berdine Dance, MD;  Location: [facility];  Service: Anesthesiology;  Laterality: N/A;   SEPTOPLASTY  age 52   TEE WITHOUT CARDIOVERSION N/A 04/01/2018   Procedure: TRANSESOPHAGEAL ECHOCARDIOGRAM (TEE);  Surgeon: Delight Ovens, MD;  Location: [facility] OR;  Service: Open Heart Surgery;  Laterality: N/A;   THYROIDECTOMY N/A 10/17/2017   Procedure: TOTAL THYROIDECTOMY;  Surgeon: Darnell Level, MD;  Location: [facility]  ORS;  Service: General;  Laterality: N/A;   TONSILLECTOMY Right 08/01/2016   Procedure: INCISION AND DRAINAGE RIGHT PERI-TONSILLAR ABSCESS;  Surgeon: Flo Shanks, MD;  Location: [facility];  Service: ENT;  Laterality: Right;   ULTRASOUND GUIDANCE FOR VASCULAR ACCESS  01/14/2018   Procedure: Ultrasound Guidance For Vascular Access;  Surgeon: Elder Negus, MD;  Location: [facility] CV LAB;  Service: Cardiovascular;;   VENTRAL HERNIA REPAIR  08/20/2012   Procedure: HERNIA REPAIR VENTRAL ADULT;  Surgeon: Robyne Askew, MD;  Location: [facility] ORS;  Service: General;;    SOCIAL HISTORY: Social History   Socioeconomic History   Marital status: Single    Spouse name: Not on file   Number of children: 0   Years of education: Not on file   Highest education level: Not on file  Occupational History   Not on file  Tobacco Use   Smoking status: Never  Smokeless tobacco: Never  Vaping Use   Vaping status: Never Used  Substance and Sexual Activity   Alcohol use: Yes    Comment: occ   Drug use: No   Sexual activity: Not on file  Other Topics Concern   Not on file  Social History Narrative   Not on file   Social Determinants of Health   Financial Resource Strain: Not on file  Food Insecurity: Not on file  Transportation Needs: Not on file  Physical Activity: Not on file  Stress: Not on file  Social Connections: Not on file  Intimate Partner Violence: Not on file    FAMILY HISTORY: Family History  Problem Relation Age of Onset   Hypertension Paternal Aunt    Stroke Maternal Grandmother    Hypertension Maternal Grandmother     ALLERGIES:  is allergic to ciprofloxacin, escitalopram oxalate, quinapril hcl, and sulfa antibiotics.  MEDICATIONS:  Current Outpatient Medications  Medication Sig Dispense Refill   acetaminophen (TYLENOL) 500 MG tablet 1 tablet as needed     alendronate (FOSAMAX) 70 MG tablet Take 70 mg by mouth once a week. Take with a full glass of water on an empty  stomach.     allopurinol (ZYLOPRIM) 300 MG tablet Take 1 tablet by mouth daily.     ALPRAZolam (XANAX) 0.5 MG tablet Take 1 tablet (0.5 mg total) by mouth daily as needed for sleep. 1/2 - 1 by mouth once daily as needed (Patient taking differently: Take 0.25-0.5 mg by mouth at bedtime as needed for sleep.) 90 tablet 2   aspirin EC 81 MG tablet Take 81 mg by mouth every other day.      aspirin-acetaminophen-caffeine (EXCEDRIN EXTRA STRENGTH) 250-250-65 MG tablet 2 tablets as needed     atorvastatin (LIPITOR) 10 MG tablet Take 10 mg by mouth at bedtime.     carvedilol (COREG) 6.25 MG tablet Take 1 tablet (6.25 mg total) by mouth 2 (two) times daily. 180 tablet 3   Cholecalciferol (VITAMIN D-3) 25 MCG (1000 UT) CAPS Take 1,000 Units by mouth at bedtime.     dapagliflozin propanediol (FARXIGA) 5 MG TABS tablet Take 5 mg by mouth daily.     diphenhydrAMINE (BENADRYL) 25 MG tablet Take 25 mg by mouth at bedtime as needed for sleep.     ENTRESTO 49-51 MG Take 1 tablet by mouth twice daily 180 tablet 0   famotidine (PEPCID) 40 MG tablet Take 40 mg by mouth as needed for heartburn or indigestion.     furosemide (LASIX) 40 MG tablet Take 40 mg by mouth daily.     gabapentin (NEURONTIN) 100 MG capsule 1-3 capsules     guaiFENesin (MUCINEX) 600 MG 12 hr tablet Take 1,200 mg by mouth 2 (two) times daily as needed for cough or to loosen phlegm.     insulin glargine (LANTUS) 100 unit/mL SOPN Inject 44 Units into the skin at bedtime.     levothyroxine (SYNTHROID) 175 MCG tablet Take 224 mcg by mouth daily before breakfast.     Multiple Vitamins-Minerals (PRESERVISION AREDS 2 PO) Take 1 tablet by mouth 2 (two) times daily.     omega-3 acid ethyl esters (LOVAZA) 1 g capsule Take 2 g by mouth 2 (two) times daily.      pantoprazole (PROTONIX) 40 MG tablet Take 40 mg by mouth every morning.     sildenafil (REVATIO) 20 MG tablet Take 100 mg by mouth daily as needed (ED).     traMADol (ULTRAM) 50 MG  tablet Take 50 mg  by mouth every 6 (six) hours as needed for severe pain.     triamcinolone cream (KENALOG) 0.1 % Apply 1 application topically 2 (two) times daily as needed (skin irritation).     TRULICITY 0.75 MG/0.5ML SOPN Inject 0.75 mg into the skin once a week. Monday     XARELTO 2.5 MG TABS tablet Take 1 tablet by mouth twice daily 180 tablet 0   No current facility-administered medications for this visit.    PHYSICAL EXAMINATION: ECOG PERFORMANCE STATUS: 1 - Symptomatic but completely ambulatory  Vitals:   01/30/23 1521 01/30/23 1522  BP: (!) 144/92 (!) 153/90  Pulse: 62   Resp: 18   Temp: (!) 97.5 F (36.4 C)   SpO2: 100%    Filed Weights   01/30/23 1521  Weight: 192 lb 14.4 oz (87.5 kg)     GENERAL:alert, no distress and comfortable SKIN: skin color normal, no rashes or significant lesions EYES: normal, Conjunctiva are pink and non-injected, sclera clear  NEURO: alert & oriented x 3 with fluent speech NECK: (-) supple, thyroid normal size, non-tender, without nodularity LYMPH: (-) no palpable lymphadenopathy in the cervical, axillary  LUNGS: (-) clear to auscultation and percussion with normal breathing effort HEART: (-) regular rate & rhythm and no murmurs and(-) no lower extremity edema ABDOMEN:(-) abdomen soft, (-) non-tender and (-) normal bowel sounds  LABORATORY DATA:  I have reviewed the data as listed    Latest Ref Rng & Units 01/30/2023    3:50 PM 07/07/2020    8:10 AM 07/06/2020   10:15 AM  CBC  WBC 4.0 - 10.5 K/uL 6.3  11.7  6.0   Hemoglobin 13.0 - 17.0 g/dL 47.4  25.9  56.3   Hematocrit 39.0 - 52.0 % 49.8  44.6  49.1   Platelets 150 - 400 K/uL 119  134  132        Latest Ref Rng & Units 04/30/2022    9:01 AM 07/07/2020    8:10 AM 07/06/2020   10:15 AM  CMP  Glucose 70 - 99 mg/dL  875  643   BUN 8 - 23 mg/dL  39  29   Creatinine 3.29 - 1.24 mg/dL 5.18  8.41  6.60   Sodium 135 - 145 mmol/L  138  143   Potassium 3.5 - 5.1 mmol/L  4.4  3.8   Chloride 98 - 111  mmol/L  104  106   CO2 22 - 32 mmol/L  25  26   Calcium 8.9 - 10.3 mg/dL  8.6  9.1      RADIOGRAPHIC STUDIES: I have personally reviewed the radiological images as listed and agreed with the findings in the report. CT Chest Wo Contrast  Result Date: 01/22/2023 CLINICAL DATA:  Pulmonary nodule follow-up EXAM: CT CHEST WITHOUT CONTRAST TECHNIQUE: Multidetector CT imaging of the chest was performed following the standard protocol without IV contrast. RADIATION DOSE REDUCTION: This exam was performed according to the departmental dose-optimization program which includes automated exposure control, adjustment of the mA and/or kV according to patient size and/or use of iterative reconstruction technique. COMPARISON:  Chest x-ray dated January 17, 2022; abdominal MRI dated September 06, 2020 FINDINGS: Cardiovascular: Normal heart size. No pericardial effusion. Normal caliber thoracic aorta with moderate calcified plaque. Severe left main and three-vessel coronary artery calcifications status post CABG. Mediastinum/Nodes: Esophagus unremarkable. Prior thyroidectomy. No enlarged lymph nodes seen in the chest. Lungs/Pleura: Central airways are patent. New ground-glass and irregular solid  nodular opacities are seen in the right hemithorax. Previously described right upper lobe ground-glass nodule is stable measuring 2.4 x 2.3 cm, unchanged in size when compared with the prior exam and remeasured in similar plane. No pleural effusion or pneumothorax. Upper Abdomen: Gallstones. Partially visualized simple appearing cyst of the right kidney, no specific follow-up imaging is necessary. Pancreatic tail lesion, measuring up to 2.7 cm, unchanged when compared with multiple prior exams. Musculoskeletal: Prior median sternotomy. No aggressive appearing osseous lesions. IMPRESSION: 1. New ground-glass and irregular solid nodular opacities of the right hemithorax, likely infectious or inflammatory. Recommend follow-up chest CT in 3  months to ensure resolution. 2. 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 Recommend attention on follow-up. 3. Stable cystic pancreatic tail lesion. See prior MRI of the abdomen dated September 06, 2020 for follow-up recommendations. 4. Aortic Atherosclerosis (ICD10-I70.0). Electronically Signed   By: Allegra Lai M.D.   On: 01/22/2023 15:22    ASSESSMENT & PLAN:  Nathan Medina is a 72 y.o.  male with a history of    1.Thrombocytopenia (HCC)  -He has had a mild intermittent thrombocytopenia since 2009, with platelet in the range of 100-150K -I discussed the common etiology for chronic mild thrombocytopenia, which include autoimmune related (ITP), liver disease, splenomegaly, medication or alcohol induced, chronic infection such as hepatitis C and HIV, and bone marrow disease such as MDS. The other etiology such as infection, malignancy, microangiopathy, DIC a less likely given the indolent course and his clinical presentation. -I reviewed his medication list.  He is on Xarelto for his heart disease, which will increase his risk of bleeding if his platelet count dropped further, especially below 50K. -He does no drink alcohol or have no history of liver disease.  However his recent CT scan from 2023 did show slightly enlarged spleen. -I'll check immature platelet count, hepatitis B, hepatitis C, folic acid and B12 level  -If the above workup is negative, this is likely ITP. -We discussed the risk of bleeding from thrombocytopenia. Giving the mild degree of thrombocytopenia, the risk of bleeding is not very high. But he knows to avoid injury.  -I did not think he needs a bone marrow biopsy.  -Will monitor his blood counts.    PLAN:  -I reviewed his lab results  -will do lab today  - I recommend monitoring CBC every 4 months at his PCP office  -f/u with in 1 year.  Orders Placed This Encounter   Procedures   Vitamin B12    Standing Status:   Future    Number of Occurrences:   1    Standing Expiration Date:   01/30/2024   Folate, Serum    Standing Status:   Future    Number of Occurrences:   1    Standing Expiration Date:   01/30/2024   Hepatitis panel, acute    Standing Status:   Future    Number of Occurrences:   1    Standing Expiration Date:   01/30/2024   Immature Platelet Fraction    Standing Status:   Future    Number of Occurrences:   1    Standing Expiration Date:   01/30/2024   Methylmalonic acid, serum    Standing Status:   Future    Number of Occurrences:   1    Standing Expiration Date:   01/30/2024   CBC with Differential (Cancer Center Only)  Standing Status:   Future    Number of Occurrences:   1    Standing Expiration Date:   01/30/2024    All questions were answered. The patient knows to call the clinic with any problems, questions or concerns. The total time spent in the appointment was 30 minutes.     Malachy Mood, MD 01/30/2023 4:47 PM  I, Monica Martinez, am acting as scribe for Malachy Mood, MD.   I have reviewed the above documentation for accuracy and completeness, and I agree with the above.

## 2023-01-31 LAB — HEPATITIS PANEL, ACUTE
HCV Ab: NONREACTIVE
Hep A IgM: NONREACTIVE
Hep B C IgM: NONREACTIVE
Hepatitis B Surface Ag: NONREACTIVE

## 2023-01-31 NOTE — Progress Notes (Signed)
Dr. Latanya Maudlin office note from 01/30/2023 sent to pt's PCP Dr. Renne Crigler.  Fax confirmation received.

## 2023-02-01 ENCOUNTER — Telehealth: Payer: Self-pay | Admitting: Hematology

## 2023-02-03 LAB — METHYLMALONIC ACID, SERUM: Methylmalonic Acid, Quantitative: 731 nmol/L — ABNORMAL HIGH (ref 0–378)

## 2023-02-05 DIAGNOSIS — R198 Other specified symptoms and signs involving the digestive system and abdomen: Secondary | ICD-10-CM | POA: Diagnosis not present

## 2023-02-05 DIAGNOSIS — R140 Abdominal distension (gaseous): Secondary | ICD-10-CM | POA: Diagnosis not present

## 2023-02-06 DIAGNOSIS — R9389 Abnormal findings on diagnostic imaging of other specified body structures: Secondary | ICD-10-CM | POA: Diagnosis not present

## 2023-02-20 ENCOUNTER — Encounter: Payer: Self-pay | Admitting: Cardiology

## 2023-02-20 ENCOUNTER — Ambulatory Visit: Payer: Medicare PPO | Admitting: Cardiology

## 2023-02-20 ENCOUNTER — Ambulatory Visit: Payer: Medicare PPO | Attending: Cardiology | Admitting: Cardiology

## 2023-02-20 ENCOUNTER — Other Ambulatory Visit (HOSPITAL_COMMUNITY): Payer: Self-pay | Admitting: Urology

## 2023-02-20 VITALS — BP 132/80 | HR 84 | Resp 16 | Ht 67.0 in | Wt 193.8 lb

## 2023-02-20 DIAGNOSIS — R972 Elevated prostate specific antigen [PSA]: Secondary | ICD-10-CM

## 2023-02-20 DIAGNOSIS — I5020 Unspecified systolic (congestive) heart failure: Secondary | ICD-10-CM | POA: Diagnosis not present

## 2023-02-20 DIAGNOSIS — I25810 Atherosclerosis of coronary artery bypass graft(s) without angina pectoris: Secondary | ICD-10-CM | POA: Diagnosis not present

## 2023-02-20 NOTE — Patient Instructions (Signed)
Medication Instructions:   *If you need a refill on your cardiac medications before your next appointment, please call your pharmacy*   Lab Work:  If you have labs (blood work) drawn today and your tests are completely normal, you will receive your results only by: MyChart Message (if you have MyChart) OR A paper copy in the mail If you have any lab test that is abnormal or we need to change your treatment, we will call you to review the results.   Testing/Procedures:    Follow-Up: At [facility], you and your health needs are our priority.  As part of our continuing mission to provide you with exceptional heart care, we have created designated Provider Care Teams.  These Care Teams include your primary Cardiologist (physician) and Advanced Practice Providers (APPs -  Physician Assistants and Nurse Practitioners) who all work together to provide you with the care you need, when you need it.  We recommend signing up for the patient portal called "MyChart".  Sign up information is provided on this After Visit Summary.  MyChart is used to connect with patients for Virtual Visits (Telemedicine).  Patients are able to view lab/test results, encounter notes, upcoming appointments, etc.  Non-urgent messages can be sent to your provider as well.   To learn more about what you can do with MyChart, go to [email or web address].    Your next appointment:   1 year(s)  Provider:   Dr Rosemary Holms    Other Instructions

## 2023-02-20 NOTE — Progress Notes (Signed)
Cardiology Office Note:  .   Date:  02/20/2023  ID:  Nathan Medina, DOB Jul 17, 1950, MRN [id number] PCP: Merri Brunette, MD  [facility] Providers Cardiologist:  Truett Mainland, MD PCP: Merri Brunette, MD  Chief Complaint:  Chief Complaint  Patient presents with   Coronary artery disease involving coronary bypass graft of    Follow-up    1 year      History of Present Illness: .   Nathan Medina is a 72 y.o. male with hypertension, CKD stage III, and 2 diabetes mellitus, ischemic cardiomyopathy with recovered EF, coronary artery disease s/p CABGX3 (LIMA-LAD, SVG-dRCA, SVG-ramus) by Dr. Tyrone Sage on 04/01/2018, stable renal and lung nodules followed by specialists, s/p thyroidectomy, stable thrombocytopenia  Patient is doing well, denies any chest pain shortness of breath symptoms.  He is exercising regularly with his personal trainer without any difficulty.  Patient sees Dr. Dorris Fetch for follow-up of right upper lobe groundglass opacity.  This has changed a little when compared [place] scan, Dr. Dorris Fetch remains concerned that this could be a low-grade malignancy and could become invasive at any point.  Therefore, he recommended 62-month close follow-up.  Vitals:   02/20/23 1004  BP: 132/80  Pulse: 84  Resp: 16  SpO2: 96%     ROS:  Review of Systems  Cardiovascular:  Negative for chest pain, dyspnea on exertion, leg swelling, palpitations and syncope.     Studies Reviewed: .      Jul-Sept 2024: Glucose NA, BUN/Cr 32/1.5. EGFR 49. K 4.0 Hb 16.6 HbA1C 6.4% Chol 75, TG 73, HDL 34, LDL 25  03/2022: TSH 2.7 normal  EKG 02/20/2023: Normal sinus rhythm Left axis deviation Low voltage QRS Inferior infarct (cited on or before 22-Jun-2020) Possible Anterolateral infarct (cited on or before 22-Jun-2020) When compared with ECG of 22-Jun-2020 09:44, Inverted T waves have replaced nonspecific T wave abnormality in Anterolateral leads    Echocardiogram  02/06/2022:  Normal LV systolic function with visual EF 50-55%. Left ventricle cavity  is normal in size. Mild concentric hypertrophy of the left ventricle.  Abnormal septal wall motion due to post-operative coronary artery bypass  graft. Doppler evidence of grade I (impaired) diastolic dysfunction,  normal LAP. Left ventricle regional wall motion findings: Apical anterior,  Apical inferior, Apical septal and Apical cap hypokinesis. Calculated EF  53%.  Structurally normal trileaflet aortic valve.  Trace aortic regurgitation.  Structurally normal tricuspid valve with trace regurgitation. No evidence  of pulmonary hypertension.  EF improved compared to 01/2021 however there appears to be new apical  hypokinesis, would repeat with contrast agent.    Physical Exam:   Physical Exam Vitals and nursing note reviewed.  Constitutional:      General: He is not in acute distress. Neck:     Vascular: No JVD.  Cardiovascular:     Rate and Rhythm: Normal rate and regular rhythm.     Heart sounds: Normal heart sounds. No murmur heard. Pulmonary:     Effort: Pulmonary effort is normal.     Breath sounds: Normal breath sounds. No wheezing or rales.  Musculoskeletal:     Right lower leg: Edema (Trace, likely due to venous insufficiency) present.     Left lower leg: Edema (Trace, likely due to venous insufficiency) present.      VISIT DIAGNOSES:   ICD-10-CM   1. Coronary artery disease involving coronary bypass graft of native heart without angina pectoris  I25.810 EKG 12-Lead    2. HFrEF (heart failure with  reduced ejection fraction) (HCC)  I50.20        ASSESSMENT AND PLAN: .    Nathan Medina is a 72 y.o. male with hypertension, CKD stage III, and 2 diabetes mellitus, ischemic cardiomyopathy with recovered EF, coronary artery disease s/p CABGX3 (LIMA-LAD, SVG-dRCA, SVG-ramus) by Dr. Tyrone Sage on 04/01/2018, stable renal and lung nodules followed by specialists, s/p thyroidectomy, stable  thrombocytopenia  Ischemic cardiomyopathy: Recovered EF 50-55% (12/2019).  Reviewed and compared with previous echocardiograms. Apical akinesis is stable and unchanged.  Currently on Entresto to 49-51 mg bid, coreg 6.25 mg bid, Farxiga 10 mg daily. Continue lasix 40 mg every other day.   CAD: S/p CABG. No angina symptoms. Continue aspirin to 81 mg (patient takes it every other day), Xarelto 2.5 mg bid.   Dilated aortic root: Mild, stable Has follow-up with Dr. Dareen Piano, he also sees for a lung nodule.   Postop Afib: No recurrence. Will monitor.   Hyperlipidemia: Well controlled. TG have also improved. Continue lipitor 10 mg daily.    Thrombocytopenia: Mild, stable. No bleeding. Continue f/u w/PCP.    Management of DM, CKD III, renal and lung nodules as per PCP and other specialists.     F/u in 1 year  Signed, Elder Negus, MD

## 2023-02-21 ENCOUNTER — Ambulatory Visit (HOSPITAL_COMMUNITY)
Admission: RE | Admit: 2023-02-21 | Discharge: 2023-02-21 | Disposition: A | Discharge status: Home / Self Care | Payer: Medicare PPO | Source: Ambulatory Visit | Attending: Urology | Admitting: Urology

## 2023-02-21 ENCOUNTER — Other Ambulatory Visit: Payer: Medicare PPO

## 2023-02-21 ENCOUNTER — Encounter (HOSPITAL_COMMUNITY): Payer: Self-pay

## 2023-02-21 DIAGNOSIS — R972 Elevated prostate specific antigen [PSA]: Secondary | ICD-10-CM

## 2023-02-24 ENCOUNTER — Other Ambulatory Visit: Payer: Self-pay | Admitting: Hematology

## 2023-02-24 DIAGNOSIS — E538 Deficiency of other specified B group vitamins: Secondary | ICD-10-CM

## 2023-02-25 ENCOUNTER — Telehealth: Payer: Self-pay

## 2023-02-25 ENCOUNTER — Telehealth: Payer: Self-pay | Admitting: Hematology

## 2023-02-25 NOTE — Telephone Encounter (Signed)
Notified the pt of the message below. Pt verbalized understanding.  Roston Grunewald M,CMA

## 2023-02-25 NOTE — Telephone Encounter (Signed)
-----   Message from Malachy Mood sent at 02/24/2023  9:54 PM EDT ----- Please let pt know his B12 level is mildly low, and I recommend oral B12 daily (OTC) and repeat lab in 3 months with phone visit one week after, thx   Malachy Mood

## 2023-02-27 ENCOUNTER — Ambulatory Visit (HOSPITAL_COMMUNITY)
Admission: RE | Admit: 2023-02-27 | Discharge: 2023-02-27 | Disposition: A | Discharge status: Home / Self Care | Payer: Medicare PPO | Source: Ambulatory Visit | Attending: Urology | Admitting: Urology

## 2023-02-27 DIAGNOSIS — R972 Elevated prostate specific antigen [PSA]: Secondary | ICD-10-CM | POA: Insufficient documentation

## 2023-02-27 DIAGNOSIS — N400 Enlarged prostate without lower urinary tract symptoms: Secondary | ICD-10-CM | POA: Diagnosis not present

## 2023-02-27 DIAGNOSIS — N4289 Other specified disorders of prostate: Secondary | ICD-10-CM | POA: Diagnosis not present

## 2023-02-27 DIAGNOSIS — K5730 Diverticulosis of large intestine without perforation or abscess without bleeding: Secondary | ICD-10-CM | POA: Diagnosis not present

## 2023-02-27 MED ORDER — GADOBUTROL 1 MMOL/ML IV SOLN
9.0000 mL | Freq: Once | INTRAVENOUS | Status: AC | PRN
  Administered 2023-02-27: 9 mL via INTRAVENOUS

## 2023-03-11 DIAGNOSIS — R972 Elevated prostate specific antigen [PSA]: Secondary | ICD-10-CM | POA: Diagnosis not present

## 2023-03-18 DIAGNOSIS — R350 Frequency of micturition: Secondary | ICD-10-CM | POA: Diagnosis not present

## 2023-03-18 DIAGNOSIS — R972 Elevated prostate specific antigen [PSA]: Secondary | ICD-10-CM | POA: Diagnosis not present

## 2023-03-18 DIAGNOSIS — N401 Enlarged prostate with lower urinary tract symptoms: Secondary | ICD-10-CM | POA: Diagnosis not present

## 2023-03-19 ENCOUNTER — Other Ambulatory Visit: Payer: Self-pay | Admitting: Cardiology

## 2023-03-28 DIAGNOSIS — H43813 Vitreous degeneration, bilateral: Secondary | ICD-10-CM | POA: Diagnosis not present

## 2023-03-28 DIAGNOSIS — Z961 Presence of intraocular lens: Secondary | ICD-10-CM | POA: Diagnosis not present

## 2023-03-28 DIAGNOSIS — E119 Type 2 diabetes mellitus without complications: Secondary | ICD-10-CM | POA: Diagnosis not present

## 2023-03-28 DIAGNOSIS — H353132 Nonexudative age-related macular degeneration, bilateral, intermediate dry stage: Secondary | ICD-10-CM | POA: Diagnosis not present

## 2023-03-28 DIAGNOSIS — H33311 Horseshoe tear of retina without detachment, right eye: Secondary | ICD-10-CM | POA: Diagnosis not present

## 2023-04-02 ENCOUNTER — Other Ambulatory Visit: Payer: Self-pay | Admitting: Thoracic Surgery (Cardiothoracic Vascular Surgery)

## 2023-04-02 DIAGNOSIS — R911 Solitary pulmonary nodule: Secondary | ICD-10-CM

## 2023-04-08 ENCOUNTER — Encounter: Payer: Self-pay | Admitting: Thoracic Surgery (Cardiothoracic Vascular Surgery)

## 2023-04-11 DIAGNOSIS — E1122 Type 2 diabetes mellitus with diabetic chronic kidney disease: Secondary | ICD-10-CM | POA: Diagnosis not present

## 2023-04-11 DIAGNOSIS — Z85850 Personal history of malignant neoplasm of thyroid: Secondary | ICD-10-CM | POA: Diagnosis not present

## 2023-04-11 DIAGNOSIS — M810 Age-related osteoporosis without current pathological fracture: Secondary | ICD-10-CM | POA: Diagnosis not present

## 2023-04-11 DIAGNOSIS — Z794 Long term (current) use of insulin: Secondary | ICD-10-CM | POA: Diagnosis not present

## 2023-04-11 DIAGNOSIS — E890 Postprocedural hypothyroidism: Secondary | ICD-10-CM | POA: Diagnosis not present

## 2023-04-30 ENCOUNTER — Ambulatory Visit
Admission: RE | Admit: 2023-04-30 | Discharge: 2023-04-30 | Disposition: A | Discharge status: Home / Self Care | Payer: Medicare PPO | Source: Ambulatory Visit | Attending: Thoracic Surgery (Cardiothoracic Vascular Surgery) | Admitting: Thoracic Surgery (Cardiothoracic Vascular Surgery)

## 2023-04-30 DIAGNOSIS — K862 Cyst of pancreas: Secondary | ICD-10-CM | POA: Diagnosis not present

## 2023-04-30 DIAGNOSIS — R911 Solitary pulmonary nodule: Secondary | ICD-10-CM

## 2023-04-30 DIAGNOSIS — K8020 Calculus of gallbladder without cholecystitis without obstruction: Secondary | ICD-10-CM | POA: Diagnosis not present

## 2023-04-30 DIAGNOSIS — Z85850 Personal history of malignant neoplasm of thyroid: Secondary | ICD-10-CM | POA: Diagnosis not present

## 2023-04-30 DIAGNOSIS — R918 Other nonspecific abnormal finding of lung field: Secondary | ICD-10-CM | POA: Diagnosis not present

## 2023-04-30 DIAGNOSIS — I700 Atherosclerosis of aorta: Secondary | ICD-10-CM | POA: Diagnosis not present

## 2023-05-07 ENCOUNTER — Ambulatory Visit: Payer: Medicare PPO | Admitting: Thoracic Surgery (Cardiothoracic Vascular Surgery)

## 2023-05-07 VITALS — BP 124/79 | HR 63 | Resp 20 | Ht 67.0 in | Wt 200.0 lb

## 2023-05-07 DIAGNOSIS — R911 Solitary pulmonary nodule: Secondary | ICD-10-CM

## 2023-05-07 NOTE — Progress Notes (Signed)
[address]       [place] [address]             [phone]     HPI: Nathan Medina returns for a scheduled follow-up visit   Nathan Medina is a 72 year old man with a past medical history significant for CAD, CABG x3, ischemic cardiomyopathy, hypertension, dyslipidemia, type 2 diabetes, renal cell carcinoma left kidney status post ablation, and a right upper lobe lung nodule.   He has been followed for a groundglass opacity in the right upper lobe that was first noted in 2019.  There has been minimal change from scan to scan but when looking back over multiple years there has been definite growth over time.    I saw him in September.  The groundglass opacity was stable, but he had multiple new mixed density nodules throughout the right upper lobe, which were  suspicious for infectious or inflammatory nodules.  He now returns a short-term interval follow-up of those nodules.  He has been feeling well.  Denies any chest pain, pressure, tightness, shortness of breath, cough, wheezing, change in appetite or weight loss.   He has had a pancreatic mass.  He had an MRI that showed that was consistent with a benign pseudocyst.  That is being followed elsewhere.  Past Medical History:  Diagnosis Date   Abnormal liver function test 05/23/2011   ALLERGIC RHINITIS    ANXIETY    Aortic atherosclerosis (HCC)    Arthritis    Ascending aorta dilation (HCC) 07/24/2018   Bilateral renal cysts    Cervical spondylolysis    Moderate   CHF (congestive heart failure) (HCC) 07/24/2018   Chronic kidney disease    CKD stage 3 per office visit note 08/08/17 on chart    COLONIC POLYPS, HX OF    Coronary artery disease    DEPRESSION    DIABETES MELLITUS, TYPE II    Diverticulitis    Epidermal cyst    Fatty liver    GERD    GOUT    History of inguinal hernia    HOH (hard of hearing)    elft ear   HYPERLIPIDEMIA    HYPERTENSION    IBS    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, takedown and reversal September 2012   Ischemic cardiomyopathy 07/24/2018   Left renal mass 05/23/2011   Lumbar radicular pain 06/03/2011   Macular degeneration    right eye   Multinodular goiter    Myocardial infarction [facility])    silent MI in Pt's 30s   Pancreatic pseudocyst    Stable   Peripheral vascular disease (HCC)    Peritonsillar abscess    Pulmonary nodule    Right upper lobe   Radial neck fracture    Right upper lobe pulmonary nodule 07/24/2018   Thyroid disease    Thyroid nodule    Bilateral   Vertigo      Current Outpatient Medications  Medication Sig Dispense Refill   acetaminophen (TYLENOL) 500 MG tablet 1 tablet as needed     alendronate (FOSAMAX) 70 MG tablet Take 70 mg by mouth once a week. Take with a full glass of water on an empty stomach.     allopurinol (ZYLOPRIM) 300 MG tablet Take 1 tablet by mouth daily.     ALPRAZolam (XANAX) 0.5 MG tablet Take 1 tablet (0.5 mg total) by mouth daily as needed for sleep. 1/2 - 1 by  mouth once daily as needed (Patient taking differently: Take 0.25-0.5 mg by mouth at bedtime as needed for sleep.) 90 tablet 2   aspirin EC 81 MG tablet Take 81 mg by mouth every other day.      aspirin-acetaminophen-caffeine (EXCEDRIN EXTRA STRENGTH) 250-250-65 MG tablet 2 tablets as needed     atorvastatin (LIPITOR) 10 MG tablet Take 10 mg by mouth at bedtime.     carvedilol (COREG) 6.25 MG tablet Take 1 tablet (6.25 mg total) by mouth 2 (two) times daily. 180 tablet 3   Cholecalciferol (VITAMIN D-3) 25 MCG (1000 UT) CAPS Take 1,000 Units by mouth at bedtime.     dapagliflozin propanediol (FARXIGA) 5 MG TABS tablet Take 5 mg by mouth daily.     diphenhydrAMINE (BENADRYL) 25 MG tablet Take 25 mg by mouth at bedtime as needed for sleep.     famotidine (PEPCID) 40 MG tablet Take 40 mg by mouth as needed for heartburn or indigestion.     furosemide (LASIX) 40 MG tablet Take 40 mg by mouth daily.     gabapentin  (NEURONTIN) 100 MG capsule 1-3 capsules     guaiFENesin (MUCINEX) 600 MG 12 hr tablet Take 1,200 mg by mouth 2 (two) times daily as needed for cough or to loosen phlegm.     insulin glargine (LANTUS) 100 unit/mL SOPN Inject 44 Units into the skin at bedtime.     levothyroxine (SYNTHROID) 175 MCG tablet Take 224 mcg by mouth daily before breakfast.     Multiple Vitamins-Minerals (PRESERVISION AREDS 2 PO) Take 1 tablet by mouth 2 (two) times daily.     omega-3 acid ethyl esters (LOVAZA) 1 g capsule Take 2 g by mouth 2 (two) times daily.      pantoprazole (PROTONIX) 40 MG tablet Take 40 mg by mouth every morning.     sacubitril-valsartan (ENTRESTO) 49-51 MG Take 1 tablet by mouth twice daily 180 tablet 3   sildenafil (REVATIO) 20 MG tablet Take 100 mg by mouth daily as needed (ED).     triamcinolone cream (KENALOG) 0.1 % Apply 1 application topically 2 (two) times daily as needed (skin irritation).     TRULICITY 0.75 MG/0.5ML SOPN Inject 0.75 mg into the skin once a week. Monday     XARELTO 2.5 MG TABS tablet Take 1 tablet by mouth twice daily 180 tablet 0   No current facility-administered medications for this visit.    Physical Exam BP 124/79   Pulse 63   Resp 20   Ht 5\' 7"  (1.702 m)   Wt 200 lb (90.7 kg)   SpO2 96% Comment: RA  BMI 31.74 kg/m  72 year old man in no acute distress Alert and oriented x 3 with no focal deficits Well-developed and well-nourished  Diagnostic Tests: CT CHEST WITHOUT CONTRAST   TECHNIQUE: Multidetector CT imaging of the chest was performed following the standard protocol without IV contrast.   RADIATION DOSE REDUCTION: This exam was performed according to the departmental dose-optimization program which includes automated exposure control, adjustment of the mA and/or kV according to patient size and/or use of iterative reconstruction technique.   COMPARISON:  Chest CT 01/22/2023, 01/18/2022 and 01/09/2021. The oldest available chest CT is dated  08/06/2017.   FINDINGS: Cardiovascular: Atherosclerosis of the aorta, great vessels and coronary arteries status post median sternotomy and CABG. Probable calcifications of the aortic valve. The heart size is normal. There is no pericardial effusion.   Mediastinum/Nodes: There are no enlarged mediastinal, hilar or axillary lymph  nodes.Hilar assessment is limited by the lack of intravenous contrast, although the hilar contours appear unchanged. Postsurgical changes consistent with previous thyroidectomy. The esophagus appears unremarkable.   Lungs/Pleura: No pleural effusion or pneumothorax. Chronic pure ground-glass nodule posteriorly in the right upper lobe measures 2.2 x 2.3 cm, unchanged from recent prior studies. This demonstrates slow growth from 2019, although demonstrates no solid components. The multiple part solid nodules seen elsewhere in the right lung on the most recent prior study have resolved. No new or enlarging pulmonary nodules.   Upper abdomen: No acute or suspicious findings are seen in the visualized upper abdomen. There are calcified gallstones. There is a grossly stable partially image cyst in the mid right kidney for which no specific follow-up imaging is recommended. 2.4 cm cystic lesion in the pancreatic tail (image 137/2) is unchanged, previously evaluated by MRI 09/30/2021. Aortic and branch vessel atherosclerosis.   Musculoskeletal/Chest wall: There is no chest wall mass or suspicious osseous finding. Healed median sternotomy. Multilevel spondylosis. Bilateral gynecomastia. Stable subcutaneous lesion in the left anterior chest wall consistent with a sebaceous cyst.   IMPRESSION: 1. The multiple part solid nodules seen in the right lung on the most recent prior study have resolved consistent with a resolved inflammatory/infectious process. No new or enlarging pulmonary nodules. 2. Stable chronic pure ground-glass nodule posteriorly in the  right upper lobe. This demonstrates slow growth from 2019, although demonstrates no solid components. Continued annual CT follow-up suggested. 3. No evidence of metastatic disease. 4. Cholelithiasis. 5. 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 6.  Aortic Atherosclerosis (ICD10-I70.0).     Electronically Signed   By: Carey Bullocks M.D.   On: 04/30/2023 11:52 I personally reviewed the CT images.  The longstanding groundglass opacity is unchanged.  New nodules on last CT have resolved.  Gallstones.  Aortic atherosclerosis.  3.9 cm ascending aorta.  Impression: Nathan Medina is a 72 year old man with a past medical history significant for CAD, CABG x3, ischemic cardiomyopathy, hypertension, dyslipidemia, type 2 diabetes, renal cell carcinoma left kidney status post ablation, and a right upper lobe lung nodule.  Right upper lobe groundglass opacity-stable over past year.  Has slowly grown over time.  No solid component.  We had the same discussion that this could be a low-grade adenocarcinoma.  He still prefers radiographic observation to biopsy.  New lung nodules on last CT have resolved consistent with infectious or inflammatory nodules.  CAD-history of CABG x 3.  No anginal symptoms.  Plan: Return in 1 year with CT chest  Spent over 20 minutes in review of records, images, and in consultation with Mr. Burgos today.  Loreli Slot, MD [facility] [phone]

## 2023-05-27 DIAGNOSIS — E890 Postprocedural hypothyroidism: Secondary | ICD-10-CM | POA: Diagnosis not present

## 2023-05-30 ENCOUNTER — Inpatient Hospital Stay: Payer: Medicare PPO | Attending: Hematology

## 2023-05-30 DIAGNOSIS — E538 Deficiency of other specified B group vitamins: Secondary | ICD-10-CM | POA: Insufficient documentation

## 2023-05-30 DIAGNOSIS — R161 Splenomegaly, not elsewhere classified: Secondary | ICD-10-CM | POA: Insufficient documentation

## 2023-05-30 DIAGNOSIS — D696 Thrombocytopenia, unspecified: Secondary | ICD-10-CM | POA: Diagnosis not present

## 2023-05-30 LAB — CBC WITH DIFFERENTIAL (CANCER CENTER ONLY)
Abs Immature Granulocytes: 0.02 10*3/uL (ref 0.00–0.07)
Basophils Absolute: 0.1 10*3/uL (ref 0.0–0.1)
Basophils Relative: 1 %
Eosinophils Absolute: 0.7 10*3/uL — ABNORMAL HIGH (ref 0.0–0.5)
Eosinophils Relative: 12 %
HCT: 49.8 % (ref 39.0–52.0)
Hemoglobin: 17 g/dL (ref 13.0–17.0)
Immature Granulocytes: 0 %
Lymphocytes Relative: 33 %
Lymphs Abs: 2 10*3/uL (ref 0.7–4.0)
MCH: 31.9 pg (ref 26.0–34.0)
MCHC: 34.1 g/dL (ref 30.0–36.0)
MCV: 93.4 fL (ref 80.0–100.0)
Monocytes Absolute: 0.5 10*3/uL (ref 0.1–1.0)
Monocytes Relative: 8 %
Neutro Abs: 2.8 10*3/uL (ref 1.7–7.7)
Neutrophils Relative %: 46 %
Platelet Count: 104 10*3/uL — ABNORMAL LOW (ref 150–400)
RBC: 5.33 MIL/uL (ref 4.22–5.81)
RDW: 13.5 % (ref 11.5–15.5)
WBC Count: 6.2 10*3/uL (ref 4.0–10.5)
nRBC: 0 % (ref 0.0–0.2)

## 2023-05-30 LAB — VITAMIN B12: Vitamin B-12: 486 pg/mL (ref 180–914)

## 2023-05-31 ENCOUNTER — Encounter: Payer: Self-pay | Admitting: Hematology

## 2023-06-02 LAB — INTRINSIC FACTOR ANTIBODIES: Intrinsic Factor: 1.1 [AU]/ml (ref 0.0–1.1)

## 2023-06-03 LAB — METHYLMALONIC ACID, SERUM: Methylmalonic Acid, Quantitative: 423 nmol/L — ABNORMAL HIGH (ref 0–378)

## 2023-06-03 NOTE — Progress Notes (Signed)
 Thrombocytopenia slightly worse with platelet count at 104 with improved methylmalonic acid.  Phone visit with Dr. Mosetta Putt 06/06/2023.

## 2023-06-06 ENCOUNTER — Inpatient Hospital Stay: Payer: Medicare PPO | Admitting: Hematology

## 2023-06-06 ENCOUNTER — Encounter: Payer: Self-pay | Admitting: Hematology

## 2023-06-06 DIAGNOSIS — D696 Thrombocytopenia, unspecified: Secondary | ICD-10-CM

## 2023-06-06 NOTE — Progress Notes (Signed)
[facility]   Telephone:([phone] Fax:(336) [phone]   Clinic Follow up Note   Patient Care Team: Nathan Brunette, MD as PCP - General (Internal Medicine) 06/06/2023  I connected with Nathan Medina on 06/06/23 at  2:00 PM EST by telephone and verified that I am speaking with the correct person using two identifiers.   I discussed the limitations, risks, security and privacy concerns of performing an evaluation and management service by telephone and the availability of in person appointments. I also discussed with the patient that there may be a patient responsible charge related to this service. The patient expressed understanding and agreed to proceed.   Patient's location: Home Provider's location:  Office    CHIEF COMPLAINT: Follow-up thrombocytopenia   CURRENT THERAPY: Observation  Oncology history Thrombocytopenia (HCC) -He has had a mild intermittent thrombocytopenia since 2009, with platelet in the range of 100-150K  -Workup in September 2024 was negative except mildly low B12.  Immature platelet count was normal. 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  His mild thrombocytopenia is stable, will continue monitoring.  Assessment and Plan    Thrombocytopenia Chronic low platelet count, well-managed over several years. Current platelet count is 104, previously ranged from 110 to 150. No signs of bleeding. Possible causes include mild splenomegaly or autoimmune etiology. No immediate treatment required. Condition is not concerning and not at risk for bleeding. Follow-up with family doctor for blood counts at least once a year is sufficient, but patient prefers hematology follow-up as well. - Follow up with hematology in one year - Check blood counts with family doctor at least once a year - Call if platelet counts worsen  Vitamin B12 Deficiency Previously low B12 levels, now  within normal range with oral supplementation. Patient reports persistent fatigue despite normal B12 levels. Another marker for B12 still slightly low but not concerning. Continue oral B12 supplementation and recheck levels at next follow-up. - Continue oral B12 supplementation - Recheck B12 levels at next follow-up  Plan -Continue oral B12 - Schedule lab and follow-up appointment in one year.  -He will follow-up with PCP for CBC 1-2 times a year     Discussed the use of AI scribe software for clinical note transcription with the patient, who gave verbal consent to proceed.  History of Present Illness   Mr. Nathan Medina, a 73 year old with a history of thrombocytopenia and B12 deficiency, reports no noticeable improvement since starting oral B12 supplementation. He describes a chronic feeling of tiredness, which he attributes to his nature rather than a specific medical condition. He denies any bleeding episodes. He has a longstanding history of low platelet count, which has remained relatively stable over the years. A CT scan performed a year ago revealed a slightly enlarged spleen, a potential cause of his low platelet count. The patient expresses a preference for annual follow-ups.         REVIEW OF SYSTEMS:   Constitutional: Denies fevers, chills or abnormal weight loss Eyes: Denies blurriness of vision Ears, nose, mouth, throat, and face: Denies mucositis or sore throat Respiratory: Denies cough, dyspnea or wheezes Cardiovascular: Denies palpitation, chest discomfort or lower extremity swelling Gastrointestinal:  Denies nausea, heartburn or change in bowel habits Skin: Denies abnormal skin rashes Lymphatics: Denies new lymphadenopathy or easy bruising Neurological:Denies numbness, tingling or new weaknesses Behavioral/Psych: Mood is stable, no new changes  All other systems were reviewed with  the patient and are negative.  MEDICAL HISTORY:  Past Medical History:  Diagnosis Date    Abnormal liver function test 05/23/2011   ALLERGIC RHINITIS    ANXIETY    Aortic atherosclerosis (HCC)    Arthritis    Ascending aorta dilation (HCC) 07/24/2018   Bilateral renal cysts    Cervical spondylolysis    Moderate   CHF (congestive heart failure) (HCC) 07/24/2018   Chronic kidney disease    CKD stage 3 per office visit note 08/08/17 on chart    COLONIC POLYPS, HX OF    Coronary artery disease    DEPRESSION    DIABETES MELLITUS, TYPE II    Diverticulitis    Epidermal cyst    Fatty liver    GERD    GOUT    History of inguinal hernia    HOH (hard of hearing)    elft ear   HYPERLIPIDEMIA    HYPERTENSION    IBS    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, takedown and reversal September 2012   Ischemic cardiomyopathy 07/24/2018   Left renal mass 05/23/2011   Lumbar radicular pain 06/03/2011   Macular degeneration    right eye   Multinodular goiter    Myocardial infarction (HCC)    silent MI in Pt's 30s   Pancreatic pseudocyst    Stable   Peripheral vascular disease (HCC)    Peritonsillar abscess    Pulmonary nodule    Right upper lobe   Radial neck fracture    Right upper lobe pulmonary nodule 07/24/2018   Thyroid disease    Thyroid nodule    Bilateral   Vertigo     SURGICAL HISTORY: Past Surgical History:  Procedure Laterality Date   APPENDECTOMY  02/12/11   COLON SURGERY  08/11/10   sigmoid colectomy   COLON SURGERY  02/12/11   colostomy takedown   COLONOSCOPY     CORONARY ARTERY BYPASS GRAFT N/A 04/01/2018   Procedure: CORONARY ARTERY BYPASS GRAFTING (CABG) x Three, using left internal mammary artery and right leg greater saphenous vein harvested endoscopically;  Surgeon: Delight Ovens, MD;  Location: [facility] OR;  Service: Open Heart Surgery;  Laterality: N/A;   CYST REMOVAL TRUNK Right 02/20/2018   Procedure: epidermal cyst excision right lower back;  Surgeon: Darnell Level, MD;  Location: [facility];  Service: General;   Laterality: Right;   HERNIA REPAIR     INSERTION OF MESH N/A 08/20/2012   Procedure: INSERTION OF MESH;  Surgeon: Robyne Askew, MD;  Location: [facility];  Service: General;  Laterality: N/A;   IR RADIOLOGIST EVAL & MGMT  03/23/2020   IR RADIOLOGIST EVAL & MGMT  07/26/2020   IR RADIOLOGIST EVAL & MGMT  11/01/2020   IR RADIOLOGIST EVAL & MGMT  05/10/2021   KNEE ARTHROSCOPY Right 10/18/2019   LEFT HEART CATH AND CORONARY ANGIOGRAPHY N/A 01/14/2018   Procedure: LEFT HEART CATH AND CORONARY ANGIOGRAPHY;  Surgeon: Elder Negus, MD;  Location: [facility];  Service: Cardiovascular;  Laterality: N/A;   LYSIS OF ADHESION  08/20/2012   Procedure: LYSIS OF ADHESION;  Surgeon: Robyne Askew, MD;  Location: [facility];  Service: General;;   QUADRICEPS TENDON REPAIR Right 10/20/2019   Procedure: REPAIR RIGHT QUADRICEP TENDON;  Surgeon: Durene Romans, MD;  Location: [facility] OR;  Service: Orthopedics;  Laterality: Right;   RADIOLOGY WITH ANESTHESIA N/A 06/22/2020   Procedure: CT WITH ANESTHESIA  CRYOABLATION;  Surgeon:  Berdine Dance, MD;  Location: [facility];  Service: Anesthesiology;  Laterality: N/A;   RADIOLOGY WITH ANESTHESIA N/A 07/06/2020   Procedure: CT WITH ANESTHESIA;  Surgeon: Berdine Dance, MD;  Location: [facility];  Service: Anesthesiology;  Laterality: N/A;   SEPTOPLASTY  age 30   TEE WITHOUT CARDIOVERSION N/A 04/01/2018   Procedure: TRANSESOPHAGEAL ECHOCARDIOGRAM (TEE);  Surgeon: Delight Ovens, MD;  Location: [facility] OR;  Service: Open Heart Surgery;  Laterality: N/A;   THYROIDECTOMY N/A 10/17/2017   Procedure: TOTAL THYROIDECTOMY;  Surgeon: Darnell Level, MD;  Location: [facility];  Service: General;  Laterality: N/A;   TONSILLECTOMY Right 08/01/2016   Procedure: INCISION AND DRAINAGE RIGHT PERI-TONSILLAR ABSCESS;  Surgeon: Flo Shanks, MD;  Location: [facility];  Service: ENT;  Laterality: Right;   ULTRASOUND GUIDANCE FOR VASCULAR ACCESS  01/14/2018   Procedure: Ultrasound Guidance For Vascular Access;   Surgeon: Elder Negus, MD;  Location: [facility];  Service: Cardiovascular;;   VENTRAL HERNIA REPAIR  08/20/2012   Procedure: HERNIA REPAIR VENTRAL ADULT;  Surgeon: Robyne Askew, MD;  Location: [facility];  Service: General;;    I have reviewed the social history and family history with the patient and they are unchanged from previous note.  ALLERGIES:  is allergic to ciprofloxacin, escitalopram oxalate, gadolinium derivatives, quinapril hcl, and sulfa antibiotics.  MEDICATIONS:  Current Outpatient Medications  Medication Sig Dispense Refill   acetaminophen (TYLENOL) 500 MG tablet 1 tablet as needed     alendronate (FOSAMAX) 70 MG tablet Take 70 mg by mouth once a week. Take with a full glass of water on an empty stomach.     allopurinol (ZYLOPRIM) 300 MG tablet Take 1 tablet by mouth daily.     ALPRAZolam (XANAX) 0.5 MG tablet Take 1 tablet (0.5 mg total) by mouth daily as needed for sleep. 1/2 - 1 by mouth once daily as needed (Patient taking differently: Take 0.25-0.5 mg by mouth at bedtime as needed for sleep.) 90 tablet 2   aspirin EC 81 MG tablet Take 81 mg by mouth every other day.      aspirin-acetaminophen-caffeine (EXCEDRIN EXTRA STRENGTH) 250-250-65 MG tablet 2 tablets as needed     atorvastatin (LIPITOR) 10 MG tablet Take 10 mg by mouth at bedtime.     carvedilol (COREG) 6.25 MG tablet Take 1 tablet (6.25 mg total) by mouth 2 (two) times daily. 180 tablet 3   Cholecalciferol (VITAMIN D-3) 25 MCG (1000 UT) CAPS Take 1,000 Units by mouth at bedtime.     dapagliflozin propanediol (FARXIGA) 5 MG TABS tablet Take 5 mg by mouth daily.     diphenhydrAMINE (BENADRYL) 25 MG tablet Take 25 mg by mouth at bedtime as needed for sleep.     famotidine (PEPCID) 40 MG tablet Take 40 mg by mouth as needed for heartburn or indigestion.     furosemide (LASIX) 40 MG tablet Take 40 mg by mouth daily.     gabapentin (NEURONTIN) 100 MG capsule 1-3 capsules     guaiFENesin (MUCINEX) 600 MG  12 hr tablet Take 1,200 mg by mouth 2 (two) times daily as needed for cough or to loosen phlegm.     insulin glargine (LANTUS) 100 unit/mL SOPN Inject 44 Units into the skin at bedtime.     levothyroxine (SYNTHROID) 175 MCG tablet Take 224 mcg by mouth daily before breakfast.     Multiple Vitamins-Minerals (PRESERVISION AREDS 2 PO) Take 1 tablet by mouth 2 (two) times daily.  omega-3 acid ethyl esters (LOVAZA) 1 g capsule Take 2 g by mouth 2 (two) times daily.      pantoprazole (PROTONIX) 40 MG tablet Take 40 mg by mouth every morning.     sacubitril-valsartan (ENTRESTO) 49-51 MG Take 1 tablet by mouth twice daily 180 tablet 3   sildenafil (REVATIO) 20 MG tablet Take 100 mg by mouth daily as needed (ED).     triamcinolone cream (KENALOG) 0.1 % Apply 1 application topically 2 (two) times daily as needed (skin irritation).     TRULICITY 0.75 MG/0.5ML SOPN Inject 0.75 mg into the skin once a week. Monday     XARELTO 2.5 MG TABS tablet Take 1 tablet by mouth twice daily 180 tablet 0   No current facility-administered medications for this visit.    PHYSICAL EXAMINATION: Not performed   LABORATORY DATA:  I have reviewed the data as listed    Latest Ref Rng & Units 05/30/2023    2:00 PM 01/30/2023    3:50 PM 07/07/2020    8:10 AM  CBC  WBC 4.0 - 10.5 K/uL 6.2  6.3  11.7   Hemoglobin 13.0 - 17.0 g/dL 60.4  54.0  98.1   Hematocrit 39.0 - 52.0 % 49.8  49.8  44.6   Platelets 150 - 400 K/uL 104  119  134         Latest Ref Rng & Units 04/30/2022    9:01 AM 07/07/2020    8:10 AM 07/06/2020   10:15 AM  CMP  Glucose 70 - 99 mg/dL  191  478   BUN 8 - 23 mg/dL  39  29   Creatinine 2.95 - 1.24 mg/dL 6.21  3.08  6.57   Sodium 135 - 145 mmol/L  138  143   Potassium 3.5 - 5.1 mmol/L  4.4  3.8   Chloride 98 - 111 mmol/L  104  106   CO2 22 - 32 mmol/L  25  26   Calcium 8.9 - 10.3 mg/dL  8.6  9.1       RADIOGRAPHIC STUDIES: I have personally reviewed the radiological images as listed and  agreed with the findings in the report. No results found.     I discussed the assessment and treatment plan with the patient. The patient was provided an opportunity to ask questions and all were answered. The patient agreed with the plan and demonstrated an understanding of the instructions.   The patient was advised to call back or seek an in-person evaluation if the symptoms worsen or if the condition fails to improve as anticipated.  I provided 11 minutes of non face-to-face telephone visit time during this encounter, and > 50% was spent counseling as documented under my assessment & plan.     Malachy Mood, MD 06/06/23

## 2023-06-06 NOTE — Assessment & Plan Note (Addendum)
-  He has had a mild intermittent thrombocytopenia since 2009, with platelet in the range of 100-150K  -Workup in September 2024 was negative except mildly low B12.  Immature platelet count was normal. 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  His mild thrombocytopenia is stable, will continue monitoring.

## 2023-06-16 ENCOUNTER — Other Ambulatory Visit: Payer: Self-pay | Admitting: Cardiology

## 2023-06-17 NOTE — Telephone Encounter (Signed)
Prescription refill request for Xarelto received.  Indication:cad Last office visit:10/24 Weight:90.7  kg Age:73 Scr:1.51  8/24 CrCl:55.89  ml/min  Prescription refilled

## 2023-07-12 DIAGNOSIS — Z85850 Personal history of malignant neoplasm of thyroid: Secondary | ICD-10-CM | POA: Diagnosis not present

## 2023-07-12 DIAGNOSIS — Z794 Long term (current) use of insulin: Secondary | ICD-10-CM | POA: Diagnosis not present

## 2023-07-12 DIAGNOSIS — E890 Postprocedural hypothyroidism: Secondary | ICD-10-CM | POA: Diagnosis not present

## 2023-07-12 DIAGNOSIS — M810 Age-related osteoporosis without current pathological fracture: Secondary | ICD-10-CM | POA: Diagnosis not present

## 2023-07-12 DIAGNOSIS — E1122 Type 2 diabetes mellitus with diabetic chronic kidney disease: Secondary | ICD-10-CM | POA: Diagnosis not present

## 2023-07-25 DIAGNOSIS — E1122 Type 2 diabetes mellitus with diabetic chronic kidney disease: Secondary | ICD-10-CM | POA: Diagnosis not present

## 2023-08-09 DIAGNOSIS — B354 Tinea corporis: Secondary | ICD-10-CM | POA: Diagnosis not present

## 2023-08-26 DIAGNOSIS — R972 Elevated prostate specific antigen [PSA]: Secondary | ICD-10-CM | POA: Diagnosis not present

## 2023-09-02 DIAGNOSIS — N401 Enlarged prostate with lower urinary tract symptoms: Secondary | ICD-10-CM | POA: Diagnosis not present

## 2023-09-02 DIAGNOSIS — R350 Frequency of micturition: Secondary | ICD-10-CM | POA: Diagnosis not present

## 2023-09-02 DIAGNOSIS — N5201 Erectile dysfunction due to arterial insufficiency: Secondary | ICD-10-CM | POA: Diagnosis not present

## 2023-09-02 DIAGNOSIS — R972 Elevated prostate specific antigen [PSA]: Secondary | ICD-10-CM | POA: Diagnosis not present

## 2023-11-07 DIAGNOSIS — H6123 Impacted cerumen, bilateral: Secondary | ICD-10-CM | POA: Diagnosis not present

## 2023-11-07 DIAGNOSIS — R051 Acute cough: Secondary | ICD-10-CM | POA: Diagnosis not present

## 2023-11-07 DIAGNOSIS — J329 Chronic sinusitis, unspecified: Secondary | ICD-10-CM | POA: Diagnosis not present

## 2023-11-07 DIAGNOSIS — H66003 Acute suppurative otitis media without spontaneous rupture of ear drum, bilateral: Secondary | ICD-10-CM | POA: Diagnosis not present

## 2023-11-10 DIAGNOSIS — H1032 Unspecified acute conjunctivitis, left eye: Secondary | ICD-10-CM | POA: Diagnosis not present

## 2023-11-10 DIAGNOSIS — H6693 Otitis media, unspecified, bilateral: Secondary | ICD-10-CM | POA: Diagnosis not present

## 2023-11-10 DIAGNOSIS — J019 Acute sinusitis, unspecified: Secondary | ICD-10-CM | POA: Diagnosis not present

## 2023-11-20 DIAGNOSIS — H6993 Unspecified Eustachian tube disorder, bilateral: Secondary | ICD-10-CM | POA: Diagnosis not present

## 2023-12-02 ENCOUNTER — Telehealth: Payer: Self-pay | Admitting: Hematology

## 2023-12-02 NOTE — Telephone Encounter (Signed)
 Rescheule appointment per provider pal.  Called left VM with changes made to the upcoming appointment.

## 2023-12-05 ENCOUNTER — Other Ambulatory Visit (HOSPITAL_COMMUNITY): Payer: Self-pay | Admitting: Gastroenterology

## 2023-12-05 DIAGNOSIS — K862 Cyst of pancreas: Secondary | ICD-10-CM

## 2023-12-05 DIAGNOSIS — Z860100 Personal history of colon polyps, unspecified: Secondary | ICD-10-CM | POA: Diagnosis not present

## 2023-12-05 DIAGNOSIS — K200 Eosinophilic esophagitis: Secondary | ICD-10-CM | POA: Diagnosis not present

## 2023-12-05 DIAGNOSIS — D696 Thrombocytopenia, unspecified: Secondary | ICD-10-CM | POA: Diagnosis not present

## 2023-12-05 DIAGNOSIS — Z8679 Personal history of other diseases of the circulatory system: Secondary | ICD-10-CM | POA: Diagnosis not present

## 2023-12-05 DIAGNOSIS — Z9049 Acquired absence of other specified parts of digestive tract: Secondary | ICD-10-CM | POA: Diagnosis not present

## 2023-12-05 DIAGNOSIS — K219 Gastro-esophageal reflux disease without esophagitis: Secondary | ICD-10-CM | POA: Diagnosis not present

## 2023-12-06 ENCOUNTER — Other Ambulatory Visit: Payer: Self-pay | Admitting: Cardiology

## 2023-12-06 NOTE — Telephone Encounter (Signed)
 Prescription refill request for Xarelto  received.  Indication:cad Last office visit:10/24 Weight:90.7  kg Age:73 Scr:1.52  7/24 CrCl:55.53  ml/min  Prescription refilled

## 2023-12-16 ENCOUNTER — Ambulatory Visit (HOSPITAL_COMMUNITY)
Admission: RE | Admit: 2023-12-16 | Discharge: 2023-12-16 | Disposition: A | Discharge status: Home / Self Care | Source: Ambulatory Visit | Attending: Gastroenterology | Admitting: Gastroenterology

## 2023-12-16 ENCOUNTER — Other Ambulatory Visit (HOSPITAL_COMMUNITY): Payer: Self-pay | Admitting: Gastroenterology

## 2023-12-16 DIAGNOSIS — K862 Cyst of pancreas: Secondary | ICD-10-CM

## 2023-12-16 DIAGNOSIS — R161 Splenomegaly, not elsewhere classified: Secondary | ICD-10-CM | POA: Diagnosis not present

## 2023-12-16 DIAGNOSIS — N280 Ischemia and infarction of kidney: Secondary | ICD-10-CM | POA: Diagnosis not present

## 2023-12-16 DIAGNOSIS — K8020 Calculus of gallbladder without cholecystitis without obstruction: Secondary | ICD-10-CM | POA: Diagnosis not present

## 2023-12-16 DIAGNOSIS — K8689 Other specified diseases of pancreas: Secondary | ICD-10-CM | POA: Diagnosis not present

## 2023-12-16 MED ORDER — GADOBUTROL 1 MMOL/ML IV SOLN
9.0000 mL | Freq: Once | INTRAVENOUS | Status: AC | PRN
  Administered 2023-12-16: 9 mL via INTRAVENOUS

## 2023-12-17 DIAGNOSIS — N2889 Other specified disorders of kidney and ureter: Secondary | ICD-10-CM | POA: Diagnosis not present

## 2023-12-17 DIAGNOSIS — E1122 Type 2 diabetes mellitus with diabetic chronic kidney disease: Secondary | ICD-10-CM | POA: Diagnosis not present

## 2023-12-17 DIAGNOSIS — N2581 Secondary hyperparathyroidism of renal origin: Secondary | ICD-10-CM | POA: Diagnosis not present

## 2023-12-17 DIAGNOSIS — I2510 Atherosclerotic heart disease of native coronary artery without angina pectoris: Secondary | ICD-10-CM | POA: Diagnosis not present

## 2023-12-17 DIAGNOSIS — N183 Chronic kidney disease, stage 3 unspecified: Secondary | ICD-10-CM | POA: Diagnosis not present

## 2023-12-17 DIAGNOSIS — D631 Anemia in chronic kidney disease: Secondary | ICD-10-CM | POA: Diagnosis not present

## 2023-12-17 DIAGNOSIS — R809 Proteinuria, unspecified: Secondary | ICD-10-CM | POA: Diagnosis not present

## 2023-12-17 DIAGNOSIS — M109 Gout, unspecified: Secondary | ICD-10-CM | POA: Diagnosis not present

## 2023-12-17 DIAGNOSIS — I129 Hypertensive chronic kidney disease with stage 1 through stage 4 chronic kidney disease, or unspecified chronic kidney disease: Secondary | ICD-10-CM | POA: Diagnosis not present

## 2023-12-26 DIAGNOSIS — I10 Essential (primary) hypertension: Secondary | ICD-10-CM | POA: Diagnosis not present

## 2023-12-26 DIAGNOSIS — E039 Hypothyroidism, unspecified: Secondary | ICD-10-CM | POA: Diagnosis not present

## 2023-12-26 DIAGNOSIS — E1122 Type 2 diabetes mellitus with diabetic chronic kidney disease: Secondary | ICD-10-CM | POA: Diagnosis not present

## 2023-12-26 DIAGNOSIS — E785 Hyperlipidemia, unspecified: Secondary | ICD-10-CM | POA: Diagnosis not present

## 2023-12-26 DIAGNOSIS — Z125 Encounter for screening for malignant neoplasm of prostate: Secondary | ICD-10-CM | POA: Diagnosis not present

## 2023-12-30 DIAGNOSIS — I5020 Unspecified systolic (congestive) heart failure: Secondary | ICD-10-CM | POA: Diagnosis not present

## 2023-12-30 DIAGNOSIS — I499 Cardiac arrhythmia, unspecified: Secondary | ICD-10-CM | POA: Diagnosis not present

## 2023-12-30 DIAGNOSIS — N2581 Secondary hyperparathyroidism of renal origin: Secondary | ICD-10-CM | POA: Diagnosis not present

## 2023-12-30 DIAGNOSIS — I480 Paroxysmal atrial fibrillation: Secondary | ICD-10-CM | POA: Diagnosis not present

## 2023-12-30 DIAGNOSIS — N1831 Chronic kidney disease, stage 3a: Secondary | ICD-10-CM | POA: Diagnosis not present

## 2023-12-30 DIAGNOSIS — E1122 Type 2 diabetes mellitus with diabetic chronic kidney disease: Secondary | ICD-10-CM | POA: Diagnosis not present

## 2023-12-30 DIAGNOSIS — Z0000 Encounter for general adult medical examination without abnormal findings: Secondary | ICD-10-CM | POA: Diagnosis not present

## 2023-12-30 DIAGNOSIS — D696 Thrombocytopenia, unspecified: Secondary | ICD-10-CM | POA: Diagnosis not present

## 2023-12-30 DIAGNOSIS — E032 Hypothyroidism due to medicaments and other exogenous substances: Secondary | ICD-10-CM | POA: Diagnosis not present

## 2024-01-02 NOTE — Progress Notes (Signed)
 Electrophysiology Office Note:    Date:  01/03/2024   ID:  [name], DOB 1951/01/31, MRN [id number]  PCP:  Nathan Nottingham, MD   [facility] Providers Cardiologist:  None Electrophysiologist:  Nathan FORBES Furbish, MD     Referring MD: Nathan Nottingham, MD   History of Present Illness:    Nathan Medina is a 73 y.o. male with a medical history significant for atrial fibrillation, ischemic cardiomyopathy with recovered EF, type 2 diabetes, CAD s/p CABGx3 referred for management of atrial fibrillation.      Discussed the use of AI scribe software for clinical note transcription with the patient, who gave verbal consent to proceed.  History of Present Illness TREVAR Medina is a 73 year old male with atrial fibrillation who presents with symptoms suggestive of atrial fibrillation. He was referred by Dr. Nottingham Applethwaite for evaluation of atrial fibrillation.  He has ischemic cardiomyopathy with a recovered ejection fraction and coronary artery disease, having undergone CABG times three. Atrial fibrillation was noted during a visit on December 30, 2023. He has been on a low dose of Xarelto  for several years. Approximately ten days ago, he began experiencing new symptoms, including leg swelling and shortness of breath while mowing the lawn. He denies palpitations, fatigue, or other symptoms during this period.         Today, he feels well and has no complaints  EKGs/Labs/Other Studies Reviewed Today:     Echocardiogram:  TTE September 2023 LVEF 50 to 55%.  Mild concentric LVH.  Grade 1 diastolic dysfunction.  Apical hypokinesis       EKG:   EKG Interpretation Date/Time:  Friday January 03 2024 11:54:27 EDT Ventricular Rate:  62 PR Interval:  188 QRS Duration:  110 QT Interval:  402 QTC Calculation: 408 R Axis:   -32  Text Interpretation: Sinus rhythm with occasional Premature ventricular complexes , premature atrial complaxes Left axis deviation Low voltage QRS  Cannot rule out Anteroseptal infarct (cited on or before 22-Jun-2020) When compared with ECG of 20-Feb-2023 10:07, Premature ventricular complexes are now Present Questionable change in initial forces of Lateral leads T wave inversion less evident in Anterior leads Confirmed by Medina Nathan [phone]) on 01/03/2024 12:16:23 PM     Physical Exam:    VS:  BP 118/80 (BP Location: Right Arm, Patient Position: Sitting, Cuff Size: Large)   Pulse 62   Ht 5' 7 (1.702 m)   Wt 201 lb (91.2 kg)   SpO2 98%   BMI 31.48 kg/m     Wt Readings from Last 3 Encounters:  01/03/24 201 lb (91.2 kg)  05/07/23 200 lb (90.7 kg)  02/20/23 193 lb 12.8 oz (87.9 kg)     GEN: Well nourished, well developed in no acute distress CARDIAC: RRR, no murmurs, rubs, gallops RESPIRATORY:  Normal work of breathing MUSCULOSKELETAL: no edema    ASSESSMENT & PLAN:     Atrial fibrillation Minimally symptomatic I personally reviewed the EKG from August 11 showing atrial fibrillation with rapid ventricular rates.  It has been submitted to be scanned. We discussed management options.  Using a shared decision making approach we have decided to proceed with atrial fibrillation ablation.  We discussed the indication, rationale, logistics, anticipated benefits, and potential risks of the ablation procedure including but not limited to -- bleed at the groin access site, chest pain, damage to nearby organs such as the diaphragm, lungs, or esophagus, need for a drainage tube, or prolonged hospitalization. I explained  that the risk for stroke, heart attack, need for open chest surgery, or even death is very low but not zero. he  expressed understanding and wishes to proceed.   Secondary hypercoagulable state CHA2DS2-VASc score is 5  Ischemic cardiomyopathy, recovered EF With apical akinesis On Farxiga 5 mg daily, carvedilol 6.25 mg daily, Entresto 49-51 twice daily  CAD Status post CABG No anginal symptoms Continue  atorvastatin 10, aspirin 81    Signed, Nathan FORBES Furbish, MD  01/03/2024 5:23 PM    [facility]

## 2024-01-03 ENCOUNTER — Ambulatory Visit: Attending: Cardiovascular Disease | Admitting: Cardiovascular Disease

## 2024-01-03 ENCOUNTER — Encounter: Payer: Self-pay | Admitting: Cardiovascular Disease

## 2024-01-03 ENCOUNTER — Other Ambulatory Visit: Payer: Self-pay

## 2024-01-03 VITALS — BP 118/80 | HR 62 | Temp 98.2°F | Resp 16 | Ht 67.0 in | Wt 201.0 lb

## 2024-01-03 DIAGNOSIS — I5022 Chronic systolic (congestive) heart failure: Secondary | ICD-10-CM

## 2024-01-03 NOTE — Patient Instructions (Addendum)
 Medication Instructions:  Your physician recommends that you continue on your current medications as directed. Please refer to the Current Medication list given to you today.  *If you need a refill on your cardiac medications before your next appointment, please call your pharmacy*  Testing/Procedures: Ablation Your physician has recommended that you have an ablation. Catheter ablation is a medical procedure used to treat some cardiac arrhythmias (irregular heartbeats). During catheter ablation, a long, thin, flexible tube is put into a blood vessel in your groin (upper thigh), or neck. This tube is called an ablation catheter. It is then guided to your heart through the blood vessel. Radio frequency waves destroy small areas of heart tissue where abnormal heartbeats may cause an arrhythmia to start.   You are scheduled for Atrial Fibrillation Ablation on Wednesday, October 15th with Dr. Eulas Furbish.Please arrive at the Main Entrance A at [facility]: [address] [place], [place] [address] at 9:00am    What To Expect:  Labs: you will need to have lab work drawn the week of September 29th. Please go to any [facility] location to have these drawn - no appointment is needed. You will receive procedure instructions either through MyChart or in the mail 4-6 week prior to your procedure.  After your procedure we recommend no driving for 3 days, no lifting over 10 lbs for 5 days, and no work or strenuous activity for 7 days.  Please contact our office at [phone] if you have any questions.    Follow-Up: We will contact you to schedule your post-procedure appointments.

## 2024-01-06 ENCOUNTER — Encounter: Payer: Self-pay | Admitting: Cardiology

## 2024-01-06 ENCOUNTER — Ambulatory Visit: Attending: Cardiology | Admitting: Cardiology

## 2024-01-06 VITALS — BP 114/60 | HR 57 | Ht 66.0 in | Wt 202.6 lb

## 2024-01-06 DIAGNOSIS — I480 Paroxysmal atrial fibrillation: Secondary | ICD-10-CM | POA: Diagnosis not present

## 2024-01-06 DIAGNOSIS — I5020 Unspecified systolic (congestive) heart failure: Secondary | ICD-10-CM | POA: Diagnosis not present

## 2024-01-06 DIAGNOSIS — R600 Localized edema: Secondary | ICD-10-CM | POA: Diagnosis not present

## 2024-01-06 DIAGNOSIS — I25810 Atherosclerosis of coronary artery bypass graft(s) without angina pectoris: Secondary | ICD-10-CM

## 2024-01-06 NOTE — Progress Notes (Signed)
 Cardiology Office Note:  .   Date:  01/06/2024  ID:  Nathan Medina, DOB [birth year], MRN [id number] PCP: Clarice Nottingham, MD  [facility] Providers Cardiologist:  Newman Lawrence, MD PCP: Clarice Nottingham, MD  Chief Complaint:  Chief Complaint  Patient presents with   Paroxysmal A-fib      History of Present Illness: .   Nathan Medina is a 73 y.o. male with hypertension, CKD stage III, and 2 diabetes mellitus, ischemic cardiomyopathy with recovered EF, coronary artery disease s/p CABGX3 (LIMA-LAD, SVG-dRCA, SVG-ramus) in 2019, paroxysmal A-fib,  stable renal and lung nodules, s/p thyroidectomy, stable thrombocytopenia  Patient was recently diagnosed with paroxysmal A-fib after an EKG performed at PCP.  He was seen by Dr. Nancey and was recommended ablation, which is scheduled soon.  He is tolerating Xarelto  15 mg daily without any bleeding issues.  He has recently noticed bilateral leg swelling, right greater than sign left.  Vitals:   01/06/24 0829  BP: 114/60  Pulse: (!) 57  SpO2: 93%     ROS:  Review of Systems  Cardiovascular:  Negative for chest pain, dyspnea on exertion, leg swelling, palpitations and syncope.     Studies Reviewed: SABRA    EKG 01/06/2024: Sinus rhythm with frequent Premature ventricular complexes Left axis deviation Low voltage QRS Inferior infarct , age undetermined Cannot rule out Anteroseptal infarct (cited on or before 22-Jun-2020) When compared with ECG of 03-Jan-2024 11:54, No significant change was found  CT chest 04/2023: 1. The multiple part solid nodules seen in the right lung on the most recent prior study have resolved consistent with a resolved inflammatory/infectious process. No new or enlarging pulmonary nodules. 2. Stable chronic pure ground-glass nodule posteriorly in the right upper lobe. This demonstrates slow growth from 2019, although demonstrates no solid components. Continued annual CT follow-up suggested. 3.  No evidence of metastatic disease. 4. Cholelithiasis. 5. 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 6.  Aortic Atherosclerosis (ICD10-I70.0).  Echocardiogram 01/2022: Normal LV systolic function with visual EF 50-55%. Left ventricle cavity is normal in size. Mild concentric hypertrophy of the left ventricle. Abnormal septal wall motion due to post-operative coronary artery bypass graft. Doppler evidence of grade I (impaired) diastolic dysfunction, normal LAP. Left ventricle regional wall motion findings: Apical anterior, Apical inferior, Apical septal and Apical cap hypokinesis. Calculated EF 53%. Structurally normal trileaflet aortic valve.  Trace aortic regurgitation. Structurally normal tricuspid valve with trace regurgitation. No evidence of pulmonary hypertension. EF improved compared to 01/2021 however there appears to be new apical hypokinesis, would repeat with contrast agent.  Labs 1-12/2023: Chol 94, TG 112, HDL 33, LDL 40 HbA1C 6.8% Hb 17   Jul-Sept 2024: Glucose NA, BUN/Cr 32/1.5. EGFR 49. K 4.0 Hb 16.6 HbA1C 6.4% Chol 75, TG 73, HDL 34, LDL 25  03/2022: TSH 2.7 normal  EKG 02/20/2023: Normal sinus rhythm Left axis deviation Low voltage QRS Inferior infarct (cited on or before 22-Jun-2020) Possible Anterolateral infarct (cited on or before 22-Jun-2020) When compared with ECG of 22-Jun-2020 09:44, Inverted T waves have replaced nonspecific T wave abnormality in Anterolateral leads    Echocardiogram 02/06/2022:  Normal LV systolic function with visual EF 50-55%. Left ventricle cavity  is normal in size. Mild concentric hypertrophy of the left ventricle.  Abnormal septal wall motion due to post-operative coronary artery bypass  graft. Doppler evidence of grade I (impaired) diastolic dysfunction,  normal LAP. Left ventricle regional wall motion findings: Apical anterior,  Apical inferior, Apical  septal and Apical cap hypokinesis. Calculated  EF  53%.  Structurally normal trileaflet aortic valve.  Trace aortic regurgitation.  Structurally normal tricuspid valve with trace regurgitation. No evidence  of pulmonary hypertension.  EF improved compared to 01/2021 however there appears to be new apical  hypokinesis, would repeat with contrast agent.    Physical Exam:   Physical Exam Vitals and nursing note reviewed.  Constitutional:      General: He is not in acute distress. Neck:     Vascular: No JVD.  Cardiovascular:     Rate and Rhythm: Normal rate and regular rhythm.     Heart sounds: Normal heart sounds. No murmur heard. Pulmonary:     Effort: Pulmonary effort is normal.     Breath sounds: Normal breath sounds. No wheezing or rales.  Musculoskeletal:     Right lower leg: Edema (Trace, likely due to venous insufficiency) present.     Left lower leg: Edema (Trace, likely due to venous insufficiency) present.      VISIT DIAGNOSES:   ICD-10-CM   1. HFrEF (heart failure with reduced ejection fraction) (HCC)  I50.20 EKG 12-Lead    Pro b natriuretic peptide (BNP)    2. Coronary artery disease involving coronary bypass graft of native heart without angina pectoris  I25.810 EKG 12-Lead    ECHOCARDIOGRAM COMPLETE    3. Leg edema  R60.0 Pro b natriuretic peptide (BNP)    ECHOCARDIOGRAM COMPLETE    4. Paroxysmal A-fib (HCC)  I48.0        ASSESSMENT AND PLAN: .    Nathan Medina is a 73 y.o. male with hypertension, CKD stage III, and 2 diabetes mellitus, ischemic cardiomyopathy with recovered EF, coronary artery disease s/p CABGX3 (LIMA-LAD, SVG-dRCA, SVG-ramus) in 2019, paroxysmal A-fib,  stable renal and lung nodules, s/p thyroidectomy, stable thrombocytopenia  Paroxysmal A-fib: Currently in sinus rhythm. Continue Coreg  6.25 mg twice daily. Continue Xarelto  50 mg daily (creatinine clearance <50). Plan for ablation with Dr. Nancey.  Ischemic cardiomyopathy: Recovered EF 50-55% (12/2019).  Reviewed and compared  with previous echocardiograms. Apical akinesis is stable and unchanged.  Currently on Entresto  to 49-51 mg bid, coreg  6.25 mg bid, Farxiga 10 mg daily. Continue lasix  40 mg daily. Given recent increase in leg swelling, will check proBNP and echocardiogram.  If both within acceptable limits, I suspect his leg swelling could be due to venous insufficiency.  Recommend using compression stockings every day, leg elevation at night.   CAD: S/p CABG. No angina symptoms. Discontinued aspirin  given ongoing use of Xarelto .   Hyperlipidemia: Well controlled. TG have also improved. Continue lipitor 10 mg daily.    Thrombocytopenia: Mild, stable. No bleeding. Continue f/u w/PCP.    Management of DM, CKD III, renal and lung nodules as per PCP and other specialists.    F/u in 6 months  Signed, Newman JINNY Lawrence, MD

## 2024-01-06 NOTE — Patient Instructions (Signed)
 Medication Instructions:  STOP Aspirin  81 mg   *If you need a refill on your cardiac medications before your next appointment, please call your pharmacy*  Lab Work: Probnp  If you have labs (blood work) drawn today and your tests are completely normal, you will receive your results only by: MyChart Message (if you have MyChart) OR A paper copy in the mail If you have any lab test that is abnormal or we need to change your treatment, we will call you to review the results.  Testing/Procedures: Echo  Your physician has requested that you have an echocardiogram. Echocardiography is a painless test that uses sound waves to create images of your heart. It provides your doctor with information about the size and shape of your heart and how well your heart's chambers and valves are working. This procedure takes approximately one hour. There are no restrictions for this procedure. Please do NOT wear cologne, perfume, aftershave, or lotions (deodorant is allowed). Please arrive 15 minutes prior to your appointment time.  Please note: We ask at that you not bring children with you during ultrasound (echo/ vascular) testing. Due to room size and safety concerns, children are not allowed in the ultrasound rooms during exams. Our front office staff cannot provide observation of children in our lobby area while testing is being conducted. An adult accompanying a patient to their appointment will only be allowed in the ultrasound room at the discretion of the ultrasound technician under special circumstances. We apologize for any inconvenience.   Follow-Up: At [facility], you and your health needs are our priority.  As part of our continuing mission to provide you with exceptional heart care, our providers are all part of one team.  This team includes your primary Cardiologist (physician) and Advanced Practice Providers or APPs (Physician Assistants and Nurse Practitioners) who all work together  to provide you with the care you need, when you need it.  Your next appointment:   6 month(s)  Provider:   Newman JINNY Lawrence, MD

## 2024-01-07 LAB — PRO B NATRIURETIC PEPTIDE: NT-Pro BNP: 1625 pg/mL — ABNORMAL HIGH (ref 0–376)

## 2024-01-08 ENCOUNTER — Ambulatory Visit: Payer: Self-pay | Admitting: Cardiology

## 2024-01-08 DIAGNOSIS — I5022 Chronic systolic (congestive) heart failure: Secondary | ICD-10-CM

## 2024-01-08 NOTE — Progress Notes (Signed)
 ProBNP is mildly elevated, suggesting fluid retention. This could also be due to recent Afib. However, given symptoms of leg edema, recommend increasing lasix  to 40 mg bid.  Thanks MJP

## 2024-01-09 MED ORDER — FUROSEMIDE 40 MG PO TABS: 40.0000 mg | ORAL_TABLET | Freq: Two times a day (BID) | ORAL | 1 refills | Status: DC

## 2024-01-09 NOTE — Progress Notes (Signed)
 Note read: Last read by [name] at 2:45PM on 01/09/2024.

## 2024-01-10 DIAGNOSIS — E1122 Type 2 diabetes mellitus with diabetic chronic kidney disease: Secondary | ICD-10-CM | POA: Diagnosis not present

## 2024-01-10 DIAGNOSIS — Z85850 Personal history of malignant neoplasm of thyroid: Secondary | ICD-10-CM | POA: Diagnosis not present

## 2024-01-10 DIAGNOSIS — M810 Age-related osteoporosis without current pathological fracture: Secondary | ICD-10-CM | POA: Diagnosis not present

## 2024-01-10 DIAGNOSIS — E890 Postprocedural hypothyroidism: Secondary | ICD-10-CM | POA: Diagnosis not present

## 2024-01-10 DIAGNOSIS — Z794 Long term (current) use of insulin: Secondary | ICD-10-CM | POA: Diagnosis not present

## 2024-01-14 DIAGNOSIS — I5022 Chronic systolic (congestive) heart failure: Secondary | ICD-10-CM | POA: Diagnosis not present

## 2024-01-15 ENCOUNTER — Ambulatory Visit: Payer: Self-pay | Admitting: Cardiovascular Disease

## 2024-01-15 LAB — CBC
Hematocrit: 52.8 % — ABNORMAL HIGH (ref 37.5–51.0)
Hemoglobin: 17.7 g/dL (ref 13.0–17.7)
MCH: 32.2 pg (ref 26.6–33.0)
MCHC: 33.5 g/dL (ref 31.5–35.7)
MCV: 96 fL (ref 79–97)
Platelets: 126 x10E3/uL — ABNORMAL LOW (ref 150–450)
RBC: 5.49 x10E6/uL (ref 4.14–5.80)
RDW: 13.3 % (ref 11.6–15.4)
WBC: 5.7 x10E3/uL (ref 3.4–10.8)

## 2024-01-15 LAB — BASIC METABOLIC PANEL WITH GFR
BUN/Creatinine Ratio: 21 (ref 10–24)
BUN: 35 mg/dL — ABNORMAL HIGH (ref 8–27)
CO2: 25 mmol/L (ref 20–29)
Calcium: 9.3 mg/dL (ref 8.6–10.2)
Chloride: 103 mmol/L (ref 96–106)
Creatinine, Ser: 1.68 mg/dL — ABNORMAL HIGH (ref 0.76–1.27)
Glucose: 123 mg/dL — ABNORMAL HIGH (ref 70–99)
Potassium: 3.8 mmol/L (ref 3.5–5.2)
Sodium: 143 mmol/L (ref 134–144)
eGFR: 43 mL/min/1.73 — ABNORMAL LOW (ref >59)

## 2024-01-21 ENCOUNTER — Telehealth: Payer: Self-pay | Admitting: Hematology

## 2024-01-21 NOTE — Telephone Encounter (Signed)
 Called pt to reschedule  lab

## 2024-02-06 ENCOUNTER — Other Ambulatory Visit: Payer: Medicare PPO

## 2024-02-06 ENCOUNTER — Telehealth (HOSPITAL_COMMUNITY): Payer: Self-pay

## 2024-02-06 ENCOUNTER — Ambulatory Visit: Payer: Medicare PPO | Admitting: Hematology

## 2024-02-06 DIAGNOSIS — I480 Paroxysmal atrial fibrillation: Secondary | ICD-10-CM

## 2024-02-06 DIAGNOSIS — Z01812 Encounter for preprocedural laboratory examination: Secondary | ICD-10-CM

## 2024-02-06 NOTE — Telephone Encounter (Signed)
 Attempted to reach patient to discuss upcoming procedure, no answer. Left VM for patient to return call.

## 2024-02-07 NOTE — Telephone Encounter (Signed)
 Spoke with patient to complete pre-procedure call.     Health status review:  Any new medical conditions, recent signs of acute illness or been started on antibiotics? No Any recent hospitalizations or surgeries? No Any new medications started since pre-op visit? No  Follow all medication instructions prior to procedure or the procedure may be rescheduled:    Continue taking Xarelto  (Rivaroxaban ) once daily without missing any doses before procedure. Essential chronic medications:  No medication should be continued, unless told otherwise. HOLD: Dulaglutide  (Trulicity ) for 1 week prior to the procedure. Last dose on OR before Monday, October 06.  HOLD: Dapagliflozin Pauletta) for 3 days prior to the procedure. Last dose on Saturday, October 11.  Lantus  (Insulin ): The day before your procedure only take  of your usual dinner/bedtime dose.  On the morning of your procedure DO NOT take any medication., including Xarelto  (Rivaroxaban ).  Nothing to eat or drink after midnight prior to your procedure.  Pre-procedure testing scheduled: lab work on September 23.  Confirmed patient is scheduled for Atrial Fibrillation Ablation on Wednesday, October 15 with Dr. Eulas Furbish. Instructed patient to arrive at the Main Entrance A at [facility]: [address] [place], [place] [address] and check in at Admitting at 9:00 AM.  Advised of plan to go home the same day and will only stay overnight if medically necessary. You MUST have a responsible adult to drive you home and MUST be with you the first 24 hours after you arrive home or your procedure could be cancelled.  Informed patient a nurse will call a day before the procedure to confirm arrival time and ensure instructions are followed.  Patient verbalized understanding to information provided and is agreeable to proceed with procedure.   Advised patient to contact RN Navigator at [phone], to inform of any new medications started after  call or concerns prior to procedure.

## 2024-02-11 ENCOUNTER — Ambulatory Visit (HOSPITAL_COMMUNITY)
Admission: RE | Admit: 2024-02-11 | Discharge: 2024-02-11 | Disposition: A | Discharge status: Home / Self Care | Source: Ambulatory Visit | Attending: Internal Medicine | Admitting: Internal Medicine

## 2024-02-11 ENCOUNTER — Inpatient Hospital Stay: Attending: Hematology | Admitting: Hematology

## 2024-02-11 ENCOUNTER — Inpatient Hospital Stay

## 2024-02-11 ENCOUNTER — Other Ambulatory Visit

## 2024-02-11 VITALS — BP 118/60 | HR 54 | Temp 97.6°F | Resp 14 | Ht 66.0 in | Wt 199.6 lb

## 2024-02-11 DIAGNOSIS — R600 Localized edema: Secondary | ICD-10-CM | POA: Diagnosis not present

## 2024-02-11 DIAGNOSIS — K862 Cyst of pancreas: Secondary | ICD-10-CM | POA: Diagnosis not present

## 2024-02-11 DIAGNOSIS — N183 Chronic kidney disease, stage 3 unspecified: Secondary | ICD-10-CM | POA: Diagnosis not present

## 2024-02-11 DIAGNOSIS — R161 Splenomegaly, not elsewhere classified: Secondary | ICD-10-CM | POA: Diagnosis not present

## 2024-02-11 DIAGNOSIS — I25810 Atherosclerosis of coronary artery bypass graft(s) without angina pectoris: Secondary | ICD-10-CM | POA: Insufficient documentation

## 2024-02-11 DIAGNOSIS — D696 Thrombocytopenia, unspecified: Secondary | ICD-10-CM | POA: Diagnosis not present

## 2024-02-11 DIAGNOSIS — I5022 Chronic systolic (congestive) heart failure: Secondary | ICD-10-CM | POA: Insufficient documentation

## 2024-02-11 LAB — ECHOCARDIOGRAM COMPLETE
AR max vel: 2.09 cm2
AV Area VTI: 2.18 cm2
AV Area mean vel: 2.09 cm2
AV Mean grad: 3.7 mmHg
AV Peak grad: 7.6 mmHg
Ao pk vel: 1.38 m/s
Area-P 1/2: 2.33 cm2
S' Lateral: 3.5 cm

## 2024-02-11 MED ORDER — PERFLUTREN LIPID MICROSPHERE
3.0000 mL | INTRAVENOUS | Status: AC | PRN
  Administered 2024-02-11: 3 mL via INTRAVENOUS

## 2024-02-11 NOTE — Progress Notes (Signed)
 EF is slightly lower than before. If no chest pain, do not need ischemia evaluation, but needs uptitration of GDMT for HFrEF. Recommend starting spironolactone  12.5 mg daily, check BMP and prBNP 1 week after that, followed by pharmD visit in a few weeks to consider up titration of Entresto  as tolerated by renal function and blood pressure. F/u w/me in Nov or Dec 2025.  Thanks MJP

## 2024-02-11 NOTE — Progress Notes (Signed)
 [facility]   [phone] [phone]   Clinic Follow up Note   Patient Care Team: Clarice Nottingham, MD as PCP - General (Internal Medicine) Mealor, Eulas BRAVO, MD as PCP - Electrophysiology (Cardiology) Elmira Newman PARAS, MD as PCP - Cardiology (Cardiology)  Date of Service:  02/11/2024  CHIEF COMPLAINT: f/u of thrombocytopenia  CURRENT THERAPY:  Observation  Oncology History   Thrombocytopenia -He has had a mild intermittent thrombocytopenia since 2009, with platelet in the range of 100-150K  -Workup in September 2024 was negative except mildly low B12.  Immature platelet count was normal. 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  His mild thrombocytopenia is stable, will continue monitoring.  Assessment & Plan Mild thrombocytopenia Platelet count is 126,000 with no significant change from previous levels. Count is above 100,000, which is generally safe for most procedures. Cardiologist is not concerned for upcoming cardiac ablation. - Cancel lab work for thrombocytopenia. - Schedule follow-up in one year.  Chronic kidney disease CKD is well-managed with no worsening.  Pancreatic cystic lesion under surveillance Cystic lesion in the pancreas appears benign. No biopsy recommended at this time.   Plan - I reviewed his recent lab and images - Continue monitoring CBC, I will see him back in 1 year.    Discussed the use of AI scribe software for clinical note transcription with the patient, who gave verbal consent to proceed.  History of Present Illness Nathan Medina is a 74 year old male with thrombocytopenia who presents for follow-up.  Thrombocytopenia is present with a platelet count of 126,000, without symptoms of bleeding or bruising. He takes vitamin B12 orally. Chronic kidney disease is stable, with normal calcium  levels and no anemia. A cystic lesion  in the pancreas is benign and under observation. A previously treated kidney lesion has decreased in size on follow-up imaging. He is preparing for a cardiac ablation in October, requiring a full blood panel within 30 days of the procedure.     All other systems were reviewed with the patient and are negative.  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   Bilateral renal cysts    Cervical spondylolysis    Moderate   CHF (congestive heart failure) (HCC) 07/24/2018   Chronic kidney disease    CKD stage 3 per office visit note 08/08/17 on chart    COLONIC POLYPS, HX OF    Coronary artery disease    DEPRESSION    DIABETES MELLITUS, TYPE II    Diverticulitis    Epidermal cyst    Fatty liver    GERD    GOUT    History of inguinal hernia    HOH (hard of hearing)    elft ear   HYPERLIPIDEMIA    HYPERTENSION    IBS    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, takedown and reversal September 2012   Ischemic cardiomyopathy 07/24/2018   Left renal mass 05/23/2011   Lumbar radicular pain 06/03/2011   Macular degeneration    right eye   Multinodular goiter    Myocardial infarction (HCC)    silent MI in Pt's 30s   Pancreatic pseudocyst    Stable  Peripheral vascular disease    Peritonsillar abscess    Pulmonary nodule    Right upper lobe   Radial neck fracture    Right upper lobe pulmonary nodule 07/24/2018   Thyroid  disease    Thyroid  nodule    Bilateral   Vertigo     SURGICAL HISTORY: Past Surgical History:  Procedure Laterality Date   APPENDECTOMY  02/12/11   COLON SURGERY  08/11/10   sigmoid colectomy   COLON SURGERY  02/12/11   colostomy takedown   COLONOSCOPY     CORONARY ARTERY BYPASS GRAFT N/A 04/01/2018   Procedure: CORONARY ARTERY BYPASS GRAFTING (CABG) x Three,  using left internal mammary artery and right leg greater saphenous vein harvested endoscopically;  Surgeon: Army Dallas NOVAK, MD;  Location: [facility] OR;  Service: Open Heart Surgery;  Laterality: N/A;   CYST REMOVAL TRUNK Right 02/20/2018   Procedure: epidermal cyst excision right lower back;  Surgeon: Eletha Boas, MD;  Location: [facility] ORS;  Service: General;  Laterality: Right;   HERNIA REPAIR     INSERTION OF MESH N/A 08/20/2012   Procedure: INSERTION OF MESH;  Surgeon: Deward GORMAN Curvin DOUGLAS, MD;  Location: [facility];  Service: General;  Laterality: N/A;   IR RADIOLOGIST EVAL & MGMT  03/23/2020   IR RADIOLOGIST EVAL & MGMT  07/26/2020   IR RADIOLOGIST EVAL & MGMT  11/01/2020   IR RADIOLOGIST EVAL & MGMT  05/10/2021   KNEE ARTHROSCOPY Right 10/18/2019   LEFT HEART CATH AND CORONARY ANGIOGRAPHY N/A 01/14/2018   Procedure: LEFT HEART CATH AND CORONARY ANGIOGRAPHY;  Surgeon: Elmira Newman PARAS, MD;  Location: [facility];  Service: Cardiovascular;  Laterality: N/A;   LYSIS OF ADHESION  08/20/2012   Procedure: LYSIS OF ADHESION;  Surgeon: Deward GORMAN Curvin DOUGLAS, MD;  Location: [facility];  Service: General;;   QUADRICEPS TENDON REPAIR Right 10/20/2019   Procedure: REPAIR RIGHT QUADRICEP TENDON;  Surgeon: Ernie Cough, MD;  Location: [facility] OR;  Service: Orthopedics;  Laterality: Right;   RADIOLOGY WITH ANESTHESIA N/A 06/22/2020   Procedure: CT WITH ANESTHESIA  CRYOABLATION;  Surgeon: Vanice Sharper, MD;  Location: [facility];  Service: Anesthesiology;  Laterality: N/A;   RADIOLOGY WITH ANESTHESIA N/A 07/06/2020   Procedure: CT WITH ANESTHESIA;  Surgeon: Vanice Sharper, MD;  Location: [facility];  Service: Anesthesiology;  Laterality: N/A;   SEPTOPLASTY  age 52   TEE WITHOUT CARDIOVERSION N/A 04/01/2018   Procedure: TRANSESOPHAGEAL ECHOCARDIOGRAM (TEE);  Surgeon: Army Dallas NOVAK, MD;  Location: [facility] OR;  Service: Open Heart Surgery;  Laterality: N/A;   THYROIDECTOMY N/A 10/17/2017   Procedure: TOTAL THYROIDECTOMY;  Surgeon: Eletha Boas, MD;  Location: [facility];  Service: General;  Laterality: N/A;   TONSILLECTOMY Right 08/01/2016   Procedure: INCISION AND DRAINAGE RIGHT PERI-TONSILLAR ABSCESS;  Surgeon: Marlyce Finer, MD;  Location: [facility];  Service: ENT;  Laterality: Right;   ULTRASOUND GUIDANCE FOR VASCULAR ACCESS  01/14/2018   Procedure: Ultrasound Guidance For Vascular Access;  Surgeon: Elmira Newman PARAS, MD;  Location: [facility];  Service: Cardiovascular;;   VENTRAL HERNIA REPAIR  08/20/2012   Procedure: HERNIA REPAIR VENTRAL ADULT;  Surgeon: Deward GORMAN Curvin DOUGLAS, MD;  Location: [facility];  Service: General;;    I have reviewed the social history and family history with the patient and they are unchanged from previous note.  ALLERGIES:  is allergic to ciprofloxacin , escitalopram oxalate, gadolinium derivatives, quinapril hcl, and sulfa antibiotics.  MEDICATIONS:  Current Outpatient Medications  Medication  Sig Dispense Refill   acetaminophen  (TYLENOL ) 500 MG tablet 1 tablet as needed     allopurinol  (ZYLOPRIM ) 300 MG tablet Take 1 tablet by mouth daily.     ALPRAZolam  (XANAX ) 0.5 MG tablet Take 1 tablet (0.5 mg total) by mouth daily as needed for sleep. 1/2 - 1 by mouth once daily as needed (Patient taking differently: Take 0.25-0.5 mg by mouth at bedtime as needed for sleep.) 90 tablet 2   aspirin -acetaminophen -caffeine (EXCEDRIN EXTRA STRENGTH) 250-250-65 MG tablet 2 tablets as needed     atorvastatin  (LIPITOR) 10 MG tablet Take 10 mg by mouth at bedtime.     carvedilol  (COREG ) 6.25 MG tablet Take 1 tablet (6.25 mg total) by mouth 2 (two) times daily. 180 tablet 3   Cholecalciferol (VITAMIN D-3) 25 MCG (1000 UT) CAPS Take 1,000 Units by mouth at bedtime.     dapagliflozin propanediol (FARXIGA) 5 MG TABS tablet Take 5 mg by mouth daily.     diphenhydrAMINE  (BENADRYL ) 25 MG tablet Take 25 mg by mouth at bedtime as needed for sleep.     famotidine  (PEPCID ) 40 MG tablet Take 40 mg by mouth as needed for heartburn or  indigestion.     furosemide  (LASIX ) 40 MG tablet Take 1 tablet (40 mg total) by mouth 2 (two) times daily. 180 tablet 1   gabapentin (NEURONTIN) 100 MG capsule 1-3 capsules     guaiFENesin  (MUCINEX ) 600 MG 12 hr tablet Take 1,200 mg by mouth 2 (two) times daily as needed for cough or to loosen phlegm.     insulin  glargine (LANTUS ) 100 unit/mL SOPN Inject 44 Units into the skin at bedtime. (Patient taking differently: Inject 32 Units into the skin at bedtime.)     levothyroxine  (SYNTHROID ) 175 MCG tablet Take 224 mcg by mouth daily before breakfast. (Patient taking differently: Take 200 mcg by mouth daily before breakfast.)     Multiple Vitamins-Minerals (PRESERVISION AREDS 2 PO) Take 1 tablet by mouth 2 (two) times daily.     omega-3 acid ethyl esters (LOVAZA ) 1 g capsule Take 2 g by mouth 2 (two) times daily.      pantoprazole  (PROTONIX ) 40 MG tablet Take 40 mg by mouth every morning.     rivaroxaban  (XARELTO ) 2.5 MG TABS tablet Take 1 tablet by mouth twice daily (Patient taking differently: Take 15 mg by mouth daily.) 180 tablet 1   sacubitril -valsartan  (ENTRESTO ) 49-51 MG Take 1 tablet by mouth twice daily 180 tablet 3   sildenafil  (REVATIO ) 20 MG tablet Take 100 mg by mouth daily as needed (ED).     tadalafil  (CIALIS ) 5 MG tablet 1 tablet Once a day     triamcinolone  cream (KENALOG ) 0.1 % Apply 1 application topically 2 (two) times daily as needed (skin irritation).     TRULICITY  0.75 MG/0.5ML SOPN Inject 0.75 mg into the skin once a week. Monday     No current facility-administered medications for this visit.    PHYSICAL EXAMINATION: ECOG PERFORMANCE STATUS: 0 - Asymptomatic  Vitals:   02/11/24 1346  BP: 118/60  Pulse: (!) 54  Resp: 14  Temp: 97.6 F (36.4 C)  SpO2: 97%   Wt Readings from Last 3 Encounters:  02/11/24 199 lb 9.6 oz (90.5 kg)  01/06/24 202 lb 9.6 oz (91.9 kg)  01/03/24 201 lb (91.2 kg)     GENERAL:alert, no distress and comfortable SKIN: skin color, texture,  turgor are normal, no rashes or significant lesions EYES: normal, Conjunctiva are pink and non-injected,  sclera clear Musculoskeletal:no cyanosis of digits and no clubbing  NEURO: alert & oriented x 3 with fluent speech, no focal motor/sensory deficits  Physical Exam    LABORATORY DATA:  I have reviewed the data as listed    Latest Ref Rng & Units 01/14/2024    9:39 AM 05/30/2023    2:00 PM 01/30/2023    3:50 PM  CBC  WBC 3.4 - 10.8 x10E3/uL 5.7  6.2  6.3   Hemoglobin 13.0 - 17.7 g/dL 82.2  82.9  83.3   Hematocrit 37.5 - 51.0 % 52.8  49.8  49.8   Platelets 150 - 450 x10E3/uL 126  104  119         Latest Ref Rng & Units 01/14/2024    9:39 AM 04/30/2022    9:01 AM 07/07/2020    8:10 AM  CMP  Glucose 70 - 99 mg/dL 876   797   BUN 8 - 27 mg/dL 35   39   Creatinine 9.23 - 1.27 mg/dL 8.31  8.59  8.05   Sodium 134 - 144 mmol/L 143   138   Potassium 3.5 - 5.2 mmol/L 3.8   4.4   Chloride 96 - 106 mmol/L 103   104   CO2 20 - 29 mmol/L 25   25   Calcium  8.6 - 10.2 mg/dL 9.3   8.6       RADIOGRAPHIC STUDIES: I have personally reviewed the radiological images as listed and agreed with the findings in the report. ECHOCARDIOGRAM COMPLETE Result Date: 02/11/2024    ECHOCARDIOGRAM REPORT   Patient Name:   Nathan Medina Date of Exam: 02/11/2024 Medical Rec #:  [id number]       Height:       66.0 in Accession #:    [id number]      Weight:       202.6 lb Date of Birth:  Mar 18, 1951      BSA:          2.011 m Patient Age:    72 years        BP:           114/60 mmHg Patient Gender: M               HR:           60 bpm. Exam Location:  [address] Procedure: 2D Echo, Cardiac Doppler, Color Doppler and Intracardiac            Opacification Agent (Both Spectral and Color Flow Doppler were            utilized during procedure). Indications:    R60.0 Leg Edema  History:        Patient has prior history of Echocardiogram examinations, most                 recent 02/06/2022. CAD, Prior CABG,  Arrythmias:Paroxysmal atrial                 fibrillation, Signs/Symptoms:Edema; Risk Factors:Hypertension                 and Diabetes.  Sonographer:    Elsie Bohr RDCS Referring Phys: [id number] [facility] J PATWARDHAN  Sonographer Comments: Technically difficult study due to poor echo windows. IMPRESSIONS  1. Left ventricular ejection fraction, by estimation, is 40 to 45%. The left ventricle has mildly decreased function. The left ventricle demonstrates regional wall motion abnormalities (see scoring diagram/findings for description). There is mild left ventricular hypertrophy.  Left ventricular diastolic parameters are consistent with Grade I diastolic dysfunction (impaired relaxation).  2. Right ventricular systolic function is mildly reduced. The right ventricular size is normal. Tricuspid regurgitation signal is inadequate for assessing PA pressure.  3. Left atrial size was mildly dilated.  4. The mitral valve is normal in structure. Trivial mitral valve regurgitation. No evidence of mitral stenosis.  5. The aortic valve was not well visualized. Aortic valve regurgitation is trivial. No aortic stenosis is present.  6. Aortic dilatation noted. There is mild dilatation of the ascending aorta, measuring 39 mm.  7. The inferior vena cava is normal in size with greater than 50% respiratory variability, suggesting right atrial pressure of 3 mmHg. FINDINGS  Left Ventricle: Left ventricular ejection fraction, by estimation, is 40 to 45%. The left ventricle has mildly decreased function. The left ventricle demonstrates regional wall motion abnormalities. Definity  contrast agent was given IV to delineate the left ventricular endocardial borders. The left ventricular internal cavity size was normal in size. There is mild left ventricular hypertrophy. Left ventricular diastolic parameters are consistent with Grade I diastolic dysfunction (impaired relaxation).  LV Wall Scoring: The apical septal segment, apical anterior  segment, apical inferior segment, and apex are akinetic. The mid anteroseptal segment and mid inferoseptal segment are hypokinetic. The anterior wall, entire lateral wall, inferior wall, basal anteroseptal segment, and basal inferoseptal segment are normal. Right Ventricle: The right ventricular size is normal. No increase in right ventricular wall thickness. Right ventricular systolic function is mildly reduced. Tricuspid regurgitation signal is inadequate for assessing PA pressure. Left Atrium: Left atrial size was mildly dilated. Right Atrium: Right atrial size was normal in size. Pericardium: There is no evidence of pericardial effusion. Mitral Valve: The mitral valve is normal in structure. Trivial mitral valve regurgitation. No evidence of mitral valve stenosis. Tricuspid Valve: The tricuspid valve is normal in structure. Tricuspid valve regurgitation is trivial. Aortic Valve: The aortic valve was not well visualized. Aortic valve regurgitation is trivial. No aortic stenosis is present. Aortic valve mean gradient measures 3.7 mmHg. Aortic valve peak gradient measures 7.6 mmHg. Aortic valve area, by VTI measures 2.18 cm. Pulmonic Valve: The pulmonic valve was not well visualized. Pulmonic valve regurgitation is trivial. Aorta: The aortic root is normal in size and structure and aortic dilatation noted. There is mild dilatation of the ascending aorta, measuring 39 mm. Venous: The inferior vena cava is normal in size with greater than 50% respiratory variability, suggesting right atrial pressure of 3 mmHg. IAS/Shunts: The interatrial septum was not well visualized.  LEFT VENTRICLE PLAX 2D LVIDd:         4.70 cm   Diastology LVIDs:         3.50 cm   LV e' medial:    5.11 cm/s LV PW:         1.10 cm   LV E/e' medial:  13.2 LV IVS:        1.10 cm   LV e' lateral:   6.53 cm/s LVOT diam:     2.20 cm   LV E/e' lateral: 10.4 LV SV:         63 LV SV Index:   31 LVOT Area:     3.80 cm  RIGHT VENTRICLE             IVC RV  S prime:     10.30 cm/s  IVC diam: 1.90 cm TAPSE (M-mode): 1.6 cm LEFT ATRIUM  Index        RIGHT ATRIUM           Index LA diam:      4.60 cm 2.29 cm/m   RA Pressure: 3.00 mmHg LA Vol (A4C): 79.1 ml 39.34 ml/m  RA Area:     15.70 cm                                    RA Volume:   38.70 ml  19.25 ml/m  AORTIC VALVE AV Area (Vmax):    2.09 cm AV Area (Vmean):   2.09 cm AV Area (VTI):     2.18 cm AV Vmax:           137.67 cm/s AV Vmean:          90.967 cm/s AV VTI:            0.290 m AV Peak Grad:      7.6 mmHg AV Mean Grad:      3.7 mmHg LVOT Vmax:         75.83 cm/s LVOT Vmean:        50.033 cm/s LVOT VTI:          0.166 m LVOT/AV VTI ratio: 0.57  AORTA Ao Root diam: 3.60 cm Ao Asc diam:  3.90 cm MITRAL VALVE               TRICUSPID VALVE MV Area (PHT): 2.33 cm    Estimated RAP:  3.00 mmHg MV Decel Time: 325 msec MV E velocity: 67.60 cm/s  SHUNTS MV A velocity: 87.50 cm/s  Systemic VTI:  0.17 m MV E/A ratio:  0.77        Systemic Diam: 2.20 cm Lonni Nanas MD Electronically signed by Lonni Nanas MD Signature Date/Time: 02/11/2024/4:20:42 PM    Final       No orders of the defined types were placed in this encounter.  All questions were answered. The patient knows to call the clinic with any problems, questions or concerns. No barriers to learning was detected. The total time spent in the appointment was 15 minutes, including review of chart and various tests results, discussions about plan of care and coordination of care plan     Onita Mattock, MD 02/11/2024

## 2024-02-11 NOTE — Assessment & Plan Note (Signed)
-  He has had a mild intermittent thrombocytopenia since 2009, with platelet in the range of 100-150K  -Workup in September 2024 was negative except mildly low B12.  Immature platelet count was normal. 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  His mild thrombocytopenia is stable, will continue monitoring.

## 2024-02-12 NOTE — Progress Notes (Signed)
 In the past, he his EF had improved and I did not feel benefits of spironolactone  outweighed the risks.  With the drop in EF, I think he should reconsider spironolactone .  While renal function is not normal, it has been stable.  We can certainly try 12.5 mg spironolactone  and check BMP and proBNP within 1 week of starting the medication to ensure kidneys tolerate this medication now.  Thanks MJP

## 2024-02-13 MED ORDER — SPIRONOLACTONE 25 MG PO TABS: 12.5000 mg | ORAL_TABLET | Freq: Every day | ORAL | 3 refills | Status: AC

## 2024-02-21 DIAGNOSIS — I5022 Chronic systolic (congestive) heart failure: Secondary | ICD-10-CM | POA: Diagnosis not present

## 2024-02-22 LAB — PRO B NATRIURETIC PEPTIDE: NT-Pro BNP: 582 pg/mL — ABNORMAL HIGH (ref 0–376)

## 2024-02-24 ENCOUNTER — Other Ambulatory Visit (HOSPITAL_COMMUNITY): Payer: Self-pay

## 2024-02-24 DIAGNOSIS — I480 Paroxysmal atrial fibrillation: Secondary | ICD-10-CM

## 2024-02-24 DIAGNOSIS — Z01812 Encounter for preprocedural laboratory examination: Secondary | ICD-10-CM

## 2024-02-25 ENCOUNTER — Other Ambulatory Visit: Payer: Self-pay | Admitting: Interventional Radiology

## 2024-02-25 DIAGNOSIS — Z01812 Encounter for preprocedural laboratory examination: Secondary | ICD-10-CM | POA: Diagnosis not present

## 2024-02-25 DIAGNOSIS — N2889 Other specified disorders of kidney and ureter: Secondary | ICD-10-CM

## 2024-02-25 DIAGNOSIS — I480 Paroxysmal atrial fibrillation: Secondary | ICD-10-CM | POA: Diagnosis not present

## 2024-02-25 DIAGNOSIS — E890 Postprocedural hypothyroidism: Secondary | ICD-10-CM | POA: Diagnosis not present

## 2024-02-26 ENCOUNTER — Encounter: Payer: Self-pay | Admitting: Cardiovascular Disease

## 2024-02-26 LAB — BASIC METABOLIC PANEL WITH GFR
BUN/Creatinine Ratio: 40 — ABNORMAL HIGH (ref 10–24)
BUN: 60 mg/dL — ABNORMAL HIGH (ref 8–27)
CO2: 21 mmol/L (ref 20–29)
Calcium: 9.5 mg/dL (ref 8.6–10.2)
Chloride: 103 mmol/L (ref 96–106)
Creatinine, Ser: 1.49 mg/dL — ABNORMAL HIGH (ref 0.76–1.27)
Glucose: 166 mg/dL — ABNORMAL HIGH (ref 70–99)
Potassium: 4.3 mmol/L (ref 3.5–5.2)
Sodium: 142 mmol/L (ref 134–144)
eGFR: 49 mL/min/1.73 — ABNORMAL LOW (ref >59)

## 2024-02-26 LAB — CBC
Hematocrit: 51.5 % — ABNORMAL HIGH (ref 37.5–51.0)
Hemoglobin: 16.9 g/dL (ref 13.0–17.7)
MCH: 32.1 pg (ref 26.6–33.0)
MCHC: 32.8 g/dL (ref 31.5–35.7)
MCV: 98 fL — ABNORMAL HIGH (ref 79–97)
Platelets: 107 x10E3/uL — ABNORMAL LOW (ref 150–450)
RBC: 5.27 x10E6/uL (ref 4.14–5.80)
RDW: 13.1 % (ref 11.6–15.4)
WBC: 5.9 x10E3/uL (ref 3.4–10.8)

## 2024-03-02 ENCOUNTER — Ambulatory Visit: Payer: Self-pay | Admitting: Cardiovascular Disease

## 2024-03-03 ENCOUNTER — Other Ambulatory Visit: Payer: Self-pay | Admitting: Cardiology

## 2024-03-03 NOTE — Pre-Procedure Instructions (Signed)
 Instructed patient on the following items: Arrival time 0830 Nothing to eat or drink after midnight No meds AM of procedure Responsible person to drive you home and stay with you for 24 hrs  Have you missed any doses of anti-coagulant Xarelto - takes once a day, missed a dose last week.   Will do EKG on arrival.

## 2024-03-04 ENCOUNTER — Encounter (HOSPITAL_COMMUNITY)
Admission: RE | Disposition: A | Discharge status: Home / Self Care | Payer: Self-pay | Source: Home / Self Care | Attending: Cardiovascular Disease

## 2024-03-04 ENCOUNTER — Other Ambulatory Visit: Payer: Self-pay

## 2024-03-04 ENCOUNTER — Ambulatory Visit (HOSPITAL_COMMUNITY)
Admission: RE | Admit: 2024-03-04 | Discharge: 2024-03-04 | Disposition: A | Discharge status: Home / Self Care | Attending: Cardiovascular Disease | Admitting: Cardiovascular Disease

## 2024-03-04 ENCOUNTER — Ambulatory Visit (HOSPITAL_COMMUNITY): Admitting: Anesthesiology

## 2024-03-04 DIAGNOSIS — Z538 Procedure and treatment not carried out for other reasons: Secondary | ICD-10-CM | POA: Insufficient documentation

## 2024-03-04 DIAGNOSIS — I480 Paroxysmal atrial fibrillation: Secondary | ICD-10-CM | POA: Diagnosis not present

## 2024-03-04 LAB — GLUCOSE, CAPILLARY: Glucose-Capillary: 154 mg/dL — ABNORMAL HIGH (ref 70–99)

## 2024-03-04 SURGERY — ATRIAL FIBRILLATION ABLATION
Anesthesia: General

## 2024-03-04 MED ORDER — SODIUM CHLORIDE 0.9 % IV SOLN: INTRAVENOUS | Status: DC

## 2024-03-04 MED ORDER — NYSTATIN 100000 UNIT/GM EX POWD: 1.0000 | Freq: Three times a day (TID) | CUTANEOUS | 0 refills | Status: DC

## 2024-03-04 NOTE — Anesthesia Preprocedure Evaluation (Addendum)
 Anesthesia Evaluation  Patient identified by MRN, date of birth, ID band Patient awake    Reviewed: Allergy & Precautions, NPO status , Patient's Chart, lab work & pertinent test results, reviewed documented beta blocker date and time   Airway Mallampati: II  TM Distance: >3 FB Neck ROM: Full    Dental  (+) Teeth Intact, Dental Advisory Given   Pulmonary neg pulmonary ROS   Pulmonary exam normal breath sounds clear to auscultation       Cardiovascular hypertension, Pt. on medications and Pt. on home beta blockers + CAD, + Past MI, + CABG, + Peripheral Vascular Disease and +CHF  Normal cardiovascular exam Rhythm:Regular Rate:Normal  Echo 02/11/24: 1. Left ventricular ejection fraction, by estimation, is 40 to 45%. The  left ventricle has mildly decreased function. The left ventricle  demonstrates regional wall motion abnormalities (see scoring  diagram/findings for description). There is mild left  ventricular hypertrophy. Left ventricular diastolic parameters are  consistent with Grade I diastolic dysfunction (impaired relaxation).   2. Right ventricular systolic function is mildly reduced. The right  ventricular size is normal. Tricuspid regurgitation signal is inadequate  for assessing PA pressure.   3. Left atrial size was mildly dilated.   4. The mitral valve is normal in structure. Trivial mitral valve  regurgitation. No evidence of mitral stenosis.   5. The aortic valve was not well visualized. Aortic valve regurgitation  is trivial. No aortic stenosis is present.   6. Aortic dilatation noted. There is mild dilatation of the ascending  aorta, measuring 39 mm.   7. The inferior vena cava is normal in size with greater than 50%  respiratory variability, suggesting right atrial pressure of 3 mmHg.     Neuro/Psych  PSYCHIATRIC DISORDERS Anxiety Depression    negative neurological ROS     GI/Hepatic Neg liver ROS,GERD   Medicated,,  Endo/Other  diabetes, Type 2, Insulin  Dependent, Oral Hypoglycemic AgentsHypothyroidism  Obesity   Renal/GU Renal InsufficiencyRenal disease     Musculoskeletal  (+) Arthritis ,    Abdominal   Peds  Hematology  (+) Blood dyscrasia (Xarelto )   Anesthesia Other Findings Day of surgery medications reviewed with the patient.  Reproductive/Obstetrics                              Anesthesia Physical Anesthesia Plan  ASA: 3  Anesthesia Plan: General   Post-op Pain Management:    Induction: Intravenous  PONV Risk Score and Plan: 2 and Dexamethasone  and Ondansetron   Airway Management Planned: Oral ETT  Additional Equipment:   Intra-op Plan:   Post-operative Plan: Extubation in OR  Informed Consent: I have reviewed the patients History and Physical, chart, labs and discussed the procedure including the risks, benefits and alternatives for the proposed anesthesia with the patient or authorized representative who has indicated his/her understanding and acceptance.     Dental advisory given  Plan Discussed with: CRNA  Anesthesia Plan Comments:          Anesthesia Quick Evaluation

## 2024-03-05 ENCOUNTER — Encounter: Payer: Self-pay | Admitting: Cardiovascular Disease

## 2024-03-05 MED ORDER — SACUBITRIL-VALSARTAN 49-51 MG PO TABS: 1.0000 | ORAL_TABLET | Freq: Two times a day (BID) | ORAL | 3 refills | Status: DC

## 2024-03-05 MED ORDER — NYSTATIN 100000 UNIT/GM EX POWD: 1.0000 | Freq: Three times a day (TID) | CUTANEOUS | 0 refills | Status: AC

## 2024-03-05 NOTE — H&P (Signed)
 Case was rescheduled.

## 2024-03-12 ENCOUNTER — Telehealth: Payer: Self-pay | Admitting: Pharmacy Technician

## 2024-03-12 ENCOUNTER — Other Ambulatory Visit (HOSPITAL_COMMUNITY): Payer: Self-pay

## 2024-03-12 NOTE — Telephone Encounter (Signed)
 Pharmacy Patient Advocate Encounter  Received notification from [employer] that Prior Authorization for nystatin  has been APPROVED from 05/22/23 to 05/20/25. Ran test claim, Copay is $0.00- . This test claim was processed through [facility]- copay amounts may vary at other pharmacies due to pharmacy/plan contracts, or as the patient moves through the different stages of their insurance plan.   PA #/Case ID/Reference #: [id number]

## 2024-03-12 NOTE — Telephone Encounter (Signed)
 Pharmacy Patient Advocate Encounter   Received notification from Patient Pharmacy that prior authorization for Nystatin  is required/requested.   Insurance verification completed.   The patient is insured through [employer].   Per test claim: PA required; PA submitted to above mentioned insurance via Latent Key/confirmation #/EOC [id number] Status is pending

## 2024-03-13 ENCOUNTER — Encounter: Payer: Self-pay | Admitting: Cardiovascular Disease

## 2024-03-18 ENCOUNTER — Encounter: Payer: Self-pay | Admitting: Pharmacist

## 2024-03-18 ENCOUNTER — Telehealth (HOSPITAL_COMMUNITY): Payer: Self-pay

## 2024-03-18 ENCOUNTER — Encounter (HOSPITAL_COMMUNITY): Payer: Self-pay

## 2024-03-18 ENCOUNTER — Ambulatory Visit: Attending: Cardiology | Admitting: Pharmacist

## 2024-03-18 VITALS — BP 115/79 | HR 68

## 2024-03-18 DIAGNOSIS — I5020 Unspecified systolic (congestive) heart failure: Secondary | ICD-10-CM | POA: Diagnosis not present

## 2024-03-18 DIAGNOSIS — I5022 Chronic systolic (congestive) heart failure: Secondary | ICD-10-CM | POA: Diagnosis not present

## 2024-03-18 DIAGNOSIS — I480 Paroxysmal atrial fibrillation: Secondary | ICD-10-CM

## 2024-03-18 DIAGNOSIS — Z01812 Encounter for preprocedural laboratory examination: Secondary | ICD-10-CM

## 2024-03-18 LAB — BASIC METABOLIC PANEL WITH GFR
BUN/Creatinine Ratio: 26 — ABNORMAL HIGH (ref 10–24)
BUN: 50 mg/dL — ABNORMAL HIGH (ref 8–27)
CO2: 25 mmol/L (ref 20–29)
Calcium: 10.1 mg/dL (ref 8.6–10.2)
Chloride: 100 mmol/L (ref 96–106)
Creatinine, Ser: 1.91 mg/dL — ABNORMAL HIGH (ref 0.76–1.27)
Glucose: 174 mg/dL — ABNORMAL HIGH (ref 70–99)
Potassium: 4.4 mmol/L (ref 3.5–5.2)
Sodium: 140 mmol/L (ref 134–144)
eGFR: 37 mL/min/1.73 — ABNORMAL LOW (ref >59)

## 2024-03-18 MED ORDER — SACUBITRIL-VALSARTAN 97-103 MG PO TABS: 1.0000 | ORAL_TABLET | Freq: Two times a day (BID) | ORAL | 3 refills | Status: AC

## 2024-03-18 NOTE — Progress Notes (Signed)
 Patient ID: BARY LIMBACH                 DOB: 1951/04/08                      MRN: [id number]     HPI: Nathan Medina is a 73 y.o. male referred by Dr. Elmira  to pharmacy clinic for HF medication management. PMH is significant for  hypertension, CKD stage III, and 2 diabetes mellitus, ischemic cardiomyopathy with recovered EF, coronary artery disease s/p CABGX3 (LIMA-LAD, SVG-dRCA, SVG-ramus) in 2019, paroxysmal A-fib,  stable renal and lung nodules, s/p thyroidectomy, stable thrombocytopenia  . Most recent LVEF 40-45 % on 01/2024 .  Hx of improved EF but recent EF dropped so 01/2024 spironolactone  12.5 mg was added to other HF medications . K level and renal function was WNL post addition and NT pro BNP went down to 582 from 1625 (2 months ago).   Currently pt is on  carvedilol  6.25 mg twice daily, Farxiga 5 mg daily, Entresto  49-51 mg twice daily, spironolactone  12.5 mg daily. CrCl 55 mL/min (SrCr 1.49)  patient reports he ash been tolerating current BP meds well. Did not bring home BP log reports his home BP ~120/80 range ( recalls from memory)     Discussion with patient today included the following: cardiac medication indications, introduction to GDMT clinic, reasoning behind medication titration, importance of medication adherence, and patient engagement. . Symptomatically, he is feeling well, Denies dizziness, lightheadedness, and fatigue. Denies  chest pain or palpitations. Able to complete all ADLs. Activity level active. he does not check his weight at home. Denies LEE, PND, or orthopnea. Appetite has been Normal. he mostly  adheres to a low-salt diet.  Will be going for Afib ablation on 04/07/2024  and getting bunch of labs drawn today   Current CHF meds: carvedilol  6.25 mg twice daily, Farxiga 5 mg daily, Entresto  49-51 mg twice daily, spironolactone  12.5 mg daily , lasix  40 mg twice daily   Adherence Assessment  Do you ever forget to take your medication? [] Yes [x] No  Do  you ever skip doses due to side effects? [] Yes [x] No  Do you have trouble affording your medicines? [] Yes [x] No  Are you ever unable to pick up your medication due to transportation difficulties? [] Yes [x] No  Do you ever stop taking your medications because you don't believe they are helping? [] Yes [x] No  Do you check your weight daily?  [] Yes [x] No   Adherence strategy: pill box   Barriers to obtaining medications: none   BP goal: <130/80   Family History:  Relation Problem Comments  Mother (Deceased at age 24)   Father (Deceased at age 74) Died from MI at age 81   Sister (Alive)   Paternal Aunt Hypertension     Maternal Grandmother Hypertension   Stroke in her 44's       Social History:  Alcohol: rarely  Smoking: none  Diet: follows low carb and low fat diet   Exercise: Gym once a week- balance and strength building Walks -  laps around the house   Home BP readings: 120/80   Wt Readings from Last 3 Encounters:  03/04/24 194 lb (88 kg)  02/11/24 199 lb 9.6 oz (90.5 kg)  01/06/24 202 lb 9.6 oz (91.9 kg)   BP Readings from Last 3 Encounters:  03/18/24 115/79  03/04/24 (!) 146/60  02/11/24 118/60   Pulse Readings from Last 3 Encounters:  03/18/24 68  03/04/24 60  02/11/24 (!) 54    Renal function: CrCl cannot be calculated (Patient's most recent lab result is older than the maximum 21 days allowed.).  Past Medical History:  Diagnosis Date   Abnormal liver function test 05/23/2011   ALLERGIC RHINITIS    ANXIETY    Aortic atherosclerosis    Arthritis    Ascending aorta dilation 07/24/2018   Bilateral renal cysts    Cervical spondylolysis    Moderate   CHF (congestive heart failure) (HCC) 07/24/2018   Chronic kidney disease    CKD stage 3 per office visit note 08/08/17 on chart    COLONIC POLYPS, HX OF    Coronary artery disease    DEPRESSION    DIABETES MELLITUS, TYPE II    Diverticulitis    Epidermal cyst    Fatty liver    GERD    GOUT     History of inguinal hernia    HOH (hard of hearing)    elft ear   HYPERLIPIDEMIA    HYPERTENSION    IBS    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, takedown and reversal September 2012   Ischemic cardiomyopathy 07/24/2018   Left renal mass 05/23/2011   Lumbar radicular pain 06/03/2011   Macular degeneration    right eye   Multinodular goiter    Myocardial infarction [facility])    silent MI in Pt's 30s   Pancreatic pseudocyst    Stable   Peripheral vascular disease    Peritonsillar abscess    Pulmonary nodule    Right upper lobe   Radial neck fracture    Right upper lobe pulmonary nodule 07/24/2018   Thyroid  disease    Thyroid  nodule    Bilateral   Vertigo     Current Outpatient Medications on File Prior to Visit  Medication Sig Dispense Refill   acetaminophen  (TYLENOL ) 500 MG tablet Take 500-1,000 mg by mouth every 8 (eight) hours as needed for mild pain (pain score 1-3) or moderate pain (pain score 4-6).     allopurinol  (ZYLOPRIM ) 300 MG tablet Take 300 mg by mouth daily.     ALPRAZolam  (XANAX ) 0.5 MG tablet Take 1 tablet (0.5 mg total) by mouth daily as needed for sleep. 1/2 - 1 by mouth once daily as needed 90 tablet 2   aspirin -acetaminophen -caffeine (EXCEDRIN EXTRA STRENGTH) 250-250-65 MG tablet Take 2 tablets by mouth every 8 (eight) hours as needed for headache or migraine.     atorvastatin  (LIPITOR) 10 MG tablet Take 10 mg by mouth at bedtime.     carvedilol  (COREG ) 6.25 MG tablet Take 1 tablet (6.25 mg total) by mouth 2 (two) times daily. 180 tablet 3   Cholecalciferol (VITAMIN D-3) 25 MCG (1000 UT) CAPS Take 1,000 Units by mouth at bedtime.     cyanocobalamin  (VITAMIN B12) 1000 MCG tablet Take 1,000 mcg by mouth daily.     dapagliflozin propanediol (FARXIGA) 5 MG TABS tablet Take 5 mg by mouth daily.     Dulaglutide  1.5 MG/0.5ML SOAJ Inject 0.75 mg into the skin once a week. Monday     famotidine  (PEPCID ) 40 MG tablet Take 40 mg by  mouth at bedtime as needed for heartburn or indigestion.     furosemide  (LASIX ) 40 MG tablet Take 1 tablet (40 mg total) by mouth 2 (two) times daily. (Patient taking differently: Take 40 mg by mouth 2 (two) times daily with breakfast and lunch.) 180 tablet 1   gabapentin (NEURONTIN)  100 MG capsule Take 200 mg by mouth at bedtime.     guaiFENesin  (MUCINEX ) 600 MG 12 hr tablet Take 1,200 mg by mouth 2 (two) times daily as needed for cough or to loosen phlegm.     ibuprofen (ADVIL) 200 MG tablet Take 200 mg by mouth every 6 (six) hours as needed for mild pain (pain score 1-3), headache or moderate pain (pain score 4-6).     insulin  glargine (LANTUS ) 100 unit/mL SOPN Inject 44 Units into the skin at bedtime. (Patient taking differently: Inject 32 Units into the skin at bedtime.)     levothyroxine  (SYNTHROID ) 200 MCG tablet Take 200 mcg by mouth daily before breakfast.     Multiple Vitamins-Minerals (PRESERVISION AREDS 2 PO) Take 1 tablet by mouth 2 (two) times daily.     nystatin  (MYCOSTATIN /NYSTOP ) powder Apply 1 Application topically 3 (three) times daily. 180 g 0   omega-3 acid ethyl esters (LOVAZA ) 1 g capsule Take 2 g by mouth 2 (two) times daily.      pantoprazole  (PROTONIX ) 40 MG tablet Take 40 mg by mouth every morning.     sildenafil  (VIAGRA ) 100 MG tablet Take 100 mg by mouth daily as needed for erectile dysfunction.     spironolactone  (ALDACTONE ) 25 MG tablet Take 0.5 tablets (12.5 mg total) by mouth daily. (Patient taking differently: Take 25 mg by mouth in the morning.) 45 tablet 3   tadalafil  (CIALIS ) 5 MG tablet Take 5 mg by mouth in the morning.     triamcinolone  cream (KENALOG ) 0.1 % Apply 1 application topically 2 (two) times daily as needed (skin irritation).     XARELTO  15 MG TABS tablet Take 15 mg by mouth daily with supper.     No current facility-administered medications on file prior to visit.    Allergies  Allergen Reactions   Ciprofloxacin  Rash   Escitalopram Oxalate  Itching   Gadolinium Derivatives Itching    Pt described sneezing/itchy nose with previous MRI contrast exams, Dr Marthann wants patient to have 13 hour prep for contrast exams; [facility]   Quinapril Hcl Rash   Sulfa Antibiotics Swelling    Swelling in the ankles     Assessment/Plan:  1. CHF -  Problem  Hfref (Heart Failure With Reduced Ejection Fraction) (Hcc)   Current CHF meds: carvedilol  6.25 mg twice daily, Farxiga 5 mg daily, Entresto  97-103 mg twice daily, spironolactone  12.5 mg daily , lasix  40 mg once a day if needed may have second dose in the afternoon     HFrEF (heart failure with reduced ejection fraction) (HCC) Assessment: In office BP today 115/79 heart rate 68 last LV EF 40-45 % 01/2024  Symptomatically, he is feeling well, Denies dizziness, lightheadedness, and fatigue. Denies  chest pain or palpitations. Able to complete all ADLs. Activity level active. he does not check his weight at home. Denies LEE, PND, or orthopnea. Appetite has been Normal. he mostly  adheres to a low-salt diet. Tolerates current HF meds well without any side effects   Plan:  Reduce lasix  dose from 40 mg twice daily to 40 mg daily and take 2nd dose prn for swelling   Increase Entresto  dose from 49/51 mg twice daily to 97/103 mg twice daily - will check BMP in 3-4 weeks  Continue taking arvedilol 6.25 mg twice daily, Farxiga 5 mg daily,  spironolactone  12.5 mg daily  Patient to keep record of BP readings with heart rate and report to us  at the next visit Patient to  see PharmD in 4-5 weeks for follow up       Thank you   Robbi Blanch, Pharm.D [place] [facility]. [facility] [address], [place], [place] [address] Phone: [phone]; Fax: [phone]

## 2024-03-18 NOTE — Assessment & Plan Note (Signed)
 Assessment: In office BP today 115/79 heart rate 68 last LV EF 40-45 % 01/2024  Symptomatically, he is feeling well, Denies dizziness, lightheadedness, and fatigue. Denies  chest pain or palpitations. Able to complete all ADLs. Activity level active. he does not check his weight at home. Denies LEE, PND, or orthopnea. Appetite has been Normal. he mostly  adheres to a low-salt diet. Tolerates current HF meds well without any side effects   Plan:  Reduce lasix  dose from 40 mg twice daily to 40 mg daily and take 2nd dose prn for swelling   Increase Entresto  dose from 49/51 mg twice daily to 97/103 mg twice daily - will check BMP in 3-4 weeks  Continue taking arvedilol 6.25 mg twice daily, Farxiga 5 mg daily,  spironolactone  12.5 mg daily  Patient to keep record of BP readings with heart rate and report to us  at the next visit Patient to see PharmD in 4-5 weeks for follow up

## 2024-03-18 NOTE — Patient Instructions (Addendum)
 Changes made by your pharmacist Robbi Blanch, PharmD at today's visit:    Instructions/Changes  (what do you need to do) Your Notes  (what you did and when you did it)  Start taking increased dose Entresto  97/103 twice daily (can use up current supply by taking 2 tabs of 49/51 mg twice daily) continue taking carvedilol  6.25 mg twice daily, Farxiga 5 mg daily, spironolactone  12.5 mg daily   Reduce Furosemide  dose to 40 mg every morning and take 2nd dose later during day if swelling noted    Follow up lab is due Nov 24,2025     Bring all of your meds, your BP cuff and your record of home blood pressures to your next appointment.    HOW TO TAKE YOUR BLOOD PRESSURE AT HOME  Rest 5 minutes before taking your blood pressure.  Don't smoke or drink caffeinated beverages for at least 30 minutes before. Take your blood pressure before (not after) you eat. Sit comfortably with your back supported and both feet on the floor (don't cross your legs). Elevate your arm to heart level on a table or a desk. Use the proper sized cuff. It should fit smoothly and snugly around your bare upper arm. There should be enough room to slip a fingertip under the cuff. The bottom edge of the cuff should be 1 inch above the crease of the elbow. Ideally, take 3 measurements at one sitting and record the average.  Important lifestyle changes to control high blood pressure  Intervention  Effect on the BP  Lose extra pounds and watch your waistline Weight loss is one of the most effective lifestyle changes for controlling blood pressure. If you're overweight or obese, losing even a small amount of weight can help reduce blood pressure. Blood pressure might go down by about 1 millimeter of mercury (mm Hg) with each kilogram (about 2.2 pounds) of weight lost.  Exercise regularly As a general goal, aim for at least 30 minutes of moderate physical activity every day. Regular physical activity can lower high blood pressure  by about 5 to 8 mm Hg.  Eat a healthy diet Eating a diet rich in whole grains, fruits, vegetables, and low-fat dairy products and low in saturated fat and cholesterol. A healthy diet can lower high blood pressure by up to 11 mm Hg.  Reduce salt (sodium) in your diet Even a small reduction of sodium in the diet can improve heart health and reduce high blood pressure by about 5 to 6 mm Hg.  Limit alcohol One drink equals 12 ounces of beer, 5 ounces of wine, or 1.5 ounces of 80-proof liquor.  Limiting alcohol to less than one drink a day for women or two drinks a day for men can help lower blood pressure by about 4 mm Hg.   If you have any questions or concerns please use My Chart to send questions or call the office at [phone]

## 2024-03-18 NOTE — Telephone Encounter (Signed)
 Spoke with patient to discuss upcoming procedure.    Labs: BMP drawn today. Will obtain CBC on 11/5   Any recent signs of acute illness or been started on antibiotics? No Any new medications started?No Any medications to hold?  Yes  HOLD: Dulaglutide  (Trulicity ) for 1 week prior to the procedure. Last dose on OR before Monday, November 10. HOLD: Dapagliflozin Pauletta) for 3 days prior to the procedure. Last dose on Friday, November 14.    Lantus  (Insulin ): The day before your procedure only take  of your usual dinner/bedtime dose. Any missed doses of blood thinner?  No Advised patient to continue taking Xarelto  (Rivaroxaban ) once daily without missing any doses.  Medication instructions:  On the morning of your procedure DO NOT take any medication., including Xarelto  (Rivaroxaban ) or the procedure may be rescheduled. Nothing to eat or drink after midnight prior to your procedure.  Confirmed patient is scheduled for Atrial Fibrillation Ablation on Tuesday, November 18 with Dr. Dr. Nancey. Instructed patient to arrive at the Main Entrance A at [facility]: [address] [place], [place] [address] and check in at Admitting at 11:30 AM.   Advised of plan to go home the same day and will only stay overnight if medically necessary. You MUST have a responsible adult to drive you home and MUST be with you the first 24 hours after you arrive home or your procedure could be cancelled.  Informed patient a nurse will call a day before the procedure to confirm arrival time and ensure instructions are followed.  Patient verbalized understanding to all instructions provided and agreed to proceed with procedure.   Advised patient to contact RN Navigator at [phone], to inform of any new medications started after call or concerns prior to procedure.

## 2024-03-19 ENCOUNTER — Ambulatory Visit: Payer: Self-pay | Admitting: Pharmacist

## 2024-03-19 NOTE — Telephone Encounter (Signed)
 Result discussed over the phone. Holding on Entresto  dose increment plan for now. Will keep him on Entresto  49/51 mg for now given decline renal function. Advised to take furosemide  40 mg once a day and take second dose only if swelling noted. Loop diuretics dose reduction will help with renal function improvement.  Pt verbalize understanding

## 2024-03-25 ENCOUNTER — Encounter: Payer: Self-pay | Admitting: Cardiology

## 2024-03-25 ENCOUNTER — Ambulatory Visit: Attending: Cardiology | Admitting: Cardiology

## 2024-03-25 VITALS — BP 121/72 | HR 50 | Ht 67.0 in | Wt 197.0 lb

## 2024-03-25 DIAGNOSIS — I5020 Unspecified systolic (congestive) heart failure: Secondary | ICD-10-CM | POA: Diagnosis not present

## 2024-03-25 DIAGNOSIS — I480 Paroxysmal atrial fibrillation: Secondary | ICD-10-CM

## 2024-03-25 DIAGNOSIS — I25810 Atherosclerosis of coronary artery bypass graft(s) without angina pectoris: Secondary | ICD-10-CM

## 2024-03-25 LAB — CBC

## 2024-03-25 MED ORDER — DAPAGLIFLOZIN PROPANEDIOL 10 MG PO TABS
10.0000 mg | ORAL_TABLET | Freq: Every day | ORAL | 1 refills | Status: AC
Start: 2024-03-25 — End: ?

## 2024-03-25 NOTE — Progress Notes (Signed)
 Cardiology Office Note:  .   Date:  03/25/2024  ID:  Nathan Medina, DOB [birth year], MRN [id number] PCP: Clarice Nottingham, MD  [facility] Providers Cardiologist:  Newman Lawrence, MD PCP: Clarice Nottingham, MD  Chief Complaint:  Chief Complaint  Patient presents with   HFrEF (heart failure with reduced ejection fraction) (HCC)      History of Present Illness: .   Nathan Medina is a 73 y.o. male with hypertension, CKD stage III, and 2 diabetes mellitus, ischemic cardiomyopathy with recovered EF, coronary artery disease s/p CABGX3 (LIMA-LAD, SVG-dRCA, SVG-ramus) in 2019, paroxysmal A-fib,  stable renal and lung nodules, s/p thyroidectomy, stable thrombocytopenia  Recently, patient was diagnosed with paroxysmal A-fib for which ablation was recommended.  Patient had also experienced bradycardia leg edema, which was more than usual.  His proBNP was elevated at 1600.  Echocardiogram showed slight drop in EF from 50-55% to 40-45%.  Carvedilol  6.25 mg twice daily, Farxiga 5 mg daily, spironolactone  12.5 mg daily were continued.  Plan was to increase Entresto  from 49/51 mg twice daily to 97/103 mg twice daily.  However, given increase in creatinine from 1.49-1.91, Entresto  was continued at 49/50 mg twice daily.  Lasix  40 mg daily, with additional dose as needed for leg edema was added.    After increasing Lasix  to 40 mg twice daily, patient noticed considerable constipation, which has improved since reducing Lasix  to 40 mg once daily.  However, he has noticed some increasing leg swelling and has also noticed 2 pounds weight gain.  His breathing is okay.  On a separate note, he scheduled to undergo A-fib ablation soon with Dr. Nancey.   Vitals:   03/25/24 0955  BP: 121/72  Pulse: (!) 50  SpO2: 98%      ROS:  Review of Systems  Constitutional: Positive for weight gain.  Cardiovascular:  Positive for leg swelling. Negative for chest pain, dyspnea on exertion, palpitations and  syncope.     Studies Reviewed: SABRA    EKG 01/06/2024: Sinus rhythm with frequent Premature ventricular complexes Left axis deviation Low voltage QRS Inferior infarct , age undetermined Cannot rule out Anteroseptal infarct (cited on or before 22-Jun-2020) When compared with ECG of 03-Jan-2024 11:54, No significant change was found  Echocardiogram 01/2024:  1. Left ventricular ejection fraction, by estimation, is 40 to 45%. The  left ventricle has mildly decreased function. The left ventricle  demonstrates regional wall motion abnormalities (see scoring  diagram/findings for description). There is mild left  ventricular hypertrophy. Left ventricular diastolic parameters are  consistent with Grade I diastolic dysfunction (impaired relaxation).   2. Right ventricular systolic function is mildly reduced. The right  ventricular size is normal. Tricuspid regurgitation signal is inadequate  for assessing PA pressure.   3. Left atrial size was mildly dilated.   4. The mitral valve is normal in structure. Trivial mitral valve  regurgitation. No evidence of mitral stenosis.   5. The aortic valve was not well visualized. Aortic valve regurgitation  is trivial. No aortic stenosis is present.   6. Aortic dilatation noted. There is mild dilatation of the ascending  aorta, measuring 39 mm.   7. The inferior vena cava is normal in size with greater than 50%  respiratory variability, suggesting right atrial pressure of 3 mmHg.   CT chest 04/2023: 1. The multiple part solid nodules seen in the right lung on the most recent prior study have resolved consistent with a resolved inflammatory/infectious process. No new or enlarging  pulmonary nodules. 2. Stable chronic pure ground-glass nodule posteriorly in the right upper lobe. This demonstrates slow growth from 2019, although demonstrates no solid components. Continued annual CT follow-up suggested. 3. No evidence of metastatic disease. 4.  Cholelithiasis. 5. 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 6.  Aortic Atherosclerosis (ICD10-I70.0).  Labs 03/18/2024: Hb 16.9 Cr 1.91, eGFR 37 ProBNP 582  02/25/2024: Cr 1.49, eGFR 49  12/2023: ProBNP 1625  Labs 1-12/2023: Chol 94, TG 112, HDL 33, LDL 40 HbA1C 6.8% Hb 17  Physical Exam:   Physical Exam Vitals and nursing note reviewed.  Constitutional:      General: He is not in acute distress. Neck:     Vascular: No JVD.  Cardiovascular:     Rate and Rhythm: Normal rate and regular rhythm.     Heart sounds: Normal heart sounds. No murmur heard. Pulmonary:     Effort: Pulmonary effort is normal.     Breath sounds: Normal breath sounds. No wheezing or rales.  Musculoskeletal:     Right lower leg: Edema (1+) present.     Left lower leg: Edema (1+) present.      VISIT DIAGNOSES:   ICD-10-CM   1. HFrEF (heart failure with reduced ejection fraction) (HCC)  I50.20 Basic Metabolic Panel (BMET)    Pro b natriuretic peptide (BNP)    2. Coronary artery disease involving coronary bypass graft of native heart without angina pectoris  I25.810 CBC    3. Paroxysmal A-fib (HCC)  I48.0         ASSESSMENT AND PLAN: .    Nathan Medina is a 73 y.o. male with hypertension, CKD stage III, and 2 diabetes mellitus, ischemic cardiomyopathy with recovered EF, coronary artery disease s/p CABGX3 (LIMA-LAD, SVG-dRCA, SVG-ramus) in 2019, paroxysmal A-fib,  stable renal and lung nodules, s/p thyroidectomy, stable thrombocytopenia  HFrEF: Recent drop in LVEF to 40-45% (01/2024) with NYHA class II symptoms with leg edema. Paroxysmal A-fib could be contributing, for which she is going to undergo ablation soon. Continue GDMT uptitration as tolerated. Increase Farxiga from 5 mg daily to 10 mg daily. Check BMP and proBNP today. Follow-up with Pharm.D. on 04/14/2024 as scheduled.  Depending on renal function, we can reconsider uptitration of Entresto  from 49-51 mg  twice daily to 97-103 mg twice daily. Continue Coreg  6.25 mg twice daily. Continue Lasix  4 mg daily, did not tolerate twice daily due to constipation. Encouraged continuing fluid intake of about 60 ounces.  Paroxysmal A-fib: Currently in sinus rhythm. Continue Coreg  6.25 mg twice daily. Continue Xarelto  15 mg daily (creatinine clearance <50). Plan for ablation with Dr. Nancey.   CAD: S/p CABG. No angina symptoms. Not on aspirin  given ongoing use of Xarelto .   Hyperlipidemia: Well controlled. TG have also improved. Continue lipitor 10 mg daily.    Thrombocytopenia: Mild, stable. No bleeding. Continue f/u w/PCP.    Management of DM, CKD III, renal and lung nodules as per PCP and other specialists.   F/u in 3 months  Signed, Newman JINNY Lawrence, MD

## 2024-03-25 NOTE — Telephone Encounter (Signed)
 Patient left VM advising that lab work was completed at Dr. Gilda office on today but unsure if it included what's needed for procedure.   Attempted to reach patient. Left VM advising that the labs drawn today are sufficient for his upcoming procedure.

## 2024-03-25 NOTE — Addendum Note (Signed)
 Addended by: Manpreet Kemmer C on: 03/25/2024 10:40 AM   Modules accepted: Orders

## 2024-03-25 NOTE — Patient Instructions (Signed)
 Medication Instructions:  INCREASE: Farxiga to 10 mg once per day  Lab Work: Today: CBC, BMP, and pro BNP  If you have labs (blood work) drawn today and your tests are completely normal, you will receive your results only by: MyChart Message (if you have MyChart) OR A paper copy in the mail If you have any lab test that is abnormal or we need to change your treatment, we will call you to review the results.   Follow-Up: At [facility], you and your health needs are our priority.  As part of our continuing mission to provide you with exceptional heart care, our providers are all part of one team.  This team includes your primary Cardiologist (physician) and Advanced Practice Providers or APPs (Physician Assistants and Nurse Practitioners) who all work together to provide you with the care you need, when you need it.  Your next appointment:   3 month(s)  Provider:   Newman JINNY Lawrence, MD

## 2024-03-25 NOTE — H&P (View-Only) (Signed)
 Cardiology Office Note:  .   Date:  03/25/2024  ID:  Nathan Medina, DOB [birth year], MRN [id number] PCP: Clarice Nottingham, MD  [facility] Providers Cardiologist:  Newman Lawrence, MD PCP: Clarice Nottingham, MD  Chief Complaint:  Chief Complaint  Patient presents with   HFrEF (heart failure with reduced ejection fraction) (HCC)      History of Present Illness: .   Nathan Medina is a 73 y.o. male with hypertension, CKD stage III, and 2 diabetes mellitus, ischemic cardiomyopathy with recovered EF, coronary artery disease s/p CABGX3 (LIMA-LAD, SVG-dRCA, SVG-ramus) in 2019, paroxysmal A-fib,  stable renal and lung nodules, s/p thyroidectomy, stable thrombocytopenia  Recently, patient was diagnosed with paroxysmal A-fib for which ablation was recommended.  Patient had also experienced bradycardia leg edema, which was more than usual.  His proBNP was elevated at 1600.  Echocardiogram showed slight drop in EF from 50-55% to 40-45%.  Carvedilol  6.25 mg twice daily, Farxiga 5 mg daily, spironolactone  12.5 mg daily were continued.  Plan was to increase Entresto  from 49/51 mg twice daily to 97/103 mg twice daily.  However, given increase in creatinine from 1.49-1.91, Entresto  was continued at 49/50 mg twice daily.  Lasix  40 mg daily, with additional dose as needed for leg edema was added.    After increasing Lasix  to 40 mg twice daily, patient noticed considerable constipation, which has improved since reducing Lasix  to 40 mg once daily.  However, he has noticed some increasing leg swelling and has also noticed 2 pounds weight gain.  His breathing is okay.  On a separate note, he scheduled to undergo A-fib ablation soon with Dr. Nancey.   Vitals:   03/25/24 0955  BP: 121/72  Pulse: (!) 50  SpO2: 98%      ROS:  Review of Systems  Constitutional: Positive for weight gain.  Cardiovascular:  Positive for leg swelling. Negative for chest pain, dyspnea on exertion, palpitations and  syncope.     Studies Reviewed: SABRA    EKG 01/06/2024: Sinus rhythm with frequent Premature ventricular complexes Left axis deviation Low voltage QRS Inferior infarct , age undetermined Cannot rule out Anteroseptal infarct (cited on or before 22-Jun-2020) When compared with ECG of 03-Jan-2024 11:54, No significant change was found  Echocardiogram 01/2024:  1. Left ventricular ejection fraction, by estimation, is 40 to 45%. The  left ventricle has mildly decreased function. The left ventricle  demonstrates regional wall motion abnormalities (see scoring  diagram/findings for description). There is mild left  ventricular hypertrophy. Left ventricular diastolic parameters are  consistent with Grade I diastolic dysfunction (impaired relaxation).   2. Right ventricular systolic function is mildly reduced. The right  ventricular size is normal. Tricuspid regurgitation signal is inadequate  for assessing PA pressure.   3. Left atrial size was mildly dilated.   4. The mitral valve is normal in structure. Trivial mitral valve  regurgitation. No evidence of mitral stenosis.   5. The aortic valve was not well visualized. Aortic valve regurgitation  is trivial. No aortic stenosis is present.   6. Aortic dilatation noted. There is mild dilatation of the ascending  aorta, measuring 39 mm.   7. The inferior vena cava is normal in size with greater than 50%  respiratory variability, suggesting right atrial pressure of 3 mmHg.   CT chest 04/2023: 1. The multiple part solid nodules seen in the right lung on the most recent prior study have resolved consistent with a resolved inflammatory/infectious process. No new or enlarging  pulmonary nodules. 2. Stable chronic pure ground-glass nodule posteriorly in the right upper lobe. This demonstrates slow growth from 2019, although demonstrates no solid components. Continued annual CT follow-up suggested. 3. No evidence of metastatic disease. 4.  Cholelithiasis. 5. 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 6.  Aortic Atherosclerosis (ICD10-I70.0).  Labs 03/18/2024: Hb 16.9 Cr 1.91, eGFR 37 ProBNP 582  02/25/2024: Cr 1.49, eGFR 49  12/2023: ProBNP 1625  Labs 1-12/2023: Chol 94, TG 112, HDL 33, LDL 40 HbA1C 6.8% Hb 17  Physical Exam:   Physical Exam Vitals and nursing note reviewed.  Constitutional:      General: He is not in acute distress. Neck:     Vascular: No JVD.  Cardiovascular:     Rate and Rhythm: Normal rate and regular rhythm.     Heart sounds: Normal heart sounds. No murmur heard. Pulmonary:     Effort: Pulmonary effort is normal.     Breath sounds: Normal breath sounds. No wheezing or rales.  Musculoskeletal:     Right lower leg: Edema (1+) present.     Left lower leg: Edema (1+) present.      VISIT DIAGNOSES:   ICD-10-CM   1. HFrEF (heart failure with reduced ejection fraction) (HCC)  I50.20 Basic Metabolic Panel (BMET)    Pro b natriuretic peptide (BNP)    2. Coronary artery disease involving coronary bypass graft of native heart without angina pectoris  I25.810 CBC    3. Paroxysmal A-fib (HCC)  I48.0         ASSESSMENT AND PLAN: .    Nathan Medina is a 73 y.o. male with hypertension, CKD stage III, and 2 diabetes mellitus, ischemic cardiomyopathy with recovered EF, coronary artery disease s/p CABGX3 (LIMA-LAD, SVG-dRCA, SVG-ramus) in 2019, paroxysmal A-fib,  stable renal and lung nodules, s/p thyroidectomy, stable thrombocytopenia  HFrEF: Recent drop in LVEF to 40-45% (01/2024) with NYHA class II symptoms with leg edema. Paroxysmal A-fib could be contributing, for which she is going to undergo ablation soon. Continue GDMT uptitration as tolerated. Increase Farxiga from 5 mg daily to 10 mg daily. Check BMP and proBNP today. Follow-up with Pharm.D. on 04/14/2024 as scheduled.  Depending on renal function, we can reconsider uptitration of Entresto  from 49-51 mg  twice daily to 97-103 mg twice daily. Continue Coreg  6.25 mg twice daily. Continue Lasix  4 mg daily, did not tolerate twice daily due to constipation. Encouraged continuing fluid intake of about 60 ounces.  Paroxysmal A-fib: Currently in sinus rhythm. Continue Coreg  6.25 mg twice daily. Continue Xarelto  15 mg daily (creatinine clearance <50). Plan for ablation with Dr. Nancey.   CAD: S/p CABG. No angina symptoms. Not on aspirin  given ongoing use of Xarelto .   Hyperlipidemia: Well controlled. TG have also improved. Continue lipitor 10 mg daily.    Thrombocytopenia: Mild, stable. No bleeding. Continue f/u w/PCP.    Management of DM, CKD III, renal and lung nodules as per PCP and other specialists.   F/u in 3 months  Signed, Newman JINNY Lawrence, MD

## 2024-03-26 ENCOUNTER — Ambulatory Visit: Payer: Self-pay | Admitting: Cardiology

## 2024-03-26 LAB — CBC
Hematocrit: 50.9 % (ref 37.5–51.0)
Hemoglobin: 17.2 g/dL (ref 13.0–17.7)
MCH: 32.1 pg (ref 26.6–33.0)
MCHC: 33.8 g/dL (ref 31.5–35.7)
MCV: 95 fL (ref 79–97)
Platelets: 105 x10E3/uL — AB (ref 150–450)
RBC: 5.35 x10E6/uL (ref 4.14–5.80)
RDW: 13.2 % (ref 11.6–15.4)
WBC: 5.5 x10E3/uL (ref 3.4–10.8)

## 2024-03-26 LAB — BASIC METABOLIC PANEL WITH GFR
BUN/Creatinine Ratio: 22 (ref 10–24)
BUN: 35 mg/dL — ABNORMAL HIGH (ref 8–27)
CO2: 23 mmol/L (ref 20–29)
Calcium: 10.1 mg/dL (ref 8.6–10.2)
Chloride: 106 mmol/L (ref 96–106)
Creatinine, Ser: 1.6 mg/dL — ABNORMAL HIGH (ref 0.76–1.27)
Glucose: 154 mg/dL — ABNORMAL HIGH (ref 70–99)
Potassium: 4.5 mmol/L (ref 3.5–5.2)
Sodium: 143 mmol/L (ref 134–144)
eGFR: 45 mL/min/1.73 — ABNORMAL LOW (ref >59)

## 2024-03-26 LAB — PRO B NATRIURETIC PEPTIDE: NT-Pro BNP: 863 pg/mL — AB (ref 0–376)

## 2024-03-27 DIAGNOSIS — R972 Elevated prostate specific antigen [PSA]: Secondary | ICD-10-CM | POA: Diagnosis not present

## 2024-04-01 ENCOUNTER — Ambulatory Visit (HOSPITAL_COMMUNITY): Admitting: Physician Assistant

## 2024-04-02 ENCOUNTER — Other Ambulatory Visit: Payer: Self-pay | Admitting: Thoracic Surgery (Cardiothoracic Vascular Surgery)

## 2024-04-02 DIAGNOSIS — R911 Solitary pulmonary nodule: Secondary | ICD-10-CM

## 2024-04-02 DIAGNOSIS — H43813 Vitreous degeneration, bilateral: Secondary | ICD-10-CM | POA: Diagnosis not present

## 2024-04-02 DIAGNOSIS — H353132 Nonexudative age-related macular degeneration, bilateral, intermediate dry stage: Secondary | ICD-10-CM | POA: Diagnosis not present

## 2024-04-02 DIAGNOSIS — E119 Type 2 diabetes mellitus without complications: Secondary | ICD-10-CM | POA: Diagnosis not present

## 2024-04-02 DIAGNOSIS — H33311 Horseshoe tear of retina without detachment, right eye: Secondary | ICD-10-CM | POA: Diagnosis not present

## 2024-04-02 DIAGNOSIS — Z961 Presence of intraocular lens: Secondary | ICD-10-CM | POA: Diagnosis not present

## 2024-04-03 DIAGNOSIS — R350 Frequency of micturition: Secondary | ICD-10-CM | POA: Diagnosis not present

## 2024-04-03 DIAGNOSIS — R399 Unspecified symptoms and signs involving the genitourinary system: Secondary | ICD-10-CM | POA: Diagnosis not present

## 2024-04-03 DIAGNOSIS — N401 Enlarged prostate with lower urinary tract symptoms: Secondary | ICD-10-CM | POA: Diagnosis not present

## 2024-04-03 DIAGNOSIS — R972 Elevated prostate specific antigen [PSA]: Secondary | ICD-10-CM | POA: Diagnosis not present

## 2024-04-03 DIAGNOSIS — N5203 Combined arterial insufficiency and corporo-venous occlusive erectile dysfunction: Secondary | ICD-10-CM | POA: Diagnosis not present

## 2024-04-07 ENCOUNTER — Ambulatory Visit (HOSPITAL_COMMUNITY): Admitting: Anesthesiology

## 2024-04-07 ENCOUNTER — Other Ambulatory Visit: Payer: Self-pay

## 2024-04-07 ENCOUNTER — Ambulatory Visit (HOSPITAL_COMMUNITY)
Admission: RE | Disposition: A | Discharge status: Home / Self Care | Payer: Self-pay | Source: Home / Self Care | Attending: Cardiovascular Disease

## 2024-04-07 ENCOUNTER — Ambulatory Visit (HOSPITAL_COMMUNITY)
Admission: RE | Admit: 2024-04-07 | Discharge: 2024-04-07 | Disposition: A | Discharge status: Home / Self Care | Attending: Cardiovascular Disease | Admitting: Cardiovascular Disease

## 2024-04-07 DIAGNOSIS — I110 Hypertensive heart disease with heart failure: Secondary | ICD-10-CM | POA: Diagnosis not present

## 2024-04-07 DIAGNOSIS — E785 Hyperlipidemia, unspecified: Secondary | ICD-10-CM | POA: Insufficient documentation

## 2024-04-07 DIAGNOSIS — E1151 Type 2 diabetes mellitus with diabetic peripheral angiopathy without gangrene: Secondary | ICD-10-CM | POA: Insufficient documentation

## 2024-04-07 DIAGNOSIS — I5022 Chronic systolic (congestive) heart failure: Secondary | ICD-10-CM | POA: Diagnosis not present

## 2024-04-07 DIAGNOSIS — I480 Paroxysmal atrial fibrillation: Secondary | ICD-10-CM

## 2024-04-07 DIAGNOSIS — K219 Gastro-esophageal reflux disease without esophagitis: Secondary | ICD-10-CM | POA: Diagnosis not present

## 2024-04-07 DIAGNOSIS — Z7901 Long term (current) use of anticoagulants: Secondary | ICD-10-CM | POA: Insufficient documentation

## 2024-04-07 DIAGNOSIS — I130 Hypertensive heart and chronic kidney disease with heart failure and stage 1 through stage 4 chronic kidney disease, or unspecified chronic kidney disease: Secondary | ICD-10-CM | POA: Insufficient documentation

## 2024-04-07 DIAGNOSIS — I5020 Unspecified systolic (congestive) heart failure: Secondary | ICD-10-CM | POA: Insufficient documentation

## 2024-04-07 DIAGNOSIS — Z794 Long term (current) use of insulin: Secondary | ICD-10-CM | POA: Diagnosis not present

## 2024-04-07 DIAGNOSIS — E1122 Type 2 diabetes mellitus with diabetic chronic kidney disease: Secondary | ICD-10-CM | POA: Diagnosis not present

## 2024-04-07 DIAGNOSIS — Z951 Presence of aortocoronary bypass graft: Secondary | ICD-10-CM | POA: Diagnosis not present

## 2024-04-07 DIAGNOSIS — D696 Thrombocytopenia, unspecified: Secondary | ICD-10-CM | POA: Insufficient documentation

## 2024-04-07 DIAGNOSIS — Z79899 Other long term (current) drug therapy: Secondary | ICD-10-CM | POA: Insufficient documentation

## 2024-04-07 DIAGNOSIS — I2510 Atherosclerotic heart disease of native coronary artery without angina pectoris: Secondary | ICD-10-CM | POA: Diagnosis not present

## 2024-04-07 DIAGNOSIS — I252 Old myocardial infarction: Secondary | ICD-10-CM | POA: Diagnosis not present

## 2024-04-07 DIAGNOSIS — Z7984 Long term (current) use of oral hypoglycemic drugs: Secondary | ICD-10-CM | POA: Diagnosis not present

## 2024-04-07 DIAGNOSIS — N183 Chronic kidney disease, stage 3 unspecified: Secondary | ICD-10-CM | POA: Insufficient documentation

## 2024-04-07 HISTORY — PX: ATRIAL FIBRILLATION ABLATION: EP1191

## 2024-04-07 LAB — POCT ACTIVATED CLOTTING TIME: Activated Clotting Time: 504 s

## 2024-04-07 LAB — GLUCOSE, CAPILLARY
Glucose-Capillary: 156 mg/dL — ABNORMAL HIGH (ref 70–99)
Glucose-Capillary: 108 mg/dL — ABNORMAL HIGH (ref 70–99)

## 2024-04-07 MED ORDER — PROPOFOL 10 MG/ML IV BOLUS
INTRAVENOUS | Status: DC | PRN
Start: 2024-04-07 — End: 2024-04-07
  Administered 2024-04-07: 150 mg via INTRAVENOUS
  Administered 2024-04-07: 30 mg via INTRAVENOUS

## 2024-04-07 MED ORDER — ONDANSETRON HCL 4 MG/2ML IJ SOLN
INTRAMUSCULAR | Status: DC | PRN
  Administered 2024-04-07: 4 mg via INTRAVENOUS

## 2024-04-07 MED ORDER — PHENYLEPHRINE 80 MCG/ML (10ML) SYRINGE FOR IV PUSH (FOR BLOOD PRESSURE SUPPORT)
PREFILLED_SYRINGE | INTRAVENOUS | Status: DC | PRN
  Administered 2024-04-07: 80 ug via INTRAVENOUS
  Administered 2024-04-07: 160 ug via INTRAVENOUS
  Administered 2024-04-07: 80 ug via INTRAVENOUS

## 2024-04-07 MED ORDER — SODIUM CHLORIDE 0.9 % IV SOLN: INTRAVENOUS | Status: DC

## 2024-04-07 MED ORDER — HEPARIN SODIUM (PORCINE) 1000 UNIT/ML IJ SOLN
INTRAMUSCULAR | Status: DC | PRN
  Administered 2024-04-07: 17000 [IU] via INTRAVENOUS

## 2024-04-07 MED ORDER — HEPARIN (PORCINE) IN NACL 1000-0.9 UT/500ML-% IV SOLN
INTRAVENOUS | Status: DC | PRN
  Administered 2024-04-07 (×3): 500 mL

## 2024-04-07 MED ORDER — ACETAMINOPHEN 325 MG PO TABS: 650.0000 mg | ORAL_TABLET | ORAL | Status: DC | PRN

## 2024-04-07 MED ORDER — FENTANYL CITRATE (PF) 100 MCG/2ML IJ SOLN
INTRAMUSCULAR | Status: DC | PRN
  Administered 2024-04-07 (×2): 50 ug via INTRAVENOUS

## 2024-04-07 MED ORDER — EPHEDRINE SULFATE-NACL 50-0.9 MG/10ML-% IV SOSY
PREFILLED_SYRINGE | INTRAVENOUS | Status: DC | PRN
  Administered 2024-04-07: 10 mg via INTRAVENOUS
  Administered 2024-04-07: 15 mg via INTRAVENOUS

## 2024-04-07 MED ORDER — FENTANYL CITRATE (PF) 100 MCG/2ML IJ SOLN
INTRAMUSCULAR | Status: AC
  Filled 2024-04-07: qty 2

## 2024-04-07 MED ORDER — ATROPINE SULFATE 1 MG/10ML IJ SOSY
PREFILLED_SYRINGE | INTRAMUSCULAR | Status: AC
Start: 2024-04-07 — End: 2024-04-07
  Filled 2024-04-07: qty 10

## 2024-04-07 MED ORDER — ROCURONIUM BROMIDE 10 MG/ML (PF) SYRINGE
PREFILLED_SYRINGE | INTRAVENOUS | Status: DC | PRN
  Administered 2024-04-07: 60 mg via INTRAVENOUS

## 2024-04-07 MED ORDER — LIDOCAINE 2% (20 MG/ML) 5 ML SYRINGE
INTRAMUSCULAR | Status: DC | PRN
  Administered 2024-04-07: 80 mg via INTRAVENOUS

## 2024-04-07 MED ORDER — SUGAMMADEX SODIUM 200 MG/2ML IV SOLN
INTRAVENOUS | Status: DC | PRN
  Administered 2024-04-07: 100 mg via INTRAVENOUS
  Administered 2024-04-07: 200 mg via INTRAVENOUS

## 2024-04-07 MED ORDER — PROTAMINE SULFATE 10 MG/ML IV SOLN
INTRAVENOUS | Status: DC | PRN
  Administered 2024-04-07: 50 mg via INTRAVENOUS

## 2024-04-07 MED ORDER — ONDANSETRON HCL 4 MG/2ML IJ SOLN: 4.0000 mg | Freq: Four times a day (QID) | INTRAMUSCULAR | Status: DC | PRN

## 2024-04-07 MED ORDER — ATROPINE SULFATE 1 MG/ML IV SOLN
INTRAVENOUS | Status: DC | PRN
  Administered 2024-04-07: 1 mg via INTRAVENOUS

## 2024-04-07 MED ORDER — DEXAMETHASONE SOD PHOSPHATE PF 10 MG/ML IJ SOLN
INTRAMUSCULAR | Status: DC | PRN
  Administered 2024-04-07: 5 mg via INTRAVENOUS

## 2024-04-07 MED ORDER — PHENYLEPHRINE HCL-NACL 20-0.9 MG/250ML-% IV SOLN
INTRAVENOUS | Status: DC | PRN
  Administered 2024-04-07: 30 ug/min via INTRAVENOUS

## 2024-04-07 MED ORDER — MIDAZOLAM HCL 2 MG/2ML IJ SOLN
INTRAMUSCULAR | Status: AC
  Filled 2024-04-07: qty 2

## 2024-04-07 NOTE — Anesthesia Procedure Notes (Signed)
 Procedure Name: Intubation Date/Time: 04/07/2024 1:46 PM  Performed by: Genny Gun, CRNAPre-anesthesia Checklist: Patient identified, Emergency Drugs available, Suction available, Patient being monitored and Timeout performed Patient Re-evaluated:Patient Re-evaluated prior to induction Oxygen Delivery Method: Circle system utilized Preoxygenation: Pre-oxygenation with 100% oxygen Induction Type: IV induction Ventilation: Mask ventilation without difficulty Laryngoscope Size: Mac and 3 Grade View: Grade I Tube type: Oral Tube size: 7.0 mm Number of attempts: 1 Placement Confirmation: ETT inserted through vocal cords under direct vision, positive ETCO2 and breath sounds checked- equal and bilateral Secured at: 23 cm Tube secured with: Tape Dental Injury: Teeth and Oropharynx as per pre-operative assessment

## 2024-04-07 NOTE — Anesthesia Postprocedure Evaluation (Signed)
 Anesthesia Post Note  Patient: Nathan Medina  Procedure(s) Performed: ATRIAL FIBRILLATION ABLATION     Patient location during evaluation: PACU Anesthesia Type: General Level of consciousness: awake and alert Pain management: pain level controlled Vital Signs Assessment: post-procedure vital signs reviewed and stable Respiratory status: spontaneous breathing, nonlabored ventilation, respiratory function stable and patient connected to nasal cannula oxygen Cardiovascular status: blood pressure returned to baseline and stable Postop Assessment: no apparent nausea or vomiting Anesthetic complications: no   No notable events documented.  Last Vitals:  Vitals:   04/07/24 1700 04/07/24 1730  BP: 101/68 105/66  Pulse: 62 (!) 57  Resp: 16 14  Temp:    SpO2: 95% 95%    Last Pain:  Vitals:   04/07/24 1730  TempSrc:   PainSc: 0-No pain                 Garnette DELENA Gab

## 2024-04-07 NOTE — Interval H&P Note (Signed)
 History and Physical Interval Note:  04/07/2024 1:09 PM  Nathan Medina  has presented today for surgery, with the diagnosis of afib.  The various methods of treatment have been discussed with the patient and family. After consideration of risks, benefits and other options for treatment, the patient has consented to  Procedure(s): ATRIAL FIBRILLATION ABLATION (N/A) as a surgical intervention.  The patient's history has been reviewed, patient examined, no change in status, stable for surgery.  I have reviewed the patient's chart and labs.  Questions were answered to the patient's satisfaction.    I reviewed the patient's chart and labs. he  has not missed any doses of anticoagulation, and he took his dose last night. There have been no changes in the patient's diagnoses, medications, or condition since our recent clinic visit.     Esperanza Madrazo E Dason Mosley

## 2024-04-07 NOTE — Discharge Instructions (Signed)

## 2024-04-07 NOTE — Progress Notes (Signed)
 Discharge instructions reviewed with patient and friend at bedside. Denies questions concerns. PT tolerated PO intake. Ambulated in the hallway, was able to void without difficulty. Incision site remains clean dry and intact. No s/s of complications. PT escorted from the unit via wheel chair to personal vehicle.

## 2024-04-07 NOTE — Transfer of Care (Signed)
 Immediate Anesthesia Transfer of Care Note  Patient: Nathan Medina  Procedure(s) Performed: ATRIAL FIBRILLATION ABLATION  Patient Location: PACU  Anesthesia Type:General  Level of Consciousness: drowsy and patient cooperative  Airway & Oxygen Therapy: Patient Spontanous Breathing and Patient connected to face mask oxygen  Post-op Assessment: Report given to RN and Post -op Vital signs reviewed and stable  Post vital signs: Reviewed and stable  Last Vitals:  Vitals Value Taken Time  BP 99/71 04/07/24 15:11  Temp    Pulse 76 04/07/24 15:11  Resp 20 04/07/24 15:11  SpO2 97 % 04/07/24 15:11    Last Pain:  Vitals:   04/07/24 1206  TempSrc: Oral  PainSc: 0-No pain         Complications: No notable events documented.

## 2024-04-07 NOTE — Anesthesia Preprocedure Evaluation (Addendum)
 Anesthesia Evaluation  Patient identified by MRN, date of birth, ID band Patient awake    Reviewed: Allergy & Precautions, NPO status , Patient's Chart, lab work & pertinent test results, reviewed documented beta blocker date and time   History of Anesthesia Complications Negative for: history of anesthetic complications  Airway Mallampati: II  TM Distance: >3 FB Neck ROM: Full    Dental no notable dental hx. (+) Teeth Intact, Dental Advisory Given   Pulmonary neg pulmonary ROS   Pulmonary exam normal breath sounds clear to auscultation       Cardiovascular hypertension, Pt. on medications and Pt. on home beta blockers (-) angina + CAD, + Past MI, + CABG, + Peripheral Vascular Disease and +CHF  Normal cardiovascular exam Rhythm:Regular Rate:Normal  Echo 02/11/24: 1. Left ventricular ejection fraction, by estimation, is 40 to 45%. The  left ventricle has mildly decreased function. The left ventricle  demonstrates regional wall motion abnormalities (see scoring  diagram/findings for description). There is mild left  ventricular hypertrophy. Left ventricular diastolic parameters are  consistent with Grade I diastolic dysfunction (impaired relaxation).   2. Right ventricular systolic function is mildly reduced. The right  ventricular size is normal. Tricuspid regurgitation signal is inadequate  for assessing PA pressure.   3. Left atrial size was mildly dilated.   4. The mitral valve is normal in structure. Trivial mitral valve  regurgitation. No evidence of mitral stenosis.   5. The aortic valve was not well visualized. Aortic valve regurgitation  is trivial. No aortic stenosis is present.   6. Aortic dilatation noted. There is mild dilatation of the ascending  aorta, measuring 39 mm.   7. The inferior vena cava is normal in size with greater than 50%  respiratory variability, suggesting right atrial pressure of 3 mmHg.      Neuro/Psych  PSYCHIATRIC DISORDERS Anxiety Depression    negative neurological ROS     GI/Hepatic Neg liver ROS,GERD  Medicated and Controlled,,  Endo/Other  diabetes, Type 2, Insulin  Dependent, Oral Hypoglycemic AgentsHypothyroidism  Obesity   Renal/GU Renal InsufficiencyRenal disease     Musculoskeletal  (+) Arthritis ,    Abdominal   Peds  Hematology  (+) Blood dyscrasia (Xarelto ) Lab Results      Component                Value               Date                      WBC                      5.5                 03/25/2024                HGB                      17.2                03/25/2024                HCT                      50.9                03/25/2024  MCV                      95                  03/25/2024                PLT                      105 (L)             03/25/2024              Anesthesia Other Findings AllCipro Quinapril  Reproductive/Obstetrics                              Anesthesia Physical Anesthesia Plan  ASA: 3  Anesthesia Plan: General   Post-op Pain Management:    Induction: Intravenous  PONV Risk Score and Plan: 2 and Dexamethasone  and Ondansetron   Airway Management Planned: Oral ETT  Additional Equipment:   Intra-op Plan:   Post-operative Plan: Extubation in OR  Informed Consent: I have reviewed the patients History and Physical, chart, labs and discussed the procedure including the risks, benefits and alternatives for the proposed anesthesia with the patient or authorized representative who has indicated his/her understanding and acceptance.     Dental advisory given  Plan Discussed with: CRNA  Anesthesia Plan Comments:          Anesthesia Quick Evaluation

## 2024-04-08 ENCOUNTER — Encounter (HOSPITAL_COMMUNITY): Payer: Self-pay | Admitting: Cardiovascular Disease

## 2024-04-08 ENCOUNTER — Telehealth (HOSPITAL_COMMUNITY): Payer: Self-pay

## 2024-04-08 MED FILL — Atropine Sulfate Soln Prefill Syr 1 MG/10ML (0.1 MG/ML): INTRAMUSCULAR | Qty: 10 | Status: AC

## 2024-04-08 NOTE — Telephone Encounter (Signed)
 Spoke with patient to complete post procedure follow up call.  Patient reports no complications with groin sites.   Instructions reviewed with patient:  Remove large bandage at puncture site after 24 hours. It is normal to have bruising, tenderness, mild swelling, and a pea or marble sized lump/knot at the groin site which can take up to three months to resolve.  Get help right away if you notice sudden swelling at the puncture site.  Check your puncture site every day for signs of infection: fever, redness, swelling, pus drainage, warmth, foul odor or excessive pain. If this occurs, please call [phone], to speak with the RN Navigator. Get help right away if your puncture site is bleeding and the bleeding does not stop after applying firm pressure to the area.  You may continue to have skipped beats/ atrial fibrillation during the first several months after your procedure.  It is very important not to miss any doses of your blood thinner Xarelto .    You will follow up with the Afib clinic 4 weeks after your procedure and follow up with Dr. Nancey 3 months after your procedure.  Activity restrictions reviewed.  Patient verbalized understanding to all instructions provided.

## 2024-04-13 ENCOUNTER — Other Ambulatory Visit: Payer: Self-pay | Admitting: Pharmacist

## 2024-04-13 DIAGNOSIS — I5022 Chronic systolic (congestive) heart failure: Secondary | ICD-10-CM | POA: Diagnosis not present

## 2024-04-13 LAB — BASIC METABOLIC PANEL WITH GFR
BUN/Creatinine Ratio: 28 — ABNORMAL HIGH (ref 10–24)
BUN: 42 mg/dL — ABNORMAL HIGH (ref 8–27)
CO2: 22 mmol/L (ref 20–29)
Calcium: 9.2 mg/dL (ref 8.6–10.2)
Chloride: 107 mmol/L — ABNORMAL HIGH (ref 96–106)
Creatinine, Ser: 1.49 mg/dL — ABNORMAL HIGH (ref 0.76–1.27)
Glucose: 244 mg/dL — ABNORMAL HIGH (ref 70–99)
Potassium: 5 mmol/L (ref 3.5–5.2)
Sodium: 141 mmol/L (ref 134–144)
eGFR: 49 mL/min/1.73 — ABNORMAL LOW (ref >59)

## 2024-04-14 ENCOUNTER — Encounter: Payer: Self-pay | Admitting: Pharmacist

## 2024-04-14 ENCOUNTER — Ambulatory Visit: Admitting: Pharmacist

## 2024-04-14 ENCOUNTER — Other Ambulatory Visit (HOSPITAL_COMMUNITY): Payer: Self-pay

## 2024-04-14 VITALS — BP 143/77 | HR 55

## 2024-04-14 DIAGNOSIS — I5020 Unspecified systolic (congestive) heart failure: Secondary | ICD-10-CM

## 2024-04-14 MED ORDER — OMRON 3 SERIES BP MONITOR DEVI
1.0000 | Freq: Every day | 0 refills | Status: AC
  Filled 2024-04-14: qty 1, 30d supply, fill #0

## 2024-04-14 MED ORDER — FUROSEMIDE 40 MG PO TABS: 40.0000 mg | ORAL_TABLET | ORAL | Status: AC

## 2024-04-14 NOTE — Assessment & Plan Note (Signed)
 Assessment: In office BP today 134/82 heart rate 54 second reading 143/77 heart rate 55  last LV EF 40-45 % 01/2024  Symptomatically, he is feeling well, Denies dizziness, lightheadedness, and fatigue. Denies  chest pain or palpitations. Able to complete all ADLs. Activity level active. he does not check his weight at home. Denies LEE, PND, or orthopnea. Appetite has been Normal. he mostly  adheres to a low-salt diet. Tolerates current HF meds well without any side effects   Plan:  Reduce lasix  dose from 40 mg daily to 40 mg every other day   Increase Entresto  dose from 49/51 mg twice daily to 97/103 mg twice daily - will check BMP in 3-4 weeks  Continue taking arvedilol 6.25 mg twice daily, Farxiga  10 mg daily,  spironolactone  12.5 mg daily  Patient to keep record of BP readings with heart rate and report to us  at the next visit Patient to see PharmD in 6-8 weeks for follow up

## 2024-04-14 NOTE — Patient Instructions (Signed)
 Changes made by your pharmacist Robbi Blanch, PharmD at today's visit:    Instructions/Changes  (what do you need to do) Your Notes  (what you did and when you did it)  Reduce furosemide  dose from 40 mg daily to 40 mg every other day    Increase Entresto  dose from 49/51 mg to 97/103 mg twice daily get the BMP lab checked on Dec 12   Continue taking carvedilol  6.25 mg twice daily spironolactone  12.5 mg daily and Farxiga  10 mg daily     Bring all of your meds, your BP cuff and your record of home blood pressures to your next appointment.    HOW TO TAKE YOUR BLOOD PRESSURE AT HOME  Rest 5 minutes before taking your blood pressure.  Don't smoke or drink caffeinated beverages for at least 30 minutes before. Take your blood pressure before (not after) you eat. Sit comfortably with your back supported and both feet on the floor (don't cross your legs). Elevate your arm to heart level on a table or a desk. Use the proper sized cuff. It should fit smoothly and snugly around your bare upper arm. There should be enough room to slip a fingertip under the cuff. The bottom edge of the cuff should be 1 inch above the crease of the elbow. Ideally, take 3 measurements at one sitting and record the average.  Important lifestyle changes to control high blood pressure  Intervention  Effect on the BP  Lose extra pounds and watch your waistline Weight loss is one of the most effective lifestyle changes for controlling blood pressure. If you're overweight or obese, losing even a small amount of weight can help reduce blood pressure. Blood pressure might go down by about 1 millimeter of mercury (mm Hg) with each kilogram (about 2.2 pounds) of weight lost.  Exercise regularly As a general goal, aim for at least 30 minutes of moderate physical activity every day. Regular physical activity can lower high blood pressure by about 5 to 8 mm Hg.  Eat a healthy diet Eating a diet rich in whole grains, fruits,  vegetables, and low-fat dairy products and low in saturated fat and cholesterol. A healthy diet can lower high blood pressure by up to 11 mm Hg.  Reduce salt (sodium) in your diet Even a small reduction of sodium in the diet can improve heart health and reduce high blood pressure by about 5 to 6 mm Hg.  Limit alcohol One drink equals 12 ounces of beer, 5 ounces of wine, or 1.5 ounces of 80-proof liquor.  Limiting alcohol to less than one drink a day for women or two drinks a day for men can help lower blood pressure by about 4 mm Hg.   If you have any questions or concerns please use My Chart to send questions or call the office at [phone]

## 2024-04-14 NOTE — Progress Notes (Signed)
 Patient ID: Nathan Medina                 DOB: 1950/08/25                      MRN: [id number]     HPI: Nathan Medina is a 73 y.o. male referred by Dr. Elmira  to pharmacy clinic for HF medication management. PMH is significant for  hypertension, CKD stage III, and 2 diabetes mellitus, ischemic cardiomyopathy with recovered EF, coronary artery disease s/p CABGX3 (LIMA-LAD, SVG-dRCA, SVG-ramus) in 2019, paroxysmal A-fib,  stable renal and lung nodules, s/p thyroidectomy, stable thrombocytopenia  . Most recent LVEF 40-45 % on 01/2024 .  Hx of improved EF but recent EF dropped so 01/2024 spironolactone  12.5 mg was added to other HF medications . K level and renal function was WNL post addition and NT pro BNP went down to 582 from 1625 (2 months ago).   Currently pt is on  carvedilol  6.25 mg twice daily, Farxiga  10 mg daily, Entresto  49-51 mg twice daily, spironolactone  12.5 mg daily. CrCl 55 mL/min (SrCr 1.49)  which went up to 1.91 so we held the plan on increasing Entresto  dose and loop diuretics dose was reduced from 40 mg twice daily to once daily  The most recent lab from yesterday SrCr returning to baseline. Patient does not check BP regularly but noted from past few days his home BP ~ 136-132/82-78 range. He feels fine he denies any swelling, dizziness, SOB at rest. No PND or orthopnea. He has been taking and tolerating increased dose Farxiga  and taking furosemide  40 mg once a day   Wants to get new BP monitor so prescription sent today   Current CHF meds: carvedilol  6.25 mg twice daily, Farxiga  10 mg daily, Entresto  49-51 mg twice daily, spironolactone  12.5 mg daily , lasix  40 mg once a day and take 2nd dose if swelling noted   Adherence Assessment  Do you ever forget to take your medication? [] Yes [x] No  Do you ever skip doses due to side effects? [] Yes [x] No  Do you have trouble affording your medicines? [] Yes [x] No  Are you ever unable to pick up your medication due to  transportation difficulties? [] Yes [x] No  Do you ever stop taking your medications because you don't believe they are helping? [] Yes [x] No  Do you check your weight daily?  [] Yes [x] No   Adherence strategy: pill box   Barriers to obtaining medications: none   BP goal: <130/80   Family History:  Relation Problem Comments  Mother (Deceased at age 15)   Father (Deceased at age 55) Died from MI at age 59   Sister (Alive)   Paternal Aunt Hypertension     Maternal Grandmother Hypertension   Stroke in her 41's       Social History:  Alcohol: rarely  Smoking: none  Diet: follows low carb and low fat diet   Exercise: Gym once a week- balance and strength building Walks -  laps around the house   Home BP readings: 120/80   Wt Readings from Last 3 Encounters:  04/07/24 194 lb (88 kg)  03/25/24 197 lb (89.4 kg)  03/04/24 194 lb (88 kg)   BP Readings from Last 3 Encounters:  04/14/24 (!) 143/77  04/07/24 95/69  03/25/24 121/72   Pulse Readings from Last 3 Encounters:  04/14/24 (!) 55  04/07/24 63  03/25/24 (!) 50    Renal function: Estimated Creatinine Clearance: 46.8  mL/min (A) (by C-G formula based on SCr of 1.49 mg/dL (H)).  Past Medical History:  Diagnosis Date   Abnormal liver function test 05/23/2011   ALLERGIC RHINITIS    ANXIETY    Aortic atherosclerosis    Arthritis    Ascending aorta dilation 07/24/2018   Bilateral renal cysts    Cervical spondylolysis    Moderate   CHF (congestive heart failure) (HCC) 07/24/2018   Chronic kidney disease    CKD stage 3 per office visit note 08/08/17 on chart    COLONIC POLYPS, HX OF    Coronary artery disease    DEPRESSION    DIABETES MELLITUS, TYPE II    Diverticulitis    Epidermal cyst    Fatty liver    GERD    GOUT    History of inguinal hernia    HOH (hard of hearing)    elft ear   HYPERLIPIDEMIA    HYPERTENSION    IBS    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,  takedown and reversal September 2012   Ischemic cardiomyopathy 07/24/2018   Left renal mass 05/23/2011   Lumbar radicular pain 06/03/2011   Macular degeneration    right eye   Multinodular goiter    Myocardial infarction [facility])    silent MI in Pt's 30s   Pancreatic pseudocyst    Stable   Peripheral vascular disease    Peritonsillar abscess    Pulmonary nodule    Right upper lobe   Radial neck fracture    Right upper lobe pulmonary nodule 07/24/2018   Thyroid  disease    Thyroid  nodule    Bilateral   Vertigo     Current Outpatient Medications on File Prior to Visit  Medication Sig Dispense Refill   acetaminophen  (TYLENOL ) 500 MG tablet Take 500-1,000 mg by mouth every 8 (eight) hours as needed for mild pain (pain score 1-3) or moderate pain (pain score 4-6).     alendronate (FOSAMAX) 70 MG tablet Take 70 mg by mouth once a week.     allopurinol  (ZYLOPRIM ) 300 MG tablet Take 300 mg by mouth daily.     aspirin -acetaminophen -caffeine (EXCEDRIN EXTRA STRENGTH) 250-250-65 MG tablet Take 2 tablets by mouth every 8 (eight) hours as needed for headache or migraine.     atorvastatin  (LIPITOR) 10 MG tablet Take 10 mg by mouth at bedtime.     carvedilol  (COREG ) 6.25 MG tablet Take 1 tablet (6.25 mg total) by mouth 2 (two) times daily. 180 tablet 3   Cholecalciferol (VITAMIN D-3) 25 MCG (1000 UT) CAPS Take 1,000 Units by mouth at bedtime.     cyanocobalamin  (VITAMIN B12) 1000 MCG tablet Take 1,000 mcg by mouth at bedtime.     dapagliflozin  propanediol (FARXIGA ) 10 MG TABS tablet Take 1 tablet (10 mg total) by mouth daily. 90 tablet 1   diphenhydrAMINE  (BENADRYL ) 25 MG tablet Take 25 mg by mouth every 6 (six) hours as needed for allergies.     Dulaglutide  1.5 MG/0.5ML SOAJ Inject 1.5 mg into the skin once a week. Monday     famotidine  (PEPCID ) 40 MG tablet Take 40 mg by mouth at bedtime as needed for heartburn or indigestion.     fluticasone (FLONASE) 50 MCG/ACT nasal spray Place 1 spray into both  nostrils daily as needed for allergies or rhinitis.     gabapentin (NEURONTIN) 100 MG capsule Take 100 mg by mouth at bedtime.     guaiFENesin  (MUCINEX ) 600 MG 12  hr tablet Take 1,200 mg by mouth 2 (two) times daily as needed for cough or to loosen phlegm.     ibuprofen (ADVIL) 200 MG tablet Take 300 mg by mouth every 6 (six) hours as needed for mild pain (pain score 1-3), headache or moderate pain (pain score 4-6).     insulin  glargine (LANTUS ) 100 UNIT/ML Solostar Pen Inject 32 Units into the skin at bedtime.     levothyroxine  (SYNTHROID ) 175 MCG tablet Take 175 mcg by mouth at bedtime.     loratadine (CLARITIN) 10 MG tablet Take 10 mg by mouth daily as needed for allergies.     nystatin  (MYCOSTATIN /NYSTOP ) powder Apply 1 Application topically 3 (three) times daily. 180 g 0   omega-3 acid ethyl esters (LOVAZA ) 1 g capsule Take 2 g by mouth 2 (two) times daily.      pantoprazole  (PROTONIX ) 40 MG tablet Take 40 mg by mouth every morning.     sacubitril -valsartan  (ENTRESTO ) 97-103 MG Take 1 tablet by mouth 2 (two) times daily. 180 tablet 3   tadalafil  (CIALIS ) 5 MG tablet Take 5 mg by mouth in the morning.     triamcinolone  cream (KENALOG ) 0.1 % Apply 1 application topically 2 (two) times daily as needed (skin irritation).     XARELTO  15 MG TABS tablet Take 15 mg by mouth daily with supper.     Multiple Vitamins-Minerals (PRESERVISION AREDS 2 PO) Take 1 tablet by mouth 2 (two) times daily.     sildenafil  (VIAGRA ) 100 MG tablet Take 100 mg by mouth daily as needed for erectile dysfunction.     spironolactone  (ALDACTONE ) 25 MG tablet Take 0.5 tablets (12.5 mg total) by mouth daily. (Patient taking differently: Take 25 mg by mouth in the morning.) 45 tablet 3   No current facility-administered medications on file prior to visit.    Allergies  Allergen Reactions   Ciprofloxacin  Rash   Escitalopram Oxalate Itching   Gadolinium Derivatives Itching    Pt described sneezing/itchy nose with previous  MRI contrast exams, Dr Marthann wants patient to have 13 hour prep for contrast exams; [facility]   Quinapril Hcl Rash   Sulfa Antibiotics Swelling    Swelling in the ankles     Assessment/Plan:  1. CHF -  Problem  Hfref (Heart Failure With Reduced Ejection Fraction) (Hcc)   Current CHF meds: carvedilol  6.25 mg twice daily, Farxiga  5 mg daily, Entresto  97-103 mg twice daily, spironolactone  12.5 mg daily , lasix  40 mg once a day if needed may have second dose in the afternoon      HFrEF (heart failure with reduced ejection fraction) (HCC) Assessment: In office BP today 134/82 heart rate 54 second reading 143/77 heart rate 55  last LV EF 40-45 % 01/2024  Symptomatically, he is feeling well, Denies dizziness, lightheadedness, and fatigue. Denies  chest pain or palpitations. Able to complete all ADLs. Activity level active. he does not check his weight at home. Denies LEE, PND, or orthopnea. Appetite has been Normal. he mostly  adheres to a low-salt diet. Tolerates current HF meds well without any side effects   Plan:  Reduce lasix  dose from 40 mg daily to 40 mg every other day   Increase Entresto  dose from 49/51 mg twice daily to 97/103 mg twice daily - will check BMP in 3-4 weeks  Continue taking arvedilol 6.25 mg twice daily, Farxiga  10 mg daily,  spironolactone  12.5 mg daily  Patient to keep record of BP readings with heart rate and  report to us  at the next visit Patient to see PharmD in 6-8 weeks for follow up       Thank you   Robbi Blanch, Pharm.D [place] [facility]. [facility] [address], [place], [place] [address] Phone: [phone]; Fax: [phone]

## 2024-04-27 DIAGNOSIS — E890 Postprocedural hypothyroidism: Secondary | ICD-10-CM | POA: Diagnosis not present

## 2024-05-01 LAB — BASIC METABOLIC PANEL WITH GFR
BUN/Creatinine Ratio: 30 — ABNORMAL HIGH (ref 10–24)
BUN: 38 mg/dL — ABNORMAL HIGH (ref 8–27)
CO2: 24 mmol/L (ref 20–29)
Calcium: 10 mg/dL (ref 8.6–10.2)
Chloride: 108 mmol/L — ABNORMAL HIGH (ref 96–106)
Creatinine, Ser: 1.27 mg/dL (ref 0.76–1.27)
Glucose: 134 mg/dL — ABNORMAL HIGH (ref 70–99)
Potassium: 4.7 mmol/L (ref 3.5–5.2)
Sodium: 146 mmol/L — ABNORMAL HIGH (ref 134–144)
eGFR: 59 mL/min/1.73 — ABNORMAL LOW (ref >59)

## 2024-05-04 ENCOUNTER — Telehealth: Payer: Self-pay

## 2024-05-04 NOTE — Telephone Encounter (Signed)
 Called patient to review recent BMP on 04/22/24; Electrolytes and kidney function stable since increasing entresto   from 49/51 mg twice daily to 97/103 mg twice daily and reducing lasix  dose from 40 mg daily to 40 mg every other day. Patient shared recent BP readings: 147/81, 135/82, 135/68, 127/79, 149/71, 128/80, 133/80, 153/86, 149/71. Denies SOB, headache, chest pain, or swelling. Recently had an ablation and has been taking it easy. Notes BP variability; diet and exercise could improve. Encouraged consistent BP monitoring, ~2  hours after taking meds and low salt diet. Follow-up with [facility] in Jan 2026; advised bringing BP log and new monitor.Told patient to continue HTN medications as prescribed. Patient verbalized understanding.

## 2024-05-05 ENCOUNTER — Ambulatory Visit (HOSPITAL_COMMUNITY)
Admission: RE | Admit: 2024-05-05 | Discharge: 2024-05-05 | Disposition: A | Source: Ambulatory Visit | Attending: Physician Assistant | Admitting: Physician Assistant

## 2024-05-05 VITALS — BP 134/90 | HR 56 | Ht 67.0 in | Wt 201.4 lb

## 2024-05-05 DIAGNOSIS — D6869 Other thrombophilia: Secondary | ICD-10-CM

## 2024-05-05 DIAGNOSIS — I4891 Unspecified atrial fibrillation: Secondary | ICD-10-CM

## 2024-05-05 DIAGNOSIS — I480 Paroxysmal atrial fibrillation: Secondary | ICD-10-CM

## 2024-05-05 MED ORDER — RIVAROXABAN 20 MG PO TABS: 20.0000 mg | ORAL_TABLET | Freq: Every day | ORAL | 3 refills | Status: AC

## 2024-05-05 NOTE — Progress Notes (Signed)
 Primary Care Physician: Clarice Nottingham, MD Primary Cardiologist: Newman JINNY Lawrence, MD Electrophysiologist: Eulas FORBES Furbish, MD  Referring Physician: Dr Furbish Lamar Nathan Medina is a 73 y.o. male with a history of HTN, CKD, DM, CHF, CAD s/p CABG 2019, s/p thyroidectomy, atrial fibrillation who presents for follow up in the [facility].  Atrial fibrillation was noted during a visit on December 30, 2023. He was seen by Dr Furbish and underwent afib ablation on 04/07/24. Patient is on Xarelto  for stroke prevention.    Patient presents today for follow up for atrial fibrillation. He remains in SR today and feels well. He thinks he may have had two brief episodes of afib since the ablation but his symptoms were very mild so he was not sure. His groin sites are well healed. He denies any bleeding issues on anticoagulation.   Today, he denies symptoms of palpitations, chest pain, shortness of breath, orthopnea, PND, lower extremity edema, dizziness, presyncope, syncope, snoring, daytime somnolence, bleeding, or neurologic sequela. The patient is tolerating medications without difficulties and is otherwise without complaint today.    Atrial Fibrillation Risk Factors:  he does not have symptoms or diagnosis of sleep apnea. he does not have a history of rheumatic fever.   Atrial Fibrillation Management history:  Previous antiarrhythmic drugs: none Previous cardioversions: none Previous ablations: 04/07/24 Anticoagulation history: Xarelto   ROS- All systems are reviewed and negative except as per the HPI above.  Past Medical History:  Diagnosis Date   Abnormal liver function test 05/23/2011   ALLERGIC RHINITIS    ANXIETY    Aortic atherosclerosis    Arthritis    Ascending aorta dilation 07/24/2018   Bilateral renal cysts    Cervical spondylolysis    Moderate   CHF (congestive heart failure) (HCC) 07/24/2018   Chronic kidney disease    CKD stage 3 per office  visit note 08/08/17 on chart    COLONIC POLYPS, HX OF    Coronary artery disease    DEPRESSION    DIABETES MELLITUS, TYPE II    Diverticulitis    Epidermal cyst    Fatty liver    GERD    GOUT    History of inguinal hernia    HOH (hard of hearing)    elft ear   HYPERLIPIDEMIA    HYPERTENSION    IBS    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, takedown and reversal September 2012   Ischemic cardiomyopathy 07/24/2018   Left renal mass 05/23/2011   Lumbar radicular pain 06/03/2011   Macular degeneration    right eye   Multinodular goiter    Myocardial infarction [facility])    silent MI in Pt's 30s   Pancreatic pseudocyst    Stable   Peripheral vascular disease    Peritonsillar abscess    Pulmonary nodule    Right upper lobe   Radial neck fracture    Right upper lobe pulmonary nodule 07/24/2018   Thyroid  disease    Thyroid  nodule    Bilateral   Vertigo     Current Outpatient Medications  Medication Sig Dispense Refill   acetaminophen  (TYLENOL ) 500 MG tablet Take 500-1,000 mg by mouth every 8 (eight) hours as needed for mild pain (pain score 1-3) or moderate pain (pain score 4-6). (Patient taking differently: Take 500-1,000 mg by mouth as needed for mild pain (pain score 1-3) or moderate pain (pain score 4-6).)     alendronate (FOSAMAX)  70 MG tablet Take 70 mg by mouth once a week.     allopurinol  (ZYLOPRIM ) 300 MG tablet Take 300 mg by mouth daily.     aspirin -acetaminophen -caffeine (EXCEDRIN EXTRA STRENGTH) 250-250-65 MG tablet Take 2 tablets by mouth every 8 (eight) hours as needed for headache or migraine. (Patient taking differently: Take 2 tablets by mouth as needed for headache or migraine.)     atorvastatin  (LIPITOR) 10 MG tablet Take 10 mg by mouth at bedtime.     Blood Pressure Monitoring (OMRON 3 SERIES BP MONITOR) DEVI Use to measure blood pressure as directed. 1 each 0   carvedilol  (COREG ) 6.25 MG tablet Take 1 tablet (6.25 mg  total) by mouth 2 (two) times daily. 180 tablet 3   Cholecalciferol (VITAMIN D-3) 25 MCG (1000 UT) CAPS Take 1,000 Units by mouth at bedtime.     cyanocobalamin  (VITAMIN B12) 1000 MCG tablet Take 1,000 mcg by mouth at bedtime.     dapagliflozin  propanediol (FARXIGA ) 10 MG TABS tablet Take 1 tablet (10 mg total) by mouth daily. 90 tablet 1   diphenhydrAMINE  (BENADRYL ) 25 MG tablet Take 25 mg by mouth every 6 (six) hours as needed for allergies. (Patient taking differently: Take 25 mg by mouth as needed for allergies.)     Dulaglutide 1.5 MG/0.5ML SOAJ Inject 1.5 mg into the skin once a week. Monday     famotidine  (PEPCID ) 40 MG tablet Take 40 mg by mouth at bedtime as needed for heartburn or indigestion.     fluticasone (FLONASE) 50 MCG/ACT nasal spray Place 1 spray into both nostrils daily as needed for allergies or rhinitis.     furosemide  (LASIX ) 40 MG tablet Take 1 tablet (40 mg total) by mouth every other day.     gabapentin (NEURONTIN) 100 MG capsule Take 100 mg by mouth at bedtime.     guaiFENesin  (MUCINEX ) 600 MG 12 hr tablet Take 1,200 mg by mouth 2 (two) times daily as needed for cough or to loosen phlegm.     ibuprofen (ADVIL) 200 MG tablet Take 300 mg by mouth every 6 (six) hours as needed for mild pain (pain score 1-3), headache or moderate pain (pain score 4-6). (Patient taking differently: Take 200 mg by mouth as needed for mild pain (pain score 1-3), headache or moderate pain (pain score 4-6).)     insulin  glargine (LANTUS ) 100 UNIT/ML Solostar Pen Inject 32 Units into the skin at bedtime.     levothyroxine  (SYNTHROID ) 175 MCG tablet Take 175 mcg by mouth at bedtime.     loratadine (CLARITIN) 10 MG tablet Take 10 mg by mouth daily as needed for allergies. (Patient taking differently: Take 10 mg by mouth as needed for allergies.)     Multiple Vitamins-Minerals (PRESERVISION AREDS 2 PO) Take 1 tablet by mouth 2 (two) times daily.     nystatin  (MYCOSTATIN /NYSTOP ) powder Apply 1  Application topically 3 (three) times daily. (Patient taking differently: Apply 1 Application topically as needed.) 180 g 0   omega-3 acid ethyl esters (LOVAZA ) 1 g capsule Take 2 g by mouth 2 (two) times daily.      pantoprazole  (PROTONIX ) 40 MG tablet Take 40 mg by mouth every morning.     sacubitril -valsartan  (ENTRESTO ) 97-103 MG Take 1 tablet by mouth 2 (two) times daily. 180 tablet 3   sildenafil (VIAGRA) 25 MG tablet SMARTSIG:1-2 Tablet(s) By Mouth PRN     spironolactone  (ALDACTONE ) 25 MG tablet Take 0.5 tablets (12.5 mg total) by mouth daily. 45  tablet 3   tadalafil (CIALIS) 5 MG tablet Take 5 mg by mouth in the morning.     triamcinolone  cream (KENALOG ) 0.1 % Apply 1 application topically 2 (two) times daily as needed (skin irritation).     XARELTO  15 MG TABS tablet Take 15 mg by mouth daily with supper.     No current facility-administered medications for this encounter.    Physical Exam: BP (!) 134/90   Pulse (!) 56   Ht 5' 7 (1.702 m)   Wt 91.4 kg   BMI 31.54 kg/m   GEN: Well nourished, well developed in no acute distress CARDIAC: Regular rate and rhythm, no murmurs, rubs, gallops RESPIRATORY:  Clear to auscultation without rales, wheezing or rhonchi  ABDOMEN: Soft, non-tender, non-distended EXTREMITIES:  No edema; No deformity   Wt Readings from Last 3 Encounters:  05/05/24 91.4 kg  04/07/24 88 kg  03/25/24 89.4 kg     EKG Interpretation Date/Time:  Tuesday May 05 2024 11:43:18 EST Ventricular Rate:  56 PR Interval:  180 QRS Duration:  98 QT Interval:  384 QTC Calculation: 370 R Axis:   -24  Text Interpretation: Sinus bradycardia Anteroseptal infarct (cited on or before 22-Jun-2020) T wave abnormality, consider lateral ischemia Abnormal ECG When compared with ECG of 07-Apr-2024 15:22,  No significant change was found  Confirmed by Josejulian Tarango ([id number]) on 05/05/2024 11:50:20 AM    Echo 02/11/24 demonstrated   1. Left ventricular ejection fraction, by  estimation, is 40 to 45%. The  left ventricle has mildly decreased function. The left ventricle  demonstrates regional wall motion abnormalities (see scoring  diagram/findings for description). There is mild left  ventricular hypertrophy. Left ventricular diastolic parameters are  consistent with Grade I diastolic dysfunction (impaired relaxation).   2. Right ventricular systolic function is mildly reduced. The right  ventricular size is normal. Tricuspid regurgitation signal is inadequate  for assessing PA pressure.   3. Left atrial size was mildly dilated.   4. The mitral valve is normal in structure. Trivial mitral valve  regurgitation. No evidence of mitral stenosis.   5. The aortic valve was not well visualized. Aortic valve regurgitation  is trivial. No aortic stenosis is present.   6. Aortic dilatation noted. There is mild dilatation of the ascending  aorta, measuring 39 mm.   7. The inferior vena cava is normal in size with greater than 50%  respiratory variability, suggesting right atrial pressure of 3 mmHg.    CHA2DS2-VASc Score = 5  The patient's score is based upon: CHF History: 1 HTN History: 1 Diabetes History: 1 Stroke History: 0 Vascular Disease History: 1 Age Score: 1 Gender Score: 0       ASSESSMENT AND PLAN: Paroxysmal Atrial Fibrillation (ICD10:  I48.0) The patient's CHA2DS2-VASc score is 5, indicating a 7.2% annual risk of stroke.   S/p afib ablation 04/07/24 Patient appears to be maintaining SR Continue carvedilol  6.25 mg BID His renal function has improved, CrCl now >50 mL/min. Will increase Xarelto  to 20 mg daily.  Secondary Hypercoagulable State (ICD10:  D68.69) The patient is at significant risk for stroke/thromboembolism based upon his CHA2DS2-VASc Score of 5.  Continue Rivaroxaban  (Xarelto ). Increase dose to 20 mg daily with CrCl now above 50.   CAD S/p CABG No anginal symptoms Followed by Dr Elmira  Chronic HFrEF EF 40-45% GDMT per  primary cardiology team Fluid status appears stable today  HTN Stable on current regimen   Follow up with Dr Nancey as scheduled.  [facility] [address] [place], [place] [address] [phone]

## 2024-05-12 ENCOUNTER — Ambulatory Visit

## 2024-05-28 ENCOUNTER — Ambulatory Visit (HOSPITAL_COMMUNITY)
Admission: RE | Admit: 2024-05-28 | Discharge: 2024-05-28 | Disposition: A | Discharge status: Home / Self Care | Source: Ambulatory Visit | Attending: Cardiovascular Disease | Admitting: Cardiovascular Disease

## 2024-05-28 DIAGNOSIS — R911 Solitary pulmonary nodule: Secondary | ICD-10-CM | POA: Insufficient documentation

## 2024-05-28 DIAGNOSIS — I700 Atherosclerosis of aorta: Secondary | ICD-10-CM | POA: Insufficient documentation

## 2024-05-28 DIAGNOSIS — I7121 Aneurysm of the ascending aorta, without rupture: Secondary | ICD-10-CM | POA: Insufficient documentation

## 2024-05-28 DIAGNOSIS — K8020 Calculus of gallbladder without cholecystitis without obstruction: Secondary | ICD-10-CM | POA: Diagnosis not present

## 2024-05-28 DIAGNOSIS — N281 Cyst of kidney, acquired: Secondary | ICD-10-CM | POA: Insufficient documentation

## 2024-05-28 DIAGNOSIS — I517 Cardiomegaly: Secondary | ICD-10-CM | POA: Insufficient documentation

## 2024-05-28 DIAGNOSIS — I2510 Atherosclerotic heart disease of native coronary artery without angina pectoris: Secondary | ICD-10-CM | POA: Diagnosis not present

## 2024-06-01 ENCOUNTER — Encounter: Payer: Self-pay | Admitting: Neurology

## 2024-06-01 ENCOUNTER — Telehealth: Payer: Self-pay | Admitting: *Deleted

## 2024-06-01 ENCOUNTER — Ambulatory Visit: Admitting: Neurology

## 2024-06-01 VITALS — BP 119/82 | HR 57 | Ht 67.0 in | Wt 198.0 lb

## 2024-06-01 DIAGNOSIS — G5131 Clonic hemifacial spasm, right: Secondary | ICD-10-CM

## 2024-06-01 NOTE — Telephone Encounter (Signed)
 SABRA

## 2024-06-01 NOTE — Progress Notes (Signed)
 "  Chief Complaint  Patient presents with   New Patient (Initial Visit)    Pt in room 14.alone.  Paper proficient referral for right eye twitching.      ASSESSMENT AND PLAN  Nathan Medina is a 74 y.o. male   Right hemifacial spasm   Prior authorization for EMG guided xeomin injection, 50 units,  Return to clinic in 1 months for injection   DIAGNOSTIC DATA (LABS, IMAGING, TESTING) - I reviewed patient records, labs, notes, testing and imaging myself where available.   MEDICAL HISTORY:  Nathan Medina is a 74 year old male, seen in request by his primary care doctor Clarice Nottingham, for evaluation of right hemifacial spasm initial evaluation June 01, 2024  History is obtained from the patient and review of electronic medical records. I personally reviewed pertinent available imaging films in PACS.   PMHx of  HTN HLD Hypothyroidism CAD, s/p CABG A fib on Xarelto  Gout  He enjoys reading, since 2025, he noticed gradual onset right eye muscle twitching, intermittent, gradually getting worse, now present 50% of the day, to the point difficult for him to wreak, difficult for him to go to sleep, some spreading to right cheek muscle, he denies visual change, denies pain, sensory loss  Laboratory evaluation from August 2025 by primary care hemoglobin 17.9, creatinine 1.43, A1c 6.8, LDL 40, TSH 0.4,  PHYSICAL EXAM:   Vitals:   06/01/24 1047  BP: 119/82  Pulse: (!) 57  Weight: 198 lb (89.8 kg)  Height: 5' 7 (1.702 m)   Body mass index is 31.01 kg/m.  PHYSICAL EXAMNIATION:  Gen: NAD, conversant, well nourised, well groomed                     Cardiovascular: Regular rate rhythm, no peripheral edema, warm, nontender. Eyes: Conjunctivae clear without exudates or hemorrhage Neck: Supple, no carotid bruits. Pulmonary: Clear to auscultation bilaterally   NEUROLOGICAL EXAM:  MENTAL STATUS: Speech/cognition: Awake, alert, oriented to history taking and casual  conversation CRANIAL NERVES: CN II: Visual fields are full to confrontation. Pupils are round equal and briskly reactive to light. CN III, IV, VI: extraocular movement are normal. No ptosis. CN V: Facial sensation is intact to light touch CN VII: Occasionally right  orbicularis oculi muscle twitching, mostly involving right lower eyelid, some spreading to right cheek, face is symmetric with normal eye closure  CN VIII: Hearing is normal to causal conversation. CN IX, X: Phonation is normal. CN XI: Head turning and shoulder shrug are intact  MOTOR: There is no pronator drift of out-stretched arms. Muscle bulk and tone are normal. Muscle strength is normal.  REFLEXES: Reflexes are 2+ and symmetric at the biceps, triceps, knees, and ankles. Plantar responses are flexor.  SENSORY: Intact to light touch, pinprick and vibratory sensation are intact in fingers and toes.  COORDINATION: There is no trunk or limb dysmetria noted.  GAIT/STANCE: Posture is normal. Gait is steady   REVIEW OF SYSTEMS:  Full 14 system review of systems performed and notable only for as above All other review of systems were negative.   ALLERGIES: Allergies[1]  HOME MEDICATIONS: Current Outpatient Medications  Medication Sig Dispense Refill   acetaminophen  (TYLENOL ) 500 MG tablet Take 500-1,000 mg by mouth every 8 (eight) hours as needed for mild pain (pain score 1-3) or moderate pain (pain score 4-6). (Patient taking differently: Take 500-1,000 mg by mouth as needed for mild pain (pain score 1-3) or moderate pain (pain score 4-6).)  alendronate (FOSAMAX) 70 MG tablet Take 70 mg by mouth once a week.     allopurinol  (ZYLOPRIM ) 300 MG tablet Take 300 mg by mouth daily.     aspirin -acetaminophen -caffeine (EXCEDRIN EXTRA STRENGTH) 250-250-65 MG tablet Take 2 tablets by mouth every 8 (eight) hours as needed for headache or migraine. (Patient taking differently: Take 2 tablets by mouth as needed for headache or  migraine.)     atorvastatin  (LIPITOR) 10 MG tablet Take 10 mg by mouth at bedtime.     Blood Pressure Monitoring (OMRON 3 SERIES BP MONITOR) DEVI Use to measure blood pressure as directed. 1 each 0   carvedilol  (COREG ) 6.25 MG tablet Take 1 tablet (6.25 mg total) by mouth 2 (two) times daily. 180 tablet 3   Cholecalciferol (VITAMIN D-3) 25 MCG (1000 UT) CAPS Take 1,000 Units by mouth at bedtime.     cyanocobalamin  (VITAMIN B12) 1000 MCG tablet Take 1,000 mcg by mouth at bedtime.     dapagliflozin  propanediol (FARXIGA ) 10 MG TABS tablet Take 1 tablet (10 mg total) by mouth daily. 90 tablet 1   diphenhydrAMINE  (BENADRYL ) 25 MG tablet Take 25 mg by mouth every 6 (six) hours as needed for allergies. (Patient taking differently: Take 25 mg by mouth as needed for allergies.)     Dulaglutide 1.5 MG/0.5ML SOAJ Inject 1.5 mg into the skin once a week. Monday     famotidine  (PEPCID ) 40 MG tablet Take 40 mg by mouth at bedtime as needed for heartburn or indigestion.     fluticasone (FLONASE) 50 MCG/ACT nasal spray Place 1 spray into both nostrils daily as needed for allergies or rhinitis.     furosemide  (LASIX ) 40 MG tablet Take 1 tablet (40 mg total) by mouth every other day.     gabapentin (NEURONTIN) 100 MG capsule Take 100 mg by mouth at bedtime.     guaiFENesin  (MUCINEX ) 600 MG 12 hr tablet Take 1,200 mg by mouth 2 (two) times daily as needed for cough or to loosen phlegm.     ibuprofen (ADVIL) 200 MG tablet Take 300 mg by mouth every 6 (six) hours as needed for mild pain (pain score 1-3), headache or moderate pain (pain score 4-6). (Patient taking differently: Take 200 mg by mouth as needed for mild pain (pain score 1-3), headache or moderate pain (pain score 4-6).)     insulin  glargine (LANTUS ) 100 UNIT/ML Solostar Pen Inject 32 Units into the skin at bedtime.     levothyroxine  (SYNTHROID ) 175 MCG tablet Take 175 mcg by mouth at bedtime.     loratadine (CLARITIN) 10 MG tablet Take 10 mg by mouth daily  as needed for allergies. (Patient taking differently: Take 10 mg by mouth as needed for allergies.)     Multiple Vitamins-Minerals (PRESERVISION AREDS 2 PO) Take 1 tablet by mouth 2 (two) times daily.     nystatin  (MYCOSTATIN /NYSTOP ) powder Apply 1 Application topically 3 (three) times daily. (Patient taking differently: Apply 1 Application topically as needed.) 180 g 0   omega-3 acid ethyl esters (LOVAZA ) 1 g capsule Take 2 g by mouth 2 (two) times daily.      pantoprazole  (PROTONIX ) 40 MG tablet Take 40 mg by mouth every morning.     rivaroxaban  (XARELTO ) 20 MG TABS tablet Take 1 tablet (20 mg total) by mouth daily with supper. 30 tablet 3   sacubitril -valsartan  (ENTRESTO ) 97-103 MG Take 1 tablet by mouth 2 (two) times daily. 180 tablet 3   sildenafil (VIAGRA) 25 MG tablet  SMARTSIG:1-2 Tablet(s) By Mouth PRN     spironolactone  (ALDACTONE ) 25 MG tablet Take 0.5 tablets (12.5 mg total) by mouth daily. 45 tablet 3   tadalafil (CIALIS) 5 MG tablet Take 5 mg by mouth in the morning.     triamcinolone  cream (KENALOG ) 0.1 % Apply 1 application topically 2 (two) times daily as needed (skin irritation).     No current facility-administered medications for this visit.    PAST MEDICAL HISTORY: Past Medical History:  Diagnosis Date   Abnormal liver function test 05/23/2011   ALLERGIC RHINITIS    ANXIETY    Aortic atherosclerosis    Arthritis    Ascending aorta dilation 07/24/2018   Bilateral renal cysts    Cervical spondylolysis    Moderate   CHF (congestive heart failure) (HCC) 07/24/2018   Chronic kidney disease    CKD stage 3 per office visit note 08/08/17 on chart    COLONIC POLYPS, HX OF    Coronary artery disease    DEPRESSION    DIABETES MELLITUS, TYPE II    Diverticulitis    Epidermal cyst    Fatty liver    GERD    GOUT    History of inguinal hernia    HOH (hard of hearing)    elft ear   HYPERLIPIDEMIA    HYPERTENSION    IBS    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, takedown and reversal September 2012   Ischemic cardiomyopathy 07/24/2018   Left renal mass 05/23/2011   Lumbar radicular pain 06/03/2011   Macular degeneration    right eye   Multinodular goiter    Myocardial infarction [facility])    silent MI in Pt's 30s   Pancreatic pseudocyst    Stable   Peripheral vascular disease    Peritonsillar abscess    Pulmonary nodule    Right upper lobe   Radial neck fracture    Right upper lobe pulmonary nodule 07/24/2018   Thyroid  disease    Thyroid  nodule    Bilateral   Vertigo     PAST SURGICAL HISTORY: Past Surgical History:  Procedure Laterality Date   APPENDECTOMY  02/12/11   ATRIAL FIBRILLATION ABLATION N/A 04/07/2024   Procedure: ATRIAL FIBRILLATION ABLATION;  Surgeon: Nancey Eulas BRAVO, MD;  Location: [facility];  Service: Cardiovascular;  Laterality: N/A;   COLON SURGERY  08/11/10   sigmoid colectomy   COLON SURGERY  02/12/11   colostomy takedown   COLONOSCOPY     CORONARY ARTERY BYPASS GRAFT N/A 04/01/2018   Procedure: CORONARY ARTERY BYPASS GRAFTING (CABG) x Three, using left internal mammary artery and right leg greater saphenous vein harvested endoscopically;  Surgeon: Army Dallas NOVAK, MD;  Location: [facility] OR;  Service: Open Heart Surgery;  Laterality: N/A;   CYST REMOVAL TRUNK Right 02/20/2018   Procedure: epidermal cyst excision right lower back;  Surgeon: Eletha Boas, MD;  Location: [facility];  Service: General;  Laterality: Right;   HERNIA REPAIR     INSERTION OF MESH N/A 08/20/2012   Procedure: INSERTION OF MESH;  Surgeon: Deward GORMAN Curvin DOUGLAS, MD;  Location: [facility];  Service: General;  Laterality: N/A;   IR RADIOLOGIST EVAL & MGMT  03/23/2020   IR RADIOLOGIST EVAL & MGMT  07/26/2020   IR RADIOLOGIST EVAL & MGMT  11/01/2020   IR RADIOLOGIST EVAL & MGMT  05/10/2021   KNEE ARTHROSCOPY Right 10/18/2019   LEFT HEART CATH AND CORONARY ANGIOGRAPHY N/A 01/14/2018  Procedure: LEFT HEART CATH AND CORONARY  ANGIOGRAPHY;  Surgeon: Elmira Newman PARAS, MD;  Location: [facility];  Service: Cardiovascular;  Laterality: N/A;   LYSIS OF ADHESION  08/20/2012   Procedure: LYSIS OF ADHESION;  Surgeon: Deward GORMAN Curvin DOUGLAS, MD;  Location: [facility];  Service: General;;   QUADRICEPS TENDON REPAIR Right 10/20/2019   Procedure: REPAIR RIGHT QUADRICEP TENDON;  Surgeon: Ernie Cough, MD;  Location: [facility] OR;  Service: Orthopedics;  Laterality: Right;   RADIOLOGY WITH ANESTHESIA N/A 06/22/2020   Procedure: CT WITH ANESTHESIA  CRYOABLATION;  Surgeon: Vanice Sharper, MD;  Location: [facility];  Service: Anesthesiology;  Laterality: N/A;   RADIOLOGY WITH ANESTHESIA N/A 07/06/2020   Procedure: CT WITH ANESTHESIA;  Surgeon: Vanice Sharper, MD;  Location: [facility];  Service: Anesthesiology;  Laterality: N/A;   SEPTOPLASTY  age 52   TEE WITHOUT CARDIOVERSION N/A 04/01/2018   Procedure: TRANSESOPHAGEAL ECHOCARDIOGRAM (TEE);  Surgeon: Army Dallas NOVAK, MD;  Location: [facility] OR;  Service: Open Heart Surgery;  Laterality: N/A;   THYROIDECTOMY N/A 10/17/2017   Procedure: TOTAL THYROIDECTOMY;  Surgeon: Eletha Boas, MD;  Location: [facility];  Service: General;  Laterality: N/A;   TONSILLECTOMY Right 08/01/2016   Procedure: INCISION AND DRAINAGE RIGHT PERI-TONSILLAR ABSCESS;  Surgeon: Marlyce Finer, MD;  Location: [facility];  Service: ENT;  Laterality: Right;   ULTRASOUND GUIDANCE FOR VASCULAR ACCESS  01/14/2018   Procedure: Ultrasound Guidance For Vascular Access;  Surgeon: Elmira Newman PARAS, MD;  Location: [facility];  Service: Cardiovascular;;   VENTRAL HERNIA REPAIR  08/20/2012   Procedure: HERNIA REPAIR VENTRAL ADULT;  Surgeon: Deward GORMAN Curvin DOUGLAS, MD;  Location: [facility];  Service: General;;    FAMILY HISTORY: Family History  Problem Relation Age of Onset   Hypertension Paternal Aunt    Stroke Maternal Grandmother    Hypertension Maternal Grandmother     SOCIAL HISTORY: Social History   Socioeconomic History   Marital status:  Single    Spouse name: Not on file   Number of children: 0   Years of education: Not on file   Highest education level: Not on file  Occupational History   Not on file  Tobacco Use   Smoking status: Never   Smokeless tobacco: Never  Vaping Use   Vaping status: Never Used  Substance and Sexual Activity   Alcohol use: Yes    Comment: occasionally   Drug use: No   Sexual activity: Not on file  Other Topics Concern   Not on file  Social History Narrative   Not on file   Social Drivers of Health   Tobacco Use: Low Risk (06/01/2024)   Patient History    Smoking Tobacco Use: Never    Smokeless Tobacco Use: Never    Passive Exposure: Not on file  Financial Resource Strain: Not on file  Food Insecurity: Low Risk (02/05/2023)   Received from [facility]   Epic    Within the past 12 months, you worried that your food would run out before you got money to buy more: Never true    Within the past 12 months, the food you bought just didn't last and you didn't have money to get more. : Never true  Transportation Needs: No Transportation Needs (02/05/2023)   Received from [facility]    In the past 12 months, has lack of reliable transportation kept you from medical appointments, meetings, work or from getting things needed for daily living? :  No  Physical Activity: Not on file  Stress: Not on file  Social Connections: Not on file  Intimate Partner Violence: Not on file  Depression (EYV7-0): Not on file  Alcohol Screen: Not on file  Housing: Low Risk (02/05/2023)   Received from [facility]   Epic    What is your living situation today?: I have a steady place to live    Think about the place you live. Do you have problems with any of the following? Choose all that apply:: None/None on this list  Utilities: Low Risk (02/05/2023)   Received from [facility]   Utilities    In the past 12 months has the electric, gas, oil, or water company threatened to shut off  services in your home? : No  Health Literacy: Not on file      Modena Callander, M.D. Ph.D.  [facility] [address] [place], [place] [address] Ph: [phone] Fax: [phone]  CC:  Clarice Nottingham, MD [address] [place],  [place] [address]  Clarice Nottingham, MD      [1]  Allergies Allergen Reactions   Ciprofloxacin  Rash   Escitalopram Oxalate Itching   Gadolinium Derivatives Itching    Pt described sneezing/itchy nose with previous MRI contrast exams, Dr Marthann wants patient to have 13 hour prep for contrast exams; [facility]   Quinapril Hcl Rash   Sulfa Antibiotics Swelling    Swelling in the ankles   "

## 2024-06-02 NOTE — Telephone Encounter (Signed)
 Submitted auth request via CMM, status is pending. Key: [id number]

## 2024-06-04 ENCOUNTER — Ambulatory Visit

## 2024-06-04 VITALS — BP 119/58 | HR 67 | Resp 18 | Ht 67.0 in | Wt 195.0 lb

## 2024-06-04 DIAGNOSIS — I7121 Aneurysm of the ascending aorta, without rupture: Secondary | ICD-10-CM | POA: Diagnosis not present

## 2024-06-04 DIAGNOSIS — R911 Solitary pulmonary nodule: Secondary | ICD-10-CM | POA: Diagnosis not present

## 2024-06-04 NOTE — Patient Instructions (Addendum)
 Follow up in one year with CT scan of chest without contrast for surveillance of pulmonary nodule and ascending thoracic aortic aneurysm   Risk Modification in those with ascending thoracic aortic aneurysm:   Continue control of blood pressure (prefer BP 130/80 or less)   2. Avoid fluoroquinolone antibiotics (I.e Ciprofloxacin , Avelox, Levofloxacin , Ofloxacin)   3.  Use of statin (to decrease cardiovascular risk)   4.  Exercise and activity limitations is individualized, but in general, contact sports are to be avoided and one should avoid heavy lifting (defined as half of ideal body weight) and exercises involving sustained Valsalva maneuver.   5.  Follow-up in one year with CT chest.

## 2024-06-04 NOTE — Progress Notes (Signed)
 "      [address] [place] [address]             [phone]            [facility] [id number] [birth year]   History of Present Illness:  Nathan Medina is a 74 year old man with medical history of hypertension, CAD, s/p CAGB x3 with Dr. Army in 2019, ischemic cardiomyopathy, hyperlipidemia, type 2 diabetes, paroxysmal atrial fibrillation, thyroid  cancer, renal cell carcinoma s/p ablation, and gout who presents for continued follow up of right upper lobe lung nodule and ascending thoracic aortic aneurysm.   Ground Glass opacity in the right upper lobe was first noted in 2019 and there has been slow growth over the past years. He was last seen in clinic by Dr. Kerrin on 05/07/2023 and ground glass opacity was stable.  On recent CT scan of chest ground glass opacity is still stable in size.  Also newly identified ascending thoracic aortic aneurysm was found and measured 4.0 cm.   He presents to the clinic today and reports that he is doing well.  His blood pressures is controlled with current medication therapy.  He is active and goes to the gym with a systems analyst. He denies heavy lifting.  He denies chest pain, shortness of breath, cough, wheezing.     Medications Ordered Prior to Encounter[1]   ROS: Review of Systems  Constitutional:  Negative for fever and malaise/fatigue.  Respiratory:  Negative for cough, shortness of breath and wheezing.   Cardiovascular:  Positive for leg swelling. Negative for chest pain and palpitations.  Neurological:  Negative for dizziness and headaches.     BP (!) 119/58   Pulse 67   Resp 18   Ht 5' 7 (1.702 m)   Wt 195 lb (88.5 kg)   BMI 30.54 kg/m   Physical Exam Constitutional:      Appearance: Normal appearance.  HENT:     Head: Normocephalic and atraumatic.  Cardiovascular:     Rate and Rhythm: Normal rate and regular rhythm.     Heart sounds: Normal heart sounds, S1 normal and S2 normal.   Pulmonary:     Effort: Pulmonary effort is normal.     Breath sounds: Normal breath sounds.  Skin:    General: Skin is warm and dry.  Neurological:     General: No focal deficit present.     Mental Status: He is alert and oriented to person, place, and time.      Imaging: CLINICAL DATA:  Lung nodule follow-up. History of thyroid  cancer and renal mass ablation.   EXAM: CT CHEST WITHOUT CONTRAST   TECHNIQUE: Multidetector CT imaging of the chest was performed following the standard protocol without IV contrast.   RADIATION DOSE REDUCTION: This exam was performed according to the departmental dose-optimization program which includes automated exposure control, adjustment of the mA and/or kV according to patient size and/or use of iterative reconstruction technique.   COMPARISON:  CT 04/30/2023, CT 01/18/2022, CT 08/06/2017 and PET-CT 08/26/2017   FINDINGS: Cardiovascular: Mild cardiomegaly. Calcified plaque over the left main and 3 vessel coronary arteries. Previous median sternotomy. Ascending thoracic aorta measures 4 cm in AP diameter unchanged. Mild calcified plaque throughout the descending thoracic aorta. Pulmonary arterial system and remaining vascular structures are unremarkable on this noncontrast exam.   Mediastinum/Nodes: No significant mediastinal or hilar adenopathy. Remaining mediastinal structures are unremarkable.   Lungs/Pleura: Lungs are adequately  inflated. No acute airspace process or effusion. Stable ground-glass nodule over the posterior right upper lobe measuring 2 x 2.2 without focal solid component. This has increased in size from the initial CT 2019 and has demonstrated low-level uptake on previous PET CTs. This has been stable over the past couple years. No additional concerning pulmonary nodules. Airways are normal.   Upper Abdomen: Cholelithiasis. Stable simple right renal cyst. Calcified plaque over the abdominal aorta. No acute  findings.   Musculoskeletal: No focal abnormality.   IMPRESSION: 1. Stable 2 x 2.2 cm pure ground-glass nodule over the posterior right upper lobe without solid component. This has been stable over the past couple years with slight increase in size compared to 2019. Low metabolic uptake on previous PET CTs. This remains suspicious for low-grade adenocarcinoma, although remains stable. Recommend clinical correlation and consider continued annual CT surveillance. 2. Mild cardiomegaly with atherosclerotic coronary artery disease. 3. Stable 4 cm ascending thoracic aortic aneurysm. Recommend annual imaging followup by CTA or MRA. This recommendation follows 2010 ACCF/AHA/AATS/ACR/ASA/SCA/SCAI/SIR/STS/SVM Guidelines for the Diagnosis and Management of Patients with Thoracic Aortic Disease. Circulation. 2010; 121: Z733-z630. Aortic aneurysm NOS (ICD10-I71.9). 4. Cholelithiasis. 5. Stable simple right renal cyst. 6. Aortic atherosclerosis.   Aortic Atherosclerosis (ICD10-I70.0).     Electronically Signed   By: Nathan Medina M.D.   On: 05/28/2024 10:09       A/P: Right upper lobe pulmonary nodule -Ground glass opacity in the right upper lobe has remained stable in size when compared to previous years scan.  When compared to 2019 there has been slow growth over time.  -Discussed that this could be low-grade adenocarcinoma  -He would like to continue radiographic follow up at this time  Aneurysm of ascending aorta without rupture -4.0 cm ascending thoracic aortic aneurysm on CT scan of chest -We discussed the natural history and and risk factors for growth of ascending aortic aneurysms. Discussed recommendations to minimize the risk of further expansion or dissection including careful blood pressure control, avoidance of contact sports and heavy lifting, attention to lipid management.  We covered the importance of staying never user of tobacco.  The patient does not yet meet surgical  criteria of >5.5cm. The patient is aware of signs and symptoms of aortic dissection and when to present to the emergency department   -Follow up in one year with CT scan of chest without contrast for surveillance of pulmonary nodule and ascending thoracic aortic aneurysm   Nathan CHRISTELLA Rough, PA-C 06/04/24     [1]  Current Outpatient Medications on File Prior to Visit  Medication Sig Dispense Refill   acetaminophen  (TYLENOL ) 500 MG tablet Take 500-1,000 mg by mouth every 8 (eight) hours as needed for mild pain (pain score 1-3) or moderate pain (pain score 4-6). (Patient taking differently: Take 500-1,000 mg by mouth as needed for mild pain (pain score 1-3) or moderate pain (pain score 4-6).)     alendronate (FOSAMAX) 70 MG tablet Take 70 mg by mouth once a week.     allopurinol  (ZYLOPRIM ) 300 MG tablet Take 300 mg by mouth daily.     aspirin -acetaminophen -caffeine (EXCEDRIN EXTRA STRENGTH) 250-250-65 MG tablet Take 2 tablets by mouth every 8 (eight) hours as needed for headache or migraine. (Patient taking differently: Take 2 tablets by mouth as needed for headache or migraine.)     atorvastatin  (LIPITOR) 10 MG tablet Take 10 mg by mouth at bedtime.     Blood Pressure Monitoring (OMRON 3 SERIES BP MONITOR) DEVI  Use to measure blood pressure as directed. 1 each 0   carvedilol  (COREG ) 6.25 MG tablet Take 1 tablet (6.25 mg total) by mouth 2 (two) times daily. 180 tablet 3   Cholecalciferol (VITAMIN D-3) 25 MCG (1000 UT) CAPS Take 1,000 Units by mouth at bedtime.     cyanocobalamin  (VITAMIN B12) 1000 MCG tablet Take 1,000 mcg by mouth at bedtime.     dapagliflozin  propanediol (FARXIGA ) 10 MG TABS tablet Take 1 tablet (10 mg total) by mouth daily. 90 tablet 1   diphenhydrAMINE  (BENADRYL ) 25 MG tablet Take 25 mg by mouth every 6 (six) hours as needed for allergies. (Patient taking differently: Take 25 mg by mouth as needed for allergies.)     Dulaglutide 1.5 MG/0.5ML SOAJ Inject 1.5 mg into the skin  once a week. Monday     famotidine  (PEPCID ) 40 MG tablet Take 40 mg by mouth at bedtime as needed for heartburn or indigestion.     fluticasone (FLONASE) 50 MCG/ACT nasal spray Place 1 spray into both nostrils daily as needed for allergies or rhinitis.     furosemide  (LASIX ) 40 MG tablet Take 1 tablet (40 mg total) by mouth every other day.     gabapentin (NEURONTIN) 100 MG capsule Take 100 mg by mouth at bedtime.     guaiFENesin  (MUCINEX ) 600 MG 12 hr tablet Take 1,200 mg by mouth 2 (two) times daily as needed for cough or to loosen phlegm.     ibuprofen (ADVIL) 200 MG tablet Take 300 mg by mouth every 6 (six) hours as needed for mild pain (pain score 1-3), headache or moderate pain (pain score 4-6). (Patient taking differently: Take 200 mg by mouth as needed for mild pain (pain score 1-3), headache or moderate pain (pain score 4-6).)     insulin  glargine (LANTUS ) 100 UNIT/ML Solostar Pen Inject 32 Units into the skin at bedtime.     levothyroxine  (SYNTHROID ) 175 MCG tablet Take 175 mcg by mouth at bedtime.     loratadine (CLARITIN) 10 MG tablet Take 10 mg by mouth daily as needed for allergies. (Patient taking differently: Take 10 mg by mouth as needed for allergies.)     Multiple Vitamins-Minerals (PRESERVISION AREDS 2 PO) Take 1 tablet by mouth 2 (two) times daily.     nystatin  (MYCOSTATIN /NYSTOP ) powder Apply 1 Application topically 3 (three) times daily. (Patient taking differently: Apply 1 Application topically as needed.) 180 g 0   omega-3 acid ethyl esters (LOVAZA ) 1 g capsule Take 2 g by mouth 2 (two) times daily.      pantoprazole  (PROTONIX ) 40 MG tablet Take 40 mg by mouth every morning.     rivaroxaban  (XARELTO ) 20 MG TABS tablet Take 1 tablet (20 mg total) by mouth daily with supper. 30 tablet 3   sacubitril -valsartan  (ENTRESTO ) 97-103 MG Take 1 tablet by mouth 2 (two) times daily. 180 tablet 3   sildenafil (VIAGRA) 25 MG tablet SMARTSIG:1-2 Tablet(s) By Mouth PRN     spironolactone   (ALDACTONE ) 25 MG tablet Take 0.5 tablets (12.5 mg total) by mouth daily. 45 tablet 3   tadalafil (CIALIS) 5 MG tablet Take 5 mg by mouth in the morning.     triamcinolone  cream (KENALOG ) 0.1 % Apply 1 application topically 2 (two) times daily as needed (skin irritation).     No current facility-administered medications on file prior to visit.   "

## 2024-06-07 NOTE — Progress Notes (Unsigned)
 Patient ID: Nathan Medina                 DOB: May 14, 1951                      MRN: [id number]     HPI: Nathan Medina is a 74 y.o. male referred by Dr. Elmira  to pharmacy clinic for HF medication management. PMH is significant for  hypertension, CKD stage III, and 2 diabetes mellitus, ischemic cardiomyopathy with recovered EF, coronary artery disease s/p CABGX3 (LIMA-LAD, SVG-dRCA, SVG-ramus) in 2019, paroxysmal A-fib,  stable renal and lung nodules, s/p thyroidectomy, stable thrombocytopenia. Most recent LVEF 40-45 % on 01/2024.  Electrolytes and kidney function stable since increasing entresto   from 49/51 mg twice daily to 97/103 mg twice daily and reducing lasix  dose from 40 mg daily to 40 mg every other day.  Current CHF meds: Carvedilol  6.25 mg twice daily, Farxiga  10 mg daily, Entresto  97/103 mg twice daily, Spironolactone  12.5 mg daily, Lasix  40 mg every other day   BP goal: <130/80   Family History:  Relation Problem Comments  Mother (Deceased at age 2)   Father (Deceased at age 50) Died from MI at age 36   Sister (Alive)   Paternal Aunt Hypertension     Maternal Grandmother Hypertension   Stroke in her 9's       Social History:  Alcohol: rarely  Smoking: none  Diet: follows low carb and low fat diet   Exercise: Gym once a week- balance and strength building Walks -  laps around the house   Home BP readings:   Wt Readings from Last 3 Encounters:  06/04/24 195 lb (88.5 kg)  06/01/24 198 lb (89.8 kg)  05/05/24 201 lb 6.4 oz (91.4 kg)   BP Readings from Last 3 Encounters:  06/04/24 (!) 119/58  06/01/24 119/82  05/05/24 (!) 134/90   Pulse Readings from Last 3 Encounters:  06/04/24 67  06/01/24 (!) 57  05/05/24 (!) 56    Renal function: CrCl cannot be calculated (Patient's most recent lab result is older than the maximum 21 days allowed.).  Past Medical History:  Diagnosis Date   Abnormal liver function test 05/23/2011   ALLERGIC RHINITIS     ANXIETY    Aortic atherosclerosis    Arthritis    Ascending aorta dilation 07/24/2018   Bilateral renal cysts    Cervical spondylolysis    Moderate   CHF (congestive heart failure) (HCC) 07/24/2018   Chronic kidney disease    CKD stage 3 per office visit note 08/08/17 on chart    COLONIC POLYPS, HX OF    Coronary artery disease    DEPRESSION    DIABETES MELLITUS, TYPE II    Diverticulitis    Epidermal cyst    Fatty liver    GERD    GOUT    History of inguinal hernia    HOH (hard of hearing)    elft ear   HYPERLIPIDEMIA    HYPERTENSION    IBS    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, takedown and reversal September 2012   Ischemic cardiomyopathy 07/24/2018   Left renal mass 05/23/2011   Lumbar radicular pain 06/03/2011   Macular degeneration    right eye   Multinodular goiter    Myocardial infarction (HCC)    silent MI in Pt's 30s   Pancreatic pseudocyst    Stable   Peripheral vascular disease  Peritonsillar abscess    Pulmonary nodule    Right upper lobe   Radial neck fracture    Right upper lobe pulmonary nodule 07/24/2018   Thyroid  disease    Thyroid  nodule    Bilateral   Vertigo     Current Outpatient Medications on File Prior to Visit  Medication Sig Dispense Refill   acetaminophen  (TYLENOL ) 500 MG tablet Take 500-1,000 mg by mouth every 8 (eight) hours as needed for mild pain (pain score 1-3) or moderate pain (pain score 4-6). (Patient taking differently: Take 500-1,000 mg by mouth as needed for mild pain (pain score 1-3) or moderate pain (pain score 4-6).)     alendronate (FOSAMAX) 70 MG tablet Take 70 mg by mouth once a week.     allopurinol  (ZYLOPRIM ) 300 MG tablet Take 300 mg by mouth daily.     aspirin -acetaminophen -caffeine (EXCEDRIN EXTRA STRENGTH) 250-250-65 MG tablet Take 2 tablets by mouth every 8 (eight) hours as needed for headache or migraine. (Patient taking differently: Take 2 tablets by mouth as needed for  headache or migraine.)     atorvastatin  (LIPITOR) 10 MG tablet Take 10 mg by mouth at bedtime.     Blood Pressure Monitoring (OMRON 3 SERIES BP MONITOR) DEVI Use to measure blood pressure as directed. 1 each 0   carvedilol  (COREG ) 6.25 MG tablet Take 1 tablet (6.25 mg total) by mouth 2 (two) times daily. 180 tablet 3   Cholecalciferol (VITAMIN D-3) 25 MCG (1000 UT) CAPS Take 1,000 Units by mouth at bedtime.     cyanocobalamin  (VITAMIN B12) 1000 MCG tablet Take 1,000 mcg by mouth at bedtime.     dapagliflozin  propanediol (FARXIGA ) 10 MG TABS tablet Take 1 tablet (10 mg total) by mouth daily. 90 tablet 1   diphenhydrAMINE  (BENADRYL ) 25 MG tablet Take 25 mg by mouth every 6 (six) hours as needed for allergies. (Patient taking differently: Take 25 mg by mouth as needed for allergies.)     Dulaglutide 1.5 MG/0.5ML SOAJ Inject 1.5 mg into the skin once a week. Monday     famotidine  (PEPCID ) 40 MG tablet Take 40 mg by mouth at bedtime as needed for heartburn or indigestion.     fluticasone (FLONASE) 50 MCG/ACT nasal spray Place 1 spray into both nostrils daily as needed for allergies or rhinitis.     furosemide  (LASIX ) 40 MG tablet Take 1 tablet (40 mg total) by mouth every other day.     gabapentin (NEURONTIN) 100 MG capsule Take 100 mg by mouth at bedtime.     guaiFENesin  (MUCINEX ) 600 MG 12 hr tablet Take 1,200 mg by mouth 2 (two) times daily as needed for cough or to loosen phlegm.     ibuprofen (ADVIL) 200 MG tablet Take 300 mg by mouth every 6 (six) hours as needed for mild pain (pain score 1-3), headache or moderate pain (pain score 4-6). (Patient taking differently: Take 200 mg by mouth as needed for mild pain (pain score 1-3), headache or moderate pain (pain score 4-6).)     insulin  glargine (LANTUS ) 100 UNIT/ML Solostar Pen Inject 32 Units into the skin at bedtime.     levothyroxine  (SYNTHROID ) 175 MCG tablet Take 175 mcg by mouth at bedtime.     loratadine (CLARITIN) 10 MG tablet Take 10 mg by  mouth daily as needed for allergies. (Patient taking differently: Take 10 mg by mouth as needed for allergies.)     Multiple Vitamins-Minerals (PRESERVISION AREDS 2 PO) Take 1 tablet by mouth  2 (two) times daily.     nystatin  (MYCOSTATIN /NYSTOP ) powder Apply 1 Application topically 3 (three) times daily. (Patient taking differently: Apply 1 Application topically as needed.) 180 g 0   omega-3 acid ethyl esters (LOVAZA ) 1 g capsule Take 2 g by mouth 2 (two) times daily.      pantoprazole  (PROTONIX ) 40 MG tablet Take 40 mg by mouth every morning.     rivaroxaban  (XARELTO ) 20 MG TABS tablet Take 1 tablet (20 mg total) by mouth daily with supper. 30 tablet 3   sacubitril -valsartan  (ENTRESTO ) 97-103 MG Take 1 tablet by mouth 2 (two) times daily. 180 tablet 3   sildenafil (VIAGRA) 25 MG tablet SMARTSIG:1-2 Tablet(s) By Mouth PRN     spironolactone  (ALDACTONE ) 25 MG tablet Take 0.5 tablets (12.5 mg total) by mouth daily. 45 tablet 3   tadalafil (CIALIS) 5 MG tablet Take 5 mg by mouth in the morning.     triamcinolone  cream (KENALOG ) 0.1 % Apply 1 application topically 2 (two) times daily as needed (skin irritation).     No current facility-administered medications on file prior to visit.    Allergies  Allergen Reactions   Quinolones Other (See Comments)    Patient with ATAA (ascending thoracic aortic aneurysm) so should avoid this class of antibiotic to avoid increase risk of dissection    Ciprofloxacin  Rash   Escitalopram Oxalate Itching   Gadolinium Derivatives Itching    Pt described sneezing/itchy nose with previous MRI contrast exams, Dr Marthann wants patient to have 13 hour prep for contrast exams; [facility]   Quinapril Hcl Rash   Sulfa Antibiotics Swelling    Swelling in the ankles     Assessment/Plan:  1. CHF -  No problems updated.   No problem-specific Assessment & Plan notes found for this encounter.      Thank you   Robbi Blanch, Pharm.D [facility]IR[facility]. [facility] [address], [place], [place] [address] Phone: [phone]; Fax: [phone]

## 2024-06-08 ENCOUNTER — Ambulatory Visit: Admitting: Pharmacist

## 2024-06-09 MED ORDER — INCOBOTULINUMTOXINA 50 UNITS IM SOLR
50.0000 [IU] | INTRAMUSCULAR | 3 refills | Status: AC
  Filled 2024-06-11: qty 1, 90d supply, fill #0

## 2024-06-09 NOTE — Telephone Encounter (Signed)
 Auth was approved, please send rx to [facility].  Auth#: [id number] (06/04/24-05/20/25)

## 2024-06-09 NOTE — Addendum Note (Signed)
 Addended by: DOUGLASS DELON CROME on: 06/09/2024 11:05 AM   Modules accepted: Orders

## 2024-06-09 NOTE — Telephone Encounter (Signed)
Rx sent to [facility]. 

## 2024-06-10 ENCOUNTER — Other Ambulatory Visit (HOSPITAL_COMMUNITY): Payer: Self-pay

## 2024-06-10 NOTE — Progress Notes (Unsigned)
 Patient ID: Nathan Medina                 DOB: 02/11/1951                      MRN: [id number]     HPI: Nathan Medina is a 74 y.o. male referred by Dr. Elmira  to pharmacy clinic for HF medication management. PMH is significant for  hypertension, CKD stage III, and 2 diabetes mellitus, ischemic cardiomyopathy with recovered EF, coronary artery disease s/p CABGX3 (LIMA-LAD, SVG-dRCA, SVG-ramus) in 2019, paroxysmal A-fib,  stable renal and lung nodules, s/p thyroidectomy, stable thrombocytopenia. Most recent LVEF 40-45 % on 01/2024.  In last visit in November 2025, Entresto  was increased from 49/51 mg twice daily to 97/103 mg twice daily. Lasix  dose was reduced from 40 mg daily to 40 mg every other day. Electrolytes and kidney function stable since these changes were made.    In the past he has a hx of improved EF but due to recent EF drop in Sept 2025, spironolactone  12.5 mg was added to other HF medications (Carvedilol  6.25 mg twice daily, Farxiga  10 mg daily)   Additionally Afib was noted during a visit in August 2025. He was seen by Dr Nancey and underwent afib ablation on 04/07/24. Patient is subsequently on Xarelto  for stroke prevention.   Today the patient is doing well with his medications. However this past week he does note some swelling in his legs for which he is taking Lasix  40mg  daily. Daily lasix  has been helping with the symptoms and he is willing to return to an every other day dosing schedule.   The patient also bought in home BP log. Systolic BP has been ranging in the 120-140s (highest 147 mmHg). Diastolic BP has been ranging in the 60-80s (highest 90 mmHg). Heart rate values were not recorded. The patient did not bring his home blood pressure monitor for validation and reported receiving error messages during measurements a few times.  He also acknowledged inconsistent adherence to a low-sodium diet, therefore the importance of limiting dietary sodium was reinforced. Given  these findings, the patient was advised to bring his blood pressure monitor and an updated log to the next visit for further evaluation as well as to limit sodium intake.  In-office BP was 111/71 mmHg with a heart rate of 69 bpm, therefore no medication adjustments were made at this time.  Symptomatically, he is feeling no dizziness, lightheadedness, and fatigue. No chest pain or palpitations. Does not feel any SOB even if laying down. Able to complete all ADLs. Activity level normal. He does checks his weight at home (normal range 192 - 196 lbs). No PND, or orthopnea. Appetite has been well. He does adheres to a low-salt diet, however this is room for improvement which was reiterated to the patient.  At future visit we may reduce loop diuretics dose and consider optimizing spironolactone  dose to 25 mg daily     Current CHF meds: Carvedilol  6.25 mg twice daily, Farxiga  10 mg daily, Entresto  97/103 mg twice daily, Spironolactone  12.5 mg daily, Lasix  40 mg every other day   BP goal: <130/80   Family History:  Relation Problem Comments  Mother (Deceased at age 2)   Father (Deceased at age 68) Died from MI at age 62   Sister (Alive)   Paternal Aunt Hypertension     Maternal Grandmother Hypertension   Stroke in her 67's  Social History:  Alcohol: rarely  Smoking: none   Diet: Follows low carb and low fat diet.  Cooks meals at home and does add some salt.   Low sodium snacks (pretzels, peanut butter).  Exercise: Gym once a week- balance and strength building Walks -  laps around the house   Home BP readings:   Wt Readings from Last 3 Encounters:  06/04/24 195 lb (88.5 kg)  06/01/24 198 lb (89.8 kg)  05/05/24 201 lb 6.4 oz (91.4 kg)   BP Readings from Last 3 Encounters:  06/11/24 111/71  06/04/24 (!) 119/58  06/01/24 119/82   Pulse Readings from Last 3 Encounters:  06/11/24 69  06/04/24 67  06/01/24 (!) 57    Renal function: CrCl cannot be calculated (Patient's  most recent lab result is older than the maximum 21 days allowed.).  Past Medical History:  Diagnosis Date   Abnormal liver function test 05/23/2011   ALLERGIC RHINITIS    ANXIETY    Aortic atherosclerosis    Arthritis    Ascending aorta dilation 07/24/2018   Bilateral renal cysts    Cervical spondylolysis    Moderate   CHF (congestive heart failure) (HCC) 07/24/2018   Chronic kidney disease    CKD stage 3 per office visit note 08/08/17 on chart    COLONIC POLYPS, HX OF    Coronary artery disease    DEPRESSION    DIABETES MELLITUS, TYPE II    Diverticulitis    Epidermal cyst    Fatty liver    GERD    GOUT    History of inguinal hernia    HOH (hard of hearing)    elft ear   HYPERLIPIDEMIA    HYPERTENSION    IBS    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, takedown and reversal September 2012   Ischemic cardiomyopathy 07/24/2018   Left renal mass 05/23/2011   Lumbar radicular pain 06/03/2011   Macular degeneration    right eye   Multinodular goiter    Myocardial infarction [facility])    silent MI in Pt's 30s   Pancreatic pseudocyst    Stable   Peripheral vascular disease    Peritonsillar abscess    Pulmonary nodule    Right upper lobe   Radial neck fracture    Right upper lobe pulmonary nodule 07/24/2018   Thyroid  disease    Thyroid  nodule    Bilateral   Vertigo     Current Outpatient Medications on File Prior to Visit  Medication Sig Dispense Refill   acetaminophen  (TYLENOL ) 500 MG tablet Take 500-1,000 mg by mouth every 8 (eight) hours as needed for mild pain (pain score 1-3) or moderate pain (pain score 4-6). (Patient taking differently: Take 500-1,000 mg by mouth as needed for mild pain (pain score 1-3) or moderate pain (pain score 4-6).)     alendronate (FOSAMAX) 70 MG tablet Take 70 mg by mouth once a week.     allopurinol  (ZYLOPRIM ) 300 MG tablet Take 300 mg by mouth daily.     aspirin -acetaminophen -caffeine (EXCEDRIN EXTRA  STRENGTH) 250-250-65 MG tablet Take 2 tablets by mouth every 8 (eight) hours as needed for headache or migraine. (Patient taking differently: Take 2 tablets by mouth as needed for headache or migraine.)     atorvastatin  (LIPITOR) 10 MG tablet Take 10 mg by mouth at bedtime.     Blood Pressure Monitoring (OMRON 3 SERIES BP MONITOR) DEVI Use to measure blood pressure as directed. 1  each 0   carvedilol  (COREG ) 6.25 MG tablet Take 1 tablet (6.25 mg total) by mouth 2 (two) times daily. 180 tablet 3   Cholecalciferol (VITAMIN D-3) 25 MCG (1000 UT) CAPS Take 1,000 Units by mouth at bedtime.     cyanocobalamin  (VITAMIN B12) 1000 MCG tablet Take 1,000 mcg by mouth at bedtime.     dapagliflozin  propanediol (FARXIGA ) 10 MG TABS tablet Take 1 tablet (10 mg total) by mouth daily. 90 tablet 1   diphenhydrAMINE  (BENADRYL ) 25 MG tablet Take 25 mg by mouth every 6 (six) hours as needed for allergies. (Patient taking differently: Take 25 mg by mouth as needed for allergies.)     Dulaglutide 1.5 MG/0.5ML SOAJ Inject 1.5 mg into the skin once a week. Monday     famotidine  (PEPCID ) 40 MG tablet Take 40 mg by mouth at bedtime as needed for heartburn or indigestion.     fluticasone (FLONASE) 50 MCG/ACT nasal spray Place 1 spray into both nostrils daily as needed for allergies or rhinitis.     furosemide  (LASIX ) 40 MG tablet Take 1 tablet (40 mg total) by mouth every other day.     gabapentin (NEURONTIN) 100 MG capsule Take 100 mg by mouth at bedtime.     guaiFENesin  (MUCINEX ) 600 MG 12 hr tablet Take 1,200 mg by mouth 2 (two) times daily as needed for cough or to loosen phlegm.     ibuprofen (ADVIL) 200 MG tablet Take 300 mg by mouth every 6 (six) hours as needed for mild pain (pain score 1-3), headache or moderate pain (pain score 4-6). (Patient taking differently: Take 200 mg by mouth as needed for mild pain (pain score 1-3), headache or moderate pain (pain score 4-6).)     incobotulinumtoxinA  (XEOMIN ) 50 units SOLR  injection Inject 50 Units into the muscle every 3 (three) months. 1 each 3   insulin  glargine (LANTUS ) 100 UNIT/ML Solostar Pen Inject 32 Units into the skin at bedtime.     levothyroxine  (SYNTHROID ) 175 MCG tablet Take 175 mcg by mouth at bedtime.     loratadine (CLARITIN) 10 MG tablet Take 10 mg by mouth daily as needed for allergies. (Patient taking differently: Take 10 mg by mouth as needed for allergies.)     Multiple Vitamins-Minerals (PRESERVISION AREDS 2 PO) Take 1 tablet by mouth 2 (two) times daily.     nystatin  (MYCOSTATIN /NYSTOP ) powder Apply 1 Application topically 3 (three) times daily. (Patient taking differently: Apply 1 Application topically as needed.) 180 g 0   omega-3 acid ethyl esters (LOVAZA ) 1 g capsule Take 2 g by mouth 2 (two) times daily.      pantoprazole  (PROTONIX ) 40 MG tablet Take 40 mg by mouth every morning.     rivaroxaban  (XARELTO ) 20 MG TABS tablet Take 1 tablet (20 mg total) by mouth daily with supper. 30 tablet 3   sacubitril -valsartan  (ENTRESTO ) 97-103 MG Take 1 tablet by mouth 2 (two) times daily. 180 tablet 3   sildenafil (VIAGRA) 25 MG tablet SMARTSIG:1-2 Tablet(s) By Mouth PRN     spironolactone  (ALDACTONE ) 25 MG tablet Take 0.5 tablets (12.5 mg total) by mouth daily. 45 tablet 3   tadalafil (CIALIS) 5 MG tablet Take 5 mg by mouth in the morning.     triamcinolone  cream (KENALOG ) 0.1 % Apply 1 application topically 2 (two) times daily as needed (skin irritation).     No current facility-administered medications on file prior to visit.    Allergies  Allergen Reactions   Quinolones  Other (See Comments)    Patient with ATAA (ascending thoracic aortic aneurysm) so should avoid this class of antibiotic to avoid increase risk of dissection    Ciprofloxacin  Rash   Escitalopram Oxalate Itching   Gadolinium Derivatives Itching    Pt described sneezing/itchy nose with previous MRI contrast exams, Dr Marthann wants patient to have 13 hour prep for contrast  exams; [facility]   Quinapril Hcl Rash   Sulfa Antibiotics Swelling    Swelling in the ankles     Assessment/Plan:  1. CHF -  Problem  Hfref (Heart Failure With Reduced Ejection Fraction) (Hcc)    HFrEF (heart failure with reduced ejection fraction) (HCC) Assessment: In office BP today 111/71 heart rate 69. Last LVEF 40-45% (01/2024) BP measurement technique as well as BP monitor was not able to be validated at today's visit.  Symptomatically, he is feeling well, Denies dizziness, lightheadedness, and fatigue. Denies chest pain or palpitations. Able to complete all ADLs. Activity level active. He does check his weight at home. Denies PND, or orthopnea.  Notes swelling in legs for past week for which he has started to take furosemide  40 mg daily. Swelling has now improved   Appetite has been normal. He mostly adheres to a low-salt diet but does acknowledge that there is room for improvement.  Tolerates current HF meds well without any side effects     Plan:  Reduce lasix  dose from 40 mg daily to 40 mg every other day. Can take daily if swelling occurs.  Continue taking Entresto  97/103 mg twice daily, carvedilol  6.25 mg twice daily, Farxiga  10 mg daily,  spironolactone  12.5 mg daily  Patient to keep record of BP readings with heart rate and report to us  at the next visit Patient to see PharmD in 6 weeks for follow up     Thank you,  Sherah Lund, PharmD Candidate    Robbi Blanch, Pharm.D [facility]. [facility] [address], [place], [place] [address] Phone: [phone]; Fax: [phone]

## 2024-06-11 ENCOUNTER — Encounter: Payer: Self-pay | Admitting: Pharmacist

## 2024-06-11 ENCOUNTER — Other Ambulatory Visit: Payer: Self-pay

## 2024-06-11 ENCOUNTER — Ambulatory Visit: Attending: Cardiology | Admitting: Pharmacist

## 2024-06-11 ENCOUNTER — Other Ambulatory Visit: Payer: Self-pay | Admitting: Pharmacy Technician

## 2024-06-11 ENCOUNTER — Other Ambulatory Visit (HOSPITAL_COMMUNITY): Payer: Self-pay

## 2024-06-11 VITALS — BP 111/71 | HR 69

## 2024-06-11 DIAGNOSIS — I5020 Unspecified systolic (congestive) heart failure: Secondary | ICD-10-CM

## 2024-06-11 NOTE — Assessment & Plan Note (Signed)
 Assessment: In office BP today 111/71 heart rate 69. Last LVEF 40-45% (01/2024) BP measurement technique as well as BP monitor was not able to be validated at today's visit.  Symptomatically, he is feeling well, Denies dizziness, lightheadedness, and fatigue. Denies chest pain or palpitations. Able to complete all ADLs. Activity level active. He does check his weight at home. Denies PND, or orthopnea.  Notes swelling in legs for past week for which he has started to take furosemide  40 mg daily. Swelling has now improved   Appetite has been normal. He mostly adheres to a low-salt diet but does acknowledge that there is room for improvement.  Tolerates current HF meds well without any side effects     Plan:  Reduce lasix  dose from 40 mg daily to 40 mg every other day. Can take daily if swelling occurs.  Continue taking Entresto  97/103 mg twice daily, carvedilol  6.25 mg twice daily, Farxiga  10 mg daily,  spironolactone  12.5 mg daily  Patient to keep record of BP readings with heart rate and report to us  at the next visit Patient to see PharmD in 6 weeks for follow up

## 2024-06-17 ENCOUNTER — Ambulatory Visit: Admitting: Cardiovascular Disease

## 2024-06-22 ENCOUNTER — Other Ambulatory Visit (HOSPITAL_COMMUNITY): Payer: Self-pay

## 2024-06-25 ENCOUNTER — Encounter: Payer: Self-pay | Admitting: Cardiology

## 2024-06-25 ENCOUNTER — Ambulatory Visit: Admitting: Cardiology

## 2024-06-25 VITALS — BP 116/69 | HR 66 | Ht 67.0 in | Wt 197.9 lb

## 2024-06-25 DIAGNOSIS — I5020 Unspecified systolic (congestive) heart failure: Secondary | ICD-10-CM | POA: Diagnosis not present

## 2024-06-25 DIAGNOSIS — I2510 Atherosclerotic heart disease of native coronary artery without angina pectoris: Secondary | ICD-10-CM | POA: Diagnosis not present

## 2024-06-25 DIAGNOSIS — I77810 Thoracic aortic ectasia: Secondary | ICD-10-CM | POA: Diagnosis not present

## 2024-06-25 DIAGNOSIS — I480 Paroxysmal atrial fibrillation: Secondary | ICD-10-CM

## 2024-06-25 NOTE — Progress Notes (Signed)
 " Cardiology Office Note:  .   Date:  06/25/2024  ID:  Nathan Medina, DOB 02-09-1951, MRN [id number] PCP: Clarice [place], MD  [facility] Providers Cardiologist:  N[place], MD PCP: Clarice [place], MD  Chief Complaint:  Chief Complaint  Patient presents with   HFrEF      History of Present Illness: .   Nathan Medina is a 74 y.o. male with hypertension, CKD stage III, and 2 diabetes mellitus, ischemic cardiomyopathy with recovered EF, coronary artery disease s/p CABGX3 (LIMA-LAD, SVG-dRCA, SVG-ramus) in 2019, paroxysmal A-fib,  stable renal and lung nodules, s/p thyroidectomy, stable thrombocytopenia  Since his last visit with me, he underwent successful A-fib ablation in 03/2024.  He has noticed improvement in his dyspnea symptoms since then, mild leg edema persist.  He is compliant with his medical therapy.  Vitals:   06/25/24 0924  BP: 116/69  Pulse: 66  SpO2: 97%      ROS:  Review of Systems  Constitutional: Positive for weight gain.  Cardiovascular:  Positive for leg swelling. Negative for chest pain, dyspnea on exertion, palpitations and syncope.     Studies Reviewed: SABRA    EKG 01/06/2024: Sinus rhythm with frequent Premature ventricular complexes Left axis deviation Low voltage QRS Inferior infarct , age undetermined Cannot rule out Anteroseptal infarct (cited on or before 22-Jun-2020) When compared with ECG of 03-Jan-2024 11:54, No significant change was found  Echocardiogram 01/2024:  1. Left ventricular ejection fraction, by estimation, is 40 to 45%. The  left ventricle has mildly decreased function. The left ventricle  demonstrates regional wall motion abnormalities (see scoring  diagram/findings for description). There is mild left  ventricular hypertrophy. Left ventricular diastolic parameters are  consistent with Grade I diastolic dysfunction (impaired relaxation).   2. Right ventricular systolic function is mildly reduced. The right   ventricular size is normal. Tricuspid regurgitation signal is inadequate  for assessing PA pressure.   3. Left atrial size was mildly dilated.   4. The mitral valve is normal in structure. Trivial mitral valve  regurgitation. No evidence of mitral stenosis.   5. The aortic valve was not well visualized. Aortic valve regurgitation  is trivial. No aortic stenosis is present.   6. Aortic dilatation noted. There is mild dilatation of the ascending  aorta, measuring 39 mm.   7. The inferior vena cava is normal in size with greater than 50%  respiratory variability, suggesting right atrial pressure of 3 mmHg.   CT chest 04/2023: 1. The multiple part solid nodules seen in the right lung on the most recent prior study have resolved consistent with a resolved inflammatory/infectious process. No new or enlarging pulmonary nodules. 2. Stable chronic pure ground-glass nodule posteriorly in the right upper lobe. This demonstrates slow growth from 2019, although demonstrates no solid components. Continued annual CT follow-up suggested. 3. No evidence of metastatic disease. 4. Cholelithiasis. 5. 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 6.  Aortic Atherosclerosis (ICD10-I70.0).  Independently interpreted 04/2024: Cr 1.27, eGFR 59, Na 146  03/2024: ProBNP 863  Labs 03/18/2024: Hb 16.9 Cr 1.91, eGFR 37 ProBNP 582  02/25/2024: Cr 1.49, eGFR 49  12/2023: ProBNP 1625  Labs 1-12/2023: Chol 94, TG 112, HDL 33, LDL 40 HbA1C 6.8% Hb 17  Physical Exam:   Physical Exam Vitals and nursing note reviewed.  Constitutional:      General: He is not in acute distress. Neck:     Vascular: No JVD.  Cardiovascular:  Rate and Rhythm: Normal rate and regular rhythm.     Heart sounds: Normal heart sounds. No murmur heard. Pulmonary:     Effort: Pulmonary effort is normal.     Breath sounds: Normal breath sounds. No wheezing or rales.  Musculoskeletal:     Right  lower leg: Edema (1+) present.     Left lower leg: Edema (1+) present.      VISIT DIAGNOSES:   ICD-10-CM   1. HFrEF (heart failure with reduced ejection fraction) (HCC)  I50.20 ECHOCARDIOGRAM COMPLETE    2. Coronary artery disease involving native coronary artery of native heart without angina pectoris  I25.10     3. Paroxysmal A-fib (HCC)  I48.0     4. Ascending aorta dilation  I77.810          ASSESSMENT AND PLAN: .    Nathan Medina is a 74 y.o. male with hypertension, CKD stage III, and 2 diabetes mellitus, ischemic cardiomyopathy with recovered EF, coronary artery disease s/p CABGX3 (LIMA-LAD, SVG-dRCA, SVG-ramus) in 2019, paroxysmal A-fib,  stable renal and lung nodules, s/p thyroidectomy, stable thrombocytopenia  HFrEF: Recent drop in LVEF to 40-45% (01/2024) with NYHA class II symptoms with leg edema. Paroxysmal A-fib could be contributing, for which he is going to undergo ablation soon. Continue GDMT uptitration as tolerated. Continue Farxiga  from 5 mg daily to 10 mg daily, Entresto  97-103 mg twice daily, Coreg  6.25 mg twice daily, Lasix  40 mg daily. Check echocardiogram, anticipate EF would be improved since A-fib ablation.  Paroxysmal A-fib: Currently in sinus rhythm. Continue Coreg  6.25 mg twice daily. Continue Xarelto  20 mg daily (creatinine clearance now >50).   CAD: S/p CABG. No angina symptoms. Not on aspirin  given ongoing use of Xarelto .   Hyperlipidemia: Well controlled. TG have also improved. Continue lipitor 10 mg daily.    Thrombocytopenia: Mild, stable. No bleeding. Continue f/u w/PCP.   Ascending aorta aneurysm: Stable, 4 cm.  Continue follow-up with CVTS.     F/u in 6 months  Signed, Nathan JINNY [place], MD  "

## 2024-06-25 NOTE — Patient Instructions (Signed)
" °  Testing/Procedures: Echocardiogram  Your physician has requested that you have an echocardiogram. Echocardiography is a painless test that uses sound waves to create images of your heart. It provides your doctor with information about the size and shape of your heart and how well your hearts chambers and valves are working. This procedure takes approximately one hour. There are no restrictions for this procedure. Please do NOT wear cologne, perfume, aftershave, or lotions (deodorant is allowed). Please arrive 15 minutes prior to your appointment time.  Please note: We ask at that you not bring children with you during ultrasound (echo/ vascular) testing. Due to room size and safety concerns, children are not allowed in the ultrasound rooms during exams. Our front office staff cannot provide observation of children in our lobby area while testing is being conducted. An adult accompanying a patient to their appointment will only be allowed in the ultrasound room at the discretion of the ultrasound technician under special circumstances. We apologize for any inconvenience.   Follow-Up: At [facility], you and your health needs are our priority.  As part of our continuing mission to provide you with exceptional heart care, our providers are all part of one team.  This team includes your primary Cardiologist (physician) and Advanced Practice Providers or APPs (Physician Assistants and Nurse Practitioners) who all work together to provide you with the care you need, when you need it.  Your next appointment:   6 month(s)  Provider:   Newman JINNY [place], MD    We recommend signing up for the patient portal called MyChart.  Sign up information is provided on this After Visit Summary.  MyChart is used to connect with patients for Virtual Visits (Telemedicine).  Patients are able to view lab/test results, encounter notes, upcoming appointments, etc.  Non-urgent messages can be sent to your  provider as well.   To learn more about what you can do with MyChart, go to [email or web address].   Other Instructions       .insts   "

## 2024-07-01 ENCOUNTER — Ambulatory Visit: Admitting: Neurology

## 2024-07-10 ENCOUNTER — Ambulatory Visit: Admitting: Cardiovascular Disease

## 2024-07-23 ENCOUNTER — Ambulatory Visit: Admitting: Pharmacist

## 2024-08-04 ENCOUNTER — Ambulatory Visit (HOSPITAL_COMMUNITY)

## 2025-02-10 ENCOUNTER — Ambulatory Visit: Admitting: Hematology

## 2025-02-10 ENCOUNTER — Other Ambulatory Visit
